# Patient Record
Sex: Female | Born: 1945 | Race: Black or African American | Hispanic: No | Smoking: Former smoker
Health system: Southern US, Community
[De-identification: ages and names within clinical notes are randomized; demographics above are authoritative.]

## PROBLEM LIST (undated history)

## (undated) DIAGNOSIS — E039 Hypothyroidism, unspecified: Secondary | ICD-10-CM

## (undated) DIAGNOSIS — J40 Bronchitis, not specified as acute or chronic: Secondary | ICD-10-CM

## (undated) DIAGNOSIS — F419 Anxiety disorder, unspecified: Secondary | ICD-10-CM

## (undated) DIAGNOSIS — K759 Inflammatory liver disease, unspecified: Secondary | ICD-10-CM

## (undated) DIAGNOSIS — M1990 Unspecified osteoarthritis, unspecified site: Secondary | ICD-10-CM

## (undated) DIAGNOSIS — IMO0001 Reserved for inherently not codable concepts without codable children: Secondary | ICD-10-CM

## (undated) DIAGNOSIS — Z8719 Personal history of other diseases of the digestive system: Secondary | ICD-10-CM

## (undated) DIAGNOSIS — R011 Cardiac murmur, unspecified: Secondary | ICD-10-CM

## (undated) DIAGNOSIS — J189 Pneumonia, unspecified organism: Secondary | ICD-10-CM

## (undated) DIAGNOSIS — R519 Headache, unspecified: Secondary | ICD-10-CM

## (undated) DIAGNOSIS — D649 Anemia, unspecified: Secondary | ICD-10-CM

## (undated) DIAGNOSIS — K219 Gastro-esophageal reflux disease without esophagitis: Secondary | ICD-10-CM

## (undated) DIAGNOSIS — I10 Essential (primary) hypertension: Secondary | ICD-10-CM

## (undated) DIAGNOSIS — Z5189 Encounter for other specified aftercare: Secondary | ICD-10-CM

## (undated) DIAGNOSIS — N189 Chronic kidney disease, unspecified: Secondary | ICD-10-CM

## (undated) HISTORY — PX: HERNIA REPAIR: SHX51

## (undated) HISTORY — PX: TONSILLECTOMY: SUR1361

## (undated) HISTORY — PX: BREAST SURGERY: SHX581

## (undated) HISTORY — PX: FRACTURE SURGERY: SHX138

## (undated) HISTORY — PX: BUNIONECTOMY: SHX129

## (undated) HISTORY — PX: OTHER SURGICAL HISTORY: SHX169

## (undated) HISTORY — PX: DILATION AND CURETTAGE OF UTERUS: SHX78

## (undated) HISTORY — PX: NASAL SEPTUM SURGERY: SHX37

## (undated) HISTORY — PX: APPENDECTOMY: SHX54

## (undated) HISTORY — PX: BREAST CYST EXCISION: SHX579

## (undated) HISTORY — PX: CHOLECYSTECTOMY: SHX55

## (undated) HISTORY — PX: BREAST CYST INCISION AND DRAINAGE: SHX14

## (undated) HISTORY — PX: BACK SURGERY: SHX140

## (undated) HISTORY — PX: OVARIAN CYST SURGERY: SHX726

## (undated) HISTORY — PX: ABDOMINAL HYSTERECTOMY: SHX81

---

## 1998-01-21 ENCOUNTER — Encounter
Admission: RE | Admit: 1998-01-21 | Discharge: 1998-04-21 | Payer: Self-pay | Admitting: Physical Medicine and Rehabilitation

## 1998-02-18 ENCOUNTER — Ambulatory Visit (HOSPITAL_COMMUNITY)
Admission: RE | Admit: 1998-02-18 | Discharge: 1998-02-18 | Payer: Self-pay | Admitting: Physical Medicine and Rehabilitation

## 1998-02-20 ENCOUNTER — Ambulatory Visit (HOSPITAL_COMMUNITY)
Admission: RE | Admit: 1998-02-20 | Discharge: 1998-02-20 | Payer: Self-pay | Admitting: Physical Medicine and Rehabilitation

## 1998-02-25 ENCOUNTER — Ambulatory Visit (HOSPITAL_COMMUNITY)
Admission: RE | Admit: 1998-02-25 | Discharge: 1998-02-25 | Payer: Self-pay | Admitting: Physical Medicine and Rehabilitation

## 1998-02-27 ENCOUNTER — Ambulatory Visit (HOSPITAL_BASED_OUTPATIENT_CLINIC_OR_DEPARTMENT_OTHER): Admission: RE | Admit: 1998-02-27 | Discharge: 1998-02-27 | Payer: Self-pay | Admitting: General Surgery

## 1998-05-28 ENCOUNTER — Ambulatory Visit (HOSPITAL_COMMUNITY)
Admission: RE | Admit: 1998-05-28 | Discharge: 1998-05-28 | Payer: Self-pay | Admitting: Physical Medicine and Rehabilitation

## 1998-11-01 ENCOUNTER — Encounter: Payer: Self-pay | Admitting: Physical Medicine and Rehabilitation

## 1998-11-01 ENCOUNTER — Ambulatory Visit (HOSPITAL_COMMUNITY)
Admission: RE | Admit: 1998-11-01 | Discharge: 1998-11-01 | Payer: Self-pay | Admitting: Physical Medicine and Rehabilitation

## 1998-11-29 ENCOUNTER — Inpatient Hospital Stay (HOSPITAL_COMMUNITY): Admission: RE | Admit: 1998-11-29 | Discharge: 1998-12-01 | Payer: Self-pay | Admitting: Obstetrics and Gynecology

## 1999-05-22 ENCOUNTER — Ambulatory Visit (HOSPITAL_COMMUNITY)
Admission: RE | Admit: 1999-05-22 | Discharge: 1999-05-22 | Payer: Self-pay | Admitting: Physical Medicine and Rehabilitation

## 1999-05-22 ENCOUNTER — Encounter: Payer: Self-pay | Admitting: Physical Medicine and Rehabilitation

## 1999-11-23 ENCOUNTER — Emergency Department (HOSPITAL_COMMUNITY): Admission: EM | Admit: 1999-11-23 | Discharge: 1999-11-23 | Payer: Self-pay | Admitting: Emergency Medicine

## 1999-11-23 ENCOUNTER — Encounter: Payer: Self-pay | Admitting: Emergency Medicine

## 2002-12-27 ENCOUNTER — Other Ambulatory Visit: Admission: RE | Admit: 2002-12-27 | Discharge: 2002-12-27 | Payer: Self-pay | Admitting: Obstetrics and Gynecology

## 2003-07-30 ENCOUNTER — Encounter: Payer: Self-pay | Admitting: Emergency Medicine

## 2003-07-30 ENCOUNTER — Observation Stay (HOSPITAL_COMMUNITY): Admission: EM | Admit: 2003-07-30 | Discharge: 2003-08-01 | Payer: Self-pay

## 2003-07-31 ENCOUNTER — Encounter: Payer: Self-pay | Admitting: Cardiology

## 2004-11-19 ENCOUNTER — Other Ambulatory Visit: Admission: RE | Admit: 2004-11-19 | Discharge: 2004-11-19 | Payer: Self-pay | Admitting: Obstetrics and Gynecology

## 2005-01-20 ENCOUNTER — Emergency Department (HOSPITAL_COMMUNITY): Admission: EM | Admit: 2005-01-20 | Discharge: 2005-01-20 | Payer: Self-pay | Admitting: Emergency Medicine

## 2005-01-22 ENCOUNTER — Ambulatory Visit (HOSPITAL_COMMUNITY): Admission: RE | Admit: 2005-01-22 | Discharge: 2005-01-22 | Payer: Self-pay | Admitting: Gastroenterology

## 2005-01-22 ENCOUNTER — Encounter (INDEPENDENT_AMBULATORY_CARE_PROVIDER_SITE_OTHER): Payer: Self-pay | Admitting: Specialist

## 2005-02-10 ENCOUNTER — Ambulatory Visit (HOSPITAL_COMMUNITY): Admission: RE | Admit: 2005-02-10 | Discharge: 2005-02-10 | Payer: Self-pay | Admitting: Gastroenterology

## 2005-02-24 ENCOUNTER — Encounter: Admission: RE | Admit: 2005-02-24 | Discharge: 2005-02-24 | Payer: Self-pay | Admitting: Nephrology

## 2007-09-16 ENCOUNTER — Encounter: Admission: RE | Admit: 2007-09-16 | Discharge: 2007-09-16 | Payer: Self-pay | Admitting: Obstetrics and Gynecology

## 2007-09-29 ENCOUNTER — Encounter: Admission: RE | Admit: 2007-09-29 | Discharge: 2007-09-29 | Payer: Self-pay | Admitting: Obstetrics and Gynecology

## 2007-12-18 ENCOUNTER — Ambulatory Visit (HOSPITAL_COMMUNITY): Admission: EM | Admit: 2007-12-18 | Discharge: 2007-12-18 | Payer: Self-pay | Admitting: Emergency Medicine

## 2007-12-31 ENCOUNTER — Emergency Department (HOSPITAL_COMMUNITY): Admission: EM | Admit: 2007-12-31 | Discharge: 2007-12-31 | Payer: Self-pay | Admitting: Emergency Medicine

## 2008-02-29 ENCOUNTER — Ambulatory Visit (HOSPITAL_COMMUNITY): Admission: RE | Admit: 2008-02-29 | Discharge: 2008-02-29 | Payer: Self-pay | Admitting: Nephrology

## 2008-03-09 ENCOUNTER — Encounter (HOSPITAL_COMMUNITY): Admission: RE | Admit: 2008-03-09 | Discharge: 2008-05-21 | Payer: Self-pay | Admitting: Nephrology

## 2008-04-27 ENCOUNTER — Encounter: Admission: RE | Admit: 2008-04-27 | Discharge: 2008-04-27 | Payer: Self-pay | Admitting: General Surgery

## 2008-12-13 ENCOUNTER — Inpatient Hospital Stay (HOSPITAL_COMMUNITY): Admission: AD | Admit: 2008-12-13 | Discharge: 2008-12-30 | Payer: Self-pay | Admitting: Surgery

## 2008-12-13 ENCOUNTER — Encounter: Admission: RE | Admit: 2008-12-13 | Discharge: 2008-12-13 | Payer: Self-pay | Admitting: Internal Medicine

## 2008-12-13 ENCOUNTER — Encounter (INDEPENDENT_AMBULATORY_CARE_PROVIDER_SITE_OTHER): Payer: Self-pay | Admitting: Surgery

## 2008-12-31 ENCOUNTER — Ambulatory Visit: Payer: Self-pay | Admitting: Vascular Surgery

## 2009-01-12 ENCOUNTER — Inpatient Hospital Stay (HOSPITAL_COMMUNITY): Admission: EM | Admit: 2009-01-12 | Discharge: 2009-01-20 | Payer: Self-pay | Admitting: Emergency Medicine

## 2009-03-21 ENCOUNTER — Encounter: Admission: RE | Admit: 2009-03-21 | Discharge: 2009-03-21 | Payer: Self-pay | Admitting: Orthopaedic Surgery

## 2009-06-10 ENCOUNTER — Encounter (HOSPITAL_COMMUNITY): Admission: RE | Admit: 2009-06-10 | Discharge: 2009-07-09 | Payer: Self-pay | Admitting: Nephrology

## 2009-09-23 ENCOUNTER — Ambulatory Visit: Admission: AD | Admit: 2009-09-23 | Discharge: 2009-09-23 | Payer: Self-pay | Admitting: Internal Medicine

## 2010-08-22 ENCOUNTER — Encounter: Admission: RE | Admit: 2010-08-22 | Discharge: 2010-08-22 | Payer: Self-pay | Admitting: Specialist

## 2010-08-25 ENCOUNTER — Inpatient Hospital Stay (HOSPITAL_COMMUNITY): Admission: RE | Admit: 2010-08-25 | Discharge: 2010-08-30 | Payer: Self-pay | Admitting: Specialist

## 2010-09-09 ENCOUNTER — Inpatient Hospital Stay (HOSPITAL_COMMUNITY)
Admission: EM | Admit: 2010-09-09 | Discharge: 2010-09-16 | Payer: Self-pay | Source: Home / Self Care | Admitting: Emergency Medicine

## 2010-09-16 ENCOUNTER — Encounter (INDEPENDENT_AMBULATORY_CARE_PROVIDER_SITE_OTHER): Payer: Self-pay | Admitting: Specialist

## 2010-10-07 ENCOUNTER — Ambulatory Visit: Admit: 2010-10-07 | Payer: Self-pay | Admitting: Vascular Surgery

## 2010-10-07 ENCOUNTER — Ambulatory Visit (HOSPITAL_COMMUNITY)
Admission: RE | Admit: 2010-10-07 | Discharge: 2010-10-07 | Payer: Self-pay | Source: Home / Self Care | Attending: Internal Medicine | Admitting: Internal Medicine

## 2010-10-07 ENCOUNTER — Encounter (INDEPENDENT_AMBULATORY_CARE_PROVIDER_SITE_OTHER): Payer: Self-pay | Admitting: Internal Medicine

## 2010-11-05 ENCOUNTER — Emergency Department (HOSPITAL_COMMUNITY)
Admission: EM | Admit: 2010-11-05 | Discharge: 2010-11-06 | Payer: Self-pay | Source: Home / Self Care | Admitting: Emergency Medicine

## 2010-11-06 LAB — DIFFERENTIAL
Basophils Absolute: 0 10*3/uL (ref 0.0–0.1)
Basophils Relative: 0 % (ref 0–1)
Eosinophils Absolute: 0.1 10*3/uL (ref 0.0–0.7)
Eosinophils Relative: 1 % (ref 0–5)
Lymphocytes Relative: 20 % (ref 12–46)
Lymphs Abs: 2.4 10*3/uL (ref 0.7–4.0)
Monocytes Absolute: 0.8 10*3/uL (ref 0.1–1.0)
Monocytes Relative: 7 % (ref 3–12)
Neutro Abs: 8.6 10*3/uL — ABNORMAL HIGH (ref 1.7–7.7)
Neutrophils Relative %: 72 % (ref 43–77)

## 2010-11-06 LAB — CBC
HCT: 34.2 % — ABNORMAL LOW (ref 36.0–46.0)
Hemoglobin: 11.5 g/dL — ABNORMAL LOW (ref 12.0–15.0)
MCH: 28.4 pg (ref 26.0–34.0)
MCHC: 33.6 g/dL (ref 30.0–36.0)
MCV: 84.4 fL (ref 78.0–100.0)
Platelets: 323 10*3/uL (ref 150–400)
RBC: 4.05 MIL/uL (ref 3.87–5.11)
RDW: 14.3 % (ref 11.5–15.5)
WBC: 11.9 10*3/uL — ABNORMAL HIGH (ref 4.0–10.5)

## 2010-11-06 LAB — COMPREHENSIVE METABOLIC PANEL
ALT: 13 U/L (ref 0–35)
AST: 29 U/L (ref 0–37)
Albumin: 3.3 g/dL — ABNORMAL LOW (ref 3.5–5.2)
Alkaline Phosphatase: 87 U/L (ref 39–117)
BUN: 10 mg/dL (ref 6–23)
CO2: 27 mEq/L (ref 19–32)
Calcium: 8.7 mg/dL (ref 8.4–10.5)
Chloride: 99 mEq/L (ref 96–112)
Creatinine, Ser: 1.12 mg/dL (ref 0.4–1.2)
GFR calc Af Amer: 60 mL/min — ABNORMAL LOW (ref >60)
GFR calc non Af Amer: 49 mL/min — ABNORMAL LOW (ref >60)
Glucose, Bld: 123 mg/dL — ABNORMAL HIGH (ref 70–99)
Potassium: 3.2 mEq/L — ABNORMAL LOW (ref 3.5–5.1)
Sodium: 137 mEq/L (ref 135–145)
Total Bilirubin: 0.9 mg/dL (ref 0.3–1.2)
Total Protein: 7 g/dL (ref 6.0–8.3)

## 2010-11-06 LAB — URINALYSIS, ROUTINE W REFLEX MICROSCOPIC
Bilirubin Urine: NEGATIVE
Hgb urine dipstick: NEGATIVE
Ketones, ur: NEGATIVE mg/dL
Nitrite: NEGATIVE
Protein, ur: NEGATIVE mg/dL
Specific Gravity, Urine: 1.011 (ref 1.005–1.030)
Urine Glucose, Fasting: NEGATIVE mg/dL
Urobilinogen, UA: 0.2 mg/dL (ref 0.0–1.0)
pH: 8 (ref 5.0–8.0)

## 2010-11-06 LAB — URINE MICROSCOPIC-ADD ON

## 2010-11-06 LAB — LIPASE, BLOOD: Lipase: 68 U/L — ABNORMAL HIGH (ref 11–59)

## 2010-12-23 LAB — GLUCOSE, CAPILLARY
Glucose-Capillary: 101 mg/dL — ABNORMAL HIGH (ref 70–99)
Glucose-Capillary: 101 mg/dL — ABNORMAL HIGH (ref 70–99)
Glucose-Capillary: 102 mg/dL — ABNORMAL HIGH (ref 70–99)
Glucose-Capillary: 102 mg/dL — ABNORMAL HIGH (ref 70–99)
Glucose-Capillary: 105 mg/dL — ABNORMAL HIGH (ref 70–99)
Glucose-Capillary: 110 mg/dL — ABNORMAL HIGH (ref 70–99)
Glucose-Capillary: 113 mg/dL — ABNORMAL HIGH (ref 70–99)
Glucose-Capillary: 114 mg/dL — ABNORMAL HIGH (ref 70–99)
Glucose-Capillary: 115 mg/dL — ABNORMAL HIGH (ref 70–99)
Glucose-Capillary: 116 mg/dL — ABNORMAL HIGH (ref 70–99)
Glucose-Capillary: 116 mg/dL — ABNORMAL HIGH (ref 70–99)
Glucose-Capillary: 120 mg/dL — ABNORMAL HIGH (ref 70–99)
Glucose-Capillary: 121 mg/dL — ABNORMAL HIGH (ref 70–99)
Glucose-Capillary: 125 mg/dL — ABNORMAL HIGH (ref 70–99)
Glucose-Capillary: 128 mg/dL — ABNORMAL HIGH (ref 70–99)
Glucose-Capillary: 132 mg/dL — ABNORMAL HIGH (ref 70–99)
Glucose-Capillary: 133 mg/dL — ABNORMAL HIGH (ref 70–99)
Glucose-Capillary: 141 mg/dL — ABNORMAL HIGH (ref 70–99)
Glucose-Capillary: 147 mg/dL — ABNORMAL HIGH (ref 70–99)
Glucose-Capillary: 181 mg/dL — ABNORMAL HIGH (ref 70–99)
Glucose-Capillary: 99 mg/dL (ref 70–99)

## 2010-12-23 LAB — BASIC METABOLIC PANEL
BUN: 5 mg/dL — ABNORMAL LOW (ref 6–23)
BUN: 5 mg/dL — ABNORMAL LOW (ref 6–23)
CO2: 22 mEq/L (ref 19–32)
CO2: 24 mEq/L (ref 19–32)
Calcium: 8.4 mg/dL (ref 8.4–10.5)
Calcium: 9.2 mg/dL (ref 8.4–10.5)
Chloride: 102 mEq/L (ref 96–112)
Chloride: 109 mEq/L (ref 96–112)
Creatinine, Ser: 1.27 mg/dL — ABNORMAL HIGH (ref 0.4–1.2)
Creatinine, Ser: 1.31 mg/dL — ABNORMAL HIGH (ref 0.4–1.2)
GFR calc Af Amer: 50 mL/min — ABNORMAL LOW (ref >60)
GFR calc Af Amer: 52 mL/min — ABNORMAL LOW (ref >60)
GFR calc non Af Amer: 41 mL/min — ABNORMAL LOW (ref >60)
GFR calc non Af Amer: 43 mL/min — ABNORMAL LOW (ref >60)
Glucose, Bld: 104 mg/dL — ABNORMAL HIGH (ref 70–99)
Glucose, Bld: 131 mg/dL — ABNORMAL HIGH (ref 70–99)
Potassium: 4 mEq/L (ref 3.5–5.1)
Potassium: 4.2 mEq/L (ref 3.5–5.1)
Sodium: 135 mEq/L (ref 135–145)
Sodium: 135 mEq/L (ref 135–145)

## 2010-12-23 LAB — URINE MICROSCOPIC-ADD ON

## 2010-12-23 LAB — URINALYSIS, ROUTINE W REFLEX MICROSCOPIC
Bilirubin Urine: NEGATIVE
Glucose, UA: NEGATIVE mg/dL
Ketones, ur: NEGATIVE mg/dL
Nitrite: NEGATIVE
Protein, ur: NEGATIVE mg/dL
Specific Gravity, Urine: 1.006 (ref 1.005–1.030)
Urobilinogen, UA: 0.2 mg/dL (ref 0.0–1.0)
pH: 7.5 (ref 5.0–8.0)

## 2010-12-23 LAB — ANAEROBIC CULTURE

## 2010-12-23 LAB — WOUND CULTURE: Culture: NO GROWTH

## 2010-12-23 LAB — CBC
HCT: 30.3 % — ABNORMAL LOW (ref 36.0–46.0)
HCT: 34 % — ABNORMAL LOW (ref 36.0–46.0)
Hemoglobin: 10.9 g/dL — ABNORMAL LOW (ref 12.0–15.0)
Hemoglobin: 9.7 g/dL — ABNORMAL LOW (ref 12.0–15.0)
MCH: 28.8 pg (ref 26.0–34.0)
MCH: 29.3 pg (ref 26.0–34.0)
MCHC: 32 g/dL (ref 30.0–36.0)
MCHC: 32.1 g/dL (ref 30.0–36.0)
MCV: 89.7 fL (ref 78.0–100.0)
MCV: 91.5 fL (ref 78.0–100.0)
Platelets: 354 10*3/uL (ref 150–400)
Platelets: 390 10*3/uL (ref 150–400)
RBC: 3.31 MIL/uL — ABNORMAL LOW (ref 3.87–5.11)
RBC: 3.79 MIL/uL — ABNORMAL LOW (ref 3.87–5.11)
RDW: 14 % (ref 11.5–15.5)
RDW: 14.6 % (ref 11.5–15.5)
WBC: 10.6 10*3/uL — ABNORMAL HIGH (ref 4.0–10.5)
WBC: 9.8 10*3/uL (ref 4.0–10.5)

## 2010-12-24 LAB — URINE CULTURE
Colony Count: >=100000
Culture  Setup Time: 201111181817

## 2010-12-24 LAB — GLUCOSE, CAPILLARY
Glucose-Capillary: 101 mg/dL — ABNORMAL HIGH (ref 70–99)
Glucose-Capillary: 106 mg/dL — ABNORMAL HIGH (ref 70–99)
Glucose-Capillary: 106 mg/dL — ABNORMAL HIGH (ref 70–99)
Glucose-Capillary: 111 mg/dL — ABNORMAL HIGH (ref 70–99)
Glucose-Capillary: 112 mg/dL — ABNORMAL HIGH (ref 70–99)
Glucose-Capillary: 114 mg/dL — ABNORMAL HIGH (ref 70–99)
Glucose-Capillary: 114 mg/dL — ABNORMAL HIGH (ref 70–99)
Glucose-Capillary: 114 mg/dL — ABNORMAL HIGH (ref 70–99)
Glucose-Capillary: 123 mg/dL — ABNORMAL HIGH (ref 70–99)
Glucose-Capillary: 127 mg/dL — ABNORMAL HIGH (ref 70–99)
Glucose-Capillary: 127 mg/dL — ABNORMAL HIGH (ref 70–99)
Glucose-Capillary: 128 mg/dL — ABNORMAL HIGH (ref 70–99)
Glucose-Capillary: 133 mg/dL — ABNORMAL HIGH (ref 70–99)
Glucose-Capillary: 137 mg/dL — ABNORMAL HIGH (ref 70–99)
Glucose-Capillary: 139 mg/dL — ABNORMAL HIGH (ref 70–99)
Glucose-Capillary: 139 mg/dL — ABNORMAL HIGH (ref 70–99)
Glucose-Capillary: 139 mg/dL — ABNORMAL HIGH (ref 70–99)
Glucose-Capillary: 143 mg/dL — ABNORMAL HIGH (ref 70–99)
Glucose-Capillary: 147 mg/dL — ABNORMAL HIGH (ref 70–99)
Glucose-Capillary: 153 mg/dL — ABNORMAL HIGH (ref 70–99)
Glucose-Capillary: 154 mg/dL — ABNORMAL HIGH (ref 70–99)
Glucose-Capillary: 155 mg/dL — ABNORMAL HIGH (ref 70–99)
Glucose-Capillary: 156 mg/dL — ABNORMAL HIGH (ref 70–99)
Glucose-Capillary: 157 mg/dL — ABNORMAL HIGH (ref 70–99)
Glucose-Capillary: 93 mg/dL (ref 70–99)
Glucose-Capillary: 99 mg/dL (ref 70–99)

## 2010-12-24 LAB — CBC
HCT: 26.7 % — ABNORMAL LOW (ref 36.0–46.0)
HCT: 27.1 % — ABNORMAL LOW (ref 36.0–46.0)
HCT: 28 % — ABNORMAL LOW (ref 36.0–46.0)
HCT: 30.8 % — ABNORMAL LOW (ref 36.0–46.0)
HCT: 36.9 % (ref 36.0–46.0)
HCT: 38.8 % (ref 36.0–46.0)
Hemoglobin: 12 g/dL (ref 12.0–15.0)
Hemoglobin: 12.8 g/dL (ref 12.0–15.0)
Hemoglobin: 8.5 g/dL — ABNORMAL LOW (ref 12.0–15.0)
Hemoglobin: 8.7 g/dL — ABNORMAL LOW (ref 12.0–15.0)
Hemoglobin: 9.1 g/dL — ABNORMAL LOW (ref 12.0–15.0)
Hemoglobin: 9.9 g/dL — ABNORMAL LOW (ref 12.0–15.0)
MCH: 29 pg (ref 26.0–34.0)
MCH: 29 pg (ref 26.0–34.0)
MCH: 29.1 pg (ref 26.0–34.0)
MCH: 29.3 pg (ref 26.0–34.0)
MCH: 29.6 pg (ref 26.0–34.0)
MCH: 29.9 pg (ref 26.0–34.0)
MCHC: 31.8 g/dL (ref 30.0–36.0)
MCHC: 32.1 g/dL (ref 30.0–36.0)
MCHC: 32.1 g/dL (ref 30.0–36.0)
MCHC: 32.5 g/dL (ref 30.0–36.0)
MCHC: 32.5 g/dL (ref 30.0–36.0)
MCHC: 33 g/dL (ref 30.0–36.0)
MCV: 89.6 fL (ref 78.0–100.0)
MCV: 90.2 fL (ref 78.0–100.0)
MCV: 90.3 fL (ref 78.0–100.0)
MCV: 90.6 fL (ref 78.0–100.0)
MCV: 91.1 fL (ref 78.0–100.0)
MCV: 92.1 fL (ref 78.0–100.0)
Platelets: 170 10*3/uL (ref 150–400)
Platelets: 218 10*3/uL (ref 150–400)
Platelets: 223 10*3/uL (ref 150–400)
Platelets: 299 10*3/uL (ref 150–400)
Platelets: 308 10*3/uL (ref 150–400)
Platelets: 417 10*3/uL — ABNORMAL HIGH (ref 150–400)
RBC: 2.93 MIL/uL — ABNORMAL LOW (ref 3.87–5.11)
RBC: 3 MIL/uL — ABNORMAL LOW (ref 3.87–5.11)
RBC: 3.04 MIL/uL — ABNORMAL LOW (ref 3.87–5.11)
RBC: 3.4 MIL/uL — ABNORMAL LOW (ref 3.87–5.11)
RBC: 4.09 MIL/uL (ref 3.87–5.11)
RBC: 4.33 MIL/uL (ref 3.87–5.11)
RDW: 14.3 % (ref 11.5–15.5)
RDW: 14.5 % (ref 11.5–15.5)
RDW: 14.7 % (ref 11.5–15.5)
RDW: 14.7 % (ref 11.5–15.5)
RDW: 14.9 % (ref 11.5–15.5)
RDW: 15 % (ref 11.5–15.5)
WBC: 12.6 10*3/uL — ABNORMAL HIGH (ref 4.0–10.5)
WBC: 13.5 10*3/uL — ABNORMAL HIGH (ref 4.0–10.5)
WBC: 13.9 10*3/uL — ABNORMAL HIGH (ref 4.0–10.5)
WBC: 14.6 10*3/uL — ABNORMAL HIGH (ref 4.0–10.5)
WBC: 8.9 10*3/uL (ref 4.0–10.5)
WBC: 9.3 10*3/uL (ref 4.0–10.5)

## 2010-12-24 LAB — HEMOGLOBIN A1C
Hgb A1c MFr Bld: 6.8 % — ABNORMAL HIGH (ref <5.7)
Hgb A1c MFr Bld: 6.8 % — ABNORMAL HIGH (ref <5.7)
Mean Plasma Glucose: 148 mg/dL — ABNORMAL HIGH (ref <117)
Mean Plasma Glucose: 148 mg/dL — ABNORMAL HIGH (ref <117)

## 2010-12-24 LAB — DIFFERENTIAL
Basophils Absolute: 0 10*3/uL (ref 0.0–0.1)
Basophils Absolute: 0 10*3/uL (ref 0.0–0.1)
Basophils Relative: 0 % (ref 0–1)
Basophils Relative: 0 % (ref 0–1)
Eosinophils Absolute: 0.3 10*3/uL (ref 0.0–0.7)
Eosinophils Absolute: 1.6 10*3/uL — ABNORMAL HIGH (ref 0.0–0.7)
Eosinophils Relative: 12 % — ABNORMAL HIGH (ref 0–5)
Eosinophils Relative: 2 % (ref 0–5)
Lymphocytes Relative: 16 % (ref 12–46)
Lymphocytes Relative: 26 % (ref 12–46)
Lymphs Abs: 2.4 10*3/uL (ref 0.7–4.0)
Lymphs Abs: 3.6 10*3/uL (ref 0.7–4.0)
Monocytes Absolute: 1.2 10*3/uL — ABNORMAL HIGH (ref 0.1–1.0)
Monocytes Absolute: 1.2 10*3/uL — ABNORMAL HIGH (ref 0.1–1.0)
Monocytes Relative: 8 % (ref 3–12)
Monocytes Relative: 9 % (ref 3–12)
Neutro Abs: 10.8 10*3/uL — ABNORMAL HIGH (ref 1.7–7.7)
Neutro Abs: 7.1 10*3/uL (ref 1.7–7.7)
Neutrophils Relative %: 53 % (ref 43–77)
Neutrophils Relative %: 74 % (ref 43–77)

## 2010-12-24 LAB — BASIC METABOLIC PANEL
BUN: 15 mg/dL (ref 6–23)
BUN: 20 mg/dL (ref 6–23)
BUN: 22 mg/dL (ref 6–23)
BUN: 25 mg/dL — ABNORMAL HIGH (ref 6–23)
CO2: 23 mEq/L (ref 19–32)
CO2: 24 mEq/L (ref 19–32)
CO2: 27 mEq/L (ref 19–32)
CO2: 28 mEq/L (ref 19–32)
Calcium: 7.8 mg/dL — ABNORMAL LOW (ref 8.4–10.5)
Calcium: 7.8 mg/dL — ABNORMAL LOW (ref 8.4–10.5)
Calcium: 8 mg/dL — ABNORMAL LOW (ref 8.4–10.5)
Calcium: 8.1 mg/dL — ABNORMAL LOW (ref 8.4–10.5)
Chloride: 104 mEq/L (ref 96–112)
Chloride: 105 mEq/L (ref 96–112)
Chloride: 105 mEq/L (ref 96–112)
Chloride: 106 mEq/L (ref 96–112)
Creatinine, Ser: 1.07 mg/dL (ref 0.4–1.2)
Creatinine, Ser: 1.11 mg/dL (ref 0.4–1.2)
Creatinine, Ser: 1.3 mg/dL — ABNORMAL HIGH (ref 0.4–1.2)
Creatinine, Ser: 1.31 mg/dL — ABNORMAL HIGH (ref 0.4–1.2)
GFR calc Af Amer: 50 mL/min — ABNORMAL LOW (ref >60)
GFR calc Af Amer: 50 mL/min — ABNORMAL LOW (ref >60)
GFR calc Af Amer: >60 mL/min (ref >60)
GFR calc Af Amer: >60 mL/min (ref >60)
GFR calc non Af Amer: 41 mL/min — ABNORMAL LOW (ref >60)
GFR calc non Af Amer: 42 mL/min — ABNORMAL LOW (ref >60)
GFR calc non Af Amer: 50 mL/min — ABNORMAL LOW (ref >60)
GFR calc non Af Amer: 52 mL/min — ABNORMAL LOW (ref >60)
Glucose, Bld: 141 mg/dL — ABNORMAL HIGH (ref 70–99)
Glucose, Bld: 142 mg/dL — ABNORMAL HIGH (ref 70–99)
Glucose, Bld: 147 mg/dL — ABNORMAL HIGH (ref 70–99)
Glucose, Bld: 158 mg/dL — ABNORMAL HIGH (ref 70–99)
Potassium: 4 mEq/L (ref 3.5–5.1)
Potassium: 4 mEq/L (ref 3.5–5.1)
Potassium: 4.3 mEq/L (ref 3.5–5.1)
Potassium: 4.8 mEq/L (ref 3.5–5.1)
Sodium: 133 mEq/L — ABNORMAL LOW (ref 135–145)
Sodium: 135 mEq/L (ref 135–145)
Sodium: 135 mEq/L (ref 135–145)
Sodium: 136 mEq/L (ref 135–145)

## 2010-12-24 LAB — TYPE AND SCREEN
ABO/RH(D): B POS
Antibody Screen: NEGATIVE

## 2010-12-24 LAB — URINALYSIS, MICROSCOPIC ONLY
Bilirubin Urine: NEGATIVE
Glucose, UA: NEGATIVE mg/dL
Ketones, ur: NEGATIVE mg/dL
Nitrite: POSITIVE — AB
Protein, ur: NEGATIVE mg/dL
Specific Gravity, Urine: 1.011 (ref 1.005–1.030)
Urobilinogen, UA: 1 mg/dL (ref 0.0–1.0)
pH: 6.5 (ref 5.0–8.0)

## 2010-12-24 LAB — URINALYSIS, ROUTINE W REFLEX MICROSCOPIC
Bilirubin Urine: NEGATIVE
Bilirubin Urine: NEGATIVE
Glucose, UA: NEGATIVE mg/dL
Glucose, UA: NEGATIVE mg/dL
Hgb urine dipstick: NEGATIVE
Hgb urine dipstick: NEGATIVE
Ketones, ur: NEGATIVE mg/dL
Ketones, ur: NEGATIVE mg/dL
Nitrite: NEGATIVE
Nitrite: NEGATIVE
Protein, ur: NEGATIVE mg/dL
Protein, ur: NEGATIVE mg/dL
Specific Gravity, Urine: 1.009 (ref 1.005–1.030)
Specific Gravity, Urine: 1.017 (ref 1.005–1.030)
Urobilinogen, UA: 0.2 mg/dL (ref 0.0–1.0)
Urobilinogen, UA: 0.2 mg/dL (ref 0.0–1.0)
pH: 6.5 (ref 5.0–8.0)
pH: 6.5 (ref 5.0–8.0)

## 2010-12-24 LAB — COMPREHENSIVE METABOLIC PANEL
ALT: 23 U/L (ref 0–35)
ALT: 47 U/L — ABNORMAL HIGH (ref 0–35)
AST: 26 U/L (ref 0–37)
AST: 38 U/L — ABNORMAL HIGH (ref 0–37)
Albumin: 2.5 g/dL — ABNORMAL LOW (ref 3.5–5.2)
Albumin: 2.8 g/dL — ABNORMAL LOW (ref 3.5–5.2)
Alkaline Phosphatase: 108 U/L (ref 39–117)
Alkaline Phosphatase: 80 U/L (ref 39–117)
BUN: 32 mg/dL — ABNORMAL HIGH (ref 6–23)
BUN: 9 mg/dL (ref 6–23)
CO2: 22 mEq/L (ref 19–32)
CO2: 31 mEq/L (ref 19–32)
Calcium: 8.4 mg/dL (ref 8.4–10.5)
Calcium: 9.1 mg/dL (ref 8.4–10.5)
Chloride: 103 mEq/L (ref 96–112)
Chloride: 99 mEq/L (ref 96–112)
Creatinine, Ser: 1.45 mg/dL — ABNORMAL HIGH (ref 0.4–1.2)
Creatinine, Ser: 1.78 mg/dL — ABNORMAL HIGH (ref 0.4–1.2)
GFR calc Af Amer: 35 mL/min — ABNORMAL LOW (ref >60)
GFR calc Af Amer: 44 mL/min — ABNORMAL LOW (ref >60)
GFR calc non Af Amer: 29 mL/min — ABNORMAL LOW (ref >60)
GFR calc non Af Amer: 37 mL/min — ABNORMAL LOW (ref >60)
Glucose, Bld: 122 mg/dL — ABNORMAL HIGH (ref 70–99)
Glucose, Bld: 123 mg/dL — ABNORMAL HIGH (ref 70–99)
Potassium: 3.3 mEq/L — ABNORMAL LOW (ref 3.5–5.1)
Potassium: 3.7 mEq/L (ref 3.5–5.1)
Sodium: 134 mEq/L — ABNORMAL LOW (ref 135–145)
Sodium: 136 mEq/L (ref 135–145)
Total Bilirubin: 0.5 mg/dL (ref 0.3–1.2)
Total Bilirubin: 0.5 mg/dL (ref 0.3–1.2)
Total Protein: 5.7 g/dL — ABNORMAL LOW (ref 6.0–8.3)
Total Protein: 6.6 g/dL (ref 6.0–8.3)

## 2010-12-24 LAB — PROTIME-INR
INR: 0.94 (ref 0.00–1.49)
INR: 1.06 (ref 0.00–1.49)
Prothrombin Time: 12.8 seconds (ref 11.6–15.2)
Prothrombin Time: 14 seconds (ref 11.6–15.2)

## 2010-12-24 LAB — POCT I-STAT 4, (NA,K, GLUC, HGB,HCT)
Glucose, Bld: 105 mg/dL — ABNORMAL HIGH (ref 70–99)
HCT: 28 % — ABNORMAL LOW (ref 36.0–46.0)
Hemoglobin: 9.5 g/dL — ABNORMAL LOW (ref 12.0–15.0)
Potassium: 3.8 mEq/L (ref 3.5–5.1)
Sodium: 137 mEq/L (ref 135–145)

## 2010-12-24 LAB — APTT
aPTT: 25 seconds (ref 24–37)
aPTT: 28 seconds (ref 24–37)

## 2010-12-24 LAB — C-REACTIVE PROTEIN: CRP: 0.9 mg/dL — ABNORMAL HIGH (ref <0.6)

## 2010-12-24 LAB — HEMOGLOBIN AND HEMATOCRIT, BLOOD
HCT: 29 % — ABNORMAL LOW (ref 36.0–46.0)
Hemoglobin: 9.2 g/dL — ABNORMAL LOW (ref 12.0–15.0)

## 2010-12-24 LAB — SEDIMENTATION RATE: Sed Rate: 56 mm/hr — ABNORMAL HIGH (ref 0–22)

## 2010-12-24 LAB — ABO/RH: ABO/RH(D): B POS

## 2011-01-13 LAB — BASIC METABOLIC PANEL
BUN: 12 mg/dL (ref 6–23)
CO2: 26 mEq/L (ref 19–32)
Calcium: 9.2 mg/dL (ref 8.4–10.5)
Chloride: 102 mEq/L (ref 96–112)
Creatinine, Ser: 1.16 mg/dL (ref 0.4–1.2)
GFR calc Af Amer: 57 mL/min — ABNORMAL LOW (ref >60)
GFR calc non Af Amer: 47 mL/min — ABNORMAL LOW (ref >60)
Glucose, Bld: 95 mg/dL (ref 70–99)
Potassium: 4.1 mEq/L (ref 3.5–5.1)
Sodium: 137 mEq/L (ref 135–145)

## 2011-01-21 LAB — BASIC METABOLIC PANEL
BUN: 5 mg/dL — ABNORMAL LOW (ref 6–23)
BUN: 7 mg/dL (ref 6–23)
CO2: 23 mEq/L (ref 19–32)
CO2: 26 mEq/L (ref 19–32)
Calcium: 9.2 mg/dL (ref 8.4–10.5)
Calcium: 9.7 mg/dL (ref 8.4–10.5)
Chloride: 105 mEq/L (ref 96–112)
Chloride: 108 mEq/L (ref 96–112)
Creatinine, Ser: 0.97 mg/dL (ref 0.4–1.2)
Creatinine, Ser: 0.99 mg/dL (ref 0.4–1.2)
GFR calc Af Amer: >60 mL/min (ref >60)
GFR calc Af Amer: >60 mL/min (ref >60)
GFR calc non Af Amer: 57 mL/min — ABNORMAL LOW (ref >60)
GFR calc non Af Amer: 59 mL/min — ABNORMAL LOW (ref >60)
Glucose, Bld: 122 mg/dL — ABNORMAL HIGH (ref 70–99)
Glucose, Bld: 142 mg/dL — ABNORMAL HIGH (ref 70–99)
Potassium: 3.9 mEq/L (ref 3.5–5.1)
Potassium: 4.3 mEq/L (ref 3.5–5.1)
Sodium: 138 mEq/L (ref 135–145)
Sodium: 138 mEq/L (ref 135–145)

## 2011-01-21 LAB — URINALYSIS, ROUTINE W REFLEX MICROSCOPIC
Bilirubin Urine: NEGATIVE
Bilirubin Urine: NEGATIVE
Glucose, UA: NEGATIVE mg/dL
Glucose, UA: NEGATIVE mg/dL
Hgb urine dipstick: NEGATIVE
Hgb urine dipstick: NEGATIVE
Ketones, ur: NEGATIVE mg/dL
Ketones, ur: NEGATIVE mg/dL
Leukocytes, UA: NEGATIVE
Leukocytes, UA: NEGATIVE
Nitrite: NEGATIVE
Nitrite: NEGATIVE
Protein, ur: 30 mg/dL — AB
Protein, ur: 30 mg/dL — AB
Specific Gravity, Urine: 1.006 (ref 1.005–1.030)
Specific Gravity, Urine: 1.009 (ref 1.005–1.030)
Urobilinogen, UA: 0.2 mg/dL (ref 0.0–1.0)
Urobilinogen, UA: 1 mg/dL (ref 0.0–1.0)
pH: 7.5 (ref 5.0–8.0)
pH: 7.5 (ref 5.0–8.0)

## 2011-01-21 LAB — COMPREHENSIVE METABOLIC PANEL
ALT: 27 U/L (ref 0–35)
ALT: 29 U/L (ref 0–35)
AST: 37 U/L (ref 0–37)
AST: 38 U/L — ABNORMAL HIGH (ref 0–37)
Albumin: 3.3 g/dL — ABNORMAL LOW (ref 3.5–5.2)
Albumin: 3.5 g/dL (ref 3.5–5.2)
Alkaline Phosphatase: 120 U/L — ABNORMAL HIGH (ref 39–117)
Alkaline Phosphatase: 124 U/L — ABNORMAL HIGH (ref 39–117)
BUN: 12 mg/dL (ref 6–23)
BUN: 6 mg/dL (ref 6–23)
CO2: 19 mEq/L (ref 19–32)
CO2: 26 mEq/L (ref 19–32)
Calcium: 9.4 mg/dL (ref 8.4–10.5)
Calcium: 9.6 mg/dL (ref 8.4–10.5)
Chloride: 102 mEq/L (ref 96–112)
Chloride: 97 mEq/L (ref 96–112)
Creatinine, Ser: 1.2 mg/dL (ref 0.4–1.2)
Creatinine, Ser: 1.23 mg/dL — ABNORMAL HIGH (ref 0.4–1.2)
GFR calc Af Amer: 54 mL/min — ABNORMAL LOW (ref >60)
GFR calc Af Amer: 55 mL/min — ABNORMAL LOW (ref >60)
GFR calc non Af Amer: 45 mL/min — ABNORMAL LOW (ref >60)
GFR calc non Af Amer: 46 mL/min — ABNORMAL LOW (ref >60)
Glucose, Bld: 119 mg/dL — ABNORMAL HIGH (ref 70–99)
Glucose, Bld: 134 mg/dL — ABNORMAL HIGH (ref 70–99)
Potassium: 3.3 mEq/L — ABNORMAL LOW (ref 3.5–5.1)
Potassium: 3.5 mEq/L (ref 3.5–5.1)
Sodium: 131 mEq/L — ABNORMAL LOW (ref 135–145)
Sodium: 132 mEq/L — ABNORMAL LOW (ref 135–145)
Total Bilirubin: 0.8 mg/dL (ref 0.3–1.2)
Total Bilirubin: 0.9 mg/dL (ref 0.3–1.2)
Total Protein: 7.6 g/dL (ref 6.0–8.3)
Total Protein: 7.8 g/dL (ref 6.0–8.3)

## 2011-01-21 LAB — CBC
HCT: 33.3 % — ABNORMAL LOW (ref 36.0–46.0)
HCT: 36.9 % (ref 36.0–46.0)
HCT: 38.9 % (ref 36.0–46.0)
HCT: 43 % (ref 36.0–46.0)
Hemoglobin: 11.3 g/dL — ABNORMAL LOW (ref 12.0–15.0)
Hemoglobin: 12.2 g/dL (ref 12.0–15.0)
Hemoglobin: 13.2 g/dL (ref 12.0–15.0)
Hemoglobin: 14.5 g/dL (ref 12.0–15.0)
MCHC: 33.1 g/dL (ref 30.0–36.0)
MCHC: 33.7 g/dL (ref 30.0–36.0)
MCHC: 33.8 g/dL (ref 30.0–36.0)
MCHC: 34 g/dL (ref 30.0–36.0)
MCV: 87.2 fL (ref 78.0–100.0)
MCV: 87.3 fL (ref 78.0–100.0)
MCV: 87.4 fL (ref 78.0–100.0)
MCV: 88.1 fL (ref 78.0–100.0)
Platelets: 339 10*3/uL (ref 150–400)
Platelets: 375 10*3/uL (ref 150–400)
Platelets: 395 10*3/uL (ref 150–400)
Platelets: 424 10*3/uL — ABNORMAL HIGH (ref 150–400)
RBC: 3.81 MIL/uL — ABNORMAL LOW (ref 3.87–5.11)
RBC: 4.19 MIL/uL (ref 3.87–5.11)
RBC: 4.46 MIL/uL (ref 3.87–5.11)
RBC: 4.93 MIL/uL (ref 3.87–5.11)
RDW: 14.5 % (ref 11.5–15.5)
RDW: 14.7 % (ref 11.5–15.5)
RDW: 14.7 % (ref 11.5–15.5)
RDW: 14.8 % (ref 11.5–15.5)
WBC: 12.5 10*3/uL — ABNORMAL HIGH (ref 4.0–10.5)
WBC: 13.5 10*3/uL — ABNORMAL HIGH (ref 4.0–10.5)
WBC: 7.5 10*3/uL (ref 4.0–10.5)
WBC: 8.4 10*3/uL (ref 4.0–10.5)

## 2011-01-21 LAB — DIFFERENTIAL
Basophils Absolute: 0 10*3/uL (ref 0.0–0.1)
Basophils Relative: 0 % (ref 0–1)
Eosinophils Absolute: 0.1 10*3/uL (ref 0.0–0.7)
Eosinophils Relative: 1 % (ref 0–5)
Lymphocytes Relative: 13 % (ref 12–46)
Lymphs Abs: 1.1 10*3/uL (ref 0.7–4.0)
Monocytes Absolute: 0.5 10*3/uL (ref 0.1–1.0)
Monocytes Relative: 6 % (ref 3–12)
Neutro Abs: 6.7 10*3/uL (ref 1.7–7.7)
Neutrophils Relative %: 79 % — ABNORMAL HIGH (ref 43–77)

## 2011-01-21 LAB — URINE MICROSCOPIC-ADD ON

## 2011-01-21 LAB — GLUCOSE, CAPILLARY
Glucose-Capillary: 111 mg/dL — ABNORMAL HIGH (ref 70–99)
Glucose-Capillary: 113 mg/dL — ABNORMAL HIGH (ref 70–99)
Glucose-Capillary: 116 mg/dL — ABNORMAL HIGH (ref 70–99)
Glucose-Capillary: 124 mg/dL — ABNORMAL HIGH (ref 70–99)
Glucose-Capillary: 125 mg/dL — ABNORMAL HIGH (ref 70–99)
Glucose-Capillary: 125 mg/dL — ABNORMAL HIGH (ref 70–99)
Glucose-Capillary: 141 mg/dL — ABNORMAL HIGH (ref 70–99)
Glucose-Capillary: 148 mg/dL — ABNORMAL HIGH (ref 70–99)

## 2011-01-21 LAB — LIPASE, BLOOD
Lipase: 116 U/L — ABNORMAL HIGH (ref 11–59)
Lipase: 172 U/L — ABNORMAL HIGH (ref 11–59)
Lipase: 79 U/L — ABNORMAL HIGH (ref 11–59)
Lipase: 93 U/L — ABNORMAL HIGH (ref 11–59)

## 2011-01-21 LAB — HEPATIC FUNCTION PANEL
ALT: 23 U/L (ref 0–35)
AST: 36 U/L (ref 0–37)
Albumin: 3.4 g/dL — ABNORMAL LOW (ref 3.5–5.2)
Alkaline Phosphatase: 102 U/L (ref 39–117)
Bilirubin, Direct: 0.3 mg/dL (ref 0.0–0.3)
Indirect Bilirubin: 0.5 mg/dL (ref 0.3–0.9)
Total Bilirubin: 0.8 mg/dL (ref 0.3–1.2)
Total Protein: 7.1 g/dL (ref 6.0–8.3)

## 2011-01-21 LAB — HEMOGLOBIN A1C
Hgb A1c MFr Bld: 6.2 % — ABNORMAL HIGH (ref 4.6–6.1)
Mean Plasma Glucose: 131 mg/dL

## 2011-01-21 LAB — AMYLASE: Amylase: 121 U/L (ref 27–131)

## 2011-01-22 LAB — BASIC METABOLIC PANEL
BUN: 8 mg/dL (ref 6–23)
BUN: 16 mg/dL (ref 6–23)
BUN: 17 mg/dL (ref 6–23)
BUN: 18 mg/dL (ref 6–23)
CO2: 29 mEq/L (ref 19–32)
CO2: 27 mEq/L (ref 19–32)
CO2: 27 mEq/L (ref 19–32)
CO2: 29 mEq/L (ref 19–32)
Calcium: 8.9 mg/dL (ref 8.4–10.5)
Calcium: 8.9 mg/dL (ref 8.4–10.5)
Calcium: 9 mg/dL (ref 8.4–10.5)
Calcium: 9.2 mg/dL (ref 8.4–10.5)
Chloride: 104 mEq/L (ref 96–112)
Chloride: 91 mEq/L — ABNORMAL LOW (ref 96–112)
Chloride: 91 mEq/L — ABNORMAL LOW (ref 96–112)
Chloride: 94 mEq/L — ABNORMAL LOW (ref 96–112)
Creatinine, Ser: 1.23 mg/dL — ABNORMAL HIGH (ref 0.4–1.2)
Creatinine, Ser: 1.4 mg/dL — ABNORMAL HIGH (ref 0.4–1.2)
Creatinine, Ser: 1.47 mg/dL — ABNORMAL HIGH (ref 0.4–1.2)
Creatinine, Ser: 1.47 mg/dL — ABNORMAL HIGH (ref 0.4–1.2)
GFR calc Af Amer: 54 mL/min — ABNORMAL LOW (ref >60)
GFR calc Af Amer: 44 mL/min — ABNORMAL LOW (ref >60)
GFR calc Af Amer: 44 mL/min — ABNORMAL LOW (ref >60)
GFR calc Af Amer: 46 mL/min — ABNORMAL LOW (ref >60)
GFR calc non Af Amer: 45 mL/min — ABNORMAL LOW (ref >60)
GFR calc non Af Amer: 36 mL/min — ABNORMAL LOW (ref >60)
GFR calc non Af Amer: 36 mL/min — ABNORMAL LOW (ref >60)
GFR calc non Af Amer: 38 mL/min — ABNORMAL LOW (ref >60)
Glucose, Bld: 130 mg/dL — ABNORMAL HIGH (ref 70–99)
Glucose, Bld: 134 mg/dL — ABNORMAL HIGH (ref 70–99)
Glucose, Bld: 153 mg/dL — ABNORMAL HIGH (ref 70–99)
Glucose, Bld: 176 mg/dL — ABNORMAL HIGH (ref 70–99)
Potassium: 3.8 mEq/L (ref 3.5–5.1)
Potassium: 3.6 mEq/L (ref 3.5–5.1)
Potassium: 3.7 mEq/L (ref 3.5–5.1)
Potassium: 3.8 mEq/L (ref 3.5–5.1)
Sodium: 139 mEq/L (ref 135–145)
Sodium: 128 mEq/L — ABNORMAL LOW (ref 135–145)
Sodium: 130 mEq/L — ABNORMAL LOW (ref 135–145)
Sodium: 130 mEq/L — ABNORMAL LOW (ref 135–145)

## 2011-01-22 LAB — CBC
HCT: 31.7 % — ABNORMAL LOW (ref 36.0–46.0)
HCT: 31.7 % — ABNORMAL LOW (ref 36.0–46.0)
HCT: 33.7 % — ABNORMAL LOW (ref 36.0–46.0)
HCT: 33.7 % — ABNORMAL LOW (ref 36.0–46.0)
HCT: 33.8 % — ABNORMAL LOW (ref 36.0–46.0)
HCT: 32.3 % — ABNORMAL LOW (ref 36.0–46.0)
HCT: 32.3 % — ABNORMAL LOW (ref 36.0–46.0)
HCT: 33.7 % — ABNORMAL LOW (ref 36.0–46.0)
HCT: 33.7 % — ABNORMAL LOW (ref 36.0–46.0)
HCT: 33.7 % — ABNORMAL LOW (ref 36.0–46.0)
HCT: 34.9 % — ABNORMAL LOW (ref 36.0–46.0)
HCT: 36.5 % (ref 36.0–46.0)
HCT: 36.5 % (ref 36.0–46.0)
Hemoglobin: 10.5 g/dL — ABNORMAL LOW (ref 12.0–15.0)
Hemoglobin: 10.6 g/dL — ABNORMAL LOW (ref 12.0–15.0)
Hemoglobin: 11.2 g/dL — ABNORMAL LOW (ref 12.0–15.0)
Hemoglobin: 11.2 g/dL — ABNORMAL LOW (ref 12.0–15.0)
Hemoglobin: 11.3 g/dL — ABNORMAL LOW (ref 12.0–15.0)
Hemoglobin: 10.7 g/dL — ABNORMAL LOW (ref 12.0–15.0)
Hemoglobin: 11.1 g/dL — ABNORMAL LOW (ref 12.0–15.0)
Hemoglobin: 11.2 g/dL — ABNORMAL LOW (ref 12.0–15.0)
Hemoglobin: 11.2 g/dL — ABNORMAL LOW (ref 12.0–15.0)
Hemoglobin: 11.3 g/dL — ABNORMAL LOW (ref 12.0–15.0)
Hemoglobin: 11.6 g/dL — ABNORMAL LOW (ref 12.0–15.0)
Hemoglobin: 12.2 g/dL (ref 12.0–15.0)
Hemoglobin: 12.3 g/dL (ref 12.0–15.0)
MCHC: 33.2 g/dL (ref 30.0–36.0)
MCHC: 33.2 g/dL (ref 30.0–36.0)
MCHC: 33.3 g/dL (ref 30.0–36.0)
MCHC: 33.4 g/dL (ref 30.0–36.0)
MCHC: 33.4 g/dL (ref 30.0–36.0)
MCHC: 33.1 g/dL (ref 30.0–36.0)
MCHC: 33.2 g/dL (ref 30.0–36.0)
MCHC: 33.3 g/dL (ref 30.0–36.0)
MCHC: 33.3 g/dL (ref 30.0–36.0)
MCHC: 33.4 g/dL (ref 30.0–36.0)
MCHC: 33.4 g/dL (ref 30.0–36.0)
MCHC: 33.6 g/dL (ref 30.0–36.0)
MCHC: 34.2 g/dL (ref 30.0–36.0)
MCV: 87.4 fL (ref 78.0–100.0)
MCV: 88.2 fL (ref 78.0–100.0)
MCV: 88.4 fL (ref 78.0–100.0)
MCV: 88.5 fL (ref 78.0–100.0)
MCV: 88.6 fL (ref 78.0–100.0)
MCV: 87.9 fL (ref 78.0–100.0)
MCV: 88.2 fL (ref 78.0–100.0)
MCV: 88.5 fL (ref 78.0–100.0)
MCV: 88.7 fL (ref 78.0–100.0)
MCV: 88.8 fL (ref 78.0–100.0)
MCV: 89.5 fL (ref 78.0–100.0)
MCV: 89.5 fL (ref 78.0–100.0)
MCV: 90.2 fL (ref 78.0–100.0)
Platelets: 525 10*3/uL — ABNORMAL HIGH (ref 150–400)
Platelets: 528 10*3/uL — ABNORMAL HIGH (ref 150–400)
Platelets: 595 10*3/uL — ABNORMAL HIGH (ref 150–400)
Platelets: 599 10*3/uL — ABNORMAL HIGH (ref 150–400)
Platelets: 678 10*3/uL — ABNORMAL HIGH (ref 150–400)
Platelets: 341 10*3/uL (ref 150–400)
Platelets: 555 10*3/uL — ABNORMAL HIGH (ref 150–400)
Platelets: 563 10*3/uL — ABNORMAL HIGH (ref 150–400)
Platelets: 584 10*3/uL — ABNORMAL HIGH (ref 150–400)
Platelets: 593 10*3/uL — ABNORMAL HIGH (ref 150–400)
Platelets: 606 10*3/uL — ABNORMAL HIGH (ref 150–400)
Platelets: 608 10*3/uL — ABNORMAL HIGH (ref 150–400)
Platelets: 628 10*3/uL — ABNORMAL HIGH (ref 150–400)
RBC: 3.59 MIL/uL — ABNORMAL LOW (ref 3.87–5.11)
RBC: 3.63 MIL/uL — ABNORMAL LOW (ref 3.87–5.11)
RBC: 3.8 MIL/uL — ABNORMAL LOW (ref 3.87–5.11)
RBC: 3.81 MIL/uL — ABNORMAL LOW (ref 3.87–5.11)
RBC: 3.83 MIL/uL — ABNORMAL LOW (ref 3.87–5.11)
RBC: 3.65 MIL/uL — ABNORMAL LOW (ref 3.87–5.11)
RBC: 3.68 MIL/uL — ABNORMAL LOW (ref 3.87–5.11)
RBC: 3.73 MIL/uL — ABNORMAL LOW (ref 3.87–5.11)
RBC: 3.76 MIL/uL — ABNORMAL LOW (ref 3.87–5.11)
RBC: 3.76 MIL/uL — ABNORMAL LOW (ref 3.87–5.11)
RBC: 3.93 MIL/uL (ref 3.87–5.11)
RBC: 4.11 MIL/uL (ref 3.87–5.11)
RBC: 4.13 MIL/uL (ref 3.87–5.11)
RDW: 14.1 % (ref 11.5–15.5)
RDW: 14.2 % (ref 11.5–15.5)
RDW: 14.5 % (ref 11.5–15.5)
RDW: 14.5 % (ref 11.5–15.5)
RDW: 14.6 % (ref 11.5–15.5)
RDW: 13.5 % (ref 11.5–15.5)
RDW: 13.5 % (ref 11.5–15.5)
RDW: 13.7 % (ref 11.5–15.5)
RDW: 13.8 % (ref 11.5–15.5)
RDW: 13.9 % (ref 11.5–15.5)
RDW: 13.9 % (ref 11.5–15.5)
RDW: 14.2 % (ref 11.5–15.5)
RDW: 14.4 % (ref 11.5–15.5)
WBC: 11.1 10*3/uL — ABNORMAL HIGH (ref 4.0–10.5)
WBC: 13.2 10*3/uL — ABNORMAL HIGH (ref 4.0–10.5)
WBC: 13.6 10*3/uL — ABNORMAL HIGH (ref 4.0–10.5)
WBC: 15.8 10*3/uL — ABNORMAL HIGH (ref 4.0–10.5)
WBC: 16.4 10*3/uL — ABNORMAL HIGH (ref 4.0–10.5)
WBC: 13.3 10*3/uL — ABNORMAL HIGH (ref 4.0–10.5)
WBC: 14.7 10*3/uL — ABNORMAL HIGH (ref 4.0–10.5)
WBC: 14.9 10*3/uL — ABNORMAL HIGH (ref 4.0–10.5)
WBC: 16 10*3/uL — ABNORMAL HIGH (ref 4.0–10.5)
WBC: 16.7 10*3/uL — ABNORMAL HIGH (ref 4.0–10.5)
WBC: 17.3 10*3/uL — ABNORMAL HIGH (ref 4.0–10.5)
WBC: 20.1 10*3/uL — ABNORMAL HIGH (ref 4.0–10.5)
WBC: 22.6 10*3/uL — ABNORMAL HIGH (ref 4.0–10.5)

## 2011-01-22 LAB — SODIUM: Sodium: 125 mEq/L — ABNORMAL LOW (ref 135–145)

## 2011-01-22 LAB — HEPATIC FUNCTION PANEL
ALT: 36 U/L — ABNORMAL HIGH (ref 0–35)
ALT: 47 U/L — ABNORMAL HIGH (ref 0–35)
AST: 101 U/L — ABNORMAL HIGH (ref 0–37)
AST: 49 U/L — ABNORMAL HIGH (ref 0–37)
Albumin: 2.7 g/dL — ABNORMAL LOW (ref 3.5–5.2)
Albumin: 2.8 g/dL — ABNORMAL LOW (ref 3.5–5.2)
Alkaline Phosphatase: 128 U/L — ABNORMAL HIGH (ref 39–117)
Alkaline Phosphatase: 91 U/L (ref 39–117)
Bilirubin, Direct: 0.5 mg/dL — ABNORMAL HIGH (ref 0.0–0.3)
Bilirubin, Direct: 0.6 mg/dL — ABNORMAL HIGH (ref 0.0–0.3)
Indirect Bilirubin: 0.7 mg/dL (ref 0.3–0.9)
Indirect Bilirubin: 0.8 mg/dL (ref 0.3–0.9)
Total Bilirubin: 1.2 mg/dL (ref 0.3–1.2)
Total Bilirubin: 1.4 mg/dL — ABNORMAL HIGH (ref 0.3–1.2)
Total Protein: 6.4 g/dL (ref 6.0–8.3)
Total Protein: 6.8 g/dL (ref 6.0–8.3)

## 2011-01-22 LAB — BILIRUBIN, TOTAL
Total Bilirubin: 0.9 mg/dL (ref 0.3–1.2)
Total Bilirubin: 1.1 mg/dL (ref 0.3–1.2)

## 2011-01-22 LAB — CREATININE, SERUM
Creatinine, Ser: 1.16 mg/dL (ref 0.4–1.2)
Creatinine, Ser: 1.29 mg/dL — ABNORMAL HIGH (ref 0.4–1.2)
Creatinine, Ser: 1.31 mg/dL — ABNORMAL HIGH (ref 0.4–1.2)
Creatinine, Ser: 1.38 mg/dL — ABNORMAL HIGH (ref 0.4–1.2)
Creatinine, Ser: 1.48 mg/dL — ABNORMAL HIGH (ref 0.4–1.2)
Creatinine, Ser: 1.57 mg/dL — ABNORMAL HIGH (ref 0.4–1.2)
Creatinine, Ser: 1.59 mg/dL — ABNORMAL HIGH (ref 0.4–1.2)
Creatinine, Ser: 1.82 mg/dL — ABNORMAL HIGH (ref 0.4–1.2)
Creatinine, Ser: 1.97 mg/dL — ABNORMAL HIGH (ref 0.4–1.2)
GFR calc Af Amer: 47 mL/min — ABNORMAL LOW (ref >60)
GFR calc Af Amer: 50 mL/min — ABNORMAL LOW (ref >60)
GFR calc Af Amer: 51 mL/min — ABNORMAL LOW (ref >60)
GFR calc Af Amer: 58 mL/min — ABNORMAL LOW (ref >60)
GFR calc Af Amer: 31 mL/min — ABNORMAL LOW (ref >60)
GFR calc Af Amer: 34 mL/min — ABNORMAL LOW (ref >60)
GFR calc Af Amer: 40 mL/min — ABNORMAL LOW (ref >60)
GFR calc Af Amer: 41 mL/min — ABNORMAL LOW (ref >60)
GFR calc Af Amer: 44 mL/min — ABNORMAL LOW (ref >60)
GFR calc non Af Amer: 39 mL/min — ABNORMAL LOW (ref >60)
GFR calc non Af Amer: 41 mL/min — ABNORMAL LOW (ref >60)
GFR calc non Af Amer: 42 mL/min — ABNORMAL LOW (ref >60)
GFR calc non Af Amer: 48 mL/min — ABNORMAL LOW (ref >60)
GFR calc non Af Amer: 26 mL/min — ABNORMAL LOW (ref >60)
GFR calc non Af Amer: 28 mL/min — ABNORMAL LOW (ref >60)
GFR calc non Af Amer: 33 mL/min — ABNORMAL LOW (ref >60)
GFR calc non Af Amer: 34 mL/min — ABNORMAL LOW (ref >60)
GFR calc non Af Amer: 36 mL/min — ABNORMAL LOW (ref >60)

## 2011-01-22 LAB — COMPREHENSIVE METABOLIC PANEL
ALT: 17 U/L (ref 0–35)
AST: 30 U/L (ref 0–37)
Albumin: 2.2 g/dL — ABNORMAL LOW (ref 3.5–5.2)
Alkaline Phosphatase: 103 U/L (ref 39–117)
BUN: 12 mg/dL (ref 6–23)
CO2: 23 mEq/L (ref 19–32)
Calcium: 8.6 mg/dL (ref 8.4–10.5)
Chloride: 104 mEq/L (ref 96–112)
Creatinine, Ser: 1.47 mg/dL — ABNORMAL HIGH (ref 0.4–1.2)
GFR calc Af Amer: 44 mL/min — ABNORMAL LOW (ref >60)
GFR calc non Af Amer: 36 mL/min — ABNORMAL LOW (ref >60)
Glucose, Bld: 111 mg/dL — ABNORMAL HIGH (ref 70–99)
Potassium: 3.4 mEq/L — ABNORMAL LOW (ref 3.5–5.1)
Sodium: 133 mEq/L — ABNORMAL LOW (ref 135–145)
Total Bilirubin: 0.7 mg/dL (ref 0.3–1.2)
Total Protein: 5.5 g/dL — ABNORMAL LOW (ref 6.0–8.3)

## 2011-01-22 LAB — URINALYSIS, ROUTINE W REFLEX MICROSCOPIC
Bilirubin Urine: NEGATIVE
Bilirubin Urine: NEGATIVE
Glucose, UA: NEGATIVE mg/dL
Glucose, UA: NEGATIVE mg/dL
Hgb urine dipstick: NEGATIVE
Hgb urine dipstick: NEGATIVE
Ketones, ur: NEGATIVE mg/dL
Ketones, ur: NEGATIVE mg/dL
Nitrite: NEGATIVE
Nitrite: POSITIVE — AB
Protein, ur: NEGATIVE mg/dL
Protein, ur: NEGATIVE mg/dL
Specific Gravity, Urine: 1.006 (ref 1.005–1.030)
Specific Gravity, Urine: 1.017 (ref 1.005–1.030)
Urobilinogen, UA: 1 mg/dL (ref 0.0–1.0)
Urobilinogen, UA: 1 mg/dL (ref 0.0–1.0)
pH: 6 (ref 5.0–8.0)
pH: 6.5 (ref 5.0–8.0)

## 2011-01-22 LAB — URINE CULTURE
Colony Count: NO GROWTH
Colony Count: NO GROWTH
Culture: NO GROWTH
Culture: NO GROWTH
Special Requests: NEGATIVE
Special Requests: NEGATIVE

## 2011-01-22 LAB — POTASSIUM
Potassium: 3.4 mEq/L — ABNORMAL LOW (ref 3.5–5.1)
Potassium: 3.5 mEq/L (ref 3.5–5.1)
Potassium: 3.5 mEq/L (ref 3.5–5.1)
Potassium: 3.7 mEq/L (ref 3.5–5.1)
Potassium: 3.3 mEq/L — ABNORMAL LOW (ref 3.5–5.1)
Potassium: 3.4 mEq/L — ABNORMAL LOW (ref 3.5–5.1)
Potassium: 3.7 mEq/L (ref 3.5–5.1)
Potassium: 3.7 mEq/L (ref 3.5–5.1)

## 2011-01-22 LAB — CLOSTRIDIUM DIFFICILE EIA
C difficile Toxins A+B, EIA: NEGATIVE
C difficile Toxins A+B, EIA: NEGATIVE

## 2011-01-22 LAB — CREATININE, URINE, RANDOM: Creatinine, Urine: 104.8 mg/dL

## 2011-01-22 LAB — PREALBUMIN: Prealbumin: 11.1 mg/dL — ABNORMAL LOW (ref 18.0–45.0)

## 2011-01-22 LAB — IRON AND TIBC
Iron: 26 ug/dL — ABNORMAL LOW (ref 42–135)
Saturation Ratios: 11 % — ABNORMAL LOW (ref 20–55)
TIBC: 238 ug/dL — ABNORMAL LOW (ref 250–470)
UIBC: 212 ug/dL

## 2011-01-22 LAB — LIPASE, BLOOD: Lipase: 71 U/L — ABNORMAL HIGH (ref 11–59)

## 2011-01-22 LAB — URINE MICROSCOPIC-ADD ON

## 2011-01-22 LAB — GLUCOSE, CAPILLARY: Glucose-Capillary: 115 mg/dL — ABNORMAL HIGH (ref 70–99)

## 2011-01-22 LAB — SODIUM, URINE, RANDOM: Sodium, Ur: 10 mEq/L

## 2011-02-24 NOTE — Discharge Summary (Signed)
Becky Hobbs, Becky Hobbs NO.:  [id number]   MEDICAL RECORD NO.:  [id number]          PATIENT TYPE:  INP   LOCATION:  1523                         FACILITY:  [facility]   PHYSICIAN:  Adin Hector, MD     DATE OF BIRTH:  1946-04-29   DATE OF ADMISSION:  01/12/2009  DATE OF DISCHARGE:  01/20/2009                               DISCHARGE SUMMARY   ADDENDUM:   HOSPITAL COURSE:  Please note that the patient had complaints of a  headache and difficulty breathing.  She was given saline drops and put  on standing Claritin.  It did not improve.  She was placed on Flonase  nasal drops.  It somewhat improved.  She was started on a Z-Pak by Dr.  Brigitte Pulse and she had a three-day course of that.  It seemed to improve so  that by the time of discharge she was feeling much better.  We  transitioned her down to just Flonase and Claritin for that at this  time.      Adin Hector, MD  Electronically Signed     SCG/MEDQ  D:  01/20/2009  T:  01/20/2009  Job:  [id number]

## 2011-02-24 NOTE — H&P (Signed)
Becky Hobbs, HOXSIE NO.:  [id number]   MEDICAL RECORD NO.:  [id number]          PATIENT TYPE:  AMB   LOCATION:  DAY                          FACILITY:  [facility]   PHYSICIAN:  Adin Hector, MD     DATE OF BIRTH:  04/11/1946   DATE OF ADMISSION:  12/13/2008  DATE OF DISCHARGE:                              HISTORY & PHYSICAL   PRIMARY CARE PHYSICIAN:  Dr. Dione Housekeeper.   SURGEON:  Michael Boston, MD.   REASON FOR CONSULT AND ADMISSION:  Nausea, vomiting, abdominal pain,  probable acute cholecystitis.   HISTORY OF PRESENT ILLNESS:  Becky Hobbs is a 65 year old obese female who  has had a history of intermittent right upper quadrant abdominal pain  and nausea for several years.  It has usually been mild and self-  resolving.  She has a history of gastroesophageal reflux disease status  post an open Nissen fundoplication 30 years ago.  She occasionally has  some mild dysphagia with an EGD done in 2006 that ruled out a stricture  and showed some mild gastritis.  She takes some intermittent Tums.   She noted, however, 4 days ago she had severe pain after having some  greasy fries with bacon on it.  Pain never really resolved.  She had  worsening nausea and has been throwing up the past few days.  She last  ate last night just some saltine crackers.  Pain worsened to the point  that she went and saw her primary care physician.  Workup was done with  a negative KUB but an elevated white count of 17 and an ultrasound  concerning for cholecystitis and therefore he called me requesting  admission and evaluation.   Patient denies any sick contacts or travel history.  She has a bowel  movement about every day without any severe constipation or diarrhea.  She has some melanosis coli but otherwise negative colonoscopy back in  2005.  She has pretty good exercise tolerance and exercises on an  elliptical machine up to 30 minutes at a time and no recent history of  significant  cardiopulmonary or other disease.   PAST MEDICAL HISTORY:  1. Gastroesophageal reflux disease status post open Nissen      fundoplication 30 years ago on intermittent antacids.  2. Hypertension.  3. Chronic gastritis.  4. Negative colonoscopy in 2005 except for melanosis coli.  5. 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  6. Gout.  7. She also has hypothyroidism.   PAST SURGICAL HISTORY:  1. She had an open Nissen fundoplication 30 years ago when she lived      up in [place].  2. She has had a C-section in the past.  3. She has had an ovarian cystectomy in the past.  4. She has had an abdominal hysterectomy in 1999.  5. She had a bladder tuck in 2000.  6. She has had foot surgery in the past.  7. She had an open right breast biopsy in  the past.   ALLERGIES:  1. PENICILLIN GIVES HER HIVES.  2. ERYTHROMYCIN GIVES HER HIVES.  3. VICODIN GIVES HER ITCHING.  4. SHE HAD ITCHING TO DARVOCET AS WELL.  5. INTOLERENCE TO CELEBREX in the past   MEDICATIONS:  Include:  1. Ambien 12.5 mg q.h.s.  2. Triple antibiotic ointment.  3. Synthroid 75 mcg daily.  4. Premarin 0.3 mg daily.  5. Toprol XL 50 mg daily.  6. Eyedrops 0.1% p.r.n.  7. Zyrtec 10 mg p.o. daily.  8. EpiPen p.r.n.  9. Allopurinol 100 mg p.o. daily.  10.Potassium 20 mg daily p.r.n.  11.Iron daily.  12.Glucosamine daily.   SOCIAL HISTORY:  She denies any tobacco, alcohol, or drug use.  She is  originally from the [place] area but she has been here for several  years.   FAMILY HISTORY:  Noncontributory for any major GI disorder such as  inflammatory bowel disease, irritable bowel syndrome, or hepatobiliary  or pancreatic disorders.   REVIEW OF SYSTEMS:  As noted per HPI.  GENERAL:  No fevers, chills, or  sweats.  No change in her weight.  EYES:  Otherwise negative.  ENT:  Otherwise negative.  CARDIAC:  Otherwise negative.  She has  excellent  physical tolerance.  PULMONARY:  Otherwise negative.  GU:  She does have  difficulty with urination and has to stand up to urinate often but is  not interested in getting another bladder tuck even though she thinks  she is overdue.  She had some orange tinge to her urine earlier today  and decreased urine output but no burning, irritation, frequency, or  urgency.  No recent UTIs.  GYN:  No vaginal bleeding or discharge.  MUSCULOSKELETAL:  Negative.  NEUROLOGICAL:  Negative.  HEME:  Negative.  LYMPH:  Negative.  ALLERGIC:  Seasonal allergies stable.  DERMATOLOGIC:  Negative at this time.  BREASTS:  Negative at this time.  Others are  negative at this time.   VITAL SIGNS:  Temperature is 97.1.  Pulse 102.  Respirations 18.  Blood  pressure 146/82.  She is 100% saturations on room air.  She is 5 feet 5,  around 203 pounds giving her a BMI of I believe around 30.  GENERAL EXAMINATION:  She is a well-developed, well-nourished, talkative  female in no acute distress.  PSYCH:  She is pleasant and interactive with at least average  intelligence, quite joking.  No evidence of any dementia, delirium,  psychosis, or paranoia.  EYES:  Pupils equal, round, and reactive to light.  Extraocular  movements are intact.  Her sclerae are not icteric or injected.  NEUROLOGICAL:  Cranial nerves II-XII are intact.  Hand grip is 5/5,  equal and symmetrical.  No resting or intention tremors.  HEENT:  She is normocephalic.  Mucous membranes are dry.  The  nasopharynx and oropharynx are clear.  Her dentition is excellent.  NECK:  Supple without any masses.  Trachea is midline.  LYMPH:  No head, neck, axillary, or groin lymphadenopathy.  HEART:  Regular rate and rhythm.  No murmurs, clicks, or rubs.  CHEST:  No pain to rib or sternal compression.  Clear to auscultation  bilaterally.  No wheezes, rales, or rhonchi.  ABDOMEN:  Obese but soft.  She has a well-healed upper midline incision  with no  hernia.  She does have tenderness in her right subcostal ridge  with a classic Murphy sign.  The rest of her abdomen is obese but soft.  GENITAL:  Normal external female genitalia.  No vaginal bleeding or  discharge.  RECTAL:  Deferred by patient request.  EXTREMITIES:  No clubbing or cyanosis.  No edema.  MUSCULOSKELETAL:  Full range of motion at her shoulders, elbows, wrists,  as well as hips, knees, and ankles.  BACK:  No pain on cervical, thoracolumbar, or sacral spine although she  has tenderness in her right costophrenic angle and not so much in her  left.  SKIN:  No petechiae.  No purpura.  No obvious sores or lesions.   STUDIES:  She has a white count of 17 which is elevated, hemoglobin is  normal.  Liver function test shows a slightly elevated AST at 53.  She  has a KUB that shows no obvious bowel obstruction or free air but obese.  She has an ultrasound which I do not have the official report on but on  reviewing she appears to have gallbladder wall thickening with obvious  stones, it is hard to tell if they are wedged but they are down near the  neck.  Her gallbladder wall is 4.2 mm which is thickened.  Her common  bile duct is 5 mm which does not seem to be enlarged.  Her liver and  kidneys seem to be okay.  Again, this is my read and not an official  report; it was not available with the patient.  She has an old EKG which  is normal.   ASSESSMENT AND PLAN:  Obese female with classic story of biliary colic  now constant who has had Murphy sign, ultrasound, and white count  concerning for cholecystitis.  1. Admit.  2. Intravenous clindamycin and ciprofloxacin given her allergies to      other medications.  3. Intravenous fluids.  Note, her creatinine is 1.5, mild renal      failure.  Give her intravenous fluid hydration and monitor.  Check      urinalysis for signs of urinary tract infection or other sorts of      stress or urinary strain.  4. Laparoscopic  cholecystectomy with intraoperative cholangiogram.   The anatomy and physiology of hepatobiliary and pancreatic function was  discussed.  Pathophysiology of cholecystolithiasis and cholecystitis was  discussed.  Technique of a laparoscopic cholecystectomy with possible  conversion to open and cholangiogram was discussed.  Risks, benefits,  and alternatives were discussed.  Questions answered.  Patient agrees to  proceed.   OTHER POINTS:  1. DVT prophylaxis.  2. Maintain blood pressure control.  3. Gout prophylaxis.  4. Analgesia.  We will try to find appropriate regimen that will be      tolerant to her since she has history of poor tolerance to      narcotics.      Adin Hector, MD  Electronically Signed     SCG/MEDQ  D:  12/13/2008  T:  12/13/2008  Job:  [id number]   cc:   Janalyn Rouse, M.D.  Fax: [place] Amedeo Plenty, M.D.  Fax: [phone]

## 2011-02-24 NOTE — Discharge Summary (Signed)
[name], Hobbs NO.:  [id number]   MEDICAL RECORD NO.:  [id number]          PATIENT TYPE:  INP   LOCATION:  [id number]                         FACILITY:  [facility]   PHYSICIAN:  Adin Hector, MD     DATE OF BIRTH:  03/31/1946   DATE OF ADMISSION:  12/13/2008  DATE OF DISCHARGE:                               DISCHARGE SUMMARY   PRIMARY CARE PHYSICIAN:  Marton Redwood, MD.   SURGEON:  Adin Hector, MD.   DATE OF DISCHARGE:  December 30, 2008.   PRINCIPAL DIAGNOSIS:  Gangrenous cholecystitis with empyema of the  gallbladder and ligation of right hepatic duct in the posterior biliary  branch.   OTHER FINAL DISCHARGE DIAGNOSES:  1. Acute renal failure on chronic renal insufficiency with renal      failure resolved.  2. Severe colonic ileus, culture negative.  3. Urinary retention.  4. Gastroesophageal reflux disease.  5. Status post open Nissen fundoplication 30 years ago.  6. Hypertension.  7. Chronic gastritis.  8. 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  9. 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 Hoxworth and Tsuei.  10.Foot surgery in the past.  11.Bladder tack in the past.  12.Abdominal hysterectomy in 1999.  13.Ovarian cystectomy in the past.  14.C-section in the distant past.  15.Gout.  16.Hypothyroidism.   PRINCIPAL PROCEDURE:  Laparoscopic lysis of adhesions, cholecystectomy  with intraoperative cholangiogram and ligation of biliary radical of  right hepatic duct on December 13, 2008.   OTHER STUDIES:  1. Show a CT scan of the abdomen and pelvis on postoperative day 4      which shows severe colonic ileus but no evidence of an intra-      abdominal complication, abscess or free air.  2. A chest x-ray which showed some atelectasis __________on December 23, 2008.   SUMMARY OF HOSPITAL COURSE:  Becky Hobbs is a 65 year old morbidly obese  female with numerous health issues as noted above.  She had  intermittent  abdominal pain for several years.  However, four days prior to admission  she had severe pain that did not improve.  She saw her primary care  physician and after a discussion with me we admitted the patient and I  did an urgent cholecystectomy.  She was placed on IV antibiotics.  She  developed a severe colonic ileus.  She had CAT scans and urine cultures  and other workup that proved no other etiology of infection.  She did  have an abdominal drain for the first 10 days but it did not become  bilious and there was no evidence of any other abnormality so that was  ultimately removed.   She did have persistent leukocytosis.  She has allergies to numerous  medication and this was switched around.  She was initially on  clindamycin and Flagyl, was weaned off and then she felt a little worse  with ileus so I switched her over to aztreonam and Flagyl for a 5-day  course.  She  had no evidence of infection elsewhere and with a more  recent negative CAT scan went ahead and stopped it.  Her leukocytosis  gradually resolved and came near normal by the time of discharge.  She  remained afebrile.   Her colonic ileus gradually resolved and she started having a lot of  loose stools and diarrhea in her last week of admission.  She started  advancing her diet.  She did have some early satiety but this seemed to  markedly improve.  She had a lot of flatus and her abdominal distention  markedly improved as well.  We did try a rectal __________two times and  that helped evacuate a lot of flatus and then she would have a lot of  bowel movements.   She also had issues with urinary retention and had to have the Foley  catheter replaced.  She did have some renal failure with decreased urine  output.  I initially tried to diurese her but her creatinine snuck up so  I held off and later in her hospital stay I retried, when her creatinine  started spontaneously coming down, and I was able to  diurese several  liters off.  She had significant leg swelling which had improved by the  time of discharge.  She did have some hypertension but we put her back  on metoprolol and it did improve.   Based on improvements, with her being able to walk in the hallways with  a walker and physical therapy assistance, tolerating solid foods, having  loose bowel movements but not debilitating diarrhea by the time of  discharge (with negative C.diff and X-rays improved), a creatinine near  her baseline of 1.2-1.5 (1.3), and a normal white count off antibiotics  for several days with no deterioration, I thought it would be reasonable  to discharge her home with the following instructions:   1. She is to return to clinic to see me in about 7-10 days for close      follow-up, given her complicated hospital course.  2. She should call if she has any fevers, chills, sweats, nausea,      vomiting, worsening pain, controlled diarrhea or other issues.  3. She should take MiraLax as needed for constipation but take some      Citrucel and drink plenty of fluids and walk regularly.  4. She should resume her home medications, which include:      a.     Ambien CR 12.5 mg nightly.      b.     Synthroid 75 mcg daily.      c.     Premarin 0.3 mg daily.      d.     Toprol XL 50 mg daily.      e.     Hydrocortisone eardrops p.r.n. pain.      f.     Patanol eye drops 0.1% as needed.      g.     Zyrtec 10 mg daily.      h.     EpiPen p.r.n., given her numerous other allergies.      i.     Allopurinol 100 mg daily.      j.     Potassium 20 mg p.r.n. hypokalemia.      k.     Iron daily.      l.     Glucosamine daily.      m.     She should also for pain  control use an ice pack or heating       pad every 2 hours p.r.n. pain.      n.     Tylenol 325-650 mg q.6 h p.r.n. pain.      o.     Oxycodone 5-10 mg p.r.n. pain.      p.     Phenergan 25 mg PR or p.o. q.6 h p.r.n. nausea.   Instruction sheets were given to  her.  Physical therapy saw her and  thought she would benefit from physical therapy, will set that up as  well.      Adin Hector, MD  Electronically Signed     SCG/MEDQ  D:  12/28/2008  T:  12/28/2008  Job:  [id number]   cc:   Janalyn Rouse, M.D.  Fax: [phone]

## 2011-02-24 NOTE — Consult Note (Signed)
Becky Hobbs, Becky Hobbs NO.:  [id number]   MEDICAL RECORD NO.:  [id number]          PATIENT TYPE:  INP   LOCATION:  1523                         FACILITY:  [facility]   PHYSICIAN:  Janalyn Rouse, M.D.  DATE OF BIRTH:  09-06-1946   DATE OF CONSULTATION:  DATE OF DISCHARGE:                                 CONSULTATION   REASON FOR CONSULTATION:  Nausea, vomiting, and diarrhea.   HISTORY OF PRESENT ILLNESS:  Ms. Bowley is a 65 year old African American  female with a history of type 2 diabetes, diet-controlled, hypertension,  and recent acute gangrenous cholecystitis status post laparoscopic  cholecystectomy (December 13, 2008), who presented to the emergency  department on January 12, 2009, with protracted nausea, vomiting, and  diarrhea for less than 24 hours.  The patient had a recent complicated  course following subacute presentation of right upper quadrant pain.  She underwent laparoscopic cholecystectomy on December 13, 2008, with  postoperative complications including ileus, acute on chronic renal  insufficiency, and peripheral edema.  Her hospitalization was prolonged,  but she was discharged on December 28, 2008, in improved condition.  She  did continue to have some edema and weakness following her  hospitalization and was evaluated in our office on December 31, 2008, at  which time she was started on torsemide with improvement in her edema.  She called back on January 03, 2009, complaining of cough and sinus  congestion.  She was prescribed a Z-Pak, Mucinex, and Singulair.  She  reports that she did develop some abdominal pain which she related to  the cough at that time, but was overall improved.  On January 12, 2009, she  developed acute onset nausea and vomiting, followed by multiple episodes  of diarrhea.  She states that she was unable to keep any liquids or  pills down.  She did try taking Phenergan, but was unable to keep this  down.  She presented to the emergency  department, where a CT was  negative.  Lipase was mildly elevated.  She was admitted by General  Surgery for possible pancreatitis.  She states that she does feel much  better now.  She is only eating ice chips, but has not had any nausea,  vomiting, or diarrhea for the past 24 hours.   PAST MEDICAL HISTORY:  1. Gangrenous cholecystitis, status post laparoscopic cholecystectomy      (December 13, 2008, as above).  2. Type 2 diabetes, diet-controlled (A1c 6.7 in October 2009).  3. Hypertension.  4. Asthma.  5. Gout.  6. 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  7. Hypothyroidism.  8. Right breast cyst, status post excision (March 123XX123), complicated      by hematoma.  9. Gastroesophageal reflux disease, status post Nissen fundoplication      (30 years ago).  10.Status post appendectomy.  11.Status post total abdominal hysterectomy/bilateral salpingo-      oophorectomy.  12.Status post bladder tack.  13.Left ankle fracture, status post surgical repair.   MEDICATIONS AT HOME:  1. Synthroid 75 mcg daily.  2. Premarin 0.3 mg daily.  3. Toprol XL  50 mg daily.  4. Allopurinol 100 mg daily.  5. Ambien CR 12.5 mg daily.  6. Torsemide 20 mg daily.  7. Asmanex 220 mcg daily as needed.  8. Patanol eye drops.  9. Oxycodone 5 mg daily p.r.n.--using sparingly.  10.Z-Pak, Mucinex DM, and Singulair all started on January 04, 2009.   ALLERGIES:  1. PENICILLIN.  2. ERYTHROMYCIN.  3. CIPRO.  4. BEES.  5. NSAIDS, resulting in acute renal failure.   FAMILY HISTORY:  Father had alcoholism and stroke.  Mother had diabetes  and hypertension.   SOCIAL HISTORY:  She is a widow.  She has 2 sons in [place]. 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  She has 1 sister who lives in  [place].  She has a minimal smoking history for 20 years, but quit  1994.  Rare alcohol use.   REVIEW OF SYSTEMS:  All systems reviewed with the patient are negative  except in the HPI.   PHYSICAL  EXAMINATION:  VITAL SIGNS:  Temperature 99.6, blood pressure  134 to 162/64 to 85, pulse 86 to 96, respirations 16 to 20, oxygen  saturation 99% on room air.  Ins and outs 3174 mL in and 1450 mL urine  output.  No bowel movements recorded.  GENERAL:  Pleasant African American female in no acute distress.  HEENT:  Oropharynx is moist.  NECK:  Supple without lymphadenopathy, JVD, or carotid bruits.  HEART:  Regular rate and rhythm without murmurs, rubs, or gallops.  LUNGS:  Clear to auscultation bilaterally.  ABDOMEN:  Soft, nondistended with normoactive bowel sounds.  She has  minimal epigastric tenderness, but no rebound or guarding.   LABORATORY DATA:  CBC shows a white count of 7.5, hemoglobin 11.3,  platelets 339.  BMET significant for sodium 138, potassium 4.3, chloride  108, bicarb 23, BUN 5, creatinine 1.0, glucose 122.  Urinalysis is  negative.  Lipase 93, which is improved from 172 on admission, amylase  was 121 on January 13, 2009.  Liver function tests on January 12, 2009, were  normal with an albumin of 3.4.   STUDIES:  1. CT of the abdomen and pelvis with contrast (January 12, 2009):  Small      amount of fluid in the gallbladder fossa with adjacent hepatic      flexure slightly thickened wall with slight stranding, cannot      exclude a postoperative infection, but likely postoperative      changes.  Otherwise normal.  2. HIDA (January 14, 2009):  Normal biliary patency without findings to      suggest a bile leak.   ASSESSMENT/PLAN:  1. Nausea, vomiting, and diarrhea--this has improved since admission.      Given the negative CT findings and HIDA scan findings and lack of      fever and leukocytosis, I suspect that this is either medication      related (Z-Pak, torsemide, and Singulair have all done recently      added) or acute viral gastroenteritis with the outbreak of      Norovirus in the community.  This is less likely a complication of      her surgery, given the normal  HIDA and CT scan and her lipase is      only mildly increased with no findings of acute pancreatitis on the      CT.  This is improving with supportive therapy per General Surgery.      I do doubt  that she has gastroparesis, as this has been acute in      presentation and she has had well-controlled diet-controlled      diabetes in the past, making diabetic gastroparesis less likely.      Will defer additional workup, diet, and discharge plans to General      Surgery, but at this time I would recommend continued supportive      therapy.  2. Edema--this has resolved.  Will hold torsemide for now and use as      needed in the future.  3. Type 2 diabetes--her A1c is pending.  Continue low-carbohydrate      diet.  4. Hypertension--blood pressure has increased over the past 24 hours.      Will monitor on her home dose of Toprol XL 50 mg daily and escalate      as needed if her blood pressure remains elevated.   I appreciate the opportunity to participate in the care of this patient  and greatly appreciate General Surgery management of her recent  cholecystitis and postoperative issues.  I will follow her closely with  you and help to transition to the outpatient setting.      Janalyn Rouse, M.D.  Electronically Signed     WS/MEDQ  D:  01/15/2009  T:  01/15/2009  Job:  [id number]   cc:   Adin Hector, MD  [place] [id number]

## 2011-02-24 NOTE — Procedures (Signed)
DUPLEX DEEP VENOUS EXAM - LOWER EXTREMITY   INDICATION:  Bilateral lower extremity edema and pain.   HISTORY:  Edema:  Severe bilateral lower extremity edema.  Trauma/Surgery:  Gallbladder surgery on 12/13/08.  Patient left the  hospital on 12/30/08.  Pain:  Severe bilateral lower extremity pain.  PE:  No.  Previous DVT:  No.  Anticoagulants:  The patient was on Lovenox prophylactically during her  hospital stay.  Other:   DUPLEX EXAM:                CFV   SFV   PopV  PTV    GSV                R  L  R  L  R  L  R   L  R  L  Thrombosis    o  o  o  o  o  o  o   o  o  o  Spontaneous   +  +  +  +  +  +  +   +  +  +  Phasic        +  +  +  +  +  +  +   +  +  +  Augmentation  +  +  +  +  +  +  +   +  +  +  Compressible  +  +  +  +  +  +  +   +  +  +  Competent     +  P  +  +  +  +  +   +  +  P   Legend:  + - yes  o - no  p - partial  D - decreased   IMPRESSION:  1. No evidence of deep or superficial vein thrombosis.  2. No evidence of baker's cyst bilaterally.  3. Mild reflux is seen in the left common femoral vein and      saphenofemoral junction.    _____________________________  Jessy Oto Fields, MD   MC/MEDQ  D:  12/31/2008  T:  12/31/2008  Job:  [id number]

## 2011-02-24 NOTE — Op Note (Signed)
[name], Becky NO.:  [id number]   MEDICAL RECORD NO.:  [id number]          PATIENT TYPE:  INP   LOCATION:  2550                         FACILITY:  [facility]   PHYSICIAN:  Imogene Burn. Georgette Dover, M.D. DATE OF BIRTH:  1946/07/10   DATE OF PROCEDURE:  12/18/2007  DATE OF DISCHARGE:                               OPERATIVE REPORT   PREOPERATIVE DIAGNOSIS:  Right breast hematoma.   POSTOPERATIVE DIAGNOSIS:  Right breast hematoma.   PROCEDURE PERFORMED:  Evacuation of right breast hematoma.   SURGEON:  Imogene Burn. Tsuei, M.D.   ANESTHESIA:  General via LMA.   INDICATIONS:  The patient is a 65 year old female who presents with a  large hematoma after having a right breast lumpectomy on December 16, 2007  by Dr. Excell Seltzer.  The wound has dehisced and she started having bloody  drainage from the wound.  She presents to the emergency apartment and I  examined her and determined that she should go to the operating room for  evacuation of this area.   DESCRIPTION OF PROCEDURE:  The patient was brought to the operating  room, placed in the supine position on the operating table.  After  adequate level of general anesthesia was obtained, the patient's right  breast was prepped with Betadine and draped in sterile fashion.  A time-  out was taken assure proper patient, proper procedure.  She had been  given preoperative antibiotics.  Her incision had already opened.  There  was a large amount of clot which was expressed.  We filled a kidney  basin full of blood clot.  The wound was then thoroughly irrigated and  washed  out any adherent clot.  We debrided as much as possible.  We  then spent about an hour carefully inspecting the entire lumpectomy  wound for hemostasis.  We cauterized thoroughly.  It seemed that every  time we thought that all oozing was stopped, waiting and careful  inspection showed a new area of oozing.  We persisted in trying to dry  up the entire wound.  When  I was finally satisfied with the hemostasis,  we placed a piece of Surgicel in the base of the wound and held pressure  with a laparotomy sponge for five minutes.  The sponge was then removed  and the Surgicel was dry.  I left the Surgicel in place.  The wound was  closed with a deep layer of 3-0 Vicryl and a subcuticular layer 4-0  Monocryl.  Steri-Strips and clean dressings were applied.  As the  patient was then extubated and brought to recovery in stable condition.  All sponge, instrument, and needle counts correct.      Imogene Burn. Tsuei, M.D.  Electronically Signed     MKT/MEDQ  D:  12/18/2007  T:  12/19/2007  Job:  [id number]

## 2011-02-24 NOTE — H&P (Signed)
[name], Hobbs NO.:  [id number]   MEDICAL RECORD NO.:  [id number]          PATIENT TYPE:  EMS   LOCATION:  ED                           FACILITY:  [facility]   PHYSICIAN:  Isabel Caprice. Hassell Done, MD  DATE OF BIRTH:  1946-01-22   DATE OF ADMISSION:  01/12/2009  DATE OF DISCHARGE:                              HISTORY & PHYSICAL   CHIEF COMPLAINT:  Right upper quadrant abdominal pain with nausea,  vomiting and loose stools today.   HISTORY:  Becky Hobbs is a 65 year old African American lady who  underwent a laparoscopic cholecystectomy for what was felt to be thick-  walled gallbladder with cholecystitis back on December 13, 2008.  Gallbladder was removed and there was some evidence that there was a  right hepatic branch that may have been an accessory branch, but  required ligation in the gallbladder bed during the course of this  really inflamed cholecystectomy.  The patient has been managed at home  for the last few weeks but has had some problems with fluid retention  and edema requiring Lasix therapy.  She had a DVT scan  for that and no  evidence of DVT was noted.  She presented to the emergency room and was  evaluated by the ED doctors for this right upper quadrant pain and I was  asked to see her.  Her CT scan is pending, but I reviewed a preliminary  review and did not see any evidence of subhepatic fluid.  The lipase was  elevated, indicating a probable mild pancreatitis.   The patient is admitted for IV hydration and observation.   PAST MEDICAL HISTORY:  Relates a longstanding history of  gastroesophageal reflux, status post open Nissen fundoplication 30 years  ago.  She takes  antacids periodically.  She has underlying  hypertension.  She has had some right breast biopsies by [facility]  ensuing at [facility].  She has a history of gout.  She  has a history of hypothyroidism for which she takes Synthroid.  In  addition to her Nissen, she has  had C-sections, ovarian cystectomy,  abdominal hysterectomy, bladder tacking, in addition to the right breast  biopsy.   ALLERGIES:  PENICILLIN, ERYTHROMYCIN, VICODIN, DARVOCET, AND CELEBREX.   CURRENT MEDICATIONS:  Include:  1. Ambien.  2. Synthroid 75 mcg daily.  3. Premarin 0.3 mg daily.  4. Toprol XL 50 mg daily.  5. Eye drops.  6. Zyrtec.  7. Allopurinol 100 mg p.o. daily for gout.  8. She has been on some potassium supplement.   SOCIAL HISTORY:  The patient has lived here and most of her family are  up in [place].  She is here with a friend.   FAMILY HISTORY:  Noncontributory.   REVIEW OF SYSTEMS:  GENERAL:  She has had some weight loss.  She has  been nauseated ever since she was discharged from hospital.  EYES:  Negative.  ENT: Negative.  CARDIAC:  Evidence of fluid retention,  orthopnea.  ABDOMEN:  Some tenderness and right upper quadrant pain.  NEUROLOGIC:  Alert and oriented x3.  Motor and sensory function are  grossly intact.   PHYSICAL EXAMINATION:  VITAL SIGN:  Afebrile.  Pulse 98.  Respirations  18.  Blood pressure 197/80.  HEENT:  Head normocephalic.  Eyes, sclerae are nonicteric.  Pupils are  equal, round, reactive to light.  Nose and throat exam unremarkable.  NECK:  Supple, without adenopathy.  CHEST:  Clear.  HEART:  Sinus rhythm in sinus tachycardia.  ABDOMEN:  More tender in the right upper quadrant.  She has well-healed  laparoscopic incisions and an old midline incision.  No rebound or  guarding is noted.  PELVIC:  Exam was not performed.  EXTREMITIES:  She has mild swelling, but full range of motion.  NEUROLOGIC:  Alert and oriented x3.  Motor and sensory function grossly  intact.  PSYCHIATRIC:  She has a very appropriate affect.   IMPRESSION:  Mild pancreatitis, after difficult biliary surgery less  than 1 month ago.   PLAN:  Admit, hydrate.  Recheck lipase and observe.      Isabel Caprice Hassell Done, MD  Electronically Signed     MBM/MEDQ   D:  01/12/2009  T:  01/12/2009  Job:  [id number]   cc:   Janalyn Rouse, M.D.  Fax: [phone]

## 2011-02-24 NOTE — Discharge Summary (Signed)
NAMEDRAVEN, MERANTE NO.:  [id number]   MEDICAL RECORD NO.:  [id number]          PATIENT TYPE:  INP   LOCATION:  [id number]                         FACILITY:  [facility]   PHYSICIAN:  Adin Hector, MD     DATE OF BIRTH:  [birth year]   DATE OF ADMISSION:  01/12/2009  DATE OF DISCHARGE:  01/20/2009                               DISCHARGE SUMMARY   PRIMARY CARE PHYSICIAN:  Janalyn Rouse, M.D.   GASTROENTEROLOGIST:  Elyse Jarvis. Amedeo Plenty, M.D.   SURGEON:  Adin Hector, M.D.   PRINCIPAL AND FINAL DISCHARGE DIAGNOSES:  1. 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  2. 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  3. Sinusitis, improved.  4. Hypertension.  5. Diabetes borderline diet controlled.  6. Mild hyperkalemia correcting.  7. History of renal insufficiency/renal failure resolving.  8. Hypothyroidism.  9. Gout.  10.Asthma.  11.Right breast cyst status post excision complicated by postop      hematoma.  12.Gastroesophageal reflux disease status post Nissen fundoplication.  13.Gastritis on endoscopy by [facility] back in 2006.  14.Status post appendectomy.  15.Status post hysterectomy with bilateral salpingo-oophorectomy.  16.Status post bladder tacking with some persistent difficulty with      urinary retention and leg swelling.  17.Left ankle fracture status post surgical repair.   DISCHARGE MEDICATIONS:  1. Norvasc 10 mg p.o. daily.  2. Metoprolol up to 100 mg daily.  3. Premarin 0.2 mg daily.  4. Synthroid 75 mcg daily.  5. Protonix 40 mg p.o. b.i.d. for at least 2 weeks of not longer and      probably daily thereafter with history of gastritis.  6. Tylenol p.r.n. headaches.  7. Multivitamin daily.  8. Potassium 20 mEq daily for a week.  9. Claritin over-the-counter 10 mg daily for sinus symptoms.  10.Reglan 10 mg p.o. q.a.c. and q.h.s. for at least x2 weeks.  11.Allopurinol 100 mg daily.  12.Ambien CR  12.5 mg q.h.s.  13.Furosemide 20 mg p.r.n. leg swelling.  14.Nasonex 2 inhalations p.r.n. asthma.  15.Flonase 2 sprays q.h.s.  16.MiraLax 8.5 g (half dose) q.Tuesday, Thursday, Saturday, adjust as      needed for bowel movement every day.   HOSPITAL COURSE:  Ms. Stepien is a 65 year old obese female with numerous  health issues who has been struggling with bowel function since an  emergent gangrenous cholecystectomy.  She had a few days of severe  nausea and vomiting and came to the emergency room dehydrated.  Surgery  was convinced to admit her.  She was hydrated.  Her nausea was more  aggressively controlled.  She claims she had diarrhea before she came in  but she had no bowel movement for the first week.  We started her on  MiraLax on a daily basis and that started to open up.  We have lowered  it to every other day and to half dose every other day.   She had a CT scan which showed no evidence of any abscess or leak.  She  had a HIDA scan which showed  no evidence of any leak or obstruction.  She had an MRCP which showed no evidence of any pancreatitis despite  some mildly elevated lipase.  There was no evidence of any biliary mass  or obstruction or leak of concern.   She had a gastric emptying study which showed delayed gastric emptying.  She was started on Reglan and this seemed to help markedly in  controlling her nausea and vomiting.  By the time of discharge she was  walking in the hallways, her abdominal pain had resolved, she was no  longer having nausea and vomiting and she was starting to tolerate food  relatively well.   Her diarrhea seemed to have calmed down and then it totally resolved.  Her heartburn reflux is better controlled on the oral Protonix.  The  patient has improved and so it is reasonable to discharge home with the  following instructions:   DISCHARGE INSTRUCTIONS:  1. She is to take the medications as prescribed above.  She should      call if she has  worsening fevers, chills, sweats, nausea, vomiting,      diarrhea or abdominal pain.  2. She should follow up with Dr. Brigitte Pulse in the next month or so for the      recent adjustments to her hypertension and make sure she is not      having significant diabetes that warrants medication.  3. She should follow up with Dr. Amedeo Plenty at [facility] (or      Dr. Juanita Craver since Dr. Collene Mares is a friend of hers) for followup on      her issues of chronic ileus/dysmotility with intermittent      constipation and diarrhea as well as delayed gastric emptying      presumably secondary to a gastroparesis from diabetes improved on      Reglan.  Dr. Wilford Corner did not want the patient on Reglan      more than 2 weeks.  I will defer to GI whether they wish to resume      or switch to something else should she have persistent symptoms.      She may require an esophagogastroduodenoscopy to see if she has      evidence of gastritis.  Until then she should be on Protonix for at      least the next couple of weeks and until she can see      gastroenterology and they can help determine whether she should      continue that or not.   At this point I think she is far enough out that the risk of bile leak  or abscess or other issues secondary to her cholecystectomy are gone.  There is some question of mild pancreatitis by elevated lipase but there  is no evidence of  it by symptomatology (her pain was in the right upper quadrant along the  chest wall not really in the back) and normal CT and normal MRCP as  well.  She might have had a reaction to the Lasix and had a transient  pancreatitis secondary to this but that is a weak diagnosis at this time  until something else can come up.      Adin Hector, MD  Electronically Signed     SCG/MEDQ  D:  01/20/2009  T:  01/20/2009  Job:  [id number]   cc:   Nelwyn Salisbury, M.D.  Fax: [phone]   Elyse Jarvis. Amedeo Plenty, M.D.  Fax:  [phone]   Janalyn Rouse,  M.D.  Fax: [phone]

## 2011-02-24 NOTE — Discharge Summary (Signed)
[name], Becky Hobbs NO.:  [id number]   MEDICAL RECORD NO.:  [id number]          PATIENT TYPE:  INP   LOCATION:  37                         FACILITY:  [facility]   PHYSICIAN:  Adin Hector, MD     DATE OF BIRTH:  1946-07-28   DATE OF ADMISSION:  12/13/2008  DATE OF DISCHARGE:                               DISCHARGE SUMMARY   PRIMARY CARE PHYSICIAN:  Janalyn Rouse, MD.   SURGEON:  Adin Hector, MD.   MAIN DIAGNOSIS:  Gangrenous cholecystitis with impairment of the  gallbladder.   OTHER DIAGNOSES:  1. Postoperative colonic ileus secondary to above.  2. Acute renal failure on chronic renal insufficiency, resolved.  3. Gastroesophageal reflux disease, status post open Nissen      fundoplication 30 years ago.  4. Hypertension.  5. Chronic gastritis.  6. 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  7. Right breast papilloma, status post breast biopsy and hematoma,      followed by Dr. Excell Seltzer and Dr. Georgette Dover, my group.  8. Gout.  9. Hypothyroidism.  10.Status post cesarean section.  11.Status post ovarian cystectomy.  12.Status post abdominal hysterectomy.  13.Status post bladder tack 2000.  14.Status post foot surgery.  15.Status post open right breast biopsy.   DICTATION ENDS HERE      Adin Hector, MD     SCG/MEDQ  D:  12/28/2008  T:  12/28/2008  Job:  [id number]

## 2011-02-24 NOTE — Consult Note (Signed)
Becky Hobbs, BISON NO.:  [id number]   MEDICAL RECORD NO.:  [id number]          PATIENT TYPE:  INP   LOCATION:  1523                         FACILITY:  [facility]   PHYSICIAN:  James L. Oletta Lamas, M.D. DATE OF BIRTH:  [birth year]   DATE OF CONSULTATION:  01/16/2009  DATE OF DISCHARGE:                                 CONSULTATION   We were asked to see Becky Hobbs today in consultation for nausea,  vomiting, and abdominal pain by Dr. Adin Hector of the [facility].  Today's date is January 16, 2009.   HISTORY OF PRESENT ILLNESS:  Becky Hobbs is a very pleasant 65 year old  female who is status post complex laparoscopic cholecystectomy March 4.  She had a difficult postop course including acute renal failure and  colonic ileus.  She was discharged from [facility] system on  March 19.  She tells me that she went home and felt better.  About a  week later she developed a sinus headache, and was seen on March 22 and  treated with Z-Pak, Singulair, and Mucinex.  She then started vomiting  unexpectedly on Saturday, April 3, and came to the ED.  She also had  diarrhea that day, but has had no diarrhea since.  She is vomiting p.o.  She tells me that she keeps food down for a little while, and then will  vomit it back up.  She is also very nauseated.  She denies hematemesis,  melena, hematochezia.  She does report abdominal pain that she thinks is  from vomiting.  She says the pain is generally located in the right  upper quadrant rather than the epigastrium.  The patient was found to  have elevated lipase of 172 on admission April 3 which is now down into  the 90s.  She also had some colonic wall thickening and stranding  adjacent to the gallbladder fossa on CT scan.  She denies any fever,  palpitations or shortness of breath.  She lives alone.  Has had no sick  contacts.   Her last colonoscopy was in May 2005 by Dr. Teena Irani.  It was within  normal limits.  Her next colonoscopy was recommended in 10 years.  Her  last upper endoscopy was in April 2006.  She had moderately severe  antral gastritis and mild duodenitis; otherwise, within normal limits.   PAST MEDICAL HISTORY:  Significant for:   1. Gangrenous cholecystitis status post laparoscopic cholecystectomy      December 13, 2008.  2. Type 2 diabetes diet-controlled.  3. Hypertension.  4. Asthma.  5. Gout.  6. 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  7. Hypothyroidism.   She has had multiple surgeries including:   1. A Nissen fundoplication.  2. Total abdominal hysterectomy, bilateral salpingo-oophorectomy.  3. Appendectomy.  4. Bladder tack.  5. Surgical repair of left ankle fracture.  6. Right breast cyst excision.   MEDICATIONS AT THE TIME OF ADMISSION:  Z-Pak, Singulair, Mucinex.  These  were started March 26.  Also Synthroid, Premarin, Toprol, allopurinol,  Ambien, furosemide, Asmanex, Patanol  eye drops, and oxycodone.   She has allergies to:   1. PENICILLIN.  2. E-MYCIN.  3. CIPRO.  4. NSAIDS.  5. BEE STINGS.   REVIEW OF SYSTEMS:  As per HPI.   SOCIAL HISTORY:  She is an ex-smoker, rarely drinks alcohol, and is  retired from [employer].   FAMILY HISTORY:  Significant for ulcers in her mother.  There is no  colon cancer in the family.   PHYSICAL EXAMINATION:  She is alert, oriented in no apparent distress.  Temperature is 98.2, pulse 87, respirations 20, blood pressure is  174/83.  HEART:  Has regular rate and rhythm.  LUNGS:  Clear.  ABDOMEN:  Soft, nontender, nondistended with good bowel sounds.  She  does have multiple well-healed scars.   LABORATORIES:  Show a hemoglobin of 11.3, hematocrit 33.3, white count  7.5, platelets 339,000.  BUN 5, creatinine 0.99.  LFTs checked April 3  were completely within normal limits.  Her lipase is 93.  She was  checked for C. difficile on March 19 and 20.  This was negative.  Radiological exams over the  last month have included a negative  intraoperative cholangiogram on March 4.  On March 9 she had a CT  abdomen that showed severe colonic ileus.  On March 20 she had a  radiological exam that showed small and large bowel dilation consistent  with residual ileus.  On April 3 she had a CT abdomen and pelvis that  showed:   1. Small amount of fluid in the gallbladder fossa.  2. Colonic hepatic flexure thickening adjacent to the gallbladder      fossa, also slight stranding in the area.   On April 5 she had a HIDA scan that was negative for bile leak.  On  April 7 she had a gastric emptying scan done.  Results are pending.   ASSESSMENT:  Dr. Laurence Spates has seen and examined the patient,  collected a history, and reviewed her chart.  His impression is that  this is a 64 year old female with nausea, vomiting, abdominal pain one  month post complicated cholecystectomy.  She has mild pancreatitis at  this time.  Could be complicated with viral gastroenteritis.  Could also  have some upper tract etiology including gastritis or an ulcer.  Doubt  common bile duct obstruction given her negative intraoperative  cholangiogram and normal labs.  Will trial proton pump inhibitor  therapy.  Trial low residue, nonfat solids.  If no improvement over the  next day or so, would recommend upper endoscopy to evaluate her upper  tract.   Thanks very much for this consultation.      Melton Alar, PA    ______________________________  Joyice Faster Oletta Lamas, M.D.    MLY/MEDQ  D:  01/16/2009  T:  01/16/2009  Job:  [id number]   cc:   Adin Hector, MD  [place] [phone]   John C. Amedeo Plenty, M.D.  Fax: [phone]

## 2011-02-24 NOTE — Op Note (Signed)
Hobbs, Becky NO.:  [id number]   MEDICAL RECORD NO.:  [id number]          PATIENT TYPE:  INP   LOCATION:  [id number]                         FACILITY:  [facility]   PHYSICIAN:  Adin Hector, MD     DATE OF BIRTH:  1946/03/07   DATE OF PROCEDURE:  12/13/2008  DATE OF DISCHARGE:                               OPERATIVE REPORT   PRIMARY CARE PHYSICIAN:  Dr. Dione Housekeeper.   GASTROENTEROLOGIST:  Dr. Teena Irani.   SURGEON:  Dr. Michael Boston.   ASSISTANT:  None.   PREOPERATIVE DIAGNOSIS:  Acute cholecystitis.   POSTOPERATIVE DIAGNOSIS:  Acute cholecystitis with gangrene and empyema  of the gallbladder.   PROCEDURE PERFORMED:  1. Laparoscopic lysis of adhesions x45 minutes which is 1/3 of the      case.  2. Laparoscopic cholecystectomy.  3. Intraoperative cholangiogram.  4. Ligation of proximal inferolateral right extrahepatic bile duct      branch (going to segment 5/6).   ANESTHESIA:  1. General anesthesia.  2. Local anesthetic field block around all port sites.   ESTIMATED BLOOD LOSS:  750 mL.   DRAINS:  19-French drain.  Tips rests in the retrohepatic fossa and then  runs over the porta hepatis in the gallbladder fossa and comes out the  right upper quadrant.   COMPLICATIONS:  Tear on the branch on the right extrahepatic bile duct,  controlled with ligation on liver and right hepatic bile duct side.  See  details below.   INDICATIONS:  Becky Hobbs is a 65 year old obese female with intermittent  history of biliary colic who has had a 4-day history of worsening  abdominal pain, nausea, vomiting and a Murphy's sign.  She was evaluated  by her primary care physician and had an elevated white count.  Ultrasound showed gallbladder wall thickening concerning for acute  cholecystitis with sonographic Murphy's sign.  He called me.   The patient was sent to [facility], and evaluation was made.  She had good cardiopulmonary clearance.  Recommendation  was made for  surgery.  The anatomy and physiology of hepatobiliary and pancreatic  function was discussed.  Pathophysiology of cholecystolithiasis with the  risk of cholecystitis and other risks were discussed in detail.  Options  were discussed and recommendations made for laparoscopic, possible open  cholecystectomy with intraoperative cholangiogram.  Risks, benefits, and  alternatives discussed.  Questions answered.  She and her friend agreed  to proceed.   FINDINGS:  She had evidence of gallbladder wall necrosis and thickening.  She had empyema of the gallbladder.  Her gallbladder was very  intrahepatic.  Her transverse colon and transverse mesocolon were  densely adherent to the gallbladder.   There was evidence for tear on a branch of the right extrahepatic bile  duct.  This was ligated laparoscopically.  Final cholangiogram showed  main right hepatic duct intact and appropriate ligation of leaking area  and no other abnormalities.   DESCRIPTION OF PROCEDURE:  Informed consent was confirmed.  Given her  allergy to the other antibiotics, she was placed on IV Cipro and  clindamycin.  She had sequential compression devices active during the  entire case.  She underwent general anesthesia without any difficulty.  She was positioned supine with arms tucked.  A Foley catheter was  sterilely placed.  Her abdomen was clipped, prepped and draped in  sterile fashion.  Surgical time-out confirmed our plan.   A #5-mm port was placed in the right upper quadrant using optical entry  technique with the patient in steep reversed Trendelenburg and right-  side up given her history of numerous abdominal surgeries and midline  incisions.  Camera inspection revealed no intra-abdominal injury.  Under  direct visualization, a 5-mm port was placed through the prior  infraumbilical curvilinear incision, directed to the right of midline,  away from the midline adhesions.  Another 5-mm port was placed  in the  right flank.  A 10-mm port was placed and tunneled through the falciform  ligament in an oblique fashion.   Camera inspection noted transverse colon was densely adherent to the  liver edge on the right and medial left lobes.  Careful dissection using  primarily blunt and hydrodissection using an irrigator/sucker helped  free the transverse colon gently off to the dome of the gallbladder.  I  aspirated frank pus out of the gallbladder dome with a laparoscopic  needle aspirator to decompress the gallbladder.  Gallbladder was grasped  and elevated more cephalad.  Carefully over time, I was able to free off  the transverse colon, mesocolon and other structures off the gallbladder  until I got down to the infundibulum.  Hook cautery was used to free off  the gallbladder from its attachment onto the liver bed starting at the  gallbladder halfway up and coming down on the anteromedial wall and the  posterior lateral wall.  Careful dissection was done using  hydrodissection and blunt dissection as well as some focused dissection  to help get good mobilization of the proximal half of the gallbladder  off the liver bed.  I ended up a little more cephalad of the gallbladder  sides and getting about proximal 2/3 of the gallbladder off the liver  bed just to get better mobilization.  It was a very intrahepatic  gallbladder, so dissection off the liver was performed until I could  better mobilize it.   In dissecting, I could come down to the infundibulum and see the off  take of the infundibulum to the cystic duct.  Careful dissection was  done as to isolate the cystic duct and free it down towards the porta  hepatis.  Further dissection was done to try and localize the cystic  arterial branches.  There was a lot of inflammmation & thickened  tissues,  but with careful skeletonization, I was able to find the  anterior branch of the cystic artery.  It was coming right into the  gallbladder,  and 1 clip on the gallbladder side and 2 clips on the  proximal side were placed.  Another candidate was placed on the  posterolateral wall for the posterior cystic artery branch, and this was  isolated and ligated in a similar fashion.   Further meticulous dissection was done to get a good view and make sure  that I had a good critical view of the cystic duct.  This was  skeletonized and isolated.  Two clips were placed in the infundibulum,  and partial cystic ductotomy was performed at the infundibulum.  However, I had opened right into a valve, so therefore I went  about 4 mm  more proximally on the cystic duct and got a better cystic ductotomy.  I  went back more proximally on the cystic duct and milked back and got  little bit of biliary sand but no major stones.  The cystic duct was  very narrowed.  A 5-French cholangiocatheter was placed through a  subcostal stab incision and placed into the cystic duct.  In trying to  place it, the cystic duct completely tore.  Eventually, I was able to  get the catheter into the cystic duct stump with a clip.   In flushing the cholangiogram, I sensed that there was a leak somewhere.  However, it was not coming from the cystic duct stump.  A careful  inspection revealed a little patch of bile coming from a branch coming  out of the liver that had completely torn.  It was about to 2 mm in  size.  I could find a more proximal end on the bile duct side, and after  careful dissection, I could see that it was a branch coming off the  right extrahepatic bile duct..   A cholangiogram was run using dilute radiopaque contrast and continuous  fluoroscopy.  I had definitely cannulated a corkscrew helical side  branch of the cystic duct going into the main CBD.  Contrast flowed well  down the common bile duct across a normal ampulla into duodenum  consistent with a normal distal cholangiogram.  Refluxing of contrast  more proximally, I could see the right  and left intrahepatic chains, but  there seemed to be a little wisp of contrast coming off the proximal  right hepatic duct off a branch consistent with identified bile duct  injury to a branch off the right hepatic duct.   Because it was rather inflamed with some oozing on the liver bed, I went  ahead and freed the gallbladder off its attachments on the liver bed and  placed an EndoCatch bag and removed out the subxiphoid port was some  gentle dilation.  The fascial defect was reapproximated using 0 Vicryl  stitch using a laparoscopic suture passer in a figure-of-eight fashion  to good result.  There had been spillage of stones since the necrotic  wall fell apart.  The spilled stones were carefully isolated and removed  using a large laparoscopic stone grasper.  Copious irrigation was done,  over 4 liters, and hemostasis was assured on the liver bed using  cautery.  Inspection was made on the transverse mesocolon as well as the  other organs, and there was no evidence of any injury or other  abnormality, and hemostasis was good.   With hemostasis ensured and the gallbladder out, I focused on the bile  duct injury.   The liver was elevated anteriorly, and I could see the base of the porta  hepatis.  Tracing more proximally, I could see the torn 31mm right  hepatic branch coming out of the liver bed, and I was able to isolate  that and come around that about 6 mm.  I was able to ligate using a 0  PDS Endoloop and a clip to good result and good approximation.  On the  right hepatic bile duct side, I went ahead and ligated the branch coming  off the right hepatic duct using a 4-0 PDS on a small taper needle using  laparoscopic intracorporeal suturing to good result.  I did place 3  clips on the tail just to help mark the  area.  Copious irrigation was  done.  I had kept the cholangiogram catheter in the cystic bile duct  stump; so, I flushed it and no longer noticed any leak coming out of  the  area.  Copious irrigation was done, and meticulous reinspection was  made.  There was no evidence of leak of bile any more.  A cholangiogram  was rerun using again dilute radiopaque contrast continuous fluoroscopy.  Contrast flowed well in the right and left intrahepatic chains.  The  more proximal right hepatic side branch came off at a stop.  Inspection  revealed no definite evidence of a leak.  Reinspection of the  gallbladder fossa & portis hepatis area showed no evidence of any leak  of contrast nor bile   Cholangiocatheter was removed out of the cystic duct stump, and cystic  duct stump was ligated using a 0 PDS Endoloop and 3 clips on it with  relatively good results since I was able to get a little more length on  it.  Another 3 liters of irrigation was done with clear return and  hemostasis again ensured on the liver bed.  I placed a 19-French Blake  drain with the tip in the retrohepatic space and then coming up over the  gallbladder fossa and coming out of the right upper quadrant port site.  Capnoperitoneum was evacuated, and ports were removed.  The subxiphoid  fascial stitch was tied down.  Skin was closed with 4-0 Monocryl stitch  at the port sites, and drain was secured using a nylon stitch.  Sterile  dressing was applied.   The patient was extubated and sent to recovery room in stable condition.  I explained operative findings with the patient's family, especially her  sister.  Questions were answered & she expressed understanding &  appreciation.      Adin Hector, MD  Electronically Signed     SCG/MEDQ  D:  12/13/2008  T:  12/13/2008  Job:  [id number]

## 2011-02-27 NOTE — Discharge Summary (Signed)
NAME:  Becky Hobbs, Becky Hobbs                         ACCOUNT NO.:  [id number]   MEDICAL RECORD NO.:  [id number]                   PATIENT TYPE:  INP   LOCATION:  3736                                 FACILITY:  [facility]   PHYSICIAN:  Eustace Quail, M.D.                  DATE OF BIRTH:  08/04/1946   DATE OF ADMISSION:  07/30/2003  DATE OF DISCHARGE:  08/01/2003                           DISCHARGE SUMMARY - REFERRING   DISCHARGE DIAGNOSES:  1. Palpitations, resolved.  2. Shortness of breath, resolved.  3. Hypothyroidism, however, hyperthyroidism by labs, corrected.  4. Normocytic anemia.  5. Elevated blood pressure.   HOSPITAL COURSE:  Becky Hobbs is a 65 year old female who was admitted to  [facility] on July 30, 2003 with an onset of numbness in her  arms and legs, accompanied by shortness of breath, chest heaviness and  lightheadedness.  In the emergency room, EKG changes revealed diffuse ST-T  wave changes with a tachycardia of 117.  She was treated with IV beta  blockers to bring her rate down below 100 beats/min.  Apparently, she has  had a history of a heart murmur and this has been evaluated with an  echocardiogram, and has been taking SBE prophylaxis for a number of years.  She was admitted to [facility] and during this time, she underwent  a rest stress Cardiolite that was negative for ischemia and an EF of 69%.   During her admission, she was noted to have a potassium of 2.3 and this was  corrected to 3.6.  She was also known to have a hemoglobin of 12.9,  hematocrit of 39.4, platelets 357, white count 8.0.  TSH 0.020.  Cardiac  markers were negative.  Total cholesterol was 137, triglycerides 133, HDL  63, LDL 47.  Chest x-ray was normal chest.   Because she has known history of hypothyroidism, she is on Synthroid 175 mcg  a day, which has been decreased because her TSH was extremely low.  We  placed her on 75 mcg one a day.  She will need to see Dr. Alanson Aly in  [place] for recheck within four to six weeks.  She is also to resume a  beta blocker.  We have placed her on Toprol XL 50 mg a day.  She may resume  her Premarin, the only mentioned medication she needs to take at this time.  She needs to restrict from no caffeine or decongestants to avoid increased  heart rate.  She is to call for any questions.       Joesphine Bare, P.A. [facility]                      Eustace Quail, M.D.    LB/MEDQ  D:  08/01/2003  T:  08/01/2003  Job:  [id number]   cc:   Dr. Ophelia Charter, M.D.

## 2011-02-27 NOTE — Consult Note (Signed)
NAMEHEATER, OSUCH NO.:  [id number]   MEDICAL RECORD NO.:  [id number]          PATIENT TYPE:  EMS   LOCATION:  MAJO                         FACILITY:  [facility]   PHYSICIAN:  Mare Loan, MD      DATE OF BIRTH:  1946/06/17   DATE OF CONSULTATION:  DATE OF DISCHARGE:  12/31/2007                                 CONSULTATION   REQUESTING PHYSICIAN:  Dr. Winfred Leeds   [name] is a 65 year old female who apparently had a lumpectomy on  December 16, 2007.  Apparently she was then taken to the operating room by  Dr. Verita Lamb on March 8 for evacuation of a hematoma.  She states she  had been doing fine until 2 days ago she began to have hardness and  swelling in the breast.  She was seen in the office.  However, soon  after seen in the office the next day she began having pain in her right  breast described as sharp and cramping.  The breast also became hard to  the touch and this morning began to have fluid expressed from the  breast.  This was dark in color.  Due to the amount of swelling and  discharge from her breast she came to the emergency department for  evaluation.   PAST MEDICAL HISTORY:  Significant for:  1. Hypothyroidism.  2. Excision of a papilloma.  3. History of arrhythmia.  4. Gout.  5. Hypertension.   SURGICAL HISTORY:  Excision of papilloma.   MEDICATIONS:  Include Premarin, Toprol-XL, Synthroid, allopurinol,  Zyrtec, torsemide and Ambien.  She has been taking Tylox for pain.  She  does have an allergy to VICODIN and PENICILLIN.   SOCIAL HISTORY:  No tobacco, occasional alcohol use.   REVIEW OF SYSTEMS:  As per the HPI.   PHYSICAL EXAMINATION:  Blood pressure 130/70, temperature 97.2, heart  rate 80.   On focused examination of the breast, Steri-Strips are in place.  There  is some dark fluid, rather thick, expressed from the right breast.  I  removed the Steri-Strips, gently probed the area with a cotton-tip  applicator and got out  copious amounts of this what seemed like old  blood.  The breast was not erythematous and edema has calor.  The  liquefied blood continued to be expressed from the breast.  I irrigated  the breast with saline, packed it with iodoform  gauze, instructed the patient to continue packing this daily.  Instructed her to call the office on Monday for a followup and a wound  check.  I did instruct her that it would continue to drain from the  breast.  However, this would in time heal.  I did not think she needed  antibiotics tonight, and she will follow up with Dr. Lear Ng office.      Mare Loan, MD  Electronically Signed     ALA/MEDQ  D:  01/01/2008  T:  01/01/2008  Job:  [id number]

## 2011-02-27 NOTE — Op Note (Signed)
NAMEJASMINEROSE, Becky Hobbs               ACCOUNT NO.:  [id number]   MEDICAL RECORD NO.:  [id number]          PATIENT TYPE:  AMB   LOCATION:  ENDO                         FACILITY:  [facility]   PHYSICIAN:  John C. Amedeo Plenty, M.D.    DATE OF BIRTH:  Sep 02, 1946   DATE OF PROCEDURE:  02/11/2004  DATE OF DISCHARGE:                                 OPERATIVE REPORT   PROCEDURE:  Colonoscopy.   ENDOSCOPIST:  Elyse Jarvis. Amedeo Plenty, M.D.   INDICATIONS FOR PROCEDURE:  Average-risk colon cancer screening.   PROCEDURE:  The patient was placed in the left lateral decubitus position  and placed on the pulse monitor with continuous low-flow oxygen delivered by  nasal cannula.  She was sedated with 100 mcg IV fentanyl and 10 mg IV  Versed.  The Olympus video colonoscope was inserted into the rectum and  advanced to cecum, confirmed by transillumination of McBurney's point  visualization of ileocecal valve and appendiceal orifice.  The prep was  excellent.  The colon was fairly long and redundant, but the scope was  advanced with moderate ease.  There was a mild appearance of melanosis coli  primarily the cecum and rectum; otherwise the cecum, ascending, transverse,  descending, sigmoid and rectum appeared normal with no masses, polyps,  diverticula or other mucosal abnormalities.  The scope was then withdrawn  and the patient returned to the recovery room in stable condition.  She  tolerated the procedure well and there were no immediate complications.   IMPRESSION:  Mild melanosis coli, otherwise normal colonoscopy.   PLAN:  Next colonoscopy in 10 years and consider sigmoidoscopy in 5 years.      JCH/MEDQ  D:  02/10/2005  T:  02/11/2005  Job:  [id number]   cc:   Ralene Bathe. Matthew Saras, M.D.  [address] [place]  [place] [address]  Fax: [phone]

## 2011-02-27 NOTE — Op Note (Signed)
[name], Becky Hobbs               ACCOUNT NO.:  [id number]   MEDICAL RECORD NO.:  [id number]          PATIENT TYPE:  AMB   LOCATION:  ENDO                         FACILITY:  [facility]   PHYSICIAN:  John C. Amedeo Plenty, M.D.    DATE OF BIRTH:  May 07, 1946   DATE OF PROCEDURE:  01/22/2005  DATE OF DISCHARGE:                                 OPERATIVE REPORT   PROCEDURE:  Esophagogastroduodenoscopy.   INDICATIONS FOR PROCEDURE:  Rather vague description of dysphagia and  odynophagia over the last 2 to 3 days, suspected pill or candidal  esophagitis.   PROCEDURE:  The patient was placed in the left lateral decubitus position  and placed on the pulse monitor with continuous low-flow oxygen delivered by  nasal cannula. She was sedated with 62 1/2 mcg IV fentanyl and 6 mg IV  Versed. The Olympus video endoscope was advanced under direct vision into  the oropharynx and esophagus. The esophagus was straight and of normal  caliber with the squamocolumnar line at 38 cm, there is no visible hiatal  hernia, ring, stricture or other abnormality of the GE junction. The stomach  was entered and a small amount of liquid secretions were suctioned from the  fundus. Retroflexed view of the cardia was unremarkable. The fundus and body  appeared normal. Within the antrum, there were some linear erosions with  exudate parallel radiating out for the pylorus consistent with a moderate  gastritis. Biopsies and a CLO-test were obtained. The duodenum was entered  and both the bulb and the second portion showed patchy granularity and  erythema with no discrete erosions consistent with mild duodenitis. The  scope was then withdrawn and the patient returned to the recovery room in  stable condition. She tolerated the procedure well and there were no  immediate complications.   IMPRESSION:  1.  Normal esophagus with no __________ to correlate with her presenting      symptoms.  2.  Moderately severe antral gastritis and  mild duodenitis.   PLAN:  Will treat with proton pump inhibitor and await biopsy results and  CLO-test.      JCH/MEDQ  D:  01/22/2005  T:  01/22/2005  Job:  [id number]

## 2011-07-06 LAB — BASIC METABOLIC PANEL
BUN: 33 — ABNORMAL HIGH
CO2: 27
Calcium: 8.5
Chloride: 101
Creatinine, Ser: 1.83 — ABNORMAL HIGH
GFR calc Af Amer: 34 — ABNORMAL LOW
GFR calc non Af Amer: 28 — ABNORMAL LOW
Glucose, Bld: 92
Potassium: 3.9
Sodium: 135

## 2011-07-06 LAB — CBC
HCT: 31.2 — ABNORMAL LOW
Hemoglobin: 10.5 — ABNORMAL LOW
MCHC: 33.6
MCV: 87.1
Platelets: 313
RBC: 3.59 — ABNORMAL LOW
RDW: 14.8
WBC: 12.2 — ABNORMAL HIGH

## 2011-07-06 LAB — WOUND CULTURE

## 2011-08-31 ENCOUNTER — Other Ambulatory Visit: Payer: Self-pay | Admitting: Specialist

## 2011-08-31 DIAGNOSIS — M549 Dorsalgia, unspecified: Secondary | ICD-10-CM

## 2011-09-07 ENCOUNTER — Ambulatory Visit
Admission: RE | Admit: 2011-09-07 | Discharge: 2011-09-07 | Disposition: A | Discharge status: Home / Self Care | Payer: BC Managed Care – PPO | Source: Ambulatory Visit | Attending: Specialist | Admitting: Specialist

## 2011-09-07 DIAGNOSIS — M549 Dorsalgia, unspecified: Secondary | ICD-10-CM

## 2012-03-01 ENCOUNTER — Encounter (HOSPITAL_COMMUNITY): Payer: Self-pay | Admitting: Pharmacy Technician

## 2012-03-02 ENCOUNTER — Encounter (HOSPITAL_COMMUNITY): Payer: Self-pay

## 2012-03-02 ENCOUNTER — Encounter (HOSPITAL_COMMUNITY)
Admission: RE | Admit: 2012-03-02 | Discharge: 2012-03-02 | Disposition: A | Discharge status: Home / Self Care | Payer: BC Managed Care – PPO | Source: Ambulatory Visit | Attending: Specialist | Admitting: Specialist

## 2012-03-02 ENCOUNTER — Other Ambulatory Visit (HOSPITAL_COMMUNITY): Payer: BC Managed Care – PPO

## 2012-03-02 ENCOUNTER — Inpatient Hospital Stay (HOSPITAL_COMMUNITY): Admission: RE | Admit: 2012-03-02 | Discharge: 2012-03-02 | Payer: BC Managed Care – PPO | Source: Ambulatory Visit

## 2012-03-02 HISTORY — DX: Cardiac murmur, unspecified: R01.1

## 2012-03-02 HISTORY — DX: Encounter for other specified aftercare: Z51.89

## 2012-03-02 HISTORY — DX: Essential (primary) hypertension: I10

## 2012-03-02 HISTORY — DX: Anxiety disorder, unspecified: F41.9

## 2012-03-02 HISTORY — DX: Hypothyroidism, unspecified: E03.9

## 2012-03-02 HISTORY — DX: Personal history of other diseases of the digestive system: Z87.19

## 2012-03-02 HISTORY — DX: Unspecified osteoarthritis, unspecified site: M19.90

## 2012-03-02 HISTORY — DX: Reserved for inherently not codable concepts without codable children: IMO0001

## 2012-03-02 LAB — URINALYSIS, ROUTINE W REFLEX MICROSCOPIC
Glucose, UA: NEGATIVE mg/dL
Hgb urine dipstick: NEGATIVE
Ketones, ur: NEGATIVE mg/dL
Nitrite: NEGATIVE
Protein, ur: NEGATIVE mg/dL
Specific Gravity, Urine: 1.019 (ref 1.005–1.030)
Urobilinogen, UA: 1 mg/dL (ref 0.0–1.0)
pH: 6 (ref 5.0–8.0)

## 2012-03-02 LAB — CBC
HCT: 36 % (ref 36.0–46.0)
Hemoglobin: 11.6 g/dL — ABNORMAL LOW (ref 12.0–15.0)
MCH: 27.8 pg (ref 26.0–34.0)
MCHC: 32.2 g/dL (ref 30.0–36.0)
MCV: 86.1 fL (ref 78.0–100.0)
Platelets: 244 10*3/uL (ref 150–400)
RBC: 4.18 MIL/uL (ref 3.87–5.11)
RDW: 14.5 % (ref 11.5–15.5)
WBC: 8.5 10*3/uL (ref 4.0–10.5)

## 2012-03-02 LAB — BASIC METABOLIC PANEL
BUN: 21 mg/dL (ref 6–23)
CO2: 27 mEq/L (ref 19–32)
Calcium: 9.5 mg/dL (ref 8.4–10.5)
Chloride: 100 mEq/L (ref 96–112)
Creatinine, Ser: 1.46 mg/dL — ABNORMAL HIGH (ref 0.50–1.10)
GFR calc Af Amer: 43 mL/min — ABNORMAL LOW (ref >90)
GFR calc non Af Amer: 37 mL/min — ABNORMAL LOW (ref >90)
Glucose, Bld: 103 mg/dL — ABNORMAL HIGH (ref 70–99)
Potassium: 4.3 mEq/L (ref 3.5–5.1)
Sodium: 138 mEq/L (ref 135–145)

## 2012-03-02 LAB — DIFFERENTIAL
Basophils Absolute: 0 10*3/uL (ref 0.0–0.1)
Basophils Relative: 0 % (ref 0–1)
Eosinophils Absolute: 0.7 10*3/uL (ref 0.0–0.7)
Eosinophils Relative: 8 % — ABNORMAL HIGH (ref 0–5)
Lymphocytes Relative: 43 % (ref 12–46)
Lymphs Abs: 3.6 10*3/uL (ref 0.7–4.0)
Monocytes Absolute: 0.9 10*3/uL (ref 0.1–1.0)
Monocytes Relative: 10 % (ref 3–12)
Neutro Abs: 3.3 10*3/uL (ref 1.7–7.7)
Neutrophils Relative %: 39 % — ABNORMAL LOW (ref 43–77)

## 2012-03-02 LAB — URINE MICROSCOPIC-ADD ON

## 2012-03-02 LAB — SURGICAL PCR SCREEN
MRSA, PCR: NEGATIVE
Staphylococcus aureus: NEGATIVE

## 2012-03-02 NOTE — Pre-Procedure Instructions (Addendum)
[place]   03/02/2012   Your procedure is scheduled on:  May 24th, Friday   Report to [facility] at  10:30 AM.  Call this number if you have problems the morning of surgery: [phone]   Remember:   Do not eat food:After Midnight Thursday.   May have clear liquids: up to 4 Hours before arrival time  -- 6:30AM.  Clear liquids include soda, tea, black coffee, apple or grape juice, broth.   Take these medicines the morning of surgery with A SIP OF WATER: Valium, Synthroid,              Metoprolol, Protonix, Neurontin   Do not wear jewelry, make-up or nail polish.  Do not wear lotions, powders, or perfumes. You may wear deodorant.  Do not shave 48 hours prior to surgery. Men may shave face and neck.   Do not bring valuables to the hospital.   Contacts, dentures or bridgework may not be worn into surgery.  Leave suitcase in the car. After surgery it may be brought to your room.  For patients admitted to the hospital, checkout time is 11:00 AM the day of discharge.   Patients discharged the day of surgery will not be allowed to drive home.  Name and phone number of your driver: NA  Special Instructions: Incentive Spirometry - Practice and bring it with you on the day of surgery. and CHG Shower Use Special Wash: 1/2 bottle night before surgery and 1/2 bottle morning of surgery.   Please read over the following fact sheets that you were given: Pain Booklet, MRSA Information and Surgical Site Infection Prevention

## 2012-03-03 MED ORDER — CHLORHEXIDINE GLUCONATE 4 % EX LIQD: 60.0000 mL | Freq: Once | CUTANEOUS | Status: DC

## 2012-03-03 MED ORDER — VANCOMYCIN HCL IN DEXTROSE 1-5 GM/200ML-% IV SOLN
1000.0000 mg | INTRAVENOUS | Status: AC
  Administered 2012-03-04: 1000 mg via INTRAVENOUS
  Filled 2012-03-03: qty 200

## 2012-03-03 NOTE — H&P (Signed)
H and P reviewed, jen

## 2012-03-03 NOTE — H&P (Signed)
Becky Hobbs is an 66 y.o. female.   Chief Complaint: low back pain and bilateral leg pain HPI: This patient is seen today a followup of her back.  She has had previous L4 to sacrum fusion for disk herniation and spondylolisthesis L4-5 and degenerative disk disease with disk herniation L5-S1.  Surgery complicated with hematoma discomfort, difficulties with urinary retention.  She resolves these issues, now she reports she is having persistent severe back pain going to leg pain.  It is worse when she stands, tries to ambulate.  Underwent ESI and this was done earlier this year in January.  She says the pain relief was only for about 3 or 4 weeks and then her pain recurred.  Discomfort into her buttock, into her thighs both sides.  In reviewing the patient's MRI scan, she has apparent severe spinal stenosis at L3-4, adjacent segment to her L4 to sacrum fusion.  The study itself shows synovial cyst progressing over the right side at L3-4 and then there is also ligament hypertrophy changes occurring bilaterally with disk bulge versus protrusion causing generalized significant stenosis here.  Lumbar spinal stenosis, synovial cyst both contributing to adjacent level stenosis.  The patient is symptomatic, this is limiting her capacity to stand and ambulate.  Her exam is focally nonfocal, but generally as is the case with significant stenosis clinical exam may not show pain and significant weakness.  It is primarily a problem of standing and walking, neurogenic claudication.  Recommended that she have a decompressive laminectomy at L3-4 with bilateral lateral recess decompression for stenosis.        Past Medical History  Diagnosis Date  . Hypertension   . H/O hiatal hernia     surgery fixed  . Diabetes mellitus     borderline  . Arthritis   . Anxiety     panic attacks  . Asthma   . Hypothyroidism   . Heart murmur     MVP  . Blood transfusion     1973--with twins birth    Past Surgical History    Procedure Date  . Cholecystectomy   . Back surgery   . Tonsillectomy   . Appendectomy   . Abdominal hysterectomy   . Bladder tack   . Breast cyst incision and drainage   . Breast surgery   . Ovarian cyst surgery   . Nasal septum surgery   . Fracture surgery     left wrist  . Nissan fundoplication   . Bunionectomy   Lumbar fusion L4-S1 instrumented  No family history on file. Social History:  does not have a smoking history on file. She does not have any smokeless tobacco history on file. She reports that she drinks alcohol. She reports that she does not use illicit drugs.  Allergies:  Allergies  Allergen Reactions  . Hydrocodone Itching  . Penicillins Hives    No prescriptions prior to admission    Results for orders placed during the hospital encounter of 03/02/12 (from the past 48 hour(s))  URINALYSIS, ROUTINE W REFLEX MICROSCOPIC     Status: Abnormal   Collection Time   03/02/12  1:32 PM      Component Value Range Comment   Color, Urine YELLOW  YELLOW     APPearance CLOUDY (*) CLEAR     Specific Gravity, Urine 1.019  1.005 - 1.030     pH 6.0  5.0 - 8.0     Glucose, UA NEGATIVE  NEGATIVE (mg/dL)    Hgb urine  dipstick NEGATIVE  NEGATIVE     Bilirubin Urine SMALL (*) NEGATIVE     Ketones, ur NEGATIVE  NEGATIVE (mg/dL)    Protein, ur NEGATIVE  NEGATIVE (mg/dL)    Urobilinogen, UA 1.0  0.0 - 1.0 (mg/dL)    Nitrite NEGATIVE  NEGATIVE     Leukocytes, UA SMALL (*) NEGATIVE    SURGICAL PCR SCREEN     Status: Normal   Collection Time   03/02/12  1:32 PM      Component Value Range Comment   MRSA, PCR NEGATIVE  NEGATIVE     Staphylococcus aureus NEGATIVE  NEGATIVE    URINE MICROSCOPIC-ADD ON     Status: Normal   Collection Time   03/02/12  1:32 PM      Component Value Range Comment   Squamous Epithelial / LPF RARE  RARE     WBC, UA 3-6  <3 (WBC/hpf)    RBC / HPF 0-2  <3 (RBC/hpf)    Bacteria, UA RARE  RARE    BASIC METABOLIC PANEL     Status: Abnormal   Collection  Time   03/02/12  1:40 PM      Component Value Range Comment   Sodium 138  135 - 145 (mEq/L)    Potassium 4.3  3.5 - 5.1 (mEq/L)    Chloride 100  96 - 112 (mEq/L)    CO2 27  19 - 32 (mEq/L)    Glucose, Bld 103 (*) 70 - 99 (mg/dL)    BUN 21  6 - 23 (mg/dL)    Creatinine, Ser 1.46 (*) 0.50 - 1.10 (mg/dL)    Calcium 9.5  8.4 - 10.5 (mg/dL)    GFR calc non Af Amer 37 (*) >90 (mL/min)    GFR calc Af Amer 43 (*) >90 (mL/min)   CBC     Status: Abnormal   Collection Time   03/02/12  1:40 PM      Component Value Range Comment   WBC 8.5  4.0 - 10.5 (K/uL)    RBC 4.18  3.87 - 5.11 (MIL/uL)    Hemoglobin 11.6 (*) 12.0 - 15.0 (g/dL)    HCT 36.0  36.0 - 46.0 (%)    MCV 86.1  78.0 - 100.0 (fL)    MCH 27.8  26.0 - 34.0 (pg)    MCHC 32.2  30.0 - 36.0 (g/dL)    RDW 14.5  11.5 - 15.5 (%)    Platelets 244  150 - 400 (K/uL)   DIFFERENTIAL     Status: Abnormal   Collection Time   03/02/12  1:40 PM      Component Value Range Comment   Neutrophils Relative 39 (*) 43 - 77 (%)    Neutro Abs 3.3  1.7 - 7.7 (K/uL)    Lymphocytes Relative 43  12 - 46 (%)    Lymphs Abs 3.6  0.7 - 4.0 (K/uL)    Monocytes Relative 10  3 - 12 (%)    Monocytes Absolute 0.9  0.1 - 1.0 (K/uL)    Eosinophils Relative 8 (*) 0 - 5 (%)    Eosinophils Absolute 0.7  0.0 - 0.7 (K/uL)    Basophils Relative 0  0 - 1 (%)    Basophils Absolute 0.0  0.0 - 0.1 (K/uL)    No results found.  Review of Systems  Constitutional: Negative.   HENT: Negative.   Eyes: Negative.   Respiratory: Negative.   Cardiovascular: Negative.   Gastrointestinal: Negative.   Genitourinary:  Mild stress incontinence of urine.  History of urinary retention post op   Musculoskeletal: Positive for back pain.  Skin: Negative.   Neurological: Negative.   Endo/Heme/Allergies: Negative.   Psychiatric/Behavioral: Negative.     There were no vitals taken for this visit. Physical Exam  Constitutional: She is oriented to person, place, and time. She  appears well-developed and well-nourished.  HENT:  Head: Normocephalic and atraumatic.  Eyes: EOM are normal. Pupils are equal, round, and reactive to light.  Neck: Normal range of motion. Neck supple.  Cardiovascular: Normal rate, regular rhythm and normal heart sounds.   Respiratory: Effort normal and breath sounds normal.  GI: Soft. Bowel sounds are normal.  Musculoskeletal:        sciatic tension tests are negative.  Strength in knee extension, flexion, normal.  Foot dorsiflexion, plantar flexion strength, normal  Neurological: She is alert and oriented to person, place, and time.  Skin: Skin is warm and dry.  Psychiatric: She has a normal mood and affect.     Assessment/Plan Adjacent level spinal stenosis L3-4 above fusion L4-S1  PLAN: L3-4 central decompression  Tiny Chaudhary M 03/03/2012, 10:46 AM

## 2012-03-04 ENCOUNTER — Encounter (HOSPITAL_COMMUNITY): Payer: Self-pay | Admitting: Specialist

## 2012-03-04 ENCOUNTER — Encounter (HOSPITAL_COMMUNITY)
Admission: RE | Disposition: A | Discharge status: Home / Self Care | Payer: Self-pay | Source: Ambulatory Visit | Attending: Specialist

## 2012-03-04 ENCOUNTER — Ambulatory Visit (HOSPITAL_COMMUNITY): Payer: BC Managed Care – PPO

## 2012-03-04 ENCOUNTER — Inpatient Hospital Stay (HOSPITAL_COMMUNITY)
Admission: RE | Admit: 2012-03-04 | Discharge: 2012-03-07 | DRG: 758 | Disposition: A | Discharge status: Home / Self Care | Payer: BC Managed Care – PPO | Source: Ambulatory Visit | Attending: Specialist | Admitting: Specialist

## 2012-03-04 ENCOUNTER — Ambulatory Visit (HOSPITAL_COMMUNITY): Payer: BC Managed Care – PPO | Admitting: Anesthesiology

## 2012-03-04 ENCOUNTER — Encounter (HOSPITAL_COMMUNITY): Payer: Self-pay | Admitting: Anesthesiology

## 2012-03-04 ENCOUNTER — Encounter (HOSPITAL_COMMUNITY): Payer: Self-pay | Admitting: *Deleted

## 2012-03-04 DIAGNOSIS — E119 Type 2 diabetes mellitus without complications: Secondary | ICD-10-CM | POA: Diagnosis present

## 2012-03-04 DIAGNOSIS — M48061 Spinal stenosis, lumbar region without neurogenic claudication: Secondary | ICD-10-CM | POA: Diagnosis present

## 2012-03-04 DIAGNOSIS — Z981 Arthrodesis status: Secondary | ICD-10-CM

## 2012-03-04 DIAGNOSIS — M7130 Other bursal cyst, unspecified site: Principal | ICD-10-CM | POA: Diagnosis present

## 2012-03-04 DIAGNOSIS — I10 Essential (primary) hypertension: Secondary | ICD-10-CM | POA: Diagnosis present

## 2012-03-04 DIAGNOSIS — I059 Rheumatic mitral valve disease, unspecified: Secondary | ICD-10-CM | POA: Diagnosis present

## 2012-03-04 DIAGNOSIS — J45909 Unspecified asthma, uncomplicated: Secondary | ICD-10-CM | POA: Diagnosis present

## 2012-03-04 DIAGNOSIS — Z01812 Encounter for preprocedural laboratory examination: Secondary | ICD-10-CM

## 2012-03-04 DIAGNOSIS — E039 Hypothyroidism, unspecified: Secondary | ICD-10-CM | POA: Diagnosis present

## 2012-03-04 HISTORY — PX: LUMBAR LAMINECTOMY: SHX95

## 2012-03-04 LAB — GLUCOSE, CAPILLARY
Glucose-Capillary: 90 mg/dL (ref 70–99)
Glucose-Capillary: 92 mg/dL (ref 70–99)
Glucose-Capillary: 86 mg/dL (ref 70–99)

## 2012-03-04 SURGERY — MICRODISCECTOMY LUMBAR LAMINECTOMY
Anesthesia: General | Site: Spine Lumbar | Wound class: Clean

## 2012-03-04 MED ORDER — GABAPENTIN 300 MG PO CAPS
300.0000 mg | ORAL_CAPSULE | Freq: Three times a day (TID) | ORAL | Status: DC
  Filled 2012-03-04 (×2): qty 2

## 2012-03-04 MED ORDER — HYDROMORPHONE HCL PF 1 MG/ML IJ SOLN
0.2500 mg | INTRAMUSCULAR | Status: DC | PRN
  Administered 2012-03-04: 0.5 mg via INTRAVENOUS
  Administered 2012-03-04 (×2): 0.25 mg via INTRAVENOUS

## 2012-03-04 MED ORDER — NEOSTIGMINE METHYLSULFATE 1 MG/ML IJ SOLN
INTRAMUSCULAR | Status: DC | PRN
  Administered 2012-03-04: 3 mg via INTRAVENOUS

## 2012-03-04 MED ORDER — DOCUSATE SODIUM 100 MG PO CAPS
100.0000 mg | ORAL_CAPSULE | Freq: Two times a day (BID) | ORAL | Status: DC
  Administered 2012-03-04 – 2012-03-06 (×5): 100 mg via ORAL
  Filled 2012-03-04 (×6): qty 1

## 2012-03-04 MED ORDER — METHOCARBAMOL 500 MG PO TABS
500.0000 mg | ORAL_TABLET | Freq: Four times a day (QID) | ORAL | Status: DC | PRN
  Administered 2012-03-04 – 2012-03-06 (×5): 500 mg via ORAL
  Filled 2012-03-04 (×6): qty 1

## 2012-03-04 MED ORDER — METHOCARBAMOL 100 MG/ML IJ SOLN
500.0000 mg | Freq: Four times a day (QID) | INTRAVENOUS | Status: DC | PRN
  Administered 2012-03-04: 500 mg via INTRAVENOUS
  Filled 2012-03-04: qty 5

## 2012-03-04 MED ORDER — GLYCOPYRROLATE 0.2 MG/ML IJ SOLN
INTRAMUSCULAR | Status: DC | PRN
  Administered 2012-03-04: .6 mg via INTRAVENOUS

## 2012-03-04 MED ORDER — HYDROMORPHONE HCL PF 1 MG/ML IJ SOLN
INTRAMUSCULAR | Status: AC
  Filled 2012-03-04: qty 1

## 2012-03-04 MED ORDER — FENTANYL CITRATE 0.05 MG/ML IJ SOLN
INTRAMUSCULAR | Status: DC | PRN
  Administered 2012-03-04 (×3): 50 ug via INTRAVENOUS
  Administered 2012-03-04: 100 ug via INTRAVENOUS

## 2012-03-04 MED ORDER — ZOLPIDEM TARTRATE 10 MG PO TABS: 10.0000 mg | ORAL_TABLET | Freq: Every evening | ORAL | Status: DC | PRN

## 2012-03-04 MED ORDER — EPHEDRINE SULFATE 50 MG/ML IJ SOLN
INTRAMUSCULAR | Status: DC | PRN
  Administered 2012-03-04: 7.5 mg via INTRAVENOUS

## 2012-03-04 MED ORDER — ACETAMINOPHEN 650 MG RE SUPP: 650.0000 mg | RECTAL | Status: DC | PRN

## 2012-03-04 MED ORDER — ROCURONIUM BROMIDE 100 MG/10ML IV SOLN
INTRAVENOUS | Status: DC | PRN
  Administered 2012-03-04: 50 mg via INTRAVENOUS

## 2012-03-04 MED ORDER — SODIUM CHLORIDE 0.9 % IJ SOLN: 3.0000 mL | INTRAMUSCULAR | Status: DC | PRN

## 2012-03-04 MED ORDER — ONDANSETRON HCL 4 MG/2ML IJ SOLN: 4.0000 mg | Freq: Once | INTRAMUSCULAR | Status: DC | PRN

## 2012-03-04 MED ORDER — LIDOCAINE HCL (CARDIAC) 20 MG/ML IV SOLN
INTRAVENOUS | Status: DC | PRN
  Administered 2012-03-04: 100 mg via INTRAVENOUS

## 2012-03-04 MED ORDER — INSULIN ASPART 100 UNIT/ML ~~LOC~~ SOLN: 0.0000 [IU] | Freq: Three times a day (TID) | SUBCUTANEOUS | Status: DC

## 2012-03-04 MED ORDER — KETOROLAC TROMETHAMINE 30 MG/ML IJ SOLN
30.0000 mg | Freq: Once | INTRAMUSCULAR | Status: AC
  Administered 2012-03-04: 30 mg via INTRAVENOUS

## 2012-03-04 MED ORDER — BISACODYL 10 MG RE SUPP: 10.0000 mg | Freq: Every day | RECTAL | Status: DC | PRN

## 2012-03-04 MED ORDER — THROMBIN 5000 UNITS EX SOLR
OROMUCOSAL | Status: DC | PRN
  Administered 2012-03-04: 14:00:00 via TOPICAL

## 2012-03-04 MED ORDER — ONDANSETRON HCL 4 MG/2ML IJ SOLN: 4.0000 mg | INTRAMUSCULAR | Status: DC | PRN

## 2012-03-04 MED ORDER — METOPROLOL SUCCINATE ER 100 MG PO TB24
100.0000 mg | ORAL_TABLET | Freq: Every day | ORAL | Status: DC
  Administered 2012-03-05 – 2012-03-07 (×3): 100 mg via ORAL
  Filled 2012-03-04 (×4): qty 1

## 2012-03-04 MED ORDER — 0.9 % SODIUM CHLORIDE (POUR BTL) OPTIME
TOPICAL | Status: DC | PRN
  Administered 2012-03-04: 1000 mL

## 2012-03-04 MED ORDER — TRAMADOL HCL 50 MG PO TABS
100.0000 mg | ORAL_TABLET | Freq: Four times a day (QID) | ORAL | Status: DC
  Administered 2012-03-04 – 2012-03-07 (×11): 100 mg via ORAL
  Filled 2012-03-04 (×11): qty 2

## 2012-03-04 MED ORDER — DIAZEPAM 5 MG PO TABS
5.0000 mg | ORAL_TABLET | Freq: Four times a day (QID) | ORAL | Status: DC | PRN
  Administered 2012-03-05: 5 mg via ORAL
  Filled 2012-03-04: qty 1

## 2012-03-04 MED ORDER — BUPIVACAINE-EPINEPHRINE PF 0.5-1:200000 % IJ SOLN
INTRAMUSCULAR | Status: DC | PRN
  Administered 2012-03-04: 10 mL

## 2012-03-04 MED ORDER — LACTATED RINGERS IV SOLN
INTRAVENOUS | Status: DC | PRN
  Administered 2012-03-04 (×2): via INTRAVENOUS

## 2012-03-04 MED ORDER — ALLOPURINOL 100 MG PO TABS
100.0000 mg | ORAL_TABLET | Freq: Every day | ORAL | Status: DC
  Administered 2012-03-05 – 2012-03-07 (×3): 100 mg via ORAL
  Filled 2012-03-04 (×4): qty 1

## 2012-03-04 MED ORDER — SODIUM CHLORIDE 0.9 % IJ SOLN
3.0000 mL | Freq: Two times a day (BID) | INTRAMUSCULAR | Status: DC
  Administered 2012-03-05 – 2012-03-06 (×3): 3 mL via INTRAVENOUS

## 2012-03-04 MED ORDER — METFORMIN HCL 500 MG PO TABS
500.0000 mg | ORAL_TABLET | Freq: Two times a day (BID) | ORAL | Status: DC
  Administered 2012-03-05 – 2012-03-06 (×3): 500 mg via ORAL
  Filled 2012-03-04 (×7): qty 1

## 2012-03-04 MED ORDER — OXYCODONE-ACETAMINOPHEN 5-325 MG PO TABS
1.0000 | ORAL_TABLET | ORAL | Status: DC | PRN
  Administered 2012-03-05 – 2012-03-07 (×8): 2 via ORAL
  Filled 2012-03-04 (×9): qty 2

## 2012-03-04 MED ORDER — GABAPENTIN 300 MG PO CAPS
300.0000 mg | ORAL_CAPSULE | Freq: Two times a day (BID) | ORAL | Status: DC
  Administered 2012-03-05 – 2012-03-07 (×6): 300 mg via ORAL
  Filled 2012-03-04 (×8): qty 1

## 2012-03-04 MED ORDER — ACETAMINOPHEN 325 MG PO TABS: 650.0000 mg | ORAL_TABLET | ORAL | Status: DC | PRN

## 2012-03-04 MED ORDER — CEFAZOLIN SODIUM 1-5 GM-% IV SOLN
1.0000 g | Freq: Three times a day (TID) | INTRAVENOUS | Status: AC
  Administered 2012-03-04 – 2012-03-05 (×2): 1 g via INTRAVENOUS
  Filled 2012-03-04 (×4): qty 50

## 2012-03-04 MED ORDER — SODIUM CHLORIDE 0.9 % IV SOLN: 250.0000 mL | INTRAVENOUS | Status: DC

## 2012-03-04 MED ORDER — LEVOTHYROXINE SODIUM 75 MCG PO TABS
75.0000 ug | ORAL_TABLET | Freq: Every day | ORAL | Status: DC
  Administered 2012-03-05 – 2012-03-07 (×3): 75 ug via ORAL
  Filled 2012-03-04 (×5): qty 1

## 2012-03-04 MED ORDER — LACTATED RINGERS IV SOLN
INTRAVENOUS | Status: DC
  Administered 2012-03-04: 12:00:00 via INTRAVENOUS

## 2012-03-04 MED ORDER — VITAMIN D 1000 UNITS PO TABS
4000.0000 [IU] | ORAL_TABLET | Freq: Every day | ORAL | Status: DC
  Administered 2012-03-05 – 2012-03-06 (×2): 4000 [IU] via ORAL
  Filled 2012-03-04 (×4): qty 4

## 2012-03-04 MED ORDER — SENNOSIDES-DOCUSATE SODIUM 8.6-50 MG PO TABS
1.0000 | ORAL_TABLET | Freq: Every evening | ORAL | Status: DC | PRN
  Administered 2012-03-04: 1 via ORAL
  Filled 2012-03-04: qty 1

## 2012-03-04 MED ORDER — PANTOPRAZOLE SODIUM 40 MG IV SOLR
40.0000 mg | Freq: Every day | INTRAVENOUS | Status: DC
  Filled 2012-03-04 (×2): qty 40

## 2012-03-04 MED ORDER — PANTOPRAZOLE SODIUM 40 MG PO TBEC
40.0000 mg | DELAYED_RELEASE_TABLET | Freq: Every day | ORAL | Status: DC
  Administered 2012-03-04 – 2012-03-06 (×3): 40 mg via ORAL
  Filled 2012-03-04 (×4): qty 1

## 2012-03-04 MED ORDER — LORATADINE 10 MG PO TABS
10.0000 mg | ORAL_TABLET | Freq: Every day | ORAL | Status: DC
  Administered 2012-03-05 – 2012-03-07 (×3): 10 mg via ORAL
  Filled 2012-03-04 (×4): qty 1

## 2012-03-04 MED ORDER — PROPOFOL 10 MG/ML IV EMUL
INTRAVENOUS | Status: DC | PRN
  Administered 2012-03-04: 200 mg via INTRAVENOUS

## 2012-03-04 MED ORDER — OLOPATADINE HCL 0.1 % OP SOLN
1.0000 [drp] | Freq: Two times a day (BID) | OPHTHALMIC | Status: DC | PRN
  Administered 2012-03-05: 1 [drp] via OPHTHALMIC
  Filled 2012-03-04: qty 5

## 2012-03-04 MED ORDER — PHENOL 1.4 % MT LIQD: 1.0000 | OROMUCOSAL | Status: DC | PRN

## 2012-03-04 MED ORDER — TORSEMIDE 20 MG PO TABS
20.0000 mg | ORAL_TABLET | Freq: Every day | ORAL | Status: DC | PRN
  Filled 2012-03-04: qty 1

## 2012-03-04 MED ORDER — ESTROGENS CONJ SYNTHETIC A 0.3 MG PO TABS
0.3000 mg | ORAL_TABLET | Freq: Every day | ORAL | Status: DC
  Administered 2012-03-05 – 2012-03-06 (×2): 0.3 mg via ORAL
  Filled 2012-03-04 (×4): qty 1

## 2012-03-04 MED ORDER — MENTHOL 3 MG MT LOZG
1.0000 | LOZENGE | OROMUCOSAL | Status: DC | PRN
  Administered 2012-03-06: 3 mg via ORAL
  Filled 2012-03-04 (×3): qty 9

## 2012-03-04 MED ORDER — POTASSIUM CHLORIDE IN NACL 20-0.9 MEQ/L-% IV SOLN
INTRAVENOUS | Status: DC
  Administered 2012-03-04: 18:00:00 via INTRAVENOUS
  Filled 2012-03-04 (×7): qty 1000

## 2012-03-04 MED ORDER — MIDAZOLAM HCL 5 MG/5ML IJ SOLN
INTRAMUSCULAR | Status: DC | PRN
  Administered 2012-03-04: 2 mg via INTRAVENOUS

## 2012-03-04 MED ORDER — ONDANSETRON HCL 4 MG/2ML IJ SOLN
INTRAMUSCULAR | Status: DC | PRN
  Administered 2012-03-04: 4 mg via INTRAVENOUS

## 2012-03-04 MED ORDER — POTASSIUM CHLORIDE CRYS ER 20 MEQ PO TBCR
20.0000 meq | EXTENDED_RELEASE_TABLET | Freq: Every day | ORAL | Status: DC
  Administered 2012-03-04 – 2012-03-07 (×4): 20 meq via ORAL
  Filled 2012-03-04 (×5): qty 1

## 2012-03-04 MED ORDER — HYDROMORPHONE HCL PF 1 MG/ML IJ SOLN
0.5000 mg | INTRAMUSCULAR | Status: DC | PRN
  Administered 2012-03-05 (×2): 1 mg via INTRAVENOUS
  Filled 2012-03-04 (×3): qty 1

## 2012-03-04 MED ORDER — GABAPENTIN 300 MG PO CAPS
600.0000 mg | ORAL_CAPSULE | Freq: Every day | ORAL | Status: DC
  Administered 2012-03-04 – 2012-03-06 (×3): 600 mg via ORAL
  Filled 2012-03-04 (×4): qty 2

## 2012-03-04 SURGICAL SUPPLY — 56 items
ADH SKN CLS APL DERMABOND .7 (GAUZE/BANDAGES/DRESSINGS) ×1
BUR RND FLUTED 2.5 (BURR) ×1 IMPLANT
BUR ROUND FLUTED 4 SOFT TCH (BURR) IMPLANT
BUR SABER RD CUTTING 3.0 (BURR) ×2 IMPLANT
CANISTER SUCTION 2500CC (MISCELLANEOUS) ×2 IMPLANT
CLOTH BEACON ORANGE TIMEOUT ST (SAFETY) ×2 IMPLANT
CORDS BIPOLAR (ELECTRODE) ×2 IMPLANT
COVER SURGICAL LIGHT HANDLE (MISCELLANEOUS) ×2 IMPLANT
DERMABOND ADVANCED (GAUZE/BANDAGES/DRESSINGS) ×1
DERMABOND ADVANCED .7 DNX12 (GAUZE/BANDAGES/DRESSINGS) ×1 IMPLANT
DRAPE C-ARM 42X72 X-RAY (DRAPES) ×2 IMPLANT
DRAPE MICROSCOPE LEICA (MISCELLANEOUS) ×2 IMPLANT
DRAPE POUCH INSTRU U-SHP 10X18 (DRAPES) ×2 IMPLANT
DRAPE PROXIMA HALF (DRAPES) ×1 IMPLANT
DRAPE SURG 17X23 STRL (DRAPES) ×8 IMPLANT
DRSG MEPILEX BORDER 4X4 (GAUZE/BANDAGES/DRESSINGS) ×1 IMPLANT
DRSG MEPILEX BORDER 4X8 (GAUZE/BANDAGES/DRESSINGS) IMPLANT
DURAPREP 26ML APPLICATOR (WOUND CARE) ×2 IMPLANT
ELECT BLADE 4.0 EZ CLEAN MEGAD (MISCELLANEOUS) ×2
ELECT CAUTERY BLADE 6.4 (BLADE) ×2 IMPLANT
ELECT REM PT RETURN 9FT ADLT (ELECTROSURGICAL) ×2
ELECTRODE BLDE 4.0 EZ CLN MEGD (MISCELLANEOUS) ×1 IMPLANT
ELECTRODE REM PT RTRN 9FT ADLT (ELECTROSURGICAL) ×1 IMPLANT
GLOVE BIOGEL PI IND STRL 7.5 (GLOVE) ×1 IMPLANT
GLOVE BIOGEL PI INDICATOR 7.5 (GLOVE) ×1
GLOVE ECLIPSE 7.0 STRL STRAW (GLOVE) ×2 IMPLANT
GLOVE ECLIPSE 8.5 STRL (GLOVE) ×2 IMPLANT
GLOVE SURG 8.5 LATEX PF (GLOVE) ×2 IMPLANT
GOWN PREVENTION PLUS LG XLONG (DISPOSABLE) IMPLANT
GOWN PREVENTION PLUS XXLARGE (GOWN DISPOSABLE) ×1 IMPLANT
GOWN STRL NON-REIN LRG LVL3 (GOWN DISPOSABLE) ×4 IMPLANT
KIT BASIN OR (CUSTOM PROCEDURE TRAY) ×2 IMPLANT
KIT ROOM TURNOVER OR (KITS) ×2 IMPLANT
NDL SPNL 18GX3.5 QUINCKE PK (NEEDLE) ×2 IMPLANT
NEEDLE 22X1 1/2 (OR ONLY) (NEEDLE) ×2 IMPLANT
NEEDLE SPNL 18GX3.5 QUINCKE PK (NEEDLE) ×4 IMPLANT
NS IRRIG 1000ML POUR BTL (IV SOLUTION) ×2 IMPLANT
PACK LAMINECTOMY ORTHO (CUSTOM PROCEDURE TRAY) ×2 IMPLANT
PAD ARMBOARD 7.5X6 YLW CONV (MISCELLANEOUS) ×4 IMPLANT
PATTIES SURGICAL .5 X.5 (GAUZE/BANDAGES/DRESSINGS) IMPLANT
PATTIES SURGICAL .75X.75 (GAUZE/BANDAGES/DRESSINGS) IMPLANT
SPONGE LAP 4X18 X RAY DECT (DISPOSABLE) ×2 IMPLANT
SPONGE SURGIFOAM ABS GEL 100 (HEMOSTASIS) IMPLANT
SUT VIC AB 0 CT1 27 (SUTURE) ×2
SUT VIC AB 0 CT1 27XBRD ANBCTR (SUTURE) IMPLANT
SUT VIC AB 1 CT1 27 (SUTURE) ×2
SUT VIC AB 1 CT1 27XBRD ANBCTR (SUTURE) IMPLANT
SUT VIC AB 2-0 CT1 27 (SUTURE) ×2
SUT VIC AB 2-0 CT1 TAPERPNT 27 (SUTURE) IMPLANT
SUT VICRYL 0 UR6 27IN ABS (SUTURE) IMPLANT
SUT VICRYL 4-0 PS2 18IN ABS (SUTURE) ×1 IMPLANT
SYR CONTROL 10ML LL (SYRINGE) ×2 IMPLANT
TOWEL OR 17X24 6PK STRL BLUE (TOWEL DISPOSABLE) ×1 IMPLANT
TOWEL OR 17X26 10 PK STRL BLUE (TOWEL DISPOSABLE) ×2 IMPLANT
TRAY FOLEY CATH 14FR (SET/KITS/TRAYS/PACK) IMPLANT
WATER STERILE IRR 1000ML POUR (IV SOLUTION) ×1 IMPLANT

## 2012-03-04 NOTE — Preoperative (Signed)
Beta Blockers   Reason not to administer Beta Blockers:Not Applicable 

## 2012-03-04 NOTE — Brief Op Note (Signed)
03/04/2012  4:03 PM  PATIENT:  Becky Hobbs  66 y.o. 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  PROCEDURE:  Procedure(s) (LRB):CENTRAL LUMBAR LAMINECTOMY L3-4, excison of synovial cyst right L3-4.  SURGEON:  Surgeon(s) and Role:    * Jessy Oto, MD - Primary  PHYSICIAN ASSISTANT: Phillips Hay PA-C  ANESTHESIA:   general, Dr. Al Corpus.  EBL:  Total I/O In: 1500 [I.V.:1500] Out: 50 [Blood:50]  BLOOD ADMINISTERED:none  DRAINS: none   LOCAL MEDICATIONS USED:  MARCAINE    and Amount: 10 ml  SPECIMEN:  No Specimen  DISPOSITION OF SPECIMEN:  N/A  COUNTS:  YES  TOURNIQUET:  * No tourniquets in log *  DICTATION: .Dragon Dictation  PLAN OF CARE: Admit to inpatient   PATIENT DISPOSITION:  PACU - hemodynamically stable.   Delay start of Pharmacological VTE agent (>24hrs) due to surgical blood loss or risk of bleeding: yes

## 2012-03-04 NOTE — Anesthesia Preprocedure Evaluation (Addendum)
Anesthesia Evaluation  Patient identified by MRN, date of birth, ID band Patient awake    Reviewed: Allergy & Precautions, H&P , NPO status , Patient's Chart, lab work & pertinent test results, reviewed documented beta blocker date and time   Airway Mallampati: I TM Distance: >3 FB Neck ROM: Full    Dental  (+) Teeth Intact and Dental Advisory Given   Pulmonary asthma ,  breath sounds clear to auscultation        Cardiovascular hypertension, Pt. on home beta blockers and Pt. on medications + Valvular Problems/Murmurs MVP Rhythm:Regular Rate:Normal     Neuro/Psych    GI/Hepatic hiatal hernia,   Endo/Other  Diabetes mellitus-, Well Controlled, Type 2Hypothyroidism   Renal/GU      Musculoskeletal   Abdominal   Peds  Hematology   Anesthesia Other Findings   Reproductive/Obstetrics                          Anesthesia Physical Anesthesia Plan  ASA: III  Anesthesia Plan: General   Post-op Pain Management:    Induction: Intravenous  Airway Management Planned: Oral ETT  Additional Equipment:   Intra-op Plan:   Post-operative Plan: Extubation in OR  Informed Consent: I have reviewed the patients History and Physical, chart, labs and discussed the procedure including the risks, benefits and alternatives for the proposed anesthesia with the patient or authorized representative who has indicated his/her understanding and acceptance.   Dental advisory given  Plan Discussed with: CRNA, Anesthesiologist and Surgeon  Anesthesia Plan Comments:         Anesthesia Quick Evaluation

## 2012-03-04 NOTE — H&P (Signed)
Patient was seen and examined in the preop holding area. There has been no interval  Change in this patient's exam preop  history and physical exam  Lab tests and images have been examined and reviewed.  The Risks benefits and alternative treatments have been discussed  extensively,questions answered.  The patient has elected to undergo the discussed surgical treatment. 

## 2012-03-04 NOTE — Progress Notes (Signed)
Pt admitted to room 5023 a/o , VSS neuro intact

## 2012-03-04 NOTE — Progress Notes (Signed)
Report to [employer] as caregiver

## 2012-03-04 NOTE — Op Note (Signed)
03/04/2012  4:13 PM  PATIENT:  Becky Hobbs  66 y.o. 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 Lorrene Reid.    COMPLICATIONS:  None.     PROCEDURE: The patient was met in the holding area, and the appropriate right at L3-4 identified and marked with an "X" and my initials. The patient was then transported to OR and was placed under general anesthetic and intubated. The patient was then placed under  general anesthesia without difficulty. Patient was then transferred to the operating room table Wilson frame sliding OR table. The patient received appropriate preoperative antibiotic prophylaxis vancomycin for a penicillin allergy. No Foley catheter.   Time-out procedure was called and correct  . The L3 spinous process was easily palpable in the midline. Standard prep was carried out using DuraPrep solution from the lower dorsal spine to the upper sacral segment. Draped in the usual manner iodine Vi-Drape was used. The old incision scar was ellipsed at the L3 spinous process as well as palpable spinous and incision made approximately 2-1/2 inches in length and this was carried through the skin and subcutaneous layers down to the spinous process of residual L3. Incision was then made on either side of his lower spinous process and carried superiorly to the spinous process of L2. Clamp was placed on the spinous process of L3 at its upper level. Intraoperative lateral radiograph demonstrated the clamp on the upper aspect  of the spinous process of L3 and this was marked using a surgical marking pencil. The posterior elements were then carefully exposed using Cobb elevator and electrocautery over the posterior aspect of the canal 3 and L2 lamina and Cobb then used to remove attachment paralumbar muscles here. Boss McCollough retractor was then inserted and providing exposure at the level of the L3 spinous process and the lower half of L2. Loupe magnification headlamp were used and a carefully then the spinous process of L3 was excised as well as the inferior 30% of the spinous process of L2 this was carried down to the base of the spinous process of L3 on the lamina centrally thinned using a Leksell rongeur. High-speed bur was then used to carefully thin the central portions of the lamina of L3 from its most inferior extent to the interlaminar space at the L2-3 level. 3 mm Kerrison then used to resect the thinned area of central lamina. Operating room microscope was sterilely draped brought into the field and the operating room microscope the very superior aspect of the lamina of L3 was able to be resected first freeing the the thecal sac and off of the ventral aspect of the superior neural arch using a Penfield 4 as well as a R235 hockey-stick nerve probe. 3 mm Kerrison was then used to resect the superior neural arch to the L2-3 interlaminar area and cleared for a Flavum was resected over both lateral recesses. Osteotomes were then used to resect bone along the lateral aspects of the central laminectomy at L3 and then 3 mm Kerrison was  used to further undercut the lamina and the medial aspect of the facets the L2-3 level primarily over the left side first removing reflected portion ligamentum flavum from superior to inferior while carefully protecting the thecal sac and the L4 nerve root and L3 nerve roots. An abundant amount of ligamentum flavum found to be present pressing on the L4 nerve roots after entry point to the  neuroforamen for the forward and along the lateral recess here. Decompression was carried out laterally until the 4 root was noted to be exiting without further nerve compression secondary to the ligamentum flavum and lateral recess narrowing. Additionally the L3 nerve root was decompressed in its foramen on the left side. Bone wax was applied to bleeding cancellus bone surfaces and carefully I nerve probe was it'll be passed out the L2 neuroforamen above. Changing sides the OR to bone then attention was turned to the right L2-3 level where synovial cyst was found to be present in addition to hypertrophic ligamentum flavum changes of the osteotome was used to resect bone along the medial aspect of the L3-4 facet approximately 10-15% 3 mm Kerrison used to resect the and detached the portion ligamentum flavum on the aspect of the right L3-4 facet this was then carried to the L4 neuroforamen were some residual superior articular process and its turned into the superior neural arch of L 4 was was present and appeared to be causing compression of the L4 nerve root at its entry point into the L4 neuroforamen this was resected using 3 mm Kerrison ligamentum flavum was then carefully grasped using a micro-pituitary and placed under tension while a Penfield 4 is used to carefully free up ligamentum flavum and synovial cyst off of the right side of the thecal side and a significant impression on this side was present secondary to synovial cyst then the cyst was entered and had removal of granular fronds of tissue within cyst that represent a synovial tissue. Additionally the cyst was then carefully excised off of the dura along the right side and over the central aspect of the thecal sac at the L3-4 level. This was resected to the area of scar tissue at upper end of the patient's previous L4 fusion site. Reflected from was resected and the neuroforamen for the 4 root on the right side decompressed well as was the neuroforamen  for L3 some amount of scar tissue remained along the superior aspect of the laminotomy area this was left intact. The nerve probe could be passed out the L2 neuroforamen on the right side as well. Completed the central laminectomy and bone wax was applied to bleeding cancellus bone surfaces above size excess bone was removed irrigation was carried out using copious amounts of irrigant solution. Small amount of thrombin-soaked Gelfoam Gelfoam was placed centrally over the posterior aspect of the thecal sac and the or lumbar muscles were then reapproximated in the midline with interrupted #1 Vicryl suture. Lumbodorsal fascia approximated the midline with interrupted #1 Vicryl suture subcutaneous layers approximated deep with interrupted 0 Vicryl sutures more superficial layers with interrupted 2-0 Vicryl sutures and the skin closed with a running subcutaneous stitch of 4-0 Vicryl. Dermabond was then applied to the incision site and then MedPlex bandage. Patient was then able to be returned to a supine position on her stretcher then reactivated extubated and returned to recovery room in satisfactory condition all instrument and sponge counts were correct.       Haileigh Pitz E  03/04/2012, 4:13 PM

## 2012-03-04 NOTE — Transfer of Care (Signed)
Immediate Anesthesia Transfer of Care Note  Patient: Becky Hobbs  Procedure(s) Performed: Procedure(s) (LRB): MICRODISCECTOMY LUMBAR LAMINECTOMY (N/A)  Patient Location: PACU  Anesthesia Type: General  Level of Consciousness: awake, oriented and sedated  Airway & Oxygen Therapy: Patient Spontanous Breathing and Patient connected to nasal cannula oxygen  Post-op Assessment: Report given to PACU RN, Post -op Vital signs reviewed and stable and Patient moving all extremities  Post vital signs: Reviewed  Complications: No apparent anesthesia complications

## 2012-03-04 NOTE — Anesthesia Postprocedure Evaluation (Signed)
  Anesthesia Post-op Note  Patient: Becky Hobbs  Procedure(s) Performed: Procedure(s) (LRB): MICRODISCECTOMY LUMBAR LAMINECTOMY (N/A)  Patient Location: PACU  Anesthesia Type: General  Level of Consciousness: awake, alert  and oriented  Airway and Oxygen Therapy: Patient Spontanous Breathing and Patient connected to nasal cannula oxygen  Post-op Pain: mild  Post-op Assessment: Post-op Vital signs reviewed  Post-op Vital Signs: Reviewed  Complications: No apparent anesthesia complications

## 2012-03-05 LAB — GLUCOSE, CAPILLARY
Glucose-Capillary: 78 mg/dL (ref 70–99)
Glucose-Capillary: 92 mg/dL (ref 70–99)
Glucose-Capillary: 121 mg/dL — ABNORMAL HIGH (ref 70–99)

## 2012-03-05 MED ORDER — PANTOPRAZOLE SODIUM 40 MG PO TBEC
40.0000 mg | DELAYED_RELEASE_TABLET | Freq: Once | ORAL | Status: AC
  Administered 2012-03-06: 40 mg via ORAL

## 2012-03-05 NOTE — Evaluation (Signed)
Occupational Therapy Evaluation Patient Details Name: Becky Hobbs MRN: [id number] DOB: 12/06/1945 Today's Date: 03/05/2012 Time: [id number] OT Time Calculation (min): 24 min  OT Assessment / Plan / Recommendation Clinical Impression  This 66 year old female was admitted for lumbar microdiskectomy.  She has had previous back surgeries and has AE/DME.  Reviewed back precautions with ADLs during this evaluation, and pt verbalizes understanding.  No further OT needs are identified.    OT Assessment       Follow Up Recommendations  No OT follow up    Barriers to Discharge      Equipment Recommendations  None recommended by OT;None recommended by PT    Recommendations for Other Services    Frequency       Precautions / Restrictions Precautions Precautions: Back Restrictions Weight Bearing Restrictions: No       ADL   Grooming: Performed;Teeth care;Supervision/safety Where Assessed - Grooming: Supported standing Toilet Transfer: Performed;Min Conservator, museum/gallery: Grab bars;Raised toilet seat with arms (or 3-in-1 over toilet) Toileting - Clothing Manipulation and Hygiene: Performed;Min guard Where Assessed - Toileting Clothing Manipulation and Hygiene: Standing ADL Comments: reviewed education re:  adls and back precautions.  Pt unable to cross legs at this time due to pain.  She has AE kit, 3:1, and shower chair.  She will have help at home    OT Diagnosis:    OT Problem List:   OT Treatment Interventions:     OT Goals    Visit Information  Last OT Received On: 03/05/12 Assistance Needed: +1    Subjective Data  Subjective: I've been through this before but you can go over it with me again Patient Stated Goal: Be independent   Prior Functioning  Home Living Bathroom Shower/Tub: Walk-in shower;Tub/shower unit Bathroom Toilet: Standard Home Adaptive Equipment: Walker - rolling;Wheelchair - manual;Bedside commode/3-in-1 Communication Communication: No  difficulties Dominant Hand: Right    Cognition  Overall Cognitive Status: Appears within functional limits for tasks assessed/performed Behavior During Session: [facility] for tasks performed    Extremity/Trunk Assessment Right Upper Extremity Assessment RUE ROM/Strength/Tone: [facility] for tasks assessed (tested to 90 for back precautions) Left Upper Extremity Assessment LUE ROM/Strength/Tone: WFL for tasks assessed   Mobility Transfers Sit to Stand: 4: Min guard   Exercise    Balance    End of Session OT - End of Session Activity Tolerance: Patient tolerated treatment well Patient left: in chair;with call bell/phone within reach   [facility] 03/05/2012, 2:32 PM Lesle Chris, OTR/L [phone] 03/05/2012

## 2012-03-05 NOTE — Progress Notes (Signed)
Patient ID: Becky Hobbs, female   DOB: Feb 05, 1946, 66 y.o.   MRN: [id number] Patient is postoperative day 1 for lumbar spine surgery. Patient's both lower extremities are neurovascularly intact. She is comfortable. Plan for progressive ambulation with therapy today and discharged to home once stable with therapy.

## 2012-03-05 NOTE — Progress Notes (Signed)
Physical Therapy Evaluation Patient Details Name: Becky Hobbs MRN: [id number] DOB: Nov 28, 1945 Today's Date: 03/05/2012 Time: [id number] PT Time Calculation (min): 29 min  PT Assessment / Plan / Recommendation Clinical Impression  Pt is a 66 y/o female admitted s/p lumbar microdiscetomy along with the below PT problem list.  Pt would benefit from acute PT to maximize independence and facilitate d/c home without any follow-up PT needs at d/c.    PT Assessment  Patient needs continued PT services    Follow Up Recommendations  No PT follow up    Barriers to Discharge None      lEquipment Recommendations  None recommended by PT    Recommendations for Other Services     Frequency Min 5X/week    Precautions / Restrictions Precautions Precautions: Back Precaution Booklet Issued: Yes (comment) Precaution Comments: Educated on 3/3 back precautions.  Restrictions Weight Bearing Restrictions: No   Pertinent Vitals/Pain 8/10 in back.  Pt repositioned and RN made aware.      Mobility  Bed Mobility Bed Mobility: Rolling Left;Left Sidelying to Sit Rolling Left: 4: Min assist Left Sidelying to Sit: 4: Min assist Details for Bed Mobility Assistance: Assist for balance and due to pain with cues for safest sequence following log rolling. Transfers Transfers: Sit to Stand;Stand to Sit Sit to Stand: 4: Min assist;With upper extremity assist;From bed Stand to Sit: 4: Min assist;With upper extremity assist Details for Transfer Assistance: Assist to translate trunk anterior and slow descent to surface with cues for safest hand placement. Ambulation/Gait Ambulation/Gait Assistance: 4: Min assist Ambulation Distance (Feet): 50 Feet Assistive device: Rolling walker Ambulation/Gait Assistance Details: Assist for balance with cues for tall posture and safe sequence inside RW. Gait Pattern: Decreased stride length;Narrow base of support;Trunk flexed Stairs: No Wheelchair Mobility Wheelchair  Mobility: No    Exercises     PT Diagnosis: Difficulty walking;Acute pain  PT Problem List: Decreased activity tolerance;Decreased balance;Decreased mobility;Decreased knowledge of use of DME;Decreased knowledge of precautions;Pain PT Treatment Interventions: DME instruction;Gait training;Stair training;Functional mobility training;Therapeutic activities;Balance training;Patient/family education   PT Goals Acute Rehab PT Goals PT Goal Formulation: With patient Time For Goal Achievement: 03/12/12 Potential to Achieve Goals: Good Pt will Roll Supine to Right Side: with modified independence PT Goal: Rolling Supine to Right Side - Progress: Goal set today Pt will Roll Supine to Left Side: with modified independence PT Goal: Rolling Supine to Left Side - Progress: Goal set today Pt will go Supine/Side to Sit: with modified independence PT Goal: Supine/Side to Sit - Progress: Goal set today Pt will go Sit to Supine/Side: with modified independence PT Goal: Sit to Supine/Side - Progress: Goal set today Pt will go Sit to Stand: with modified independence PT Goal: Sit to Stand - Progress: Goal set today Pt will go Stand to Sit: with modified independence PT Goal: Stand to Sit - Progress: Goal set today Pt will Ambulate: >150 feet;with modified independence;with least restrictive assistive device PT Goal: Ambulate - Progress: Goal set today Pt will Go Up / Down Stairs: 3-5 stairs;with modified independence;with least restrictive assistive device PT Goal: Up/Down Stairs - Progress: Goal set today  Visit Information  Last PT Received On: 03/05/12 Assistance Needed: +1    Subjective Data  Subjective: "I can't pee." Patient Stated Goal: "Go home."   Prior Functioning  Home Living Lives With: Alone Available Help at Discharge: Family;Friend(s) Type of Home: House Home Access: Stairs to enter [employer] of Steps: 3 Entrance Stairs-Rails: Right Home Layout: Two  level Alternate Level Stairs-Number of Steps: 12 Alternate Level Stairs-Rails: Left Bathroom Shower/Tub: Tub/shower unit;Curtain Bathroom Toilet: Standard Bathroom Accessibility: Yes Home Adaptive Equipment: Walker - rolling;Wheelchair - manual;Bedside commode/3-in-1 Prior Function Level of Independence: Independent Able to Take Stairs?: Yes Driving: Yes Vocation: Retired Corporate investment banker: No difficulties Dominant Hand: Right    Cognition  Overall Cognitive Status: Appears within functional limits for tasks assessed/performed Arousal/Alertness: Awake/alert Orientation Level: Appears intact for tasks assessed Behavior During Session: [facility] for tasks performed    Extremity/Trunk Assessment Right Upper Extremity Assessment RUE ROM/Strength/Tone: Within functional levels RUE Sensation: WFL - Light Touch RUE Coordination: WFL - gross/fine motor Left Upper Extremity Assessment LUE ROM/Strength/Tone: Within functional levels LUE Sensation: WFL - Light Touch LUE Coordination: WFL - gross/fine motor Right Lower Extremity Assessment RLE ROM/Strength/Tone: Within functional levels RLE Sensation: WFL - Light Touch RLE Coordination: WFL - gross/fine motor Left Lower Extremity Assessment LLE ROM/Strength/Tone: Within functional levels LLE Sensation: WFL - Light Touch LLE Coordination: WFL - gross/fine motor Trunk Assessment Trunk Assessment: Normal   Balance Balance Balance Assessed: No  End of Session PT - End of Session Equipment Utilized During Treatment: Gait belt Activity Tolerance: Patient tolerated treatment well;Patient limited by pain Patient left: in chair;with call bell/phone within reach Nurse Communication: Mobility status;Patient requests pain meds   Cyndia Bent 03/05/2012, 11:33 AM  03/05/2012 Cyndia Bent, PT, DPT [phone]

## 2012-03-05 NOTE — Progress Notes (Signed)
Patient continues to have urinary retention.  She was able to void 150cc in 12 hrs.  Bladder scan was performed patient had 551mL.  Patient was straight cathed for 500 mL.  Patient was instructed if unable to void by 2100 she would be bladder scanned again and potentially straight cathed.  Becky Hobbs N

## 2012-03-06 ENCOUNTER — Encounter (HOSPITAL_COMMUNITY): Payer: Self-pay | Admitting: *Deleted

## 2012-03-06 LAB — GLUCOSE, CAPILLARY
Glucose-Capillary: 112 mg/dL — ABNORMAL HIGH (ref 70–99)
Glucose-Capillary: 103 mg/dL — ABNORMAL HIGH (ref 70–99)
Glucose-Capillary: 116 mg/dL — ABNORMAL HIGH (ref 70–99)
Glucose-Capillary: 107 mg/dL — ABNORMAL HIGH (ref 70–99)

## 2012-03-06 NOTE — Progress Notes (Signed)
Patient ID: Becky Hobbs, female   DOB: 10-01-1946, 66 y.o.   MRN: [id number] Patient is status post lumbar spine surgery. She is showing good progress she has been out of bed. Anticipate discharge to home on Monday.

## 2012-03-06 NOTE — Progress Notes (Signed)
Physical Therapy Treatment Patient Details Name: Becky Hobbs MRN: [id number] DOB: 04-24-1946 Today's Date: 03/06/2012 Time: [id number] PT Time Calculation (min): 24 min  PT Assessment / Plan / Recommendation Comments on Treatment Session  Pt admitted s/p microdiscetomy and continues to progress.  Pt able to tolerate increased ambulation distance with increased independence today as well as stair negotiation.  Pt ready for safe d/c home once medically cleared by MD.    Follow Up Recommendations  No PT follow up    Barriers to Discharge        Equipment Recommendations  None recommended by OT;None recommended by PT    Recommendations for Other Services    Frequency Min 5X/week   Plan Discharge plan remains appropriate;Frequency remains appropriate    Precautions / Restrictions Precautions Precautions: Back Precaution Booklet Issued: No Precaution Comments: Pt able to recall 2/3 back precautions.  Cues to reeducate pt on 3/3 back precautions. Restrictions Weight Bearing Restrictions: No   Pertinent Vitals/Pain 5/10 in back.  Pt repositioned.    Mobility  Bed Mobility Bed Mobility: Rolling Left;Left Sidelying to Sit Rolling Left: 6: Modified independent (Device/Increase time) Left Sidelying to Sit: 6: Modified independent (Device/Increase time) Transfers Transfers: Sit to Stand;Stand to Sit (2 trials.) Sit to Stand: 5: Supervision;With upper extremity assist;From bed;From chair/3-in-1 Stand to Sit: 5: Supervision;With upper extremity assist;To chair/3-in-1 Details for Transfer Assistance: Verbal cues for safest hand placement. Ambulation/Gait Ambulation/Gait Assistance: 5: Supervision Ambulation Distance (Feet): 200 Feet Assistive device: Rolling walker Ambulation/Gait Assistance Details: Verbal cues for tall posture and safety inside RW. Gait Pattern: Decreased stride length;Narrow base of support Stairs: Yes Stairs Assistance: 4: Min assist Stairs Assistance Details  (indicate cue type and reason): Assist for balance with cues for sequence using one rail. Stair Management Technique: One rail Left;Forwards;Alternating pattern Number of Stairs: 2  Wheelchair Mobility Wheelchair Mobility: No    Exercises     PT Diagnosis:    PT Problem List:   PT Treatment Interventions:     PT Goals Acute Rehab PT Goals PT Goal Formulation: With patient Time For Goal Achievement: 03/12/12 Potential to Achieve Goals: Good PT Goal: Rolling Supine to Left Side - Progress: Met PT Goal: Supine/Side to Sit - Progress: Met PT Goal: Sit to Stand - Progress: Progressing toward goal PT Goal: Stand to Sit - Progress: Progressing toward goal PT Goal: Ambulate - Progress: Progressing toward goal PT Goal: Up/Down Stairs - Progress: Progressing toward goal  Visit Information  Last PT Received On: 03/06/12 Assistance Needed: +1    Subjective Data  Subjective: "I've walked alot." Patient Stated Goal: "Go home."   Cognition  Overall Cognitive Status: Appears within functional limits for tasks assessed/performed Arousal/Alertness: Awake/alert Orientation Level: Appears intact for tasks assessed Behavior During Session: [facility] for tasks performed    Balance  Balance Balance Assessed: No  End of Session PT - End of Session Equipment Utilized During Treatment: Gait belt Activity Tolerance: Patient tolerated treatment well Patient left: in chair;with call bell/phone within reach Nurse Communication: Mobility status    Cyndia Bent 03/06/2012, 9:23 AM  03/06/2012 Cyndia Bent, PT, DPT [phone]

## 2012-03-07 LAB — GLUCOSE, CAPILLARY
Glucose-Capillary: 90 mg/dL (ref 70–99)
Glucose-Capillary: 100 mg/dL — ABNORMAL HIGH (ref 70–99)
Glucose-Capillary: 110 mg/dL — ABNORMAL HIGH (ref 70–99)

## 2012-03-07 MED ORDER — OXYCODONE-ACETAMINOPHEN 5-325 MG PO TABS: 2.0000 | ORAL_TABLET | Freq: Four times a day (QID) | ORAL | Status: AC | PRN

## 2012-03-07 MED ORDER — ESTROGENS CONJUGATED 0.3 MG PO TABS
0.3000 mg | ORAL_TABLET | Freq: Every day | ORAL | Status: DC
  Administered 2012-03-07: 0.3 mg via ORAL
  Filled 2012-03-07: qty 1

## 2012-03-07 MED FILL — Estrogens, Conjugated Tab 0.3 MG: ORAL | Qty: 1 | Status: CN

## 2012-03-07 MED FILL — Estrogens, Conjugated Tab 0.3 MG: ORAL | Qty: 1 | Status: AC

## 2012-03-07 NOTE — Progress Notes (Signed)
Physical Therapy Treatment Patient Details Name: Becky Hobbs MRN: [id number] DOB: August 23, 1946 Today's Date: 03/07/2012 Time: 1040-1107 PT Time Calculation (min): 27 min  PT Assessment / Plan / Recommendation Comments on Treatment Session  Pt eager to d/c home.  Moving well.      Follow Up Recommendations  No PT follow up    Barriers to Discharge        Equipment Recommendations  None recommended by OT;None recommended by PT    Recommendations for Other Services    Frequency Min 5X/week   Plan Discharge plan remains appropriate;Frequency remains appropriate    Precautions / Restrictions Precautions Precautions: Back Precaution Comments: Reviewed 3/3 back precautions & handout.   Restrictions Weight Bearing Restrictions: No       Mobility  Bed Mobility Bed Mobility: Rolling Right;Right Sidelying to Sit;Sit to Sidelying Right Rolling Right: 6: Modified independent (Device/Increase time) Right Sidelying to Sit: 6: Modified independent (Device/Increase time) Sit to Sidelying Right: 6: Modified independent (Device/Increase time) Transfers Transfers: Sit to Stand;Stand to Sit Sit to Stand: 5: Supervision;With upper extremity assist;From bed;With armrests;From chair/3-in-1 Stand to Sit: 5: Supervision;With upper extremity assist;With armrests;To chair/3-in-1;To bed Details for Transfer Assistance: Cues for hand placement.   Ambulation/Gait Ambulation/Gait Assistance: 5: Supervision Ambulation Distance (Feet): 175 Feet Assistive device: Rolling walker Ambulation/Gait Assistance Details: Encouragement to increase gait speed & increase step/stride length, & to relax UE's.  Gait Pattern: Step-through pattern Stairs: Yes Stairs Assistance: 4: Min guard Stairs Assistance Details (indicate cue type and reason): Guarding for safety.  Provided HHA on Rt side but pt did not wt bear through UE.   Stair Management Technique: One rail Left;Forwards;Alternating pattern Number of  Stairs: 2  Wheelchair Mobility Wheelchair Mobility: No    Exercises     PT Diagnosis:    PT Problem List:   PT Treatment Interventions:     PT Goals Acute Rehab PT Goals Time For Goal Achievement: 03/12/12 Potential to Achieve Goals: Good PT Goal: Rolling Supine to Right Side - Progress: Met PT Goal: Supine/Side to Sit - Progress: Met PT Goal: Sit to Supine/Side - Progress: Met PT Goal: Sit to Stand - Progress: Progressing toward goal PT Goal: Stand to Sit - Progress: Progressing toward goal PT Goal: Ambulate - Progress: Progressing toward goal PT Goal: Up/Down Stairs - Progress: Progressing toward goal  Visit Information  Last PT Received On: 03/07/12 Assistance Needed: +1    Subjective Data  Subjective: "Im going home"   Cognition  Overall Cognitive Status: Appears within functional limits for tasks assessed/performed Arousal/Alertness: Awake/alert Orientation Level: Appears intact for tasks assessed Behavior During Session: [facility] for tasks performed    Balance  Balance Balance Assessed: No  End of Session PT - End of Session Equipment Utilized During Treatment: Gait belt Activity Tolerance: Patient tolerated treatment well Patient left: in chair;with call bell/phone within reach    Sena Hitch 03/07/2012, 1:43 PM   Sarajane Marek, PTA [phone] 03/07/2012

## 2012-03-08 ENCOUNTER — Encounter (HOSPITAL_COMMUNITY): Payer: Self-pay | Admitting: Specialist

## 2012-03-15 NOTE — Discharge Summary (Signed)
Physician Discharge Summary  Patient ID: Becky Hobbs MRN: [id number] DOB/AGE: [birth year] 66 y.o.  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 Hypertension   . H/O hiatal hernia     surgery fixed  . Diabetes mellitus     borderline  . Arthritis   . Anxiety     panic attacks  . Asthma   . Hypothyroidism   . Heart murmur     MVP  . Blood transfusion     1973--with twins birth    Surgeries: Procedure(s): Central decompression L3-4, excision of synovial cyst right L3-4   Consultants (if any):   none  Discharged Condition: Improved  Hospital Course: Becky Hobbs is an 66 y.o. female who was admitted 03/04/2012 with a diagnosis of Lumbar spinal stenosis and went to the operating room on 03/04/2012 and underwent the above named procedures.    She was given perioperative antibiotics:  Anti-infectives     Start     Dose/Rate Route Frequency Ordered Stop   03/04/12 1745   ceFAZolin (ANCEF) IVPB 1 g/50 mL premix        1 g 100 mL/hr over 30 Minutes Intravenous Every 8 hours 03/04/12 1730 03/05/12 0430   03/03/12 1122   vancomycin (VANCOCIN) IVPB 1000 mg/200 mL premix        1,000 mg 200 mL/hr over 60 Minutes Intravenous 60 min pre-op 03/03/12 1122 03/04/12 1338        .  She was given sequential compression devices, early ambulation. Pt was ambulating independently prior to  discharge after working with physical therapy.  Pt was eating well and having bowel movement and voiding well.  She benefited maximally from the hospital stay and there were no complications.    Recent vital signs:  Filed Vitals:   03/07/12 0614  BP: 112/71  Pulse: 73  Temp: 98 F (36.7 C)  Resp: 18    Recent laboratory  studies:  Lab Results  Component Value Date   HGB 11.6* 03/02/2012   HGB 11.5* 11/06/2010   HGB 10.9* 09/15/2010   Lab Results  Component Value Date   WBC 8.5 03/02/2012   PLT 244 03/02/2012   Lab Results  Component Value Date   INR 1.06 09/09/2010   Lab Results  Component Value Date   NA 138 03/02/2012   K 4.3 03/02/2012   CL 100 03/02/2012   CO2 27 03/02/2012   BUN 21 03/02/2012   CREATININE 1.46* 03/02/2012   GLUCOSE 103* 03/02/2012    Discharge Medications:   Medication List  As of 03/15/2012 10:22 AM   STOP taking these medications         zolpidem 10 MG tablet         TAKE these medications         allopurinol 100 MG tablet   Commonly known as: ZYLOPRIM   Take 100 mg by mouth daily.      cetirizine 10 MG tablet   Commonly known as: ZYRTEC   Take 10 mg by mouth daily.      Cholecalciferol 2000 UNITS Caps   Take 4,000 Units by mouth daily.      diazepam 5 MG tablet   Commonly known as: VALIUM   Take 5 mg by  mouth every 6 (six) hours as needed. Panic attacks.      estrogens conjugated (synthetic A) 0.3 MG tablet   Commonly known as: CENESTIN   Take 0.3 mg by mouth daily.      gabapentin 300 MG capsule   Commonly known as: NEURONTIN   Take 300-600 mg by mouth 3 (three) times daily. Takes 1 in am and noon, and 2 in pm.      JOINT HEALTH PO   Take 1 tablet by mouth daily.      Krill Oil 500 MG Caps   Take 500 mg by mouth daily.      levothyroxine 75 MCG tablet   Commonly known as: SYNTHROID, LEVOTHROID   Take 75 mcg by mouth daily.      Maxaron Forte Caps   Take 1 capsule by mouth every other day.      metFORMIN 500 MG tablet   Commonly known as: GLUCOPHAGE   Take 500 mg by mouth 2 (two) times daily with a meal.      metoprolol succinate 50 MG 24 hr tablet   Commonly known as: TOPROL-XL   Take 100 mg by mouth daily. Take with or immediately following a meal.      mulitivitamin with minerals Tabs   Take 1 tablet by mouth daily.       oxyCODONE-acetaminophen 5-325 MG per tablet   Commonly known as: PERCOCET   Take 2 tablets by mouth every 6 (six) hours as needed for pain.      pantoprazole 40 MG tablet   Commonly known as: PROTONIX   Take 40 mg by mouth daily.      PATANOL 0.1 % ophthalmic solution   Generic drug: olopatadine   1 drop 2 (two) times daily as needed.      potassium chloride SA 20 MEQ tablet   Commonly known as: K-DUR,KLOR-CON   Take 20 mEq by mouth daily as needed. Takes with torsemide.      torsemide 20 MG tablet   Commonly known as: DEMADEX   Take 20 mg by mouth daily as needed. For edema.            Diagnostic Studies: Dg Lumbar Spine 1 View  2012/03/13  *RADIOLOGY REPORT*  Clinical Data: Lumbar laminectomy  LUMBAR SPINE - 1 VIEW  Comparison: MRI 09/07/2011  Findings: Single film shows a probe between the spinous processes of L2 and L3.  There is been previous fusion from L4 to the sacrum.  IMPRESSION: Probe between the spinous processes of L2 and L3.  Original Report Authenticated By: Jules Schick, M.D.    Disposition: 01-Home or Self Care Pt instructed to change dressing daily or as needed.  May shower at home.  Keep wound covered.  Ambulation as tolerated.  No bending, lifting or twisting.   Follow-up Information    Follow up with NITKA,JAMES E, MD. Schedule an appointment as soon as possible for a visit in 2 weeks.   Contact information:   [facility] [address] [phone]           Signed: Epimenio Foot 03/15/2012, 10:22 AM

## 2012-03-15 NOTE — Discharge Instructions (Signed)
Walk as tolerated.  No bending lifting or twisting.  Change dressing daily or as needed.  May shower

## 2012-04-21 ENCOUNTER — Ambulatory Visit: Payer: BC Managed Care – PPO | Admitting: Physical Therapy

## 2012-04-26 ENCOUNTER — Ambulatory Visit: Payer: BC Managed Care – PPO | Attending: Specialist | Admitting: Physical Therapy

## 2012-04-26 DIAGNOSIS — M545 Low back pain, unspecified: Secondary | ICD-10-CM | POA: Insufficient documentation

## 2012-04-26 DIAGNOSIS — M2569 Stiffness of other specified joint, not elsewhere classified: Secondary | ICD-10-CM | POA: Insufficient documentation

## 2012-04-26 DIAGNOSIS — M25559 Pain in unspecified hip: Secondary | ICD-10-CM | POA: Insufficient documentation

## 2012-04-26 DIAGNOSIS — IMO0001 Reserved for inherently not codable concepts without codable children: Secondary | ICD-10-CM | POA: Insufficient documentation

## 2012-04-28 ENCOUNTER — Ambulatory Visit: Payer: BC Managed Care – PPO | Admitting: Physical Therapy

## 2012-05-03 ENCOUNTER — Ambulatory Visit: Payer: BC Managed Care – PPO | Admitting: Physical Therapy

## 2012-05-05 ENCOUNTER — Ambulatory Visit: Payer: BC Managed Care – PPO | Admitting: Physical Therapy

## 2012-05-10 ENCOUNTER — Other Ambulatory Visit: Payer: Self-pay | Admitting: Specialist

## 2012-05-10 DIAGNOSIS — M4310 Spondylolisthesis, site unspecified: Secondary | ICD-10-CM

## 2012-05-12 ENCOUNTER — Ambulatory Visit
Admission: RE | Admit: 2012-05-12 | Discharge: 2012-05-12 | Disposition: A | Discharge status: Home / Self Care | Payer: BC Managed Care – PPO | Source: Ambulatory Visit | Attending: Specialist | Admitting: Specialist

## 2012-05-12 VITALS — BP 115/51 | HR 71

## 2012-05-12 DIAGNOSIS — M48061 Spinal stenosis, lumbar region without neurogenic claudication: Secondary | ICD-10-CM

## 2012-05-12 DIAGNOSIS — M4310 Spondylolisthesis, site unspecified: Secondary | ICD-10-CM

## 2012-05-12 MED ORDER — IOHEXOL 180 MG/ML  SOLN
17.0000 mL | Freq: Once | INTRAMUSCULAR | Status: AC | PRN
  Administered 2012-05-12: 17 mL via INTRATHECAL

## 2012-05-12 MED ORDER — DIAZEPAM 5 MG PO TABS
10.0000 mg | ORAL_TABLET | Freq: Once | ORAL | Status: AC
  Administered 2012-05-12: 10 mg via ORAL

## 2012-06-27 ENCOUNTER — Other Ambulatory Visit: Payer: Self-pay | Admitting: Specialist

## 2012-06-27 DIAGNOSIS — R2689 Other abnormalities of gait and mobility: Secondary | ICD-10-CM

## 2012-06-27 DIAGNOSIS — R278 Other lack of coordination: Secondary | ICD-10-CM

## 2012-07-01 ENCOUNTER — Ambulatory Visit
Admission: RE | Admit: 2012-07-01 | Discharge: 2012-07-01 | Disposition: A | Discharge status: Home / Self Care | Payer: Self-pay | Source: Ambulatory Visit | Attending: Specialist | Admitting: Specialist

## 2012-07-01 DIAGNOSIS — R278 Other lack of coordination: Secondary | ICD-10-CM

## 2012-07-01 DIAGNOSIS — R2689 Other abnormalities of gait and mobility: Secondary | ICD-10-CM

## 2013-03-15 ENCOUNTER — Emergency Department (HOSPITAL_COMMUNITY)
Admission: EM | Admit: 2013-03-15 | Discharge: 2013-03-15 | Disposition: A | Discharge status: Home / Self Care | Payer: BC Managed Care – PPO | Attending: Emergency Medicine | Admitting: Emergency Medicine

## 2013-03-15 ENCOUNTER — Emergency Department (HOSPITAL_COMMUNITY): Payer: BC Managed Care – PPO

## 2013-03-15 ENCOUNTER — Encounter (HOSPITAL_COMMUNITY): Payer: Self-pay | Admitting: *Deleted

## 2013-03-15 DIAGNOSIS — M129 Arthropathy, unspecified: Secondary | ICD-10-CM | POA: Insufficient documentation

## 2013-03-15 DIAGNOSIS — J45909 Unspecified asthma, uncomplicated: Secondary | ICD-10-CM | POA: Insufficient documentation

## 2013-03-15 DIAGNOSIS — I10 Essential (primary) hypertension: Secondary | ICD-10-CM | POA: Insufficient documentation

## 2013-03-15 DIAGNOSIS — R42 Dizziness and giddiness: Secondary | ICD-10-CM | POA: Insufficient documentation

## 2013-03-15 DIAGNOSIS — R011 Cardiac murmur, unspecified: Secondary | ICD-10-CM | POA: Insufficient documentation

## 2013-03-15 DIAGNOSIS — Z880 Allergy status to penicillin: Secondary | ICD-10-CM | POA: Insufficient documentation

## 2013-03-15 DIAGNOSIS — Z8639 Personal history of other endocrine, nutritional and metabolic disease: Secondary | ICD-10-CM | POA: Insufficient documentation

## 2013-03-15 DIAGNOSIS — F29 Unspecified psychosis not due to a substance or known physiological condition: Secondary | ICD-10-CM | POA: Insufficient documentation

## 2013-03-15 DIAGNOSIS — Z79899 Other long term (current) drug therapy: Secondary | ICD-10-CM | POA: Insufficient documentation

## 2013-03-15 DIAGNOSIS — R279 Unspecified lack of coordination: Secondary | ICD-10-CM | POA: Insufficient documentation

## 2013-03-15 DIAGNOSIS — E039 Hypothyroidism, unspecified: Secondary | ICD-10-CM | POA: Insufficient documentation

## 2013-03-15 DIAGNOSIS — Z862 Personal history of diseases of the blood and blood-forming organs and certain disorders involving the immune mechanism: Secondary | ICD-10-CM | POA: Insufficient documentation

## 2013-03-15 DIAGNOSIS — F410 Panic disorder [episodic paroxysmal anxiety] without agoraphobia: Secondary | ICD-10-CM | POA: Insufficient documentation

## 2013-03-15 DIAGNOSIS — Z8719 Personal history of other diseases of the digestive system: Secondary | ICD-10-CM | POA: Insufficient documentation

## 2013-03-15 LAB — CBC WITH DIFFERENTIAL/PLATELET
Basophils Absolute: 0 10*3/uL (ref 0.0–0.1)
Basophils Relative: 0 % (ref 0–1)
Eosinophils Absolute: 0.3 10*3/uL (ref 0.0–0.7)
Eosinophils Relative: 3 % (ref 0–5)
HCT: 41.1 % (ref 36.0–46.0)
Hemoglobin: 13.6 g/dL (ref 12.0–15.0)
Lymphocytes Relative: 27 % (ref 12–46)
Lymphs Abs: 2.2 10*3/uL (ref 0.7–4.0)
MCH: 29.4 pg (ref 26.0–34.0)
MCHC: 33.1 g/dL (ref 30.0–36.0)
MCV: 88.8 fL (ref 78.0–100.0)
Monocytes Absolute: 0.8 10*3/uL (ref 0.1–1.0)
Monocytes Relative: 9 % (ref 3–12)
Neutro Abs: 5 10*3/uL (ref 1.7–7.7)
Neutrophils Relative %: 60 % (ref 43–77)
Platelets: 263 10*3/uL (ref 150–400)
RBC: 4.63 MIL/uL (ref 3.87–5.11)
RDW: 14.5 % (ref 11.5–15.5)
WBC: 8.2 10*3/uL (ref 4.0–10.5)

## 2013-03-15 LAB — COMPREHENSIVE METABOLIC PANEL
ALT: 72 U/L — ABNORMAL HIGH (ref 0–35)
AST: 113 U/L — ABNORMAL HIGH (ref 0–37)
Albumin: 3 g/dL — ABNORMAL LOW (ref 3.5–5.2)
Alkaline Phosphatase: 155 U/L — ABNORMAL HIGH (ref 39–117)
BUN: 11 mg/dL (ref 6–23)
CO2: 24 mEq/L (ref 19–32)
Calcium: 9.2 mg/dL (ref 8.4–10.5)
Chloride: 102 mEq/L (ref 96–112)
Creatinine, Ser: 1.31 mg/dL — ABNORMAL HIGH (ref 0.50–1.10)
GFR calc Af Amer: 49 mL/min — ABNORMAL LOW (ref >90)
GFR calc non Af Amer: 42 mL/min — ABNORMAL LOW (ref >90)
Glucose, Bld: 119 mg/dL — ABNORMAL HIGH (ref 70–99)
Potassium: 4.1 mEq/L (ref 3.5–5.1)
Sodium: 136 mEq/L (ref 135–145)
Total Bilirubin: 0.5 mg/dL (ref 0.3–1.2)
Total Protein: 6.9 g/dL (ref 6.0–8.3)

## 2013-03-15 LAB — TROPONIN I: Troponin I: <0.3 ng/mL (ref <0.30)

## 2013-03-15 NOTE — ED Provider Notes (Signed)
History     CSN: [id number]  Arrival date & time 03/15/13  1958   First MD Initiated Contact with Patient 03/15/13 2001      Chief Complaint  Patient presents with  . Altered Mental Status    (Consider location/radiation/quality/duration/timing/severity/associated sxs/prior treatment) HPI Comments: Patient presents with complaints of episodic dizziness, unsteadiness on her feet for the past two weeks causing her to fall.  She was seen several weeks ago after a slip and fall at family's home and she struck her head.  A head ct at [facility] at that time was negative.  She tells me that when she attempts to ambulate, she falls to the side.  Family has noticed she seems confused at times and is somewhat slow to respond.  Patient is a 67 y.o. female presenting with altered mental status. The history is provided by the patient.  Altered Mental Status Presenting symptoms: confusion and disorientation   Presenting symptoms comment:  Unsteadiness Severity:  Moderate Timing:  Intermittent Progression:  Worsening Associated symptoms: no fever     Past Medical History  Diagnosis Date  . Hypertension   . H/O hiatal hernia     surgery fixed  . Diabetes mellitus     borderline  . Arthritis   . Anxiety     panic attacks  . Asthma   . Hypothyroidism   . Heart murmur     MVP  . Blood transfusion     1973--with twins birth    Past Surgical History  Procedure Laterality Date  . Cholecystectomy    . Back surgery    . Tonsillectomy    . Appendectomy    . Abdominal hysterectomy    . Bladder tack    . Breast cyst incision and drainage    . Breast surgery    . Ovarian cyst surgery    . Nasal septum surgery    . Fracture surgery      left wrist  . Nissan fundoplication    . Bunionectomy    . Lumbar laminectomy  03/04/2012    Procedure: MICRODISCECTOMY LUMBAR LAMINECTOMY;  Surgeon: Jessy Oto, MD;  Location: [place];  Service: Orthopedics;  Laterality: N/A;  L3-4 central laminectomy     No family history on file.  History  Substance Use Topics  . Smoking status: Never Smoker   . Smokeless tobacco: Never Used  . Alcohol Use: Yes    OB History   Grav Para Term Preterm Abortions TAB SAB Ect Mult Living                  Review of Systems  Constitutional: Negative for fever and chills.  Psychiatric/Behavioral: Positive for confusion and altered mental status.  All other systems reviewed and are negative.    Allergies  Morphine and related; Erythromycin; Hydrocodone; and Penicillins  Home Medications   Current Outpatient Rx  Name  Route  Sig  Dispense  Refill  . allopurinol (ZYLOPRIM) 100 MG tablet   Oral   Take 100 mg by mouth daily.         . cetirizine (ZYRTEC) 10 MG tablet   Oral   Take 10 mg by mouth daily.         . Cholecalciferol 2000 UNITS CAPS   Oral   Take 4,000 Units by mouth daily.         . diazepam (VALIUM) 5 MG tablet   Oral   Take 5 mg by mouth every 6 (six) hours  as needed. Panic attacks.         . estrogens conjugated, synthetic A, (CENESTIN) 0.3 MG tablet   Oral   Take 0.3 mg by mouth daily.         Marland Kitchen Fe Bisgly-Fe Polysac-C-B12-FA (MAXARON FORTE) CAPS   Oral   Take 1 capsule by mouth every other day.         . gabapentin (NEURONTIN) 300 MG capsule   Oral   Take 300-600 mg by mouth 3 (three) times daily. Takes 1 in am and noon, and 2 in pm.         . Glucosamine-MSM-Hyaluronic Acd (JOINT HEALTH PO)   Oral   Take 1 tablet by mouth daily.         Javier Docker Oil 500 MG CAPS   Oral   Take 500 mg by mouth daily.         Marland Kitchen levothyroxine (SYNTHROID, LEVOTHROID) 75 MCG tablet   Oral   Take 75 mcg by mouth daily.         . metFORMIN (GLUCOPHAGE) 500 MG tablet   Oral   Take 500 mg by mouth 2 (two) times daily with a meal.         . metoprolol succinate (TOPROL-XL) 50 MG 24 hr tablet   Oral   Take 100 mg by mouth daily. Take with or immediately following a meal.         . Multiple Vitamin  (MULITIVITAMIN WITH MINERALS) TABS   Oral   Take 1 tablet by mouth daily.         Marland Kitchen olopatadine (PATANOL) 0.1 % ophthalmic solution      1 drop 2 (two) times daily as needed.         . pantoprazole (PROTONIX) 40 MG tablet   Oral   Take 40 mg by mouth daily.         . potassium chloride SA (K-DUR,KLOR-CON) 20 MEQ tablet   Oral   Take 20 mEq by mouth daily as needed. Takes with torsemide.         . torsemide (DEMADEX) 20 MG tablet   Oral   Take 20 mg by mouth daily as needed. For edema.           BP 125/56  Pulse 83  Temp(Src) 98.2 F (36.8 C) (Oral)  Resp 20  Wt 200 lb (90.719 kg)  BMI 34.31 kg/m2  SpO2 100%  Physical Exam  Nursing note and vitals reviewed. Constitutional: She is oriented to person, place, and time. She appears well-developed and well-nourished. No distress.  HENT:  Head: Normocephalic and atraumatic.  Neck: Normal range of motion. Neck supple.  Cardiovascular: Normal rate and regular rhythm.  Exam reveals no gallop and no friction rub.   No murmur heard. Pulmonary/Chest: Effort normal and breath sounds normal. No respiratory distress. She has no wheezes.  Abdominal: Soft. Bowel sounds are normal. She exhibits no distension. There is no tenderness.  Musculoskeletal: Normal range of motion.  Neurological: She is alert and oriented to person, place, and time. No cranial nerve deficit. She exhibits normal muscle tone. Coordination normal.  She is somewhat slow to respond to commands and questions but is appropriate in her answers and actions.    Skin: Skin is warm and dry. She is not diaphoretic.    ED Course  Procedures (including critical care time)  Labs Reviewed  CBC WITH DIFFERENTIAL  COMPREHENSIVE METABOLIC PANEL  TROPONIN I   No results found.  No diagnosis found.   Date: 03/16/2013  Rate: 82  Rhythm: normal sinus rhythm  QRS Axis: normal  Intervals: normal  ST/T Wave abnormalities: normal  Conduction Disutrbances:none   Narrative Interpretation:   Old EKG Reviewed: none available    MDM  The patient presents with complaints of ataxia for the past two weeks.  The workup reveals a normal head ct, labs that are essentially unremarkable, normal ekg, and neuro exam that is basically non-focal.  I am unsure as to what the exact etiology of her symptoms are, however nothing appears emergent.  I have spoken with Dr. Doy Mince from neurology who does not feel as though admission is necessary given the length of her symptoms.  She will be started on aspirin 81 mg daily and an outpatient mri will be ordered.  To return prn.      Veryl Speak, MD 03/16/13 [phone]

## 2013-03-15 NOTE — ED Notes (Signed)
Pt and family states understanding of discharge instructions
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
## 2013-03-15 NOTE — ED Notes (Signed)
Delo MD at bedside.
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
## 2013-04-11 ENCOUNTER — Encounter: Payer: Self-pay | Admitting: Neurology

## 2013-04-12 ENCOUNTER — Encounter: Payer: Self-pay | Admitting: Neurology

## 2013-04-12 ENCOUNTER — Ambulatory Visit (INDEPENDENT_AMBULATORY_CARE_PROVIDER_SITE_OTHER): Payer: BC Managed Care – PPO | Admitting: Neurology

## 2013-04-12 VITALS — BP 135/75 | HR 64 | Wt 209.0 lb

## 2013-04-12 DIAGNOSIS — S060XAA Concussion with loss of consciousness status unknown, initial encounter: Secondary | ICD-10-CM | POA: Insufficient documentation

## 2013-04-12 DIAGNOSIS — S069X9S Unspecified intracranial injury with loss of consciousness of unspecified duration, sequela: Secondary | ICD-10-CM

## 2013-04-12 DIAGNOSIS — R51 Headache: Secondary | ICD-10-CM

## 2013-04-12 DIAGNOSIS — S069XAS Unspecified intracranial injury with loss of consciousness status unknown, sequela: Secondary | ICD-10-CM

## 2013-04-12 DIAGNOSIS — R519 Headache, unspecified: Secondary | ICD-10-CM | POA: Insufficient documentation

## 2013-04-12 DIAGNOSIS — S060X0S Concussion without loss of consciousness, sequela: Secondary | ICD-10-CM

## 2013-04-12 DIAGNOSIS — S060X9A Concussion with loss of consciousness of unspecified duration, initial encounter: Secondary | ICD-10-CM | POA: Insufficient documentation

## 2013-04-12 MED ORDER — SUMATRIPTAN SUCCINATE 25 MG PO TABS: 25.0000 mg | ORAL_TABLET | ORAL | Status: DC | PRN

## 2013-04-12 MED ORDER — ONDANSETRON HCL 4 MG PO TABS: 4.0000 mg | ORAL_TABLET | Freq: Three times a day (TID) | ORAL | Status: DC | PRN

## 2013-04-12 MED ORDER — NORTRIPTYLINE HCL 10 MG PO CAPS: 20.0000 mg | ORAL_CAPSULE | Freq: Every day | ORAL | Status: DC
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
## 2013-04-21 DIAGNOSIS — R269 Unspecified abnormalities of gait and mobility: Secondary | ICD-10-CM

## 2013-04-24 ENCOUNTER — Other Ambulatory Visit: Payer: Self-pay | Admitting: Neurology

## 2013-04-24 DIAGNOSIS — R519 Headache, unspecified: Secondary | ICD-10-CM

## 2013-04-24 DIAGNOSIS — S060X0S Concussion without loss of consciousness, sequela: Secondary | ICD-10-CM

## 2013-04-25 ENCOUNTER — Ambulatory Visit: Payer: BC Managed Care – PPO | Attending: Neurology | Admitting: Physical Therapy

## 2013-04-25 DIAGNOSIS — IMO0001 Reserved for inherently not codable concepts without codable children: Secondary | ICD-10-CM | POA: Insufficient documentation

## 2013-04-25 DIAGNOSIS — M545 Low back pain, unspecified: Secondary | ICD-10-CM | POA: Insufficient documentation

## 2013-04-25 DIAGNOSIS — M542 Cervicalgia: Secondary | ICD-10-CM | POA: Insufficient documentation

## 2013-04-25 DIAGNOSIS — R262 Difficulty in walking, not elsewhere classified: Secondary | ICD-10-CM | POA: Insufficient documentation

## 2013-04-25 DIAGNOSIS — R51 Headache: Secondary | ICD-10-CM | POA: Insufficient documentation

## 2013-04-25 NOTE — Progress Notes (Signed)
Quick Note:  Please call patient, MRI cervical showed degenerative changes, mild canal and moderate left greater than right foraminal narrowing. She should continue follow up appointment  ______

## 2013-04-26 ENCOUNTER — Ambulatory Visit: Payer: BC Managed Care – PPO | Admitting: Neurology

## 2013-04-28 ENCOUNTER — Ambulatory Visit: Payer: BC Managed Care – PPO | Admitting: Physical Therapy

## 2013-05-01 ENCOUNTER — Ambulatory Visit: Payer: BC Managed Care – PPO | Admitting: Physical Therapy

## 2013-05-04 ENCOUNTER — Ambulatory Visit: Payer: BC Managed Care – PPO | Admitting: Physical Therapy

## 2013-05-08 ENCOUNTER — Ambulatory Visit: Payer: BC Managed Care – PPO | Admitting: Physical Therapy

## 2013-05-09 ENCOUNTER — Telehealth: Payer: Self-pay | Admitting: Neurology

## 2013-05-09 NOTE — Telephone Encounter (Signed)
Spoke with pt and relayed the results of her MRI.  Pt understood and had no questions.  She was also reminded of all future appointments.

## 2013-05-11 ENCOUNTER — Ambulatory Visit: Payer: BC Managed Care – PPO | Admitting: Physical Therapy

## 2013-05-15 ENCOUNTER — Ambulatory Visit: Payer: Medicare Other | Attending: Neurology | Admitting: Physical Therapy

## 2013-05-15 DIAGNOSIS — M545 Low back pain, unspecified: Secondary | ICD-10-CM | POA: Diagnosis not present

## 2013-05-15 DIAGNOSIS — R51 Headache: Secondary | ICD-10-CM | POA: Diagnosis not present

## 2013-05-15 DIAGNOSIS — IMO0001 Reserved for inherently not codable concepts without codable children: Secondary | ICD-10-CM | POA: Insufficient documentation

## 2013-05-15 DIAGNOSIS — R262 Difficulty in walking, not elsewhere classified: Secondary | ICD-10-CM | POA: Insufficient documentation

## 2013-05-15 DIAGNOSIS — M542 Cervicalgia: Secondary | ICD-10-CM | POA: Diagnosis not present

## 2013-05-18 ENCOUNTER — Ambulatory Visit: Payer: Medicare Other | Admitting: Physical Therapy

## 2013-05-18 DIAGNOSIS — M545 Low back pain, unspecified: Secondary | ICD-10-CM | POA: Diagnosis not present

## 2013-05-18 DIAGNOSIS — M48061 Spinal stenosis, lumbar region without neurogenic claudication: Secondary | ICD-10-CM | POA: Diagnosis not present

## 2013-05-18 DIAGNOSIS — M4310 Spondylolisthesis, site unspecified: Secondary | ICD-10-CM | POA: Diagnosis not present

## 2013-05-22 ENCOUNTER — Ambulatory Visit: Payer: Medicare Other | Admitting: Physical Therapy

## 2013-05-25 ENCOUNTER — Ambulatory Visit: Payer: Medicare Other | Admitting: Physical Therapy

## 2013-05-30 ENCOUNTER — Telehealth: Payer: Self-pay | Admitting: Neurology

## 2013-05-30 ENCOUNTER — Ambulatory Visit: Payer: Medicare Other | Admitting: Physical Therapy

## 2013-05-30 NOTE — Telephone Encounter (Signed)
Called in Rx namenda xr 14mg  bid

## 2013-05-31 DIAGNOSIS — H1130 Conjunctival hemorrhage, unspecified eye: Secondary | ICD-10-CM | POA: Diagnosis not present

## 2013-06-01 ENCOUNTER — Ambulatory Visit: Payer: Medicare Other | Admitting: Physical Therapy

## 2013-06-02 ENCOUNTER — Telehealth: Payer: Self-pay | Admitting: Neurology

## 2013-06-02 NOTE — Telephone Encounter (Signed)
I have talked with her, she complains of dizziness with Nortriptyline 20mg  qhs, imitrex prn, which has been helpful. I adviser her to cut back nortriptyline 10mg  qhs.

## 2013-06-03 ENCOUNTER — Other Ambulatory Visit: Payer: Self-pay

## 2013-06-03 MED ORDER — NORTRIPTYLINE HCL 10 MG PO CAPS: 20.0000 mg | ORAL_CAPSULE | Freq: Every day | ORAL | Status: DC

## 2013-06-03 MED ORDER — SUMATRIPTAN SUCCINATE 25 MG PO TABS: 25.0000 mg | ORAL_TABLET | ORAL | Status: DC | PRN

## 2013-06-06 ENCOUNTER — Ambulatory Visit: Payer: Medicare Other | Admitting: Physical Therapy

## 2013-06-08 ENCOUNTER — Ambulatory Visit: Payer: Medicare Other | Admitting: Physical Therapy

## 2013-06-13 ENCOUNTER — Ambulatory Visit: Payer: Medicare Other | Attending: Neurology | Admitting: Physical Therapy

## 2013-06-13 DIAGNOSIS — M545 Low back pain, unspecified: Secondary | ICD-10-CM | POA: Insufficient documentation

## 2013-06-13 DIAGNOSIS — R51 Headache: Secondary | ICD-10-CM | POA: Diagnosis not present

## 2013-06-13 DIAGNOSIS — R262 Difficulty in walking, not elsewhere classified: Secondary | ICD-10-CM | POA: Diagnosis not present

## 2013-06-13 DIAGNOSIS — M542 Cervicalgia: Secondary | ICD-10-CM | POA: Insufficient documentation

## 2013-06-13 DIAGNOSIS — IMO0001 Reserved for inherently not codable concepts without codable children: Secondary | ICD-10-CM | POA: Insufficient documentation

## 2013-06-15 ENCOUNTER — Ambulatory Visit: Payer: Medicare Other | Admitting: Physical Therapy

## 2013-06-15 DIAGNOSIS — M542 Cervicalgia: Secondary | ICD-10-CM | POA: Diagnosis not present

## 2013-06-15 DIAGNOSIS — M545 Low back pain, unspecified: Secondary | ICD-10-CM | POA: Diagnosis not present

## 2013-06-15 DIAGNOSIS — IMO0001 Reserved for inherently not codable concepts without codable children: Secondary | ICD-10-CM | POA: Diagnosis not present

## 2013-06-15 DIAGNOSIS — R262 Difficulty in walking, not elsewhere classified: Secondary | ICD-10-CM | POA: Diagnosis not present

## 2013-06-15 DIAGNOSIS — R51 Headache: Secondary | ICD-10-CM | POA: Diagnosis not present

## 2013-06-19 ENCOUNTER — Ambulatory Visit: Payer: Medicare Other | Admitting: Physical Therapy

## 2013-06-19 DIAGNOSIS — R51 Headache: Secondary | ICD-10-CM | POA: Diagnosis not present

## 2013-06-19 DIAGNOSIS — M542 Cervicalgia: Secondary | ICD-10-CM | POA: Diagnosis not present

## 2013-06-19 DIAGNOSIS — IMO0001 Reserved for inherently not codable concepts without codable children: Secondary | ICD-10-CM | POA: Diagnosis not present

## 2013-06-19 DIAGNOSIS — M545 Low back pain, unspecified: Secondary | ICD-10-CM | POA: Diagnosis not present

## 2013-06-19 DIAGNOSIS — R262 Difficulty in walking, not elsewhere classified: Secondary | ICD-10-CM | POA: Diagnosis not present

## 2013-06-20 ENCOUNTER — Ambulatory Visit: Payer: Medicare Other | Admitting: Physical Therapy

## 2013-06-22 ENCOUNTER — Ambulatory Visit: Payer: Medicare Other | Admitting: Physical Therapy

## 2013-06-23 ENCOUNTER — Ambulatory Visit: Payer: Medicare Other | Admitting: Physical Therapy

## 2013-06-27 ENCOUNTER — Ambulatory Visit: Payer: Medicare Other | Admitting: Physical Therapy

## 2013-06-27 DIAGNOSIS — R51 Headache: Secondary | ICD-10-CM | POA: Diagnosis not present

## 2013-06-27 DIAGNOSIS — M545 Low back pain, unspecified: Secondary | ICD-10-CM | POA: Diagnosis not present

## 2013-06-27 DIAGNOSIS — IMO0001 Reserved for inherently not codable concepts without codable children: Secondary | ICD-10-CM | POA: Diagnosis not present

## 2013-06-27 DIAGNOSIS — M542 Cervicalgia: Secondary | ICD-10-CM | POA: Diagnosis not present

## 2013-06-27 DIAGNOSIS — R262 Difficulty in walking, not elsewhere classified: Secondary | ICD-10-CM | POA: Diagnosis not present

## 2013-06-29 ENCOUNTER — Ambulatory Visit: Payer: Medicare Other | Admitting: Physical Therapy

## 2013-06-29 DIAGNOSIS — M542 Cervicalgia: Secondary | ICD-10-CM | POA: Diagnosis not present

## 2013-06-29 DIAGNOSIS — M545 Low back pain, unspecified: Secondary | ICD-10-CM | POA: Diagnosis not present

## 2013-06-29 DIAGNOSIS — M47812 Spondylosis without myelopathy or radiculopathy, cervical region: Secondary | ICD-10-CM | POA: Diagnosis not present

## 2013-06-29 DIAGNOSIS — M5030 Other cervical disc degeneration, unspecified cervical region: Secondary | ICD-10-CM | POA: Diagnosis not present

## 2013-06-29 DIAGNOSIS — R51 Headache: Secondary | ICD-10-CM | POA: Diagnosis not present

## 2013-06-29 DIAGNOSIS — IMO0001 Reserved for inherently not codable concepts without codable children: Secondary | ICD-10-CM | POA: Diagnosis not present

## 2013-06-29 DIAGNOSIS — R262 Difficulty in walking, not elsewhere classified: Secondary | ICD-10-CM | POA: Diagnosis not present

## 2013-07-03 ENCOUNTER — Ambulatory Visit: Payer: Medicare Other | Admitting: Physical Therapy

## 2013-07-03 DIAGNOSIS — R51 Headache: Secondary | ICD-10-CM | POA: Diagnosis not present

## 2013-07-03 DIAGNOSIS — M545 Low back pain, unspecified: Secondary | ICD-10-CM | POA: Diagnosis not present

## 2013-07-03 DIAGNOSIS — M542 Cervicalgia: Secondary | ICD-10-CM | POA: Diagnosis not present

## 2013-07-03 DIAGNOSIS — IMO0001 Reserved for inherently not codable concepts without codable children: Secondary | ICD-10-CM | POA: Diagnosis not present

## 2013-07-03 DIAGNOSIS — R262 Difficulty in walking, not elsewhere classified: Secondary | ICD-10-CM | POA: Diagnosis not present

## 2013-07-04 DIAGNOSIS — I10 Essential (primary) hypertension: Secondary | ICD-10-CM | POA: Diagnosis not present

## 2013-07-04 DIAGNOSIS — R7402 Elevation of levels of lactic acid dehydrogenase (LDH): Secondary | ICD-10-CM | POA: Diagnosis not present

## 2013-07-04 DIAGNOSIS — R51 Headache: Secondary | ICD-10-CM | POA: Diagnosis not present

## 2013-07-04 DIAGNOSIS — R82998 Other abnormal findings in urine: Secondary | ICD-10-CM | POA: Diagnosis not present

## 2013-07-04 DIAGNOSIS — Z23 Encounter for immunization: Secondary | ICD-10-CM | POA: Diagnosis not present

## 2013-07-04 DIAGNOSIS — N3000 Acute cystitis without hematuria: Secondary | ICD-10-CM | POA: Diagnosis not present

## 2013-07-04 DIAGNOSIS — R7401 Elevation of levels of liver transaminase levels: Secondary | ICD-10-CM | POA: Diagnosis not present

## 2013-07-04 DIAGNOSIS — Z6838 Body mass index (BMI) 38.0-38.9, adult: Secondary | ICD-10-CM | POA: Diagnosis not present

## 2013-07-04 DIAGNOSIS — R3915 Urgency of urination: Secondary | ICD-10-CM | POA: Diagnosis not present

## 2013-07-06 ENCOUNTER — Ambulatory Visit (INDEPENDENT_AMBULATORY_CARE_PROVIDER_SITE_OTHER): Payer: Medicare Other | Admitting: Nurse Practitioner

## 2013-07-06 ENCOUNTER — Ambulatory Visit: Payer: Medicare Other | Admitting: Physical Therapy

## 2013-07-06 ENCOUNTER — Other Ambulatory Visit: Payer: Self-pay | Admitting: Nurse Practitioner

## 2013-07-06 ENCOUNTER — Encounter: Payer: Self-pay | Admitting: Nurse Practitioner

## 2013-07-06 VITALS — BP 138/76 | HR 84 | Temp 97.9°F | Ht 65.0 in | Wt 220.0 lb

## 2013-07-06 DIAGNOSIS — M545 Low back pain, unspecified: Secondary | ICD-10-CM | POA: Diagnosis not present

## 2013-07-06 DIAGNOSIS — R519 Headache, unspecified: Secondary | ICD-10-CM

## 2013-07-06 DIAGNOSIS — M542 Cervicalgia: Secondary | ICD-10-CM | POA: Diagnosis not present

## 2013-07-06 DIAGNOSIS — R51 Headache: Secondary | ICD-10-CM

## 2013-07-06 DIAGNOSIS — S069X9S Unspecified intracranial injury with loss of consciousness of unspecified duration, sequela: Secondary | ICD-10-CM | POA: Diagnosis not present

## 2013-07-06 DIAGNOSIS — R262 Difficulty in walking, not elsewhere classified: Secondary | ICD-10-CM | POA: Diagnosis not present

## 2013-07-06 DIAGNOSIS — S069XAS Unspecified intracranial injury with loss of consciousness status unknown, sequela: Secondary | ICD-10-CM | POA: Diagnosis not present

## 2013-07-06 DIAGNOSIS — F0781 Postconcussional syndrome: Secondary | ICD-10-CM

## 2013-07-06 DIAGNOSIS — IMO0001 Reserved for inherently not codable concepts without codable children: Secondary | ICD-10-CM | POA: Diagnosis not present

## 2013-07-06 DIAGNOSIS — S060X0S Concussion without loss of consciousness, sequela: Secondary | ICD-10-CM

## 2013-07-06 DIAGNOSIS — E669 Obesity, unspecified: Secondary | ICD-10-CM | POA: Insufficient documentation

## 2013-07-06 NOTE — Progress Notes (Signed)
[facility]  PATIENT: Becky Hobbs DOB: 12-11-1945   REASON FOR VISIT: follow up HISTORY FROM: patient  HISTORY OF PRESENT ILLNESS: 04/12/13 (YY): Becky Hobbs is a 67 years old right-handed African American female, referred by her primary care physician Dr. Marton Redwood for evaluation of headaches, concussion, and gait difficulty  She had past medical history of hypertension, diabetes, hypothyroidism, in May 12th 2014, she came downstairs, stepped into a puddle of water, fell backwards, landed on her occipital area, there was no loss of consciousness, she could go on cleaning the floor, it was a leakage from 3 gallon water container, she has no difficulty completing the task, afterwards, she drove to her nephew's house, by then she was noticed to stumbled over, couple days later, she began to have frequent headaches, vertex bilateral retro-orbital area severe pounding headaches with associated light noise sensitivity, she also complains of constant gait difficulty, bilateral lower extremity tremorish sensation when bearing weight,  She also has difficulty sleeping, her headache are pouding, movements make it worse, with associated light noise sensitivity, she denies visual loss, she complains of neck pain, radiating pain to her bilateral shoulder. She denies bowel and bladder incontinence  CT head without contrast in June 2014 showed no acute lesions,  MRI of cervical spine in September 2013 evaluating for gait difficulty, bilateral upper extremity paresthesia showed multilevel degenerative disc disease, most severe C5-6, C6 and 7, but no significant canal stenosis.   UPDATE 07/06/13 (LL):  Becky Hobbs returns for follow up, 2 months since last visit.  She states that headaches have not improved on Nortriptyline, and she thinks she has more memory problems and blurred vision since using it.   On 04/21/13 scan of cervical spine showing prominent spondylitic changes most prominent at  C5-6 and C6-7 where there is a broad-based disc osteophyte complexes resulting in mild canal and moderate left greater than right foraminal narrowing. Overall no significant changes compared with previous MRI scan dated 07/01/2012.  She recently started Tramadol XR for back pain.  She is still attending twice weekly PT for gait and balance training with some improvement.  She cannot take NSAIDS due to chronic renal insufficiency and she has reduced Tylenol due to recent elevation in liver enzymes; Dr. Brigitte Pulse is following.  Sumatriptan helps with severe headache, but she has frequent right temporal pain that is throbbing at times.  She reports throbbing quality at times and pain with chewing.  REVIEW OF SYSTEMS: Full 14 system review of systems performed and notable only for confusion, headache, memory loss, insomnia, sleepiness  ALLERGIES: Allergies  Allergen Reactions  . Morphine And Related Itching and Other (See Comments)    Hives (moderate severity)  . Erythromycin Hives and Itching  . Hydrocodone Itching  . Penicillins Hives    HOME MEDICATIONS: Outpatient Prescriptions Prior to Visit  Medication Sig Dispense Refill  . allopurinol (ZYLOPRIM) 100 MG tablet Take 100 mg by mouth daily.      Marland Kitchen ALPRAZolam (XANAX) 0.5 MG tablet Take 0.5 mg by mouth daily.      Marland Kitchen amLODipine (NORVASC) 5 MG tablet Take 5 mg by mouth daily.      . cetirizine (ZYRTEC) 10 MG tablet Take 10 mg by mouth daily.      . Cholecalciferol 2000 UNITS CAPS Take 4,000 Units by mouth daily.      Marland Kitchen gabapentin (NEURONTIN) 300 MG capsule Take 300-600 mg by mouth 3 (three) times daily. Takes 1 in am and noon, and 2  in pm.      . Krill Oil 500 MG CAPS Take 500 mg by mouth daily.      Marland Kitchen levothyroxine (SYNTHROID, LEVOTHROID) 75 MCG tablet Take 75 mcg by mouth daily.      . metoprolol succinate (TOPROL-XL) 50 MG 24 hr tablet Take 100 mg by mouth daily. Take with or immediately following a meal.      . Multiple Vitamin (MULITIVITAMIN  WITH MINERALS) TABS Take 1 tablet by mouth daily.      . nortriptyline (PAMELOR) 10 MG capsule Take 2 capsules (20 mg total) by mouth at bedtime. One po qhs xone week  180 capsule  1  . olopatadine (PATANOL) 0.1 % ophthalmic solution Place 1 drop into both eyes 2 (two) times daily as needed.       . ondansetron (ZOFRAN) 4 MG tablet Take 1 tablet (4 mg total) by mouth every 8 (eight) hours as needed for nausea.  30 tablet  6  . potassium chloride SA (K-DUR,KLOR-CON) 20 MEQ tablet Take 20 mEq by mouth daily as needed. Takes with torsemide.      Marland Kitchen PREMARIN 0.3 MG tablet Take 0.3 mg by mouth daily.      . SUMAtriptan (IMITREX) 25 MG tablet Take 1 tablet (25 mg total) by mouth every 2 (two) hours as needed for migraine.  27 tablet  1  . torsemide (DEMADEX) 20 MG tablet Take 20 mg by mouth daily as needed. For edema.      Marland Kitchen zolpidem (AMBIEN) 10 MG tablet Take 10 mg by mouth daily.       . ciprofloxacin (CIPRO) 500 MG tablet    Sig: Take 500 mg by mouth 2 (two) times daily.  Marland Kitchen EPIPEN 2-PAK 0.3 MG/0.3ML SOAJ injection    Sig: 0.3 mg.  . traMADol (ULTRAM-ER) 100 MG 24 hr tablet    Sig: Take 100 mg by mouth daily.    PAST MEDICAL HISTORY: Past Medical History  Diagnosis Date  . Hypertension   . H/O hiatal hernia     surgery fixed  . Diabetes mellitus     borderline  . Arthritis   . Anxiety     panic attacks  . Asthma   . Hypothyroidism   . Heart murmur     MVP  . Blood transfusion     1973--with twins birth    PAST SURGICAL HISTORY: Past Surgical History  Procedure Laterality Date  . Cholecystectomy    . Back surgery    . Tonsillectomy    . Appendectomy    . Abdominal hysterectomy    . Bladder tack    . Breast cyst incision and drainage    . Breast surgery    . Ovarian cyst surgery    . Nasal septum surgery    . Fracture surgery      left wrist  . Nissan fundoplication    . Bunionectomy    . Lumbar laminectomy  03/04/2012    Procedure: MICRODISCECTOMY LUMBAR LAMINECTOMY;   Surgeon: Jessy Oto, MD;  Location: [facility];  Service: Orthopedics;  Laterality: N/A;  L3-4 central laminectomy    FAMILY HISTORY: History reviewed. No pertinent family history.  SOCIAL HISTORY: History   Social History  . Marital Status: Widowed    Spouse Name: N/A    Number of Children: 3  . Years of Education: college   Occupational History  . retired     Retired   Social History Main Topics  . Smoking status:  Never Smoker   . Smokeless tobacco: Never Used  . Alcohol Use: No  . Drug Use: No  . Sexual Activity: No   Other Topics Concern  . Not on file   Social History Narrative   Patient has a high school education.    Patient is widow.   Patient is retired.     PHYSICAL EXAM  Filed Vitals:   07/06/13 0939  BP: 138/76  Pulse: 84  Temp: 97.9 F (36.6 C)  TempSrc: Oral  Height: 5\' 5"  (1.651 m)  Weight: 220 lb (99.791 kg)   Body mass index is 36.61 kg/(m^2).  Generalized: In no acute distress, pleasant AA female, obese Neck: Supple, no carotid bruits  Cardiac: Regular rate rhythm  Pulmonary: Clear to auscultation bilaterally  Musculoskeletal: No deformity Head:  temporal arteries flat and non-pulsatile bilaterally   Neurological examination  Mentation: Alert oriented to time, place, history taking, and causual conversation, depressed looking middle-aged female,  Cranial nerve II-XII: Pupils were equal round reactive to light extraocular movements were full, visual field were full on confrontational test. facial sensation and strength were normal. hearing was intact to finger rubbing bilaterally. Uvula tongue midline. head turning and shoulder shrug and were normal and symmetric.Tongue protrusion into cheek strength was normal.  Motor: normal tone, bulk and strength.  Sensory: Intact to fine touch, pinprick, preserved vibratory sensation, and proprioception at toes.  Coordination: Normal finger to nose, heel-to-shin bilaterally there was no truncal ataxia    Gait: Rising up from seated position pushing on chair arm, bilateral lower extremity tremor, cautious gait.  Romberg signs: Negative  Deep tendon reflexes: Brachioradialis 3/3, biceps 3/3, triceps 3/3, patellar 3/3, Achilles 2/2, plantar responses were flexor bilaterally.   DIAGNOSTIC DATA (LABS, IMAGING, TESTING) - I reviewed patient records, labs, notes, testing and imaging myself where available.  Lab Results  Component Value Date   WBC 8.2 03/15/2013   HGB 13.6 03/15/2013   HCT 41.1 03/15/2013   MCV 88.8 03/15/2013   PLT 263 03/15/2013      Component Value Date/Time   NA 136 03/15/2013 2029   K 4.1 03/15/2013 2029   CL 102 03/15/2013 2029   CO2 24 03/15/2013 2029   GLUCOSE 119* 03/15/2013 2029   BUN 11 03/15/2013 2029   CREATININE 1.31* 03/15/2013 2029   CALCIUM 9.2 03/15/2013 2029   PROT 6.9 03/15/2013 2029   ALBUMIN 3.0* 03/15/2013 2029   AST 113* 03/15/2013 2029   ALT 72* 03/15/2013 2029   ALKPHOS 155* 03/15/2013 2029   BILITOT 0.5 03/15/2013 2029   GFRNONAA 42* 03/15/2013 2029   GFRAA 49* 03/15/2013 2029    04/21/2013  MRI CERVICAL SPINE Abnormal MRI scan of cervical spine showing prominent spondylitic changes most prominent at C5-6 and C6-7 where there is a broad-based disc osteophyte complexes resulting in mild canal and moderate left greater than right foraminal narrowing. Overall no significant changes compared with previous MRI scan dated 07/01/2012.  ASSESSMENT AND PLAN 67 years old right-handed African American female with concussion June 2014, now complains of constant headaches, gait difficulty, she has hyperreflexia on examination.  1. Check Sed rate, and CRP today to evaluate for temporal arteritis (right) 2. Continue physical therapy for balance and gait 3. Discontinue Nortiptyline due to memory loss, blurred vision, not helping headaches. 4. Use Tramadol you have for acute headache, Continue Imitrex for abortive treatment 5. Follow up in 6 months, sooner as needed.  Philmore Pali, MSN, NP-C  07/06/2013, 9:56 AM [facility]  [address], [place] [place], [place] [address] [phone]

## 2013-07-06 NOTE — Patient Instructions (Addendum)
PLAN:  1. Check labs---Sed rate, and CRP today.  This is to check for Temporal Arteritis.  We will call you with results. 2. Continue physical therapy for balance and gait 3. Discontinue Nortiptyline due to memory loss, blurred vision, not helping headaches. 4. Use Tramadol you have for acute headache, Continue Imitrex for abortive treatment 5. Follow up in 6 months, sooner as needed.

## 2013-07-11 ENCOUNTER — Ambulatory Visit: Payer: Medicare Other | Admitting: Physical Therapy

## 2013-07-11 DIAGNOSIS — N281 Cyst of kidney, acquired: Secondary | ICD-10-CM | POA: Diagnosis not present

## 2013-07-11 DIAGNOSIS — R748 Abnormal levels of other serum enzymes: Secondary | ICD-10-CM | POA: Diagnosis not present

## 2013-07-11 DIAGNOSIS — R7401 Elevation of levels of liver transaminase levels: Secondary | ICD-10-CM | POA: Diagnosis not present

## 2013-07-11 DIAGNOSIS — M7020 Olecranon bursitis, unspecified elbow: Secondary | ICD-10-CM | POA: Diagnosis not present

## 2013-07-11 DIAGNOSIS — R7402 Elevation of levels of lactic acid dehydrogenase (LDH): Secondary | ICD-10-CM | POA: Diagnosis not present

## 2013-07-11 DIAGNOSIS — Z6838 Body mass index (BMI) 38.0-38.9, adult: Secondary | ICD-10-CM | POA: Diagnosis not present

## 2013-07-13 ENCOUNTER — Ambulatory Visit: Payer: Medicare Other | Attending: Neurology | Admitting: Physical Therapy

## 2013-07-13 DIAGNOSIS — IMO0002 Reserved for concepts with insufficient information to code with codable children: Secondary | ICD-10-CM | POA: Diagnosis not present

## 2013-07-13 DIAGNOSIS — IMO0001 Reserved for inherently not codable concepts without codable children: Secondary | ICD-10-CM | POA: Insufficient documentation

## 2013-07-13 DIAGNOSIS — M545 Low back pain, unspecified: Secondary | ICD-10-CM | POA: Diagnosis not present

## 2013-07-13 DIAGNOSIS — M542 Cervicalgia: Secondary | ICD-10-CM | POA: Insufficient documentation

## 2013-07-13 DIAGNOSIS — R262 Difficulty in walking, not elsewhere classified: Secondary | ICD-10-CM | POA: Diagnosis not present

## 2013-07-13 DIAGNOSIS — M48061 Spinal stenosis, lumbar region without neurogenic claudication: Secondary | ICD-10-CM | POA: Diagnosis not present

## 2013-07-13 DIAGNOSIS — R51 Headache: Secondary | ICD-10-CM | POA: Diagnosis not present

## 2013-07-13 LAB — SEDIMENTATION RATE: Sed Rate: 12 mm/hr (ref 0–40)

## 2013-07-13 LAB — C-REACTIVE PROTEIN: CRP: 4.1 mg/L (ref 0.0–4.9)

## 2013-07-17 NOTE — Progress Notes (Signed)
Quick Note:  Spoke with patient and relayed results of blood work. The patient was also reminded of any future appointments. Patient understood and had no questions.  ______

## 2013-07-18 ENCOUNTER — Ambulatory Visit: Payer: Medicare Other | Admitting: Physical Therapy

## 2013-07-20 ENCOUNTER — Ambulatory Visit: Payer: Medicare Other | Admitting: Physical Therapy

## 2013-07-25 ENCOUNTER — Ambulatory Visit: Payer: Medicare Other | Admitting: Physical Therapy

## 2013-07-27 ENCOUNTER — Ambulatory Visit: Payer: Medicare Other | Admitting: Physical Therapy

## 2013-08-01 ENCOUNTER — Ambulatory Visit: Payer: Medicare Other | Admitting: Physical Therapy

## 2013-08-03 ENCOUNTER — Ambulatory Visit: Payer: Medicare Other | Admitting: Physical Therapy

## 2013-08-03 DIAGNOSIS — M47812 Spondylosis without myelopathy or radiculopathy, cervical region: Secondary | ICD-10-CM | POA: Diagnosis not present

## 2013-08-03 DIAGNOSIS — D649 Anemia, unspecified: Secondary | ICD-10-CM | POA: Diagnosis not present

## 2013-08-03 DIAGNOSIS — Z23 Encounter for immunization: Secondary | ICD-10-CM | POA: Diagnosis not present

## 2013-08-03 DIAGNOSIS — M48061 Spinal stenosis, lumbar region without neurogenic claudication: Secondary | ICD-10-CM | POA: Diagnosis not present

## 2013-08-03 DIAGNOSIS — R945 Abnormal results of liver function studies: Secondary | ICD-10-CM | POA: Diagnosis not present

## 2013-08-11 DIAGNOSIS — R945 Abnormal results of liver function studies: Secondary | ICD-10-CM | POA: Diagnosis not present

## 2013-08-14 DIAGNOSIS — R945 Abnormal results of liver function studies: Secondary | ICD-10-CM | POA: Diagnosis not present

## 2013-08-14 DIAGNOSIS — Z6837 Body mass index (BMI) 37.0-37.9, adult: Secondary | ICD-10-CM | POA: Diagnosis not present

## 2013-08-17 ENCOUNTER — Other Ambulatory Visit: Payer: Self-pay

## 2013-08-28 ENCOUNTER — Telehealth: Payer: Self-pay | Admitting: Neurology

## 2013-08-29 NOTE — Telephone Encounter (Signed)
Please advise 

## 2013-08-29 NOTE — Telephone Encounter (Signed)
The phone number entered to call is invalid.  It is for a sweepstakes.  I show we sent the Rx for #27, which is typically a 90 day Rx.  Ins will not pay for #27 per month, as that is above the FDA recommended dose, #15 is likely the max quantity they will pay for, which could be why they are trying to dispense #15.  I called the patient back to clarify her question and see if she has a different number we need to call to verify the Rx.  Got no answer, no VM.  The phone rang over 20 times.  I called the cell number, got no answer.  Left message.

## 2013-09-28 DIAGNOSIS — M48061 Spinal stenosis, lumbar region without neurogenic claudication: Secondary | ICD-10-CM | POA: Diagnosis not present

## 2013-09-28 DIAGNOSIS — Q762 Congenital spondylolisthesis: Secondary | ICD-10-CM | POA: Diagnosis not present

## 2013-09-28 DIAGNOSIS — M545 Low back pain, unspecified: Secondary | ICD-10-CM | POA: Diagnosis not present

## 2013-09-28 DIAGNOSIS — M47817 Spondylosis without myelopathy or radiculopathy, lumbosacral region: Secondary | ICD-10-CM | POA: Diagnosis not present

## 2013-10-13 DIAGNOSIS — R51 Headache: Secondary | ICD-10-CM | POA: Diagnosis not present

## 2013-10-13 DIAGNOSIS — B1710 Acute hepatitis C without hepatic coma: Secondary | ICD-10-CM | POA: Diagnosis not present

## 2013-10-13 DIAGNOSIS — E669 Obesity, unspecified: Secondary | ICD-10-CM | POA: Diagnosis not present

## 2013-10-13 DIAGNOSIS — Z1331 Encounter for screening for depression: Secondary | ICD-10-CM | POA: Diagnosis not present

## 2013-10-13 DIAGNOSIS — I10 Essential (primary) hypertension: Secondary | ICD-10-CM | POA: Diagnosis not present

## 2013-10-13 DIAGNOSIS — E119 Type 2 diabetes mellitus without complications: Secondary | ICD-10-CM | POA: Diagnosis not present

## 2013-10-13 DIAGNOSIS — E039 Hypothyroidism, unspecified: Secondary | ICD-10-CM | POA: Diagnosis not present

## 2013-10-13 DIAGNOSIS — G609 Hereditary and idiopathic neuropathy, unspecified: Secondary | ICD-10-CM | POA: Diagnosis not present

## 2013-10-13 DIAGNOSIS — N183 Chronic kidney disease, stage 3 unspecified: Secondary | ICD-10-CM | POA: Diagnosis not present

## 2013-10-17 DIAGNOSIS — M545 Low back pain, unspecified: Secondary | ICD-10-CM | POA: Diagnosis not present

## 2013-10-17 DIAGNOSIS — Q762 Congenital spondylolisthesis: Secondary | ICD-10-CM | POA: Diagnosis not present

## 2013-10-17 DIAGNOSIS — M47817 Spondylosis without myelopathy or radiculopathy, lumbosacral region: Secondary | ICD-10-CM | POA: Diagnosis not present

## 2013-11-06 DIAGNOSIS — M961 Postlaminectomy syndrome, not elsewhere classified: Secondary | ICD-10-CM | POA: Diagnosis not present

## 2013-11-06 DIAGNOSIS — Q762 Congenital spondylolisthesis: Secondary | ICD-10-CM | POA: Diagnosis not present

## 2013-11-06 DIAGNOSIS — M545 Low back pain, unspecified: Secondary | ICD-10-CM | POA: Diagnosis not present

## 2013-11-20 DIAGNOSIS — M545 Low back pain, unspecified: Secondary | ICD-10-CM | POA: Diagnosis not present

## 2013-11-20 DIAGNOSIS — M47817 Spondylosis without myelopathy or radiculopathy, lumbosacral region: Secondary | ICD-10-CM | POA: Diagnosis not present

## 2013-11-20 DIAGNOSIS — M961 Postlaminectomy syndrome, not elsewhere classified: Secondary | ICD-10-CM | POA: Diagnosis not present

## 2013-11-20 DIAGNOSIS — Q762 Congenital spondylolisthesis: Secondary | ICD-10-CM | POA: Diagnosis not present

## 2013-11-21 DIAGNOSIS — H5200 Hypermetropia, unspecified eye: Secondary | ICD-10-CM | POA: Diagnosis not present

## 2013-11-21 DIAGNOSIS — H52229 Regular astigmatism, unspecified eye: Secondary | ICD-10-CM | POA: Diagnosis not present

## 2013-11-21 DIAGNOSIS — H25039 Anterior subcapsular polar age-related cataract, unspecified eye: Secondary | ICD-10-CM | POA: Diagnosis not present

## 2013-11-21 DIAGNOSIS — H524 Presbyopia: Secondary | ICD-10-CM | POA: Diagnosis not present

## 2013-12-12 DIAGNOSIS — B182 Chronic viral hepatitis C: Secondary | ICD-10-CM | POA: Diagnosis not present

## 2013-12-13 DIAGNOSIS — I708 Atherosclerosis of other arteries: Secondary | ICD-10-CM | POA: Diagnosis not present

## 2013-12-13 DIAGNOSIS — H524 Presbyopia: Secondary | ICD-10-CM | POA: Diagnosis not present

## 2013-12-13 DIAGNOSIS — H2510 Age-related nuclear cataract, unspecified eye: Secondary | ICD-10-CM | POA: Diagnosis not present

## 2013-12-13 DIAGNOSIS — H25019 Cortical age-related cataract, unspecified eye: Secondary | ICD-10-CM | POA: Diagnosis not present

## 2013-12-13 DIAGNOSIS — H35039 Hypertensive retinopathy, unspecified eye: Secondary | ICD-10-CM | POA: Diagnosis not present

## 2013-12-13 DIAGNOSIS — E1039 Type 1 diabetes mellitus with other diabetic ophthalmic complication: Secondary | ICD-10-CM | POA: Diagnosis not present

## 2014-01-02 DIAGNOSIS — H269 Unspecified cataract: Secondary | ICD-10-CM | POA: Diagnosis not present

## 2014-01-02 DIAGNOSIS — H2510 Age-related nuclear cataract, unspecified eye: Secondary | ICD-10-CM | POA: Diagnosis not present

## 2014-01-04 ENCOUNTER — Encounter: Payer: Self-pay | Admitting: Nurse Practitioner

## 2014-01-04 ENCOUNTER — Ambulatory Visit (INDEPENDENT_AMBULATORY_CARE_PROVIDER_SITE_OTHER): Payer: Medicare Other | Admitting: Nurse Practitioner

## 2014-01-04 VITALS — BP 132/74 | HR 82 | Ht 65.0 in | Wt 220.0 lb

## 2014-01-04 DIAGNOSIS — F0781 Postconcussional syndrome: Secondary | ICD-10-CM

## 2014-01-04 DIAGNOSIS — R519 Headache, unspecified: Secondary | ICD-10-CM

## 2014-01-04 DIAGNOSIS — R51 Headache: Secondary | ICD-10-CM

## 2014-01-04 MED ORDER — SUMATRIPTAN SUCCINATE 50 MG PO TABS: 50.0000 mg | ORAL_TABLET | ORAL | Status: DC | PRN

## 2014-01-04 NOTE — Progress Notes (Signed)
PATIENT: Becky Hobbs DOB: [birth year]  REASON FOR VISIT: follow up for headches HISTORY FROM: patient  HISTORY OF PRESENT ILLNESS: 04/12/13 (YY): Man is a 68 years old right-handed African American female, referred by her primary care physician Dr. Marton Redwood for evaluation of headaches, concussion, and gait difficulty.  She had past medical history of hypertension, diabetes, hypothyroidism, in May 12th 2014, she came downstairs, stepped into a puddle of water, fell backwards, landed on her occipital area, there was no loss of consciousness, she could go on cleaning the floor, it was a leakage from 3 gallon water container, she has no difficulty completing the task, afterwards, she drove to her nephew's house, by then she was noticed to stumbled over, couple days later, she began to have frequent headaches, vertex bilateral retro-orbital area severe pounding headaches with associated light noise sensitivity, she also complains of constant gait difficulty, bilateral lower extremity tremorish sensation when bearing weight,  She also has difficulty sleeping, her headache are pouding, movements make it worse, with associated light noise sensitivity, she denies visual loss, she complains of neck pain, radiating pain to her bilateral shoulder. She denies bowel and bladder incontinence  CT head without contrast in June 2014 showed no acute lesions,  MRI of cervical spine in September 2013 evaluating for gait difficulty, bilateral upper extremity paresthesia showed multilevel degenerative disc disease, most severe C5-6, C6 and 7, but no significant canal stenosis.   UPDATE 07/06/13 (LL): Becky Hobbs returns for follow up, 2 months since last visit. She states that headaches have not improved on Nortriptyline, and she thinks she has more memory problems and blurred vision since using it. On 04/21/13 scan of cervical spine showing prominent spondylitic changes most prominent at C5-6 and C6-7 where  there is a broad-based disc osteophyte complexes resulting in mild canal and moderate left greater than right foraminal narrowing. Overall no significant changes compared with previous MRI scan dated 07/01/2012. She recently started Tramadol XR for back pain. She is still attending twice weekly PT for gait and balance training with some improvement. She cannot take NSAIDS due to chronic renal insufficiency and she has reduced Tylenol due to recent elevation in liver enzymes; Dr. Brigitte Pulse is following. Sumatriptan helps with severe headache, but she has frequent right temporal pain that is throbbing at times. She reports throbbing quality at times and pain with chewing.   UPDATE 01/04/14 (LL):  Becky Hobbs returns for 6 month follow up for post-concussion syndrome.  She reports near daily headaches still.  She has been seeing Dr. Maia Petties at [facility] for her back pain.  She still has significant balance problems and gait difficulty.   REVIEW OF SYSTEMS: Full 14 system review of systems performed and notable only for: eye discharge, eye itching, light sensitivity, double vision, excessive thirst, Insomnia, daytime sleepiness, env allergies, joint pain, joint swelling, back pain, aching muscles, muscle cramps, walking difficulty, coordination problems, memory loss, headache   ALLERGIES: Allergies  Allergen Reactions  . Morphine And Related Itching and Other (See Comments)    Hives (moderate severity)  . Erythromycin Hives and Itching  . Hydrocodone Itching  . Penicillins Hives    HOME MEDICATIONS: Outpatient Prescriptions Prior to Visit  Medication Sig Dispense Refill  . allopurinol (ZYLOPRIM) 100 MG tablet Take 100 mg by mouth daily.      Marland Kitchen ALPRAZolam (XANAX) 0.5 MG tablet Take 0.5 mg by mouth daily.      . cetirizine (ZYRTEC) 10 MG  tablet Take 10 mg by mouth daily.      . Cholecalciferol 2000 UNITS CAPS Take 4,000 Units by mouth daily.      Marland Kitchen EPIPEN 2-PAK 0.3 MG/0.3ML SOAJ  injection 0.3 mg.      . gabapentin (NEURONTIN) 300 MG capsule Take 300-600 mg by mouth 3 (three) times daily. Takes 1 in am and noon, and 2 in pm.      . levothyroxine (SYNTHROID, LEVOTHROID) 75 MCG tablet Take 75 mcg by mouth daily.      . metoprolol succinate (TOPROL-XL) 50 MG 24 hr tablet Take 100 mg by mouth daily. Take with or immediately following a meal.      . Multiple Vitamin (MULITIVITAMIN WITH MINERALS) TABS Take 1 tablet by mouth daily.      Marland Kitchen olopatadine (PATANOL) 0.1 % ophthalmic solution Place 1 drop into both eyes 2 (two) times daily as needed.       . ondansetron (ZOFRAN) 4 MG tablet Take 1 tablet (4 mg total) by mouth every 8 (eight) hours as needed for nausea.  30 tablet  6  . potassium chloride SA (K-DUR,KLOR-CON) 20 MEQ tablet Take 20 mEq by mouth daily as needed. Takes with torsemide.      Marland Kitchen PREMARIN 0.3 MG tablet Take 0.3 mg by mouth daily.      Marland Kitchen QSYMIA 7.5-46 MG CP24 7.5 mg daily.      Marland Kitchen torsemide (DEMADEX) 20 MG tablet Take 20 mg by mouth daily as needed. For edema.      Marland Kitchen zolpidem (AMBIEN) 10 MG tablet Take 10 mg by mouth daily.      Marland Kitchen amLODipine (NORVASC) 5 MG tablet Take 5 mg by mouth daily.      . ciprofloxacin (CIPRO) 500 MG tablet Take 500 mg by mouth 2 (two) times daily.      Javier Docker Oil 500 MG CAPS Take 500 mg by mouth daily.      . SUMAtriptan (IMITREX) 25 MG tablet Take 1 tablet (25 mg total) by mouth every 2 (two) hours as needed for migraine.  27 tablet  1  . traMADol (ULTRAM-ER) 100 MG 24 hr tablet Take 100 mg by mouth daily.       No facility-administered medications prior to visit.     PHYSICAL EXAM  Filed Vitals:   01/04/14 1405  BP: 132/74  Pulse: 82  Height: 5\' 5"  (1.651 m)  Weight: 220 lb (99.791 kg)   Body mass index is 36.61 kg/(m^2).  Generalized: In no acute distress, pleasant AA female, obese  Neck: Supple, no carotid bruits  Cardiac: Regular rate rhythm  Pulmonary: Clear to auscultation bilaterally  Musculoskeletal: No deformity   Head: temporal arteries flat and non-pulsatile bilaterally   Neurological examination  Mentation: Alert oriented to time, place, history taking, and casual conversation, depressed looking middle-aged female Cranial nerve II-XII: Pupils were equal round reactive to light extraocular movements were full, visual field were full on confrontational test. facial sensation and strength were normal. hearing was intact to finger rubbing bilaterally. Uvula tongue midline. head turning and shoulder shrug and were normal and symmetric.Tongue protrusion into cheek strength was normal.  Motor: normal tone, bulk and strength.  Sensory: Intact to fine touch, pinprick,  Coordination: Normal finger to nose, heel-to-shin bilaterally there was no truncal ataxia  Gait: Rising up from seated position pushing on chair arm, bilateral lower extremity tremor, cautious gait.  Romberg signs: Negative  Deep tendon reflexes: Brachioradialis 3/3, biceps 3/3, triceps 3/3, patellar 3/3,  Achilles 2/2 DIAGNOSTIC DATA (LABS, IMAGING, TESTING) - I reviewed patient records, labs, notes, testing and imaging myself where available.  Lab Results  Component Value Date   ESRSEDRATE 12 07/06/2013        CRP  4.1      07/06/2013  ASSESSMENT AND PLAN 68 years old right-handed African American female with history of back pain, lumbar spinal stenosis, and concussion June 2014, now complains of constant headaches, gait difficulty; she has hyperreflexia on examination.   PLAN: Continue Tramadol if you have for acute headache, Continue Imitrex for abortive treatment  Follow up in 6 months, with Dr. Krista Blue, sooner as needed.   No orders of the defined types were placed in this encounter.    Meds ordered this encounter  Medications  . SUMAtriptan (IMITREX) 50 MG tablet    Sig: Take 1 tablet (50 mg total) by mouth every 2 (two) hours as needed for migraine.    Dispense:  15 tablet    Refill:  5    Order Specific Question:  Supervising  Provider    Answer:  Penni Bombard [[id number]]   Return in about 6 months (around 07/07/2014).  Philmore Pali, MSN, NP-C 01/04/2014, 7:50 PM [facility] [address], [place], [place] [address] [phone]  Note: This document was prepared with digital dictation and possible smart phrase technology. Any transcriptional errors that result from this process are unintentional.

## 2014-01-04 NOTE — Patient Instructions (Addendum)
PLAN: Continue Imitrex for abortive treatment  I have sent a new prescription to the pharmacy for 15 tablets. Try to get some regular light exercise, water exercises would help your back and balance. Follow up in 6 months, with Dr. Krista Blue, sooner as needed.

## 2014-01-10 DIAGNOSIS — D509 Iron deficiency anemia, unspecified: Secondary | ICD-10-CM | POA: Diagnosis not present

## 2014-01-10 DIAGNOSIS — N183 Chronic kidney disease, stage 3 unspecified: Secondary | ICD-10-CM | POA: Diagnosis not present

## 2014-01-10 DIAGNOSIS — E669 Obesity, unspecified: Secondary | ICD-10-CM | POA: Diagnosis not present

## 2014-01-10 DIAGNOSIS — I10 Essential (primary) hypertension: Secondary | ICD-10-CM | POA: Diagnosis not present

## 2014-01-11 DIAGNOSIS — H5200 Hypermetropia, unspecified eye: Secondary | ICD-10-CM | POA: Diagnosis not present

## 2014-01-11 DIAGNOSIS — H25039 Anterior subcapsular polar age-related cataract, unspecified eye: Secondary | ICD-10-CM | POA: Diagnosis not present

## 2014-01-11 DIAGNOSIS — H524 Presbyopia: Secondary | ICD-10-CM | POA: Diagnosis not present

## 2014-01-11 DIAGNOSIS — H52229 Regular astigmatism, unspecified eye: Secondary | ICD-10-CM | POA: Diagnosis not present

## 2014-01-23 DIAGNOSIS — R7989 Other specified abnormal findings of blood chemistry: Secondary | ICD-10-CM | POA: Diagnosis not present

## 2014-01-25 DIAGNOSIS — B182 Chronic viral hepatitis C: Secondary | ICD-10-CM | POA: Diagnosis not present

## 2014-01-26 DIAGNOSIS — M545 Low back pain, unspecified: Secondary | ICD-10-CM | POA: Diagnosis not present

## 2014-01-26 DIAGNOSIS — Q762 Congenital spondylolisthesis: Secondary | ICD-10-CM | POA: Diagnosis not present

## 2014-01-26 DIAGNOSIS — M47817 Spondylosis without myelopathy or radiculopathy, lumbosacral region: Secondary | ICD-10-CM | POA: Diagnosis not present

## 2014-01-26 DIAGNOSIS — M961 Postlaminectomy syndrome, not elsewhere classified: Secondary | ICD-10-CM | POA: Diagnosis not present

## 2014-01-30 NOTE — Progress Notes (Signed)
I reviewed note and agree with plan.   Penni Bombard, MD [id number], [id number] PM Certified in Neurology, Neurophysiology and Neuroimaging  [facility] [address] [place], [place] [address] [phone]

## 2014-01-31 DIAGNOSIS — H25019 Cortical age-related cataract, unspecified eye: Secondary | ICD-10-CM | POA: Diagnosis not present

## 2014-01-31 DIAGNOSIS — H2510 Age-related nuclear cataract, unspecified eye: Secondary | ICD-10-CM | POA: Diagnosis not present

## 2014-02-13 DIAGNOSIS — E119 Type 2 diabetes mellitus without complications: Secondary | ICD-10-CM | POA: Diagnosis not present

## 2014-02-13 DIAGNOSIS — B1710 Acute hepatitis C without hepatic coma: Secondary | ICD-10-CM | POA: Diagnosis not present

## 2014-02-13 DIAGNOSIS — R609 Edema, unspecified: Secondary | ICD-10-CM | POA: Diagnosis not present

## 2014-02-13 DIAGNOSIS — M79609 Pain in unspecified limb: Secondary | ICD-10-CM | POA: Diagnosis not present

## 2014-02-13 DIAGNOSIS — Z6838 Body mass index (BMI) 38.0-38.9, adult: Secondary | ICD-10-CM | POA: Diagnosis not present

## 2014-02-13 DIAGNOSIS — B182 Chronic viral hepatitis C: Secondary | ICD-10-CM | POA: Diagnosis not present

## 2014-02-13 DIAGNOSIS — I10 Essential (primary) hypertension: Secondary | ICD-10-CM | POA: Diagnosis not present

## 2014-02-14 ENCOUNTER — Ambulatory Visit (HOSPITAL_COMMUNITY)
Admission: RE | Admit: 2014-02-14 | Discharge: 2014-02-14 | Disposition: A | Discharge status: Home / Self Care | Payer: Medicare Other | Source: Ambulatory Visit | Attending: Vascular Surgery | Admitting: Vascular Surgery

## 2014-02-14 ENCOUNTER — Other Ambulatory Visit (HOSPITAL_COMMUNITY): Payer: Self-pay | Admitting: Endocrinology

## 2014-02-14 DIAGNOSIS — M7989 Other specified soft tissue disorders: Secondary | ICD-10-CM | POA: Diagnosis not present

## 2014-02-14 DIAGNOSIS — M79609 Pain in unspecified limb: Secondary | ICD-10-CM | POA: Insufficient documentation

## 2014-02-14 DIAGNOSIS — Q762 Congenital spondylolisthesis: Secondary | ICD-10-CM | POA: Diagnosis not present

## 2014-02-14 DIAGNOSIS — M545 Low back pain, unspecified: Secondary | ICD-10-CM | POA: Diagnosis not present

## 2014-02-14 DIAGNOSIS — M961 Postlaminectomy syndrome, not elsewhere classified: Secondary | ICD-10-CM | POA: Diagnosis not present

## 2014-02-20 DIAGNOSIS — H269 Unspecified cataract: Secondary | ICD-10-CM | POA: Diagnosis not present

## 2014-02-20 DIAGNOSIS — H2510 Age-related nuclear cataract, unspecified eye: Secondary | ICD-10-CM | POA: Diagnosis not present

## 2014-03-16 DIAGNOSIS — M47817 Spondylosis without myelopathy or radiculopathy, lumbosacral region: Secondary | ICD-10-CM | POA: Diagnosis not present

## 2014-03-28 DIAGNOSIS — B182 Chronic viral hepatitis C: Secondary | ICD-10-CM | POA: Diagnosis not present

## 2014-04-02 DIAGNOSIS — B182 Chronic viral hepatitis C: Secondary | ICD-10-CM | POA: Diagnosis not present

## 2014-04-04 DIAGNOSIS — M25569 Pain in unspecified knee: Secondary | ICD-10-CM | POA: Diagnosis not present

## 2014-04-18 DIAGNOSIS — I10 Essential (primary) hypertension: Secondary | ICD-10-CM | POA: Diagnosis not present

## 2014-04-18 DIAGNOSIS — R82998 Other abnormal findings in urine: Secondary | ICD-10-CM | POA: Diagnosis not present

## 2014-04-18 DIAGNOSIS — E039 Hypothyroidism, unspecified: Secondary | ICD-10-CM | POA: Diagnosis not present

## 2014-04-18 DIAGNOSIS — E119 Type 2 diabetes mellitus without complications: Secondary | ICD-10-CM | POA: Diagnosis not present

## 2014-04-18 DIAGNOSIS — N390 Urinary tract infection, site not specified: Secondary | ICD-10-CM | POA: Diagnosis not present

## 2014-04-25 DIAGNOSIS — Z0000 Encounter for general adult medical examination without abnormal findings: Secondary | ICD-10-CM | POA: Diagnosis not present

## 2014-04-25 DIAGNOSIS — N183 Chronic kidney disease, stage 3 unspecified: Secondary | ICD-10-CM | POA: Diagnosis not present

## 2014-04-25 DIAGNOSIS — R609 Edema, unspecified: Secondary | ICD-10-CM | POA: Diagnosis not present

## 2014-04-25 DIAGNOSIS — R82998 Other abnormal findings in urine: Secondary | ICD-10-CM | POA: Diagnosis not present

## 2014-04-25 DIAGNOSIS — B1710 Acute hepatitis C without hepatic coma: Secondary | ICD-10-CM | POA: Diagnosis not present

## 2014-04-25 DIAGNOSIS — I10 Essential (primary) hypertension: Secondary | ICD-10-CM | POA: Diagnosis not present

## 2014-04-25 DIAGNOSIS — E039 Hypothyroidism, unspecified: Secondary | ICD-10-CM | POA: Diagnosis not present

## 2014-04-25 DIAGNOSIS — G609 Hereditary and idiopathic neuropathy, unspecified: Secondary | ICD-10-CM | POA: Diagnosis not present

## 2014-04-25 DIAGNOSIS — E119 Type 2 diabetes mellitus without complications: Secondary | ICD-10-CM | POA: Diagnosis not present

## 2014-05-09 DIAGNOSIS — IMO0002 Reserved for concepts with insufficient information to code with codable children: Secondary | ICD-10-CM | POA: Diagnosis not present

## 2014-05-09 DIAGNOSIS — M545 Low back pain, unspecified: Secondary | ICD-10-CM | POA: Diagnosis not present

## 2014-05-09 DIAGNOSIS — M961 Postlaminectomy syndrome, not elsewhere classified: Secondary | ICD-10-CM | POA: Diagnosis not present

## 2014-05-24 DIAGNOSIS — B182 Chronic viral hepatitis C: Secondary | ICD-10-CM | POA: Diagnosis not present

## 2014-06-11 DIAGNOSIS — K7460 Unspecified cirrhosis of liver: Secondary | ICD-10-CM | POA: Diagnosis not present

## 2014-06-11 DIAGNOSIS — B182 Chronic viral hepatitis C: Secondary | ICD-10-CM | POA: Diagnosis not present

## 2014-06-19 DIAGNOSIS — M545 Low back pain, unspecified: Secondary | ICD-10-CM | POA: Diagnosis not present

## 2014-07-10 ENCOUNTER — Ambulatory Visit (INDEPENDENT_AMBULATORY_CARE_PROVIDER_SITE_OTHER): Payer: Medicare Other | Admitting: Neurology

## 2014-07-10 VITALS — BP 120/80 | HR 78 | Ht 65.0 in | Wt 200.0 lb

## 2014-07-10 DIAGNOSIS — G43009 Migraine without aura, not intractable, without status migrainosus: Secondary | ICD-10-CM

## 2014-07-10 DIAGNOSIS — F0781 Postconcussional syndrome: Secondary | ICD-10-CM | POA: Diagnosis not present

## 2014-07-10 MED ORDER — SUMATRIPTAN SUCCINATE 50 MG PO TABS: 50.0000 mg | ORAL_TABLET | ORAL | Status: DC | PRN

## 2014-07-10 MED ORDER — TOPIRAMATE 100 MG PO TABS: ORAL_TABLET | ORAL | Status: DC

## 2014-07-10 NOTE — Progress Notes (Signed)
PATIENT: Becky Hobbs DOB: 1946/04/01  HISTORY OF PRESENT ILLNESS:  Becky Hobbs is a 68 years old right-handed African American female, referred by her primary care physician Dr. Marton Redwood for evaluation of headaches, concussion, and gait difficulty, initial visit was in July 2014.   She had past medical history of hypertension, diabetes, hypothyroidism, in May 12th 2014, she came downstairs, stepped into a puddle of water, fell backwards, landed on her occipital area, there was no loss of consciousness, she could go on cleaning the floor, it was a leakage from 3 gallon water container, she has no difficulty completing the task, afterwards, she drove to her nephew's house, by then she was noticed to stumbled over, couple days later, she began to have frequent headaches, vertex bilateral retro-orbital area severe pounding headaches with associated light noise sensitivity, she also complains of constant gait difficulty, bilateral lower extremity tremorish sensation when bearing weight,   She also has difficulty sleeping, her headache are pouding, movements make it worse, with associated light noise sensitivity, she denies visual loss, she complains of neck pain, radiating pain to her bilateral shoulder. She denies bowel and bladder incontinence   CT head without contrast in June 2014 showed no acute lesions,  MRI of cervical spine in September 2013 evaluating for gait difficulty, bilateral upper extremity paresthesia showed multilevel degenerative disc disease, most severe C5-6, C6 and 7 with mild canal stenosis, mild to moderate left more than right foraminal stenosis.   04/21/13 scan of cervical spine showing prominent spondylitic changes most prominent at C5-6 and C6-7 where there is a broad-based disc osteophyte complexes resulting in mild canal and moderate left greater than right foraminal narrowing. Overall no significant changes compared with previous MRI scan dated 07/01/2012.     UPDATE  Sept 29th 2015:  Her headache overall has much improved, instead of daily headaches, she is having headaches 2-3 times each week, starting at the right parietal area, pounding, with associated light noise sensitivity, nauseous, movements make it worse, she preferred to lie down in dark quiet room, lasting half days,  She did have a history of migraine headaches in the past  Trigger for her headaches are stress, sleep deprivation, insomnia, she continued having low back pain, has been receving epidural injections by Dr. Maia Petties  REVIEW OF SYSTEMS: Full 14 system review of systems performed and notable only for: Fatigue, diarrhea, blurry vision, back pain, achy muscles, walking difficulty, neck stiffness, headaches,   ALLERGIES: Allergies  Allergen Reactions  . Morphine And Related Itching and Other (See Comments)    Hives (moderate severity)  . Erythromycin Hives and Itching  . Hydrocodone Itching  . Penicillins Hives  . Tramadol Hcl Itching    HOME MEDICATIONS: Outpatient Prescriptions Prior to Visit  Medication Sig Dispense Refill  . allopurinol (ZYLOPRIM) 100 MG tablet Take 100 mg by mouth daily.      Marland Kitchen ALPRAZolam (XANAX) 0.5 MG tablet Take 0.5 mg by mouth daily.      . cetirizine (ZYRTEC) 10 MG tablet Take 10 mg by mouth daily.      . Cholecalciferol 2000 UNITS CAPS Take 4,000 Units by mouth daily.      Marland Kitchen EPIPEN 2-PAK 0.3 MG/0.3ML SOAJ injection 0.3 mg.      . gabapentin (NEURONTIN) 300 MG capsule Take 300-600 mg by mouth 3 (three) times daily. Takes 1 in am and noon, and 2 in pm.      . levothyroxine (SYNTHROID, LEVOTHROID) 75 MCG tablet Take 75 mcg  by mouth daily.      . metoprolol succinate (TOPROL-XL) 50 MG 24 hr tablet Take 100 mg by mouth daily. Take with or immediately following a meal.      . Multiple Vitamin (MULITIVITAMIN WITH MINERALS) TABS Take 1 tablet by mouth daily.      Marland Kitchen olopatadine (PATANOL) 0.1 % ophthalmic solution Place 1 drop into both eyes 2 (two) times  daily as needed.       . ondansetron (ZOFRAN) 4 MG tablet Take 1 tablet (4 mg total) by mouth every 8 (eight) hours as needed for nausea.  30 tablet  6  . potassium chloride SA (K-DUR,KLOR-CON) 20 MEQ tablet Take 20 mEq by mouth daily as needed. Takes with torsemide.      Marland Kitchen PREMARIN 0.3 MG tablet Take 0.3 mg by mouth daily.      Marland Kitchen QSYMIA 7.5-46 MG CP24 7.5 mg daily.      . SUMAtriptan (IMITREX) 50 MG tablet Take 1 tablet (50 mg total) by mouth every 2 (two) hours as needed for migraine.  15 tablet  5  . torsemide (DEMADEX) 20 MG tablet Take 20 mg by mouth daily as needed. For edema.      Marland Kitchen zolpidem (AMBIEN) 10 MG tablet Take 10 mg by mouth daily.       No facility-administered medications prior to visit.     PHYSICAL EXAM  There were no vitals filed for this visit. There is no weight on file to calculate BMI.  Generalized: In no acute distress, pleasant AA female, obese  Neck: Supple, no carotid bruits  Cardiac: Regular rate rhythm  Pulmonary: Clear to auscultation bilaterally  Musculoskeletal: No deformity  Head: temporal arteries flat and non-pulsatile bilaterally   Neurological examination  Mentation: Alert oriented to time, place, history taking, and casual conversation, depressed looking middle-aged female Cranial nerve II-XII: Pupils were equal round reactive to light extraocular movements were full, visual field were full on confrontational test. facial sensation and strength were normal. hearing was intact to finger rubbing bilaterally. Uvula tongue midline. head turning and shoulder shrug and were normal and symmetric.Tongue protrusion into cheek strength was normal.  Motor: normal tone, bulk and strength.  Sensory: Intact to fine touch, pinprick,  Coordination: Normal finger to nose, heel-to-shin bilaterally there was no truncal ataxia  Gait: Rising up from seated position pushing on chair arm, bilateral lower extremity tremor, cautious gait.  Romberg signs: Negative  Deep  tendon reflexes: Brachioradialis 3/3, biceps 3/3, triceps 3/3, patellar 3/3, Achilles 2/2, plantar responses were flexor  DIAGNOSTIC DATA (LABS, IMAGING, TESTING) - I reviewed patient records, labs, notes, testing and imaging myself where available.  Lab Results  Component Value Date   ESRSEDRATE 12 07/06/2013        CRP  4.1      07/06/2013  ASSESSMENT AND PLAN 68 years old right-handed African American female with history of back pain, lumbar spinal stenosis, and concussion June 2014, now complains of constant headaches, gait difficulty; she has hyperreflexia on examination.   1, her headache has migraine features, overall has improved, 2. I will start her on preventive medications Topamax 100 mg twice a day, Imitrex 50 mg as needed 3. Return to clinic in 3 months   [facility] [address], [place], [place] [address] [phone]

## 2014-07-13 ENCOUNTER — Encounter: Payer: Self-pay | Admitting: Neurology

## 2014-07-13 DIAGNOSIS — Z6835 Body mass index (BMI) 35.0-35.9, adult: Secondary | ICD-10-CM | POA: Diagnosis not present

## 2014-07-13 DIAGNOSIS — N390 Urinary tract infection, site not specified: Secondary | ICD-10-CM | POA: Diagnosis not present

## 2014-07-13 DIAGNOSIS — B372 Candidiasis of skin and nail: Secondary | ICD-10-CM | POA: Diagnosis not present

## 2014-08-02 DIAGNOSIS — M47817 Spondylosis without myelopathy or radiculopathy, lumbosacral region: Secondary | ICD-10-CM | POA: Diagnosis not present

## 2014-08-02 DIAGNOSIS — M961 Postlaminectomy syndrome, not elsewhere classified: Secondary | ICD-10-CM | POA: Diagnosis not present

## 2014-08-02 DIAGNOSIS — M545 Low back pain: Secondary | ICD-10-CM | POA: Diagnosis not present

## 2014-08-15 DIAGNOSIS — B182 Chronic viral hepatitis C: Secondary | ICD-10-CM | POA: Diagnosis not present

## 2014-08-16 ENCOUNTER — Encounter: Payer: Self-pay | Admitting: *Deleted

## 2014-08-23 DIAGNOSIS — M5137 Other intervertebral disc degeneration, lumbosacral region: Secondary | ICD-10-CM | POA: Diagnosis not present

## 2014-08-23 DIAGNOSIS — M545 Low back pain: Secondary | ICD-10-CM | POA: Diagnosis not present

## 2014-08-30 DIAGNOSIS — M961 Postlaminectomy syndrome, not elsewhere classified: Secondary | ICD-10-CM | POA: Diagnosis not present

## 2014-08-30 DIAGNOSIS — M4316 Spondylolisthesis, lumbar region: Secondary | ICD-10-CM | POA: Diagnosis not present

## 2014-08-30 DIAGNOSIS — M545 Low back pain: Secondary | ICD-10-CM | POA: Diagnosis not present

## 2014-09-04 DIAGNOSIS — M4806 Spinal stenosis, lumbar region: Secondary | ICD-10-CM | POA: Diagnosis not present

## 2014-09-20 DIAGNOSIS — M545 Low back pain: Secondary | ICD-10-CM | POA: Diagnosis not present

## 2014-09-20 DIAGNOSIS — M47817 Spondylosis without myelopathy or radiculopathy, lumbosacral region: Secondary | ICD-10-CM | POA: Diagnosis not present

## 2014-09-20 DIAGNOSIS — M961 Postlaminectomy syndrome, not elsewhere classified: Secondary | ICD-10-CM | POA: Diagnosis not present

## 2014-09-20 DIAGNOSIS — M4316 Spondylolisthesis, lumbar region: Secondary | ICD-10-CM | POA: Diagnosis not present

## 2014-10-01 DIAGNOSIS — M5416 Radiculopathy, lumbar region: Secondary | ICD-10-CM | POA: Diagnosis not present

## 2014-10-09 ENCOUNTER — Encounter: Payer: Self-pay | Admitting: Neurology

## 2014-10-09 ENCOUNTER — Ambulatory Visit (INDEPENDENT_AMBULATORY_CARE_PROVIDER_SITE_OTHER): Payer: Medicare Other | Admitting: Neurology

## 2014-10-09 VITALS — BP 110/65 | HR 75 | Temp 97.9°F | Ht 65.0 in | Wt 201.0 lb

## 2014-10-09 DIAGNOSIS — G43909 Migraine, unspecified, not intractable, without status migrainosus: Secondary | ICD-10-CM

## 2014-10-09 MED ORDER — SUMATRIPTAN 20 MG/ACT NA SOLN: 20.0000 mg | NASAL | Status: DC | PRN

## 2014-10-09 MED ORDER — TOPIRAMATE 100 MG PO TABS: ORAL_TABLET | ORAL | Status: DC

## 2014-10-09 NOTE — Progress Notes (Signed)
[id number]    Provider:  Dr Becky Hobbs Referring Provider: Marton Redwood, MD Primary Care Physician:  Becky Redwood, MD  CC:  Migraines  Interval History 10/09/2014:  Becky Hobbs is a 68 y.o. female here as a referral from Dr. Brigitte Hobbs for Migraines  Migraines are better. She is having them once a week. She takes the triptan then they go away. She is having some blurry vision. Especially when she is straining her eyes on the computer. If she takes a break and rests it gets better. Doing well on the topamax 50mg  bid and up to 600mg  3x a day of neurontin for the back pain but she is going to have an operation. For acute management, she takes just one triptan. Then she sleeps and it goes away. Becky Hobbs for insomnia. Not having any problems.   Previous History Dr. Krista Hobbs:  Becky Hobbs is a 68 years old right-handed African American female, referred by her primary care physician Dr. Marton Hobbs for evaluation of headaches, concussion, and gait difficulty, initial visit was in July 2014.   She had past medical history of hypertension, diabetes, hypothyroidism, in May 12th 2014, she came downstairs, stepped into a puddle of water, fell backwards, landed on her occipital area, there was no loss of consciousness, she could go on cleaning the floor, it was a leakage from 3 gallon water container, she has no difficulty completing the task, afterwards, she drove to her nephew's house, by then she was noticed to stumbled over, couple days later, she began to have frequent headaches, vertex bilateral retro-orbital area severe pounding headaches with associated light noise sensitivity, she also complains of constant gait difficulty, bilateral lower extremity tremorish sensation when bearing weight,   She also has difficulty sleeping, her headache are pouding, movements make it worse, with associated light noise sensitivity, she denies visual loss, she complains of neck pain, radiating pain to  her bilateral shoulder. She denies bowel and bladder incontinence   CT head without contrast in June 2014 showed no acute lesions,  MRI of cervical spine in September 2013 evaluating for gait difficulty, bilateral upper extremity paresthesia showed multilevel degenerative disc disease, most severe C5-6, C6 and 7 with mild canal stenosis, mild to moderate left more than right foraminal stenosis.   04/21/13 scan of cervical spine showing prominent spondylitic changes most prominent at C5-6 and C6-7 where there is a broad-based disc osteophyte complexes resulting in mild canal and moderate left greater than right foraminal narrowing. Overall no significant changes compared with previous MRI scan dated 07/01/2012.    UPDATE Sept 29th 2015:  Her headache overall has much improved, instead of daily headaches, she is having headaches 2-3 times each week, starting at the right parietal area, pounding, with associated light noise sensitivity, nauseous, movements make it worse, she preferred to lie down in dark quiet room, lasting half days,  She did have a history of migraine headaches in the past  Trigger for her headaches are stress, sleep deprivation, insomnia, she continued having low back pain, has been receving epidural injections by Dr. Maia Hobbs  Review of Systems: Patient complains of symptoms per HPI as well as the following symptoms: runny nose, blurred vision, env allergies, insomnia, decr concentration. Pertinent negatives per HPI. All others negative.   History   Social History  . Marital Status: Widowed    Spouse Name: N/A    Number of Children: 3  . Years of Education: college   Occupational History  .  retired     Retired   Social History Main Topics  . Smoking status: Never Smoker   . Smokeless tobacco: Never Used  . Alcohol Use: No  . Drug Use: No  . Sexual Activity: No   Other Topics Concern  . Not on file   Social History Narrative   Patient has a high school  education.    Patient is widow.   Patient is retired.    History reviewed. No pertinent family history.  Past Medical History  Diagnosis Date  . Hypertension   . H/O hiatal hernia     surgery fixed  . Diabetes mellitus     borderline  . Arthritis   . Anxiety     panic attacks  . Asthma   . Hypothyroidism   . Heart murmur     MVP  . Blood transfusion     1973--with twins birth    Past Surgical History  Procedure Laterality Date  . Cholecystectomy    . Back surgery    . Tonsillectomy    . Appendectomy    . Abdominal hysterectomy    . Bladder tack    . Breast cyst incision and drainage    . Breast surgery    . Ovarian cyst surgery    . Nasal septum surgery    . Fracture surgery      left wrist  . Nissan fundoplication    . Bunionectomy    . Lumbar laminectomy  03/04/2012    Procedure: MICRODISCECTOMY LUMBAR LAMINECTOMY;  Surgeon: Becky Oto, MD;  Location: [facility];  Service: Orthopedics;  Laterality: N/A;  L3-4 central laminectomy    Current Outpatient Prescriptions  Medication Sig Dispense Refill  . allopurinol (ZYLOPRIM) 100 MG tablet Take 100 mg by mouth daily.    Marland Kitchen ALPRAZolam (XANAX) 0.5 MG tablet Take 0.5 mg by mouth daily.    . cetirizine (ZYRTEC) 10 MG tablet Take 10 mg by mouth daily.    . Cholecalciferol 2000 UNITS CAPS Take 4,000 Units by mouth daily.    Marland Kitchen EPIPEN 2-PAK 0.3 MG/0.3ML SOAJ injection 0.3 mg.    . gabapentin (NEURONTIN) 300 MG capsule Take 300-600 mg by mouth 3 (three) times daily. Takes 1 in am and noon, and 2 in pm.    . levothyroxine (SYNTHROID, LEVOTHROID) 75 MCG tablet Take 75 mcg by mouth daily.    Marland Kitchen losartan (COZAAR) 50 MG tablet Take 50 mg by mouth daily.    . metoprolol succinate (TOPROL-XL) 50 MG 24 hr tablet Take 100 mg by mouth daily. Take with or immediately following a meal.    . Multiple Vitamin (MULITIVITAMIN WITH MINERALS) TABS Take 1 tablet by mouth daily.    Marland Kitchen olopatadine (PATANOL) 0.1 % ophthalmic solution Place 1 drop  into both eyes 2 (two) times daily as needed.     . ondansetron (ZOFRAN) 4 MG tablet Take 1 tablet (4 mg total) by mouth every 8 (eight) hours as needed for nausea. 30 tablet 6  . potassium chloride SA (K-DUR,KLOR-CON) 20 MEQ tablet Take 20 mEq by mouth daily as needed. Takes with torsemide.    Marland Kitchen PREMARIN 0.3 MG tablet Take 0.3 mg by mouth daily.    Marland Kitchen QSYMIA 7.5-46 MG CP24 7.5 mg daily.    . SUMAtriptan (IMITREX) 50 MG tablet Take 1 tablet (50 mg total) by mouth every 2 (two) hours as needed for migraine. 15 tablet 11  . topiramate (TOPAMAX) 100 MG tablet 1/2 po bid xone week,  then one tab po bid 60 tablet 11  . torsemide (DEMADEX) 20 MG tablet Take 20 mg by mouth daily as needed. For edema.    Marland Kitchen zolpidem (AMBIEN) 10 MG tablet Take 10 mg by mouth daily.    . SUMAtriptan (IMITREX) 20 MG/ACT nasal spray Place 1 spray (20 mg total) into the nose every 2 (two) hours as needed for migraine or headache. May repeat in 2 hours if headache persists or recurs. 1 Inhaler 6   No current facility-administered medications for this visit.    Allergies as of 10/09/2014 - Review Complete 10/09/2014  Allergen Reaction Noted  . Morphine and related Itching and Other (See Comments) 03/04/2012  . Other Anaphylaxis 07/10/2014  . Erythromycin Hives and Itching 05/12/2012  . Hydrocodone Itching 03/01/2012  . Penicillins Hives 03/01/2012  . Tramadol hcl Itching 01/04/2014  . Hydrocodone-acetaminophen Itching 07/10/2014    Vitals: BP 110/65 mmHg  Hobbs 75  Temp(Src) 97.9 F (36.6 C) (Oral)  Ht 5\' 5"  (1.651 m)  Wt 201 lb (91.173 kg)  BMI 33.45 kg/m2 Last Weight:  Wt Readings from Last 1 Encounters:  10/09/14 201 lb (91.173 kg)   Last Height:   Ht Readings from Last 1 Encounters:  10/09/14 5\' 5"  (1.651 m)   Physical exam: Exam: Gen: NAD, conversant, well nourised, obese, well groomed                     CV: RRR, no MRG. No Carotid Bruits. No peripheral edema, warm, nontender Eyes: Conjunctivae clear  without exudates or hemorrhage  Neuro: Detailed Neurologic Exam  Speech:    Speech is normal; fluent and spontaneous with normal comprehension.  Cognition:    The patient is oriented to person, place, and time;     recent and remote memory intact;     language fluent;     normal attention, concentration,     fund of knowledge Cranial Nerves:    The pupils are equal, round, and reactive to light. The fundi are normal and spontaneous venous pulsations are present. Visual fields are full to finger confrontation. Extraocular movements are intact. Trigeminal sensation is intact and the muscles of mastication are normal. The face is symmetric. The palate elevates in the midline. Hearing intact. Voice is normal. Shoulder shrug is normal. The tongue has normal motion without fasciculations.   Coordination:    Normal finger to nose and heel to shin. Normal rapid alternating movements.   Gait:    Heel-toe and tandem gait are normal.   Motor Observation:    No asymmetry, no atrophy, and no involuntary movements noted. Tone:    Normal muscle tone.    Posture:    Posture is normal. normal erect    Strength:    Strength is V/V in the upper and lower limbs.      Sensation: intact     Reflex Exam:  DTR's:    Deep tendon reflexes in the upper and lower extremities are brisk but symmetric bilaterally.   Toes:    The toes are downgoing bilaterally.   Clonus:    Clonus is absent.       Assessment/Plan:  68 year old female here for follow up of migraines. She is doing well on topamax and triptan, one headache a week. Neuro exam unremarkable. Will continue topamax. She would like to try imitrex nasal spray. If she likes it, she will use it instead of PO imitrex. Advised to chose only one method, do not  use both. Use spray at onset, may repeat in 2 hours. Do not use more than 2x a day or 2 days a week.  Sarina Ill, MD  [facility] [address] [place], [place] [address]  Phone [phone] Fax [phone]  A total of 30 minutes was spent in with this patient. Over half this time was spent on counseling patient on the diagnosis and different therapeutic options available.

## 2014-10-10 DIAGNOSIS — I10 Essential (primary) hypertension: Secondary | ICD-10-CM | POA: Diagnosis not present

## 2014-10-10 DIAGNOSIS — M47817 Spondylosis without myelopathy or radiculopathy, lumbosacral region: Secondary | ICD-10-CM | POA: Diagnosis not present

## 2014-10-10 DIAGNOSIS — F419 Anxiety disorder, unspecified: Secondary | ICD-10-CM | POA: Diagnosis not present

## 2014-10-10 DIAGNOSIS — E039 Hypothyroidism, unspecified: Secondary | ICD-10-CM | POA: Diagnosis not present

## 2014-10-10 DIAGNOSIS — M47896 Other spondylosis, lumbar region: Secondary | ICD-10-CM | POA: Diagnosis not present

## 2014-10-10 DIAGNOSIS — M4316 Spondylolisthesis, lumbar region: Secondary | ICD-10-CM | POA: Diagnosis not present

## 2014-10-10 DIAGNOSIS — M961 Postlaminectomy syndrome, not elsewhere classified: Secondary | ICD-10-CM | POA: Diagnosis not present

## 2014-10-15 DIAGNOSIS — M47896 Other spondylosis, lumbar region: Secondary | ICD-10-CM | POA: Diagnosis present

## 2014-10-15 DIAGNOSIS — M4806 Spinal stenosis, lumbar region: Secondary | ICD-10-CM | POA: Diagnosis not present

## 2014-10-15 DIAGNOSIS — Z23 Encounter for immunization: Secondary | ICD-10-CM | POA: Diagnosis not present

## 2014-10-15 DIAGNOSIS — R54 Age-related physical debility: Secondary | ICD-10-CM | POA: Diagnosis not present

## 2014-10-15 DIAGNOSIS — M961 Postlaminectomy syndrome, not elsewhere classified: Secondary | ICD-10-CM | POA: Diagnosis not present

## 2014-10-15 DIAGNOSIS — M4316 Spondylolisthesis, lumbar region: Secondary | ICD-10-CM | POA: Diagnosis not present

## 2014-10-15 DIAGNOSIS — Z981 Arthrodesis status: Secondary | ICD-10-CM | POA: Diagnosis not present

## 2014-10-15 DIAGNOSIS — E039 Hypothyroidism, unspecified: Secondary | ICD-10-CM | POA: Diagnosis present

## 2014-10-15 DIAGNOSIS — F419 Anxiety disorder, unspecified: Secondary | ICD-10-CM | POA: Diagnosis present

## 2014-10-15 DIAGNOSIS — T84296A Other mechanical complication of internal fixation device of vertebrae, initial encounter: Secondary | ICD-10-CM | POA: Diagnosis not present

## 2014-10-15 DIAGNOSIS — I10 Essential (primary) hypertension: Secondary | ICD-10-CM | POA: Diagnosis present

## 2014-10-17 DIAGNOSIS — T402X5A Adverse effect of other opioids, initial encounter: Secondary | ICD-10-CM | POA: Diagnosis not present

## 2014-10-17 DIAGNOSIS — R54 Age-related physical debility: Secondary | ICD-10-CM | POA: Diagnosis not present

## 2014-10-17 DIAGNOSIS — F419 Anxiety disorder, unspecified: Secondary | ICD-10-CM | POA: Diagnosis not present

## 2014-10-17 DIAGNOSIS — E669 Obesity, unspecified: Secondary | ICD-10-CM | POA: Diagnosis not present

## 2014-10-17 DIAGNOSIS — D649 Anemia, unspecified: Secondary | ICD-10-CM | POA: Diagnosis not present

## 2014-10-17 DIAGNOSIS — M4316 Spondylolisthesis, lumbar region: Secondary | ICD-10-CM | POA: Diagnosis not present

## 2014-10-17 DIAGNOSIS — Z7409 Other reduced mobility: Secondary | ICD-10-CM | POA: Diagnosis not present

## 2014-10-17 DIAGNOSIS — E039 Hypothyroidism, unspecified: Secondary | ICD-10-CM | POA: Diagnosis not present

## 2014-10-17 DIAGNOSIS — E871 Hypo-osmolality and hyponatremia: Secondary | ICD-10-CM | POA: Diagnosis not present

## 2014-10-17 DIAGNOSIS — N390 Urinary tract infection, site not specified: Secondary | ICD-10-CM | POA: Diagnosis not present

## 2014-10-17 DIAGNOSIS — R339 Retention of urine, unspecified: Secondary | ICD-10-CM | POA: Diagnosis not present

## 2014-10-17 DIAGNOSIS — G43909 Migraine, unspecified, not intractable, without status migrainosus: Secondary | ICD-10-CM | POA: Diagnosis not present

## 2014-10-17 DIAGNOSIS — K5909 Other constipation: Secondary | ICD-10-CM | POA: Diagnosis not present

## 2014-10-19 ENCOUNTER — Other Ambulatory Visit: Payer: Self-pay | Admitting: Nurse Practitioner

## 2014-11-09 DIAGNOSIS — F419 Anxiety disorder, unspecified: Secondary | ICD-10-CM | POA: Diagnosis not present

## 2014-11-09 DIAGNOSIS — T402X5D Adverse effect of other opioids, subsequent encounter: Secondary | ICD-10-CM | POA: Diagnosis not present

## 2014-11-09 DIAGNOSIS — Z4789 Encounter for other orthopedic aftercare: Secondary | ICD-10-CM | POA: Diagnosis not present

## 2014-11-12 DIAGNOSIS — M545 Low back pain: Secondary | ICD-10-CM | POA: Diagnosis not present

## 2014-11-13 DIAGNOSIS — F419 Anxiety disorder, unspecified: Secondary | ICD-10-CM | POA: Diagnosis not present

## 2014-11-13 DIAGNOSIS — T402X5D Adverse effect of other opioids, subsequent encounter: Secondary | ICD-10-CM | POA: Diagnosis not present

## 2014-11-13 DIAGNOSIS — Z4789 Encounter for other orthopedic aftercare: Secondary | ICD-10-CM | POA: Diagnosis not present

## 2014-11-15 DIAGNOSIS — N390 Urinary tract infection, site not specified: Secondary | ICD-10-CM | POA: Diagnosis not present

## 2014-11-15 DIAGNOSIS — N183 Chronic kidney disease, stage 3 (moderate): Secondary | ICD-10-CM | POA: Diagnosis not present

## 2014-11-15 DIAGNOSIS — Z6835 Body mass index (BMI) 35.0-35.9, adult: Secondary | ICD-10-CM | POA: Diagnosis not present

## 2014-11-15 DIAGNOSIS — I10 Essential (primary) hypertension: Secondary | ICD-10-CM | POA: Diagnosis not present

## 2014-11-15 DIAGNOSIS — E119 Type 2 diabetes mellitus without complications: Secondary | ICD-10-CM | POA: Diagnosis not present

## 2014-11-15 DIAGNOSIS — B1920 Unspecified viral hepatitis C without hepatic coma: Secondary | ICD-10-CM | POA: Diagnosis not present

## 2014-11-15 DIAGNOSIS — B182 Chronic viral hepatitis C: Secondary | ICD-10-CM | POA: Diagnosis not present

## 2014-11-15 DIAGNOSIS — E039 Hypothyroidism, unspecified: Secondary | ICD-10-CM | POA: Diagnosis not present

## 2014-11-16 DIAGNOSIS — Z4789 Encounter for other orthopedic aftercare: Secondary | ICD-10-CM | POA: Diagnosis not present

## 2014-11-16 DIAGNOSIS — T402X5D Adverse effect of other opioids, subsequent encounter: Secondary | ICD-10-CM | POA: Diagnosis not present

## 2014-11-16 DIAGNOSIS — F419 Anxiety disorder, unspecified: Secondary | ICD-10-CM | POA: Diagnosis not present

## 2014-11-21 DIAGNOSIS — Z4789 Encounter for other orthopedic aftercare: Secondary | ICD-10-CM | POA: Diagnosis not present

## 2014-11-21 DIAGNOSIS — F419 Anxiety disorder, unspecified: Secondary | ICD-10-CM | POA: Diagnosis not present

## 2014-11-21 DIAGNOSIS — T402X5D Adverse effect of other opioids, subsequent encounter: Secondary | ICD-10-CM | POA: Diagnosis not present

## 2014-11-23 DIAGNOSIS — F419 Anxiety disorder, unspecified: Secondary | ICD-10-CM | POA: Diagnosis not present

## 2014-11-23 DIAGNOSIS — T402X5D Adverse effect of other opioids, subsequent encounter: Secondary | ICD-10-CM | POA: Diagnosis not present

## 2014-11-23 DIAGNOSIS — Z4789 Encounter for other orthopedic aftercare: Secondary | ICD-10-CM | POA: Diagnosis not present

## 2014-11-26 DIAGNOSIS — T402X5D Adverse effect of other opioids, subsequent encounter: Secondary | ICD-10-CM | POA: Diagnosis not present

## 2014-11-26 DIAGNOSIS — Z4789 Encounter for other orthopedic aftercare: Secondary | ICD-10-CM | POA: Diagnosis not present

## 2014-11-26 DIAGNOSIS — F419 Anxiety disorder, unspecified: Secondary | ICD-10-CM | POA: Diagnosis not present

## 2014-11-28 DIAGNOSIS — Z4789 Encounter for other orthopedic aftercare: Secondary | ICD-10-CM | POA: Diagnosis not present

## 2014-11-28 DIAGNOSIS — T402X5D Adverse effect of other opioids, subsequent encounter: Secondary | ICD-10-CM | POA: Diagnosis not present

## 2014-11-28 DIAGNOSIS — F419 Anxiety disorder, unspecified: Secondary | ICD-10-CM | POA: Diagnosis not present

## 2014-12-04 DIAGNOSIS — Z23 Encounter for immunization: Secondary | ICD-10-CM | POA: Diagnosis not present

## 2014-12-14 DIAGNOSIS — N183 Chronic kidney disease, stage 3 (moderate): Secondary | ICD-10-CM | POA: Diagnosis not present

## 2014-12-14 DIAGNOSIS — D649 Anemia, unspecified: Secondary | ICD-10-CM | POA: Diagnosis not present

## 2014-12-17 DIAGNOSIS — N183 Chronic kidney disease, stage 3 (moderate): Secondary | ICD-10-CM | POA: Diagnosis not present

## 2014-12-17 DIAGNOSIS — I10 Essential (primary) hypertension: Secondary | ICD-10-CM | POA: Diagnosis not present

## 2014-12-17 DIAGNOSIS — E669 Obesity, unspecified: Secondary | ICD-10-CM | POA: Diagnosis not present

## 2014-12-17 DIAGNOSIS — E611 Iron deficiency: Secondary | ICD-10-CM | POA: Diagnosis not present

## 2014-12-21 ENCOUNTER — Other Ambulatory Visit (HOSPITAL_COMMUNITY): Payer: Self-pay | Admitting: *Deleted

## 2014-12-24 ENCOUNTER — Encounter (HOSPITAL_COMMUNITY): Payer: Self-pay

## 2014-12-24 ENCOUNTER — Encounter (HOSPITAL_COMMUNITY)
Admission: RE | Admit: 2014-12-24 | Discharge: 2014-12-24 | Disposition: A | Discharge status: Home / Self Care | Payer: Medicare Other | Source: Ambulatory Visit | Attending: Nephrology | Admitting: Nephrology

## 2014-12-24 DIAGNOSIS — D509 Iron deficiency anemia, unspecified: Secondary | ICD-10-CM | POA: Insufficient documentation

## 2014-12-24 DIAGNOSIS — Z5181 Encounter for therapeutic drug level monitoring: Secondary | ICD-10-CM | POA: Insufficient documentation

## 2014-12-24 MED ORDER — SODIUM CHLORIDE 0.9 % IV SOLN
510.0000 mg | INTRAVENOUS | Status: DC
  Administered 2014-12-24: 510 mg via INTRAVENOUS
  Filled 2014-12-24: qty 17

## 2014-12-28 ENCOUNTER — Other Ambulatory Visit (HOSPITAL_COMMUNITY): Payer: Self-pay | Admitting: *Deleted

## 2014-12-31 ENCOUNTER — Encounter (HOSPITAL_COMMUNITY)
Admission: RE | Admit: 2014-12-31 | Discharge: 2014-12-31 | Disposition: A | Discharge status: Home / Self Care | Payer: Medicare Other | Source: Ambulatory Visit | Attending: Nephrology | Admitting: Nephrology

## 2014-12-31 DIAGNOSIS — Z5181 Encounter for therapeutic drug level monitoring: Secondary | ICD-10-CM | POA: Diagnosis not present

## 2014-12-31 DIAGNOSIS — D509 Iron deficiency anemia, unspecified: Secondary | ICD-10-CM | POA: Diagnosis not present

## 2014-12-31 MED ORDER — SODIUM CHLORIDE 0.9 % IV SOLN
510.0000 mg | INTRAVENOUS | Status: AC
  Administered 2014-12-31: 510 mg via INTRAVENOUS
  Filled 2014-12-31: qty 17

## 2015-01-01 DIAGNOSIS — B182 Chronic viral hepatitis C: Secondary | ICD-10-CM | POA: Diagnosis not present

## 2015-01-01 DIAGNOSIS — I10 Essential (primary) hypertension: Secondary | ICD-10-CM | POA: Diagnosis not present

## 2015-01-01 DIAGNOSIS — K7469 Other cirrhosis of liver: Secondary | ICD-10-CM | POA: Diagnosis not present

## 2015-01-03 ENCOUNTER — Other Ambulatory Visit (HOSPITAL_COMMUNITY): Payer: Self-pay | Admitting: Nurse Practitioner

## 2015-01-03 DIAGNOSIS — B182 Chronic viral hepatitis C: Secondary | ICD-10-CM

## 2015-01-09 ENCOUNTER — Other Ambulatory Visit (HOSPITAL_COMMUNITY): Payer: Self-pay | Admitting: Nurse Practitioner

## 2015-01-09 DIAGNOSIS — B182 Chronic viral hepatitis C: Secondary | ICD-10-CM

## 2015-01-22 DIAGNOSIS — R600 Localized edema: Secondary | ICD-10-CM | POA: Diagnosis not present

## 2015-01-22 DIAGNOSIS — I10 Essential (primary) hypertension: Secondary | ICD-10-CM | POA: Diagnosis not present

## 2015-01-22 DIAGNOSIS — M79609 Pain in unspecified limb: Secondary | ICD-10-CM | POA: Diagnosis not present

## 2015-01-22 DIAGNOSIS — E119 Type 2 diabetes mellitus without complications: Secondary | ICD-10-CM | POA: Diagnosis not present

## 2015-01-22 DIAGNOSIS — N183 Chronic kidney disease, stage 3 (moderate): Secondary | ICD-10-CM | POA: Diagnosis not present

## 2015-01-22 DIAGNOSIS — M109 Gout, unspecified: Secondary | ICD-10-CM | POA: Diagnosis not present

## 2015-01-22 DIAGNOSIS — B1920 Unspecified viral hepatitis C without hepatic coma: Secondary | ICD-10-CM | POA: Diagnosis not present

## 2015-01-23 ENCOUNTER — Ambulatory Visit (INDEPENDENT_AMBULATORY_CARE_PROVIDER_SITE_OTHER): Payer: Medicare Other

## 2015-01-23 ENCOUNTER — Encounter: Payer: Self-pay | Admitting: Podiatry

## 2015-01-23 ENCOUNTER — Ambulatory Visit (HOSPITAL_COMMUNITY)
Admission: RE | Admit: 2015-01-23 | Discharge: 2015-01-23 | Disposition: A | Discharge status: Home / Self Care | Payer: Medicare Other | Source: Ambulatory Visit | Attending: Nurse Practitioner | Admitting: Nurse Practitioner

## 2015-01-23 ENCOUNTER — Ambulatory Visit (INDEPENDENT_AMBULATORY_CARE_PROVIDER_SITE_OTHER): Payer: Medicare Other | Admitting: Podiatry

## 2015-01-23 VITALS — BP 167/78 | HR 68 | Resp 12

## 2015-01-23 DIAGNOSIS — Z9049 Acquired absence of other specified parts of digestive tract: Secondary | ICD-10-CM | POA: Insufficient documentation

## 2015-01-23 DIAGNOSIS — B181 Chronic viral hepatitis B without delta-agent: Secondary | ICD-10-CM | POA: Diagnosis not present

## 2015-01-23 DIAGNOSIS — R52 Pain, unspecified: Secondary | ICD-10-CM

## 2015-01-23 DIAGNOSIS — M79609 Pain in unspecified limb: Secondary | ICD-10-CM | POA: Diagnosis not present

## 2015-01-23 DIAGNOSIS — I10 Essential (primary) hypertension: Secondary | ICD-10-CM | POA: Insufficient documentation

## 2015-01-23 DIAGNOSIS — M79662 Pain in left lower leg: Secondary | ICD-10-CM | POA: Diagnosis not present

## 2015-01-23 DIAGNOSIS — B182 Chronic viral hepatitis C: Secondary | ICD-10-CM | POA: Insufficient documentation

## 2015-01-23 NOTE — Progress Notes (Signed)
   Subjective:    Patient ID: Becky Hobbs, female    DOB: 29-Dec-1945, 69 y.o.   MRN: [id number]  HPI  N-SORE, SWOLLEN L-LT FOOT ANKLE AND BOTTOM OF THE FOOT D-5 DAYS O-SLOWLY C-WORSE A-PRESSURE T-NONE  This patient describes a sudden swelling in her left lower leg and foot in the past 5 days. She states that she's had a venous Doppler today at [facility] and was told that she does not have a clot. She also presents with lab work, however, no report of the venous  Doppler  Review of Systems  All other systems reviewed and are negative.      Objective:   Physical Exam  Orientated 3 patient presents with daughter  Vascular: DP and PT pulses 2/4 bilaterally Left lower leg/foot has nonpitting edema with palpable tenderness in the left calf without any palpable lesions. There is no warmth or any skin lesions noted.  Neurological: Ankle and knee  reflexes reactive bilaterally  Dermatological: Well-healed surgical incision dorsal left first MPJ  Musculoskeletal: Painful gait favoring left No deformities noted  X-ray examination left foot weightbearing  Intact bony structure without fracture and/or dislocation Retained internal K wire fixation x 2 first metatarsal No emphysema noted Bone density appears to be adequate  Radiographic impression:    X-ray examination left ankle  Intact bony structure without fracture or dislocation Ankle mortise is intact without deformity Increased soft tissue density noted about ankle without emphysema Retained internal fixation noted in left first metatarsal  Radiographic impression: No acute bony abnormality noted in left ankle      No acute bony abnormality noted in the left foot    Copy of lab from [facility] dated 01/22/2015 CBC, uric acid, sedimentation rate  CBC all within normal limits except low RBCs at 4.1  Uric acid 6.7 within normal limits Sedimentation rate 49 slightly elevated  range 0-20       Assessment & Plan:   Assessment: Repeat lower extremity left venous Doppler to rule out DVT  Plan: I discussed with patient and her daughter that because of pain and calf tenderness I wanted to repeat the venous Doppler. I'm referring patient for a repeat lower extremity venous Doppler for the indication of pain and swelling left lower extremity. Patient is advised that if she develops any sudden significant increase in pain, swelling prior to venous Doppler present to the ER  Reappoint upon results of repeat venous Doppler

## 2015-01-23 NOTE — Patient Instructions (Signed)
The vascular lab we'll contact you to schedule a lower extremity left venous Doppler to evaluate your left calf pain If you have any sudden increase in pain, swelling prior to the lab contact you present to ER If the venous Doppler is positive for a clot you will need immediate treatment, present to ER

## 2015-01-24 DIAGNOSIS — M47817 Spondylosis without myelopathy or radiculopathy, lumbosacral region: Secondary | ICD-10-CM | POA: Diagnosis not present

## 2015-01-24 DIAGNOSIS — M4316 Spondylolisthesis, lumbar region: Secondary | ICD-10-CM | POA: Diagnosis not present

## 2015-01-24 DIAGNOSIS — M545 Low back pain: Secondary | ICD-10-CM | POA: Diagnosis not present

## 2015-01-24 DIAGNOSIS — M961 Postlaminectomy syndrome, not elsewhere classified: Secondary | ICD-10-CM | POA: Diagnosis not present

## 2015-01-28 ENCOUNTER — Telehealth: Payer: Self-pay | Admitting: Podiatry

## 2015-01-28 NOTE — Telephone Encounter (Signed)
Patient called stating she was seen by Dr. Amalia Hailey last Wednesday 01/23/2015 and he was sending [facility] a [facility]/test and she has not heard anything from anyone. She stated he said he wanted her to be seen quickly. Please call patient in regards to the status of her appointment. Thank you.

## 2015-01-29 NOTE — Telephone Encounter (Signed)
I'm returning your call from yesterday.  "[facility] called me yesterday.  I have an appointment scheduled for 01/30/2015 at 2:00pm."

## 2015-01-30 ENCOUNTER — Ambulatory Visit (HOSPITAL_COMMUNITY)
Admission: RE | Admit: 2015-01-30 | Discharge: 2015-01-30 | Disposition: A | Discharge status: Home / Self Care | Payer: Medicare Other | Source: Ambulatory Visit | Attending: Cardiovascular Disease | Admitting: Cardiovascular Disease

## 2015-01-30 ENCOUNTER — Other Ambulatory Visit (HOSPITAL_COMMUNITY): Payer: Self-pay | Admitting: *Deleted

## 2015-01-30 DIAGNOSIS — M79662 Pain in left lower leg: Secondary | ICD-10-CM

## 2015-01-30 DIAGNOSIS — R52 Pain, unspecified: Secondary | ICD-10-CM

## 2015-01-30 NOTE — Progress Notes (Signed)
Left lower extremity venous duplex completed. No evidence for DVT, SVT, or Baker's cyst. Hulan Fray, RVT

## 2015-02-13 ENCOUNTER — Telehealth: Payer: Self-pay | Admitting: *Deleted

## 2015-02-13 NOTE — Telephone Encounter (Signed)
I called and informed patient that Dr. Amalia Hailey said doppler study came by normal, no signs of blood clots.  No follow-up is needed for this examination.  "Okay thank you."

## 2015-02-13 NOTE — Telephone Encounter (Signed)
-----   Message from Gean Birchwood, DPM sent at 02/04/2015  7:55 AM EDT ----- Lower extremity venous Doppler left dated 01/31/2015 No evidence of thrombosis or thrombophlebitis No follow-up needed for this examination Notify patient of results

## 2015-02-19 DIAGNOSIS — E611 Iron deficiency: Secondary | ICD-10-CM | POA: Diagnosis not present

## 2015-02-19 DIAGNOSIS — N183 Chronic kidney disease, stage 3 (moderate): Secondary | ICD-10-CM | POA: Diagnosis not present

## 2015-04-08 ENCOUNTER — Other Ambulatory Visit: Payer: Self-pay

## 2015-04-10 ENCOUNTER — Ambulatory Visit: Payer: Medicare Other | Admitting: Neurology

## 2015-04-11 ENCOUNTER — Ambulatory Visit (INDEPENDENT_AMBULATORY_CARE_PROVIDER_SITE_OTHER): Payer: Medicare Other | Admitting: Neurology

## 2015-04-11 ENCOUNTER — Encounter: Payer: Self-pay | Admitting: Neurology

## 2015-04-11 VITALS — BP 137/72 | HR 66 | Temp 97.5°F | Ht 65.0 in | Wt 207.6 lb

## 2015-04-11 DIAGNOSIS — G43009 Migraine without aura, not intractable, without status migrainosus: Secondary | ICD-10-CM | POA: Diagnosis not present

## 2015-04-11 MED ORDER — ALBUTEROL SULFATE HFA 108 (90 BASE) MCG/ACT IN AERS: 2.0000 | INHALATION_SPRAY | Freq: Four times a day (QID) | RESPIRATORY_TRACT | Status: DC | PRN

## 2015-04-11 MED ORDER — SUMATRIPTAN SUCCINATE 50 MG PO TABS: 50.0000 mg | ORAL_TABLET | Freq: Once | ORAL | Status: DC

## 2015-04-11 MED ORDER — TOPIRAMATE 100 MG PO TABS: 50.0000 mg | ORAL_TABLET | Freq: Two times a day (BID) | ORAL | Status: DC

## 2015-04-11 MED ORDER — EPINEPHRINE 0.3 MG/0.3ML IJ SOAJ: 0.3000 mg | Freq: Once | INTRAMUSCULAR | Status: DC

## 2015-04-11 NOTE — Patient Instructions (Signed)
Overall you are doing well but I do want to suggest a few things today:   Remember to drink plenty of fluid, eat healthy meals and do not skip any meals. Try to eat protein with a every meal and eat a healthy snack such as fruit or nuts in between meals. Try to keep a regular sleep-wake schedule and try to exercise daily, particularly in the form of walking, 20-30 minutes a day, if you can.   As far as your medications are concerned, I would like to suggest: Can try Cambia or Relpax or Treximet at onset of headache to compare efficacy  I would like to see you back in one year sooner if we need to. Please call us with any interim questions, concerns, problems, updates or refill requests.   Please also call us for any test results so we can go over those with you on the phone.  My clinical assistant and will answer any of your questions and relay your messages to me and also relay most of my messages to you.   Our phone number is [phone]. We also have an after hours call service for urgent matters and there is a physician on-call for urgent questions. For any emergencies you know to call 911 or go to the nearest emergency room

## 2015-04-11 NOTE — Progress Notes (Signed)
[id number]    Provider:  Dr Jaynee Eagles Referring Provider: Marton Redwood, MD Primary Care Physician:  Marton Redwood, MD  CC:  migraine  Interval history: Migraines are stable. She is still having them once a week. She takes the triptan then they go away.  For acute management, she takes just one triptan. Then she sleeps and it goes away. She hasn't really been taking the Topamax, she said it made her feel like she had cognitive problems. Discussed that Topamax can do and that we could probably decrease the dose. Imitrex helps but it does not make the headache go away, she still has to sleep for a while. Discussed trying some samples of Treximet or Relpax which might work better but do not use them together. Use them both just like the Imitrex, at onset of headache may repeat in 2 hours do not use more than 2 times in 24 hours or 2 days a week. She also has Qysmia on her med list for weight loss, I did advise her that this drug does have Topamax and is well so to discuss that with prescribing physician so as not to unknowingly increased dosage of one medication.  Previous History Dr. Krista Blue:  Becky Hobbs is a 69 years old right-handed African American female, referred by her primary care physician Dr. Marton Redwood for evaluation of headaches, concussion, and gait difficulty, initial visit was in July 2014.   She had past medical history of hypertension, diabetes, hypothyroidism, in May 12th 2014, she came downstairs, stepped into a puddle of water, fell backwards, landed on her occipital area, there was no loss of consciousness, she could go on cleaning the floor, it was a leakage from 3 gallon water container, she has no difficulty completing the task, afterwards, she drove to her nephew's house, by then she was noticed to stumbled over, couple days later, she began to have frequent headaches, vertex bilateral retro-orbital area severe pounding headaches with associated light noise  sensitivity, she also complains of constant gait difficulty, bilateral lower extremity tremorish sensation when bearing weight,   She also has difficulty sleeping, her headache are pouding, movements make it worse, with associated light noise sensitivity, she denies visual loss, she complains of neck pain, radiating pain to her bilateral shoulder. She denies bowel and bladder incontinence   CT head without contrast in June 2014 showed no acute lesions,  MRI of cervical spine in September 2013 evaluating for gait difficulty, bilateral upper extremity paresthesia showed multilevel degenerative disc disease, most severe C5-6, C6 and 7 with mild canal stenosis, mild to moderate left more than right foraminal stenosis.   04/21/13 scan of cervical spine showing prominent spondylitic changes most prominent at C5-6 and C6-7 where there is a broad-based disc osteophyte complexes resulting in mild canal and moderate left greater than right foraminal narrowing. Overall no significant changes compared with previous MRI scan dated 07/01/2012.    UPDATE Sept 29th 2015:  Her headache overall has much improved, instead of daily headaches, she is having headaches 2-3 times each week, starting at the right parietal area, pounding, with associated light noise sensitivity, nauseous, movements make it worse, she preferred to lie down in dark quiet room, lasting half days,  She did have a history of migraine headaches in the past  Trigger for her headaches are stress, sleep deprivation, insomnia, she continued having low back pain, has been receving epidural injections by Dr. Maia Petties  ROS: Patient complains of the following review of systems, endorses blurry  vision, runny nose, insomnia, incontinence of bladder, back pain, headache, decreased concentration. All others negative.    History   Social History  . Marital Status: Widowed    Spouse Name: N/A  . Number of Children: 3  . Years of Education: college    Occupational History  . retired     Retired   Social History Main Topics  . Smoking status: Never Smoker   . Smokeless tobacco: Never Used  . Alcohol Use: No  . Drug Use: No  . Sexual Activity: No   Other Topics Concern  . Not on file   Social History Narrative   Patient has a high school education.    Patient is widow.   Patient is retired.    History reviewed. No pertinent family history.  Past Medical History  Diagnosis Date  . Hypertension   . H/O hiatal hernia     surgery fixed  . Diabetes mellitus     borderline  . Arthritis   . Anxiety     panic attacks  . Asthma   . Hypothyroidism   . Heart murmur     MVP  . Blood transfusion     1973--with twins birth    Past Surgical History  Procedure Laterality Date  . Cholecystectomy    . Back surgery    . Tonsillectomy    . Appendectomy    . Abdominal hysterectomy    . Bladder tack    . Breast cyst incision and drainage    . Breast surgery    . Ovarian cyst surgery    . Nasal septum surgery    . Fracture surgery      left wrist  . Nissan fundoplication    . Bunionectomy    . Lumbar laminectomy  03/04/2012    Procedure: MICRODISCECTOMY LUMBAR LAMINECTOMY;  Surgeon: Jessy Oto, MD;  Location: [facility];  Service: Orthopedics;  Laterality: N/A;  L3-4 central laminectomy    Current Outpatient Prescriptions  Medication Sig Dispense Refill  . allopurinol (ZYLOPRIM) 100 MG tablet Take 100 mg by mouth daily.    Marland Kitchen ALPRAZolam (XANAX) 0.5 MG tablet Take 0.5 mg by mouth daily.    . baclofen (LIORESAL) 10 MG tablet Take 10 mg by mouth 3 (three) times daily. Pt takes as needed because "it makes me crazy"  0  . cetirizine (ZYRTEC) 10 MG tablet Take 10 mg by mouth daily.    . Cholecalciferol 2000 UNITS CAPS Take 4,000 Units by mouth daily.    Marland Kitchen EPIPEN 2-PAK 0.3 MG/0.3ML SOAJ injection 0.3 mg.    . gabapentin (NEURONTIN) 300 MG capsule Take 600 mg by mouth 3 (three) times daily. Takes 1 in am and noon, and 2 in pm.     . levothyroxine (SYNTHROID, LEVOTHROID) 75 MCG tablet Take 75 mcg by mouth daily.    Marland Kitchen losartan (COZAAR) 50 MG tablet Take 100 mg by mouth daily.     . metoprolol succinate (TOPROL-XL) 50 MG 24 hr tablet Take 100 mg by mouth daily. Take with or immediately following a meal.    . Multiple Vitamin (MULITIVITAMIN WITH MINERALS) TABS Take 1 tablet by mouth daily.    Marland Kitchen olopatadine (PATANOL) 0.1 % ophthalmic solution Place 1 drop into both eyes 2 (two) times daily as needed.     . ondansetron (ZOFRAN) 4 MG tablet Take 1 tablet (4 mg total) by mouth every 8 (eight) hours as needed for nausea. 30 tablet 6  . oxyCODONE-acetaminophen (PERCOCET/ROXICET) 5-325  MG per tablet Take 1 tablet by mouth as needed.  0  . potassium chloride SA (K-DUR,KLOR-CON) 20 MEQ tablet Take 20 mEq by mouth daily as needed. Takes with torsemide.    Marland Kitchen PREMARIN 0.3 MG tablet Take 0.3 mg by mouth daily. Pt states she take "1 every 3 days to wean off"    . Probiotic Product (PROBIOTIC PO) Take 1 capsule by mouth daily.    Marland Kitchen QSYMIA 7.5-46 MG CP24 7.5 mg daily.    . SUMAtriptan (IMITREX) 20 MG/ACT nasal spray Place 1 spray (20 mg total) into the nose every 2 (two) hours as needed for migraine or headache. May repeat in 2 hours if headache persists or recurs. (Patient not taking: Reported on 04/11/2015) 1 Inhaler 6  . SUMAtriptan (IMITREX) 50 MG tablet TAKE 1 TABLET BY MOUTH EVERY 2 HOURS AS NEEDED FOR MIGRAINE AS DIRECTED 15 tablet 6  . topiramate (TOPAMAX) 100 MG tablet 1/2 po bid xone week, then one tab po bid (Patient not taking: Reported on 04/11/2015) 60 tablet 11  . torsemide (DEMADEX) 20 MG tablet Take 20 mg by mouth daily as needed. For edema.    Marland Kitchen zolpidem (AMBIEN) 10 MG tablet Take 10 mg by mouth daily.     No current facility-administered medications for this visit.    Allergies as of 04/11/2015 - Review Complete 04/11/2015  Allergen Reaction Noted  . Morphine and related Itching and Other (See Comments) 03/04/2012  .  Other Anaphylaxis 07/10/2014  . Erythromycin Hives and Itching 05/12/2012  . Hydrocodone Itching 03/01/2012  . Penicillins Hives 03/01/2012  . Tramadol hcl Itching 01/04/2014  . Hydrocodone-acetaminophen Itching 07/10/2014    Vitals: BP 137/72 mmHg  Pulse 66  Temp(Src) 97.5 F (36.4 C) (Oral)  Ht 5\' 5"  (1.651 m)  Wt 207 lb 9.6 oz (94.167 kg)  BMI 34.55 kg/m2 Last Weight:  Wt Readings from Last 1 Encounters:  04/11/15 207 lb 9.6 oz (94.167 kg)   Last Height:   Ht Readings from Last 1 Encounters:  04/11/15 5\' 5"  (1.651 m)     Exam: Gen: NAD, conversant, well nourised, obese, well groomed  CV: RRR, no MRG. No Carotid Bruits. No peripheral edema, warm, nontender Eyes: Conjunctivae clear without exudates or hemorrhage  Neuro: Detailed Neurologic Exam  Speech:  Speech is normal; fluent and spontaneous with normal comprehension.  Cognition:  The patient is oriented to person, place, and time;   recent and remote memory intact;   language fluent;   normal attention, concentration,   fund of knowledge Cranial Nerves:  The pupils are equal, round, and reactive to light. The fundi are normal and spontaneous venous pulsations are present. Visual fields are full to finger confrontation. Extraocular movements are intact. Trigeminal sensation is intact and the muscles of mastication are normal. The face is symmetric. The palate elevates in the midline. Hearing intact. Voice is normal. Shoulder shrug is normal. The tongue has normal motion without fasciculations.   Coordination:  Normal finger to nose and heel to shin. Normal rapid alternating movements.   Gait:  Heel-toe and tandem gait are normal.   Motor Observation:  No asymmetry, no atrophy, and no involuntary movements noted. Tone:  Normal muscle tone.   Posture:  Posture is normal. normal erect   Strength:  Strength is V/V in the upper and lower limbs.     Sensation: intact   Reflex Exam:  DTR's:  Deep tendon reflexes in the upper and lower extremities are brisk but symmetric bilaterally.  Toes:  The toes are downgoing bilaterally.  Clonus:  Clonus is absent.   Assessment/Plan: 69 year old female here for follow up of migraines. She is having side effects with topamax, still having one headache a week with some relief from Imitrex. Neuro exam unremarkable. Will continue topamax at a lower dose.   Gave her some samples of Treximet and Relpax to see if that works better. Sarina Ill, MD  [facility] [address] [place], [place] [address]  Phone [phone] Fax [phone]  A total of 15 minutes was spent face-to-face with this patient. Over half this time was spent on counseling patient on the migraine diagnosis and different diagnostic and therapeutic options available.

## 2015-04-17 ENCOUNTER — Encounter: Payer: Self-pay | Admitting: Podiatry

## 2015-04-17 ENCOUNTER — Ambulatory Visit (INDEPENDENT_AMBULATORY_CARE_PROVIDER_SITE_OTHER): Payer: Medicare Other | Admitting: Podiatry

## 2015-04-17 ENCOUNTER — Ambulatory Visit (INDEPENDENT_AMBULATORY_CARE_PROVIDER_SITE_OTHER): Payer: Medicare Other

## 2015-04-17 VITALS — BP 137/66 | HR 79 | Resp 12

## 2015-04-17 DIAGNOSIS — N183 Chronic kidney disease, stage 3 (moderate): Secondary | ICD-10-CM | POA: Diagnosis not present

## 2015-04-17 DIAGNOSIS — R52 Pain, unspecified: Secondary | ICD-10-CM | POA: Diagnosis not present

## 2015-04-17 DIAGNOSIS — M545 Low back pain: Secondary | ICD-10-CM | POA: Diagnosis not present

## 2015-04-17 DIAGNOSIS — M47817 Spondylosis without myelopathy or radiculopathy, lumbosacral region: Secondary | ICD-10-CM | POA: Diagnosis not present

## 2015-04-17 DIAGNOSIS — M109 Gout, unspecified: Secondary | ICD-10-CM

## 2015-04-17 DIAGNOSIS — M4316 Spondylolisthesis, lumbar region: Secondary | ICD-10-CM | POA: Diagnosis not present

## 2015-04-17 DIAGNOSIS — E611 Iron deficiency: Secondary | ICD-10-CM | POA: Diagnosis not present

## 2015-04-17 DIAGNOSIS — M961 Postlaminectomy syndrome, not elsewhere classified: Secondary | ICD-10-CM | POA: Diagnosis not present

## 2015-04-17 MED ORDER — DEXAMETHASONE SODIUM PHOSPHATE 120 MG/30ML IJ SOLN
4.0000 mg | Freq: Once | INTRAMUSCULAR | Status: AC
  Administered 2015-04-17: 4 mg via INTRA_ARTICULAR

## 2015-04-17 MED ORDER — PREDNISONE 10 MG (21) PO TBPK: 10.0000 mg | ORAL_TABLET | Freq: Every day | ORAL | Status: DC

## 2015-04-17 NOTE — Progress Notes (Signed)
   Subjective:    Patient ID: Becky Hobbs, female    DOB: 1946-06-07, 69 y.o.   MRN: [id number]  HPI N-SORE, BURNING SENSATION, TENDER TO TOUCH L-RT FOOT BUNION D-1 WEEK O-SUDDENLY C-WORSE A-PRESSURE T-NONE  Patient has sudden swelling and redness localized to the right great toe joint without any history of direct injury. She has a history of gout and currently taking 100 mg of allopurinol daily  Review of Systems  Cardiovascular: Positive for leg swelling.  Musculoskeletal: Positive for back pain and gait problem.       Objective:   Physical Exam  Orientated 3  Vascular: Pitting edema ankles bilaterally DP and PT pulses 2/4 bilaterally Capillary reflex immediate bilaterally  Neurological: Sensation to 10 g monofilament wire intact 5/5 bilaterally Reflexes reactive bilaterally  Dermatological: Right first MPJ is erythematous, edematous, warm without any open lesions Well-healed surgical scar left first MPJ  Musculoskeletal: HAV deformity right Pain on palpation medial first MPJ and guarding on range of motion right first MPJ  X-ray examination weightbearing right foot  Intact bony structure without fracture and/or dislocation Mild bunion deformity No emphysema noted Inferior calcaneal spur No increased soft tissue density noted  Radiographic impression: No acute bony abnormality noted in the right foot  Lab dated 04/17/2015 Sedimentation rate elevated at 51 Uric acid 6.7  normal range 2.4-7 CBC Decrease RBCs 3.82 Decrease hemoglobin 11.0 Decreased hematocrit 34.4 Increased RDW 15.9 Increased eosinophils 6      Assessment & Plan:   Assessment: 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 I offered patient dexamethasone injection  she verbally consented.  The skin was prepped with alcohol and Betadine. A periarticular bursal injection was given about the medial first MPJ utilizing 4 mg of dexamethasone phosphate and 10 mg of plain Marcaine. There was no fluid aspirated. The patient tolerated procedure without any difficulty.  Patient was referred to lab for CBC, sedimentation rate, serum uric acid  Rx 6 today 10 mg prednisone tapering Dosepak for total of 21 tablets  Reappoint 7 days    Skin was prepped with alcohol and Betadine

## 2015-04-17 NOTE — Patient Instructions (Signed)
Sent to lab for lab test Begin oral 10 mg prednisone as instructed for a total of 21 tablets over 6 days

## 2015-04-18 DIAGNOSIS — M109 Gout, unspecified: Secondary | ICD-10-CM | POA: Diagnosis not present

## 2015-04-18 DIAGNOSIS — R52 Pain, unspecified: Secondary | ICD-10-CM | POA: Diagnosis not present

## 2015-04-18 LAB — CBC WITH DIFFERENTIAL/PLATELET
Basophils Absolute: 0 10*3/uL (ref 0.0–0.1)
Basophils Relative: 0 % (ref 0–1)
Eosinophils Absolute: 0.4 10*3/uL (ref 0.0–0.7)
Eosinophils Relative: 6 % — ABNORMAL HIGH (ref 0–5)
HCT: 34.4 % — ABNORMAL LOW (ref 36.0–46.0)
Hemoglobin: 11 g/dL — ABNORMAL LOW (ref 12.0–15.0)
Lymphocytes Relative: 22 % (ref 12–46)
Lymphs Abs: 1.3 10*3/uL (ref 0.7–4.0)
MCH: 28.8 pg (ref 26.0–34.0)
MCHC: 32 g/dL (ref 30.0–36.0)
MCV: 90.1 fL (ref 78.0–100.0)
MPV: 10.2 fL (ref 8.6–12.4)
Monocytes Absolute: 0.5 10*3/uL (ref 0.1–1.0)
Monocytes Relative: 8 % (ref 3–12)
Neutro Abs: 3.8 10*3/uL (ref 1.7–7.7)
Neutrophils Relative %: 64 % (ref 43–77)
Platelets: 193 10*3/uL (ref 150–400)
RBC: 3.82 MIL/uL — ABNORMAL LOW (ref 3.87–5.11)
RDW: 15.9 % — ABNORMAL HIGH (ref 11.5–15.5)
WBC: 6 10*3/uL (ref 4.0–10.5)

## 2015-04-18 LAB — SEDIMENTATION RATE: Sed Rate: 51 mm/hr — ABNORMAL HIGH (ref 0–30)

## 2015-04-18 LAB — URIC ACID: Uric Acid, Serum: 6.7 mg/dL (ref 2.4–7.0)

## 2015-04-18 MED ORDER — DEXAMETHASONE SODIUM PHOSPHATE 120 MG/30ML IJ SOLN
4.0000 mg | Freq: Once | INTRAMUSCULAR | Status: AC
  Administered 2015-04-18: 4 mg via INTRA_ARTICULAR

## 2015-04-24 DIAGNOSIS — M10371 Gout due to renal impairment, right ankle and foot: Secondary | ICD-10-CM | POA: Diagnosis not present

## 2015-04-24 DIAGNOSIS — L739 Follicular disorder, unspecified: Secondary | ICD-10-CM | POA: Diagnosis not present

## 2015-04-24 DIAGNOSIS — Z981 Arthrodesis status: Secondary | ICD-10-CM | POA: Diagnosis not present

## 2015-04-25 ENCOUNTER — Other Ambulatory Visit: Payer: Self-pay | Admitting: Specialist

## 2015-04-25 DIAGNOSIS — I739 Peripheral vascular disease, unspecified: Secondary | ICD-10-CM

## 2015-04-29 ENCOUNTER — Ambulatory Visit
Admission: RE | Admit: 2015-04-29 | Discharge: 2015-04-29 | Disposition: A | Discharge status: Home / Self Care | Payer: Medicare Other | Source: Ambulatory Visit | Attending: Specialist | Admitting: Specialist

## 2015-04-29 DIAGNOSIS — I739 Peripheral vascular disease, unspecified: Secondary | ICD-10-CM

## 2015-05-01 ENCOUNTER — Encounter: Payer: Self-pay | Admitting: Podiatry

## 2015-05-01 ENCOUNTER — Ambulatory Visit (INDEPENDENT_AMBULATORY_CARE_PROVIDER_SITE_OTHER): Payer: Medicare Other | Admitting: Podiatry

## 2015-05-01 VITALS — BP 160/77 | HR 60 | Resp 12

## 2015-05-01 DIAGNOSIS — M109 Gout, unspecified: Secondary | ICD-10-CM

## 2015-05-01 NOTE — Progress Notes (Signed)
   Subjective:    Patient ID: Becky Hobbs, female    DOB: 1946-03-31, 69 y.o.   MRN: [id number]  HPI  This patient presents for follow-up visit from 04/18/2015. At that time a provisional diagnosis of gouty arthritis was made and a periarticular dexamethasone phosphate injection was given into the right first MPJ area. A 6 day 10 mg tapering Dosepak was prescribed. Patient has completed all medications and states that her right foot is pain free at this time. Since that visit she seeing her orthopedic doctor who reviewed are results and because patient was complaining some upper extremity cramping he ordered a Doppler examination.. She is not aware of the results of the Doppler at this time. Also, patient has a pending appointment with internist Dr. Brigitte Pulse on August 31 for follow-up evaluation of the probable gout episode she had in her right first MPJ. Patient is currently taking 100 mg of allopurinol daily   Review of Systems  All other systems reviewed and are negative.      Objective:   Physical Exam  Patient appears orientated 3  Vascular: No calf pain or calf edema bilaterally No peripheral edema noted bilaterally DP and PT pulses 2/4 bilaterally Capillary reflex immediate bilaterally  Neurological: Sensation to 10 g monofilament wire intact 5/5 bilaterally Ankle reflex equal and reactive bilaterally Vibratory sensation intact bilaterally  Dermatological: There is no erythema, edema, warmth in the right first MPJ area  Musculoskeletal: HAV deformity right No tenderness on range of motion or palpation right first MPJ     Assessment & Plan:   Assessment: Resolve gouty episode right first MPJ  Plan: At this time I reviewed the results of the examination today. Clinically patient is average sitting no symptoms of gouty arthritis at this time. I encouraged her to maintain her allopurinol as prescribed and contact her treating doctor for gout to ask if they want to see  her sooner than her scheduled visit of August 31.  Reappoint at patient's request

## 2015-05-01 NOTE — Patient Instructions (Addendum)
Contact your internist and advise him that most likely you had a gout attack in your right great toe joint which resolved with oral 10mg  prednisone six-day Dosepak and then injection of 4 mg of dexamethasone phosphate. Ask him if you need a modification of your allopurinol which is currently 100 mg daily

## 2015-05-29 ENCOUNTER — Other Ambulatory Visit: Payer: Self-pay | Admitting: Obstetrics and Gynecology

## 2015-05-29 DIAGNOSIS — Z124 Encounter for screening for malignant neoplasm of cervix: Secondary | ICD-10-CM | POA: Diagnosis not present

## 2015-05-29 DIAGNOSIS — I10 Essential (primary) hypertension: Secondary | ICD-10-CM | POA: Diagnosis not present

## 2015-05-29 DIAGNOSIS — Z1231 Encounter for screening mammogram for malignant neoplasm of breast: Secondary | ICD-10-CM | POA: Diagnosis not present

## 2015-05-29 DIAGNOSIS — Z6836 Body mass index (BMI) 36.0-36.9, adult: Secondary | ICD-10-CM | POA: Diagnosis not present

## 2015-05-29 DIAGNOSIS — E039 Hypothyroidism, unspecified: Secondary | ICD-10-CM | POA: Diagnosis not present

## 2015-05-29 DIAGNOSIS — Z01419 Encounter for gynecological examination (general) (routine) without abnormal findings: Secondary | ICD-10-CM | POA: Diagnosis not present

## 2015-05-29 DIAGNOSIS — M791 Myalgia: Secondary | ICD-10-CM | POA: Diagnosis not present

## 2015-05-30 LAB — CYTOLOGY - PAP

## 2015-06-05 DIAGNOSIS — E119 Type 2 diabetes mellitus without complications: Secondary | ICD-10-CM | POA: Diagnosis not present

## 2015-06-05 DIAGNOSIS — I10 Essential (primary) hypertension: Secondary | ICD-10-CM | POA: Diagnosis not present

## 2015-06-12 DIAGNOSIS — B1920 Unspecified viral hepatitis C without hepatic coma: Secondary | ICD-10-CM | POA: Diagnosis not present

## 2015-06-12 DIAGNOSIS — Z6837 Body mass index (BMI) 37.0-37.9, adult: Secondary | ICD-10-CM | POA: Diagnosis not present

## 2015-06-12 DIAGNOSIS — M791 Myalgia: Secondary | ICD-10-CM | POA: Diagnosis not present

## 2015-06-12 DIAGNOSIS — Z0000 Encounter for general adult medical examination without abnormal findings: Secondary | ICD-10-CM | POA: Diagnosis not present

## 2015-06-12 DIAGNOSIS — M109 Gout, unspecified: Secondary | ICD-10-CM | POA: Diagnosis not present

## 2015-06-12 DIAGNOSIS — E119 Type 2 diabetes mellitus without complications: Secondary | ICD-10-CM | POA: Diagnosis not present

## 2015-06-12 DIAGNOSIS — N183 Chronic kidney disease, stage 3 (moderate): Secondary | ICD-10-CM | POA: Diagnosis not present

## 2015-06-12 DIAGNOSIS — E039 Hypothyroidism, unspecified: Secondary | ICD-10-CM | POA: Diagnosis not present

## 2015-06-12 DIAGNOSIS — Z1389 Encounter for screening for other disorder: Secondary | ICD-10-CM | POA: Diagnosis not present

## 2015-06-12 DIAGNOSIS — E669 Obesity, unspecified: Secondary | ICD-10-CM | POA: Diagnosis not present

## 2015-06-12 DIAGNOSIS — G629 Polyneuropathy, unspecified: Secondary | ICD-10-CM | POA: Diagnosis not present

## 2015-06-12 DIAGNOSIS — I10 Essential (primary) hypertension: Secondary | ICD-10-CM | POA: Diagnosis not present

## 2015-06-14 DIAGNOSIS — G894 Chronic pain syndrome: Secondary | ICD-10-CM | POA: Diagnosis not present

## 2015-06-14 DIAGNOSIS — M47817 Spondylosis without myelopathy or radiculopathy, lumbosacral region: Secondary | ICD-10-CM | POA: Diagnosis not present

## 2015-06-14 DIAGNOSIS — M791 Myalgia: Secondary | ICD-10-CM | POA: Diagnosis not present

## 2015-06-14 DIAGNOSIS — Z79891 Long term (current) use of opiate analgesic: Secondary | ICD-10-CM | POA: Diagnosis not present

## 2015-06-14 DIAGNOSIS — M4316 Spondylolisthesis, lumbar region: Secondary | ICD-10-CM | POA: Diagnosis not present

## 2015-06-14 DIAGNOSIS — M545 Low back pain: Secondary | ICD-10-CM | POA: Diagnosis not present

## 2015-06-28 DIAGNOSIS — N183 Chronic kidney disease, stage 3 (moderate): Secondary | ICD-10-CM | POA: Diagnosis not present

## 2015-07-03 DIAGNOSIS — B182 Chronic viral hepatitis C: Secondary | ICD-10-CM | POA: Diagnosis not present

## 2015-07-03 DIAGNOSIS — K7469 Other cirrhosis of liver: Secondary | ICD-10-CM | POA: Diagnosis not present

## 2015-07-04 ENCOUNTER — Other Ambulatory Visit: Payer: Self-pay | Admitting: Nurse Practitioner

## 2015-07-04 DIAGNOSIS — I10 Essential (primary) hypertension: Secondary | ICD-10-CM | POA: Diagnosis not present

## 2015-07-04 DIAGNOSIS — B182 Chronic viral hepatitis C: Secondary | ICD-10-CM

## 2015-07-04 DIAGNOSIS — M961 Postlaminectomy syndrome, not elsewhere classified: Secondary | ICD-10-CM | POA: Diagnosis not present

## 2015-07-04 DIAGNOSIS — M47817 Spondylosis without myelopathy or radiculopathy, lumbosacral region: Secondary | ICD-10-CM | POA: Diagnosis not present

## 2015-07-04 DIAGNOSIS — M545 Low back pain: Secondary | ICD-10-CM | POA: Diagnosis not present

## 2015-07-04 DIAGNOSIS — E611 Iron deficiency: Secondary | ICD-10-CM | POA: Diagnosis not present

## 2015-07-04 DIAGNOSIS — N183 Chronic kidney disease, stage 3 (moderate): Secondary | ICD-10-CM | POA: Diagnosis not present

## 2015-07-04 DIAGNOSIS — E1129 Type 2 diabetes mellitus with other diabetic kidney complication: Secondary | ICD-10-CM | POA: Diagnosis not present

## 2015-07-04 DIAGNOSIS — K7469 Other cirrhosis of liver: Secondary | ICD-10-CM

## 2015-07-05 ENCOUNTER — Ambulatory Visit
Admission: RE | Admit: 2015-07-05 | Discharge: 2015-07-05 | Disposition: A | Discharge status: Home / Self Care | Payer: Medicare Other | Source: Ambulatory Visit | Attending: Nurse Practitioner | Admitting: Nurse Practitioner

## 2015-07-05 DIAGNOSIS — B182 Chronic viral hepatitis C: Secondary | ICD-10-CM | POA: Diagnosis not present

## 2015-07-05 DIAGNOSIS — K7469 Other cirrhosis of liver: Secondary | ICD-10-CM

## 2015-07-22 DIAGNOSIS — K7469 Other cirrhosis of liver: Secondary | ICD-10-CM | POA: Diagnosis not present

## 2015-07-23 DIAGNOSIS — Z1212 Encounter for screening for malignant neoplasm of rectum: Secondary | ICD-10-CM | POA: Diagnosis not present

## 2015-08-07 DIAGNOSIS — L538 Other specified erythematous conditions: Secondary | ICD-10-CM | POA: Diagnosis not present

## 2015-08-07 DIAGNOSIS — I10 Essential (primary) hypertension: Secondary | ICD-10-CM | POA: Diagnosis not present

## 2015-08-07 DIAGNOSIS — T2103XA Burn of unspecified degree of upper back, initial encounter: Secondary | ICD-10-CM | POA: Diagnosis not present

## 2015-08-21 DIAGNOSIS — M961 Postlaminectomy syndrome, not elsewhere classified: Secondary | ICD-10-CM | POA: Diagnosis not present

## 2015-08-21 DIAGNOSIS — M47817 Spondylosis without myelopathy or radiculopathy, lumbosacral region: Secondary | ICD-10-CM | POA: Diagnosis not present

## 2015-08-21 DIAGNOSIS — M545 Low back pain: Secondary | ICD-10-CM | POA: Diagnosis not present

## 2015-09-02 DIAGNOSIS — M461 Sacroiliitis, not elsewhere classified: Secondary | ICD-10-CM | POA: Diagnosis not present

## 2015-09-06 ENCOUNTER — Encounter (HOSPITAL_COMMUNITY): Payer: Self-pay | Admitting: Emergency Medicine

## 2015-09-06 ENCOUNTER — Inpatient Hospital Stay (HOSPITAL_COMMUNITY)
Admission: EM | Admit: 2015-09-06 | Discharge: 2015-09-10 | DRG: 683 | Disposition: A | Discharge status: Home / Self Care | Payer: Medicare Other | Attending: Internal Medicine | Admitting: Internal Medicine

## 2015-09-06 DIAGNOSIS — E861 Hypovolemia: Secondary | ICD-10-CM | POA: Diagnosis present

## 2015-09-06 DIAGNOSIS — D631 Anemia in chronic kidney disease: Secondary | ICD-10-CM | POA: Diagnosis not present

## 2015-09-06 DIAGNOSIS — Z7982 Long term (current) use of aspirin: Secondary | ICD-10-CM

## 2015-09-06 DIAGNOSIS — S199XXA Unspecified injury of neck, initial encounter: Secondary | ICD-10-CM | POA: Diagnosis not present

## 2015-09-06 DIAGNOSIS — Z841 Family history of disorders of kidney and ureter: Secondary | ICD-10-CM

## 2015-09-06 DIAGNOSIS — Z794 Long term (current) use of insulin: Secondary | ICD-10-CM

## 2015-09-06 DIAGNOSIS — E039 Hypothyroidism, unspecified: Secondary | ICD-10-CM

## 2015-09-06 DIAGNOSIS — N189 Chronic kidney disease, unspecified: Secondary | ICD-10-CM | POA: Diagnosis present

## 2015-09-06 DIAGNOSIS — R531 Weakness: Secondary | ICD-10-CM | POA: Diagnosis not present

## 2015-09-06 DIAGNOSIS — N390 Urinary tract infection, site not specified: Secondary | ICD-10-CM | POA: Diagnosis present

## 2015-09-06 DIAGNOSIS — R52 Pain, unspecified: Secondary | ICD-10-CM

## 2015-09-06 DIAGNOSIS — E871 Hypo-osmolality and hyponatremia: Secondary | ICD-10-CM | POA: Diagnosis not present

## 2015-09-06 DIAGNOSIS — I129 Hypertensive chronic kidney disease with stage 1 through stage 4 chronic kidney disease, or unspecified chronic kidney disease: Secondary | ICD-10-CM | POA: Diagnosis present

## 2015-09-06 DIAGNOSIS — Z886 Allergy status to analgesic agent status: Secondary | ICD-10-CM

## 2015-09-06 DIAGNOSIS — N179 Acute kidney failure, unspecified: Secondary | ICD-10-CM | POA: Diagnosis not present

## 2015-09-06 DIAGNOSIS — R7303 Prediabetes: Secondary | ICD-10-CM | POA: Diagnosis present

## 2015-09-06 DIAGNOSIS — M25561 Pain in right knee: Secondary | ICD-10-CM

## 2015-09-06 DIAGNOSIS — R079 Chest pain, unspecified: Secondary | ICD-10-CM | POA: Diagnosis not present

## 2015-09-06 DIAGNOSIS — R51 Headache: Secondary | ICD-10-CM | POA: Diagnosis not present

## 2015-09-06 DIAGNOSIS — I10 Essential (primary) hypertension: Secondary | ICD-10-CM

## 2015-09-06 DIAGNOSIS — M545 Low back pain, unspecified: Secondary | ICD-10-CM | POA: Diagnosis present

## 2015-09-06 DIAGNOSIS — E860 Dehydration: Secondary | ICD-10-CM | POA: Diagnosis not present

## 2015-09-06 DIAGNOSIS — Z91030 Bee allergy status: Secondary | ICD-10-CM

## 2015-09-06 DIAGNOSIS — M109 Gout, unspecified: Secondary | ICD-10-CM | POA: Diagnosis present

## 2015-09-06 DIAGNOSIS — M1711 Unilateral primary osteoarthritis, right knee: Secondary | ICD-10-CM | POA: Diagnosis present

## 2015-09-06 DIAGNOSIS — R42 Dizziness and giddiness: Secondary | ICD-10-CM | POA: Diagnosis not present

## 2015-09-06 DIAGNOSIS — Z880 Allergy status to penicillin: Secondary | ICD-10-CM

## 2015-09-06 DIAGNOSIS — R404 Transient alteration of awareness: Secondary | ICD-10-CM | POA: Diagnosis not present

## 2015-09-06 DIAGNOSIS — K5900 Constipation, unspecified: Secondary | ICD-10-CM | POA: Diagnosis present

## 2015-09-06 DIAGNOSIS — M542 Cervicalgia: Secondary | ICD-10-CM | POA: Diagnosis not present

## 2015-09-06 DIAGNOSIS — S299XXA Unspecified injury of thorax, initial encounter: Secondary | ICD-10-CM | POA: Diagnosis not present

## 2015-09-06 DIAGNOSIS — Z885 Allergy status to narcotic agent status: Secondary | ICD-10-CM

## 2015-09-06 DIAGNOSIS — Z881 Allergy status to other antibiotic agents status: Secondary | ICD-10-CM

## 2015-09-06 DIAGNOSIS — W19XXXA Unspecified fall, initial encounter: Secondary | ICD-10-CM

## 2015-09-06 DIAGNOSIS — S0990XA Unspecified injury of head, initial encounter: Secondary | ICD-10-CM | POA: Diagnosis not present

## 2015-09-06 NOTE — ED Notes (Signed)
Per EMS:  Pt from home.  Pt had a fall 2 days ago and another today.  Pt now c/o pain to back of head.  Pt c/o weakness, lethargy, malaise, flu like symptoms x 5 days.  Pt sts she feels "cool".

## 2015-09-06 NOTE — ED Provider Notes (Addendum)
CSN: [id number]     Arrival date & time 09/06/15  2306 History  By signing my name below, I, Randa Evens, attest that this documentation has been prepared under the direction and in the presence of Jola Schmidt, MD. Electronically Signed: Randa Evens, ED Scribe. 09/07/2015. 12:09 AM.     Chief Complaint  Patient presents with  . Hypotension  . Fall    The history is provided by the patient. No language interpreter was used.   HPI Comments: Becky Hobbs is a 69 y.o. female brought in by ambulance, who presents to the Emergency Department complaining of fall x2 onset 1 day prior. Pt reports cold symptoms 1 week prior she states that her symptoms included greenish yellow sputum, nausea, vomiting and congestion. Pt states that she has tried OTC cold medicine with no relief of her symptoms. Pt states that the first time she fell she started feeling dizzy prior to falling. She states that when she fell she fell back hitting the back of her head. She reports some soreness on the posterior aspect of her head, neck and upper back. She states that she was able to ambulate with a cane yesterday. Pt state that she fell again this evening causing to hit the back of her head again. Pt states that today she had been in the bed all day due to not feeling well, she states that as soon as she tried to stand up from her bed she felt dizzy, weak and light headed and fell again. Pt states that she was not able to ambulate today without holding on to the wall. Pt reports also having HA as well. Pt reports HX of several back surgeries. Pt does report going to see her back dr earlier this week to receive an injection for the pain.  Hx of anemia    Past Medical History  Diagnosis Date  . Hypertension   . H/O hiatal hernia     surgery fixed  . Diabetes mellitus     borderline  . Arthritis   . Anxiety     panic attacks  . Asthma   . Hypothyroidism   . Heart murmur     MVP  . Blood transfusion      1973--with twins birth   Past Surgical History  Procedure Laterality Date  . Cholecystectomy    . Back surgery    . Tonsillectomy    . Appendectomy    . Abdominal hysterectomy    . Bladder tack    . Breast cyst incision and drainage    . Breast surgery    . Ovarian cyst surgery    . Nasal septum surgery    . Fracture surgery      left wrist  . Nissan fundoplication    . Bunionectomy    . Lumbar laminectomy  03/04/2012    Procedure: MICRODISCECTOMY LUMBAR LAMINECTOMY;  Surgeon: Jessy Oto, MD;  Location: [facility];  Service: Orthopedics;  Laterality: N/A;  L3-4 central laminectomy   History reviewed. No pertinent family history. Social History  Substance Use Topics  . Smoking status: Never Smoker   . Smokeless tobacco: Never Used  . Alcohol Use: No   OB History    No data available     Review of Systems  HENT: Positive for congestion.   Gastrointestinal: Positive for nausea and vomiting.  Musculoskeletal: Positive for myalgias, back pain and neck pain.  Neurological: Positive for dizziness, weakness and headaches.  All other systems reviewed  and are negative.     Allergies  Morphine and related; Other; Erythromycin; Hydrocodone; Penicillins; Tramadol hcl; and Hydrocodone-acetaminophen  Home Medications   Prior to Admission medications   Medication Sig Start Date End Date Taking? Authorizing Provider  albuterol (PROVENTIL HFA;VENTOLIN HFA) 108 (90 BASE) MCG/ACT inhaler Inhale 2 puffs into the lungs every 6 (six) hours as needed for wheezing or shortness of breath. 04/11/15  Yes Melvenia Beam, MD  allopurinol (ZYLOPRIM) 100 MG tablet Take 100 mg by mouth daily.   Yes Historical Provider, MD  ALPRAZolam Duanne Moron) 0.5 MG tablet Take 0.5 mg by mouth daily as needed for anxiety.  02/13/13  Yes Historical Provider, MD  aspirin EC 81 MG tablet Take 81 mg by mouth daily.   Yes Historical Provider, MD  bismuth subsalicylate (PEPTO BISMOL) 262 MG/15ML suspension Take 30 mLs by  mouth every 6 (six) hours as needed for indigestion or diarrhea or loose stools.   Yes Historical Provider, MD  cetirizine (ZYRTEC) 10 MG tablet Take 10 mg by mouth daily.   Yes Historical Provider, MD  Cholecalciferol 2000 UNITS CAPS Take 4,000 Units by mouth daily.   Yes Historical Provider, MD  EPINEPHrine (EPIPEN 2-PAK) 0.3 mg/0.3 mL IJ SOAJ injection Inject 0.3 mLs (0.3 mg total) into the skin once. Patient taking differently: Inject 0.3 mg into the skin daily as needed (allergic reaction).  04/11/15  Yes Melvenia Beam, MD  gabapentin (NEURONTIN) 300 MG capsule Take 600 mg by mouth 3 (three) times daily.    Yes Historical Provider, MD  levothyroxine (SYNTHROID, LEVOTHROID) 75 MCG tablet Take 75 mcg by mouth daily.   Yes Historical Provider, MD  losartan (COZAAR) 50 MG tablet Take 100 mg by mouth daily.    Yes Historical Provider, MD  metoprolol succinate (TOPROL-XL) 50 MG 24 hr tablet Take 100 mg by mouth daily. Take with or immediately following a meal.   Yes Historical Provider, MD  Multiple Vitamin (MULITIVITAMIN WITH MINERALS) TABS Take 1 tablet by mouth daily.   Yes Historical Provider, MD  olopatadine (PATANOL) 0.1 % ophthalmic solution Place 1 drop into both eyes 2 (two) times daily as needed for allergies.    Yes Historical Provider, MD  ondansetron (ZOFRAN) 4 MG tablet Take 1 tablet (4 mg total) by mouth every 8 (eight) hours as needed for nausea. 04/12/13  Yes Marcial Pacas, MD  oxyCODONE-acetaminophen (PERCOCET/ROXICET) 5-325 MG per tablet Take 1 tablet by mouth every 6 (six) hours as needed for moderate pain.  01/25/15  Yes Historical Provider, MD  potassium chloride SA (K-DUR,KLOR-CON) 20 MEQ tablet Take 20 mEq by mouth daily as needed (fluid). Takes with torsemide.   Yes Historical Provider, MD  Probiotic Product (PROBIOTIC PO) Take 1 capsule by mouth daily.   Yes Historical Provider, MD  SUMAtriptan (IMITREX) 50 MG tablet TK 1 T PO QD PRF MIGRAINES 04/02/15  Yes Historical Provider, MD   tiZANidine (ZANAFLEX) 2 MG tablet TK 1 TO 2 T PO Q 6 H PRF SPASM 04/24/15  Yes Historical Provider, MD  torsemide (DEMADEX) 20 MG tablet Take 20 mg by mouth daily as needed (fluid). For edema.   Yes Historical Provider, MD  zolpidem (AMBIEN) 10 MG tablet Take 10 mg by mouth at bedtime as needed for sleep.  04/08/13  Yes Historical Provider, MD   BP 100/37 mmHg  Pulse 60  Temp(Src) 96.2 F (35.7 C) (Oral)  Resp 12  SpO2 99%   Physical Exam  Constitutional: She is oriented to person,  place, and time. She appears well-developed and well-nourished. No distress.  HENT:  Head: Normocephalic and atraumatic.  Eyes: EOM are normal.  Neck: Normal range of motion.  Right paracervical tenderness. No cervical tenderness.   Cardiovascular: Normal rate, regular rhythm and normal heart sounds.   Pulmonary/Chest: Effort normal and breath sounds normal.  Abdominal: Soft. She exhibits no distension. There is no tenderness.  Musculoskeletal: Normal range of motion.  Neurological: She is alert and oriented to person, place, and time.  Skin: Skin is warm and dry.  Psychiatric: She has a normal mood and affect. Judgment normal.  Nursing note and vitals reviewed.   ED Course  Procedures (including critical care time) DIAGNOSTIC STUDIES: Oxygen Saturation is 99% on RA, normal by my interpretation.    COORDINATION OF CARE: 1:21 AM-Discussed treatment plan with pt at bedside and pt agreed to plan.     Labs Review Labs Reviewed  CBC WITH DIFFERENTIAL/PLATELET - Abnormal; Notable for the following:    WBC 10.6 (*)    Hemoglobin 11.7 (*)    All other components within normal limits  COMPREHENSIVE METABOLIC PANEL - Abnormal; Notable for the following:    Sodium 133 (*)    Chloride 100 (*)    Glucose, Bld 120 (*)    BUN 41 (*)    Creatinine, Ser 2.70 (*)    GFR calc non Af Amer 17 (*)    GFR calc Af Amer 20 (*)    All other components within normal limits  CULTURE, BLOOD (ROUTINE X 2)  CULTURE,  BLOOD (ROUTINE X 2)  TROPONIN I  URINALYSIS, ROUTINE W REFLEX MICROSCOPIC (NOT AT [facility])  I-STAT CG4 LACTIC ACID, ED  I-STAT CG4 LACTIC ACID, ED   BUN  Date Value Ref Range Status  09/07/2015 41* 6 - 20 mg/dL Final  03/15/2013 11 6 - 23 mg/dL Final  03/02/2012 21 6 - 23 mg/dL Final  11/06/2010 10 6 - 23 mg/dL Final   CREATININE, SER  Date Value Ref Range Status  09/07/2015 2.70* 0.44 - 1.00 mg/dL Final  03/15/2013 1.31* 0.50 - 1.10 mg/dL Final  03/02/2012 1.46* 0.50 - 1.10 mg/dL Final  11/06/2010 1.12 0.4 - 1.2 mg/dL Final       Imaging Review Dg Chest 2 View  09/07/2015  CLINICAL DATA:  Per EMS: Pt from home. Pt had a fall 2 days ago and another today. Pt now c/o pain to back of head. Pt c/o weakness, lethargy, malaise, flu like symptoms x 5 days. Pt also states that she has been "passing out" for the last few days. EXAM: CHEST  2 VIEW COMPARISON:  09/09/2010 FINDINGS: Shallow inspiration. Cardiac enlargement. No pulmonary vascular congestion. Linear atelectasis in the lung bases. No focal airspace disease or consolidation in the lungs. No blunting of costophrenic angles. No pneumothorax. Tortuous aorta. Surgical clips in the right upper quadrant. IMPRESSION: Shallow inspiration. Cardiac enlargement. No evidence of active pulmonary disease. Electronically Signed   By: Lucienne Capers M.D.   On: 09/07/2015 01:18   Ct Head Wo Contrast  09/07/2015  CLINICAL DATA:  Status post fall, with pain at the back of the head. Weakness, lethargy abnormalities. Initial encounter. EXAM: CT HEAD WITHOUT CONTRAST CT CERVICAL SPINE WITHOUT CONTRAST TECHNIQUE: Multidetector CT imaging of the head and cervical spine was performed following the standard protocol without intravenous contrast. Multiplanar CT image reconstructions of the cervical spine were also generated. COMPARISON:  CT of the head performed 03/15/2013, and MRI of the cervical spine performed  07/01/2012 FINDINGS: CT HEAD FINDINGS There is  no evidence of acute infarction, mass lesion, or intra- or extra-axial hemorrhage on CT. Prominence of the sulci suggests mild cortical volume loss. The brainstem and fourth ventricle are within normal limits. The basal ganglia are unremarkable in appearance. The cerebral hemispheres demonstrate grossly normal gray-white differentiation. No mass effect or midline shift is seen. There is no evidence of fracture; visualized osseous structures are unremarkable in appearance. The orbits are within normal limits. The paranasal sinuses and mastoid air cells are well-aerated. No significant soft tissue abnormalities are seen. CT CERVICAL SPINE FINDINGS There is no evidence of acute fracture or subluxation. There is grade 1 anterolisthesis of C3 on C4, and of C4 on C5. 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 Vertebral bodies demonstrate normal height. Prevertebral soft tissues are within normal limits. The thyroid gland is unremarkable in appearance. The visualized lung apices are clear. No significant soft tissue abnormalities are seen. IMPRESSION: 1. No evidence of traumatic intracranial injury or fracture. 2. No evidence of acute fracture or subluxation along the cervical spine. 3. Mild degenerative change along the cervical spine. 4. Mild cortical volume loss noted. Electronically Signed   By: Garald Balding M.D.   On: 09/07/2015 00:51   Ct Cervical Spine Wo Contrast  09/07/2015  CLINICAL DATA:  Status post fall, with pain at the back of the head. Weakness, lethargy abnormalities. Initial encounter. EXAM: CT HEAD WITHOUT CONTRAST CT CERVICAL SPINE WITHOUT CONTRAST TECHNIQUE: Multidetector CT imaging of the head and cervical spine was performed following the standard protocol without intravenous contrast. Multiplanar CT image reconstructions of the  cervical spine were also generated. COMPARISON:  CT of the head performed 03/15/2013, and MRI of the cervical spine performed 07/01/2012 FINDINGS: CT HEAD FINDINGS There is no evidence of acute infarction, mass lesion, or intra- or extra-axial hemorrhage on CT. Prominence of the sulci suggests mild cortical volume loss. The brainstem and fourth ventricle are within normal limits. The basal ganglia are unremarkable in appearance. The cerebral hemispheres demonstrate grossly normal gray-white differentiation. No mass effect or midline shift is seen. There is no evidence of fracture; visualized osseous structures are unremarkable in appearance. The orbits are within normal limits. The paranasal sinuses and mastoid air cells are well-aerated. No significant soft tissue abnormalities are seen. CT CERVICAL SPINE FINDINGS There is no evidence of acute fracture or subluxation. There is grade 1 anterolisthesis of C3 on C4, and of C4 on C5. 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 Vertebral bodies demonstrate normal height. Prevertebral soft tissues are within normal limits. The thyroid gland is unremarkable in appearance. The visualized lung apices are clear. No significant soft tissue abnormalities are seen. IMPRESSION: 1. No evidence of traumatic intracranial injury or fracture. 2. No evidence of acute fracture or subluxation along the cervical spine. 3. Mild degenerative change along the cervical spine. 4. Mild cortical volume loss noted. Electronically Signed   By: Garald Balding M.D.   On: 09/07/2015 00:51   I personally reviewed the imaging tests through PACS system I reviewed available ER/hospitalization records through the EMR    EKG Interpretation   Date/Time:  Saturday September 07 2015 03:17:24 EST Ventricular Rate:  62 PR Interval:  152 QRS  Duration: 83 QT Interval:  441 QTC Calculation: 448 R Axis:   22 Text Interpretation:  Sinus rhythm Probable left atrial enlargement No  significant change was found Confirmed by Catherin Doorn  MD, Remberto Lienhard ([id number]) on  09/07/2015 3:31:55 AM      MDM   Final diagnoses:  AKI (acute kidney injury) ([place])  Dehydration  Fall, initial encounter       Patient with dehydration and acute kidney injury.  Creatinine today is 2.7.  This is up from her baseline at around 1.3-1.4.  Patient be admitted the hospital for ongoing IV fluids.  Orthostasis is what likely attributed to her falls today.  CT imaging negative for acute traumatic injury.  Admit to hospital.  IV fluids now.  Family updated.    I personally performed the services described in this documentation, which was scribed in my presence. The recorded information has been reviewed and is accurate.        Jola Schmidt, MD 09/07/15 [place], MD 09/07/15 [phone]

## 2015-09-07 ENCOUNTER — Inpatient Hospital Stay (HOSPITAL_COMMUNITY): Payer: Medicare Other

## 2015-09-07 ENCOUNTER — Encounter (HOSPITAL_COMMUNITY): Payer: Self-pay | Admitting: Family Medicine

## 2015-09-07 ENCOUNTER — Emergency Department (HOSPITAL_COMMUNITY): Payer: Medicare Other

## 2015-09-07 DIAGNOSIS — I10 Essential (primary) hypertension: Secondary | ICD-10-CM | POA: Diagnosis not present

## 2015-09-07 DIAGNOSIS — N189 Chronic kidney disease, unspecified: Secondary | ICD-10-CM | POA: Diagnosis not present

## 2015-09-07 DIAGNOSIS — E039 Hypothyroidism, unspecified: Secondary | ICD-10-CM

## 2015-09-07 DIAGNOSIS — S199XXA Unspecified injury of neck, initial encounter: Secondary | ICD-10-CM | POA: Diagnosis not present

## 2015-09-07 DIAGNOSIS — M109 Gout, unspecified: Secondary | ICD-10-CM

## 2015-09-07 DIAGNOSIS — M1711 Unilateral primary osteoarthritis, right knee: Secondary | ICD-10-CM | POA: Diagnosis present

## 2015-09-07 DIAGNOSIS — M25561 Pain in right knee: Secondary | ICD-10-CM | POA: Diagnosis not present

## 2015-09-07 DIAGNOSIS — M542 Cervicalgia: Secondary | ICD-10-CM | POA: Diagnosis not present

## 2015-09-07 DIAGNOSIS — E860 Dehydration: Secondary | ICD-10-CM | POA: Diagnosis present

## 2015-09-07 DIAGNOSIS — Z880 Allergy status to penicillin: Secondary | ICD-10-CM | POA: Diagnosis not present

## 2015-09-07 DIAGNOSIS — Z881 Allergy status to other antibiotic agents status: Secondary | ICD-10-CM | POA: Diagnosis not present

## 2015-09-07 DIAGNOSIS — Z794 Long term (current) use of insulin: Secondary | ICD-10-CM | POA: Diagnosis not present

## 2015-09-07 DIAGNOSIS — Z841 Family history of disorders of kidney and ureter: Secondary | ICD-10-CM | POA: Diagnosis not present

## 2015-09-07 DIAGNOSIS — Z91030 Bee allergy status: Secondary | ICD-10-CM | POA: Diagnosis not present

## 2015-09-07 DIAGNOSIS — K5900 Constipation, unspecified: Secondary | ICD-10-CM | POA: Diagnosis present

## 2015-09-07 DIAGNOSIS — M545 Low back pain, unspecified: Secondary | ICD-10-CM | POA: Diagnosis present

## 2015-09-07 DIAGNOSIS — E861 Hypovolemia: Secondary | ICD-10-CM | POA: Diagnosis present

## 2015-09-07 DIAGNOSIS — N179 Acute kidney failure, unspecified: Secondary | ICD-10-CM | POA: Diagnosis not present

## 2015-09-07 DIAGNOSIS — Z7982 Long term (current) use of aspirin: Secondary | ICD-10-CM | POA: Diagnosis not present

## 2015-09-07 DIAGNOSIS — E871 Hypo-osmolality and hyponatremia: Secondary | ICD-10-CM | POA: Diagnosis present

## 2015-09-07 DIAGNOSIS — R51 Headache: Secondary | ICD-10-CM | POA: Diagnosis not present

## 2015-09-07 DIAGNOSIS — S299XXA Unspecified injury of thorax, initial encounter: Secondary | ICD-10-CM | POA: Diagnosis not present

## 2015-09-07 DIAGNOSIS — D631 Anemia in chronic kidney disease: Secondary | ICD-10-CM | POA: Diagnosis present

## 2015-09-07 DIAGNOSIS — N390 Urinary tract infection, site not specified: Secondary | ICD-10-CM | POA: Diagnosis present

## 2015-09-07 DIAGNOSIS — I129 Hypertensive chronic kidney disease with stage 1 through stage 4 chronic kidney disease, or unspecified chronic kidney disease: Secondary | ICD-10-CM | POA: Diagnosis present

## 2015-09-07 DIAGNOSIS — R079 Chest pain, unspecified: Secondary | ICD-10-CM | POA: Diagnosis not present

## 2015-09-07 DIAGNOSIS — Z886 Allergy status to analgesic agent status: Secondary | ICD-10-CM | POA: Diagnosis not present

## 2015-09-07 DIAGNOSIS — R7303 Prediabetes: Secondary | ICD-10-CM | POA: Diagnosis present

## 2015-09-07 DIAGNOSIS — R531 Weakness: Secondary | ICD-10-CM | POA: Diagnosis not present

## 2015-09-07 DIAGNOSIS — S8991XA Unspecified injury of right lower leg, initial encounter: Secondary | ICD-10-CM | POA: Diagnosis not present

## 2015-09-07 DIAGNOSIS — S0990XA Unspecified injury of head, initial encounter: Secondary | ICD-10-CM | POA: Diagnosis not present

## 2015-09-07 DIAGNOSIS — Z885 Allergy status to narcotic agent status: Secondary | ICD-10-CM | POA: Diagnosis not present

## 2015-09-07 LAB — GLUCOSE, CAPILLARY
Glucose-Capillary: 113 mg/dL — ABNORMAL HIGH (ref 65–99)
Glucose-Capillary: 104 mg/dL — ABNORMAL HIGH (ref 65–99)
Glucose-Capillary: 126 mg/dL — ABNORMAL HIGH (ref 65–99)
Glucose-Capillary: 104 mg/dL — ABNORMAL HIGH (ref 65–99)

## 2015-09-07 LAB — CBC WITH DIFFERENTIAL/PLATELET
Basophils Absolute: 0 10*3/uL (ref 0.0–0.1)
Basophils Relative: 0 %
Eosinophils Absolute: 0.5 10*3/uL (ref 0.0–0.7)
Eosinophils Relative: 4 %
HCT: 36.7 % (ref 36.0–46.0)
Hemoglobin: 11.7 g/dL — ABNORMAL LOW (ref 12.0–15.0)
Lymphocytes Relative: 20 %
Lymphs Abs: 2.1 10*3/uL (ref 0.7–4.0)
MCH: 29.8 pg (ref 26.0–34.0)
MCHC: 31.9 g/dL (ref 30.0–36.0)
MCV: 93.4 fL (ref 78.0–100.0)
Monocytes Absolute: 0.7 10*3/uL (ref 0.1–1.0)
Monocytes Relative: 7 %
Neutro Abs: 7.4 10*3/uL (ref 1.7–7.7)
Neutrophils Relative %: 69 %
Platelets: 208 10*3/uL (ref 150–400)
RBC: 3.93 MIL/uL (ref 3.87–5.11)
RDW: 13.7 % (ref 11.5–15.5)
WBC: 10.6 10*3/uL — ABNORMAL HIGH (ref 4.0–10.5)

## 2015-09-07 LAB — COMPREHENSIVE METABOLIC PANEL
ALT: 21 U/L (ref 14–54)
AST: 25 U/L (ref 15–41)
Albumin: 3.5 g/dL (ref 3.5–5.0)
Alkaline Phosphatase: 87 U/L (ref 38–126)
Anion gap: 10 (ref 5–15)
BUN: 41 mg/dL — ABNORMAL HIGH (ref 6–20)
CO2: 23 mmol/L (ref 22–32)
Calcium: 9.1 mg/dL (ref 8.9–10.3)
Chloride: 100 mmol/L — ABNORMAL LOW (ref 101–111)
Creatinine, Ser: 2.7 mg/dL — ABNORMAL HIGH (ref 0.44–1.00)
GFR calc Af Amer: 20 mL/min — ABNORMAL LOW (ref >60)
GFR calc non Af Amer: 17 mL/min — ABNORMAL LOW (ref >60)
Glucose, Bld: 120 mg/dL — ABNORMAL HIGH (ref 65–99)
Potassium: 3.9 mmol/L (ref 3.5–5.1)
Sodium: 133 mmol/L — ABNORMAL LOW (ref 135–145)
Total Bilirubin: 0.5 mg/dL (ref 0.3–1.2)
Total Protein: 6.8 g/dL (ref 6.5–8.1)

## 2015-09-07 LAB — URINALYSIS, ROUTINE W REFLEX MICROSCOPIC
Bilirubin Urine: NEGATIVE
Glucose, UA: NEGATIVE mg/dL
Ketones, ur: NEGATIVE mg/dL
Nitrite: POSITIVE — AB
Protein, ur: NEGATIVE mg/dL
Specific Gravity, Urine: 1.006 (ref 1.005–1.030)
pH: 6 (ref 5.0–8.0)

## 2015-09-07 LAB — CREATININE, URINE, RANDOM: Creatinine, Urine: 59.99 mg/dL

## 2015-09-07 LAB — I-STAT CG4 LACTIC ACID, ED
Lactic Acid, Venous: 0.97 mmol/L (ref 0.5–2.0)
Lactic Acid, Venous: 1.14 mmol/L (ref 0.5–2.0)

## 2015-09-07 LAB — URINE MICROSCOPIC-ADD ON

## 2015-09-07 LAB — TROPONIN I: Troponin I: <0.03 ng/mL (ref <0.031)

## 2015-09-07 LAB — SODIUM, URINE, RANDOM: Sodium, Ur: 36 mmol/L

## 2015-09-07 MED ORDER — ALLOPURINOL 100 MG PO TABS
100.0000 mg | ORAL_TABLET | Freq: Every day | ORAL | Status: DC
  Administered 2015-09-08 – 2015-09-10 (×3): 100 mg via ORAL
  Filled 2015-09-07 (×3): qty 1

## 2015-09-07 MED ORDER — SODIUM CHLORIDE 0.9 % IV SOLN
INTRAVENOUS | Status: AC
  Administered 2015-09-07: 05:00:00 via INTRAVENOUS

## 2015-09-07 MED ORDER — SODIUM CHLORIDE 0.9 % IV SOLN
1000.0000 mL | Freq: Once | INTRAVENOUS | Status: AC
  Administered 2015-09-07: 1000 mL via INTRAVENOUS

## 2015-09-07 MED ORDER — ACETAMINOPHEN 325 MG PO TABS
650.0000 mg | ORAL_TABLET | Freq: Once | ORAL | Status: AC
  Administered 2015-09-07: 650 mg via ORAL
  Filled 2015-09-07: qty 2

## 2015-09-07 MED ORDER — ZOLPIDEM TARTRATE 5 MG PO TABS
5.0000 mg | ORAL_TABLET | Freq: Every evening | ORAL | Status: DC | PRN
Start: 2015-09-07 — End: 2015-09-10

## 2015-09-07 MED ORDER — TIZANIDINE HCL 4 MG PO TABS: 2.0000 mg | ORAL_TABLET | Freq: Three times a day (TID) | ORAL | Status: DC | PRN

## 2015-09-07 MED ORDER — SODIUM CHLORIDE 0.9 % IV SOLN
1000.0000 mL | INTRAVENOUS | Status: DC
  Administered 2015-09-07 – 2015-09-08 (×3): 1000 mL via INTRAVENOUS

## 2015-09-07 MED ORDER — SODIUM CHLORIDE 0.9 % IV BOLUS (SEPSIS)
1500.0000 mL | Freq: Once | INTRAVENOUS | Status: AC
  Administered 2015-09-07: 1500 mL via INTRAVENOUS

## 2015-09-07 MED ORDER — SODIUM CHLORIDE 0.9 % IJ SOLN
3.0000 mL | Freq: Two times a day (BID) | INTRAMUSCULAR | Status: DC
  Administered 2015-09-08 – 2015-09-09 (×3): 3 mL via INTRAVENOUS

## 2015-09-07 MED ORDER — OXYCODONE-ACETAMINOPHEN 5-325 MG PO TABS
1.0000 | ORAL_TABLET | Freq: Four times a day (QID) | ORAL | Status: DC | PRN
  Administered 2015-09-07 – 2015-09-10 (×8): 1 via ORAL
  Filled 2015-09-07 (×8): qty 1

## 2015-09-07 MED ORDER — ONDANSETRON HCL 4 MG/2ML IJ SOLN: 4.0000 mg | Freq: Three times a day (TID) | INTRAMUSCULAR | Status: AC | PRN

## 2015-09-07 MED ORDER — METOPROLOL SUCCINATE ER 100 MG PO TB24
100.0000 mg | ORAL_TABLET | Freq: Every day | ORAL | Status: DC
  Administered 2015-09-07 – 2015-09-10 (×4): 100 mg via ORAL
  Filled 2015-09-07 (×4): qty 1

## 2015-09-07 MED ORDER — CIPROFLOXACIN IN D5W 200 MG/100ML IV SOLN
200.0000 mg | INTRAVENOUS | Status: DC
  Administered 2015-09-07: 200 mg via INTRAVENOUS
  Filled 2015-09-07: qty 100

## 2015-09-07 MED ORDER — LEVOTHYROXINE SODIUM 75 MCG PO TABS
75.0000 ug | ORAL_TABLET | Freq: Every day | ORAL | Status: DC
  Administered 2015-09-07 – 2015-09-10 (×4): 75 ug via ORAL
  Filled 2015-09-07 (×4): qty 1

## 2015-09-07 MED ORDER — ALPRAZOLAM 0.5 MG PO TABS
0.5000 mg | ORAL_TABLET | Freq: Every day | ORAL | Status: DC | PRN
  Administered 2015-09-07: 0.5 mg via ORAL
  Filled 2015-09-07: qty 1

## 2015-09-07 MED ORDER — CIPROFLOXACIN HCL 500 MG PO TABS
250.0000 mg | ORAL_TABLET | ORAL | Status: DC
  Administered 2015-09-08 – 2015-09-10 (×3): 250 mg via ORAL
  Filled 2015-09-07 (×3): qty 1

## 2015-09-07 MED ORDER — INSULIN ASPART 100 UNIT/ML ~~LOC~~ SOLN: 0.0000 [IU] | Freq: Three times a day (TID) | SUBCUTANEOUS | Status: DC

## 2015-09-07 MED ORDER — ASPIRIN EC 81 MG PO TBEC
81.0000 mg | DELAYED_RELEASE_TABLET | Freq: Every day | ORAL | Status: DC
  Administered 2015-09-07 – 2015-09-10 (×4): 81 mg via ORAL
  Filled 2015-09-07 (×4): qty 1

## 2015-09-07 MED ORDER — ONDANSETRON HCL 4 MG/2ML IJ SOLN
4.0000 mg | Freq: Once | INTRAMUSCULAR | Status: AC
Start: 2015-09-07 — End: 2015-09-07
  Administered 2015-09-07: 4 mg via INTRAVENOUS
  Filled 2015-09-07: qty 2

## 2015-09-07 MED ORDER — ENOXAPARIN SODIUM 30 MG/0.3ML ~~LOC~~ SOLN
30.0000 mg | SUBCUTANEOUS | Status: DC
  Administered 2015-09-07 – 2015-09-10 (×4): 30 mg via SUBCUTANEOUS
  Filled 2015-09-07 (×4): qty 0.3
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
## 2015-09-07 NOTE — H&P (Addendum)
History and Physical  Patient Name: Becky Hobbs     [id number]    DOB: March 14, 1946    DOA: 09/06/2015 Referring physician: Jola Schmidt, MD PCP: Marton Redwood, MD      Chief Complaint: Fall  HPI: Becky Hobbs is a 69 y.o. female with a past medical history significant for HTN, CKD, and hypothyroidism who presents with fall and acute on chronic kidney injury.  The patient was in her usual state of health until a few days ago when she started "coming down with something". Her sister and her niece have both been sick with URIs and the patient was feeling malaise, URI symptoms, and fatigue for a few days. Today she went to bed around 4 AM and slept all day. In the afternoon she got up around 6:30 PM and when she stood up to go to the bathroom she felt dizzy, blacked out and hit her head.  In the ED, the patient was hemodynamically stable and saturating well on ambient air. She had no leukocytosis and her lactic acid level was normal. CT of the head was normal. A chest x-ray was clear. Her serum creatinine was 2.7 mg/dL with a last known previous of 1.5 mg/dL, and somewhat elevated BUN to creatinine ratio, so [facility] were asked to admit for acute on chronic kidney injury.     Review of Systems:  Pt complains of malaise, fatigue, presyncope. Pt denies any fever, chills, sweats, cough, sputum, dysuria, hematuria, urinary urgency.  All other systems negative except as just noted or noted in the history of present illness.   Allergies:  Allergies  Allergen Reactions  . Morphine And Related Itching and Other (See Comments)    Hives (moderate severity)  . Other Anaphylaxis    bees  . Erythromycin Hives and Itching  . Hydrocodone Itching  . Penicillins Hives  . Tramadol Hcl Itching  . Hydrocodone-Acetaminophen Itching    Pt takes medication    Home medications: 1. Allopurinol 100 mg daily 2. Aspirin 81 mg daily 3. Alprazolam 0.5 mg as needed 4. Gabapentin 600 mg 3 times  daily 5. Levothyroxine 75 g daily 6. Losartan 100 mg daily 7. Metoprolol succinate 100 mg daily 8. Percocet once or twice per day as needed 9. Tizanidine when necessary 10. Zolpidem 10 mg as needed  Past medical history: 1. HTN 2. CKD, baseline creatinine is not clear 3. Gout 4. Hypothyroidism  Family history:  Mother, kidney disease, gangrene.   Social History:  Patient lives by herself. She is a never smoker. She does not walk with a cane, but has a walker that she uses when she is unsteady like this week.        Physical Exam: BP 122/41 mmHg  Pulse 62  Temp(Src) 97.5 F (36.4 C) (Oral)  Resp 18  SpO2 100% General appearance: Well-developed, adult female, tired appearing but in no acute distress.   Eyes: Anicteric, conjunctiva pink, lids and lashes normal.     ENT: No nasal discharge.  OP moist without lesions.   Skin: Warm and dry.   Cardiac: RRR, nl S1-S2, no murmurs appreciated.   Respiratory: Normal respiratory effort.  CTAB without rales or wheezes. Abdomen: Abdomen soft without rigidity.  No TTP. No ascites, distension.   MSK: No deformities or effusions. Neuro: Sensorium intact and responding to questions, attention normal.  Speech is fluent.  Moves all extremities equally and with normal coordination.    Psych: Behavior appropriate.  Affect normal.  No evidence of  aural or visual hallucinations or delusions.       Labs on Admission:  The metabolic panel shows hyponatremia, elevated creatinine, elevated BUN. The transaminases and bilirubin are normal. The troponin is negative. The lactic acid level is normal. The complete blood count shows no leukocytosis. Mild anemia stable.   Radiological Exams on Admission: Personally reviewed: Dg Chest 2 View  09/07/2015  CLINICAL DATA:  Per EMS: Pt from home. Pt had a fall 2 days ago and another today. Pt now c/o pain to back of head. Pt c/o weakness, lethargy, malaise, flu like symptoms x 5 days. Pt also  states that she has been "passing out" for the last few days. EXAM: CHEST  2 VIEW COMPARISON:  09/09/2010 FINDINGS: Shallow inspiration. Cardiac enlargement. No pulmonary vascular congestion. Linear atelectasis in the lung bases. No focal airspace disease or consolidation in the lungs. No blunting of costophrenic angles. No pneumothorax. Tortuous aorta. Surgical clips in the right upper quadrant. IMPRESSION: Shallow inspiration. Cardiac enlargement. No evidence of active pulmonary disease. Electronically Signed   By: Lucienne Capers M.D.   On: 09/07/2015 01:18   Ct Head Wo Contrast  09/07/2015  CLINICAL DATA:  Status post fall, with pain at the back of the head. Weakness, lethargy abnormalities. Initial encounter. EXAM: CT HEAD WITHOUT CONTRAST CT CERVICAL SPINE WITHOUT CONTRAST TECHNIQUE: Multidetector CT imaging of the head and cervical spine was performed following the standard protocol without intravenous contrast. Multiplanar CT image reconstructions of the cervical spine were also generated. COMPARISON:  CT of the head performed 03/15/2013, and MRI of the cervical spine performed 07/01/2012 FINDINGS: CT HEAD FINDINGS There is no evidence of acute infarction, mass lesion, or intra- or extra-axial hemorrhage on CT. Prominence of the sulci suggests mild cortical volume loss. The brainstem and fourth ventricle are within normal limits. The basal ganglia are unremarkable in appearance. The cerebral hemispheres demonstrate grossly normal gray-white differentiation. No mass effect or midline shift is seen. There is no evidence of fracture; visualized osseous structures are unremarkable in appearance. The orbits are within normal limits. The paranasal sinuses and mastoid air cells are well-aerated. No significant soft tissue abnormalities are seen. CT CERVICAL SPINE FINDINGS There is no evidence of acute fracture or subluxation. There is grade 1 anterolisthesis of C3 on C4, and of C4 on C5. There is chronic  osseous fusion at C5-C6, and multilevel disc space narrowing along the lower cervical spine, with scattered anterior and posterior disc osteophyte complexes, and underlying facet disease. Vertebral bodies demonstrate normal height. Prevertebral soft tissues are within normal limits. The thyroid gland is unremarkable in appearance. The visualized lung apices are clear. No significant soft tissue abnormalities are seen. IMPRESSION: 1. No evidence of traumatic intracranial injury or fracture. 2. No evidence of acute fracture or subluxation along the cervical spine. 3. Mild degenerative change along the cervical spine. 4. Mild cortical volume loss noted. Electronically Signed   By: Garald Balding M.D.   On: 09/07/2015 00:51   Ct Cervical Spine Wo Contrast  09/07/2015  CLINICAL DATA:  Status post fall, with pain at the back of the head. Weakness, lethargy abnormalities. Initial encounter. EXAM: CT HEAD WITHOUT CONTRAST CT CERVICAL SPINE WITHOUT CONTRAST TECHNIQUE: Multidetector CT imaging of the head and cervical spine was performed following the standard protocol without intravenous contrast. Multiplanar CT image reconstructions of the cervical spine were also generated. COMPARISON:  CT of the head performed 03/15/2013, and MRI of the cervical spine performed 07/01/2012 FINDINGS: CT HEAD FINDINGS There  is no evidence of acute infarction, mass lesion, or intra- or extra-axial hemorrhage on CT. Prominence of the sulci suggests mild cortical volume loss. The brainstem and fourth ventricle are within normal limits. The basal ganglia are unremarkable in appearance. The cerebral hemispheres demonstrate grossly normal gray-white differentiation. No mass effect or midline shift is seen. There is no evidence of fracture; visualized osseous structures are unremarkable in appearance. The orbits are within normal limits. The paranasal sinuses and mastoid air cells are well-aerated. No significant soft tissue abnormalities are  seen. CT CERVICAL SPINE FINDINGS There is no evidence of acute fracture or subluxation. There is grade 1 anterolisthesis of C3 on C4, and of C4 on C5. 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 Vertebral bodies demonstrate normal height. Prevertebral soft tissues are within normal limits. The thyroid gland is unremarkable in appearance. The visualized lung apices are clear. No significant soft tissue abnormalities are seen. IMPRESSION: 1. No evidence of traumatic intracranial injury or fracture. 2. No evidence of acute fracture or subluxation along the cervical spine. 3. Mild degenerative change along the cervical spine. 4. Mild cortical volume loss noted. Electronically Signed   By: Garald Balding M.D.   On: 09/07/2015 00:51        Assessment/Plan 1. Acute on chronic kidney injury:  This is new.  The patient's baseline creatinine is not known, but her last available measurement in [facility] from 2015 was 1.5 mg/dL.    -Fluid resuscitation -Trend BMP -Urine electrolytes     2. UTI:  This is new.   -Ciprofloxacin IV renally dosed -Follow urine culture  3. Hyponatremia:  Likely from hypovolemia.  -Trend BMP  4. Anemia of renal disease:  Chronic, stable.      DVT PPx: Lovenox Diet: Regular Consultants: None Code Status: Full Family Communication: Sister and nieces present at the bedside  Medical decision making: What exists of the patient's previous chart was reviewed in depth and the case was discussed with Dr. Venora Maples. Patient seen 3:50 AM on 09/07/2015.  Disposition Plan:  Admit for fluid resuscitation and trend BMP.  Treat UTI.  Anticipate 2-3 days hospitalization.      Edwin Dada [facility] Pager [phone]

## 2015-09-07 NOTE — Progress Notes (Signed)
Pt admitted to room 512 alert and oriented x 4 c/o pain to head, and right knee. Vss. Pt instructed to call for assostance. Pt walked with walker with sba to b/r.A wound, pt states was from a heating pad to upper back with silvadene cream and dry dressing noted. See wound assessment. Family at bedside.

## 2015-09-07 NOTE — Evaluation (Signed)
Physical Therapy Evaluation Patient Details Name: Becky Hobbs MRN: [id number] DOB: Jul 19, 1946 Today's Date: 09/07/2015   History of Present Illness  Pt is a 69 y./o female with HTN admitted after fall due to syncope and chronic kidney injury.  Pt sustained multiple bumps and bruises, including R knee which was neg for fx on x ray.  Work up continues.  Clinical Impression  Pt admitted with/for fall and multiple injuries.  Pt currently limited functionally due to the problems listed below.  (see problems list.)  Pt will benefit from PT to maximize function and safety to be able to get home safely with available assist of family.     Follow Up Recommendations No PT follow up;Supervision - Intermittent    Equipment Recommendations   (TBA)    Recommendations for Other Services       Precautions / Restrictions Precautions Precautions: Fall      Mobility  Bed Mobility Overal bed mobility: Needs Assistance Bed Mobility: Supine to Sit     Supine to sit: HOB elevated;Min guard (rail)        Transfers   Equipment used: Rolling walker (2 wheeled)             General transfer comment: cues for hand placement and "leg out" before sitting  Ambulation/Gait Ambulation/Gait assistance: Min guard Ambulation Distance (Feet): 50 Feet Assistive device: Rolling walker (2 wheeled) Gait Pattern/deviations: Step-through pattern;Antalgic;Step-to pattern Gait velocity: slow Gait velocity interpretation: Below normal speed for age/gender General Gait Details: safe, but guarded and antalgic  Stairs            Wheelchair Mobility    Modified Rankin (Stroke Patients Only)       Balance Overall balance assessment: No apparent balance deficits (not formally assessed)                                           Pertinent Vitals/Pain Pain Assessment: Faces Faces Pain Scale: Hurts even more Pain Location: R knee, left buttock and back of head Pain  Descriptors / Indicators: Aching;Grimacing;Sore Pain Intervention(s): Monitored during session;Limited activity within patient's tolerance    Home Living Family/patient expects to be discharged to:: Private residence Living Arrangements: Alone Available Help at Discharge: Family;Available PRN/intermittently Type of Home: House Home Access: Stairs to enter Entrance Stairs-Rails: Psychiatric nurse of Steps: several Home Layout: Two level        Prior Function Level of Independence: Independent               Hand Dominance        Extremity/Trunk Assessment   Upper Extremity Assessment: Overall WFL for tasks assessed           Lower Extremity Assessment: RLE deficits/detail;LLE deficits/detail RLE Deficits / Details: functional, but not formally test due to knee inury LLE Deficits / Details: WFL     Communication   Communication: No difficulties  Cognition Arousal/Alertness: Awake/alert Behavior During Therapy: WFL for tasks assessed/performed Overall Cognitive Status: Within Functional Limits for tasks assessed                      General Comments      Exercises        Assessment/Plan    PT Assessment Patient needs continued PT services  PT Diagnosis Difficulty walking;Acute pain   PT Problem List Decreased activity tolerance;Decreased mobility;Decreased strength;Pain;Decreased knowledge  of use of DME  PT Treatment Interventions Functional mobility training;Therapeutic activities;Therapeutic exercise;Patient/family education;Gait training;DME instruction   PT Goals (Current goals can be found in the Care Plan section) Acute Rehab PT Goals Patient Stated Goal: home independent PT Goal Formulation: With patient Time For Goal Achievement: 09/14/15 Potential to Achieve Goals: Good    Frequency Min 3X/week   Barriers to discharge Decreased caregiver support (sister family PRN)      Co-evaluation               End of  Session   Activity Tolerance: Patient limited by pain;Patient tolerated treatment well Patient left: in bed;with call bell/phone within reach;Other (comment);with family/visitor present (sitting EOB) Nurse Communication: Mobility status         Time: [id number] PT Time Calculation (min) (ACUTE ONLY): 39 min   Charges:   PT Evaluation $Initial PT Evaluation Tier I: 1 Procedure PT Treatments $Gait Training: 8-22 mins $Therapeutic Activity: 8-22 mins   PT G Codes:        Kroy Sprung, Tessie Fass 09/07/2015, 5:43 PM 09/07/2015  Donnella Sham, [place] [phone]  (pager)

## 2015-09-08 LAB — GLUCOSE, CAPILLARY
Glucose-Capillary: 103 mg/dL — ABNORMAL HIGH (ref 65–99)
Glucose-Capillary: 94 mg/dL (ref 65–99)

## 2015-09-08 LAB — COMPREHENSIVE METABOLIC PANEL
ALT: 21 U/L (ref 14–54)
AST: 26 U/L (ref 15–41)
Albumin: 2.8 g/dL — ABNORMAL LOW (ref 3.5–5.0)
Alkaline Phosphatase: 74 U/L (ref 38–126)
Anion gap: 8 (ref 5–15)
BUN: 24 mg/dL — ABNORMAL HIGH (ref 6–20)
CO2: 22 mmol/L (ref 22–32)
Calcium: 8.8 mg/dL — ABNORMAL LOW (ref 8.9–10.3)
Chloride: 110 mmol/L (ref 101–111)
Creatinine, Ser: 1.7 mg/dL — ABNORMAL HIGH (ref 0.44–1.00)
GFR calc Af Amer: 35 mL/min — ABNORMAL LOW (ref >60)
GFR calc non Af Amer: 30 mL/min — ABNORMAL LOW (ref >60)
Glucose, Bld: 117 mg/dL — ABNORMAL HIGH (ref 65–99)
Potassium: 4.2 mmol/L (ref 3.5–5.1)
Sodium: 140 mmol/L (ref 135–145)
Total Bilirubin: 0.1 mg/dL — ABNORMAL LOW (ref 0.3–1.2)
Total Protein: 6.3 g/dL — ABNORMAL LOW (ref 6.5–8.1)

## 2015-09-08 LAB — CBC
HCT: 33.2 % — ABNORMAL LOW (ref 36.0–46.0)
Hemoglobin: 10.6 g/dL — ABNORMAL LOW (ref 12.0–15.0)
MCH: 30.1 pg (ref 26.0–34.0)
MCHC: 31.9 g/dL (ref 30.0–36.0)
MCV: 94.3 fL (ref 78.0–100.0)
Platelets: 156 10*3/uL (ref 150–400)
RBC: 3.52 MIL/uL — ABNORMAL LOW (ref 3.87–5.11)
RDW: 14.1 % (ref 11.5–15.5)
WBC: 6.9 10*3/uL (ref 4.0–10.5)

## 2015-09-08 MED ORDER — HYDRALAZINE HCL 20 MG/ML IJ SOLN
5.0000 mg | Freq: Once | INTRAMUSCULAR | Status: AC
  Administered 2015-09-08: 5 mg via INTRAVENOUS
  Filled 2015-09-08: qty 1

## 2015-09-08 MED ORDER — ONDANSETRON HCL 4 MG/2ML IJ SOLN
4.0000 mg | Freq: Four times a day (QID) | INTRAMUSCULAR | Status: DC | PRN
  Administered 2015-09-08: 4 mg via INTRAVENOUS
  Filled 2015-09-08: qty 2

## 2015-09-08 MED ORDER — SILVER SULFADIAZINE 1 % EX CREA
TOPICAL_CREAM | Freq: Two times a day (BID) | CUTANEOUS | Status: DC
Start: 2015-09-08 — End: 2015-09-10
  Administered 2015-09-08 – 2015-09-10 (×3): via TOPICAL
  Filled 2015-09-08: qty 85

## 2015-09-08 NOTE — Progress Notes (Signed)
Orthopedic Tech Progress Note Patient Details:  Becky Hobbs 10-12-1946 [id number]  Ortho Devices Type of Ortho Device: Knee Immobilizer Ortho Device/Splint Interventions: Application   [name] 09/08/2015, 2:21 PM
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
## 2015-09-08 NOTE — Evaluation (Signed)
Occupational Therapy Evaluation Patient Details Name: Becky Hobbs MRN: [id number] DOB: 09-30-1946 Today's Date: 09/08/2015    History of Present Illness Pt is a 69 y./o female with a past medical history significant for HTN, CKD, and hypothyroidism admitted after fall due to syncope and chronic kidney injury.  Pt sustained multiple bumps and bruises, including R knee which was neg for fx on x ray, but MRI revealed Grade 1/2 MCL sprain without tear; pt with KI now. Patient found to have a urinary tract infection with positive urine of cultures, chest x-ray negative, patient clinically dehydrated .      Clinical Impression   Patient presenting with decreased ADL and functional mobility independence. Patient independent PTA. Patient currently functioning at an overall min to mod assist level. Patient will benefit from acute OT to increase overall independence in the areas of ADLs, functional mobility, and overall safety in order to safely discharge home with assistance from family post acute d/c.Marland Kitchen     Follow Up Recommendations  No OT follow up;Supervision - Intermittent    Equipment Recommendations  None recommended by OT    Recommendations for Other Services  None at this time    Precautions / Restrictions Precautions Precautions: Fall Required Braces or Orthoses: Knee Immobilizer - Right Knee Immobilizer - Right: On at all times Restrictions Weight Bearing Restrictions: No    Mobility Bed Mobility Overal bed mobility: Needs Assistance Bed Mobility: Supine to Sit;Sit to Supine     Supine to sit: HOB elevated;Min guard Sit to supine: Mod assist (to assist with BLEs)   General bed mobility comments: mod use of rails  Transfers Overall transfer level: Needs assistance Equipment used: Rolling walker (2 wheeled) Transfers: Sit to/from Stand Sit to Stand: Min guard;Min assist General transfer comment: cues for hand placement and "leg out" before sitting    Balance Overall  balance assessment: No apparent balance deficits (not formally assessed)     ADL Overall ADL's : Needs assistance/impaired Eating/Feeding: Set up;Sitting   Grooming: Set up;Sitting   Upper Body Bathing: Minimal assitance;Sitting   Lower Body Bathing: Moderate assistance;Sit to/from stand   Upper Body Dressing : Minimal assistance;Sitting   Lower Body Dressing: Moderate assistance;Sit to/from stand   Toilet Transfer: Min guard;RW;BSC;Grab bars   Toileting- Water quality scientist and Hygiene: Min guard;Sit to/from stand       Functional mobility during ADLs: Min guard;Minimal assistance;Cueing for safety;Rolling walker      Vision Vision Assessment?: No apparent visual deficits          Pertinent Vitals/Pain Pain Assessment: 0-10 Pain Score: 5  Pain Location: right knee and buttock Pain Descriptors / Indicators: Aching;Grimacing;Discomfort;Sore Pain Intervention(s): Limited activity within patient's tolerance;Monitored during session     Hand Dominance Right   Extremity/Trunk Assessment Upper Extremity Assessment Upper Extremity Assessment: Overall WFL for tasks assessed   Lower Extremity Assessment Lower Extremity Assessment: Defer to PT evaluation   Cervical / Trunk Assessment Cervical / Trunk Assessment: Normal   Communication Communication Communication: No difficulties   Cognition Arousal/Alertness: Awake/alert Behavior During Therapy: WFL for tasks assessed/performed Overall Cognitive Status: Within Functional Limits for tasks assessed              Home Living Family/patient expects to be discharged to:: Private residence Living Arrangements: Alone Available Help at Discharge: Family;Available PRN/intermittently;Neighbor (sister lives in [place], but plans to stay with her for a couple of weeks) Type of Home: House Home Access: Stairs to enter [employer] of Steps: 3 Entrance Stairs-Rails:  Right;Left Home Layout: Two level;1/2  bath on main level Alternate Level Stairs-Number of Steps: flight Alternate Level Stairs-Rails: Left;Right Bathroom Shower/Tub: Walk-in shower;Door   [employer] Toilet: Standard     Home Equipment: Clinical cytogeneticist - 2 wheels;Bedside commode   Prior Functioning/Environment Level of Independence: Independent     OT Diagnosis: Generalized weakness;Acute pain   OT Problem List: Decreased strength;Decreased range of motion;Decreased activity tolerance;Decreased knowledge of use of DME or AE;Decreased knowledge of precautions;Pain   OT Treatment/Interventions: Self-care/ADL training;Therapeutic exercise;Energy conservation;DME and/or AE instruction;Therapeutic activities;Patient/family education;Balance training    OT Goals(Current goals can be found in the care plan section) Acute Rehab OT Goals Patient Stated Goal: go home soon OT Goal Formulation: With patient Time For Goal Achievement: 09/22/15 Potential to Achieve Goals: Good ADL Goals Pt Will Perform Grooming: with modified independence;standing Pt Will Perform Lower Body Bathing: with modified independence;sit to/from stand Pt Will Perform Lower Body Dressing: with modified independence;sit to/from stand Pt Will Transfer to Toilet: with modified independence;ambulating;bedside commode Pt Will Perform Tub/Shower Transfer: Shower transfer;shower seat;rolling walker Additional ADL Goal #1: Pt will be mod I with RW for functional mobility  OT Frequency: Min 2X/week   Barriers to D/C: None known at this time   End of Session Equipment Utilized During Treatment: Right knee immobilizer  Activity Tolerance: Patient tolerated treatment well Patient left: in bed;with call bell/phone within reach;with bed alarm set   Time: 1451-1519 OT Time Calculation (min): 28 min Charges:  OT General Charges $OT Visit: 1 Procedure OT Evaluation $Initial OT Evaluation Tier I: 1 Procedure OT Treatments $Self Care/Home Management : 8-22  mins  Jamond Neels , MS, OTR/L, CLT Pager: [phone]  09/08/2015, 3:40 PM
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
## 2015-09-08 NOTE — Progress Notes (Signed)
Utilization review completed.  

## 2015-09-09 LAB — CBC
HCT: 35.6 % — ABNORMAL LOW (ref 36.0–46.0)
Hemoglobin: 11.7 g/dL — ABNORMAL LOW (ref 12.0–15.0)
MCH: 30.5 pg (ref 26.0–34.0)
MCHC: 32.9 g/dL (ref 30.0–36.0)
MCV: 93 fL (ref 78.0–100.0)
Platelets: 201 K/uL (ref 150–400)
RBC: 3.83 MIL/uL — ABNORMAL LOW (ref 3.87–5.11)
RDW: 13.8 % (ref 11.5–15.5)
WBC: 9.8 K/uL (ref 4.0–10.5)

## 2015-09-09 LAB — GLUCOSE, CAPILLARY
Glucose-Capillary: 106 mg/dL — ABNORMAL HIGH (ref 65–99)
Glucose-Capillary: 100 mg/dL — ABNORMAL HIGH (ref 65–99)
Glucose-Capillary: 87 mg/dL (ref 65–99)
Glucose-Capillary: 112 mg/dL — ABNORMAL HIGH (ref 65–99)

## 2015-09-09 LAB — COMPREHENSIVE METABOLIC PANEL
ALT: 21 U/L (ref 14–54)
AST: 27 U/L (ref 15–41)
Albumin: 3.1 g/dL — ABNORMAL LOW (ref 3.5–5.0)
Alkaline Phosphatase: 79 U/L (ref 38–126)
Anion gap: 9 (ref 5–15)
BUN: 16 mg/dL (ref 6–20)
CO2: 26 mmol/L (ref 22–32)
Calcium: 9.6 mg/dL (ref 8.9–10.3)
Chloride: 104 mmol/L (ref 101–111)
Creatinine, Ser: 1.52 mg/dL — ABNORMAL HIGH (ref 0.44–1.00)
GFR calc Af Amer: 40 mL/min — ABNORMAL LOW (ref >60)
GFR calc non Af Amer: 34 mL/min — ABNORMAL LOW (ref >60)
Glucose, Bld: 94 mg/dL (ref 65–99)
Potassium: 4.3 mmol/L (ref 3.5–5.1)
Sodium: 139 mmol/L (ref 135–145)
Total Bilirubin: 0.5 mg/dL (ref 0.3–1.2)
Total Protein: 6.3 g/dL — ABNORMAL LOW (ref 6.5–8.1)

## 2015-09-09 LAB — URINE CULTURE: Culture: >=100000

## 2015-09-09 LAB — HEMOGLOBIN A1C
Hgb A1c MFr Bld: 6.2 % — ABNORMAL HIGH (ref 4.8–5.6)
Mean Plasma Glucose: 131 mg/dL

## 2015-09-09 MED ORDER — LOSARTAN POTASSIUM 50 MG PO TABS
50.0000 mg | ORAL_TABLET | Freq: Every day | ORAL | Status: DC
  Administered 2015-09-09 – 2015-09-10 (×2): 50 mg via ORAL
  Filled 2015-09-09 (×2): qty 1

## 2015-09-09 MED ORDER — SUMATRIPTAN 20 MG/ACT NA SOLN
20.0000 mg | NASAL | Status: DC | PRN
  Filled 2015-09-09: qty 1

## 2015-09-09 MED ORDER — SENNOSIDES-DOCUSATE SODIUM 8.6-50 MG PO TABS
1.0000 | ORAL_TABLET | Freq: Two times a day (BID) | ORAL | Status: DC
  Administered 2015-09-09 – 2015-09-10 (×3): 1 via ORAL
  Filled 2015-09-09 (×3): qty 1

## 2015-09-09 NOTE — Progress Notes (Signed)
Pharmacist Provided - Patient Medication Education    Becky Hobbs is an 69 y.o. female who presented to [facility] on 09/06/2015 with a chief complaint of  Chief Complaint  Patient presents with  . Hypotension  . Fall      Allergy Assessment Completed and Updated: [x]  Yes    []  No Identified Patient Allergies:  Allergies  Allergen Reactions  . Morphine And Related Itching and Other (See Comments)    Hives (moderate severity)  . Other Anaphylaxis    bees  . Erythromycin Hives and Itching  . Hydrocodone Itching  . Penicillins Hives  . Tramadol Hcl Itching  . Hydrocodone-Acetaminophen Itching    Pt takes medication     Medication Adherence Assessment: []  Excellent (no doses missed/week)      [x]  Good (1 dose missed/week)      []  Partial (2-3 doses missed/week)      []  Poor (>3 doses missed/week)  Barriers to Obtaining Medications: []  Yes [x]  No   Becky Hobbs has good understanding of her medications. She notes recent dizziness which caused her to black out and brought her to the hospital. Prior to last week she does not report dizziness. She reports that her losartan was just added last Monday prior to the dizziness.   She measures her BP at home and states it is usually around 140/90 prior to last week.   Time spent preparing for counseling: 10 minutes Time spent counseling patient: 15 minutes  Joya San, PharmD Clinical Pharmacy Resident Pager # [phone] 09/09/2015 1:44 PM

## 2015-09-09 NOTE — Progress Notes (Signed)
OT Cancellation Note  Patient Details Name: Becky Hobbs MRN: [id number] DOB: [birth year]   Cancelled Treatment:    Reason Eval/Treat Not Completed: Pain limiting ability to participate;Other (comment).  Pt. Declines attempts at skilled OT stating HA, nausea, and not wanting to "move" before hydro apt. That she is anticipating soon.  Reviewed with pt. We will check back as able.  Pt. Verbalized understanding and had no further needs.   Janice Coffin, COTA/L 09/09/2015, 10:53 AM
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
## 2015-09-10 LAB — BASIC METABOLIC PANEL
Anion gap: 9 (ref 5–15)
BUN: 12 mg/dL (ref 6–20)
CO2: 27 mmol/L (ref 22–32)
Calcium: 9.8 mg/dL (ref 8.9–10.3)
Chloride: 103 mmol/L (ref 101–111)
Creatinine, Ser: 1.54 mg/dL — ABNORMAL HIGH (ref 0.44–1.00)
GFR calc Af Amer: 39 mL/min — ABNORMAL LOW (ref >60)
GFR calc non Af Amer: 34 mL/min — ABNORMAL LOW (ref >60)
Glucose, Bld: 100 mg/dL — ABNORMAL HIGH (ref 65–99)
Potassium: 4.3 mmol/L (ref 3.5–5.1)
Sodium: 139 mmol/L (ref 135–145)

## 2015-09-10 LAB — GLUCOSE, CAPILLARY
Glucose-Capillary: 91 mg/dL (ref 65–99)
Glucose-Capillary: 113 mg/dL — ABNORMAL HIGH (ref 65–99)

## 2015-09-10 MED ORDER — CIPROFLOXACIN HCL 250 MG PO TABS: 250.0000 mg | ORAL_TABLET | Freq: Every day | ORAL | Status: DC

## 2015-09-10 MED ORDER — TORSEMIDE 20 MG PO TABS: 20.0000 mg | ORAL_TABLET | Freq: Every day | ORAL | Status: DC | PRN

## 2015-09-10 MED ORDER — GABAPENTIN 300 MG PO CAPS: 600.0000 mg | ORAL_CAPSULE | Freq: Every day | ORAL | Status: DC

## 2015-09-10 MED ORDER — SILVER SULFADIAZINE 1 % EX CREA: TOPICAL_CREAM | Freq: Two times a day (BID) | CUTANEOUS | Status: DC

## 2015-09-10 MED ORDER — SENNOSIDES-DOCUSATE SODIUM 8.6-50 MG PO TABS: 1.0000 | ORAL_TABLET | Freq: Two times a day (BID) | ORAL | Status: DC

## 2015-09-10 NOTE — Care Management Note (Signed)
Case Management Note  Patient Details  Name: Becky Hobbs MRN: [id number] Date of Birth: 08-Feb-1946  Subjective/Objective:         Right knee osteoarthritis, acute-on-chronic kidney injury           Action/Plan: NCM spoke to pt and offered choice for [facility]. Pt agreeable to [facility] for [facility]. Contacted [facility] rep and they can accept referral. Pt states she has RW, Rollator and bedside commode at home. Granddtr and sister are at home to assist with her care as needed.    Expected Discharge Date:  09/10/2015               Expected Discharge Plan:  [place]  In-House Referral:     Discharge planning Services  CM Consult  Post Acute Care Choice:  Home Health Choice offered to:  Patient   HH Arranged:  PT, RN, Nurse's Aide [place] Agency:  [facility]  Status of Service:  Completed, signed off  Medicare Important Message Given:  Yes Date Medicare IM Given:    Medicare IM give by:    Date Additional Medicare IM Given:    Additional Medicare Important Message give by:     If discussed at [place] of Stay Meetings, dates discussed:    Additional Comments:  Erenest Rasher, RN 09/10/2015, 2:55 PM
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
## 2015-09-10 NOTE — Progress Notes (Signed)
Occupational Therapy Treatment Patient Details Name: Becky Hobbs MRN: [id number] DOB: 1945-11-02 Today's Date: 09/10/2015    History of present illness Pt is a 69 y./o female with a past medical history significant for HTN, CKD, and hypothyroidism admitted after fall due to syncope and chronic kidney injury.  Pt sustained multiple bumps and bruises, including R knee which was neg for fx on x ray, but MRI revealed Grade 1/2 MCL sprain without tear; pt with KI now. Patient found to have a urinary tract infection with positive urine of cultures, chest x-ray negative, patient clinically dehydrated .      OT comments  Pt. Making gains with skilled OT.  Able to complete toileting tasks at S level.  Report she will have 24/7 help at home from multiple family members and next door neighbor.  Follow Up Recommendations  No OT follow up;Supervision - Intermittent    Equipment Recommendations  None recommended by OT    Recommendations for Other Services      Precautions / Restrictions Precautions Precautions: Fall Required Braces or Orthoses: Knee Immobilizer - Right Knee Immobilizer - Right: On at all times       Mobility Bed Mobility Overal bed mobility: Modified Independent Bed Mobility: Rolling;Sidelying to Sit Rolling: Modified independent (Device/Increase time) Sidelying to sit: Modified independent (Device/Increase time)       General bed mobility comments: hob flat, no rails, exited on L side, reports she will be sleeping in a full size bed at home, not hers because it is too high  Transfers Overall transfer level: Needs assistance Equipment used: Rolling walker (2 wheeled) Transfers: Sit to/from [facility] Sit to Stand: Supervision Stand pivot transfers: Supervision       General transfer comment: cues for hand placement and "leg out" before sitting    Balance                                   ADL Overall ADL's : Needs  assistance/impaired                     Lower Body Dressing: Moderate assistance;Sit to/from stand Lower Body Dressing Details (indicate cue type and reason): reviewed plaement and proper donning of KI, pt. cont. to state that her g. dtr. will be going that for her Toilet Transfer: Supervision/safety;Ambulation;Regular Toilet;RW   Toileting- Clothing Manipulation and Hygiene: Supervision/safety;Sit to/from stand       Functional mobility during ADLs: Supervision/safety;Min guard;Cueing for safety;Rolling walker General ADL Comments: pt. making gains, has hx. of multiple sx. so she is aware of transfer techniques and use of DME but still requires cues for safety.  declines inst. of KI stating her g.dtr. will be helping her, also declined ub/lb dressing even with her clothes laid out stating "please...my g.dtr will help me with that too".  encouraged her to attempt the tasks she could complete but she declined and listed multiple family members and friends that "always" help her.      Vision                     Perception     Praxis      Cognition   Behavior During Therapy: [facility] for tasks assessed/performed Overall Cognitive Status: Within Functional Limits for tasks assessed  Extremity/Trunk Assessment               Exercises     Shoulder Instructions       General Comments      Pertinent Vitals/ Pain       Pain Assessment: No/denies pain  Home Living                                          Prior Functioning/Environment              Frequency Min 2X/week     Progress Toward Goals  OT Goals(current goals can now be found in the care plan section)  Progress towards OT goals: Progressing toward goals     Plan Discharge plan remains appropriate    Co-evaluation                 End of Session Equipment Utilized During Treatment: Gait belt;Right knee immobilizer;Rolling walker    Activity Tolerance Patient tolerated treatment well   Patient Left in chair;with call bell/phone within reach   Nurse Communication          Time: [id number] OT Time Calculation (min): 25 min  Charges: OT General Charges $OT Visit: 1 Procedure OT Treatments $Self Care/Home Management : 23-37 mins   Janice Coffin, COTA/L 09/10/2015, 9:39 AM

## 2015-09-10 NOTE — Care Management Important Message (Signed)
Important Message  Patient Details  Name: Becky Hobbs MRN: [id number] Date of Birth: October 01, 1946   Medicare Important Message Given:  Yes    Nathen May 09/10/2015, 12:11 PM

## 2015-09-10 NOTE — Progress Notes (Signed)
Patient stated that she did not want to wait to follow up in the hospital with Dr. Lorin Mercy. Patient stated "my granddaughter is coming to pick me up and I have to leave so she can go back to school." Dr. Allyson Sabal notified. Patient will follow up with Dr. Lorin Mercy as outpatient.

## 2015-09-10 NOTE — Progress Notes (Signed)
Physical Therapy Treatment Patient Details Name: VARA OSTERKAMP MRN: [id number] DOB: 07-12-1946 Today's Date: 09/10/2015    History of Present Illness Pt is a 69 y./o female with HTN admitted after fall due to syncope and chronic kidney injury.  Pt sustained multiple bumps and bruises, including R knee which was neg for fx on x ray.  Pt found to have MCL grade 1/2 sprain on rt knee and was placed in knee immobilizer.    PT Comments    Pt mobilizing adequately to return home. Pt to follow up with MD concerning her rt knee injury.  Follow Up Recommendations  No PT follow up;Supervision - Intermittent     Equipment Recommendations  None recommended by PT    Recommendations for Other Services       Precautions / Restrictions Precautions Precautions: Fall Required Braces or Orthoses: Knee Immobilizer - Right Knee Immobilizer - Right: On at all times Restrictions Weight Bearing Restrictions: No    Mobility  Bed Mobility Overal bed mobility: Modified Independent       Supine to sit:  (rail)        Transfers Overall transfer level: Modified independent Equipment used: Rolling walker (2 wheeled) Transfers: Sit to/from Stand Sit to Stand: Modified independent (Device/Increase time)         General transfer comment: No cues needed.  Ambulation/Gait Ambulation/Gait assistance: Modified independent (Device/Increase time) Ambulation Distance (Feet): 125 Feet Assistive device: Rolling walker (2 wheeled) Gait Pattern/deviations: Step-through pattern;Decreased step length - right;Decreased step length - left Gait velocity: slow Gait velocity interpretation: Below normal speed for age/gender General Gait Details: steady gait with walker   Stairs Stairs: Yes Stairs assistance: Supervision Stair Management: Two rails;Step to pattern;Forwards Number of Stairs: 1 General stair comments: Initial instruction for sequence.  Wheelchair Mobility    Modified Rankin  (Stroke Patients Only)       Balance Overall balance assessment: No apparent balance deficits (not formally assessed)                                  Cognition Arousal/Alertness: Awake/alert Behavior During Therapy: WFL for tasks assessed/performed Overall Cognitive Status: Within Functional Limits for tasks assessed                      Exercises      General Comments        Pertinent Vitals/Pain Pain Assessment: No/denies pain    Home Living                      Prior Function            PT Goals (current goals can now be found in the care plan section) Acute Rehab PT Goals Patient Stated Goal: home independent PT Goal Formulation: With patient Time For Goal Achievement: 09/14/15 Potential to Achieve Goals: Good Progress towards PT goals: Progressing toward goals    Frequency  Min 3X/week    PT Plan Current plan remains appropriate    Co-evaluation             End of Session Equipment Utilized During Treatment: Right knee immobilizer Activity Tolerance: Patient tolerated treatment well Patient left: in bed;with call bell/phone within reach (sitting EOB)     Time: [id number] PT Time Calculation (min) (ACUTE ONLY): 15 min  Charges:  $Gait Training: 8-22 mins  G Codes:      Tamilyn Lupien 09/10/2015, 1:52 PM [place]

## 2015-09-12 DIAGNOSIS — T2123XD Burn of second degree of upper back, subsequent encounter: Secondary | ICD-10-CM | POA: Diagnosis not present

## 2015-09-12 DIAGNOSIS — Z9181 History of falling: Secondary | ICD-10-CM | POA: Diagnosis not present

## 2015-09-12 DIAGNOSIS — D631 Anemia in chronic kidney disease: Secondary | ICD-10-CM | POA: Diagnosis not present

## 2015-09-12 DIAGNOSIS — N189 Chronic kidney disease, unspecified: Secondary | ICD-10-CM | POA: Diagnosis not present

## 2015-09-12 DIAGNOSIS — I129 Hypertensive chronic kidney disease with stage 1 through stage 4 chronic kidney disease, or unspecified chronic kidney disease: Secondary | ICD-10-CM | POA: Diagnosis not present

## 2015-09-12 DIAGNOSIS — N390 Urinary tract infection, site not specified: Secondary | ICD-10-CM | POA: Diagnosis not present

## 2015-09-12 DIAGNOSIS — Z7982 Long term (current) use of aspirin: Secondary | ICD-10-CM | POA: Diagnosis not present

## 2015-09-12 DIAGNOSIS — M1711 Unilateral primary osteoarthritis, right knee: Secondary | ICD-10-CM | POA: Diagnosis not present

## 2015-09-12 DIAGNOSIS — M6281 Muscle weakness (generalized): Secondary | ICD-10-CM | POA: Diagnosis not present

## 2015-09-12 DIAGNOSIS — M109 Gout, unspecified: Secondary | ICD-10-CM | POA: Diagnosis not present

## 2015-09-12 DIAGNOSIS — E039 Hypothyroidism, unspecified: Secondary | ICD-10-CM | POA: Diagnosis not present

## 2015-09-12 DIAGNOSIS — Z792 Long term (current) use of antibiotics: Secondary | ICD-10-CM | POA: Diagnosis not present

## 2015-09-12 LAB — CULTURE, BLOOD (ROUTINE X 2)
Culture: NO GROWTH
Culture: NO GROWTH

## 2015-09-13 DIAGNOSIS — M1711 Unilateral primary osteoarthritis, right knee: Secondary | ICD-10-CM | POA: Diagnosis not present

## 2015-09-13 DIAGNOSIS — T2123XD Burn of second degree of upper back, subsequent encounter: Secondary | ICD-10-CM | POA: Diagnosis not present

## 2015-09-13 DIAGNOSIS — D631 Anemia in chronic kidney disease: Secondary | ICD-10-CM | POA: Diagnosis not present

## 2015-09-13 DIAGNOSIS — N189 Chronic kidney disease, unspecified: Secondary | ICD-10-CM | POA: Diagnosis not present

## 2015-09-13 DIAGNOSIS — N390 Urinary tract infection, site not specified: Secondary | ICD-10-CM | POA: Diagnosis not present

## 2015-09-13 DIAGNOSIS — I129 Hypertensive chronic kidney disease with stage 1 through stage 4 chronic kidney disease, or unspecified chronic kidney disease: Secondary | ICD-10-CM | POA: Diagnosis not present

## 2015-09-16 DIAGNOSIS — I129 Hypertensive chronic kidney disease with stage 1 through stage 4 chronic kidney disease, or unspecified chronic kidney disease: Secondary | ICD-10-CM | POA: Diagnosis not present

## 2015-09-16 DIAGNOSIS — N189 Chronic kidney disease, unspecified: Secondary | ICD-10-CM | POA: Diagnosis not present

## 2015-09-16 DIAGNOSIS — T2123XD Burn of second degree of upper back, subsequent encounter: Secondary | ICD-10-CM | POA: Diagnosis not present

## 2015-09-16 DIAGNOSIS — D631 Anemia in chronic kidney disease: Secondary | ICD-10-CM | POA: Diagnosis not present

## 2015-09-16 DIAGNOSIS — N390 Urinary tract infection, site not specified: Secondary | ICD-10-CM | POA: Diagnosis not present

## 2015-09-16 DIAGNOSIS — M1711 Unilateral primary osteoarthritis, right knee: Secondary | ICD-10-CM | POA: Diagnosis not present

## 2015-09-17 DIAGNOSIS — D6489 Other specified anemias: Secondary | ICD-10-CM | POA: Diagnosis not present

## 2015-09-17 DIAGNOSIS — N183 Chronic kidney disease, stage 3 (moderate): Secondary | ICD-10-CM | POA: Diagnosis not present

## 2015-09-17 DIAGNOSIS — N179 Acute kidney failure, unspecified: Secondary | ICD-10-CM | POA: Diagnosis not present

## 2015-09-18 DIAGNOSIS — T2123XD Burn of second degree of upper back, subsequent encounter: Secondary | ICD-10-CM | POA: Diagnosis not present

## 2015-09-18 DIAGNOSIS — I129 Hypertensive chronic kidney disease with stage 1 through stage 4 chronic kidney disease, or unspecified chronic kidney disease: Secondary | ICD-10-CM | POA: Diagnosis not present

## 2015-09-18 DIAGNOSIS — N189 Chronic kidney disease, unspecified: Secondary | ICD-10-CM | POA: Diagnosis not present

## 2015-09-18 DIAGNOSIS — M1711 Unilateral primary osteoarthritis, right knee: Secondary | ICD-10-CM | POA: Diagnosis not present

## 2015-09-18 DIAGNOSIS — N390 Urinary tract infection, site not specified: Secondary | ICD-10-CM | POA: Diagnosis not present

## 2015-09-18 DIAGNOSIS — D631 Anemia in chronic kidney disease: Secondary | ICD-10-CM | POA: Diagnosis not present

## 2015-09-20 DIAGNOSIS — M25561 Pain in right knee: Secondary | ICD-10-CM | POA: Diagnosis not present

## 2015-09-25 DIAGNOSIS — N189 Chronic kidney disease, unspecified: Secondary | ICD-10-CM | POA: Diagnosis not present

## 2015-09-25 DIAGNOSIS — D631 Anemia in chronic kidney disease: Secondary | ICD-10-CM | POA: Diagnosis not present

## 2015-09-25 DIAGNOSIS — I129 Hypertensive chronic kidney disease with stage 1 through stage 4 chronic kidney disease, or unspecified chronic kidney disease: Secondary | ICD-10-CM | POA: Diagnosis not present

## 2015-09-25 DIAGNOSIS — N390 Urinary tract infection, site not specified: Secondary | ICD-10-CM | POA: Diagnosis not present

## 2015-09-25 DIAGNOSIS — M1711 Unilateral primary osteoarthritis, right knee: Secondary | ICD-10-CM | POA: Diagnosis not present

## 2015-09-25 DIAGNOSIS — T2123XD Burn of second degree of upper back, subsequent encounter: Secondary | ICD-10-CM | POA: Diagnosis not present

## 2015-09-27 DIAGNOSIS — T2123XD Burn of second degree of upper back, subsequent encounter: Secondary | ICD-10-CM | POA: Diagnosis not present

## 2015-09-27 DIAGNOSIS — N189 Chronic kidney disease, unspecified: Secondary | ICD-10-CM | POA: Diagnosis not present

## 2015-09-27 DIAGNOSIS — N390 Urinary tract infection, site not specified: Secondary | ICD-10-CM | POA: Diagnosis not present

## 2015-09-27 DIAGNOSIS — M1711 Unilateral primary osteoarthritis, right knee: Secondary | ICD-10-CM | POA: Diagnosis not present

## 2015-09-27 DIAGNOSIS — I129 Hypertensive chronic kidney disease with stage 1 through stage 4 chronic kidney disease, or unspecified chronic kidney disease: Secondary | ICD-10-CM | POA: Diagnosis not present

## 2015-09-27 DIAGNOSIS — D631 Anemia in chronic kidney disease: Secondary | ICD-10-CM | POA: Diagnosis not present

## 2015-10-02 DIAGNOSIS — M25561 Pain in right knee: Secondary | ICD-10-CM | POA: Diagnosis not present

## 2015-10-05 DIAGNOSIS — D631 Anemia in chronic kidney disease: Secondary | ICD-10-CM | POA: Diagnosis not present

## 2015-10-05 DIAGNOSIS — N390 Urinary tract infection, site not specified: Secondary | ICD-10-CM | POA: Diagnosis not present

## 2015-10-05 DIAGNOSIS — T2123XD Burn of second degree of upper back, subsequent encounter: Secondary | ICD-10-CM | POA: Diagnosis not present

## 2015-10-05 DIAGNOSIS — I129 Hypertensive chronic kidney disease with stage 1 through stage 4 chronic kidney disease, or unspecified chronic kidney disease: Secondary | ICD-10-CM | POA: Diagnosis not present

## 2015-10-05 DIAGNOSIS — N189 Chronic kidney disease, unspecified: Secondary | ICD-10-CM | POA: Diagnosis not present

## 2015-10-05 DIAGNOSIS — M1711 Unilateral primary osteoarthritis, right knee: Secondary | ICD-10-CM | POA: Diagnosis not present

## 2015-10-09 DIAGNOSIS — D6489 Other specified anemias: Secondary | ICD-10-CM | POA: Diagnosis not present

## 2015-10-09 DIAGNOSIS — E871 Hypo-osmolality and hyponatremia: Secondary | ICD-10-CM | POA: Diagnosis not present

## 2015-10-09 DIAGNOSIS — Z6837 Body mass index (BMI) 37.0-37.9, adult: Secondary | ICD-10-CM | POA: Diagnosis not present

## 2015-10-09 DIAGNOSIS — M461 Sacroiliitis, not elsewhere classified: Secondary | ICD-10-CM | POA: Diagnosis not present

## 2015-10-09 DIAGNOSIS — T300 Burn of unspecified body region, unspecified degree: Secondary | ICD-10-CM | POA: Diagnosis not present

## 2015-10-09 DIAGNOSIS — N179 Acute kidney failure, unspecified: Secondary | ICD-10-CM | POA: Diagnosis not present

## 2015-10-09 DIAGNOSIS — I10 Essential (primary) hypertension: Secondary | ICD-10-CM | POA: Diagnosis not present

## 2015-10-09 DIAGNOSIS — R55 Syncope and collapse: Secondary | ICD-10-CM | POA: Diagnosis not present

## 2015-10-09 DIAGNOSIS — M545 Low back pain: Secondary | ICD-10-CM | POA: Diagnosis not present

## 2015-10-10 DIAGNOSIS — I129 Hypertensive chronic kidney disease with stage 1 through stage 4 chronic kidney disease, or unspecified chronic kidney disease: Secondary | ICD-10-CM | POA: Diagnosis not present

## 2015-10-10 DIAGNOSIS — T2123XD Burn of second degree of upper back, subsequent encounter: Secondary | ICD-10-CM | POA: Diagnosis not present

## 2015-10-10 DIAGNOSIS — M1711 Unilateral primary osteoarthritis, right knee: Secondary | ICD-10-CM | POA: Diagnosis not present

## 2015-10-10 DIAGNOSIS — N189 Chronic kidney disease, unspecified: Secondary | ICD-10-CM | POA: Diagnosis not present

## 2015-10-10 DIAGNOSIS — N390 Urinary tract infection, site not specified: Secondary | ICD-10-CM | POA: Diagnosis not present

## 2015-10-10 DIAGNOSIS — D631 Anemia in chronic kidney disease: Secondary | ICD-10-CM | POA: Diagnosis not present

## 2015-10-13 DIAGNOSIS — M1711 Unilateral primary osteoarthritis, right knee: Secondary | ICD-10-CM | POA: Diagnosis not present

## 2015-10-13 DIAGNOSIS — N189 Chronic kidney disease, unspecified: Secondary | ICD-10-CM | POA: Diagnosis not present

## 2015-10-13 DIAGNOSIS — I129 Hypertensive chronic kidney disease with stage 1 through stage 4 chronic kidney disease, or unspecified chronic kidney disease: Secondary | ICD-10-CM | POA: Diagnosis not present

## 2015-10-13 DIAGNOSIS — N390 Urinary tract infection, site not specified: Secondary | ICD-10-CM | POA: Diagnosis not present

## 2015-10-13 DIAGNOSIS — T2123XD Burn of second degree of upper back, subsequent encounter: Secondary | ICD-10-CM | POA: Diagnosis not present

## 2015-10-13 DIAGNOSIS — D631 Anemia in chronic kidney disease: Secondary | ICD-10-CM | POA: Diagnosis not present

## 2015-10-15 DIAGNOSIS — M25561 Pain in right knee: Secondary | ICD-10-CM | POA: Diagnosis not present

## 2015-10-16 ENCOUNTER — Other Ambulatory Visit: Payer: Self-pay | Admitting: Nurse Practitioner

## 2015-10-16 DIAGNOSIS — K7460 Unspecified cirrhosis of liver: Secondary | ICD-10-CM | POA: Diagnosis not present

## 2015-10-16 DIAGNOSIS — K7469 Other cirrhosis of liver: Secondary | ICD-10-CM

## 2015-10-16 DIAGNOSIS — B182 Chronic viral hepatitis C: Secondary | ICD-10-CM | POA: Diagnosis not present

## 2015-10-17 DIAGNOSIS — N189 Chronic kidney disease, unspecified: Secondary | ICD-10-CM | POA: Diagnosis not present

## 2015-10-17 DIAGNOSIS — N390 Urinary tract infection, site not specified: Secondary | ICD-10-CM | POA: Diagnosis not present

## 2015-10-17 DIAGNOSIS — T2123XD Burn of second degree of upper back, subsequent encounter: Secondary | ICD-10-CM | POA: Diagnosis not present

## 2015-10-17 DIAGNOSIS — D631 Anemia in chronic kidney disease: Secondary | ICD-10-CM | POA: Diagnosis not present

## 2015-10-17 DIAGNOSIS — M1711 Unilateral primary osteoarthritis, right knee: Secondary | ICD-10-CM | POA: Diagnosis not present

## 2015-10-17 DIAGNOSIS — I129 Hypertensive chronic kidney disease with stage 1 through stage 4 chronic kidney disease, or unspecified chronic kidney disease: Secondary | ICD-10-CM | POA: Diagnosis not present

## 2015-10-21 DIAGNOSIS — M461 Sacroiliitis, not elsewhere classified: Secondary | ICD-10-CM | POA: Diagnosis not present

## 2015-10-25 DIAGNOSIS — D631 Anemia in chronic kidney disease: Secondary | ICD-10-CM | POA: Diagnosis not present

## 2015-10-25 DIAGNOSIS — N390 Urinary tract infection, site not specified: Secondary | ICD-10-CM | POA: Diagnosis not present

## 2015-10-25 DIAGNOSIS — M1711 Unilateral primary osteoarthritis, right knee: Secondary | ICD-10-CM | POA: Diagnosis not present

## 2015-10-25 DIAGNOSIS — N189 Chronic kidney disease, unspecified: Secondary | ICD-10-CM | POA: Diagnosis not present

## 2015-10-25 DIAGNOSIS — I129 Hypertensive chronic kidney disease with stage 1 through stage 4 chronic kidney disease, or unspecified chronic kidney disease: Secondary | ICD-10-CM | POA: Diagnosis not present

## 2015-10-25 DIAGNOSIS — T2123XD Burn of second degree of upper back, subsequent encounter: Secondary | ICD-10-CM | POA: Diagnosis not present

## 2015-10-29 DIAGNOSIS — D631 Anemia in chronic kidney disease: Secondary | ICD-10-CM | POA: Diagnosis not present

## 2015-10-29 DIAGNOSIS — M1711 Unilateral primary osteoarthritis, right knee: Secondary | ICD-10-CM | POA: Diagnosis not present

## 2015-10-29 DIAGNOSIS — I129 Hypertensive chronic kidney disease with stage 1 through stage 4 chronic kidney disease, or unspecified chronic kidney disease: Secondary | ICD-10-CM | POA: Diagnosis not present

## 2015-10-29 DIAGNOSIS — N189 Chronic kidney disease, unspecified: Secondary | ICD-10-CM | POA: Diagnosis not present

## 2015-10-29 DIAGNOSIS — N390 Urinary tract infection, site not specified: Secondary | ICD-10-CM | POA: Diagnosis not present

## 2015-10-29 DIAGNOSIS — T2123XD Burn of second degree of upper back, subsequent encounter: Secondary | ICD-10-CM | POA: Diagnosis not present

## 2015-10-31 DIAGNOSIS — T2123XD Burn of second degree of upper back, subsequent encounter: Secondary | ICD-10-CM | POA: Diagnosis not present

## 2015-10-31 DIAGNOSIS — N390 Urinary tract infection, site not specified: Secondary | ICD-10-CM | POA: Diagnosis not present

## 2015-10-31 DIAGNOSIS — N189 Chronic kidney disease, unspecified: Secondary | ICD-10-CM | POA: Diagnosis not present

## 2015-10-31 DIAGNOSIS — M1711 Unilateral primary osteoarthritis, right knee: Secondary | ICD-10-CM | POA: Diagnosis not present

## 2015-10-31 DIAGNOSIS — D631 Anemia in chronic kidney disease: Secondary | ICD-10-CM | POA: Diagnosis not present

## 2015-10-31 DIAGNOSIS — I129 Hypertensive chronic kidney disease with stage 1 through stage 4 chronic kidney disease, or unspecified chronic kidney disease: Secondary | ICD-10-CM | POA: Diagnosis not present

## 2015-11-06 DIAGNOSIS — T2123XD Burn of second degree of upper back, subsequent encounter: Secondary | ICD-10-CM | POA: Diagnosis not present

## 2015-11-06 DIAGNOSIS — M1711 Unilateral primary osteoarthritis, right knee: Secondary | ICD-10-CM | POA: Diagnosis not present

## 2015-11-06 DIAGNOSIS — I129 Hypertensive chronic kidney disease with stage 1 through stage 4 chronic kidney disease, or unspecified chronic kidney disease: Secondary | ICD-10-CM | POA: Diagnosis not present

## 2015-11-06 DIAGNOSIS — N189 Chronic kidney disease, unspecified: Secondary | ICD-10-CM | POA: Diagnosis not present

## 2015-11-06 DIAGNOSIS — D631 Anemia in chronic kidney disease: Secondary | ICD-10-CM | POA: Diagnosis not present

## 2015-11-06 DIAGNOSIS — N390 Urinary tract infection, site not specified: Secondary | ICD-10-CM | POA: Diagnosis not present

## 2015-11-11 DIAGNOSIS — T2123XD Burn of second degree of upper back, subsequent encounter: Secondary | ICD-10-CM | POA: Diagnosis not present

## 2015-11-11 DIAGNOSIS — N390 Urinary tract infection, site not specified: Secondary | ICD-10-CM | POA: Diagnosis not present

## 2015-11-11 DIAGNOSIS — Z9181 History of falling: Secondary | ICD-10-CM | POA: Diagnosis not present

## 2015-11-11 DIAGNOSIS — E039 Hypothyroidism, unspecified: Secondary | ICD-10-CM | POA: Diagnosis not present

## 2015-11-11 DIAGNOSIS — Z792 Long term (current) use of antibiotics: Secondary | ICD-10-CM | POA: Diagnosis not present

## 2015-11-11 DIAGNOSIS — M109 Gout, unspecified: Secondary | ICD-10-CM | POA: Diagnosis not present

## 2015-11-11 DIAGNOSIS — N189 Chronic kidney disease, unspecified: Secondary | ICD-10-CM | POA: Diagnosis not present

## 2015-11-11 DIAGNOSIS — Z7982 Long term (current) use of aspirin: Secondary | ICD-10-CM | POA: Diagnosis not present

## 2015-11-11 DIAGNOSIS — I129 Hypertensive chronic kidney disease with stage 1 through stage 4 chronic kidney disease, or unspecified chronic kidney disease: Secondary | ICD-10-CM | POA: Diagnosis not present

## 2015-11-11 DIAGNOSIS — M1711 Unilateral primary osteoarthritis, right knee: Secondary | ICD-10-CM | POA: Diagnosis not present

## 2015-11-11 DIAGNOSIS — D631 Anemia in chronic kidney disease: Secondary | ICD-10-CM | POA: Diagnosis not present

## 2015-11-12 DIAGNOSIS — I129 Hypertensive chronic kidney disease with stage 1 through stage 4 chronic kidney disease, or unspecified chronic kidney disease: Secondary | ICD-10-CM | POA: Diagnosis not present

## 2015-11-12 DIAGNOSIS — M1711 Unilateral primary osteoarthritis, right knee: Secondary | ICD-10-CM | POA: Diagnosis not present

## 2015-11-12 DIAGNOSIS — N390 Urinary tract infection, site not specified: Secondary | ICD-10-CM | POA: Diagnosis not present

## 2015-11-12 DIAGNOSIS — T2123XD Burn of second degree of upper back, subsequent encounter: Secondary | ICD-10-CM | POA: Diagnosis not present

## 2015-11-12 DIAGNOSIS — N189 Chronic kidney disease, unspecified: Secondary | ICD-10-CM | POA: Diagnosis not present

## 2015-11-12 DIAGNOSIS — D631 Anemia in chronic kidney disease: Secondary | ICD-10-CM | POA: Diagnosis not present

## 2015-11-14 DIAGNOSIS — N189 Chronic kidney disease, unspecified: Secondary | ICD-10-CM | POA: Diagnosis not present

## 2015-11-14 DIAGNOSIS — T2123XD Burn of second degree of upper back, subsequent encounter: Secondary | ICD-10-CM | POA: Diagnosis not present

## 2015-11-14 DIAGNOSIS — N390 Urinary tract infection, site not specified: Secondary | ICD-10-CM | POA: Diagnosis not present

## 2015-11-14 DIAGNOSIS — D631 Anemia in chronic kidney disease: Secondary | ICD-10-CM | POA: Diagnosis not present

## 2015-11-14 DIAGNOSIS — I129 Hypertensive chronic kidney disease with stage 1 through stage 4 chronic kidney disease, or unspecified chronic kidney disease: Secondary | ICD-10-CM | POA: Diagnosis not present

## 2015-11-14 DIAGNOSIS — M1711 Unilateral primary osteoarthritis, right knee: Secondary | ICD-10-CM | POA: Diagnosis not present

## 2015-11-18 DIAGNOSIS — N390 Urinary tract infection, site not specified: Secondary | ICD-10-CM | POA: Diagnosis not present

## 2015-11-18 DIAGNOSIS — N189 Chronic kidney disease, unspecified: Secondary | ICD-10-CM | POA: Diagnosis not present

## 2015-11-18 DIAGNOSIS — T2123XD Burn of second degree of upper back, subsequent encounter: Secondary | ICD-10-CM | POA: Diagnosis not present

## 2015-11-18 DIAGNOSIS — I129 Hypertensive chronic kidney disease with stage 1 through stage 4 chronic kidney disease, or unspecified chronic kidney disease: Secondary | ICD-10-CM | POA: Diagnosis not present

## 2015-11-18 DIAGNOSIS — D631 Anemia in chronic kidney disease: Secondary | ICD-10-CM | POA: Diagnosis not present

## 2015-11-18 DIAGNOSIS — M1711 Unilateral primary osteoarthritis, right knee: Secondary | ICD-10-CM | POA: Diagnosis not present

## 2015-11-20 DIAGNOSIS — M47817 Spondylosis without myelopathy or radiculopathy, lumbosacral region: Secondary | ICD-10-CM | POA: Diagnosis not present

## 2015-11-20 DIAGNOSIS — M47896 Other spondylosis, lumbar region: Secondary | ICD-10-CM | POA: Diagnosis not present

## 2015-11-20 DIAGNOSIS — M791 Myalgia: Secondary | ICD-10-CM | POA: Diagnosis not present

## 2015-11-21 DIAGNOSIS — I129 Hypertensive chronic kidney disease with stage 1 through stage 4 chronic kidney disease, or unspecified chronic kidney disease: Secondary | ICD-10-CM | POA: Diagnosis not present

## 2015-11-21 DIAGNOSIS — N189 Chronic kidney disease, unspecified: Secondary | ICD-10-CM | POA: Diagnosis not present

## 2015-11-21 DIAGNOSIS — D631 Anemia in chronic kidney disease: Secondary | ICD-10-CM | POA: Diagnosis not present

## 2015-11-21 DIAGNOSIS — T2123XD Burn of second degree of upper back, subsequent encounter: Secondary | ICD-10-CM | POA: Diagnosis not present

## 2015-11-21 DIAGNOSIS — M1711 Unilateral primary osteoarthritis, right knee: Secondary | ICD-10-CM | POA: Diagnosis not present

## 2015-11-21 DIAGNOSIS — N390 Urinary tract infection, site not specified: Secondary | ICD-10-CM | POA: Diagnosis not present

## 2015-11-23 DIAGNOSIS — N189 Chronic kidney disease, unspecified: Secondary | ICD-10-CM | POA: Diagnosis not present

## 2015-11-23 DIAGNOSIS — D631 Anemia in chronic kidney disease: Secondary | ICD-10-CM | POA: Diagnosis not present

## 2015-11-23 DIAGNOSIS — I129 Hypertensive chronic kidney disease with stage 1 through stage 4 chronic kidney disease, or unspecified chronic kidney disease: Secondary | ICD-10-CM | POA: Diagnosis not present

## 2015-11-23 DIAGNOSIS — T2123XD Burn of second degree of upper back, subsequent encounter: Secondary | ICD-10-CM | POA: Diagnosis not present

## 2015-11-23 DIAGNOSIS — N390 Urinary tract infection, site not specified: Secondary | ICD-10-CM | POA: Diagnosis not present

## 2015-11-23 DIAGNOSIS — M1711 Unilateral primary osteoarthritis, right knee: Secondary | ICD-10-CM | POA: Diagnosis not present

## 2015-11-26 DIAGNOSIS — T2123XD Burn of second degree of upper back, subsequent encounter: Secondary | ICD-10-CM | POA: Diagnosis not present

## 2015-11-26 DIAGNOSIS — M1711 Unilateral primary osteoarthritis, right knee: Secondary | ICD-10-CM | POA: Diagnosis not present

## 2015-11-26 DIAGNOSIS — I129 Hypertensive chronic kidney disease with stage 1 through stage 4 chronic kidney disease, or unspecified chronic kidney disease: Secondary | ICD-10-CM | POA: Diagnosis not present

## 2015-11-26 DIAGNOSIS — N189 Chronic kidney disease, unspecified: Secondary | ICD-10-CM | POA: Diagnosis not present

## 2015-11-26 DIAGNOSIS — D631 Anemia in chronic kidney disease: Secondary | ICD-10-CM | POA: Diagnosis not present

## 2015-11-26 DIAGNOSIS — N390 Urinary tract infection, site not specified: Secondary | ICD-10-CM | POA: Diagnosis not present

## 2015-11-28 DIAGNOSIS — N189 Chronic kidney disease, unspecified: Secondary | ICD-10-CM | POA: Diagnosis not present

## 2015-11-28 DIAGNOSIS — M1711 Unilateral primary osteoarthritis, right knee: Secondary | ICD-10-CM | POA: Diagnosis not present

## 2015-11-28 DIAGNOSIS — N390 Urinary tract infection, site not specified: Secondary | ICD-10-CM | POA: Diagnosis not present

## 2015-11-28 DIAGNOSIS — I129 Hypertensive chronic kidney disease with stage 1 through stage 4 chronic kidney disease, or unspecified chronic kidney disease: Secondary | ICD-10-CM | POA: Diagnosis not present

## 2015-11-28 DIAGNOSIS — D631 Anemia in chronic kidney disease: Secondary | ICD-10-CM | POA: Diagnosis not present

## 2015-11-28 DIAGNOSIS — T2123XD Burn of second degree of upper back, subsequent encounter: Secondary | ICD-10-CM | POA: Diagnosis not present

## 2015-12-05 DIAGNOSIS — M1711 Unilateral primary osteoarthritis, right knee: Secondary | ICD-10-CM | POA: Diagnosis not present

## 2015-12-05 DIAGNOSIS — I129 Hypertensive chronic kidney disease with stage 1 through stage 4 chronic kidney disease, or unspecified chronic kidney disease: Secondary | ICD-10-CM | POA: Diagnosis not present

## 2015-12-05 DIAGNOSIS — N390 Urinary tract infection, site not specified: Secondary | ICD-10-CM | POA: Diagnosis not present

## 2015-12-05 DIAGNOSIS — T2123XD Burn of second degree of upper back, subsequent encounter: Secondary | ICD-10-CM | POA: Diagnosis not present

## 2015-12-05 DIAGNOSIS — N189 Chronic kidney disease, unspecified: Secondary | ICD-10-CM | POA: Diagnosis not present

## 2015-12-05 DIAGNOSIS — D631 Anemia in chronic kidney disease: Secondary | ICD-10-CM | POA: Diagnosis not present

## 2015-12-06 DIAGNOSIS — T2123XD Burn of second degree of upper back, subsequent encounter: Secondary | ICD-10-CM | POA: Diagnosis not present

## 2015-12-06 DIAGNOSIS — D631 Anemia in chronic kidney disease: Secondary | ICD-10-CM | POA: Diagnosis not present

## 2015-12-06 DIAGNOSIS — N390 Urinary tract infection, site not specified: Secondary | ICD-10-CM | POA: Diagnosis not present

## 2015-12-06 DIAGNOSIS — N189 Chronic kidney disease, unspecified: Secondary | ICD-10-CM | POA: Diagnosis not present

## 2015-12-06 DIAGNOSIS — M1711 Unilateral primary osteoarthritis, right knee: Secondary | ICD-10-CM | POA: Diagnosis not present

## 2015-12-06 DIAGNOSIS — I129 Hypertensive chronic kidney disease with stage 1 through stage 4 chronic kidney disease, or unspecified chronic kidney disease: Secondary | ICD-10-CM | POA: Diagnosis not present

## 2015-12-10 DIAGNOSIS — I129 Hypertensive chronic kidney disease with stage 1 through stage 4 chronic kidney disease, or unspecified chronic kidney disease: Secondary | ICD-10-CM | POA: Diagnosis not present

## 2015-12-10 DIAGNOSIS — D631 Anemia in chronic kidney disease: Secondary | ICD-10-CM | POA: Diagnosis not present

## 2015-12-10 DIAGNOSIS — N390 Urinary tract infection, site not specified: Secondary | ICD-10-CM | POA: Diagnosis not present

## 2015-12-10 DIAGNOSIS — T2123XD Burn of second degree of upper back, subsequent encounter: Secondary | ICD-10-CM | POA: Diagnosis not present

## 2015-12-10 DIAGNOSIS — M1711 Unilateral primary osteoarthritis, right knee: Secondary | ICD-10-CM | POA: Diagnosis not present

## 2015-12-10 DIAGNOSIS — N189 Chronic kidney disease, unspecified: Secondary | ICD-10-CM | POA: Diagnosis not present

## 2015-12-11 DIAGNOSIS — N179 Acute kidney failure, unspecified: Secondary | ICD-10-CM | POA: Diagnosis not present

## 2015-12-11 DIAGNOSIS — I129 Hypertensive chronic kidney disease with stage 1 through stage 4 chronic kidney disease, or unspecified chronic kidney disease: Secondary | ICD-10-CM | POA: Diagnosis not present

## 2015-12-11 DIAGNOSIS — N390 Urinary tract infection, site not specified: Secondary | ICD-10-CM | POA: Diagnosis not present

## 2015-12-11 DIAGNOSIS — R413 Other amnesia: Secondary | ICD-10-CM | POA: Diagnosis not present

## 2015-12-11 DIAGNOSIS — N189 Chronic kidney disease, unspecified: Secondary | ICD-10-CM | POA: Diagnosis not present

## 2015-12-11 DIAGNOSIS — T2123XD Burn of second degree of upper back, subsequent encounter: Secondary | ICD-10-CM | POA: Diagnosis not present

## 2015-12-11 DIAGNOSIS — R410 Disorientation, unspecified: Secondary | ICD-10-CM | POA: Diagnosis not present

## 2015-12-11 DIAGNOSIS — M1711 Unilateral primary osteoarthritis, right knee: Secondary | ICD-10-CM | POA: Diagnosis not present

## 2015-12-11 DIAGNOSIS — I10 Essential (primary) hypertension: Secondary | ICD-10-CM | POA: Diagnosis not present

## 2015-12-11 DIAGNOSIS — Z6835 Body mass index (BMI) 35.0-35.9, adult: Secondary | ICD-10-CM | POA: Diagnosis not present

## 2015-12-11 DIAGNOSIS — D631 Anemia in chronic kidney disease: Secondary | ICD-10-CM | POA: Diagnosis not present

## 2015-12-13 ENCOUNTER — Other Ambulatory Visit: Payer: Medicare Other

## 2015-12-13 DIAGNOSIS — D631 Anemia in chronic kidney disease: Secondary | ICD-10-CM | POA: Diagnosis not present

## 2015-12-13 DIAGNOSIS — T2123XD Burn of second degree of upper back, subsequent encounter: Secondary | ICD-10-CM | POA: Diagnosis not present

## 2015-12-13 DIAGNOSIS — N189 Chronic kidney disease, unspecified: Secondary | ICD-10-CM | POA: Diagnosis not present

## 2015-12-13 DIAGNOSIS — I129 Hypertensive chronic kidney disease with stage 1 through stage 4 chronic kidney disease, or unspecified chronic kidney disease: Secondary | ICD-10-CM | POA: Diagnosis not present

## 2015-12-13 DIAGNOSIS — N390 Urinary tract infection, site not specified: Secondary | ICD-10-CM | POA: Diagnosis not present

## 2015-12-13 DIAGNOSIS — M1711 Unilateral primary osteoarthritis, right knee: Secondary | ICD-10-CM | POA: Diagnosis not present

## 2015-12-16 DIAGNOSIS — N390 Urinary tract infection, site not specified: Secondary | ICD-10-CM | POA: Diagnosis not present

## 2015-12-16 DIAGNOSIS — Z6836 Body mass index (BMI) 36.0-36.9, adult: Secondary | ICD-10-CM | POA: Diagnosis not present

## 2015-12-16 DIAGNOSIS — N183 Chronic kidney disease, stage 3 (moderate): Secondary | ICD-10-CM | POA: Diagnosis not present

## 2015-12-16 DIAGNOSIS — E119 Type 2 diabetes mellitus without complications: Secondary | ICD-10-CM | POA: Diagnosis not present

## 2015-12-17 DIAGNOSIS — T2123XD Burn of second degree of upper back, subsequent encounter: Secondary | ICD-10-CM | POA: Diagnosis not present

## 2015-12-17 DIAGNOSIS — D631 Anemia in chronic kidney disease: Secondary | ICD-10-CM | POA: Diagnosis not present

## 2015-12-17 DIAGNOSIS — I129 Hypertensive chronic kidney disease with stage 1 through stage 4 chronic kidney disease, or unspecified chronic kidney disease: Secondary | ICD-10-CM | POA: Diagnosis not present

## 2015-12-17 DIAGNOSIS — N390 Urinary tract infection, site not specified: Secondary | ICD-10-CM | POA: Diagnosis not present

## 2015-12-17 DIAGNOSIS — M1711 Unilateral primary osteoarthritis, right knee: Secondary | ICD-10-CM | POA: Diagnosis not present

## 2015-12-17 DIAGNOSIS — N189 Chronic kidney disease, unspecified: Secondary | ICD-10-CM | POA: Diagnosis not present

## 2015-12-19 DIAGNOSIS — N189 Chronic kidney disease, unspecified: Secondary | ICD-10-CM | POA: Diagnosis not present

## 2015-12-19 DIAGNOSIS — I129 Hypertensive chronic kidney disease with stage 1 through stage 4 chronic kidney disease, or unspecified chronic kidney disease: Secondary | ICD-10-CM | POA: Diagnosis not present

## 2015-12-19 DIAGNOSIS — T2123XD Burn of second degree of upper back, subsequent encounter: Secondary | ICD-10-CM | POA: Diagnosis not present

## 2015-12-19 DIAGNOSIS — N390 Urinary tract infection, site not specified: Secondary | ICD-10-CM | POA: Diagnosis not present

## 2015-12-19 DIAGNOSIS — M1711 Unilateral primary osteoarthritis, right knee: Secondary | ICD-10-CM | POA: Diagnosis not present

## 2015-12-19 DIAGNOSIS — D631 Anemia in chronic kidney disease: Secondary | ICD-10-CM | POA: Diagnosis not present

## 2015-12-23 DIAGNOSIS — G894 Chronic pain syndrome: Secondary | ICD-10-CM | POA: Diagnosis not present

## 2015-12-23 DIAGNOSIS — Z79891 Long term (current) use of opiate analgesic: Secondary | ICD-10-CM | POA: Diagnosis not present

## 2015-12-23 DIAGNOSIS — Z79899 Other long term (current) drug therapy: Secondary | ICD-10-CM | POA: Diagnosis not present

## 2015-12-23 DIAGNOSIS — M4326 Fusion of spine, lumbar region: Secondary | ICD-10-CM | POA: Diagnosis not present

## 2015-12-24 ENCOUNTER — Ambulatory Visit
Admission: RE | Admit: 2015-12-24 | Discharge: 2015-12-24 | Disposition: A | Discharge status: Home / Self Care | Payer: Medicare Other | Source: Ambulatory Visit | Attending: Nurse Practitioner | Admitting: Nurse Practitioner

## 2015-12-24 DIAGNOSIS — N390 Urinary tract infection, site not specified: Secondary | ICD-10-CM | POA: Diagnosis not present

## 2015-12-24 DIAGNOSIS — T2123XD Burn of second degree of upper back, subsequent encounter: Secondary | ICD-10-CM | POA: Diagnosis not present

## 2015-12-24 DIAGNOSIS — D631 Anemia in chronic kidney disease: Secondary | ICD-10-CM | POA: Diagnosis not present

## 2015-12-24 DIAGNOSIS — M1711 Unilateral primary osteoarthritis, right knee: Secondary | ICD-10-CM | POA: Diagnosis not present

## 2015-12-24 DIAGNOSIS — K7469 Other cirrhosis of liver: Secondary | ICD-10-CM

## 2015-12-24 DIAGNOSIS — K7460 Unspecified cirrhosis of liver: Secondary | ICD-10-CM | POA: Diagnosis not present

## 2015-12-24 DIAGNOSIS — I129 Hypertensive chronic kidney disease with stage 1 through stage 4 chronic kidney disease, or unspecified chronic kidney disease: Secondary | ICD-10-CM | POA: Diagnosis not present

## 2015-12-24 DIAGNOSIS — N189 Chronic kidney disease, unspecified: Secondary | ICD-10-CM | POA: Diagnosis not present

## 2015-12-25 DIAGNOSIS — N390 Urinary tract infection, site not specified: Secondary | ICD-10-CM | POA: Diagnosis not present

## 2015-12-25 DIAGNOSIS — D631 Anemia in chronic kidney disease: Secondary | ICD-10-CM | POA: Diagnosis not present

## 2015-12-25 DIAGNOSIS — I129 Hypertensive chronic kidney disease with stage 1 through stage 4 chronic kidney disease, or unspecified chronic kidney disease: Secondary | ICD-10-CM | POA: Diagnosis not present

## 2015-12-25 DIAGNOSIS — N189 Chronic kidney disease, unspecified: Secondary | ICD-10-CM | POA: Diagnosis not present

## 2015-12-25 DIAGNOSIS — T2123XD Burn of second degree of upper back, subsequent encounter: Secondary | ICD-10-CM | POA: Diagnosis not present

## 2015-12-25 DIAGNOSIS — M1711 Unilateral primary osteoarthritis, right knee: Secondary | ICD-10-CM | POA: Diagnosis not present

## 2015-12-26 DIAGNOSIS — T2123XD Burn of second degree of upper back, subsequent encounter: Secondary | ICD-10-CM | POA: Diagnosis not present

## 2015-12-26 DIAGNOSIS — N390 Urinary tract infection, site not specified: Secondary | ICD-10-CM | POA: Diagnosis not present

## 2015-12-26 DIAGNOSIS — D631 Anemia in chronic kidney disease: Secondary | ICD-10-CM | POA: Diagnosis not present

## 2015-12-26 DIAGNOSIS — I129 Hypertensive chronic kidney disease with stage 1 through stage 4 chronic kidney disease, or unspecified chronic kidney disease: Secondary | ICD-10-CM | POA: Diagnosis not present

## 2015-12-26 DIAGNOSIS — M1711 Unilateral primary osteoarthritis, right knee: Secondary | ICD-10-CM | POA: Diagnosis not present

## 2015-12-26 DIAGNOSIS — N189 Chronic kidney disease, unspecified: Secondary | ICD-10-CM | POA: Diagnosis not present

## 2015-12-31 DIAGNOSIS — M1711 Unilateral primary osteoarthritis, right knee: Secondary | ICD-10-CM | POA: Diagnosis not present

## 2015-12-31 DIAGNOSIS — N189 Chronic kidney disease, unspecified: Secondary | ICD-10-CM | POA: Diagnosis not present

## 2015-12-31 DIAGNOSIS — I129 Hypertensive chronic kidney disease with stage 1 through stage 4 chronic kidney disease, or unspecified chronic kidney disease: Secondary | ICD-10-CM | POA: Diagnosis not present

## 2015-12-31 DIAGNOSIS — N390 Urinary tract infection, site not specified: Secondary | ICD-10-CM | POA: Diagnosis not present

## 2015-12-31 DIAGNOSIS — T2123XD Burn of second degree of upper back, subsequent encounter: Secondary | ICD-10-CM | POA: Diagnosis not present

## 2015-12-31 DIAGNOSIS — D631 Anemia in chronic kidney disease: Secondary | ICD-10-CM | POA: Diagnosis not present

## 2016-01-02 DIAGNOSIS — T2123XD Burn of second degree of upper back, subsequent encounter: Secondary | ICD-10-CM | POA: Diagnosis not present

## 2016-01-02 DIAGNOSIS — N189 Chronic kidney disease, unspecified: Secondary | ICD-10-CM | POA: Diagnosis not present

## 2016-01-02 DIAGNOSIS — N390 Urinary tract infection, site not specified: Secondary | ICD-10-CM | POA: Diagnosis not present

## 2016-01-02 DIAGNOSIS — M1711 Unilateral primary osteoarthritis, right knee: Secondary | ICD-10-CM | POA: Diagnosis not present

## 2016-01-02 DIAGNOSIS — D631 Anemia in chronic kidney disease: Secondary | ICD-10-CM | POA: Diagnosis not present

## 2016-01-02 DIAGNOSIS — I129 Hypertensive chronic kidney disease with stage 1 through stage 4 chronic kidney disease, or unspecified chronic kidney disease: Secondary | ICD-10-CM | POA: Diagnosis not present

## 2016-01-03 DIAGNOSIS — D631 Anemia in chronic kidney disease: Secondary | ICD-10-CM | POA: Diagnosis not present

## 2016-01-03 DIAGNOSIS — I129 Hypertensive chronic kidney disease with stage 1 through stage 4 chronic kidney disease, or unspecified chronic kidney disease: Secondary | ICD-10-CM | POA: Diagnosis not present

## 2016-01-03 DIAGNOSIS — N390 Urinary tract infection, site not specified: Secondary | ICD-10-CM | POA: Diagnosis not present

## 2016-01-03 DIAGNOSIS — T2123XD Burn of second degree of upper back, subsequent encounter: Secondary | ICD-10-CM | POA: Diagnosis not present

## 2016-01-03 DIAGNOSIS — N189 Chronic kidney disease, unspecified: Secondary | ICD-10-CM | POA: Diagnosis not present

## 2016-01-03 DIAGNOSIS — M1711 Unilateral primary osteoarthritis, right knee: Secondary | ICD-10-CM | POA: Diagnosis not present

## 2016-01-06 DIAGNOSIS — T2123XD Burn of second degree of upper back, subsequent encounter: Secondary | ICD-10-CM | POA: Diagnosis not present

## 2016-01-06 DIAGNOSIS — N189 Chronic kidney disease, unspecified: Secondary | ICD-10-CM | POA: Diagnosis not present

## 2016-01-06 DIAGNOSIS — D631 Anemia in chronic kidney disease: Secondary | ICD-10-CM | POA: Diagnosis not present

## 2016-01-06 DIAGNOSIS — M1711 Unilateral primary osteoarthritis, right knee: Secondary | ICD-10-CM | POA: Diagnosis not present

## 2016-01-06 DIAGNOSIS — I129 Hypertensive chronic kidney disease with stage 1 through stage 4 chronic kidney disease, or unspecified chronic kidney disease: Secondary | ICD-10-CM | POA: Diagnosis not present

## 2016-01-06 DIAGNOSIS — N390 Urinary tract infection, site not specified: Secondary | ICD-10-CM | POA: Diagnosis not present

## 2016-01-09 DIAGNOSIS — N390 Urinary tract infection, site not specified: Secondary | ICD-10-CM | POA: Diagnosis not present

## 2016-01-09 DIAGNOSIS — I129 Hypertensive chronic kidney disease with stage 1 through stage 4 chronic kidney disease, or unspecified chronic kidney disease: Secondary | ICD-10-CM | POA: Diagnosis not present

## 2016-01-09 DIAGNOSIS — M1711 Unilateral primary osteoarthritis, right knee: Secondary | ICD-10-CM | POA: Diagnosis not present

## 2016-01-09 DIAGNOSIS — D631 Anemia in chronic kidney disease: Secondary | ICD-10-CM | POA: Diagnosis not present

## 2016-01-09 DIAGNOSIS — N189 Chronic kidney disease, unspecified: Secondary | ICD-10-CM | POA: Diagnosis not present

## 2016-01-09 DIAGNOSIS — T2123XD Burn of second degree of upper back, subsequent encounter: Secondary | ICD-10-CM | POA: Diagnosis not present

## 2016-01-10 DIAGNOSIS — I129 Hypertensive chronic kidney disease with stage 1 through stage 4 chronic kidney disease, or unspecified chronic kidney disease: Secondary | ICD-10-CM | POA: Diagnosis not present

## 2016-01-10 DIAGNOSIS — T2123XA Burn of second degree of upper back, initial encounter: Secondary | ICD-10-CM | POA: Diagnosis not present

## 2016-01-10 DIAGNOSIS — M109 Gout, unspecified: Secondary | ICD-10-CM | POA: Diagnosis not present

## 2016-01-10 DIAGNOSIS — X16XXXD Contact with hot heating appliances, radiators and pipes, subsequent encounter: Secondary | ICD-10-CM | POA: Diagnosis not present

## 2016-01-10 DIAGNOSIS — E039 Hypothyroidism, unspecified: Secondary | ICD-10-CM | POA: Diagnosis not present

## 2016-01-10 DIAGNOSIS — Z87440 Personal history of urinary (tract) infections: Secondary | ICD-10-CM | POA: Diagnosis not present

## 2016-01-10 DIAGNOSIS — M1711 Unilateral primary osteoarthritis, right knee: Secondary | ICD-10-CM | POA: Diagnosis not present

## 2016-01-10 DIAGNOSIS — Z7982 Long term (current) use of aspirin: Secondary | ICD-10-CM | POA: Diagnosis not present

## 2016-01-10 DIAGNOSIS — Z9181 History of falling: Secondary | ICD-10-CM | POA: Diagnosis not present

## 2016-01-10 DIAGNOSIS — Z4800 Encounter for change or removal of nonsurgical wound dressing: Secondary | ICD-10-CM | POA: Diagnosis not present

## 2016-01-10 DIAGNOSIS — N189 Chronic kidney disease, unspecified: Secondary | ICD-10-CM | POA: Diagnosis not present

## 2016-01-10 DIAGNOSIS — D631 Anemia in chronic kidney disease: Secondary | ICD-10-CM | POA: Diagnosis not present

## 2016-01-22 DIAGNOSIS — J301 Allergic rhinitis due to pollen: Secondary | ICD-10-CM | POA: Diagnosis not present

## 2016-01-22 DIAGNOSIS — Z6835 Body mass index (BMI) 35.0-35.9, adult: Secondary | ICD-10-CM | POA: Diagnosis not present

## 2016-01-22 DIAGNOSIS — T2103XS Burn of unspecified degree of upper back, sequela: Secondary | ICD-10-CM | POA: Diagnosis not present

## 2016-01-22 DIAGNOSIS — J029 Acute pharyngitis, unspecified: Secondary | ICD-10-CM | POA: Diagnosis not present

## 2016-02-03 DIAGNOSIS — I129 Hypertensive chronic kidney disease with stage 1 through stage 4 chronic kidney disease, or unspecified chronic kidney disease: Secondary | ICD-10-CM | POA: Diagnosis not present

## 2016-02-03 DIAGNOSIS — T2123XA Burn of second degree of upper back, initial encounter: Secondary | ICD-10-CM | POA: Diagnosis not present

## 2016-02-03 DIAGNOSIS — M1711 Unilateral primary osteoarthritis, right knee: Secondary | ICD-10-CM | POA: Diagnosis not present

## 2016-02-03 DIAGNOSIS — M109 Gout, unspecified: Secondary | ICD-10-CM | POA: Diagnosis not present

## 2016-02-03 DIAGNOSIS — D631 Anemia in chronic kidney disease: Secondary | ICD-10-CM | POA: Diagnosis not present

## 2016-02-03 DIAGNOSIS — N189 Chronic kidney disease, unspecified: Secondary | ICD-10-CM | POA: Diagnosis not present

## 2016-02-09 DIAGNOSIS — M109 Gout, unspecified: Secondary | ICD-10-CM | POA: Diagnosis not present

## 2016-02-09 DIAGNOSIS — M1711 Unilateral primary osteoarthritis, right knee: Secondary | ICD-10-CM | POA: Diagnosis not present

## 2016-02-09 DIAGNOSIS — I129 Hypertensive chronic kidney disease with stage 1 through stage 4 chronic kidney disease, or unspecified chronic kidney disease: Secondary | ICD-10-CM | POA: Diagnosis not present

## 2016-02-09 DIAGNOSIS — N189 Chronic kidney disease, unspecified: Secondary | ICD-10-CM | POA: Diagnosis not present

## 2016-02-09 DIAGNOSIS — D631 Anemia in chronic kidney disease: Secondary | ICD-10-CM | POA: Diagnosis not present

## 2016-02-09 DIAGNOSIS — T2123XA Burn of second degree of upper back, initial encounter: Secondary | ICD-10-CM | POA: Diagnosis not present

## 2016-02-14 DIAGNOSIS — M7061 Trochanteric bursitis, right hip: Secondary | ICD-10-CM | POA: Diagnosis not present

## 2016-02-14 DIAGNOSIS — G894 Chronic pain syndrome: Secondary | ICD-10-CM | POA: Diagnosis not present

## 2016-02-14 DIAGNOSIS — M4326 Fusion of spine, lumbar region: Secondary | ICD-10-CM | POA: Diagnosis not present

## 2016-02-20 DIAGNOSIS — D631 Anemia in chronic kidney disease: Secondary | ICD-10-CM | POA: Diagnosis not present

## 2016-02-20 DIAGNOSIS — M1711 Unilateral primary osteoarthritis, right knee: Secondary | ICD-10-CM | POA: Diagnosis not present

## 2016-02-20 DIAGNOSIS — T2123XA Burn of second degree of upper back, initial encounter: Secondary | ICD-10-CM | POA: Diagnosis not present

## 2016-02-20 DIAGNOSIS — I129 Hypertensive chronic kidney disease with stage 1 through stage 4 chronic kidney disease, or unspecified chronic kidney disease: Secondary | ICD-10-CM | POA: Diagnosis not present

## 2016-02-20 DIAGNOSIS — N189 Chronic kidney disease, unspecified: Secondary | ICD-10-CM | POA: Diagnosis not present

## 2016-02-20 DIAGNOSIS — M109 Gout, unspecified: Secondary | ICD-10-CM | POA: Diagnosis not present

## 2016-02-21 DIAGNOSIS — N183 Chronic kidney disease, stage 3 (moderate): Secondary | ICD-10-CM | POA: Diagnosis not present

## 2016-02-21 DIAGNOSIS — D649 Anemia, unspecified: Secondary | ICD-10-CM | POA: Diagnosis not present

## 2016-02-26 DIAGNOSIS — M109 Gout, unspecified: Secondary | ICD-10-CM | POA: Diagnosis not present

## 2016-02-26 DIAGNOSIS — D631 Anemia in chronic kidney disease: Secondary | ICD-10-CM | POA: Diagnosis not present

## 2016-02-26 DIAGNOSIS — I129 Hypertensive chronic kidney disease with stage 1 through stage 4 chronic kidney disease, or unspecified chronic kidney disease: Secondary | ICD-10-CM | POA: Diagnosis not present

## 2016-02-26 DIAGNOSIS — M1711 Unilateral primary osteoarthritis, right knee: Secondary | ICD-10-CM | POA: Diagnosis not present

## 2016-02-26 DIAGNOSIS — N189 Chronic kidney disease, unspecified: Secondary | ICD-10-CM | POA: Diagnosis not present

## 2016-02-26 DIAGNOSIS — T2123XA Burn of second degree of upper back, initial encounter: Secondary | ICD-10-CM | POA: Diagnosis not present

## 2016-02-27 DIAGNOSIS — E611 Iron deficiency: Secondary | ICD-10-CM | POA: Diagnosis not present

## 2016-02-27 DIAGNOSIS — I10 Essential (primary) hypertension: Secondary | ICD-10-CM | POA: Diagnosis not present

## 2016-02-27 DIAGNOSIS — F0781 Postconcussional syndrome: Secondary | ICD-10-CM | POA: Diagnosis not present

## 2016-02-27 DIAGNOSIS — N183 Chronic kidney disease, stage 3 (moderate): Secondary | ICD-10-CM | POA: Diagnosis not present

## 2016-02-27 DIAGNOSIS — E1129 Type 2 diabetes mellitus with other diabetic kidney complication: Secondary | ICD-10-CM | POA: Diagnosis not present

## 2016-02-27 DIAGNOSIS — E669 Obesity, unspecified: Secondary | ICD-10-CM | POA: Diagnosis not present

## 2016-02-27 DIAGNOSIS — R7989 Other specified abnormal findings of blood chemistry: Secondary | ICD-10-CM | POA: Diagnosis not present

## 2016-02-27 DIAGNOSIS — B1920 Unspecified viral hepatitis C without hepatic coma: Secondary | ICD-10-CM | POA: Diagnosis not present

## 2016-04-01 DIAGNOSIS — R197 Diarrhea, unspecified: Secondary | ICD-10-CM | POA: Diagnosis not present

## 2016-04-01 DIAGNOSIS — Z01818 Encounter for other preprocedural examination: Secondary | ICD-10-CM | POA: Diagnosis not present

## 2016-04-01 DIAGNOSIS — Z1211 Encounter for screening for malignant neoplasm of colon: Secondary | ICD-10-CM | POA: Diagnosis not present

## 2016-04-01 DIAGNOSIS — R32 Unspecified urinary incontinence: Secondary | ICD-10-CM | POA: Diagnosis not present

## 2016-04-09 ENCOUNTER — Encounter: Payer: Self-pay | Admitting: Neurology

## 2016-04-09 ENCOUNTER — Ambulatory Visit (INDEPENDENT_AMBULATORY_CARE_PROVIDER_SITE_OTHER): Payer: Medicare Other | Admitting: Neurology

## 2016-04-09 VITALS — BP 167/74 | HR 73 | Ht 64.0 in | Wt 204.6 lb

## 2016-04-09 DIAGNOSIS — R110 Nausea: Secondary | ICD-10-CM | POA: Diagnosis not present

## 2016-04-09 DIAGNOSIS — G43909 Migraine, unspecified, not intractable, without status migrainosus: Secondary | ICD-10-CM | POA: Insufficient documentation

## 2016-04-09 DIAGNOSIS — G43009 Migraine without aura, not intractable, without status migrainosus: Secondary | ICD-10-CM

## 2016-04-09 MED ORDER — SUMATRIPTAN SUCCINATE 100 MG PO TABS: 100.0000 mg | ORAL_TABLET | Freq: Once | ORAL | Status: DC | PRN

## 2016-04-09 MED ORDER — AMITRIPTYLINE HCL 25 MG PO TABS: 50.0000 mg | ORAL_TABLET | Freq: Every day | ORAL | Status: DC

## 2016-04-09 MED ORDER — ONDANSETRON 4 MG PO TBDP: 4.0000 mg | ORAL_TABLET | Freq: Three times a day (TID) | ORAL | Status: DC | PRN

## 2016-04-09 NOTE — Progress Notes (Signed)
[id number]    Provider:  Dr Jaynee Eagles Referring Provider: Marton Redwood, MD Primary Care Physician:  Marton Redwood, MD  CC: migraine  Interval history 04/09/2016: She still has migraines. She is having 2-3 migraines a week. They are on the right side, thumping, pounding. She takes an imitrex or an [place] and goes to bed. They are lasting hours. She is not taking the topiramate, she had kidney failure and stopped taking. Sh eis having a lot of insomnia. She takes gabapentin and she has to take [place]. She has a significant problem sleeping. Migraines with some nausea. She has significant insomnia, talked about this a little, good sleep habits. Discussed and insomnia counselor. We'll start amitriptyline at night for migraine prevention which can help with insomnia as well. Discussed side effects such as QT prolongation, somnolence, fatigue, risk of falls and others per patient instructions.  Interval history: Migraines are stable. She is still having them once a week. She takes the triptan then they go away. For acute management, she takes just one triptan. Then she sleeps and it goes away. She hasn't really been taking the Topamax, she said it made her feel like she had cognitive problems. Discussed that Topamax can do and that we could probably decrease the dose. Imitrex helps but it does not make the headache go away, she still has to sleep for a while. Discussed trying some samples of Treximet or Relpax which might work better but do not use them together. Use them both just like the Imitrex, at onset of headache may repeat in 2 hours do not use more than 2 times in 24 hours or 2 days a week. She also has Qysmia on her med list for weight loss, I did advise her that this drug does have Topamax and is well so to discuss that with prescribing physician so as not to unknowingly increased dosage of one medication.  Previous History Dr. Krista Blue:  Becky Hobbs is a 70 years old right-handed  African American female, referred by her primary care physician Dr. Marton Redwood for evaluation of headaches, concussion, and gait difficulty, initial visit was in July 2014.   She had past medical history of hypertension, diabetes, hypothyroidism, in May 12th 2014, she came downstairs, stepped into a puddle of water, fell backwards, landed on her occipital area, there was no loss of consciousness, she could go on cleaning the floor, it was a leakage from 3 gallon water container, she has no difficulty completing the task, afterwards, she drove to her nephew's house, by then she was noticed to stumbled over, couple days later, she began to have frequent headaches, vertex bilateral retro-orbital area severe pounding headaches with associated light noise sensitivity, she also complains of constant gait difficulty, bilateral lower extremity tremorish sensation when bearing weight,   She also has difficulty sleeping, her headache are pouding, movements make it worse, with associated light noise sensitivity, she denies visual loss, she complains of neck pain, radiating pain to her bilateral shoulder. She denies bowel and bladder incontinence   CT head without contrast in June 2014 showed no acute lesions,  MRI of cervical spine in September 2013 evaluating for gait difficulty, bilateral upper extremity paresthesia showed multilevel degenerative disc disease, most severe C5-6, C6 and 7 with mild canal stenosis, mild to moderate left more than right foraminal stenosis.   04/21/13 scan of cervical spine showing prominent spondylitic changes most prominent at C5-6 and C6-7 where there is a broad-based disc osteophyte complexes resulting in mild  canal and moderate left greater than right foraminal narrowing. Overall no significant changes compared with previous MRI scan dated 07/01/2012.    UPDATE Sept 29th 2015:  Her headache overall has much improved, instead of daily headaches, she is having headaches 2-3  times each week, starting at the right parietal area, pounding, with associated light noise sensitivity, nauseous, movements make it worse, she preferred to lie down in dark quiet room, lasting half days,  She did have a history of migraine headaches in the past  Trigger for her headaches are stress, sleep deprivation, insomnia, she continued having low back pain, has been receving epidural injections by Dr. Maia Petties  ROS: Patient complains of the following review of systems, endorses blurry vision, runny nose, insomnia, incontinence of bladder, back pain, headache, decreased concentration. All others negative.  Social History   Social History  . Marital Status: Widowed    Spouse Name: N/A  . Number of Children: 3  . Years of Education: college   Occupational History  . retired     Retired   Social History Main Topics  . Smoking status: Never Smoker   . Smokeless tobacco: Never Used  . Alcohol Use: No  . Drug Use: No  . Sexual Activity: No   Other Topics Concern  . Not on file   Social History Narrative   Patient has a high school education.    Patient is widow.   Patient is retired.    Family History  Problem Relation Age of Onset  . Kidney disease Mother   . Vascular Disease Mother     Past Medical History  Diagnosis Date  . Hypertension   . H/O hiatal hernia     surgery fixed  . Diabetes mellitus     borderline  . Arthritis   . Anxiety     panic attacks  . Asthma   . Hypothyroidism   . Heart murmur     MVP  . Blood transfusion     1973--with twins birth    Past Surgical History  Procedure Laterality Date  . Cholecystectomy    . Back surgery    . Tonsillectomy    . Appendectomy    . Abdominal hysterectomy    . Bladder tack    . Breast cyst incision and drainage    . Breast surgery    . Ovarian cyst surgery    . Nasal septum surgery    . Fracture surgery      left wrist  . [place] fundoplication    . Bunionectomy    . Lumbar laminectomy   03/04/2012    Procedure: MICRODISCECTOMY LUMBAR LAMINECTOMY;  Surgeon: Jessy Oto, MD;  Location: [facility];  Service: Orthopedics;  Laterality: N/A;  L3-4 central laminectomy    Current Outpatient Prescriptions  Medication Sig Dispense Refill  . albuterol (PROVENTIL HFA;VENTOLIN HFA) 108 (90 BASE) MCG/ACT inhaler Inhale 2 puffs into the lungs every 6 (six) hours as needed for wheezing or shortness of breath. 1 Inhaler 6  . allopurinol (ZYLOPRIM) 100 MG tablet Take 100 mg by mouth daily.    Marland Kitchen ALPRAZolam (XANAX) 0.5 MG tablet Take 0.5 mg by mouth daily as needed for anxiety.     Marland Kitchen aspirin EC 81 MG tablet Take 81 mg by mouth daily.    Marland Kitchen bismuth subsalicylate (PEPTO BISMOL) 262 MG/15ML suspension Take 30 mLs by mouth every 6 (six) hours as needed for indigestion or diarrhea or loose stools.    . cetirizine (ZYRTEC)  10 MG tablet Take 10 mg by mouth daily.    . Cholecalciferol 2000 UNITS CAPS Take 4,000 Units by mouth daily.    Marland Kitchen EPINEPHrine (EPIPEN 2-PAK) 0.3 mg/0.3 mL IJ SOAJ injection Inject 0.3 mLs (0.3 mg total) into the skin once. (Patient taking differently: Inject 0.3 mg into the skin daily as needed (allergic reaction). ) 2 Device 4  . gabapentin (NEURONTIN) 300 MG capsule Take 2 capsules (600 mg total) by mouth at bedtime. 30 capsule 0  . levothyroxine (SYNTHROID, LEVOTHROID) 75 MCG tablet Take 75 mcg by mouth daily.    Marland Kitchen losartan (COZAAR) 100 MG tablet Take 100 mg by mouth daily.    . metoprolol succinate (TOPROL-XL) 100 MG 24 hr tablet Take 100 mg by mouth daily. Take with or immediately following a meal.    . Multiple Vitamin (MULITIVITAMIN WITH MINERALS) TABS Take 1 tablet by mouth daily.    Marland Kitchen olopatadine (PATANOL) 0.1 % ophthalmic solution Place 1 drop into both eyes 2 (two) times daily as needed for allergies.     Marland Kitchen ondansetron (ZOFRAN) 4 MG tablet Take 1 tablet (4 mg total) by mouth every 8 (eight) hours as needed for nausea. 30 tablet 6  . oxyCODONE-acetaminophen (PERCOCET/ROXICET)  5-325 MG per tablet Take 1 tablet by mouth every 6 (six) hours as needed for moderate pain.   0  . potassium chloride SA (K-DUR,KLOR-CON) 20 MEQ tablet Take 20 mEq by mouth daily as needed (fluid). Takes with torsemide.    . Probiotic Product (PROBIOTIC PO) Take 1 capsule by mouth daily.    . silver sulfADIAZINE (SILVADENE) 1 % cream Apply topically 2 (two) times daily. 50 g 0  . SUMAtriptan (IMITREX) 50 MG tablet TK 1 T PO QD PRF MIGRAINES  3  . tiZANidine (ZANAFLEX) 2 MG tablet TK 1 TO 2 T PO Q 6 H PRF SPASM  0  . torsemide (DEMADEX) 20 MG tablet Take 1 tablet (20 mg total) by mouth daily as needed (fluid). For edema. 30 tablet 0  . zolpidem (AMBIEN) 10 MG tablet Take 10 mg by mouth at bedtime as needed for sleep.      No current facility-administered medications for this visit.    Allergies as of 04/09/2016 - Review Complete 04/09/2016  Allergen Reaction Noted  . Morphine and related Itching and Other (See Comments) 03/04/2012  . Other Anaphylaxis 07/10/2014  . Erythromycin Hives and Itching 05/12/2012  . Hydrocodone Itching 03/01/2012  . Penicillins Hives 03/01/2012  . Tramadol hcl Itching 01/04/2014  . Hydrocodone-acetaminophen Itching 07/10/2014    Vitals: BP 167/74 mmHg  Pulse 73  Ht 5\' 4"  (1.626 m)  Wt 204 lb 9.6 oz (92.806 kg)  BMI 35.10 kg/m2 Last Weight:  Wt Readings from Last 1 Encounters:  04/09/16 204 lb 9.6 oz (92.806 kg)   Last Height:   Ht Readings from Last 1 Encounters:  04/09/16 5\' 4"  (1.626 m)     Exam: Gen: NAD, conversant, well nourised, obese, well groomed  CV: RRR, no MRG. No Carotid Bruits. No peripheral edema, warm, nontender Eyes: Conjunctivae clear without exudates or hemorrhage  Neuro: Detailed Neurologic Exam  Speech:  Speech is normal; fluent and spontaneous with normal comprehension.  Cognition:  The patient is oriented to person, place, and time;   recent and remote memory intact;   language fluent;    normal attention, concentration,   fund of knowledge Cranial Nerves:  The pupils are equal, round, and reactive to light. The fundi are normal and  spontaneous venous pulsations are present. Visual fields are full to finger confrontation. Extraocular movements are intact. Trigeminal sensation is intact and the muscles of mastication are normal. The face is symmetric. The palate elevates in the midline. Hearing intact. Voice is normal. Shoulder shrug is normal. The tongue has normal motion without fasciculations.   Coordination:  Normal finger to nose and heel to shin. Normal rapid alternating movements.   Gait:  Heel-toe and tandem gait are normal.   Motor Observation:  No asymmetry, no atrophy, and no involuntary movements noted. Tone:  Normal muscle tone.   Posture:  Posture is normal. normal erect   Strength:  Strength is V/V in the upper and lower limbs.    Sensation: intact   Reflex Exam:  DTR's:  Deep tendon reflexes in the upper and lower extremities are brisk but symmetric bilaterally.  Toes:  The toes are downgoing bilaterally.  Clonus:  Clonus is absent.   Assessment/Plan: 70 year old female here for follow up of migraines.    As far as your medications are concerned, I would like to suggest: Amitriptyline every night before bed. Start with one pill at bedtime and in 2 weeks can increase to 2 pills at night. May help with insomnia as well as migraine. Imitrex at the onset of migraine. Can repeat in 2 hours, no more than 2x in a day Zofran for nausea Discussed side effects as documented in the patient instruction formed  Follow-up 6 months   Sarina Ill, MD  [facility] [address] [place], [place] [address]  Phone [phone] Fax [phone]  A total of 30  minutes was spent face-to-face with this patient. Over half this time was spent on counseling patient on the  migraine diagnosis and different diagnostic and therapeutic options available.

## 2016-04-09 NOTE — Patient Instructions (Addendum)
Remember to drink plenty of fluid, eat healthy meals and do not skip any meals. Try to eat protein with a every meal and eat a healthy snack such as fruit or nuts in between meals. Try to keep a regular sleep-wake schedule and try to exercise daily, particularly in the form of walking, 20-30 minutes a day, if you can.   As far as your medications are concerned, I would like to suggest: Amitriptyline every night before bed. Start with one pill at bedtime and in 2 weeks can increase to 2 pills at night.  Imitrex at the onset of migraine. Can repeat in 2 hours, no more than 2x in a day Zofran for nausea  I would like to see you back in 6 months, sooner if we need to. Please call us with any interim questions, concerns, problems, updates or refill requests.   Our phone number is [phone]. We also have an after hours call service for urgent matters and there is a physician on-call for urgent questions. For any emergencies you know to call 911 or go to the nearest emergency room  Amitriptyline tablets What is this medicine? AMITRIPTYLINE (a mee TRIP ti leen) is used to treat depression. This medicine may be used for other purposes; ask your health care provider or pharmacist if you have questions. What should I tell my health care provider before I take this medicine? They need to know if you have any of these conditions: -an alcohol problem -asthma, difficulty breathing -bipolar disorder or schizophrenia -difficulty passing urine, prostate trouble -glaucoma -heart disease or previous heart attack -liver disease -over active thyroid -seizures -thoughts or plans of suicide, a previous suicide attempt, or family history of suicide attempt -an unusual or allergic reaction to amitriptyline, other medicines, foods, dyes, or preservatives -pregnant or trying to get pregnant -breast-feeding How should I use this medicine? Take this medicine by mouth with a drink of water. Follow the directions  on the prescription label. You can take the tablets with or without food. Take your medicine at regular intervals. Do not take it more often than directed. Do not stop taking this medicine suddenly except upon the advice of your doctor. Stopping this medicine too quickly may cause serious side effects or your condition may worsen. A special MedGuide will be given to you by the pharmacist with each prescription and refill. Be sure to read this information carefully each time. Talk to your pediatrician regarding the use of this medicine in children. Special care may be needed. Overdosage: If you think you have taken too much of this medicine contact a poison control center or emergency room at once. NOTE: This medicine is only for you. Do not share this medicine with others. What if I miss a dose? If you miss a dose, take it as soon as you can. If it is almost time for your next dose, take only that dose. Do not take double or extra doses. What may interact with this medicine? Do not take this medicine with any of the following medications: -arsenic trioxide -certain medicines used to regulate abnormal heartbeat or to treat other heart conditions -cisapride -droperidol -halofantrine -linezolid -MAOIs like Carbex, Eldepryl, Marplan, Nardil, and Parnate -methylene blue -other medicines for mental depression -phenothiazines like perphenazine, thioridazine and chlorpromazine -pimozide -probucol -procarbazine -sparfloxacin -St. John's Wort -ziprasidone This medicine may also interact with the following medications: -atropine and related drugs like hyoscyamine, scopolamine, tolterodine and others -barbiturate medicines for inducing sleep or treating seizures, like phenobarbital -  cimetidine -disulfiram -ethchlorvynol -thyroid hormones such as levothyroxine This list may not describe all possible interactions. Give your health care provider a list of all the medicines, herbs, non-prescription  drugs, or dietary supplements you use. Also tell them if you smoke, drink alcohol, or use illegal drugs. Some items may interact with your medicine. What should I watch for while using this medicine? Tell your doctor if your symptoms do not get better or if they get worse. Visit your doctor or health care professional for regular checks on your progress. Because it may take several weeks to see the full effects of this medicine, it is important to continue your treatment as prescribed by your doctor. Patients and their families should watch out for new or worsening thoughts of suicide or depression. Also watch out for sudden changes in feelings such as feeling anxious, agitated, panicky, irritable, hostile, aggressive, impulsive, severely restless, overly excited and hyperactive, or not being able to sleep. If this happens, especially at the beginning of treatment or after a change in dose, call your health care professional. Dennis Bast may get drowsy or dizzy. Do not drive, use machinery, or do anything that needs mental alertness until you know how this medicine affects you. Do not stand or sit up quickly, especially if you are an older patient. This reduces the risk of dizzy or fainting spells. Alcohol may interfere with the effect of this medicine. Avoid alcoholic drinks. Do not treat yourself for coughs, colds, or allergies without asking your doctor or health care professional for advice. Some ingredients can increase possible side effects. Your mouth may get dry. Chewing sugarless gum or sucking hard candy, and drinking plenty of water will help. Contact your doctor if the problem does not go away or is severe. This medicine may cause dry eyes and blurred vision. If you wear contact lenses you may feel some discomfort. Lubricating drops may help. See your eye doctor if the problem does not go away or is severe. This medicine can cause constipation. Try to have a bowel movement at least every 2 to 3 days. If  you do not have a bowel movement for 3 days, call your doctor or health care professional. This medicine can make you more sensitive to the sun. Keep out of the sun. If you cannot avoid being in the sun, wear protective clothing and use sunscreen. Do not use sun lamps or tanning beds/booths. What side effects may I notice from receiving this medicine? Side effects that you should report to your doctor or health care professional as soon as possible: -allergic reactions like skin rash, itching or hives, swelling of the face, lips, or tongue -abnormal production of milk in females -breast enlargement in both males and females -breathing problems -confusion, hallucinations -fast, irregular heartbeat -fever with increased sweating -muscle stiffness, or spasms -pain or difficulty passing urine, loss of bladder control -seizures -suicidal thoughts or other mood changes -swelling of the testicles -tingling, pain, or numbness in the feet or hands -yellowing of the eyes or skin Side effects that usually do not require medical attention (report to your doctor or health care professional if they continue or are bothersome): -change in sex drive or performance -constipation or diarrhea -nausea, vomiting -weight gain or loss This list may not describe all possible side effects. Call your doctor for medical advice about side effects. You may report side effects to FDA at 1-800-FDA-1088. Where should I keep my medicine? Keep out of the reach of children. Store at room  temperature between 20 and 25 degrees C (68 and 77 degrees F). Throw away any unused medicine after the expiration date. NOTE: This sheet is a summary. It may not cover all possible information. If you have questions about this medicine, talk to your doctor, pharmacist, or health care provider.    2016, Elsevier/[employer] Standard. (2012-02-15 13:50:32)

## 2016-04-13 DIAGNOSIS — M545 Low back pain: Secondary | ICD-10-CM | POA: Diagnosis not present

## 2016-04-22 ENCOUNTER — Telehealth: Payer: Self-pay | Admitting: *Deleted

## 2016-04-22 NOTE — Telephone Encounter (Signed)
Received PA request via fax from [place]. I called and spoke to [place] via pt insurance. I called [phone]. She stated there are no other formulary alternatives for amitriptyline. That question does not apply on the high risk medication coverage determination form that has to be filled out. Med needs PA d/t being high risk for pt age. She stated Dr Jaynee Eagles needs to answer each question and then it needs to be faxed back.

## 2016-04-23 NOTE — Telephone Encounter (Signed)
Faxed compelted PA form by D rAhern back to [employer]. Fax: [phone]. Received confirmation. Awaiting response.

## 2016-04-28 NOTE — Addendum Note (Signed)
Addended by: Marval Regal on: 04/28/2016 08:53 AM   Modules accepted: Medications

## 2016-04-28 NOTE — Telephone Encounter (Signed)
PA APPROVE FROM 04/23/2016 TO 10/11/2016 FROM [employer] FOR QUANTITY  OF 60  PER 30 DAYS.

## 2016-05-05 DIAGNOSIS — Z1211 Encounter for screening for malignant neoplasm of colon: Secondary | ICD-10-CM | POA: Diagnosis not present

## 2016-05-05 DIAGNOSIS — K5730 Diverticulosis of large intestine without perforation or abscess without bleeding: Secondary | ICD-10-CM | POA: Diagnosis not present

## 2016-05-08 DIAGNOSIS — R159 Full incontinence of feces: Secondary | ICD-10-CM | POA: Diagnosis not present

## 2016-05-08 DIAGNOSIS — K6289 Other specified diseases of anus and rectum: Secondary | ICD-10-CM | POA: Diagnosis not present

## 2016-06-22 DIAGNOSIS — G894 Chronic pain syndrome: Secondary | ICD-10-CM | POA: Diagnosis not present

## 2016-06-22 DIAGNOSIS — M545 Low back pain: Secondary | ICD-10-CM | POA: Diagnosis not present

## 2016-06-29 DIAGNOSIS — M25561 Pain in right knee: Secondary | ICD-10-CM | POA: Diagnosis not present

## 2016-06-29 DIAGNOSIS — L739 Follicular disorder, unspecified: Secondary | ICD-10-CM | POA: Diagnosis not present

## 2016-07-10 DIAGNOSIS — N390 Urinary tract infection, site not specified: Secondary | ICD-10-CM | POA: Diagnosis not present

## 2016-07-10 DIAGNOSIS — R8299 Other abnormal findings in urine: Secondary | ICD-10-CM | POA: Diagnosis not present

## 2016-07-10 DIAGNOSIS — M109 Gout, unspecified: Secondary | ICD-10-CM | POA: Diagnosis not present

## 2016-07-10 DIAGNOSIS — E784 Other hyperlipidemia: Secondary | ICD-10-CM | POA: Diagnosis not present

## 2016-07-10 DIAGNOSIS — I10 Essential (primary) hypertension: Secondary | ICD-10-CM | POA: Diagnosis not present

## 2016-07-10 DIAGNOSIS — E038 Other specified hypothyroidism: Secondary | ICD-10-CM | POA: Diagnosis not present

## 2016-07-10 DIAGNOSIS — E119 Type 2 diabetes mellitus without complications: Secondary | ICD-10-CM | POA: Diagnosis not present

## 2016-07-15 DIAGNOSIS — Z6834 Body mass index (BMI) 34.0-34.9, adult: Secondary | ICD-10-CM | POA: Diagnosis not present

## 2016-07-15 DIAGNOSIS — R197 Diarrhea, unspecified: Secondary | ICD-10-CM | POA: Diagnosis not present

## 2016-07-15 DIAGNOSIS — R159 Full incontinence of feces: Secondary | ICD-10-CM | POA: Diagnosis not present

## 2016-07-16 ENCOUNTER — Other Ambulatory Visit: Payer: Self-pay | Admitting: Nurse Practitioner

## 2016-07-16 DIAGNOSIS — K7469 Other cirrhosis of liver: Secondary | ICD-10-CM | POA: Diagnosis not present

## 2016-07-16 DIAGNOSIS — K7460 Unspecified cirrhosis of liver: Secondary | ICD-10-CM | POA: Diagnosis not present

## 2016-07-16 DIAGNOSIS — B182 Chronic viral hepatitis C: Secondary | ICD-10-CM | POA: Diagnosis not present

## 2016-07-17 DIAGNOSIS — N390 Urinary tract infection, site not specified: Secondary | ICD-10-CM | POA: Diagnosis not present

## 2016-07-17 DIAGNOSIS — N184 Chronic kidney disease, stage 4 (severe): Secondary | ICD-10-CM | POA: Diagnosis not present

## 2016-07-17 DIAGNOSIS — Z0000 Encounter for general adult medical examination without abnormal findings: Secondary | ICD-10-CM | POA: Diagnosis not present

## 2016-07-17 DIAGNOSIS — E038 Other specified hypothyroidism: Secondary | ICD-10-CM | POA: Diagnosis not present

## 2016-07-17 DIAGNOSIS — Z6835 Body mass index (BMI) 35.0-35.9, adult: Secondary | ICD-10-CM | POA: Diagnosis not present

## 2016-07-17 DIAGNOSIS — Z1389 Encounter for screening for other disorder: Secondary | ICD-10-CM | POA: Diagnosis not present

## 2016-07-17 DIAGNOSIS — K7469 Other cirrhosis of liver: Secondary | ICD-10-CM | POA: Diagnosis not present

## 2016-07-17 DIAGNOSIS — I10 Essential (primary) hypertension: Secondary | ICD-10-CM | POA: Diagnosis not present

## 2016-07-17 DIAGNOSIS — E119 Type 2 diabetes mellitus without complications: Secondary | ICD-10-CM | POA: Diagnosis not present

## 2016-07-17 DIAGNOSIS — Z23 Encounter for immunization: Secondary | ICD-10-CM | POA: Diagnosis not present

## 2016-07-17 DIAGNOSIS — E784 Other hyperlipidemia: Secondary | ICD-10-CM | POA: Diagnosis not present

## 2016-07-23 DIAGNOSIS — Z1212 Encounter for screening for malignant neoplasm of rectum: Secondary | ICD-10-CM | POA: Diagnosis not present

## 2016-07-30 ENCOUNTER — Ambulatory Visit
Admission: RE | Admit: 2016-07-30 | Discharge: 2016-07-30 | Disposition: A | Discharge status: Home / Self Care | Payer: Medicare Other | Source: Ambulatory Visit | Attending: Nurse Practitioner | Admitting: Nurse Practitioner

## 2016-07-30 DIAGNOSIS — Z6841 Body Mass Index (BMI) 40.0 and over, adult: Secondary | ICD-10-CM | POA: Diagnosis not present

## 2016-07-30 DIAGNOSIS — K7469 Other cirrhosis of liver: Secondary | ICD-10-CM

## 2016-07-30 DIAGNOSIS — K7460 Unspecified cirrhosis of liver: Secondary | ICD-10-CM | POA: Diagnosis not present

## 2016-07-30 DIAGNOSIS — M4326 Fusion of spine, lumbar region: Secondary | ICD-10-CM | POA: Diagnosis not present

## 2016-07-30 DIAGNOSIS — M7062 Trochanteric bursitis, left hip: Secondary | ICD-10-CM | POA: Diagnosis not present

## 2016-07-30 DIAGNOSIS — I10 Essential (primary) hypertension: Secondary | ICD-10-CM | POA: Diagnosis not present

## 2016-08-03 ENCOUNTER — Encounter (INDEPENDENT_AMBULATORY_CARE_PROVIDER_SITE_OTHER): Payer: Self-pay | Admitting: Specialist

## 2016-08-03 ENCOUNTER — Ambulatory Visit (INDEPENDENT_AMBULATORY_CARE_PROVIDER_SITE_OTHER): Payer: Medicare Other | Admitting: Specialist

## 2016-08-03 DIAGNOSIS — Z981 Arthrodesis status: Secondary | ICD-10-CM | POA: Diagnosis not present

## 2016-08-03 NOTE — Patient Instructions (Addendum)
Avoid bending and stooping . Avoid lifting weight greater than 10-15 lbs Walking for exercises and pool exercises. Core abdomenal and extension exercises for the lumbar spine No more falling, do the coordination and balance taught in therapy. Avoid taking narcotics and muscle relaxers. Call if there is a worsening of your condition.

## 2016-08-03 NOTE — Progress Notes (Addendum)
Office Visit Note   Patient: Becky Hobbs           Date of Birth: October 19, 1945           MRN: [id number] Visit Date: 08/03/2016              Requested by: Marton Redwood, MD [address] [place], [place] [address] PCP: Marton Redwood, MD   Assessment & Plan: Visit Diagnoses:  1. History of lumbar fusion     Plan: Avoid bending and stooping . Avoid lifting weight greater than 10-15 lbs Walking for exercises and pool exercises. Core abdomenal and extension exercises for the lumbar spine No more falling, do the coordination and balance taught in therapy. Avoid taking narcotics and muscle relaxers. Call if there is a worsening of your condition.  Follow-Up Instructions: Return if symptoms worsen or fail to improve.   Orders:  No orders of the defined types were placed in this encounter.  Meds ordered this encounter  Medications  . DISCONTD: SYNTHROID 88 MCG tablet    Sig: Take 88 mcg by mouth.  . FOLIC [id number] PO    Sig: Take by mouth daily.      Procedures: No notes on file   Clinical Data: No additional findings.   Subjective: Chief Complaint  Patient presents with  . Lower Back - Follow-up    Low back is doing some better. " A little achy because the weather"  States still getting some cramps off/on in both legs. Pain rad at times into left thigh/groin area. Denies any weakness in legs. Some numbness in left leg at times but not constant.    HPI  70 year old female seen for complaints of lumbar pain and problems with headaches and balance difficulty after a fall that occurred in 02/2013. She is involved in litigation and has a mediation meeting scheduled at her attorney's office, Mr. Manual Meier. 09/16/2016. I reviewed the notes surrounding the time of her fall and visits with Phillips Hay PA-C and with me  In June 2013.    Review of Systems  Eyes: Negative.   Respiratory: Negative.   Cardiovascular: Negative.   Gastrointestinal: Negative.     Endocrine: Negative.   Genitourinary: Negative.   Musculoskeletal: Positive for gait problem. Negative for arthralgias, back pain, joint swelling, myalgias, neck pain and neck stiffness.  Skin: Negative.   Allergic/Immunologic: Negative for environmental allergies, food allergies and immunocompromised state.  Neurological: Positive for syncope, weakness, numbness and headaches. Negative for dizziness, tremors, seizures, facial asymmetry, speech difficulty and light-headedness.  Hematological: Negative for adenopathy. Bruises/bleeds easily.  Psychiatric/Behavioral: Positive for agitation, confusion, decreased concentration and sleep disturbance. Negative for behavioral problems, dysphoric mood, hallucinations, self-injury and suicidal ideas. The patient is nervous/anxious. The patient is not hyperactive.      Objective: Vital Signs: BP (!) 153/78   Pulse 77   Physical Exam  Constitutional: She appears well-developed and well-nourished. No distress.  HENT:  Head: Normocephalic.  Nose: Nose normal.  Eyes: EOM are normal.  Neck: Normal range of motion. No JVD present. No tracheal deviation present. No thyromegaly present.  Pulmonary/Chest: Effort normal and breath sounds normal. No stridor. No respiratory distress. She has no wheezes. She has no rales.  Abdominal: She exhibits no distension. There is no tenderness.  Lymphadenopathy:    She has no cervical adenopathy.  Neurological: She displays normal reflexes. No cranial nerve deficit. She exhibits normal muscle tone. Coordination normal.  Skin: Skin is warm. She is not  diaphoretic.  Psychiatric: She has a normal mood and affect. Her behavior is normal. Judgment and thought content normal.    Back Exam   Tenderness  The patient is experiencing no tenderness.   Range of Motion  Extension:  10 abnormal  Flexion:  60 abnormal  Lateral Bend Right:  20 abnormal  Lateral Bend Left: 20   Muscle Strength  The patient has normal back  strength.  Tests  Straight leg raise right: negative Straight leg raise left: negative  Reflexes  Patellar: 0/4 Achilles: 0/4 Babinski's sign: normal   Other  Toe Walk: abnormal Heel Walk: abnormal Sensation: normal Gait: abnormal   Comments:  Off balance with heel and toe walking.  Gait is wide based.       Specialty Comments:  No specialty comments available.  Imaging: No results found.   PMFS History:  Patient Active Problem List   Diagnosis Date Noted  . History of lumbar fusion 08/03/2016    Priority: High    Class: Chronic  . Migraine 04/09/2016  . Acute-on-chronic kidney injury ([place]) 09/07/2015  . Hypertension 09/07/2015  . Hypothyroidism 09/07/2015  . Gout 09/07/2015  . Low back pain 09/07/2015  . Obesity (BMI 30-39.9) 07/06/2013  . Headache 04/12/2013  . Concussion 04/12/2013  . Lumbar spinal stenosis 03/04/2012    Class: Diagnosis of   Past Medical History:  Diagnosis Date  . Anxiety    panic attacks  . Arthritis   . Asthma   . Blood transfusion    1973--with twins birth  . Diabetes mellitus    borderline  . H/O hiatal hernia    surgery fixed  . Heart murmur    MVP  . Hypertension   . Hypothyroidism     Family History  Problem Relation Age of Onset  . Kidney disease Mother   . Vascular Disease Mother     Past Surgical History:  Procedure Laterality Date  . ABDOMINAL HYSTERECTOMY    . APPENDECTOMY    . BACK SURGERY    . bladder tack    . BREAST CYST INCISION AND DRAINAGE    . BREAST SURGERY    . BUNIONECTOMY    . CHOLECYSTECTOMY    . FRACTURE SURGERY     left wrist  . LUMBAR LAMINECTOMY  03/04/2012   Procedure: MICRODISCECTOMY LUMBAR LAMINECTOMY;  Surgeon: Jessy Oto, MD;  Location: [facility];  Service: Orthopedics;  Laterality: N/A;  L3-4 central laminectomy  . NASAL SEPTUM SURGERY    . nissan fundoplication    . OVARIAN CYST SURGERY    . TONSILLECTOMY     Social History   Occupational History  . retired     Retired     Social History Main Topics  . Smoking status: Never Smoker  . Smokeless tobacco: Never Used  . Alcohol use No  . Drug use: No  . Sexual activity: No

## 2016-08-05 DIAGNOSIS — N183 Chronic kidney disease, stage 3 (moderate): Secondary | ICD-10-CM | POA: Diagnosis not present

## 2016-08-05 DIAGNOSIS — B1920 Unspecified viral hepatitis C without hepatic coma: Secondary | ICD-10-CM | POA: Diagnosis not present

## 2016-08-05 DIAGNOSIS — I10 Essential (primary) hypertension: Secondary | ICD-10-CM | POA: Diagnosis not present

## 2016-08-05 DIAGNOSIS — R7989 Other specified abnormal findings of blood chemistry: Secondary | ICD-10-CM | POA: Diagnosis not present

## 2016-08-05 DIAGNOSIS — F0781 Postconcussional syndrome: Secondary | ICD-10-CM | POA: Diagnosis not present

## 2016-08-05 DIAGNOSIS — E669 Obesity, unspecified: Secondary | ICD-10-CM | POA: Diagnosis not present

## 2016-08-05 DIAGNOSIS — E1129 Type 2 diabetes mellitus with other diabetic kidney complication: Secondary | ICD-10-CM | POA: Diagnosis not present

## 2016-08-05 DIAGNOSIS — N179 Acute kidney failure, unspecified: Secondary | ICD-10-CM | POA: Diagnosis not present

## 2016-08-05 DIAGNOSIS — E611 Iron deficiency: Secondary | ICD-10-CM | POA: Diagnosis not present

## 2016-08-10 ENCOUNTER — Telehealth: Payer: Self-pay | Admitting: *Deleted

## 2016-08-10 NOTE — Telephone Encounter (Signed)
Labs received from [place] (collected on 08/05/16):  RENAL FUNCTION PANEL: Glucose - 111 BUN - 33 Creatinine - 1.66 eGFR, If NonAfricn Am - 31 eFGFr, If Africn AM - 36 Calcium - 10.6 (All other values wnl)

## 2016-08-17 ENCOUNTER — Encounter (INDEPENDENT_AMBULATORY_CARE_PROVIDER_SITE_OTHER): Payer: Self-pay | Admitting: Specialist

## 2016-08-17 ENCOUNTER — Ambulatory Visit (INDEPENDENT_AMBULATORY_CARE_PROVIDER_SITE_OTHER): Payer: Medicare Other | Admitting: Specialist

## 2016-08-17 VITALS — BP 157/74 | HR 81 | Ht 64.0 in | Wt 200.0 lb

## 2016-08-17 DIAGNOSIS — G44329 Chronic post-traumatic headache, not intractable: Secondary | ICD-10-CM

## 2016-08-17 DIAGNOSIS — G8929 Other chronic pain: Secondary | ICD-10-CM | POA: Diagnosis not present

## 2016-08-17 DIAGNOSIS — M5442 Lumbago with sciatica, left side: Secondary | ICD-10-CM

## 2016-08-17 DIAGNOSIS — M4326 Fusion of spine, lumbar region: Secondary | ICD-10-CM | POA: Diagnosis not present

## 2016-08-17 DIAGNOSIS — G894 Chronic pain syndrome: Secondary | ICD-10-CM | POA: Diagnosis not present

## 2016-08-17 DIAGNOSIS — M7062 Trochanteric bursitis, left hip: Secondary | ICD-10-CM | POA: Diagnosis not present

## 2016-08-17 MED ORDER — GABAPENTIN 300 MG PO CAPS: ORAL_CAPSULE | ORAL | 6 refills | Status: DC

## 2016-08-17 NOTE — Patient Instructions (Signed)
Avoid frequent bending and stooping  No lifting greater than 10 lbs. May use ice or moist heat for pain. Weight loss is of benefit. Handicap license is approved.   

## 2016-08-17 NOTE — Progress Notes (Addendum)
Office Visit Note   Patient: Becky Hobbs           Date of Birth: August 05, 1946           MRN: [id number] Visit Date: 08/17/2016              Requested by: Marton Redwood, MD [address] [place], [place] [address] PCP: Marton Redwood, MD   Assessment & Plan: Visit Diagnoses:  1. Chronic right-sided low back pain with left-sided sciatica   2. Chronic post-traumatic headache, not intractable   This patient has chronic pain associated with her lumbar spine now has a 4 level lumbar fusion in which amounts to a greater than 50% impairment of her spinal general. She reports that she is been retired for some time. If she were not retired she would be told that she is incapable of performing full-time gainful employment. She is taking medicines a gabapentin touch and help decrease discomfort associated with headaches back and left leg pain. See no clinical signs of change at this point and believe that she is quite stable. We'll see her back in follow-up in 3 months certainly sooner if needed.  Plan:Avoid frequent bending and stooping  No lifting greater than 10 lbs. May use ice or moist heat for pain. Weight loss is of benefit. Handicap license is approved.    Follow-Up Instructions: Return in about 3 months (around 11/17/2016) for Follow up of chronic back pain..   Orders:  No orders of the defined types were placed in this encounter.  No orders of the defined types were placed in this encounter.     Procedures: No procedures performed   Clinical Data: No additional findings.   Subjective: Chief Complaint  Patient presents with  . Lower Back - Follow-up, Pain  . Right Leg - Follow-up, Pain  . Left Leg - Follow-up, Pain    Patient returns today as 6 week follow up for her low back and bilateral legs. Still complaining with leg cramps. Taking tizanidine which helps with the leg cramps. Low back has been bothering her more but feels that the weather has some effect on that.  Also states a little off balance, so ambulating with cane. Taking gabapentin, wants to know if she should elevate doses. Sees Dr. Lavell Anchors, neurology for follow up of her headaches and post fall, post concussion symptoms. Had bout of renal failure last year sees Dr. Caleb Popp for renal eval and treatment. Sees Dr. Patrice Paradise once in a while for ongoing left hip pain and bursitis with recent injection reportedly by Jeneen Rinks, Dr. Towanda Malkin PA. Dr. Basil Dess performed ESIs in the past prior to surgery.  Has arbitration scheduled for December 2017. She reports that her sister and her remember prior to her fall related to a wet floor while a water service was servicing a water dispenser at her home that she had been allowed to go to begin exercises she had not done previous to that. It is apparent that she was seen about 6 months prior to her fall and evaluated. She had undergone in the interval 8 months prior to her fall CT myelogram August 2013 which showed changes of both the L2- 3 and L3-4 levels. Both these segments showed encroachment on neural structures. Neither case she continues with problems with postconcussion symptoms headaches complaints of numbness in her legs and discomfort which does not appear to be changing. He has undergone recent evaluation of GI tract to assess for condition of incontinence and was found  to have weakness of her pelvic floor muscles. She reports having been started on an exercise program and progress field physical therapy.    Review of Systems  HENT: Negative.   Eyes: Negative.   Respiratory: Negative.   Cardiovascular: Negative.   Gastrointestinal: Negative.   Genitourinary: Positive for difficulty urinating. Negative for dyspareunia, dysuria, enuresis, flank pain and frequency.  Musculoskeletal: Positive for back pain.  Skin: Negative.   Neurological: Positive for weakness, numbness and headaches. Negative for dizziness, tremors, seizures, syncope, facial asymmetry, speech  difficulty and light-headedness.  Hematological: Negative.   Psychiatric/Behavioral: Negative.      Objective: Vital Signs: BP (!) 157/74   Pulse 81   Ht 5\' 4"  (1.626 m)   Wt 200 lb (90.7 kg)   BMI 34.33 kg/m   Physical Exam  Constitutional: She is oriented to person, place, and time. She appears well-developed and well-nourished.  Eyes: EOM are normal. Pupils are equal, round, and reactive to light.  Neck: Normal range of motion. Neck supple.  Pulmonary/Chest: Effort normal and breath sounds normal.  Abdominal: Soft. Bowel sounds are normal.  Musculoskeletal: She exhibits no edema, tenderness or deformity.  Neurological: She is alert and oriented to person, place, and time. She displays normal reflexes. No sensory deficit. She exhibits normal muscle tone. Coordination normal.  Skin: Skin is warm and dry.  Psychiatric: She has a normal mood and affect. Her behavior is normal. Judgment and thought content normal.    Back Exam   Tenderness  The patient is experiencing tenderness in the lumbar.  Range of Motion  Extension: normal  Flexion: abnormal  Lateral Bend Right: abnormal  Lateral Bend Left: abnormal  Rotation Right: abnormal  Rotation Left: abnormal   Muscle Strength  Right Quadriceps:  5/5  Left Quadriceps:  5/5  Right Hamstrings:  5/5  Left Hamstrings:  5/5   Tests  Straight leg raise right: negative Straight leg raise left: negative  Reflexes  Patellar: 1/4 Achilles: 1/4 Babinski's sign: normal   Other  Toe Walk: abnormal Heel Walk: abnormal Sensation: normal Gait: antalgic       Specialty Comments:  No specialty comments available.  Imaging: No results found.   PMFS History: Patient Active Problem List   Diagnosis Date Noted  . History of lumbar fusion 08/03/2016    Priority: High    Class: Chronic  . Migraine 04/09/2016  . Acute-on-chronic kidney injury ([place]) 09/07/2015  . Hypertension 09/07/2015  . Hypothyroidism 09/07/2015  .  Gout 09/07/2015  . Low back pain 09/07/2015  . Obesity (BMI 30-39.9) 07/06/2013  . Headache 04/12/2013  . Concussion 04/12/2013  . Lumbar spinal stenosis 03/04/2012    Class: Diagnosis of   Past Medical History:  Diagnosis Date  . Anxiety    panic attacks  . Arthritis   . Asthma   . Blood transfusion    1973--with twins birth  . Diabetes mellitus    borderline  . H/O hiatal hernia    surgery fixed  . Heart murmur    MVP  . Hypertension   . Hypothyroidism     Family History  Problem Relation Age of Onset  . Kidney disease Mother   . Vascular Disease Mother     Past Surgical History:  Procedure Laterality Date  . ABDOMINAL HYSTERECTOMY    . APPENDECTOMY    . BACK SURGERY    . bladder tack    . BREAST CYST INCISION AND DRAINAGE    . BREAST SURGERY    .  BUNIONECTOMY    . CHOLECYSTECTOMY    . FRACTURE SURGERY     left wrist  . LUMBAR LAMINECTOMY  03/04/2012   Procedure: MICRODISCECTOMY LUMBAR LAMINECTOMY;  Surgeon: Jessy Oto, MD;  Location: [facility];  Service: Orthopedics;  Laterality: N/A;  L3-4 central laminectomy  . NASAL SEPTUM SURGERY    . nissan fundoplication    . OVARIAN CYST SURGERY    . TONSILLECTOMY     Social History   Occupational History  . retired     Retired   Social History Main Topics  . Smoking status: Never Smoker  . Smokeless tobacco: Never Used  . Alcohol use No  . Drug use: No  . Sexual activity: No

## 2016-08-19 ENCOUNTER — Ambulatory Visit: Payer: Medicare Other | Attending: Gastroenterology | Admitting: Physical Therapy

## 2016-08-19 ENCOUNTER — Encounter: Payer: Self-pay | Admitting: Physical Therapy

## 2016-08-19 DIAGNOSIS — R279 Unspecified lack of coordination: Secondary | ICD-10-CM | POA: Insufficient documentation

## 2016-08-19 DIAGNOSIS — M6281 Muscle weakness (generalized): Secondary | ICD-10-CM | POA: Insufficient documentation

## 2016-08-19 NOTE — Therapy (Signed)
[facility] [address], [place], [place], [address] Phone: [phone]   Fax:  [phone]  Physical Therapy Evaluation  Patient Details  Name: Becky Hobbs MRN: [id number] Date of Birth: 03/16/1946 Referring Provider: Dr. Marcelyn Ditty  Encounter Date: 08/19/2016      PT End of Session - 08/19/16 1134    Visit Number 1   Number of Visits 10   Date for PT Re-Evaluation 10/14/16   Authorization Type medicare g-code 10th visit   PT Start Time 1120   PT Stop Time 1207   PT Time Calculation (min) 47 min   Activity Tolerance Treatment limited secondary to medical complications (Comment)   Behavior During Therapy [facility] for tasks assessed/performed      Past Medical History:  Diagnosis Date  . Anxiety    panic attacks  . Arthritis   . Asthma   . Blood transfusion    1973--with twins birth  . Diabetes mellitus    borderline  . H/O hiatal hernia    surgery fixed  . Heart murmur    MVP  . Hypertension   . Hypothyroidism     Past Surgical History:  Procedure Laterality Date  . ABDOMINAL HYSTERECTOMY    . APPENDECTOMY    . BACK SURGERY    . bladder tack    . BREAST CYST INCISION AND DRAINAGE    . BREAST SURGERY    . BUNIONECTOMY    . CHOLECYSTECTOMY    . FRACTURE SURGERY     left wrist  . LUMBAR LAMINECTOMY  03/04/2012   Procedure: MICRODISCECTOMY LUMBAR LAMINECTOMY;  Surgeon: Jessy Oto, MD;  Location: [facility];  Service: Orthopedics;  Laterality: N/A;  L3-4 central laminectomy  . NASAL SEPTUM SURGERY    . nissan fundoplication    . OVARIAN CYST SURGERY    . TONSILLECTOMY      There were no vitals filed for this visit.       Subjective Assessment - 08/19/16 1126    Subjective Patient reports everytime she has surgery especially for the back would have to stand up to urinate.  Patient reports she has to wear Pose pads due to not having control of bowels. Patient does not have the feel ing  going to the bathroom. Patient  feels she may need another bladder tack surgery.  Test reports rectal muscles were weak internally and externally. Patient had her Gallbladder removed 6 years ago and that is when she had trouble with her bowels.     Patient Stated Goals control muscles in rectum to reduce fecal incontinence   Currently in Pain? No/denies   Multiple Pain Sites No            [facility] PT Assessment - 08/19/16 0001      Assessment   Medical Diagnosis R15.9 Full incontinence of feces, M62.89 Ptjer specified disorders of muscle   Referring Provider Dr. Marcelyn Ditty   Onset Date/Surgical Date 02/10/16   Prior Therapy none     Precautions   Precautions None     Restrictions   Weight Bearing Restrictions No     Balance Screen   Has the patient fallen in the past 6 months No   Has the patient had a decrease in activity level because of a fear of falling?  No   Is the patient reluctant to leave their home because of a fear of falling?  No     Home Environment   Living  Environment Private residence     Prior Function   Level of Independence Independent   Vocation Retired     Associate Professor   Overall Cognitive Status Within Functional Limits for tasks assessed     Observation/Other Assessments   Focus on Therapeutic Outcomes (FOTO)  FOTO score is 50% limitation for bowel leakage  goal is 34% limitation for bowel leakage     Posture/Postural Control   Posture/Postural Control No significant limitations     ROM / Strength   AROM / PROM / Strength AROM;Strength     Strength   Overall Strength Comments contract abdomen lower but bulge out upper   Right Hip Flexion 4/5   Left Hip Flexion 4/5   Left Hip ABduction 4/5     Palpation   Palpation comment decreased mobility of abdominal scar; decreased mobility of abdominal tissue;      Standardized Balance Assessment   Five times sit to stand comments  19 sec                 Pelvic Floor Special Questions  - 08/19/16 0001    Prior Pregnancies Yes   Number of Pregnancies 1  twins   Number of Vaginal Deliveries 1   Currently Sexually Active No   Urinary Leakage Yes   Pad use 5-6 poise pads per day  at night use #6 pad with chuck for security   Activities that cause leaking Coughing;With strong urge;Sneezing;Laughing;Lifting;Bending;Walking  stool-bending over, picking up items, up/down stairs   Urinary urgency Yes   Urinary frequency 6  3-4 times has bowel movement   Fecal incontinence Yes   Fluid intake 4 water bottles   Caffeine beverages drinks decaff   Exam Type Deferred                  PT Education - 08/19/16 1206    Education provided Yes   Education Details filling out bowel and bladder diary; bowel health; bladder irritants; how to put fiber into her diet   Person(s) Educated Patient   Methods Explanation;Demonstration;Handout   Comprehension Returned demonstration;Verbalized understanding          PT Short Term Goals - 08/19/16 1217      PT SHORT TERM GOAL #1   Title understand what bladder irritants are and how to incorportate fiber into her diet   Time 4   Period Weeks     PT SHORT TERM GOAL #2   Title ability to contract the pelvic floor with lower abdominal contraction to reduce urinary and fecal leakage   Time 4   Period Weeks   Status New     PT SHORT TERM GOAL #3   Title able to go to the bathroom every 2 hours to do timed voiding to reduce leakage   Time 4   Period Weeks   Status New     PT SHORT TERM GOAL #4   Title review bowel and bladder diary to understand what foods and activities increase leakage   Time 4   Period Weeks   Status New           PT Long Term Goals - 08/19/16 1219      PT LONG TERM GOAL #1   Title independent with HEP and how to progress herself   Time 8   Period Weeks   Status New     PT LONG TERM GOAL #2   Title fecal leakage decreased >/= 50% due to increased pelvic floor  strength and reduction of  Poise pads to </= 2 per day   Time 8   Period Weeks   Status New     PT LONG TERM GOAL #3   Title urinary leakage decreased >/= 50% due to ability to contract the pelvic floor and not bear down   Time 8   Period Weeks   Status New     PT LONG TERM GOAL #4   Title FOTO score of bowel leakage </= 34%    Time 8   Period Weeks   Status New               Plan - 08/19/16 1208    Clinical Impression Statement Patient is a 70 year old female with diagnosis of fecal incontinence.  Patient reports urinary incontinence. Patient reports no pain.  Patient deferred assessment of pelvic floor strength manually and with pelvic EMG for the evaluation. Patient has abdominal and hip weakness.  Patient has decreased mobility of abdominal scar and tightness in the abdominal tissue. Patient wears 5 Poise pads during the day and 1 during the night.  Patient does not feel if she is having fecal or urinary incontinence. Patient will leak urine and fecal with the urge, bending over, lifting, coughing, laughing, and activity.  Patient is incorporating fiber into her diet.  Patient reports she will have more fecal leakage with eating fruits and vegetables.  Patient is moderately complex evaluation due to an evolving condition with increased leakage and comorbidities such as abdominal  hysterectomy, diabetes, hypothyroidism, s/p bladder tack, and 5 lumbar surgeries.  Patient will benefit from skilled therapy to improve pelvic floor strength to reduce urinary and fecal leakage.    Rehab Potential Good   Clinical Impairments Affecting Rehab Potential none   PT Frequency 1x / week   PT Duration 8 weeks   PT Treatment/Interventions Biofeedback;Neuromuscular re-education;Therapeutic exercise;Therapeutic activities;Manual techniques;Scar mobilization   PT Next Visit Plan pelvic floor EMG; abdominal bracing; review bowel and bladder diary   PT Home Exercise Plan pelvic floor exercise   Recommended Other Services None    Consulted and Agree with Plan of Care Patient      Patient will benefit from skilled therapeutic intervention in order to improve the following deficits and impairments:  Pain, Decreased strength, Decreased endurance  Visit Diagnosis: Muscle weakness (generalized) - Plan: PT plan of care cert/re-cert  Unspecified lack of coordination - Plan: PT plan of care cert/re-cert      G-Codes - 16/07/37 1118    Functional Assessment Tool Used FOTO score 50% limitation CK for bowel leakage  goal is 34% limitation CJ   Functional Limitation Other PT primary   Other PT Primary Current Status (T0626) At least 40 percent but less than 60 percent impaired, limited or restricted   Other PT Primary Goal Status (R4854) At least 20 percent but less than 40 percent impaired, limited or restricted       Problem List Patient Active Problem List   Diagnosis Date Noted  . History of lumbar fusion 08/03/2016    Class: Chronic  . Migraine 04/09/2016  . Acute-on-chronic kidney injury ([place]) 09/07/2015  . Hypertension 09/07/2015  . Hypothyroidism 09/07/2015  . Gout 09/07/2015  . Low back pain 09/07/2015  . Obesity (BMI 30-39.9) 07/06/2013  . Headache 04/12/2013  . Concussion 04/12/2013  . Lumbar spinal stenosis 03/04/2012    Class: Diagnosis of    Earlie Counts, PT 08/19/16 12:26 PM   [facility]  [place] [address] [place], [place], [address] Phone: [phone]   Fax:  [phone]  Name: Becky Hobbs MRN: [id number] Date of Birth: Jun 30, 1946

## 2016-08-19 NOTE — Patient Instructions (Signed)
Certain foods and liquids will decrease the pH making the urine more acidic.  Urinary urgency increases when the urine has a low pH.  Most common irritants: alcohol, carbonated beverages and caffinated beverages.  Foods to avoid: apple juice, apples, ascorbic acid, canteloupes, chili, citrus fruits, coffee, cranberries, grapes, guava, peaches, pepper, pineapple, plums, strawberries, tea, tomatoes, and vinegar.  Drinking plenty of water may help to increase the pH and dilute out any of the effects of specific irritants.  Foods that are NOT irritating to the bladder include: Pears, papayas, sun-brewed teas, watermelons, non-citrus herbal teas, apricots, kava and low-acid instant drinks (Postum)  Introduction to [place] and daily habits can help you predict when your bowels will move on a regular basis.  The consistency and quantity of the stool is usually more important than the frequency.  The goal is to have a regular bowel movement that is soft but formed.   Tips on Emptying Regularly . Eat breakfast.  Usually the best time of day for a bowel movement will be a half hour to an hour after eating.  These times are best because the body uses the gastrocolic reflex, a stimulation of bowel motion that occurs with eating, to help produce a bowel movement.  For some people even a simple hot drink in the morning can help the reflex action begin. . Eat all your meals at a predictable time each day.  The bowel functions best when food is introduced at the same regular intervals. . The amount of food eaten at a given time of day should be about the same size from day to day.  The bowel functions best when food is introduced in similar quantities from day to day. It is fine to have a small breakfast and a large lunch, or vice versa, just be consistent. . Eat two servings of fruit or vegetables and at least one serving of a complex carbohydrates (whole grains such as brown rice, bran, whole wheat  bread, or oatmeal) at each meal. . Drink plenty of water-ideally eight glasses a day.  Be sure to increase your water intake if you are increasing fiber into your diet.  Maintain Healthy Habits . Exercise daily.  You may exercise at any time of day, but you may find that bowel function is helped most if the exercise is at a consistent time each day. . Make sure that you are not rushed and have convenient access to a bathroom at your selected time to empty your bowels.      go to the bathroom every 2 hours for time voiding.   Types of Fiber  There are two main types of fiber:  insoluble and soluble.  Both of these types can prevent and relieve constipation and diarrhea, although some people find one or the other to be more easily digested.  This handout details information about both types of fiber.  Insoluble Fiber       Functions of Insoluble Fiber . moves bulk through the intestines  . controls and balances the pH (acidity) in the intestines       Benefits of Insoluble Fiber . promotes regular bowel movement and prevents constipation  . removes fecal waste through colon in less time  . keeps an optimal pH in intestines to prevent microbes from producing cancer substances, therefore preventing colon cancer        Food Sources of Insoluble Fiber . whole-wheat products  . wheat bran "miller's bran" . corn bran  .  flax seed or other seeds . vegetables such as green beans, broccoli, cauliflower and potato skins  . fruit skins and root vegetable skins  . popcorn . brown rice  About Fecal Incontinence Fecal incontinence is loss of control over bowel movements.  Diarrhea is short-term loss of bowel control and can happen to anyone as an isolated event. Some people experience constant loss of gas (flatus) without awareness, which is called anal (gas) incontinence. Loss of bowel control can also occur as a result of:  . childbirth or traumatic injury to the rectal area  . irritation  or infection of the rectum, anus, or the surrounding area  . spinal cord injury  . brain condition such as head injury, stroke, or coma  . chronic constipation (can cause the muscles of the rectum and intestines to stretch and weaken)  . Alzheimer's disease or other dementias Treatment Options depend on the cause Many people benefit from behavioral techniques and/or exercise. These include: . exercises for the sphincters and pelvic floor muscles . learning proper bowel health maintenance . proper toileting positioning . dietary factor management . bowel retraining / learning control techniques Surgery Surgery may be needed to repair the muscle at the opening of the rectum.  Another type of surgery is a colostomy. A colostomy attaches part of the colon to an opening in the wall of the abdomen. Bowel movements then pass through this opening instead of the rectum. They are collected in a bag outside the body.  Medication A person can usually control stool better when it is firm rather than loose or liquid. Sometimes taking medications to change the consistency of the stool can provide relief. Over-the-counter anti-diarrhea medications may include Imodium, and prescription medications may include Lomotil.  These medications should be discussed with your physician prior to use. Earlie Counts, PT  [facility] [address] [place], [place] [address] [phone]

## 2016-08-24 ENCOUNTER — Encounter: Payer: Medicare Other | Admitting: Physical Therapy

## 2016-08-26 DIAGNOSIS — D485 Neoplasm of uncertain behavior of skin: Secondary | ICD-10-CM | POA: Diagnosis not present

## 2016-09-09 ENCOUNTER — Encounter: Payer: Self-pay | Admitting: Neurology

## 2016-09-09 ENCOUNTER — Telehealth (INDEPENDENT_AMBULATORY_CARE_PROVIDER_SITE_OTHER): Payer: Self-pay | Admitting: Specialist

## 2016-09-09 ENCOUNTER — Ambulatory Visit (INDEPENDENT_AMBULATORY_CARE_PROVIDER_SITE_OTHER): Payer: Medicare Other | Admitting: Neurology

## 2016-09-09 VITALS — BP 160/72 | HR 80 | Ht 64.0 in | Wt 209.0 lb

## 2016-09-09 DIAGNOSIS — R51 Headache: Secondary | ICD-10-CM | POA: Diagnosis not present

## 2016-09-09 DIAGNOSIS — W19XXXA Unspecified fall, initial encounter: Secondary | ICD-10-CM

## 2016-09-09 DIAGNOSIS — G8929 Other chronic pain: Secondary | ICD-10-CM

## 2016-09-09 DIAGNOSIS — R2689 Other abnormalities of gait and mobility: Secondary | ICD-10-CM | POA: Diagnosis not present

## 2016-09-09 DIAGNOSIS — R519 Headache, unspecified: Secondary | ICD-10-CM

## 2016-09-09 MED ORDER — NORTRIPTYLINE HCL 10 MG PO CAPS: 10.0000 mg | ORAL_CAPSULE | Freq: Every day | ORAL | 11 refills | Status: DC

## 2016-09-09 NOTE — Patient Instructions (Signed)
Remember to drink plenty of fluid, eat healthy meals and do not skip any meals. Try to eat protein with a every meal and eat a healthy snack such as fruit or nuts in between meals. Try to keep a regular sleep-wake schedule and try to exercise daily, particularly in the form of walking, 20-30 minutes a day, if you can.   As far as your medications are concerned, I would like to suggest: Nortriptyline 10mg  at bedtime  As far as diagnostic testing: MRI brain  I would like to see you back in 6 months or if headaches are not improved, sooner if we need to. Please call us with any interim questions, concerns, problems, updates or refill requests.   Our phone number is [phone]. We also have an after hours call service for urgent matters and there is a physician on-call for urgent questions. For any emergencies you know to call 911 or go to the nearest emergency room  Nortriptyline capsules What is this medicine? NORTRIPTYLINE (nor TRIP ti leen) is used to treat depression. This medicine may be used for other purposes; ask your health care provider or pharmacist if you have questions. COMMON BRAND NAME(S): Aventyl, Pamelor What should I tell my health care provider before I take this medicine? They need to know if you have any of these conditions: -an alcohol problem -bipolar disorder or schizophrenia -difficulty passing urine, prostate trouble -glaucoma -heart disease or recent heart attack -liver disease -over active thyroid -seizures -thoughts or plans of suicide or a previous suicide attempt or family history of suicide attempt -an unusual or allergic reaction to nortriptyline, other medicines, foods, dyes, or preservatives -pregnant or trying to get pregnant -breast-feeding How should I use this medicine? Take this medicine by mouth with a glass of water. Follow the directions on the prescription label. Take your doses at regular intervals. Do not take it more often than directed. Do  not stop taking this medicine suddenly except upon the advice of your doctor. Stopping this medicine too quickly may cause serious side effects or your condition may worsen. A special MedGuide will be given to you by the pharmacist with each prescription and refill. Be sure to read this information carefully each time. Talk to your pediatrician regarding the use of this medicine in children. Special care may be needed. Overdosage: If you think you have taken too much of this medicine contact a poison control center or emergency room at once. NOTE: This medicine is only for you. Do not share this medicine with others. What if I miss a dose? If you miss a dose, take it as soon as you can. If it is almost time for your next dose, take only that dose. Do not take double or extra doses. What may interact with this medicine? Do not take this medicine with any of the following medications: -arsenic trioxide -certain medicines medicines for irregular heart beat -cisapride -halofantrine -linezolid -MAOIs like Carbex, Eldepryl, Marplan, Nardil, and Parnate -methylene blue (injected into a vein) -other medicines for mental depression -phenothiazines like perphenazine, thioridazine and chlorpromazine -pimozide -probucol -procarbazine -sparfloxacin -St. John's Wort -ziprasidone This medicine may also interact with any of the following medications: -atropine and related drugs like hyoscyamine, scopolamine, tolterodine and others -barbiturate medicines for inducing sleep or treating seizures, such as phenobarbital -cimetidine -medicines for diabetes -medicines for seizures like carbamazepine or phenytoin -reserpine -thyroid medicine This list may not describe all possible interactions. Give your health care provider a list of all the medicines,  herbs, non-prescription drugs, or dietary supplements you use. Also tell them if you smoke, drink alcohol, or use illegal drugs. Some items may interact with  your medicine. What should I watch for while using this medicine? Tell your doctor if your symptoms do not get better or if they get worse. Visit your doctor or health care professional for regular checks on your progress. Because it may take several weeks to see the full effects of this medicine, it is important to continue your treatment as prescribed by your doctor. Patients and their families should watch out for new or worsening thoughts of suicide or depression. Also watch out for sudden changes in feelings such as feeling anxious, agitated, panicky, irritable, hostile, aggressive, impulsive, severely restless, overly excited and hyperactive, or not being able to sleep. If this happens, especially at the beginning of treatment or after a change in dose, call your health care professional. Dennis Bast may get drowsy or dizzy. Do not drive, use machinery, or do anything that needs mental alertness until you know how this medicine affects you. Do not stand or sit up quickly, especially if you are an older patient. This reduces the risk of dizzy or fainting spells. Alcohol may interfere with the effect of this medicine. Avoid alcoholic drinks. Do not treat yourself for coughs, colds, or allergies without asking your doctor or health care professional for advice. Some ingredients can increase possible side effects. Your mouth may get dry. Chewing sugarless gum or sucking hard candy, and drinking plenty of water may help. Contact your doctor if the problem does not go away or is severe. This medicine may cause dry eyes and blurred vision. If you wear contact lenses you may feel some discomfort. Lubricating drops may help. See your eye doctor if the problem does not go away or is severe. This medicine can cause constipation. Try to have a bowel movement at least every 2 to 3 days. If you do not have a bowel movement for 3 days, call your doctor or health care professional. This medicine can make you more sensitive  to the sun. Keep out of the sun. If you cannot avoid being in the sun, wear protective clothing and use sunscreen. Do not use sun lamps or tanning beds/booths. What side effects may I notice from receiving this medicine? Side effects that you should report to your doctor or health care professional as soon as possible: -allergic reactions like skin rash, itching or hives, swelling of the face, lips, or tongue -anxious -breathing problems -changes in vision -confusion -elevated mood, decreased need for sleep, racing thoughts, impulsive behavior -eye pain -fast, irregular heartbeat -feeling faint or lightheaded, falls -feeling agitated, angry, or irritable -fever with increased sweating -hallucination, loss of contact with reality -seizures -stiff muscles -suicidal thoughts or other mood changes -tingling, pain, or numbness in the feet or hands -trouble passing urine or change in the amount of urine -trouble sleeping -unusually weak or tired -vomiting -yellowing of the eyes or skin Side effects that usually do not require medical attention (report to your doctor or health care professional if they continue or are bothersome): -change in sex drive or performance -change in appetite or weight -constipation -dizziness -dry mouth -nausea -tired -tremors -upset stomach This list may not describe all possible side effects. Call your doctor for medical advice about side effects. You may report side effects to FDA at 1-800-FDA-1088. Where should I keep my medicine? Keep out of the reach of children. Store at room temperature between  15 and 30 degrees C (59 and 86 degrees F). Keep container tightly closed. Throw away any unused medicine after the expiration date. NOTE: This sheet is a summary. It may not cover all possible information. If you have questions about this medicine, talk to your doctor, pharmacist, or health care provider.  2017 Elsevier/Gold Standard (2016-02-28  12:53:08)

## 2016-09-09 NOTE — Telephone Encounter (Signed)
Patient states she was cleared back in 2014 by Shelia/Nitka to be abel to go to the gym and she needs a copy of the letter or a new letter written that states in 2014 she was able to attend a gym.

## 2016-09-09 NOTE — Telephone Encounter (Signed)
Please advise on message below. Did not see letter in old system. Ok to return to gym.

## 2016-09-09 NOTE — Progress Notes (Signed)
[facility]    Provider:  Dr Jaynee Eagles Referring Provider: Marton Redwood, MD Primary Care Physician:  Marton Redwood, MD  CC: migraine  Interval history 09/09/2016: Amitriptyline helped her migraines. But it made her mouth dry.  Headaches improved on alprazolam. Since she stopped the Amitriptyline her migraines/headaches are worse. She denies dizziness. No lightheadedness. She has had vertigo in the past. She has a dull headache today. No snoring at night. No often morning headaches. Discussed nortriptyline as this can have less side effects than amitriptyline as a preventative and can help with her insomnia. When she takes the imitrex it helps acutely. She reports Imbalance, falls. She reports worsening balance and falls, falling to the right, right sided weakness will check MRI of the brain to evaluate for strokes or other causes.   Tried and failed: Topamax, Amitriptyline.  Interval history 04/09/2016: She still has migraines. She is having 2-3 migraines a week. They are on the right side, thumping, pounding. She takes an imitrex or an [place] and goes to bed. They are lasting hours. She is not taking the topiramate, she had kidney failure and stopped taking. Sh eis having a lot of insomnia. She takes gabapentin and she has to take [place]. She has a significant problem sleeping. Migraines with some nausea. She has significant insomnia, talked about this a little, good sleep habits. Discussed and insomnia counselor. We'll start amitriptyline at night for migraine prevention which can help with insomnia as well. Discussed side effects such as QT prolongation, somnolence, fatigue, risk of falls and others per patient instructions.  Tried and failed: Topamax, Amitriptyline.  Interval history: Migraines are stable. She is still having them once a week. She takes the triptan then they go away. For acute management, she takes just one triptan. Then she sleeps and it goes away. She  hasn't really been taking the Topamax, she said it made her feel like she had cognitive problems. Discussed that Topamax can do and that we could probably decrease the dose. Imitrex helps but it does not make the headache go away, she still has to sleep for a while. Discussed trying some samples of Treximet or Relpax which might work better but do not use them together. Use them both just like the Imitrex, at onset of headache may repeat in 2 hours do not use more than 2 times in 24 hours or 2 days a week. She also has Qysmia on her med list for weight loss, I did advise her that this drug does have Topamax and is well so to discuss that with prescribing physician so as not to unknowingly increased dosage of one medication.  Previous History Dr. Krista Blue:  Becky Hobbs is a 70 years old right-handed African American female, referred by her primary care physician Dr. Marton Redwood for evaluation of headaches, concussion, and gait difficulty, initial visit was in July 2014.   She had past medical history of hypertension, diabetes, hypothyroidism, in May 12th 2014, she came downstairs, stepped into a puddle of water, fell backwards, landed on her occipital area, there was no loss of consciousness, she could go on cleaning the floor, it was a leakage from 3 gallon water container, she has no difficulty completing the task, afterwards, she drove to her nephew's house, by then she was noticed to stumbled over, couple days later, she began to have frequent headaches, vertex bilateral retro-orbital area severe pounding headaches with associated light noise sensitivity, she also complains of constant gait difficulty, bilateral lower extremity tremorish sensation  when bearing weight,   She also has difficulty sleeping, her headache are pouding, movements make it worse, with associated light noise sensitivity, she denies visual loss, she complains of neck pain, radiating pain to her bilateral shoulder. She denies bowel and  bladder incontinence   CT head without contrast in June 2014 showed no acute lesions,  MRI of cervical spine in September 2013 evaluating for gait difficulty, bilateral upper extremity paresthesia showed multilevel degenerative disc disease, most severe C5-6, C6 and 7 with mild canal stenosis, mild to moderate left more than right foraminal stenosis.   04/21/13 scan of cervical spine showing prominent spondylitic changes most prominent at C5-6 and C6-7 where there is a broad-based disc osteophyte complexes resulting in mild canal and moderate left greater than right foraminal narrowing. Overall no significant changes compared with previous MRI scan dated 07/01/2012.    UPDATE Sept 29th 2015:  Her headache overall has much improved, instead of daily headaches, she is having headaches 2-3 times each week, starting at the right parietal area, pounding, with associated light noise sensitivity, nauseous, movements make it worse, she preferred to lie down in dark quiet room, lasting half days,  She did have a history of migraine headaches in the past  Trigger for her headaches are stress, sleep deprivation, insomnia, she continued having low back pain, has been receving epidural injections by Dr. Maia Petties  ROS: Patient complains of the following review of systems, endorses blurry vision, runny nose, insomnia, incontinence of bladder, back pain, headache, decreased concentration. All others negative.  Social History   Social History  . Marital status: Widowed    Spouse name: N/A  . Number of children: 3  . Years of education: college   Occupational History  . retired     Retired   Social History Main Topics  . Smoking status: Never Smoker  . Smokeless tobacco: Never Used  . Alcohol use No  . Drug use: No  . Sexual activity: No   Other Topics Concern  . Not on file   Social History Narrative   Patient has a high school education.    Patient is widow.   Patient is retired.     Family History  Problem Relation Age of Onset  . Kidney disease Mother   . Vascular Disease Mother     Past Medical History:  Diagnosis Date  . Anxiety    panic attacks  . Arthritis   . Asthma   . Blood transfusion    1973--with twins birth  . Diabetes mellitus    borderline  . H/O hiatal hernia    surgery fixed  . Heart murmur    MVP  . Hypertension   . Hypothyroidism     Past Surgical History:  Procedure Laterality Date  . ABDOMINAL HYSTERECTOMY    . APPENDECTOMY    . BACK SURGERY    . bladder tack    . BREAST CYST INCISION AND DRAINAGE    . BREAST SURGERY    . BUNIONECTOMY    . CHOLECYSTECTOMY    . FRACTURE SURGERY     left wrist  . LUMBAR LAMINECTOMY  03/04/2012   Procedure: MICRODISCECTOMY LUMBAR LAMINECTOMY;  Surgeon: Jessy Oto, MD;  Location: [facility];  Service: Orthopedics;  Laterality: N/A;  L3-4 central laminectomy  . NASAL SEPTUM SURGERY    . nissan fundoplication    . OVARIAN CYST SURGERY    . TONSILLECTOMY      Current Outpatient Prescriptions  Medication Sig  Dispense Refill  . albuterol (PROVENTIL HFA;VENTOLIN HFA) 108 (90 BASE) MCG/ACT inhaler Inhale 2 puffs into the lungs every 6 (six) hours as needed for wheezing or shortness of breath. 1 Inhaler 6  . allopurinol (ZYLOPRIM) 100 MG tablet Take 100 mg by mouth daily.    Marland Kitchen ALPRAZolam (XANAX) 0.5 MG tablet Take 0.5 mg by mouth daily as needed for anxiety.     Marland Kitchen aspirin EC 81 MG tablet Take 81 mg by mouth daily.    Marland Kitchen bismuth subsalicylate (PEPTO BISMOL) 262 MG/15ML suspension Take 30 mLs by mouth every 6 (six) hours as needed for indigestion or diarrhea or loose stools.    . cetirizine (ZYRTEC) 10 MG tablet Take 10 mg by mouth daily.    . Cholecalciferol 2000 UNITS CAPS Take 4,000 Units by mouth daily.    Marland Kitchen EPINEPHrine (EPIPEN 2-PAK) 0.3 mg/0.3 mL IJ SOAJ injection Inject 0.3 mLs (0.3 mg total) into the skin once. (Patient taking differently: Inject 0.3 mg into the skin daily as needed (allergic  reaction). ) 2 Device 4  . fluticasone (FLONASE) 50 MCG/ACT nasal spray INT 2 SPRAYS INTO EACH NOSTRIL QHS  5  . FOLIC [id number]-[id number] PO Take by mouth daily.    Marland Kitchen gabapentin (NEURONTIN) 300 MG capsule Take 2 tablets (2-300 mg tablets) qhs and one 300mg  tablet every AM 90 capsule 6  . levothyroxine (SYNTHROID, LEVOTHROID) 88 MCG tablet Take 88 mcg by mouth daily before breakfast.     . losartan (COZAAR) 100 MG tablet Take 100 mg by mouth daily.    . metoprolol succinate (TOPROL-XL) 100 MG 24 hr tablet Take 100 mg by mouth daily. Take with or immediately following a meal.    . Multiple Vitamin (MULITIVITAMIN WITH MINERALS) TABS Take 1 tablet by mouth daily.    Marland Kitchen olopatadine (PATANOL) 0.1 % ophthalmic solution Place 1 drop into both eyes 2 (two) times daily as needed for allergies.     Marland Kitchen ondansetron (ZOFRAN) 4 MG tablet Take 1 tablet (4 mg total) by mouth every 8 (eight) hours as needed for nausea. 30 tablet 6  . ondansetron (ZOFRAN-ODT) 4 MG disintegrating tablet Take 1 tablet (4 mg total) by mouth every 8 (eight) hours as needed for nausea. 60 tablet 12  . oxyCODONE-acetaminophen (PERCOCET/ROXICET) 5-325 MG per tablet Take 1 tablet by mouth every 6 (six) hours as needed for moderate pain.   0  . potassium chloride SA (K-DUR,KLOR-CON) 20 MEQ tablet Take 20 mEq by mouth daily as needed (fluid). Takes with torsemide.    . Probiotic Product (PROBIOTIC PO) Take 1 capsule by mouth daily.    . silver sulfADIAZINE (SILVADENE) 1 % cream Apply topically 2 (two) times daily. 50 g 0  . SUMAtriptan (IMITREX) 100 MG tablet Take 1 tablet (100 mg total) by mouth once as needed for migraine. May repeat in 2 hours if headache persists or recurs. 12 tablet 12  . tiZANidine (ZANAFLEX) 2 MG tablet TK 1 TO 2 T PO Q 6 H PRF SPASM  0  . torsemide (DEMADEX) 20 MG tablet Take 1 tablet (20 mg total) by mouth daily as needed (fluid). For edema. 30 tablet 0  . zolpidem (AMBIEN) 10 MG tablet Take 10 mg by mouth at bedtime as  needed for sleep.     Marland Kitchen amitriptyline (ELAVIL) 25 MG tablet Take 2 tablets (50 mg total) by mouth at bedtime. Start with one pill at night. In 2 weeks if needed can increase to 2 pills at night. (Patient  not taking: Reported on 09/09/2016) 60 tablet 11   No current facility-administered medications for this visit.     Allergies as of 09/09/2016 - Review Complete 09/09/2016  Allergen Reaction Noted  . Morphine and related Itching and Other (See Comments) 03/04/2012  . Other Anaphylaxis 07/10/2014  . Erythromycin Hives and Itching 05/12/2012  . Hydrocodone Itching 03/01/2012  . Penicillins Hives 03/01/2012  . Tramadol hcl Itching 01/04/2014  . Hydrocodone-acetaminophen Itching 07/10/2014    Vitals: BP (!) 160/72 (BP Location: Left Arm, Patient Position: Sitting, Cuff Size: Normal)   Pulse 80   Ht 5\' 4"  (1.626 m)   Wt 209 lb (94.8 kg)   BMI 35.87 kg/m  Last Weight:  Wt Readings from Last 1 Encounters:  09/09/16 209 lb (94.8 kg)   Last Height:   Ht Readings from Last 1 Encounters:  09/09/16 5\' 4"  (1.626 m)     Exam: Gen: NAD, conversant, well nourised, obese, well groomed  CV: RRR, no MRG. No Carotid Bruits. No peripheral edema, warm, nontender Eyes: Conjunctivae clear without exudates or hemorrhage  Neuro: Detailed Neurologic Exam  Speech:  Speech is normal; fluent and spontaneous with normal comprehension.  Cognition:  The patient is oriented to person, place, and time;  Cranial Nerves:  The pupils are equal, round, and reactive to light. Visual fields are full to finger confrontation. Extraocular movements are intact. Trigeminal sensation is intact and the muscles of mastication are normal. The face is symmetric. The palate elevates in the midline. Hearing intact. Voice is normal. Shoulder shrug is normal. The tongue has normal motion without fasciculations.   Coordination:  Normal finger to nose and heel to shin.   Gait:   imbalance with heel to toe.   Motor Observation:  No asymmetry, no atrophy, and no involuntary movements noted. Tone:  Normal muscle tone.   Posture:  Posture is normal. normal erect   Strength:  Strength is V/V in the upper and lower limbs.    Sensation: intact , Romberg negative   Reflex Exam:  DTR's:  Deep tendon reflexes in the upper and lower extremities are brisk but symmetric bilaterally.  Toes:  The toes are downgoing bilaterally.  Clonus:  Clonus is absent.   Assessment/Plan: 70 year old female here for follow up of migraines. She has worsening balance ongoing for years but now worsening. Feels she has right-sided weakness, right side gives out.   As far as your medications are concerned, I would like to suggest: Migraines: Nortriptyline every night before bed. Amitriptyline helped but had too many side effects. May help with insomnia as well as migraine. Imitrex at the onset of migraine. Can repeat in 2 hours, no more than 2x in a day Zofran for nausea Discussed side effects as documented in the patient instruction formed For falls and imbalance and right-sided deficits: MRI of the brain to evaluate for strokes. Fall risk, continue PT (She has had it multiple times, advised using a walking aid). May want to examine polypharmacy with pcp Follow-up 6 months   Sarina Ill, MD  [facility] [address] [place], [place] [address]  Phone [phone] Fax [phone]  A total of 30  minutes was spent face-to-face with this patient. Over half this time was spent on counseling patient on the migraine and imbalance diagnosis and different diagnostic and therapeutic options available.

## 2016-09-10 ENCOUNTER — Encounter: Payer: Self-pay | Admitting: Physical Therapy

## 2016-09-10 ENCOUNTER — Other Ambulatory Visit: Payer: Self-pay | Admitting: *Deleted

## 2016-09-10 ENCOUNTER — Ambulatory Visit: Payer: Medicare Other | Admitting: Physical Therapy

## 2016-09-10 DIAGNOSIS — R279 Unspecified lack of coordination: Secondary | ICD-10-CM | POA: Diagnosis not present

## 2016-09-10 DIAGNOSIS — M6281 Muscle weakness (generalized): Secondary | ICD-10-CM | POA: Diagnosis not present

## 2016-09-10 MED ORDER — ALPRAZOLAM 0.5 MG PO TABS: ORAL_TABLET | ORAL | 0 refills | Status: DC

## 2016-09-10 NOTE — Progress Notes (Signed)
Faxed printed rx xanax for MRI to pt pharmacy. Qty 3 no refills. Fax: [phone]. Received confirmation.

## 2016-09-10 NOTE — Therapy (Addendum)
[facility] [address] [place], [place], [address] Phone: [phone]   Fax:  [phone]  Physical Therapy Treatment  Patient Details  Name: Becky Hobbs MRN: [id number] Date of Birth: [birth year] Referring Provider: Dr. Marcelyn Ditty  Encounter Date: 09/10/2016      PT End of Session - 09/10/16 1310    Visit Number 2   Number of Visits 10   Date for PT Re-Evaluation 10/14/16   Authorization Type medicare g-code 10th visit   PT Start Time 1232   PT Stop Time 1310   PT Time Calculation (min) 38 min   Activity Tolerance Patient tolerated treatment well   Behavior During Therapy [facility] for tasks assessed/performed      Past Medical History:  Diagnosis Date  . Anxiety    panic attacks  . Arthritis   . Asthma   . Blood transfusion    1973--with twins birth  . Diabetes mellitus    borderline  . H/O hiatal hernia    surgery fixed  . Heart murmur    MVP  . Hypertension   . Hypothyroidism     Past Surgical History:  Procedure Laterality Date  . ABDOMINAL HYSTERECTOMY    . APPENDECTOMY    . BACK SURGERY    . bladder tack    . BREAST CYST INCISION AND DRAINAGE    . BREAST SURGERY    . BUNIONECTOMY    . CHOLECYSTECTOMY    . FRACTURE SURGERY     left wrist  . LUMBAR LAMINECTOMY  03/04/2012   Procedure: MICRODISCECTOMY LUMBAR LAMINECTOMY;  Surgeon: Jessy Oto, MD;  Location: [place];  Service: Orthopedics;  Laterality: N/A;  L3-4 central laminectomy  . NASAL SEPTUM SURGERY    . nissan fundoplication    . OVARIAN CYST SURGERY    . TONSILLECTOMY      There were no vitals filed for this visit.      Subjective Assessment - 09/10/16 1237    Subjective I still wear a pad. I am doing better going to the bathroom. When I eat alot of vegetables and fruit I have to go more. My fecal incontinence has improved.    Patient Stated Goals control muscles in rectum to reduce fecal incontinence   Currently  in Pain? No/denies        g-code: Functional limitation tool used is FOTO score 50% limitaiton; functional limitation Other PT primary; Goal is CJ, Discharge status is CK.  Earlie Counts, PT 10/14/16 5:06 PM                   [facility] PT Treatment/Exercise - 09/10/16 0001      Self-Care   Self-Care Other Self-Care Comments   Other Self-Care Comments  discussed about diet and what is good to ear, what can cause urinary leakage                PT Education - 09/10/16 1309    Education provided Yes   Education Details pelvic floor exercises with leg movements and relax buttocks; bladder irritants   Methods Explanation;Demonstration;Verbal cues   Comprehension Returned demonstration;Verbalized understanding          PT Short Term Goals - 09/10/16 1239      PT SHORT TERM GOAL #1   Title understand what bladder irritants are and how to incorportate fiber into her diet   Period Weeks   Status On-going     PT SHORT TERM  GOAL #2   Title ability to contract the pelvic floor with lower abdominal contraction to reduce urinary and fecal leakage   Time 4   Period Weeks   Status On-going     PT SHORT TERM GOAL #3   Title able to go to the bathroom every 2 hours to do timed voiding to reduce leakage   Time 4   Period Weeks   Status On-going     PT SHORT TERM GOAL #4   Title review bowel and bladder diary to understand what foods and activities increase leakage   Time 4   Period Weeks   Status New           PT Long Term Goals - 08/19/16 1219      PT LONG TERM GOAL #1   Title independent with HEP and how to progress herself   Time 8   Period Weeks   Status New     PT LONG TERM GOAL #2   Title fecal leakage decreased >/= 50% due to increased pelvic floor strength and reduction of Poise pads to </= 2 per day   Time 8   Period Weeks   Status New     PT LONG TERM GOAL #3   Title urinary leakage decreased >/= 50% due to ability to contract the  pelvic floor and not bear down   Time 8   Period Weeks   Status New     PT LONG TERM GOAL #4   Title FOTO score of bowel leakage </= 34%    Time 8   Period Weeks   Status New               Plan - 09/10/16 1310    Clinical Impression Statement Patient is still wearing a Pose pad 1 time per day. Patient has less fecal incontinence due to eating more bulk. Patient is learning to contract pelvic floor better and uses a sheet to pull up on for tactle cues.  Patient did not fill out the bladder diary but it my be difficult for her.  Patient may benefit from a nutritionist to go over a diet with her due to her sensitivity to food. Patient will benefit form skilled therapy to improve pelvic floor strength to reduce urinary and fecal leakage.    Rehab Potential Good   Clinical Impairments Affecting Rehab Potential none   PT Frequency 1x / week   PT Duration 8 weeks   PT Treatment/Interventions Biofeedback;Neuromuscular re-education;Therapeutic exercise;Therapeutic activities;Manual techniques;Scar mobilization   PT Next Visit Plan pelvic floor EMG; abdominal bracing;    PT Home Exercise Plan pelvic floor exercise   Consulted and Agree with Plan of Care Patient      Patient will benefit from skilled therapeutic intervention in order to improve the following deficits and impairments:  Pain, Decreased strength, Decreased endurance  Visit Diagnosis: Muscle weakness (generalized)  Unspecified lack of coordination     Problem List Patient Active Problem List   Diagnosis Date Noted  . History of lumbar fusion 08/03/2016    Class: Chronic  . Migraine 04/09/2016  . Acute-on-chronic kidney injury ([place]) 09/07/2015  . Hypertension 09/07/2015  . Hypothyroidism 09/07/2015  . Gout 09/07/2015  . Low back pain 09/07/2015  . Obesity (BMI 30-39.9) 07/06/2013  . Headache 04/12/2013  . Concussion 04/12/2013  . Lumbar spinal stenosis 03/04/2012    Class: Diagnosis of    Earlie Counts,  PT 09/10/16 1:15 PM   [facility] [address] [place], [place], [address] Phone: [phone]   Fax:  [phone]  Name: Becky Hobbs MRN: [id number] Date of Birth: [birth year]  PHYSICAL THERAPY DISCHARGE SUMMARY  Visits from Start of Care: 3  Current functional level related to goals / functional outcomes: See above. Patient cancelled her last appoints due to oral surgery but has not scheduled any further.    Remaining deficits: See above.   Education / Equipment: HEP Plan:                                                    Patient goals were not met. Patient is being discharged due to not returning since the last visit.  Thank you for the referral. Earlie Counts, PT 10/14/16 5:09 PM  ?????

## 2016-09-10 NOTE — Patient Instructions (Addendum)
Certain foods and liquids will decrease the pH making the urine more acidic.  Urinary urgency increases when the urine has a low pH.  Most common irritants: alcohol, carbonated beverages and caffinated beverages.  Foods to avoid: apple juice, apples, ascorbic acid, canteloupes, chili, citrus fruits, coffee, cranberries, grapes, guava, peaches, pepper, pineapple, plums, strawberries, tea, tomatoes, and vinegar.  Drinking plenty of water may help to increase the pH and dilute out any of the effects of specific irritants.  Foods that are NOT irritating to the bladder include: Pears, papayas, sun-brewed teas, watermelons, non-citrus herbal teas, apricots, kava and low-acid instant drinks (Postum)  [facility] ? [address], [place], [place] [address] [phone]   Walk everyday for 30 minutes outside or inside    Adduction: Hip - Knees Together With Pelvic Floor (Hook-Lying)    Lie with hips and knees bent, towel roll between knees. Squeeze pelvic floor while pushing knees together. Hold for __5_ seconds. Rest for _5__ seconds. Repeat _10__ times. Do _4__ times a day. Place a sheet under your buttocks and pull up between your knees. Pull the end of the sheet toward your head as you contract the pelvic floor. Make sure you are breathing. Do not contract the buttocks.   Copyright  VHI. All rights reserved.    [facility] [address], [place], [place] [address] Phone # [phone] Fax [phone] Bracing With Bridging (Hook-Lying)    With neutral spine, tighten pelvic floor and abdominals and hold. Lift bottom. Repeat _10__ times. Do _1__ times a day. Squeeze a ball between your knees as you do this.   Copyright  VHI. All rights reserved.  Bracing With Knee Fallout (Hook-Lying)    With neutral spine, tighten pelvic floor and abdominals and hold. Alternating legs, drop knee out to side. Keep opposite  hip still. Repeat _10__ times. Do _1__ times a day.   Copyright  VHI. All rights reserved.  [facility] [address] [place], [place] [address] Phone # [phone] Fax [phone]

## 2016-09-14 NOTE — Telephone Encounter (Signed)
Pt called again about this letter. Becky Hobbs [phone] personal fax

## 2016-09-14 NOTE — Telephone Encounter (Signed)
Please advise on letter. Patient states she has meeting with layer and needs this letter stating she was cleared back in 2014 to go to gym. I don't see a letter in the old chart stating anything about gym. Please let me know and I can call patient.

## 2016-09-14 NOTE — Telephone Encounter (Signed)
Pt checking the status of the letter states she need the letter by Tuesday 09/15/16 afternoon. She an appointment on Wed 09/16/16 @ 10:00am with her lawyer.

## 2016-09-16 ENCOUNTER — Telehealth (INDEPENDENT_AMBULATORY_CARE_PROVIDER_SITE_OTHER): Payer: Self-pay | Admitting: Specialist

## 2016-09-16 DIAGNOSIS — Z6836 Body mass index (BMI) 36.0-36.9, adult: Secondary | ICD-10-CM | POA: Diagnosis not present

## 2016-09-16 DIAGNOSIS — Z01419 Encounter for gynecological examination (general) (routine) without abnormal findings: Secondary | ICD-10-CM | POA: Diagnosis not present

## 2016-09-17 ENCOUNTER — Ambulatory Visit: Payer: Medicare Other | Attending: Gastroenterology | Admitting: Physical Therapy

## 2016-09-18 DIAGNOSIS — Z1231 Encounter for screening mammogram for malignant neoplasm of breast: Secondary | ICD-10-CM | POA: Diagnosis not present

## 2016-09-24 ENCOUNTER — Encounter: Payer: Medicare Other | Admitting: Physical Therapy

## 2016-10-07 NOTE — Telephone Encounter (Signed)
ERROR

## 2016-10-14 DIAGNOSIS — M4326 Fusion of spine, lumbar region: Secondary | ICD-10-CM | POA: Diagnosis not present

## 2016-10-14 DIAGNOSIS — M7061 Trochanteric bursitis, right hip: Secondary | ICD-10-CM | POA: Diagnosis not present

## 2016-10-14 DIAGNOSIS — G894 Chronic pain syndrome: Secondary | ICD-10-CM | POA: Diagnosis not present

## 2016-11-09 DIAGNOSIS — K7460 Unspecified cirrhosis of liver: Secondary | ICD-10-CM | POA: Diagnosis not present

## 2016-11-09 DIAGNOSIS — I10 Essential (primary) hypertension: Secondary | ICD-10-CM | POA: Diagnosis not present

## 2016-11-09 DIAGNOSIS — D485 Neoplasm of uncertain behavior of skin: Secondary | ICD-10-CM | POA: Diagnosis not present

## 2016-11-09 DIAGNOSIS — E038 Other specified hypothyroidism: Secondary | ICD-10-CM | POA: Diagnosis not present

## 2016-11-09 DIAGNOSIS — E784 Other hyperlipidemia: Secondary | ICD-10-CM | POA: Diagnosis not present

## 2016-11-09 DIAGNOSIS — N184 Chronic kidney disease, stage 4 (severe): Secondary | ICD-10-CM | POA: Diagnosis not present

## 2016-11-09 DIAGNOSIS — E119 Type 2 diabetes mellitus without complications: Secondary | ICD-10-CM | POA: Diagnosis not present

## 2016-11-18 ENCOUNTER — Encounter (INDEPENDENT_AMBULATORY_CARE_PROVIDER_SITE_OTHER): Payer: Self-pay | Admitting: Specialist

## 2016-11-18 ENCOUNTER — Ambulatory Visit (INDEPENDENT_AMBULATORY_CARE_PROVIDER_SITE_OTHER): Payer: Medicare Other

## 2016-11-18 ENCOUNTER — Ambulatory Visit (INDEPENDENT_AMBULATORY_CARE_PROVIDER_SITE_OTHER): Payer: Medicare Other | Admitting: Specialist

## 2016-11-18 VITALS — BP 122/65 | HR 66 | Ht 64.0 in | Wt 200.0 lb

## 2016-11-18 DIAGNOSIS — M7061 Trochanteric bursitis, right hip: Secondary | ICD-10-CM

## 2016-11-18 DIAGNOSIS — M25551 Pain in right hip: Secondary | ICD-10-CM | POA: Diagnosis not present

## 2016-11-18 DIAGNOSIS — Z981 Arthrodesis status: Secondary | ICD-10-CM | POA: Diagnosis not present

## 2016-11-18 MED ORDER — TIZANIDINE HCL 2 MG PO TABS: ORAL_TABLET | ORAL | 3 refills | Status: DC

## 2016-11-18 MED ORDER — BUPIVACAINE HCL 0.25 % IJ SOLN
4.0000 mL | INTRAMUSCULAR | Status: AC | PRN
  Administered 2016-11-18: 4 mL via INTRA_ARTICULAR

## 2016-11-18 MED ORDER — METHYLPREDNISOLONE ACETATE 40 MG/ML IJ SUSP
40.0000 mg | INTRAMUSCULAR | Status: AC | PRN
  Administered 2016-11-18: 40 mg via INTRA_ARTICULAR

## 2016-11-18 NOTE — Progress Notes (Signed)
Office Visit Note   Patient: Becky Hobbs           Date of Birth: March 31, 1946           MRN: [id number] Visit Date: 11/18/2016              Requested by: Marton Redwood, MD [address] [place], [place] [address] PCP: Marton Redwood, MD   Assessment & Plan: Visit Diagnoses:  1. Pain in right hip   2. Trochanteric bursitis, right hip   3. History of lumbar fusion     Plan: Right is suffering from bursitis, only real proven treatments are Weight loss, NSIADs like tylenol and exercise. Well padded shoes help. Ice the lateral 2-3 times a day 15-20 mins at a time. Hot shower and stretching exercises.    Follow-Up Instructions: Return in about 3 months (around 02/15/2017).   Orders:  Orders Placed This Encounter  Procedures  . Large Joint Injection/Arthrocentesis  . XR HIP UNILAT W OR W/O PELVIS 1V RIGHT   Meds ordered this encounter  Medications  . tiZANidine (ZANAFLEX) 2 MG tablet    Sig: TK 1 TO 2 T PO Q 6 H PRF SPASM    Dispense:  60 tablet    Refill:  3      Procedures: Large Joint Inj Date/Time: 11/18/2016 11:43 AM Performed by: Jessy Oto Authorized by: Jessy Oto   Consent Given by:  Patient Site marked: the procedure site was marked   Timeout: prior to procedure the correct patient, procedure, and site was verified   Indications:  Pain Location:  Hip Site:  R greater trochanter Prep: patient was prepped and draped in usual sterile fashion   Needle Size:  22 G Needle Length:  3.5 inches Approach:  Superolateral Ultrasound Guidance: No   Fluoroscopic Guidance: No   Arthrogram: No   Medications:  40 mg methylPREDNISolone acetate 40 MG/ML; 4 mL bupivacaine 0.25 % Aspiration Attempted: No   Patient tolerance:  Patient tolerated the procedure well with no immediate complications  bandaid applied     Clinical Data: No additional findings.   Subjective: Chief Complaint  Patient presents with  . Lower Back - Follow-up, Pain    [name]  is here for her 3 month follow up on her back.  She states that her back is doing better, however she is having some cramps in both of her legs.  She is also complaining of bursitis in her right hip.    Review of Systems  Constitutional: Negative.   HENT: Negative.   Eyes: Negative.   Respiratory: Negative.   Cardiovascular: Negative.   Gastrointestinal: Negative.   Endocrine: Negative.   Genitourinary: Negative.   Musculoskeletal: Negative.   Skin: Negative.   Allergic/Immunologic: Negative.   Neurological: Negative.   Hematological: Negative.   Psychiatric/Behavioral: Negative.      Objective: Vital Signs: BP 122/65 (BP Location: Left Arm, Patient Position: Sitting)   Pulse 66   Ht 5\' 4"  (1.626 m)   Wt 200 lb (90.7 kg)   BMI 34.33 kg/m   Physical Exam  Constitutional: She is oriented to person, place, and time. She appears well-developed and well-nourished.  HENT:  Head: Normocephalic and atraumatic.  Eyes: EOM are normal. Pupils are equal, round, and reactive to light.  Neck: Normal range of motion. Neck supple.  Pulmonary/Chest: Effort normal and breath sounds normal.  Abdominal: Soft. Bowel sounds are normal.  Neurological: She is alert and oriented to person, place,  and time.  Skin: Skin is warm and dry.  Psychiatric: She has a normal mood and affect. Her behavior is normal. Judgment and thought content normal.    Right Hip Exam   Tenderness  The patient is experiencing tenderness in the greater trochanter.  Range of Motion  Extension: normal  Flexion: normal  Internal Rotation: normal  External Rotation: normal  Abduction: abnormal  Adduction: normal   Muscle Strength  Abduction: 5/5  Adduction: 5/5  Flexion: 5/5   Tests  FABER: negative Ober: positive  Other  Erythema: absent Scars: absent Sensation: normal Pulse: present   Left Hip Exam  Left hip exam is normal.   Back Exam   Tenderness  The patient is experiencing tenderness in  the lumbar and sacroiliac.  Range of Motion  Extension: normal  Flexion: abnormal  Lateral Bend Right: abnormal  Lateral Bend Left: abnormal  Rotation Right: abnormal  Rotation Left: abnormal   Muscle Strength  Right Quadriceps:  5/5  Left Quadriceps:  5/5  Right Hamstrings:  5/5  Left Hamstrings:  5/5   Tests  Straight leg raise right: negative Straight leg raise left: negative  Reflexes  Patellar: abnormal Achilles: abnormal Babinski's sign: normal   Other  Toe Walk: normal Heel Walk: normal Sensation: decreased Gait: normal  Erythema: no back redness Scars: present      Specialty Comments:  No specialty comments available.  Imaging: Xr Hip Unilat W Or W/o Pelvis 1v Right  Result Date: 11/18/2016 AP and lateral right hip no fracture dislocation or dislocation, fusion L5-S1, right greater trochanter with a minimal spur superolateral, no lucency. Leg length appears equal    PMFS History: Patient Active Problem List   Diagnosis Date Noted  . History of lumbar fusion 08/03/2016    Priority: High    Class: Chronic  . Migraine 04/09/2016  . Acute-on-chronic kidney injury ([place]) 09/07/2015  . Hypertension 09/07/2015  . Hypothyroidism 09/07/2015  . Gout 09/07/2015  . Low back pain 09/07/2015  . Obesity (BMI 30-39.9) 07/06/2013  . Headache 04/12/2013  . Concussion 04/12/2013  . Lumbar spinal stenosis 03/04/2012    Class: Diagnosis of   Past Medical History:  Diagnosis Date  . Anxiety    panic attacks  . Arthritis   . Asthma   . Blood transfusion    1973--with twins birth  . Diabetes mellitus    borderline  . H/O hiatal hernia    surgery fixed  . Heart murmur    MVP  . Hypertension   . Hypothyroidism     Family History  Problem Relation Age of Onset  . Kidney disease Mother   . Vascular Disease Mother     Past Surgical History:  Procedure Laterality Date  . ABDOMINAL HYSTERECTOMY    . APPENDECTOMY    . BACK SURGERY    . bladder tack      . BREAST CYST INCISION AND DRAINAGE    . BREAST SURGERY    . BUNIONECTOMY    . CHOLECYSTECTOMY    . FRACTURE SURGERY     left wrist  . LUMBAR LAMINECTOMY  03/04/2012   Procedure: MICRODISCECTOMY LUMBAR LAMINECTOMY;  Surgeon: Jessy Oto, MD;  Location: [facility];  Service: Orthopedics;  Laterality: N/A;  L3-4 central laminectomy  . NASAL SEPTUM SURGERY    . nissan fundoplication    . OVARIAN CYST SURGERY    . TONSILLECTOMY     Social History   Occupational History  . retired  Retired   Social History Main Topics  . Smoking status: Never Smoker  . Smokeless tobacco: Never Used  . Alcohol use No  . Drug use: No  . Sexual activity: No

## 2016-11-18 NOTE — Patient Instructions (Signed)
  Right is suffering from bursitis, only real proven treatments are Weight loss, NSIADs like tylenol and exercise. Well padded shoes help. Ice the lateral 2-3 times a day 15-20 mins at a time. Hot shower and stretching exercises.

## 2016-12-02 DIAGNOSIS — N183 Chronic kidney disease, stage 3 (moderate): Secondary | ICD-10-CM | POA: Diagnosis not present

## 2016-12-02 DIAGNOSIS — N2581 Secondary hyperparathyroidism of renal origin: Secondary | ICD-10-CM | POA: Diagnosis not present

## 2016-12-02 DIAGNOSIS — D649 Anemia, unspecified: Secondary | ICD-10-CM | POA: Diagnosis not present

## 2016-12-09 ENCOUNTER — Encounter: Payer: Self-pay | Admitting: Neurology

## 2016-12-09 ENCOUNTER — Ambulatory Visit (INDEPENDENT_AMBULATORY_CARE_PROVIDER_SITE_OTHER): Payer: Medicare Other | Admitting: Neurology

## 2016-12-09 VITALS — BP 138/75 | HR 69 | Ht 64.0 in | Wt 209.0 lb

## 2016-12-09 DIAGNOSIS — B1920 Unspecified viral hepatitis C without hepatic coma: Secondary | ICD-10-CM | POA: Diagnosis not present

## 2016-12-09 DIAGNOSIS — G4719 Other hypersomnia: Secondary | ICD-10-CM | POA: Diagnosis not present

## 2016-12-09 DIAGNOSIS — R51 Headache: Secondary | ICD-10-CM | POA: Diagnosis not present

## 2016-12-09 DIAGNOSIS — R945 Abnormal results of liver function studies: Secondary | ICD-10-CM | POA: Diagnosis not present

## 2016-12-09 DIAGNOSIS — E1129 Type 2 diabetes mellitus with other diabetic kidney complication: Secondary | ICD-10-CM | POA: Diagnosis not present

## 2016-12-09 DIAGNOSIS — R519 Headache, unspecified: Secondary | ICD-10-CM

## 2016-12-09 DIAGNOSIS — R682 Dry mouth, unspecified: Secondary | ICD-10-CM | POA: Diagnosis not present

## 2016-12-09 DIAGNOSIS — E611 Iron deficiency: Secondary | ICD-10-CM | POA: Diagnosis not present

## 2016-12-09 DIAGNOSIS — N183 Chronic kidney disease, stage 3 (moderate): Secondary | ICD-10-CM | POA: Diagnosis not present

## 2016-12-09 DIAGNOSIS — F0781 Postconcussional syndrome: Secondary | ICD-10-CM | POA: Diagnosis not present

## 2016-12-09 DIAGNOSIS — N179 Acute kidney failure, unspecified: Secondary | ICD-10-CM | POA: Diagnosis not present

## 2016-12-09 DIAGNOSIS — E669 Obesity, unspecified: Secondary | ICD-10-CM | POA: Diagnosis not present

## 2016-12-09 DIAGNOSIS — I10 Essential (primary) hypertension: Secondary | ICD-10-CM | POA: Diagnosis not present

## 2016-12-09 MED ORDER — NORTRIPTYLINE HCL 10 MG PO CAPS: 10.0000 mg | ORAL_CAPSULE | Freq: Every day | ORAL | 4 refills | Status: DC

## 2016-12-09 NOTE — Progress Notes (Addendum)
[facility]    Provider:  Dr Jaynee Eagles Referring Provider: Marton Redwood, MD Primary Care Physician:  Marton Redwood, MD  CC: migraine  Interval history 12/09/2016: Nortriptyline is working for migraines, helps her sleep and no significant side effects during the day. She is chronically fatigued. We ordered an MRI brain at last appointment, she did not go and we will reschedule. Just fatigued. She has morning headaches. Sister says she snores. She wakes up tired. She sleeps with water by her bed because her mouth gets so dry overnight. Epworth Sleepiness Scale 15, will refer to sleep eval  Interval history 09/09/2016: Amitriptyline helped her migraines. But it made her mouth dry.  Headaches improved on alprazolam. Since she stopped the Amitriptyline her migraines/headaches are worse. She denies dizziness. No lightheadedness. She has had vertigo in the past. She has a dull headache today. No snoring at night. No often morning headaches. Discussed nortriptyline as this can have less side effects than amitriptyline as a preventative and can help with her insomnia. When she takes the imitrex it helps acutely. She reports Imbalance, falls. She reports worsening balance and falls, falling to the right, right sided weakness will check MRI of the brain to evaluate for strokes or other causes.   Tried and failed: Topamax, Amitriptyline.  Interval history 04/09/2016: She still has migraines. She is having 2-3 migraines a week. They are on the right side, thumping, pounding. She takes an imitrex or an [place] and goes to bed. They are lasting hours. She is not taking the topiramate, she had kidney failure and stopped taking. Sh eis having a lot of insomnia. She takes gabapentin and she has to take [place]. She has a significant problem sleeping. Migraines with some nausea. She has significant insomnia, talked about this a little, good sleep habits. Discussed and insomnia counselor. We'll start  amitriptyline at night for migraine prevention which can help with insomnia as well. Discussed side effects such as QT prolongation, somnolence, fatigue, risk of falls and others per patient instructions.  Tried and failed: Topamax, Amitriptyline.  Interval history: Migraines are stable. She is still having them once a week. She takes the triptan then they go away. For acute management, she takes just one triptan. Then she sleeps and it goes away. She hasn't really been taking the Topamax, she said it made her feel like she had cognitive problems. Discussed that Topamax can do and that we could probably decrease the dose. Imitrex helps but it does not make the headache go away, she still has to sleep for a while. Discussed trying some samples of Treximet or Relpax which might work better but do not use them together. Use them both just like the Imitrex, at onset of headache may repeat in 2 hours do not use more than 2 times in 24 hours or 2 days a week. She also has Qysmia on her med list for weight loss, I did advise her that this drug does have Topamax and is well so to discuss that with prescribing physician so as not to unknowingly increased dosage of one medication.  Previous History Dr. Krista Blue:  Annesha is a 71 years old right-handed African American female, referred by her primary care physician Dr. Marton Redwood for evaluation of headaches, concussion, and gait difficulty, initial visit was in July 2014.   She had past medical history of hypertension, diabetes, hypothyroidism, in May 12th 2014, she came downstairs, stepped into a puddle of water, fell backwards, landed on her occipital area,  there was no loss of consciousness, she could go on cleaning the floor, it was a leakage from 3 gallon water container, she has no difficulty completing the task, afterwards, she drove to her nephew's house, by then she was noticed to stumbled over, couple days later, she began to have frequent headaches,  vertex bilateral retro-orbital area severe pounding headaches with associated light noise sensitivity, she also complains of constant gait difficulty, bilateral lower extremity tremorish sensation when bearing weight,   She also has difficulty sleeping, her headache are pouding, movements make it worse, with associated light noise sensitivity, she denies visual loss, she complains of neck pain, radiating pain to her bilateral shoulder. She denies bowel and bladder incontinence   CT head without contrast in June 2014 showed no acute lesions,  MRI of cervical spine in September 2013 evaluating for gait difficulty, bilateral upper extremity paresthesia showed multilevel degenerative disc disease, most severe C5-6, C6 and 7 with mild canal stenosis, mild to moderate left more than right foraminal stenosis.   04/21/13 scan of cervical spine showing prominent spondylitic changes most prominent at C5-6 and C6-7 where there is a broad-based disc osteophyte complexes resulting in mild canal and moderate left greater than right foraminal narrowing. Overall no significant changes compared with previous MRI scan dated 07/01/2012.    UPDATE Sept 29th 2015:  Her headache overall has much improved, instead of daily headaches, she is having headaches 2-3 times each week, starting at the right parietal area, pounding, with associated light noise sensitivity, nauseous, movements make it worse, she preferred to lie down in dark quiet room, lasting half days,  She did have a history of migraine headaches in the past  Trigger for her headaches are stress, sleep deprivation, insomnia, she continued having low back pain, has been receving epidural injections by Dr. Maia Petties  ROS: Patient complains of the following review of systems, endorses blurry vision, runny nose, insomnia, incontinence of bladder, back pain, headache, decreased concentration. All others negative.   Social History   Social History  .  Marital status: Widowed    Spouse name: N/A  . Number of children: 3  . Years of education: college   Occupational History  . retired     Retired   Social History Main Topics  . Smoking status: Never Smoker  . Smokeless tobacco: Never Used  . Alcohol use No  . Drug use: No  . Sexual activity: No   Other Topics Concern  . Not on file   Social History Narrative   Patient has a high school education.    Patient is widow.   Patient is retired.    Family History  Problem Relation Age of Onset  . Kidney disease Mother   . Vascular Disease Mother     Past Medical History:  Diagnosis Date  . Anxiety    panic attacks  . Arthritis   . Asthma   . Blood transfusion    1973--with twins birth  . Diabetes mellitus    borderline  . H/O hiatal hernia    surgery fixed  . Heart murmur    MVP  . Hypertension   . Hypothyroidism     Past Surgical History:  Procedure Laterality Date  . ABDOMINAL HYSTERECTOMY    . APPENDECTOMY    . BACK SURGERY    . bladder tack    . BREAST CYST INCISION AND DRAINAGE    . BREAST SURGERY    . BUNIONECTOMY    . CHOLECYSTECTOMY    .  FRACTURE SURGERY     left wrist  . LUMBAR LAMINECTOMY  03/04/2012   Procedure: MICRODISCECTOMY LUMBAR LAMINECTOMY;  Surgeon: Jessy Oto, MD;  Location: [facility];  Service: Orthopedics;  Laterality: N/A;  L3-4 central laminectomy  . NASAL SEPTUM SURGERY    . nissan fundoplication    . OVARIAN CYST SURGERY    . TONSILLECTOMY      Current Outpatient Prescriptions  Medication Sig Dispense Refill  . albuterol (PROVENTIL HFA;VENTOLIN HFA) 108 (90 BASE) MCG/ACT inhaler Inhale 2 puffs into the lungs every 6 (six) hours as needed for wheezing or shortness of breath. 1 Inhaler 6  . allopurinol (ZYLOPRIM) 100 MG tablet Take 100 mg by mouth daily.    Marland Kitchen ALPRAZolam (XANAX) 0.5 MG tablet Take 0.5 mg by mouth daily as needed for anxiety.     . ALPRAZolam (XANAX) 0.5 MG tablet Take 1-2 tablets 30 min prior to test. Can take  additional tablet at time of test if needed. Do not drive. Must have driver. Can cause drowsiness. 3 tablet 0  . aspirin EC 81 MG tablet Take 81 mg by mouth daily.    Marland Kitchen bismuth subsalicylate (PEPTO BISMOL) 262 MG/15ML suspension Take 30 mLs by mouth every 6 (six) hours as needed for indigestion or diarrhea or loose stools.    . cetirizine (ZYRTEC) 10 MG tablet Take 10 mg by mouth daily.    . Cholecalciferol 2000 UNITS CAPS Take 4,000 Units by mouth daily.    Marland Kitchen EPINEPHrine (EPIPEN 2-PAK) 0.3 mg/0.3 mL IJ SOAJ injection Inject 0.3 mLs (0.3 mg total) into the skin once. (Patient taking differently: Inject 0.3 mg into the skin daily as needed (allergic reaction). ) 2 Device 4  . fluticasone (FLONASE) 50 MCG/ACT nasal spray INT 2 SPRAYS INTO EACH NOSTRIL QHS  5  . FOLIC [id number] PO Take by mouth daily.    Marland Kitchen gabapentin (NEURONTIN) 300 MG capsule Take 2 tablets (2-300 mg tablets) qhs and one 300mg  tablet every AM 90 capsule 6  . levothyroxine (SYNTHROID, LEVOTHROID) 88 MCG tablet Take 88 mcg by mouth daily before breakfast.     . losartan (COZAAR) 100 MG tablet Take 100 mg by mouth daily.    . metoprolol succinate (TOPROL-XL) 100 MG 24 hr tablet Take 100 mg by mouth daily. Take with or immediately following a meal.    . Multiple Vitamin (MULITIVITAMIN WITH MINERALS) TABS Take 1 tablet by mouth daily.    . nortriptyline (PAMELOR) 10 MG capsule Take 1 capsule (10 mg total) by mouth at bedtime. 90 capsule 4  . ondansetron (ZOFRAN) 4 MG tablet Take 1 tablet (4 mg total) by mouth every 8 (eight) hours as needed for nausea. 30 tablet 6  . ondansetron (ZOFRAN-ODT) 4 MG disintegrating tablet Take 1 tablet (4 mg total) by mouth every 8 (eight) hours as needed for nausea. 60 tablet 12  . oxyCODONE-acetaminophen (PERCOCET/ROXICET) 5-325 MG per tablet Take 1 tablet by mouth every 6 (six) hours as needed for moderate pain.   0  . potassium chloride SA (K-DUR,KLOR-CON) 20 MEQ tablet Take 20 mEq by mouth daily as  needed (fluid). Takes with torsemide.    . Probiotic Product (PROBIOTIC PO) Take 1 capsule by mouth daily.    . SUMAtriptan (IMITREX) 100 MG tablet Take 1 tablet (100 mg total) by mouth once as needed for migraine. May repeat in 2 hours if headache persists or recurs. 12 tablet 12  . tiZANidine (ZANAFLEX) 2 MG tablet TK 1 TO 2 T  PO Q 6 H PRF SPASM 60 tablet 3  . torsemide (DEMADEX) 20 MG tablet Take 1 tablet (20 mg total) by mouth daily as needed (fluid). For edema. 30 tablet 0  . zolpidem (AMBIEN) 10 MG tablet Take 10 mg by mouth at bedtime as needed for sleep.     . rosuvastatin (CRESTOR) 10 MG tablet      No current facility-administered medications for this visit.     Allergies as of 12/09/2016 - Review Complete 12/09/2016  Allergen Reaction Noted  . Morphine and related Itching and Other (See Comments) 03/04/2012  . Other Anaphylaxis 07/10/2014  . Erythromycin Hives and Itching 05/12/2012  . Hydrocodone Itching 03/01/2012  . Penicillins Hives 03/01/2012  . Tramadol hcl Itching 01/04/2014  . Hydrocodone-acetaminophen Itching 07/10/2014    Vitals: BP 138/75   Pulse 69   Ht 5\' 4"  (1.626 m)   Wt 209 lb (94.8 kg)   BMI 35.87 kg/m  Last Weight:  Wt Readings from Last 1 Encounters:  12/09/16 209 lb (94.8 kg)   Last Height:   Ht Readings from Last 1 Encounters:  12/09/16 5\' 4"  (1.626 m)     Exam: Gen: NAD, conversant, well nourised, obese, well groomed  CV: RRR, no MRG. No Carotid Bruits. No peripheral edema, warm, nontender Eyes: Conjunctivae clear without exudates or hemorrhage  Neuro: Detailed Neurologic Exam  Speech:  Speech is normal; fluent and spontaneous with normal comprehension.  Cognition:  The patient is oriented to person, place, and time;  Cranial Nerves:  The pupils are equal, round, and reactive to light. Visual fields are full to finger confrontation. Extraocular movements are intact. Trigeminal sensation is intact and  the muscles of mastication are normal. The face is symmetric. The palate elevates in the midline. Hearing intact. Voice is normal. Shoulder shrug is normal. The tongue has normal motion without fasciculations.   Coordination:  Normal finger to nose and heel to shin.   Gait:  imbalance with heel to toe.   Motor Observation:  No asymmetry, no atrophy, and no involuntary movements noted. Tone:  Normal muscle tone.   Posture:  Posture is normal. normal erect   Strength:  Strength is V/V in the upper and lower limbs.    Sensation: intact , Romberg negative   Reflex Exam:  DTR's:  Deep tendon reflexes in the upper and lower extremities are brisk but symmetric bilaterally.  Toes:  The toes are downgoing bilaterally.  Clonus:  Clonus is absent.   Assessment/Plan: 71 year old female here for follow up of migraines. She has worsening balance ongoing for years but now worsening. Feels she has right-sided weakness, right side gives out.  Sleep evaluation: ESS 15, dry mouth in the morning, no restorative sleep, doesn't feel refreshed in the morning, unknown snoring, morning headaches, obesity  As far as your medications are concerned, I would like to suggest: Migraines: Nortriptyline every night before bed. Amitriptyline helped but had too many side effects. May help with insomnia as well as migraine. Imitrex at the onset of migraine. Can repeat in 2 hours, no more than 2x in a day Zofran for nausea Discussed side effects as documented in the patient instruction formed For falls and imbalance and right-sided deficits: MRI of the brain to evaluate for strokes. Fall risk, continue PT (She has had it multiple times, advised using a walking aid). May want to examine polypharmacy with pcp Will reorder MRI brain Follow-up 6 months  Orders Placed This Encounter  Procedures  .  Ambulatory referral to Sleep Studies    Referral Priority:   Routine     Referral Type:   Consultation    Referral Reason:   Specialty Services Required    Number of Visits Requested:   1    Sarina Ill, MD  [facility] [address] [place], [place] [address]  Phone [phone] Fax [phone]  A total of 30 minutes was spent face-to-face with this patient. Over half this time was spent on counseling patient on the migraine and imbalance diagnosis and different diagnostic and therapeutic options available.

## 2016-12-09 NOTE — Patient Instructions (Signed)
Remember to drink plenty of fluid, eat healthy meals and do not skip any meals. Try to eat protein with a every meal and eat a healthy snack such as fruit or nuts in between meals. Try to keep a regular sleep-wake schedule and try to exercise daily, particularly in the form of walking, 20-30 minutes a day, if you can.   As far as your medications are concerned, I would like to suggest: Continue Nortriptyline  As far as diagnostic testing: Sleep evaluation  Our phone number is [phone]. We also have an after hours call service for urgent matters and there is a physician on-call for urgent questions. For any emergencies you know to call 911 or go to the nearest emergency room

## 2016-12-14 DIAGNOSIS — M961 Postlaminectomy syndrome, not elsewhere classified: Secondary | ICD-10-CM | POA: Diagnosis not present

## 2016-12-14 DIAGNOSIS — Z6837 Body mass index (BMI) 37.0-37.9, adult: Secondary | ICD-10-CM | POA: Diagnosis not present

## 2016-12-14 DIAGNOSIS — M545 Low back pain: Secondary | ICD-10-CM | POA: Diagnosis not present

## 2016-12-14 DIAGNOSIS — Z79891 Long term (current) use of opiate analgesic: Secondary | ICD-10-CM | POA: Diagnosis not present

## 2016-12-14 DIAGNOSIS — G894 Chronic pain syndrome: Secondary | ICD-10-CM | POA: Diagnosis not present

## 2016-12-14 DIAGNOSIS — Z79899 Other long term (current) drug therapy: Secondary | ICD-10-CM | POA: Diagnosis not present

## 2016-12-15 ENCOUNTER — Telehealth: Payer: Self-pay | Admitting: Physical Therapy

## 2016-12-15 DIAGNOSIS — R42 Dizziness and giddiness: Secondary | ICD-10-CM | POA: Diagnosis not present

## 2016-12-15 DIAGNOSIS — R51 Headache: Secondary | ICD-10-CM | POA: Diagnosis not present

## 2016-12-15 NOTE — Telephone Encounter (Signed)
Called patient back and left a message. Earlie Counts, PT @3 /03/2017@ 2:32 PM

## 2016-12-15 NOTE — Telephone Encounter (Signed)
-----   Message from Carolynn Sayers sent at 12/15/2016  8:19 AM EST ----- Regarding: call patient Contact: [phone] Patient called and left a message for you to call her.

## 2016-12-16 ENCOUNTER — Ambulatory Visit (INDEPENDENT_AMBULATORY_CARE_PROVIDER_SITE_OTHER): Payer: Self-pay

## 2016-12-16 DIAGNOSIS — R2689 Other abnormalities of gait and mobility: Secondary | ICD-10-CM

## 2016-12-16 DIAGNOSIS — R519 Headache, unspecified: Secondary | ICD-10-CM

## 2016-12-16 DIAGNOSIS — Z0289 Encounter for other administrative examinations: Secondary | ICD-10-CM

## 2016-12-16 DIAGNOSIS — R51 Headache: Secondary | ICD-10-CM | POA: Diagnosis not present

## 2016-12-16 DIAGNOSIS — W19XXXA Unspecified fall, initial encounter: Secondary | ICD-10-CM

## 2016-12-16 DIAGNOSIS — G8929 Other chronic pain: Secondary | ICD-10-CM

## 2016-12-18 ENCOUNTER — Telehealth: Payer: Self-pay | Admitting: *Deleted

## 2016-12-18 NOTE — Telephone Encounter (Signed)
Per Dr Jaynee Eagles, spoke with patient and informed her that her MRI brain is normal for her age. She verbalized understanding, had no questions.

## 2016-12-22 ENCOUNTER — Ambulatory Visit (INDEPENDENT_AMBULATORY_CARE_PROVIDER_SITE_OTHER): Payer: Medicare Other | Admitting: Neurology

## 2016-12-22 ENCOUNTER — Encounter: Payer: Self-pay | Admitting: Neurology

## 2016-12-22 VITALS — BP 162/78 | HR 73 | Resp 20 | Ht 69.0 in | Wt 215.0 lb

## 2016-12-22 DIAGNOSIS — F32A Depression, unspecified: Secondary | ICD-10-CM

## 2016-12-22 DIAGNOSIS — G4724 Circadian rhythm sleep disorder, free running type: Secondary | ICD-10-CM | POA: Diagnosis not present

## 2016-12-22 DIAGNOSIS — F329 Major depressive disorder, single episode, unspecified: Secondary | ICD-10-CM

## 2016-12-22 DIAGNOSIS — R5383 Other fatigue: Secondary | ICD-10-CM

## 2016-12-22 DIAGNOSIS — G4710 Hypersomnia, unspecified: Secondary | ICD-10-CM

## 2016-12-22 NOTE — Patient Instructions (Signed)
Please remember to try to maintain good sleep hygiene, which means: Keep a regular sleep and wake schedule, try not to exercise or have a meal within 2 hours of your bedtime, try to keep your bedroom conducive for sleep, that is, cool and dark, without light distractors such as an illuminated alarm clock, and refrain from watching TV right before sleep or in the middle of the night and do not keep the TV or radio on during the night. Also, try not to use or play on electronic devices at bedtime, such as your cell phone, tablet PC or laptop. If you like to read at bedtime on an electronic device, try to dim the background light as much as possible. Do not eat in the middle of the night.
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
## 2017-01-06 DIAGNOSIS — Z6835 Body mass index (BMI) 35.0-35.9, adult: Secondary | ICD-10-CM | POA: Diagnosis not present

## 2017-01-06 DIAGNOSIS — M2669 Other specified disorders of temporomandibular joint: Secondary | ICD-10-CM | POA: Diagnosis not present

## 2017-01-19 ENCOUNTER — Telehealth (INDEPENDENT_AMBULATORY_CARE_PROVIDER_SITE_OTHER): Payer: Self-pay | Admitting: Specialist

## 2017-01-19 NOTE — Telephone Encounter (Signed)
I faxed medical records release form to pt as she called, left message on my vm requesting a release form be faxed to her at [phone].

## 2017-01-20 ENCOUNTER — Telehealth (INDEPENDENT_AMBULATORY_CARE_PROVIDER_SITE_OTHER): Payer: Self-pay | Admitting: Specialist

## 2017-01-20 NOTE — Telephone Encounter (Signed)
Patient called, left message on my vm.  I called her back and got her vm. I left msg advising that I faxed her a release form yesterday as she requested, to complete form and return to the office so that we can process it.

## 2017-01-21 ENCOUNTER — Ambulatory Visit (INDEPENDENT_AMBULATORY_CARE_PROVIDER_SITE_OTHER): Payer: Medicare Other | Admitting: Neurology

## 2017-01-21 DIAGNOSIS — G4710 Hypersomnia, unspecified: Secondary | ICD-10-CM | POA: Diagnosis not present

## 2017-01-21 DIAGNOSIS — F329 Major depressive disorder, single episode, unspecified: Secondary | ICD-10-CM

## 2017-01-21 DIAGNOSIS — R5383 Other fatigue: Secondary | ICD-10-CM

## 2017-01-21 DIAGNOSIS — G4724 Circadian rhythm sleep disorder, free running type: Secondary | ICD-10-CM

## 2017-01-21 DIAGNOSIS — F32A Depression, unspecified: Secondary | ICD-10-CM

## 2017-01-23 ENCOUNTER — Telehealth: Payer: Self-pay | Admitting: Neurology
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
## 2017-01-24 NOTE — Telephone Encounter (Signed)
I will discuss with Robin on Monday. CD

## 2017-01-26 ENCOUNTER — Ambulatory Visit: Payer: Medicare Other | Admitting: Neurology

## 2017-01-26 ENCOUNTER — Telehealth: Payer: Self-pay | Admitting: Neurology

## 2017-01-26 NOTE — Telephone Encounter (Signed)
Forwarded to Shirlean Mylar again and texted her to call patient.
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
## 2017-01-26 NOTE — Procedures (Signed)
PATIENT'S NAME:  Becky, Hobbs DOB:      1945-12-05      MR#:    [id number]     DATE OF RECORDING: 01/21/2017 REFERRING M.D.:  Marton Redwood, MD/ Sarina Ill, MD Study Performed:   Baseline Polysomnogram HISTORY:  Chronic headaches/ migraines treated  by  Dr. Jaynee Eagles. Further diagnosis of : Anxiety, Arthritis, Asthma, Diabetes, poorly controlled  Hypertension on 3 medications, Hypothyroidism, RLS and presenting with chronic fatigue and  excessive daytime sleepiness.   The patient endorsed the Epworth Sleepiness Scale at 15 / 24points.  FSS at 63 points. Depression score 7/ 15 points. Her  weight  is 216 pounds with a height of 69 (inches), resulting in a BMI of 32 kg/m2. The patient's neck circumference measured 14.5 inches.  CURRENT MEDICATIONS: Proventil, Allopurinol, Xanax, Aspirin, Zyrtec, Epipen, Flonase, Neurontin, Synthroid, Cozaar, Toprol, Pamelor, Zofran, Percocet, Klor-con, Requip, Crestor, Imitrex, Zanaflex, Demadex, Ambien   PROCEDURE:  This is a multichannel digital polysomnogram utilizing the Somnostar 11.2 system.  Electrodes and sensors were applied and monitored per AASM Specifications.   EEG, EOG, Chin and Limb EMG, were sampled at 200 Hz.  ECG, Snore and Nasal Pressure, Thermal Airflow, Respiratory Effort, CPAP Flow and Pressure, Oximetry was sampled at 50 Hz. Digital video and audio were recorded.      BASELINE STUDY Lights Out was at 22:54 and Lights On at 05:01.  Total recording time (TRT) was 367 minutes, with a total sleep time (TST) of  293 minutes.   The patient's sleep latency was 15 minutes.  REM latency was 246 minutes.  The sleep efficiency was 79.8 %.     SLEEP ARCHITECTURE: WASO (Wake after sleep onset) was 62 minutes.  There were 45.5 minutes in Stage N1, 218 minutes Stage N2, 15 minutes Stage N3 and 14.5 minutes in Stage REM.  The percentage of Stage N1 was 15.5%, Stage N2 was 74.4%, Stage N3 was 5.1% and Stage R (REM sleep) was 4.9%.   RESPIRATORY ANALYSIS:  There  were a total of 0 respiratory events:  0 obstructive apneas, 0 central apneas and 0 mixed apneas with a total of 0 apneas and an apnea index (AI) of 0 /hour. There were 0 hypopneas with a hypopnea index of 0 /hour. The patient also had 0 respiratory event related arousals (RERAs). The total APNEA/HYPOPNEA INDEX (AHI) was 0/hour and the total RESPIRATORY DISTURBANCE INDEX was 0 /hour.  The patient spent 95 minutes of total sleep time in the supine position and 198 minutes in non-supine.  OXYGEN SATURATION & C02:  The Wake baseline 02 saturation was 87%, with the lowest being 80%. Time spent below 89% saturation equaled 42 minutes. There was no hypercapnia.    PERIODIC LIMB MOVEMENTS:  The patient had a total of 0 Periodic Limb Movements.  The arousals were noted as: 49 were spontaneous, 0 were associated with PLMs, and 0 were associated with respiratory events.  Audio and video analysis did not show any abnormal or unusual movements, behaviors, phonations or vocalizations.  The patient took one  bathroom break. Snoring was noted.  EKG was in keeping with normal sinus rhythm (NSR).  Post-study, the patient indicated that sleep was the same as usual.    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 There is no hypoxemia, no evidence for an organic sleep disorder noted in this study.     RECOMMENDATIONS:  1. Avoid sedative-hypnotics which may worsen sleep apnea, and alcohol and tobacco (as applicable). 2. Advise  to lose weight, by diet and exercise if not contraindicated (BMI 32). 3. Advise patient to avoid driving or operating hazardous machinery when sleepy. 4. Reduction of medication that cause hypersomnia, discuss with the prescribing physicians. Reduce narcotic dose or discontinue narcotic if possible. 5. Avoid caffeine-containing beverages and chocolate. 6.  Improve sleep hygiene. Consider dedicated sleep psychology referral to address sleep hygiene. 7. HLA narcolepsy testing  recommended.  8. A follow up appointment will be scheduled in the Sleep Clinic at [facility]. The referring provider will be notified of the results.      I certify that I have reviewed the entire raw data recording prior to the issuance of this report in accordance with the Standards of Accreditation of the American Academy of Sleep Medicine (AASM)    Larey Seat, MD  01-26-2017 Diplomat, American Board of Psychiatry and Neurology  Diplomat, American Board of [place] Director, [facility] [employer]

## 2017-01-27 ENCOUNTER — Telehealth: Payer: Self-pay

## 2017-01-27 NOTE — Telephone Encounter (Signed)
I spoke to patient and was able to r/s to 5/15

## 2017-01-27 NOTE — Telephone Encounter (Signed)
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 There is no hypoxemia, no evidence for an organic sleep disorder  noted in this study.     RECOMMENDATIONS:  1. Avoid sedative-hypnotics 2. Advise to lose weight, by diet and exercise if not  contraindicated (BMI 32). 3. Advise patient to avoid driving or operating hazardous  machinery when sleepy. 4. Reduction of medication that cause hypersomnia, discuss with  the prescribing physicians. Reduce narcotic dose or discontinue  narcotic if possible. 5. Avoid caffeine-containing beverages and chocolate ( Anxiety provoking/ Insomnia) . 6. Improve sleep hygiene. Consider dedicated sleep psychology  referral to address sleep hygiene. 7. HLA narcolepsy testing recommended.  8. A follow up appointment will be scheduled in the Sleep Clinic  at [facility]. The referring provider will be  notified of the results.

## 2017-01-27 NOTE — Telephone Encounter (Signed)
I LM for patient with results below. I advised that Dr. Brett Fairy will discuss further at her up coming appt.

## 2017-01-28 ENCOUNTER — Telehealth: Payer: Self-pay

## 2017-01-28 NOTE — Telephone Encounter (Signed)
Left message for patient to call me back and discuss her rash from tape.

## 2017-02-04 ENCOUNTER — Other Ambulatory Visit (INDEPENDENT_AMBULATORY_CARE_PROVIDER_SITE_OTHER): Payer: Self-pay | Admitting: Specialist

## 2017-02-04 DIAGNOSIS — Z981 Arthrodesis status: Secondary | ICD-10-CM

## 2017-02-04 DIAGNOSIS — M7061 Trochanteric bursitis, right hip: Secondary | ICD-10-CM

## 2017-02-04 DIAGNOSIS — M25551 Pain in right hip: Secondary | ICD-10-CM

## 2017-02-04 NOTE — Telephone Encounter (Signed)
Please advise 

## 2017-02-08 ENCOUNTER — Other Ambulatory Visit: Payer: Self-pay | Admitting: Nurse Practitioner

## 2017-02-08 DIAGNOSIS — M4326 Fusion of spine, lumbar region: Secondary | ICD-10-CM | POA: Diagnosis not present

## 2017-02-08 DIAGNOSIS — M791 Myalgia: Secondary | ICD-10-CM | POA: Diagnosis not present

## 2017-02-08 DIAGNOSIS — K7469 Other cirrhosis of liver: Secondary | ICD-10-CM | POA: Diagnosis not present

## 2017-02-08 DIAGNOSIS — G894 Chronic pain syndrome: Secondary | ICD-10-CM | POA: Diagnosis not present

## 2017-02-08 DIAGNOSIS — Z79891 Long term (current) use of opiate analgesic: Secondary | ICD-10-CM | POA: Diagnosis not present

## 2017-02-08 DIAGNOSIS — M961 Postlaminectomy syndrome, not elsewhere classified: Secondary | ICD-10-CM | POA: Diagnosis not present

## 2017-02-18 ENCOUNTER — Encounter (INDEPENDENT_AMBULATORY_CARE_PROVIDER_SITE_OTHER): Payer: Self-pay | Admitting: Specialist

## 2017-02-18 ENCOUNTER — Ambulatory Visit (INDEPENDENT_AMBULATORY_CARE_PROVIDER_SITE_OTHER): Payer: Medicare Other | Admitting: Specialist

## 2017-02-18 VITALS — BP 135/66 | HR 71 | Ht 64.0 in | Wt 200.0 lb

## 2017-02-18 DIAGNOSIS — M25551 Pain in right hip: Secondary | ICD-10-CM | POA: Diagnosis not present

## 2017-02-18 NOTE — Progress Notes (Signed)
Office Visit Note   Patient: Becky Hobbs           Date of Birth: Nov 24, 1945           MRN: [id number] Visit Date: 02/18/2017              Requested by: Marton Redwood, MD [address] [place], [place] [address] PCP: Marton Redwood, MD   Assessment & Plan: Visit Diagnoses:  1. Pain in right hip     Plan: With ongoing chronic hip pain that has failed conservative treatment with multiple injections for bursitis and completing oral prednisone taper we will schedule right hip MRI to rule out bursitis versus tendinopathy. Follow-up after completion of her study to discuss results and further treatment options.  Follow-Up Instructions: Return in about 3 weeks (around 03/11/2017).   Orders:  Orders Placed This Encounter  Procedures  . MR Hip Right w/o contrast   No orders of the defined types were placed in this encounter.     Procedures: No procedures performed   Clinical Data: No additional findings.   Subjective: Chief Complaint  Patient presents with  . Right Hip - Follow-up    Had [place] injection here om 11/18/16 and @ [facility] on 02/08/17    [facility]   patient returns for follow-up for chronic right lateral hip pain. States that she had temporary improvement with injection that Dr. Abner Greenspan for bursitis. States that she had this injection repeated at Dr. Max:'s office by his physician assistant. She did not have any improvement with that one. They eventually prescribed an oral prednisone taper. Still no dramatic improvement. Pain was she is ambulating and lying on her right side. She also has soreness in her low back where she's had multilevel fusion.    Vital Signs: BP 135/66 (BP Location: Left Arm, Patient Position: Sitting)   Pulse 71   Ht 5\' 4"  (1.626 m)   Wt 200 lb (90.7 kg)   BMI 34.33 kg/m   Physical Exam  Constitutional: She is oriented to person, place, and time. She appears well-developed. No distress.  HENT:  Head:  Normocephalic and atraumatic.  Eyes: EOM are normal. Pupils are equal, round, and reactive to light.  Neck: Normal range of motion.  Pulmonary/Chest: No respiratory distress.  Musculoskeletal:  Patient has a Trendelenburg gait with a single-prong cane. Negative logroll bilateral hips. She is exquisitely tender over the right hip greater trochanter bursa and this also extends down the course of the IT band. Right greater left lumbar paraspinal tenderness. Negative straight leg raise. Bilateral calves are nontender  Neurological: She is alert and oriented to person, place, and time.  Skin: Skin is warm and dry.    Ortho Exam  Specialty Comments:  No specialty comments available.  Imaging: No results found.   PMFS History: Patient Active Problem List   Diagnosis Date Noted  . History of lumbar fusion 08/03/2016    Class: Chronic  . Migraine 04/09/2016  . Acute-on-chronic kidney injury ([place]) 09/07/2015  . Hypertension 09/07/2015  . Hypothyroidism 09/07/2015  . Gout 09/07/2015  . Low back pain 09/07/2015  . Obesity (BMI 30-39.9) 07/06/2013  . Headache 04/12/2013  . Concussion 04/12/2013  . Lumbar spinal stenosis 03/04/2012    Class: Diagnosis of   Past Medical History:  Diagnosis Date  . Anxiety    panic attacks  . Arthritis   . Asthma   . Blood transfusion    1973--with twins birth  .  Diabetes mellitus    borderline  . H/O hiatal hernia    surgery fixed  . Heart murmur    MVP  . Hypertension   . Hypothyroidism     Family History  Problem Relation Age of Onset  . Kidney disease Mother   . Vascular Disease Mother     Past Surgical History:  Procedure Laterality Date  . ABDOMINAL HYSTERECTOMY    . APPENDECTOMY    . BACK SURGERY    . bladder tack    . BREAST CYST INCISION AND DRAINAGE    . BREAST SURGERY    . BUNIONECTOMY    . CHOLECYSTECTOMY    . FRACTURE SURGERY     left wrist  . LUMBAR LAMINECTOMY  03/04/2012   Procedure: MICRODISCECTOMY LUMBAR  LAMINECTOMY;  Surgeon: Jessy Oto, MD;  Location: [facility];  Service: Orthopedics;  Laterality: N/A;  L3-4 central laminectomy  . NASAL SEPTUM SURGERY    . nissan fundoplication    . OVARIAN CYST SURGERY    . TONSILLECTOMY     Social History   Occupational History  . retired     Retired   Social History Main Topics  . Smoking status: Never Smoker  . Smokeless tobacco: Never Used  . Alcohol use No  . Drug use: No  . Sexual activity: No

## 2017-02-22 DIAGNOSIS — D485 Neoplasm of uncertain behavior of skin: Secondary | ICD-10-CM | POA: Diagnosis not present

## 2017-02-23 ENCOUNTER — Ambulatory Visit (INDEPENDENT_AMBULATORY_CARE_PROVIDER_SITE_OTHER): Payer: Medicare Other | Admitting: Neurology

## 2017-02-23 ENCOUNTER — Encounter: Payer: Self-pay | Admitting: Neurology

## 2017-02-23 VITALS — BP 149/66 | HR 75 | Ht 64.0 in | Wt 210.0 lb

## 2017-02-23 DIAGNOSIS — G4710 Hypersomnia, unspecified: Secondary | ICD-10-CM | POA: Diagnosis not present

## 2017-02-23 NOTE — Patient Instructions (Signed)
  Mrs. Becky Hobbs. [name],  This letter can be shared with your attorney.    1) uncontrolled hypertension because she forgets medication  2) severe fatigue , possible medication overhang.  3) sleep study negative for organic reasons of insomnia/ hypersomnia. She has not been known to snore or have apnea, not PLMs. .   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    Free running circadian rhythm- needs sleep hygiene. Needs to set a bed time and a rise time, take TV out of the bedroom.  Do restrict fluid intake after 8 PM, drink your daily fluid allotment before.  restrict caffeine,  Reduce  sugar.  I instructed her to set a bedtime before midnight - as well as a wakeup time after 8 hours and not to spend more than 8 hours in bed at night or in day. It would be beneficial if she would expose herself to natural daylight and establish a routine of a walk in the morning. Establish an after dinner walk.   Return for follow up with NP/ Dr. Krista Blue or DR. Jaynee Eagles.  I believe that the patient indeed suffered a concussion when she slipped on a wet floor in May 2014, and soon after had 2 blackouts spells. She has had headaches, leg pain insomnia and appears somewhat depressed ever since. I do not have any records about the patient being seen right after the fall. In general, a postconcussion syndrome includes memory deficits, cognitive problems problems with focusing, headaches insomnia and sometimes vision troubles such as blurring of vision. Usually it passes within 6 weeks to 6 months.    Asencion Partridge Tangala Wiegert MD  02/23/2017  CC: Dr. Jaynee Eagles  CC: Marton Redwood, [place], [place] [id number]
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
## 2017-03-01 ENCOUNTER — Ambulatory Visit
Admission: RE | Admit: 2017-03-01 | Discharge: 2017-03-01 | Disposition: A | Discharge status: Home / Self Care | Payer: Medicare Other | Source: Ambulatory Visit | Attending: Nurse Practitioner | Admitting: Nurse Practitioner

## 2017-03-01 DIAGNOSIS — M65851 Other synovitis and tenosynovitis, right thigh: Secondary | ICD-10-CM | POA: Diagnosis not present

## 2017-03-01 DIAGNOSIS — K7460 Unspecified cirrhosis of liver: Secondary | ICD-10-CM | POA: Diagnosis not present

## 2017-03-01 DIAGNOSIS — K7469 Other cirrhosis of liver: Secondary | ICD-10-CM

## 2017-03-03 ENCOUNTER — Other Ambulatory Visit (INDEPENDENT_AMBULATORY_CARE_PROVIDER_SITE_OTHER): Payer: Self-pay | Admitting: Specialist

## 2017-03-03 DIAGNOSIS — M7061 Trochanteric bursitis, right hip: Secondary | ICD-10-CM

## 2017-03-03 DIAGNOSIS — Z981 Arthrodesis status: Secondary | ICD-10-CM

## 2017-03-03 DIAGNOSIS — M25551 Pain in right hip: Secondary | ICD-10-CM

## 2017-03-03 NOTE — Telephone Encounter (Signed)
Tizanidine refill request 

## 2017-03-04 DIAGNOSIS — D649 Anemia, unspecified: Secondary | ICD-10-CM | POA: Diagnosis not present

## 2017-03-04 DIAGNOSIS — N183 Chronic kidney disease, stage 3 (moderate): Secondary | ICD-10-CM | POA: Diagnosis not present

## 2017-03-09 ENCOUNTER — Other Ambulatory Visit (INDEPENDENT_AMBULATORY_CARE_PROVIDER_SITE_OTHER): Payer: Self-pay | Admitting: Specialist

## 2017-03-09 DIAGNOSIS — Z981 Arthrodesis status: Secondary | ICD-10-CM

## 2017-03-09 DIAGNOSIS — M25551 Pain in right hip: Secondary | ICD-10-CM

## 2017-03-09 DIAGNOSIS — M7061 Trochanteric bursitis, right hip: Secondary | ICD-10-CM

## 2017-03-09 NOTE — Telephone Encounter (Signed)
Tizanidine refill request 

## 2017-03-10 ENCOUNTER — Telehealth (INDEPENDENT_AMBULATORY_CARE_PROVIDER_SITE_OTHER): Payer: Self-pay | Admitting: Specialist

## 2017-03-10 NOTE — Telephone Encounter (Signed)
COPY OF [facility] RECORDS 10/13/2011-10/11/2013 MAILED TO PATIENT

## 2017-03-15 ENCOUNTER — Telehealth (INDEPENDENT_AMBULATORY_CARE_PROVIDER_SITE_OTHER): Payer: Self-pay | Admitting: Specialist

## 2017-03-15 DIAGNOSIS — L570 Actinic keratosis: Secondary | ICD-10-CM | POA: Diagnosis not present

## 2017-03-15 NOTE — Telephone Encounter (Signed)
I RECEIVED VM FROM PATIENT CHECKING TO SEE IF RECORDS WERE FAXED TO ANOTHER OFFICE.  I CALLED HER BACK AND LEFT MSG ON HER VM ADVISED THAT COPY OF RECORDS MAILED TO HER AS SHE COMPLETED THE RELEASE FORM TO RELEASE THE RECORDS TO HERSELF

## 2017-04-08 ENCOUNTER — Ambulatory Visit (INDEPENDENT_AMBULATORY_CARE_PROVIDER_SITE_OTHER): Payer: Medicare Other | Admitting: Specialist

## 2017-04-08 ENCOUNTER — Encounter (INDEPENDENT_AMBULATORY_CARE_PROVIDER_SITE_OTHER): Payer: Self-pay | Admitting: Specialist

## 2017-04-08 ENCOUNTER — Ambulatory Visit (INDEPENDENT_AMBULATORY_CARE_PROVIDER_SITE_OTHER): Payer: Medicare Other

## 2017-04-08 VITALS — BP 140/68 | HR 75 | Ht 64.0 in | Wt 200.0 lb

## 2017-04-08 DIAGNOSIS — G8929 Other chronic pain: Secondary | ICD-10-CM

## 2017-04-08 DIAGNOSIS — M5441 Lumbago with sciatica, right side: Secondary | ICD-10-CM

## 2017-04-08 DIAGNOSIS — Z981 Arthrodesis status: Secondary | ICD-10-CM | POA: Diagnosis not present

## 2017-04-08 NOTE — Progress Notes (Signed)
Office Visit Note   Patient: Becky Hobbs           Date of Birth: June 20, 1946           MRN: [id number] Visit Date: 04/08/2017              Requested by: Marton Redwood, MD [address] [place], [place] [address] PCP: Marton Redwood, MD   Assessment & Plan: Visit Diagnoses:  1. Chronic bilateral low back pain with right-sided sciatica   2. Status post lumbar spinal fusion     Plan: With patient's ongoing right-sided low back pain and right hip pain we'll schedule lumbar spine MRI to rule out right L1-2 HNP.  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 Follow-up after completion to discuss results and further treatment options. Patient will bring in a right hip MRI disc frustrated review. Advised patient that we do have a copy of the report but we need the actual images. She voices understanding. All questions answered.  Follow-Up Instructions: Return in about 3 weeks (around 04/29/2017) for Review lumbar MRI.   Orders:  Orders Placed This Encounter  Procedures  . XR Lumbar Spine 2-3 Views  . MR LUMBAR SPINE W CONTRAST   No orders of the defined types were placed in this encounter.     Procedures: No procedures performed   Clinical Data: No additional findings.   Subjective: Chief Complaint  Patient presents with  . Right Hip - Follow-up    MRI Review    HPI Patient returns for recheck of right sided hip and back pain. Patient did have MRI right hip performed and report read that she has tendinosis of the hamstring tendon. She did not bring in the CD for [place] to actually review her images. Patient states that she is also been having back pain that radiates into the right buttock and right lateral hip. She's had fusion from L2-L5 by Dr. Louanne Skye and Dr Patrice Paradise.  Not complaining of any groin pain. Review of Systems No current cardiac pulmonary GI GU issues.  Objective: Vital Signs: BP 140/68 (BP Location: Left Arm, Patient Position:  Sitting)   Pulse 75   Ht 5\' 4"  (1.626 m)   Wt 200 lb (90.7 kg)   BMI 34.33 kg/m   Physical Exam  Constitutional: She is oriented to person, place, and time. No distress.  HENT:  Head: Normocephalic and atraumatic.  Eyes: EOM are normal.  Neck: Normal range of motion.  Pulmonary/Chest: No respiratory distress.  Musculoskeletal:  Gait is somewhat antalgic. Right-sided lumbar paraspinal tenderness. Negative logroll bilateral hips. Negative straight leg raise. Tender over the right hip greater trochanter. Bilateral calves and tender. Neurovascularly intact.  Neurological: She is alert and oriented to person, place, and time.  Skin: Skin is warm and dry.    Ortho Exam  Specialty Comments:  No specialty comments available.  Imaging: Xr Lumbar Spine 2-3 Views  Result Date: 04/08/2017 X-rays lumbar spine shows instrument fusion L2-L5. Hardware intact. 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 Flexion-extension views show that there or a few millimeters of L1 retrolisthesis on extension.    PMFS History: Patient Active Problem List   Diagnosis Date Noted  . History of lumbar fusion 08/03/2016    Class: Chronic  . Migraine 04/09/2016  . Acute-on-chronic kidney injury ([address]) 09/07/2015  . Hypertension 09/07/2015  . Hypothyroidism 09/07/2015  . Gout 09/07/2015  . Low back pain 09/07/2015  . Obesity (BMI 30-39.9) 07/06/2013  . Headache  04/12/2013  . Concussion 04/12/2013  . Lumbar spinal stenosis 03/04/2012    Class: Diagnosis of   Past Medical History:  Diagnosis Date  . Anxiety    panic attacks  . Arthritis   . Asthma   . Blood transfusion    1973--with twins birth  . Diabetes mellitus    borderline  . H/O hiatal hernia    surgery fixed  . Heart murmur    MVP  . Hypertension   . Hypothyroidism     Family History  Problem Relation Age of Onset  . Kidney disease Mother   . Vascular Disease Mother     Past Surgical History:  Procedure Laterality Date  . ABDOMINAL  HYSTERECTOMY    . APPENDECTOMY    . BACK SURGERY    . bladder tack    . BREAST CYST INCISION AND DRAINAGE    . BREAST SURGERY    . BUNIONECTOMY    . CHOLECYSTECTOMY    . FRACTURE SURGERY     left wrist  . LUMBAR LAMINECTOMY  03/04/2012   Procedure: MICRODISCECTOMY LUMBAR LAMINECTOMY;  Surgeon: Jessy Oto, MD;  Location: [facility];  Service: Orthopedics;  Laterality: N/A;  L3-4 central laminectomy  . NASAL SEPTUM SURGERY    . nissan fundoplication    . OVARIAN CYST SURGERY    . TONSILLECTOMY     Social History   Occupational History  . retired     Retired   Social History Main Topics  . Smoking status: Never Smoker  . Smokeless tobacco: Never Used  . Alcohol use No  . Drug use: No  . Sexual activity: No

## 2017-04-11 ENCOUNTER — Other Ambulatory Visit (INDEPENDENT_AMBULATORY_CARE_PROVIDER_SITE_OTHER): Payer: Self-pay | Admitting: Specialist

## 2017-04-11 DIAGNOSIS — G8929 Other chronic pain: Secondary | ICD-10-CM

## 2017-04-11 DIAGNOSIS — M5442 Lumbago with sciatica, left side: Principal | ICD-10-CM

## 2017-04-11 DIAGNOSIS — G44329 Chronic post-traumatic headache, not intractable: Secondary | ICD-10-CM

## 2017-04-12 NOTE — Telephone Encounter (Signed)
Gabapentin refill request 

## 2017-04-17 ENCOUNTER — Other Ambulatory Visit: Payer: Self-pay | Admitting: Neurology

## 2017-04-17 DIAGNOSIS — G43009 Migraine without aura, not intractable, without status migrainosus: Secondary | ICD-10-CM

## 2017-04-17 DIAGNOSIS — R110 Nausea: Secondary | ICD-10-CM

## 2017-04-28 DIAGNOSIS — M2548 Effusion, other site: Secondary | ICD-10-CM | POA: Diagnosis not present

## 2017-04-28 DIAGNOSIS — M47816 Spondylosis without myelopathy or radiculopathy, lumbar region: Secondary | ICD-10-CM | POA: Diagnosis not present

## 2017-04-28 DIAGNOSIS — Z981 Arthrodesis status: Secondary | ICD-10-CM | POA: Diagnosis not present

## 2017-04-28 DIAGNOSIS — M5126 Other intervertebral disc displacement, lumbar region: Secondary | ICD-10-CM | POA: Diagnosis not present

## 2017-04-30 ENCOUNTER — Ambulatory Visit (INDEPENDENT_AMBULATORY_CARE_PROVIDER_SITE_OTHER): Payer: Medicare Other | Admitting: Specialist

## 2017-04-30 ENCOUNTER — Encounter (INDEPENDENT_AMBULATORY_CARE_PROVIDER_SITE_OTHER): Payer: Self-pay | Admitting: Specialist

## 2017-04-30 VITALS — BP 102/55 | HR 64 | Ht 64.0 in | Wt 200.0 lb

## 2017-04-30 DIAGNOSIS — M4326 Fusion of spine, lumbar region: Secondary | ICD-10-CM

## 2017-04-30 DIAGNOSIS — M5136 Other intervertebral disc degeneration, lumbar region: Secondary | ICD-10-CM | POA: Diagnosis not present

## 2017-04-30 DIAGNOSIS — M5126 Other intervertebral disc displacement, lumbar region: Secondary | ICD-10-CM | POA: Diagnosis not present

## 2017-04-30 NOTE — Patient Instructions (Signed)
Plan: Avoid bending, stooping and avoid lifting weights greater than 10 lbs. Avoid prolong standing and walking. Walking a pool and using a stationary bicycle are good exercises.  Avoid frequent bending and stooping  No lifting greater than 10 lbs. May use ice or moist heat for pain. Weight loss is of benefit. Handicap license is approved.

## 2017-04-30 NOTE — Progress Notes (Signed)
Office Visit Note   Patient: Becky Hobbs           Date of Birth: 12-Jul-1946           MRN: [id number] Visit Date: 04/30/2017              Requested by: Marton Redwood, MD [address] [place], [place] [address] PCP: Marton Redwood, MD   Assessment & Plan: Visit Diagnoses:  1. Posterior herniation of lumbar disc   2. Degenerative disc disease, lumbar     Plan: Avoid bending, stooping and avoid lifting weights greater than 10 lbs. Avoid prolong standing and walking. Walking a pool and using a stationary bicycle are good exercises.  Avoid frequent bending and stooping  No lifting greater than 10 lbs. May use ice or moist heat for pain. Weight loss is of benefit. Handicap license is approved.   Follow-Up Instructions: No Follow-up on file.   Orders:  No orders of the defined types were placed in this encounter.  No orders of the defined types were placed in this encounter.     Procedures: No procedures performed   Clinical Data: No additional findings.   Subjective: Chief Complaint  Patient presents with  . Lower Back - Follow-up    LSpine-MRI Review    71 year old female with history of L2 to S1 fusion.  She is having increasing back pain and radiation into the thighs. Much of the pain is in the buttocks and lower back. There is also pain in the mid lower dorsal spine. No bowel or bladder difficulty. Creatinine is 1.9 and she was unable to have the contrast part of her recent MRI done 04/28/2017. Can't walk 100 yards. She uses cart at the grocery and leans and stoops to improve pain and allow for more standing tolerance.  Last surgery at [facility] 10/15/2014 was extension of her fusion from L4-S1 to L2-S1 with TLIFs at L2-3 and L3-4. Dr. Patrice Paradise performed the surgery.     Review of Systems  Constitutional: Positive for activity change and unexpected weight change.  HENT: Positive for rhinorrhea. Negative for ear discharge, ear pain, tinnitus, trouble swallowing  and voice change.   Eyes: Positive for discharge, redness and itching.  Respiratory: Negative.  Negative for cough, choking, shortness of breath, wheezing and stridor.   Cardiovascular: Positive for leg swelling. Negative for chest pain and palpitations.  Gastrointestinal: Positive for diarrhea. Negative for abdominal distention, abdominal pain, anal bleeding, blood in stool, nausea, rectal pain and vomiting.  Endocrine: Negative.  Negative for cold intolerance and heat intolerance.  Genitourinary: Negative.  Negative for difficulty urinating, dyspareunia, dysuria, enuresis and flank pain.  Musculoskeletal: Positive for back pain and gait problem. Negative for joint swelling, neck pain and neck stiffness.  Skin: Negative.  Negative for color change, pallor, rash and wound.  Allergic/Immunologic: Positive for environmental allergies.  Neurological: Positive for syncope, weakness, light-headedness and headaches. Negative for seizures and numbness.  Hematological: Negative.  Negative for adenopathy. Does not bruise/bleed easily.  Psychiatric/Behavioral: Negative.  Negative for agitation, behavioral problems, confusion, decreased concentration, dysphoric mood, hallucinations, self-injury, sleep disturbance and suicidal ideas. The patient is not nervous/anxious and is not hyperactive.      Objective: Vital Signs: BP (!) 102/55 (BP Location: Left Arm, Patient Position: Sitting)   Pulse 64   Ht 5\' 4"  (1.626 m)   Wt 200 lb (90.7 kg)   BMI 34.33 kg/m   Physical Exam  Constitutional: She is oriented to person,  place, and time. She appears well-developed and well-nourished. No distress.  HENT:  Head: Normocephalic and atraumatic.  Eyes: Pupils are equal, round, and reactive to light. EOM are normal. Right eye exhibits no discharge. Left eye exhibits no discharge.  Neck: Normal range of motion. Neck supple. No JVD present. No tracheal deviation present. No thyromegaly present.  Cardiovascular:  Intact distal pulses.   Pulmonary/Chest: Effort normal and breath sounds normal. She has no wheezes. She exhibits no tenderness.  Abdominal: Soft. Bowel sounds are normal. She exhibits no distension. There is no tenderness. There is no guarding.  Musculoskeletal: She exhibits tenderness. She exhibits no deformity.  Lymphadenopathy:    She has no cervical adenopathy.  Neurological: She is alert and oriented to person, place, and time. She displays normal reflexes. No cranial nerve deficit or sensory deficit. She exhibits normal muscle tone. Coordination normal.  Skin: Skin is warm and dry. No rash noted. She is not diaphoretic. No erythema. No pallor.  Psychiatric: She has a normal mood and affect. Her behavior is normal. Judgment and thought content normal.    Back Exam   Tenderness  The patient is experiencing tenderness in the lumbar.  Range of Motion  Extension:  30 abnormal  Flexion:  50 abnormal  Lateral Bend Right:  50 abnormal  Lateral Bend Left:  50 abnormal  Rotation Right: 40  Rotation Left: 40   Muscle Strength  Right Quadriceps:  5/5  Left Quadriceps:  5/5  Right Hamstrings:  5/5  Left Hamstrings:  5/5   Tests  Straight leg raise right: negative Straight leg raise left: negative  Reflexes  Patellar: Hyporeflexic Achilles: Hyporeflexic Babinski's sign: normal   Other  Toe Walk: normal Heel Walk: normal Gait: abnormal   Comments:  Weak in hip flexors 4/5 bilaterally. Adductors are mild weak 5-/5 bilaterally       Specialty Comments:  No specialty comments available.  Imaging: No results found.   PMFS History: Patient Active Problem List   Diagnosis Date Noted  . History of lumbar fusion 08/03/2016    Priority: High    Class: Chronic  . Migraine 04/09/2016  . Acute-on-chronic kidney injury ([place]) 09/07/2015  . Hypertension 09/07/2015  . Hypothyroidism 09/07/2015  . Gout 09/07/2015  . Low back pain 09/07/2015  . Obesity (BMI 30-39.9)  07/06/2013  . Headache 04/12/2013  . Concussion 04/12/2013  . Lumbar spinal stenosis 03/04/2012    Class: Diagnosis of   Past Medical History:  Diagnosis Date  . Anxiety    panic attacks  . Arthritis   . Asthma   . Blood transfusion    1973--with twins birth  . Diabetes mellitus    borderline  . H/O hiatal hernia    surgery fixed  . Heart murmur    MVP  . Hypertension   . Hypothyroidism     Family History  Problem Relation Age of Onset  . Kidney disease Mother   . Vascular Disease Mother     Past Surgical History:  Procedure Laterality Date  . ABDOMINAL HYSTERECTOMY    . APPENDECTOMY    . BACK SURGERY    . bladder tack    . BREAST CYST INCISION AND DRAINAGE    . BREAST SURGERY    . BUNIONECTOMY    . CHOLECYSTECTOMY    . FRACTURE SURGERY     left wrist  . LUMBAR LAMINECTOMY  03/04/2012   Procedure: MICRODISCECTOMY LUMBAR LAMINECTOMY;  Surgeon: Jessy Oto, MD;  Location: [facility];  Service: Orthopedics;  Laterality: N/A;  L3-4 central laminectomy  . NASAL SEPTUM SURGERY    . nissan fundoplication    . OVARIAN CYST SURGERY    . TONSILLECTOMY     Social History   Occupational History  . retired     Retired   Social History Main Topics  . Smoking status: Never Smoker  . Smokeless tobacco: Never Used  . Alcohol use No  . Drug use: No  . Sexual activity: No

## 2017-05-03 DIAGNOSIS — M545 Low back pain: Secondary | ICD-10-CM | POA: Diagnosis not present

## 2017-05-03 DIAGNOSIS — M48062 Spinal stenosis, lumbar region with neurogenic claudication: Secondary | ICD-10-CM | POA: Diagnosis not present

## 2017-05-03 DIAGNOSIS — M5106 Intervertebral disc disorders with myelopathy, lumbar region: Secondary | ICD-10-CM | POA: Diagnosis not present

## 2017-05-07 DIAGNOSIS — D649 Anemia, unspecified: Secondary | ICD-10-CM | POA: Diagnosis not present

## 2017-05-07 DIAGNOSIS — N183 Chronic kidney disease, stage 3 (moderate): Secondary | ICD-10-CM | POA: Diagnosis not present

## 2017-05-09 ENCOUNTER — Other Ambulatory Visit (INDEPENDENT_AMBULATORY_CARE_PROVIDER_SITE_OTHER): Payer: Self-pay | Admitting: Specialist

## 2017-05-09 DIAGNOSIS — M5442 Lumbago with sciatica, left side: Principal | ICD-10-CM

## 2017-05-09 DIAGNOSIS — G8929 Other chronic pain: Secondary | ICD-10-CM

## 2017-05-09 DIAGNOSIS — G44329 Chronic post-traumatic headache, not intractable: Secondary | ICD-10-CM

## 2017-05-10 NOTE — Telephone Encounter (Signed)
Gabapentin refill request 

## 2017-05-13 ENCOUNTER — Ambulatory Visit: Payer: Medicare Other | Admitting: Neurology

## 2017-06-02 DIAGNOSIS — Z6837 Body mass index (BMI) 37.0-37.9, adult: Secondary | ICD-10-CM | POA: Diagnosis not present

## 2017-06-02 DIAGNOSIS — M5106 Intervertebral disc disorders with myelopathy, lumbar region: Secondary | ICD-10-CM | POA: Diagnosis not present

## 2017-06-02 DIAGNOSIS — G894 Chronic pain syndrome: Secondary | ICD-10-CM | POA: Diagnosis not present

## 2017-06-02 DIAGNOSIS — M48062 Spinal stenosis, lumbar region with neurogenic claudication: Secondary | ICD-10-CM | POA: Diagnosis not present

## 2017-06-02 DIAGNOSIS — Z79899 Other long term (current) drug therapy: Secondary | ICD-10-CM | POA: Diagnosis not present

## 2017-06-02 DIAGNOSIS — Z79891 Long term (current) use of opiate analgesic: Secondary | ICD-10-CM | POA: Diagnosis not present

## 2017-06-07 ENCOUNTER — Other Ambulatory Visit: Payer: Self-pay | Admitting: Orthopaedic Surgery

## 2017-06-07 DIAGNOSIS — M48062 Spinal stenosis, lumbar region with neurogenic claudication: Secondary | ICD-10-CM

## 2017-06-07 DIAGNOSIS — N183 Chronic kidney disease, stage 3 (moderate): Secondary | ICD-10-CM | POA: Diagnosis not present

## 2017-06-07 DIAGNOSIS — Z6837 Body mass index (BMI) 37.0-37.9, adult: Secondary | ICD-10-CM | POA: Diagnosis not present

## 2017-06-07 DIAGNOSIS — D649 Anemia, unspecified: Secondary | ICD-10-CM | POA: Diagnosis not present

## 2017-06-07 DIAGNOSIS — M546 Pain in thoracic spine: Principal | ICD-10-CM

## 2017-06-07 DIAGNOSIS — N2581 Secondary hyperparathyroidism of renal origin: Secondary | ICD-10-CM | POA: Diagnosis not present

## 2017-06-07 DIAGNOSIS — M791 Myalgia: Secondary | ICD-10-CM | POA: Diagnosis not present

## 2017-06-07 DIAGNOSIS — M545 Low back pain: Secondary | ICD-10-CM | POA: Diagnosis not present

## 2017-06-07 DIAGNOSIS — G8929 Other chronic pain: Secondary | ICD-10-CM

## 2017-06-11 ENCOUNTER — Other Ambulatory Visit (INDEPENDENT_AMBULATORY_CARE_PROVIDER_SITE_OTHER): Payer: Self-pay | Admitting: Specialist

## 2017-06-11 DIAGNOSIS — Z01818 Encounter for other preprocedural examination: Secondary | ICD-10-CM | POA: Diagnosis not present

## 2017-06-11 DIAGNOSIS — M5489 Other dorsalgia: Secondary | ICD-10-CM | POA: Diagnosis not present

## 2017-06-11 DIAGNOSIS — M5442 Lumbago with sciatica, left side: Principal | ICD-10-CM

## 2017-06-11 DIAGNOSIS — E119 Type 2 diabetes mellitus without complications: Secondary | ICD-10-CM | POA: Diagnosis not present

## 2017-06-11 DIAGNOSIS — G44329 Chronic post-traumatic headache, not intractable: Secondary | ICD-10-CM

## 2017-06-11 DIAGNOSIS — G8929 Other chronic pain: Secondary | ICD-10-CM

## 2017-06-11 DIAGNOSIS — N184 Chronic kidney disease, stage 4 (severe): Secondary | ICD-10-CM | POA: Diagnosis not present

## 2017-06-11 DIAGNOSIS — Z6837 Body mass index (BMI) 37.0-37.9, adult: Secondary | ICD-10-CM | POA: Diagnosis not present

## 2017-06-11 NOTE — Telephone Encounter (Signed)
Gabapentin refill request 

## 2017-06-15 ENCOUNTER — Ambulatory Visit (INDEPENDENT_AMBULATORY_CARE_PROVIDER_SITE_OTHER): Payer: Medicare Other | Admitting: Neurology

## 2017-06-15 ENCOUNTER — Encounter: Payer: Self-pay | Admitting: Neurology

## 2017-06-15 DIAGNOSIS — G43709 Chronic migraine without aura, not intractable, without status migrainosus: Secondary | ICD-10-CM | POA: Diagnosis not present

## 2017-06-15 MED ORDER — NORTRIPTYLINE HCL 25 MG PO CAPS: 25.0000 mg | ORAL_CAPSULE | Freq: Every day | ORAL | 6 refills | Status: DC

## 2017-06-15 NOTE — Progress Notes (Signed)
[facility]    Provider:  Dr Jaynee Eagles Referring Provider: Marton Redwood, MD Primary Care Physician:  Marton Redwood, MD  CC: migraine  Interval history 06/15/2017: Becky Hobbs has 15 headache days a month and 8 are migrainous for over 6 months. Becky Hobbs has a lot of back pain and has surgery planned with Dr. Patrice Paradise. Becky Hobbs has tried multiple preventatives and continues to have migraines. No medication overuse. No aura. Sleep study was normal. Migraines are on the right side, thumping, pounding with light and sound sensitivity, some nausea no vomiting. Can last 8-12 hours. Can be severe. Discussed management of migraines, new class of medeication called Aimovig as well as Botox fo rmigraine, increasing Nortriptyline (cannot tolerate increase), or trying another medication such as candesartan.  Meds tried for migraines: Neurontin, Nortriptyline, Amitriptyline,  Topamax, losartan(blood pressure), zofran, imitrex, tizanidine,   Interval history 12/09/2016: Nortriptyline is working for migraines, helps her sleep and no significant side effects during the day. Becky Hobbs is chronically fatigued. We ordered an MRI brain at last appointment, Becky Hobbs did not go and we will reschedule. Just fatigued. Becky Hobbs has morning headaches. Sister says Becky Hobbs snores. Becky Hobbs wakes up tired. Becky Hobbs sleeps with water by her bed because her mouth gets so dry overnight. Epworth Sleepiness Scale 15, will refer to sleep eval  Interval history 09/09/2016: Amitriptyline helped her migraines. But it made her mouth dry. Headaches improved on alprazolam. Since Becky Hobbs stopped the Amitriptyline her migraines/headachesare worse. Becky Hobbs denies dizziness. No lightheadedness. Becky Hobbs has had vertigo in the past. Becky Hobbs has a dull headache today. No snoring at night. No often morning headaches. Discussed nortriptyline as this can have less side effects than amitriptyline as a preventative and can help with her insomnia. When Becky Hobbs takes the imitrex it helps acutely. Becky Hobbs  reports Imbalance, falls. Becky Hobbs reports worsening balance and falls, falling to the right, right sided weakness will check MRI of the brain to evaluate for strokes or other causes.   Tried and failed: Topamax, Amitriptyline.  Interval history 04/09/2016: Becky Hobbs still has migraines. Becky Hobbs is having 2-3 migraines a week. They are on the right side, thumping, pounding. Becky Hobbs takes an imitrex or an [place] and goes to bed. They are lasting hours. Becky Hobbs is not taking the topiramate, Becky Hobbs had kidney failure and stopped taking. Sh eis having a lot of insomnia. Becky Hobbs takes gabapentin and Becky Hobbs has to take [place]. Becky Hobbs has a significant problem sleeping. Migraines with some nausea. Becky Hobbs has significant insomnia, talked about this a little, good sleep habits. Discussed and insomnia counselor. We'll start amitriptyline at night for migraine prevention which can help with insomnia as well. Discussed side effects such as QT prolongation, somnolence, fatigue, risk of falls and others per patient instructions.  Tried and failed: Topamax, Amitriptyline.  Interval history: Migraines are stable. Becky Hobbs is still having them once a week. Becky Hobbs takes the triptan then they go away. For acute management, Becky Hobbs takes just one triptan. Then Becky Hobbs sleeps and it goes away. Becky Hobbs hasn't really been taking the Topamax, Becky Hobbs said it made her feel like Becky Hobbs had cognitive problems. Discussed that Topamax can do and that we could probably decrease the dose. Imitrex helps but it does not make the headache go away, Becky Hobbs still has to sleep for a while. Discussed trying some samples of Treximet or Relpax which might work better but do not use them together. Use them both just like the Imitrex, at onset of headache may repeat in 2 hours do not use more than 2 times in  24 hours or 2 days a week. Becky Hobbs also has Qysmia on her med list for weight loss, I did advise her that this drug does have Topamax and is well so to discuss that with prescribing physician so as not to  unknowingly increased dosage of one medication.  Previous History Dr. Krista Blue:  Becky Hobbs is a 71 years old right-handed African American female, referred by her primary care physician Dr. Marton Redwood for evaluation of headaches, concussion, and gait difficulty, initial visit was in July 2014.   Becky Hobbs had past medical history of hypertension, diabetes, hypothyroidism, in May 12th 2014, Becky Hobbs came downstairs, stepped into a puddle of water, fell backwards, landed on her occipital area, there was no loss of consciousness, Becky Hobbs could go on cleaning the floor, it was a leakage from 3 gallon water container, Becky Hobbs has no difficulty completing the task, afterwards, Becky Hobbs drove to her nephew's house, by then Becky Hobbs was noticed to stumbled over, couple days later, Becky Hobbs began to have frequent headaches, vertex bilateral retro-orbital area severe pounding headaches with associated light noise sensitivity, Becky Hobbs also complains of constant gait difficulty, bilateral lower extremity tremorish sensation when bearing weight,   Becky Hobbs also has difficulty sleeping, her headache are pouding, movements make it worse, with associated light noise sensitivity, Becky Hobbs denies visual loss, Becky Hobbs complains of neck pain, radiating pain to her bilateral shoulder. Becky Hobbs denies bowel and bladder incontinence   CT head without contrast in June 2014 showed no acute lesions,  MRI of cervical spine in September 2013 evaluating for gait difficulty, bilateral upper extremity paresthesia showed multilevel degenerative disc disease, most severe C5-6, C6 and 7 with mild canal stenosis, mild to moderate left more than right foraminal stenosis.   04/21/13 scan of cervical spine showing prominent spondylitic changes most prominent at C5-6 and C6-7 where there is a broad-based disc osteophyte complexes resulting in mild canal and moderate left greater than right foraminal narrowing. Overall no significant changes compared with previous MRI scan dated 07/01/2012.     UPDATE Sept 29th 2015:  Her headache overall has much improved, instead of daily headaches, Becky Hobbs is having headaches 2-3 times each week, starting at the right parietal area, pounding, with associated light noise sensitivity, nauseous, movements make it worse, Becky Hobbs preferred to lie down in dark quiet room, lasting half days,  Becky Hobbs did have a history of migraine headaches in the past  Trigger for her headaches are stress, sleep deprivation, insomnia, Becky Hobbs continued having low back pain, has been receving epidural injections by Dr. Maia Petties  Review of Systems: Patient complains of symptoms per HPI as well as the following symptoms: fatigue, hunger, weight gain. Pertinent negatives and positives per HPI. All others negative.   Social History   Social History  . Marital status: Widowed    Spouse name: N/A  . Number of children: 3  . Years of education: college   Occupational History  . retired     Retired   Social History Main Topics  . Smoking status: Never Smoker  . Smokeless tobacco: Never Used  . Alcohol use No  . Drug use: No  . Sexual activity: No   Other Topics Concern  . Not on file   Social History Narrative   Patient has a high school education.    Patient is widow.   Patient is retired.    Family History  Problem Relation Age of Onset  . Kidney disease Mother   . Vascular Disease Mother     Past Medical  History:  Diagnosis Date  . Anxiety    panic attacks  . Arthritis   . Asthma   . Blood transfusion    1973--with twins birth  . Diabetes mellitus    borderline  . H/O hiatal hernia    surgery fixed  . Heart murmur    MVP  . Hypertension   . Hypothyroidism     Past Surgical History:  Procedure Laterality Date  . ABDOMINAL HYSTERECTOMY    . APPENDECTOMY    . BACK SURGERY    . bladder tack    . BREAST CYST INCISION AND DRAINAGE    . BREAST SURGERY    . BUNIONECTOMY    . CHOLECYSTECTOMY    . FRACTURE SURGERY     left wrist  . LUMBAR  LAMINECTOMY  03/04/2012   Procedure: MICRODISCECTOMY LUMBAR LAMINECTOMY;  Surgeon: Jessy Oto, MD;  Location: [facility];  Service: Orthopedics;  Laterality: N/A;  L3-4 central laminectomy  . NASAL SEPTUM SURGERY    . nissan fundoplication    . OVARIAN CYST SURGERY    . TONSILLECTOMY      Current Outpatient Prescriptions  Medication Sig Dispense Refill  . albuterol (PROVENTIL HFA;VENTOLIN HFA) 108 (90 BASE) MCG/ACT inhaler Inhale 2 puffs into the lungs every 6 (six) hours as needed for wheezing or shortness of breath. 1 Inhaler 6  . allopurinol (ZYLOPRIM) 100 MG tablet Take 100 mg by mouth daily.    Marland Kitchen ALPRAZolam (XANAX) 0.5 MG tablet Take 0.5 mg by mouth daily as needed for anxiety.     Marland Kitchen aspirin EC 81 MG tablet Take 81 mg by mouth daily.    Marland Kitchen bismuth subsalicylate (PEPTO BISMOL) 262 MG/15ML suspension Take 30 mLs by mouth every 6 (six) hours as needed for indigestion or diarrhea or loose stools.    . cetirizine (ZYRTEC) 10 MG tablet Take 10 mg by mouth daily.    . Cholecalciferol 2000 UNITS CAPS Take 4,000 Units by mouth daily.    Marland Kitchen EPINEPHrine (EPIPEN 2-PAK) 0.3 mg/0.3 mL IJ SOAJ injection Inject 0.3 mLs (0.3 mg total) into the skin once. (Patient taking differently: Inject 0.3 mg into the skin daily as needed (allergic reaction). ) 2 Device 4  . fluticasone (FLONASE) 50 MCG/ACT nasal spray INT 2 SPRAYS INTO EACH NOSTRIL QHS  5  . FOLIC GLOV-F6-E33-I PO Take by mouth daily.    Marland Kitchen gabapentin (NEURONTIN) 300 MG capsule TAKE 1 CAPSULE BY MOUTH EVERY MORNING AND TAKE 2 CAPSULES EVERY NIGHT AT BEDTIME 90 capsule 0  . levothyroxine (SYNTHROID, LEVOTHROID) 88 MCG tablet Take 88 mcg by mouth daily before breakfast.     . losartan (COZAAR) 100 MG tablet Take 100 mg by mouth daily.    . metoprolol succinate (TOPROL-XL) 100 MG 24 hr tablet Take 100 mg by mouth daily. Take with or immediately following a meal.    . Multiple Vitamin (MULITIVITAMIN WITH MINERALS) TABS Take 1 tablet by mouth daily.    .  nortriptyline (PAMELOR) 10 MG capsule Take 1 capsule (10 mg total) by mouth at bedtime. 90 capsule 4  . ondansetron (ZOFRAN) 4 MG tablet Take 1 tablet (4 mg total) by mouth every 8 (eight) hours as needed for nausea. 30 tablet 6  . ondansetron (ZOFRAN-ODT) 4 MG disintegrating tablet Take 1 tablet (4 mg total) by mouth every 8 (eight) hours as needed for nausea. 60 tablet 12  . oxyCODONE-acetaminophen (PERCOCET/ROXICET) 5-325 MG per tablet Take 1 tablet by mouth every 6 (six) hours as  needed for moderate pain.   0  . potassium chloride SA (K-DUR,KLOR-CON) 20 MEQ tablet Take 20 mEq by mouth daily as needed (fluid). Takes with torsemide.    . Probiotic Product (PROBIOTIC PO) Take 1 capsule by mouth daily.    Marland Kitchen rOPINIRole (REQUIP) 0.5 MG tablet Take 0.5 mg by mouth at bedtime.    . rosuvastatin (CRESTOR) 10 MG tablet     . SUMAtriptan (IMITREX) 100 MG tablet TAKE 1 TABLET(100 MG) BY MOUTH 1 TIME AS NEEDED FOR MIGRAINE. MAY REPEAT IN 2 HOURS IF HEADACHE PERSISTS OR RECURS 12 tablet 11  . tiZANidine (ZANAFLEX) 2 MG tablet TAKE 1 TO 2 TABLETS BY MOUTH EVERY 6 HOURS AS NEEDED FOR SPASM 771 tablet 3  . tiZANidine (ZANAFLEX) 2 MG tablet TAKE 1 TO 2 TABLETS BY MOUTH EVERY 6 HOURS AS NEEDED FOR SPASM 60 tablet 0  . torsemide (DEMADEX) 20 MG tablet Take 1 tablet (20 mg total) by mouth daily as needed (fluid). For edema. 30 tablet 0  . zolpidem (AMBIEN) 10 MG tablet Take 10 mg by mouth at bedtime as needed for sleep.      No current facility-administered medications for this visit.     Allergies as of 06/15/2017 - Review Complete 06/15/2017  Allergen Reaction Noted  . Other Anaphylaxis 07/10/2014  . Erythromycin Hives and Itching 05/12/2012  . Hydrocodone Itching 03/01/2012  . Morphine and related Hives and Itching 03/04/2012  . Penicillins Hives 03/01/2012  . Tramadol hcl Itching 01/04/2014  . Hydrocodone-acetaminophen Itching 07/10/2014    Vitals: BP (!) 170/82   Pulse 68   Ht 5\' 4"  (1.626 m)    Wt 210 lb (95.3 kg)   BMI 36.05 kg/m  Last Weight:  Wt Readings from Last 1 Encounters:  06/15/17 210 lb (95.3 kg)   Last Height:   Ht Readings from Last 1 Encounters:  06/15/17 5\' 4"  (1.626 m)        Exam: Gen: NAD, conversant, well nourised, obese, well groomed  CV: RRR, no MRG. No Carotid Bruits. No peripheral edema, warm, nontender Eyes: Conjunctivae clear without exudates or hemorrhage  Neuro: Detailed Neurologic Exam  Speech:  Speech is normal; fluent and spontaneous with normal comprehension.  Cognition:  The patient is oriented to person, place, and time;  Cranial Nerves:  The pupils are equal, round, and reactive to light. Visual fields are full to finger confrontation. Extraocular movements are intact. Trigeminal sensation is intact and the muscles of mastication are normal. The face is symmetric. The palate elevates in the midline. Hearing intact. Voice is normal. Shoulder shrug is normal. The tongue has normal motion without fasciculations.   Coordination:  Normal finger to nose and heel to shin.   Gait:  imbalance with heel to toe.   Motor Observation:  No asymmetry, no atrophy, and no involuntary movements noted. Tone:  Normal muscle tone.   Posture:  Posture is normal. normal erect   Strength:  Strength is V/V in the upper and lower limbs.    Sensation:intact , Romberg negative   Reflex Exam:  DTR's:  Deep tendon reflexes in the upper and lower extremities are brisk but symmetric bilaterally.  Toes:  The toes are downgoing bilaterally.  Clonus:  Clonus is absent.   Assessment/Plan: 71 year old female here for follow up of chronic migraines without aura. Failed multiple preventative medications.  Sleep evaluation was negative for OSA/normal  As far as your medications are concerned, I would like to suggest: Migraines: Nortriptyline every  night before bed.  Amitriptyline helped but had too many side effects.  Imitrex at the onset of migraine. Can repeat in 2 hours, no more than 2x in a day or 9 in a month Zofran for nausea Recommend Botox for migraines or Aimovig Follow-up 6 months Obesity: [facility] referral  Orders Placed This Encounter  Procedures  . Ambulatory referral to [place], MD  [facility] [address] [place], [place] [address]  Phone [phone] Fax [phone]  A total of 30 minutes was spent face-to-face with this patient. Over half this time was spent on counseling patient on the chronic migraine diagnosis and different diagnostic and therapeutic options available.

## 2017-06-15 NOTE — Patient Instructions (Signed)
Consider Botox for migraines or Aimovig for headches

## 2017-06-16 DIAGNOSIS — G43709 Chronic migraine without aura, not intractable, without status migrainosus: Secondary | ICD-10-CM | POA: Insufficient documentation

## 2017-06-18 ENCOUNTER — Ambulatory Visit (INDEPENDENT_AMBULATORY_CARE_PROVIDER_SITE_OTHER): Payer: Medicare Other | Admitting: Specialist

## 2017-06-21 ENCOUNTER — Other Ambulatory Visit: Payer: Medicare Other

## 2017-06-22 ENCOUNTER — Ambulatory Visit
Admission: RE | Admit: 2017-06-22 | Discharge: 2017-06-22 | Disposition: A | Discharge status: Home / Self Care | Payer: Self-pay | Source: Ambulatory Visit | Attending: Orthopaedic Surgery | Admitting: Orthopaedic Surgery

## 2017-06-22 ENCOUNTER — Ambulatory Visit
Admission: RE | Admit: 2017-06-22 | Discharge: 2017-06-22 | Disposition: A | Discharge status: Home / Self Care | Payer: Medicare Other | Source: Ambulatory Visit | Attending: Orthopaedic Surgery | Admitting: Orthopaedic Surgery

## 2017-06-22 ENCOUNTER — Other Ambulatory Visit: Payer: Self-pay | Admitting: Orthopaedic Surgery

## 2017-06-22 VITALS — BP 176/75 | HR 84

## 2017-06-22 DIAGNOSIS — Z981 Arthrodesis status: Secondary | ICD-10-CM

## 2017-06-22 DIAGNOSIS — M5124 Other intervertebral disc displacement, thoracic region: Secondary | ICD-10-CM | POA: Diagnosis not present

## 2017-06-22 DIAGNOSIS — M546 Pain in thoracic spine: Secondary | ICD-10-CM

## 2017-06-22 DIAGNOSIS — G8929 Other chronic pain: Secondary | ICD-10-CM

## 2017-06-22 DIAGNOSIS — M48062 Spinal stenosis, lumbar region with neurogenic claudication: Secondary | ICD-10-CM

## 2017-06-22 DIAGNOSIS — M5442 Lumbago with sciatica, left side: Principal | ICD-10-CM

## 2017-06-22 MED ORDER — ONDANSETRON HCL 4 MG/2ML IJ SOLN
4.0000 mg | Freq: Once | INTRAMUSCULAR | Status: AC
  Administered 2017-06-22: 4 mg via INTRAMUSCULAR

## 2017-06-22 MED ORDER — DIAZEPAM 5 MG PO TABS
5.0000 mg | ORAL_TABLET | Freq: Once | ORAL | Status: AC
  Administered 2017-06-22: 5 mg via ORAL

## 2017-06-22 MED ORDER — IOPAMIDOL (ISOVUE-M 300) INJECTION 61%
10.0000 mL | Freq: Once | INTRAMUSCULAR | Status: AC | PRN
  Administered 2017-06-22: 10 mL via INTRATHECAL

## 2017-06-22 MED ORDER — MEPERIDINE HCL 100 MG/ML IJ SOLN
75.0000 mg | Freq: Once | INTRAMUSCULAR | Status: AC
  Administered 2017-06-22: 75 mg via INTRAMUSCULAR

## 2017-06-22 NOTE — Discharge Instructions (Signed)
Myelogram Discharge Instructions  1. Go home and rest quietly for the next 24 hours.  It is important to lie flat for the next 24 hours.  Get up only to go to the restroom.  You may lie in the bed or on a couch on your back, your stomach, your left side or your right side.  You may have one pillow under your head.  You may have pillows between your knees while you are on your side or under your knees while you are on your back.  2. DO NOT drive today.  Recline the seat as far back as it will go, while still wearing your seat belt, on the way home.  3. You may get up to go to the bathroom as needed.  You may sit up for 10 minutes to eat.  You may resume your normal diet and medications unless otherwise indicated.  Drink lots of extra fluids today and tomorrow.  4. The incidence of headache, nausea, or vomiting is about 5% (one in 20 patients).  If you develop a headache, lie flat and drink plenty of fluids until the headache goes away.  Caffeinated beverages may be helpful.  If you develop severe nausea and vomiting or a headache that does not go away with flat bed rest, call [phone].  5. You may resume normal activities after your 24 hours of bed rest is over; however, do not exert yourself strongly or do any heavy lifting tomorrow. If when you get up you have a headache when standing, go back to bed and force fluids for another 24 hours.  6. Call your physician for a follow-up appointment.  The results of your myelogram will be sent directly to your physician by the following day.  7. If you have any questions or if complications develop after you arrive home, please call [phone].  Discharge instructions have been explained to the patient.  The patient, or the person responsible for the patient, fully understands these instructions.       May resume Imitrex and Pamelor on Sept. 12, 2018, after 1:00 pm.

## 2017-06-22 NOTE — Progress Notes (Signed)
Pt states she has been off Imitrex and Pamelor since last Thursday.

## 2017-06-29 ENCOUNTER — Telehealth: Payer: Self-pay | Admitting: Neurology

## 2017-06-29 ENCOUNTER — Other Ambulatory Visit (INDEPENDENT_AMBULATORY_CARE_PROVIDER_SITE_OTHER): Payer: Self-pay | Admitting: Specialist

## 2017-06-29 DIAGNOSIS — Z981 Arthrodesis status: Secondary | ICD-10-CM

## 2017-06-29 DIAGNOSIS — M7061 Trochanteric bursitis, right hip: Secondary | ICD-10-CM

## 2017-06-29 DIAGNOSIS — M25551 Pain in right hip: Secondary | ICD-10-CM

## 2017-06-29 NOTE — Telephone Encounter (Signed)
Pt states that her surgeon(Dr [facility]) has called her to inform that they are needing clearance from Dr Jaynee Eagles before they can schedule pt's surgery.  Pt is asking that Dr Towanda Malkin office be contacted for clearance, the # for Dr Patrice Paradise is [phone]

## 2017-06-30 NOTE — Telephone Encounter (Signed)
LMVM for Dr. Towanda Malkin office to fax over clearance form to MR attention sandy RN.

## 2017-06-30 NOTE — Telephone Encounter (Signed)
Tizanidine Refill request

## 2017-07-01 ENCOUNTER — Ambulatory Visit (INDEPENDENT_AMBULATORY_CARE_PROVIDER_SITE_OTHER): Payer: Medicare Other | Admitting: Neurology

## 2017-07-01 ENCOUNTER — Telehealth: Payer: Self-pay | Admitting: Neurology

## 2017-07-01 ENCOUNTER — Encounter: Payer: Self-pay | Admitting: Neurology

## 2017-07-01 VITALS — BP 153/69 | HR 66 | Wt 210.0 lb

## 2017-07-01 DIAGNOSIS — G43709 Chronic migraine without aura, not intractable, without status migrainosus: Secondary | ICD-10-CM | POA: Diagnosis not present

## 2017-07-01 NOTE — Progress Notes (Signed)
Botox 100 units, 2 vials. Expires 01/2020 lot number is [id number]

## 2017-07-01 NOTE — Telephone Encounter (Signed)
I am fine clearing her without restrictions, can u fax it back please?

## 2017-07-01 NOTE — Telephone Encounter (Signed)
Received and placed in Dr. Ferdinand Lango (to sign box)

## 2017-07-01 NOTE — Telephone Encounter (Signed)
Per Dr. Jaynee Eagles, pt needs botox in 3 mos

## 2017-07-01 NOTE — Telephone Encounter (Signed)
Signed.  Fax confirmation received [facility].  [phone].

## 2017-07-04 NOTE — Progress Notes (Signed)

## 2017-07-05 NOTE — Telephone Encounter (Signed)
I called and scheduled the patient.  °

## 2017-07-06 ENCOUNTER — Other Ambulatory Visit (INDEPENDENT_AMBULATORY_CARE_PROVIDER_SITE_OTHER): Payer: Self-pay | Admitting: Specialist

## 2017-07-06 DIAGNOSIS — M7061 Trochanteric bursitis, right hip: Secondary | ICD-10-CM

## 2017-07-06 DIAGNOSIS — Z981 Arthrodesis status: Secondary | ICD-10-CM

## 2017-07-06 DIAGNOSIS — M25551 Pain in right hip: Secondary | ICD-10-CM

## 2017-07-06 NOTE — Telephone Encounter (Signed)
Tizanidine refill request 

## 2017-07-12 ENCOUNTER — Other Ambulatory Visit (INDEPENDENT_AMBULATORY_CARE_PROVIDER_SITE_OTHER): Payer: Self-pay | Admitting: Specialist

## 2017-07-12 DIAGNOSIS — M7061 Trochanteric bursitis, right hip: Secondary | ICD-10-CM

## 2017-07-12 DIAGNOSIS — Z981 Arthrodesis status: Secondary | ICD-10-CM

## 2017-07-12 DIAGNOSIS — G894 Chronic pain syndrome: Secondary | ICD-10-CM | POA: Diagnosis not present

## 2017-07-12 DIAGNOSIS — M25551 Pain in right hip: Secondary | ICD-10-CM

## 2017-07-12 DIAGNOSIS — M48062 Spinal stenosis, lumbar region with neurogenic claudication: Secondary | ICD-10-CM | POA: Diagnosis not present

## 2017-07-12 NOTE — Telephone Encounter (Signed)
Tizanidine Refill request

## 2017-07-16 ENCOUNTER — Other Ambulatory Visit (INDEPENDENT_AMBULATORY_CARE_PROVIDER_SITE_OTHER): Payer: Self-pay | Admitting: Specialist

## 2017-07-16 DIAGNOSIS — I10 Essential (primary) hypertension: Secondary | ICD-10-CM | POA: Diagnosis not present

## 2017-07-16 DIAGNOSIS — M25551 Pain in right hip: Secondary | ICD-10-CM

## 2017-07-16 DIAGNOSIS — M7061 Trochanteric bursitis, right hip: Secondary | ICD-10-CM

## 2017-07-16 DIAGNOSIS — M1000 Idiopathic gout, unspecified site: Secondary | ICD-10-CM | POA: Diagnosis not present

## 2017-07-16 DIAGNOSIS — E119 Type 2 diabetes mellitus without complications: Secondary | ICD-10-CM | POA: Diagnosis not present

## 2017-07-16 DIAGNOSIS — Z981 Arthrodesis status: Secondary | ICD-10-CM

## 2017-07-16 DIAGNOSIS — E038 Other specified hypothyroidism: Secondary | ICD-10-CM | POA: Diagnosis not present

## 2017-07-16 NOTE — Telephone Encounter (Signed)
Tizanidine refill request 

## 2017-07-21 ENCOUNTER — Other Ambulatory Visit (INDEPENDENT_AMBULATORY_CARE_PROVIDER_SITE_OTHER): Payer: Self-pay | Admitting: Specialist

## 2017-07-21 DIAGNOSIS — B1920 Unspecified viral hepatitis C without hepatic coma: Secondary | ICD-10-CM | POA: Diagnosis not present

## 2017-07-21 DIAGNOSIS — N183 Chronic kidney disease, stage 3 (moderate): Secondary | ICD-10-CM | POA: Diagnosis not present

## 2017-07-21 DIAGNOSIS — M5442 Lumbago with sciatica, left side: Principal | ICD-10-CM

## 2017-07-21 DIAGNOSIS — F0781 Postconcussional syndrome: Secondary | ICD-10-CM | POA: Diagnosis not present

## 2017-07-21 DIAGNOSIS — G44329 Chronic post-traumatic headache, not intractable: Secondary | ICD-10-CM

## 2017-07-21 DIAGNOSIS — N289 Disorder of kidney and ureter, unspecified: Secondary | ICD-10-CM | POA: Diagnosis not present

## 2017-07-21 DIAGNOSIS — E669 Obesity, unspecified: Secondary | ICD-10-CM | POA: Diagnosis not present

## 2017-07-21 DIAGNOSIS — E1129 Type 2 diabetes mellitus with other diabetic kidney complication: Secondary | ICD-10-CM | POA: Diagnosis not present

## 2017-07-21 DIAGNOSIS — M5441 Lumbago with sciatica, right side: Secondary | ICD-10-CM | POA: Diagnosis not present

## 2017-07-21 DIAGNOSIS — E611 Iron deficiency: Secondary | ICD-10-CM | POA: Diagnosis not present

## 2017-07-21 DIAGNOSIS — I10 Essential (primary) hypertension: Secondary | ICD-10-CM | POA: Diagnosis not present

## 2017-07-21 DIAGNOSIS — G8929 Other chronic pain: Secondary | ICD-10-CM

## 2017-07-21 DIAGNOSIS — N2581 Secondary hyperparathyroidism of renal origin: Secondary | ICD-10-CM | POA: Diagnosis not present

## 2017-07-21 NOTE — Telephone Encounter (Signed)
Gabapentin refill request 

## 2017-07-23 DIAGNOSIS — E038 Other specified hypothyroidism: Secondary | ICD-10-CM | POA: Diagnosis not present

## 2017-07-23 DIAGNOSIS — Z6837 Body mass index (BMI) 37.0-37.9, adult: Secondary | ICD-10-CM | POA: Diagnosis not present

## 2017-07-23 DIAGNOSIS — E119 Type 2 diabetes mellitus without complications: Secondary | ICD-10-CM | POA: Diagnosis not present

## 2017-07-23 DIAGNOSIS — N390 Urinary tract infection, site not specified: Secondary | ICD-10-CM | POA: Diagnosis not present

## 2017-07-23 DIAGNOSIS — Z0000 Encounter for general adult medical examination without abnormal findings: Secondary | ICD-10-CM | POA: Diagnosis not present

## 2017-07-23 DIAGNOSIS — N2889 Other specified disorders of kidney and ureter: Secondary | ICD-10-CM | POA: Diagnosis not present

## 2017-07-23 DIAGNOSIS — Z23 Encounter for immunization: Secondary | ICD-10-CM | POA: Diagnosis not present

## 2017-07-23 DIAGNOSIS — E1129 Type 2 diabetes mellitus with other diabetic kidney complication: Secondary | ICD-10-CM | POA: Diagnosis not present

## 2017-07-23 DIAGNOSIS — Z1389 Encounter for screening for other disorder: Secondary | ICD-10-CM | POA: Diagnosis not present

## 2017-07-23 DIAGNOSIS — N184 Chronic kidney disease, stage 4 (severe): Secondary | ICD-10-CM | POA: Diagnosis not present

## 2017-07-23 DIAGNOSIS — I10 Essential (primary) hypertension: Secondary | ICD-10-CM | POA: Diagnosis not present

## 2017-07-23 DIAGNOSIS — E7849 Other hyperlipidemia: Secondary | ICD-10-CM | POA: Diagnosis not present

## 2017-07-26 DIAGNOSIS — M4326 Fusion of spine, lumbar region: Secondary | ICD-10-CM | POA: Diagnosis not present

## 2017-07-26 DIAGNOSIS — I10 Essential (primary) hypertension: Secondary | ICD-10-CM | POA: Diagnosis present

## 2017-07-26 DIAGNOSIS — R338 Other retention of urine: Secondary | ICD-10-CM | POA: Diagnosis not present

## 2017-07-26 DIAGNOSIS — G959 Disease of spinal cord, unspecified: Secondary | ICD-10-CM | POA: Diagnosis not present

## 2017-07-26 DIAGNOSIS — M4802 Spinal stenosis, cervical region: Secondary | ICD-10-CM | POA: Diagnosis not present

## 2017-07-26 DIAGNOSIS — Z1212 Encounter for screening for malignant neoplasm of rectum: Secondary | ICD-10-CM | POA: Diagnosis not present

## 2017-07-26 DIAGNOSIS — M48062 Spinal stenosis, lumbar region with neurogenic claudication: Secondary | ICD-10-CM | POA: Diagnosis not present

## 2017-07-26 DIAGNOSIS — Z7982 Long term (current) use of aspirin: Secondary | ICD-10-CM | POA: Diagnosis not present

## 2017-07-26 DIAGNOSIS — T84296A Other mechanical complication of internal fixation device of vertebrae, initial encounter: Secondary | ICD-10-CM | POA: Diagnosis not present

## 2017-07-26 DIAGNOSIS — N390 Urinary tract infection, site not specified: Secondary | ICD-10-CM | POA: Diagnosis not present

## 2017-07-26 DIAGNOSIS — N189 Chronic kidney disease, unspecified: Secondary | ICD-10-CM | POA: Diagnosis not present

## 2017-07-26 DIAGNOSIS — Z79899 Other long term (current) drug therapy: Secondary | ICD-10-CM | POA: Diagnosis not present

## 2017-07-26 DIAGNOSIS — I1310 Hypertensive heart and chronic kidney disease without heart failure, with stage 1 through stage 4 chronic kidney disease, or unspecified chronic kidney disease: Secondary | ICD-10-CM | POA: Diagnosis not present

## 2017-07-26 DIAGNOSIS — Z01812 Encounter for preprocedural laboratory examination: Secondary | ICD-10-CM | POA: Diagnosis not present

## 2017-07-26 DIAGNOSIS — M961 Postlaminectomy syndrome, not elsewhere classified: Secondary | ICD-10-CM | POA: Diagnosis not present

## 2017-07-26 DIAGNOSIS — M960 Pseudarthrosis after fusion or arthrodesis: Secondary | ICD-10-CM | POA: Diagnosis present

## 2017-07-26 DIAGNOSIS — M109 Gout, unspecified: Secondary | ICD-10-CM | POA: Diagnosis present

## 2017-07-26 DIAGNOSIS — Z880 Allergy status to penicillin: Secondary | ICD-10-CM | POA: Diagnosis not present

## 2017-07-26 DIAGNOSIS — Z87891 Personal history of nicotine dependence: Secondary | ICD-10-CM | POA: Diagnosis not present

## 2017-07-26 DIAGNOSIS — Z7409 Other reduced mobility: Secondary | ICD-10-CM | POA: Diagnosis not present

## 2017-07-26 DIAGNOSIS — E785 Hyperlipidemia, unspecified: Secondary | ICD-10-CM | POA: Diagnosis not present

## 2017-07-26 DIAGNOSIS — M5106 Intervertebral disc disorders with myelopathy, lumbar region: Secondary | ICD-10-CM | POA: Diagnosis not present

## 2017-07-26 DIAGNOSIS — M4316 Spondylolisthesis, lumbar region: Secondary | ICD-10-CM | POA: Diagnosis not present

## 2017-07-26 DIAGNOSIS — M48061 Spinal stenosis, lumbar region without neurogenic claudication: Secondary | ICD-10-CM | POA: Diagnosis not present

## 2017-07-26 DIAGNOSIS — E039 Hypothyroidism, unspecified: Secondary | ICD-10-CM | POA: Diagnosis not present

## 2017-07-26 HISTORY — PX: THORACIC FUSION: SHX1062

## 2017-07-26 HISTORY — PX: LUMBAR FUSION: SHX111

## 2017-07-27 ENCOUNTER — Other Ambulatory Visit: Payer: Self-pay | Admitting: Internal Medicine

## 2017-07-27 DIAGNOSIS — N2889 Other specified disorders of kidney and ureter: Secondary | ICD-10-CM

## 2017-07-28 DIAGNOSIS — K5900 Constipation, unspecified: Secondary | ICD-10-CM | POA: Diagnosis not present

## 2017-07-28 DIAGNOSIS — B961 Klebsiella pneumoniae [K. pneumoniae] as the cause of diseases classified elsewhere: Secondary | ICD-10-CM | POA: Diagnosis present

## 2017-07-28 DIAGNOSIS — Z4789 Encounter for other orthopedic aftercare: Secondary | ICD-10-CM | POA: Diagnosis not present

## 2017-07-28 DIAGNOSIS — E039 Hypothyroidism, unspecified: Secondary | ICD-10-CM | POA: Diagnosis not present

## 2017-07-28 DIAGNOSIS — I1310 Hypertensive heart and chronic kidney disease without heart failure, with stage 1 through stage 4 chronic kidney disease, or unspecified chronic kidney disease: Secondary | ICD-10-CM | POA: Diagnosis not present

## 2017-07-28 DIAGNOSIS — Z7409 Other reduced mobility: Secondary | ICD-10-CM | POA: Diagnosis not present

## 2017-07-28 DIAGNOSIS — R5383 Other fatigue: Secondary | ICD-10-CM | POA: Diagnosis not present

## 2017-07-28 DIAGNOSIS — E785 Hyperlipidemia, unspecified: Secondary | ICD-10-CM | POA: Diagnosis not present

## 2017-07-28 DIAGNOSIS — K6389 Other specified diseases of intestine: Secondary | ICD-10-CM | POA: Diagnosis not present

## 2017-07-28 DIAGNOSIS — N189 Chronic kidney disease, unspecified: Secondary | ICD-10-CM | POA: Diagnosis not present

## 2017-07-28 DIAGNOSIS — M48061 Spinal stenosis, lumbar region without neurogenic claudication: Secondary | ICD-10-CM | POA: Diagnosis present

## 2017-07-28 DIAGNOSIS — D72829 Elevated white blood cell count, unspecified: Secondary | ICD-10-CM | POA: Diagnosis not present

## 2017-07-28 DIAGNOSIS — M961 Postlaminectomy syndrome, not elsewhere classified: Secondary | ICD-10-CM | POA: Diagnosis not present

## 2017-07-28 DIAGNOSIS — R338 Other retention of urine: Secondary | ICD-10-CM | POA: Diagnosis not present

## 2017-07-28 DIAGNOSIS — G959 Disease of spinal cord, unspecified: Secondary | ICD-10-CM | POA: Diagnosis not present

## 2017-07-28 DIAGNOSIS — I959 Hypotension, unspecified: Secondary | ICD-10-CM | POA: Diagnosis not present

## 2017-07-28 DIAGNOSIS — E871 Hypo-osmolality and hyponatremia: Secondary | ICD-10-CM | POA: Diagnosis not present

## 2017-07-28 DIAGNOSIS — K567 Ileus, unspecified: Secondary | ICD-10-CM | POA: Diagnosis not present

## 2017-07-28 DIAGNOSIS — M4316 Spondylolisthesis, lumbar region: Secondary | ICD-10-CM | POA: Diagnosis present

## 2017-07-28 DIAGNOSIS — N390 Urinary tract infection, site not specified: Secondary | ICD-10-CM | POA: Diagnosis not present

## 2017-07-28 DIAGNOSIS — R140 Abdominal distension (gaseous): Secondary | ICD-10-CM | POA: Diagnosis not present

## 2017-07-28 DIAGNOSIS — R339 Retention of urine, unspecified: Secondary | ICD-10-CM | POA: Diagnosis not present

## 2017-07-28 DIAGNOSIS — Z981 Arthrodesis status: Secondary | ICD-10-CM | POA: Diagnosis not present

## 2017-07-28 DIAGNOSIS — J9811 Atelectasis: Secondary | ICD-10-CM | POA: Diagnosis not present

## 2017-07-28 DIAGNOSIS — N179 Acute kidney failure, unspecified: Secondary | ICD-10-CM | POA: Diagnosis not present

## 2017-07-28 DIAGNOSIS — Z87891 Personal history of nicotine dependence: Secondary | ICD-10-CM | POA: Diagnosis not present

## 2017-07-28 DIAGNOSIS — J45909 Unspecified asthma, uncomplicated: Secondary | ICD-10-CM | POA: Diagnosis present

## 2017-07-28 DIAGNOSIS — I129 Hypertensive chronic kidney disease with stage 1 through stage 4 chronic kidney disease, or unspecified chronic kidney disease: Secondary | ICD-10-CM | POA: Diagnosis not present

## 2017-07-30 DIAGNOSIS — K6389 Other specified diseases of intestine: Secondary | ICD-10-CM | POA: Diagnosis not present

## 2017-07-30 DIAGNOSIS — R5383 Other fatigue: Secondary | ICD-10-CM | POA: Diagnosis not present

## 2017-08-01 DIAGNOSIS — R140 Abdominal distension (gaseous): Secondary | ICD-10-CM | POA: Diagnosis not present

## 2017-08-01 DIAGNOSIS — J9811 Atelectasis: Secondary | ICD-10-CM | POA: Diagnosis not present

## 2017-08-01 DIAGNOSIS — K6389 Other specified diseases of intestine: Secondary | ICD-10-CM | POA: Diagnosis not present

## 2017-08-02 DIAGNOSIS — Z87440 Personal history of urinary (tract) infections: Secondary | ICD-10-CM | POA: Diagnosis not present

## 2017-08-02 DIAGNOSIS — R339 Retention of urine, unspecified: Secondary | ICD-10-CM | POA: Diagnosis present

## 2017-08-02 DIAGNOSIS — E785 Hyperlipidemia, unspecified: Secondary | ICD-10-CM | POA: Diagnosis not present

## 2017-08-02 DIAGNOSIS — K567 Ileus, unspecified: Secondary | ICD-10-CM | POA: Diagnosis not present

## 2017-08-02 DIAGNOSIS — R0602 Shortness of breath: Secondary | ICD-10-CM | POA: Diagnosis not present

## 2017-08-02 DIAGNOSIS — G8918 Other acute postprocedural pain: Secondary | ICD-10-CM | POA: Diagnosis not present

## 2017-08-02 DIAGNOSIS — R0682 Tachypnea, not elsewhere classified: Secondary | ICD-10-CM | POA: Diagnosis not present

## 2017-08-02 DIAGNOSIS — R933 Abnormal findings on diagnostic imaging of other parts of digestive tract: Secondary | ICD-10-CM | POA: Diagnosis not present

## 2017-08-02 DIAGNOSIS — I129 Hypertensive chronic kidney disease with stage 1 through stage 4 chronic kidney disease, or unspecified chronic kidney disease: Secondary | ICD-10-CM | POA: Diagnosis not present

## 2017-08-02 DIAGNOSIS — K56609 Unspecified intestinal obstruction, unspecified as to partial versus complete obstruction: Secondary | ICD-10-CM | POA: Diagnosis not present

## 2017-08-02 DIAGNOSIS — R0600 Dyspnea, unspecified: Secondary | ICD-10-CM | POA: Diagnosis not present

## 2017-08-02 DIAGNOSIS — E039 Hypothyroidism, unspecified: Secondary | ICD-10-CM | POA: Diagnosis not present

## 2017-08-02 DIAGNOSIS — Z9889 Other specified postprocedural states: Secondary | ICD-10-CM | POA: Diagnosis not present

## 2017-08-02 DIAGNOSIS — Z4789 Encounter for other orthopedic aftercare: Secondary | ICD-10-CM | POA: Diagnosis not present

## 2017-08-02 DIAGNOSIS — D62 Acute posthemorrhagic anemia: Secondary | ICD-10-CM | POA: Diagnosis not present

## 2017-08-02 DIAGNOSIS — Z90710 Acquired absence of both cervix and uterus: Secondary | ICD-10-CM | POA: Diagnosis not present

## 2017-08-02 DIAGNOSIS — B961 Klebsiella pneumoniae [K. pneumoniae] as the cause of diseases classified elsewhere: Secondary | ICD-10-CM | POA: Diagnosis not present

## 2017-08-02 DIAGNOSIS — Z87891 Personal history of nicotine dependence: Secondary | ICD-10-CM | POA: Diagnosis not present

## 2017-08-02 DIAGNOSIS — Z981 Arthrodesis status: Secondary | ICD-10-CM | POA: Diagnosis not present

## 2017-08-02 DIAGNOSIS — Z79899 Other long term (current) drug therapy: Secondary | ICD-10-CM | POA: Diagnosis not present

## 2017-08-02 DIAGNOSIS — D649 Anemia, unspecified: Secondary | ICD-10-CM | POA: Diagnosis not present

## 2017-08-02 DIAGNOSIS — R600 Localized edema: Secondary | ICD-10-CM | POA: Diagnosis not present

## 2017-08-02 DIAGNOSIS — E669 Obesity, unspecified: Secondary | ICD-10-CM | POA: Diagnosis present

## 2017-08-02 DIAGNOSIS — D72829 Elevated white blood cell count, unspecified: Secondary | ICD-10-CM | POA: Diagnosis not present

## 2017-08-02 DIAGNOSIS — N183 Chronic kidney disease, stage 3 (moderate): Secondary | ICD-10-CM | POA: Diagnosis not present

## 2017-08-02 DIAGNOSIS — K625 Hemorrhage of anus and rectum: Secondary | ICD-10-CM | POA: Diagnosis not present

## 2017-08-02 DIAGNOSIS — Z931 Gastrostomy status: Secondary | ICD-10-CM | POA: Diagnosis not present

## 2017-08-02 DIAGNOSIS — R000 Tachycardia, unspecified: Secondary | ICD-10-CM | POA: Diagnosis not present

## 2017-08-02 DIAGNOSIS — Z7409 Other reduced mobility: Secondary | ICD-10-CM | POA: Diagnosis not present

## 2017-08-02 DIAGNOSIS — Z6836 Body mass index (BMI) 36.0-36.9, adult: Secondary | ICD-10-CM | POA: Diagnosis not present

## 2017-08-02 DIAGNOSIS — R140 Abdominal distension (gaseous): Secondary | ICD-10-CM | POA: Diagnosis not present

## 2017-08-02 DIAGNOSIS — D631 Anemia in chronic kidney disease: Secondary | ICD-10-CM | POA: Diagnosis not present

## 2017-08-02 DIAGNOSIS — Z978 Presence of other specified devices: Secondary | ICD-10-CM | POA: Diagnosis not present

## 2017-08-02 DIAGNOSIS — K56699 Other intestinal obstruction unspecified as to partial versus complete obstruction: Secondary | ICD-10-CM | POA: Diagnosis not present

## 2017-08-02 DIAGNOSIS — N390 Urinary tract infection, site not specified: Secondary | ICD-10-CM | POA: Diagnosis not present

## 2017-08-02 DIAGNOSIS — N179 Acute kidney failure, unspecified: Secondary | ICD-10-CM | POA: Diagnosis not present

## 2017-08-02 DIAGNOSIS — K598 Other specified functional intestinal disorders: Secondary | ICD-10-CM | POA: Diagnosis not present

## 2017-08-02 DIAGNOSIS — K9189 Other postprocedural complications and disorders of digestive system: Secondary | ICD-10-CM | POA: Diagnosis not present

## 2017-08-02 DIAGNOSIS — K913 Postprocedural intestinal obstruction, unspecified as to partial versus complete: Secondary | ICD-10-CM | POA: Diagnosis present

## 2017-08-02 DIAGNOSIS — M48061 Spinal stenosis, lumbar region without neurogenic claudication: Secondary | ICD-10-CM | POA: Diagnosis not present

## 2017-08-06 DIAGNOSIS — Z7409 Other reduced mobility: Secondary | ICD-10-CM | POA: Diagnosis not present

## 2017-08-06 DIAGNOSIS — E039 Hypothyroidism, unspecified: Secondary | ICD-10-CM | POA: Diagnosis present

## 2017-08-06 DIAGNOSIS — K567 Ileus, unspecified: Secondary | ICD-10-CM | POA: Diagnosis not present

## 2017-08-06 DIAGNOSIS — D62 Acute posthemorrhagic anemia: Secondary | ICD-10-CM | POA: Diagnosis not present

## 2017-08-06 DIAGNOSIS — E785 Hyperlipidemia, unspecified: Secondary | ICD-10-CM | POA: Diagnosis not present

## 2017-08-06 DIAGNOSIS — Z87440 Personal history of urinary (tract) infections: Secondary | ICD-10-CM | POA: Diagnosis not present

## 2017-08-06 DIAGNOSIS — M549 Dorsalgia, unspecified: Secondary | ICD-10-CM | POA: Diagnosis not present

## 2017-08-06 DIAGNOSIS — D638 Anemia in other chronic diseases classified elsewhere: Secondary | ICD-10-CM | POA: Diagnosis not present

## 2017-08-06 DIAGNOSIS — M48061 Spinal stenosis, lumbar region without neurogenic claudication: Secondary | ICD-10-CM | POA: Diagnosis present

## 2017-08-06 DIAGNOSIS — R600 Localized edema: Secondary | ICD-10-CM | POA: Diagnosis not present

## 2017-08-06 DIAGNOSIS — D631 Anemia in chronic kidney disease: Secondary | ICD-10-CM | POA: Diagnosis not present

## 2017-08-06 DIAGNOSIS — I129 Hypertensive chronic kidney disease with stage 1 through stage 4 chronic kidney disease, or unspecified chronic kidney disease: Secondary | ICD-10-CM | POA: Diagnosis not present

## 2017-08-06 DIAGNOSIS — Z48811 Encounter for surgical aftercare following surgery on the nervous system: Secondary | ICD-10-CM | POA: Diagnosis not present

## 2017-08-06 DIAGNOSIS — Z4789 Encounter for other orthopedic aftercare: Secondary | ICD-10-CM | POA: Diagnosis not present

## 2017-08-06 DIAGNOSIS — G8918 Other acute postprocedural pain: Secondary | ICD-10-CM | POA: Diagnosis not present

## 2017-08-06 DIAGNOSIS — G959 Disease of spinal cord, unspecified: Secondary | ICD-10-CM | POA: Diagnosis present

## 2017-08-06 DIAGNOSIS — N183 Chronic kidney disease, stage 3 (moderate): Secondary | ICD-10-CM | POA: Diagnosis not present

## 2017-08-06 DIAGNOSIS — Z981 Arthrodesis status: Secondary | ICD-10-CM | POA: Diagnosis not present

## 2017-08-20 DIAGNOSIS — E785 Hyperlipidemia, unspecified: Secondary | ICD-10-CM | POA: Diagnosis not present

## 2017-08-20 DIAGNOSIS — N189 Chronic kidney disease, unspecified: Secondary | ICD-10-CM | POA: Diagnosis not present

## 2017-08-20 DIAGNOSIS — D631 Anemia in chronic kidney disease: Secondary | ICD-10-CM | POA: Diagnosis not present

## 2017-08-20 DIAGNOSIS — M48061 Spinal stenosis, lumbar region without neurogenic claudication: Secondary | ICD-10-CM | POA: Diagnosis not present

## 2017-08-20 DIAGNOSIS — M1990 Unspecified osteoarthritis, unspecified site: Secondary | ICD-10-CM | POA: Diagnosis not present

## 2017-08-20 DIAGNOSIS — Z7982 Long term (current) use of aspirin: Secondary | ICD-10-CM | POA: Diagnosis not present

## 2017-08-20 DIAGNOSIS — J45909 Unspecified asthma, uncomplicated: Secondary | ICD-10-CM | POA: Diagnosis not present

## 2017-08-20 DIAGNOSIS — M4716 Other spondylosis with myelopathy, lumbar region: Secondary | ICD-10-CM | POA: Diagnosis not present

## 2017-08-20 DIAGNOSIS — Z87891 Personal history of nicotine dependence: Secondary | ICD-10-CM | POA: Diagnosis not present

## 2017-08-20 DIAGNOSIS — I129 Hypertensive chronic kidney disease with stage 1 through stage 4 chronic kidney disease, or unspecified chronic kidney disease: Secondary | ICD-10-CM | POA: Diagnosis not present

## 2017-08-20 DIAGNOSIS — Z4789 Encounter for other orthopedic aftercare: Secondary | ICD-10-CM | POA: Diagnosis not present

## 2017-08-25 DIAGNOSIS — N189 Chronic kidney disease, unspecified: Secondary | ICD-10-CM | POA: Diagnosis not present

## 2017-08-25 DIAGNOSIS — D631 Anemia in chronic kidney disease: Secondary | ICD-10-CM | POA: Diagnosis not present

## 2017-08-25 DIAGNOSIS — M4716 Other spondylosis with myelopathy, lumbar region: Secondary | ICD-10-CM | POA: Diagnosis not present

## 2017-08-25 DIAGNOSIS — M48061 Spinal stenosis, lumbar region without neurogenic claudication: Secondary | ICD-10-CM | POA: Diagnosis not present

## 2017-08-25 DIAGNOSIS — Z4789 Encounter for other orthopedic aftercare: Secondary | ICD-10-CM | POA: Diagnosis not present

## 2017-08-25 DIAGNOSIS — I129 Hypertensive chronic kidney disease with stage 1 through stage 4 chronic kidney disease, or unspecified chronic kidney disease: Secondary | ICD-10-CM | POA: Diagnosis not present

## 2017-08-27 DIAGNOSIS — D631 Anemia in chronic kidney disease: Secondary | ICD-10-CM | POA: Diagnosis not present

## 2017-08-27 DIAGNOSIS — M4716 Other spondylosis with myelopathy, lumbar region: Secondary | ICD-10-CM | POA: Diagnosis not present

## 2017-08-27 DIAGNOSIS — Z4789 Encounter for other orthopedic aftercare: Secondary | ICD-10-CM | POA: Diagnosis not present

## 2017-08-27 DIAGNOSIS — N189 Chronic kidney disease, unspecified: Secondary | ICD-10-CM | POA: Diagnosis not present

## 2017-08-27 DIAGNOSIS — M48061 Spinal stenosis, lumbar region without neurogenic claudication: Secondary | ICD-10-CM | POA: Diagnosis not present

## 2017-08-27 DIAGNOSIS — I129 Hypertensive chronic kidney disease with stage 1 through stage 4 chronic kidney disease, or unspecified chronic kidney disease: Secondary | ICD-10-CM | POA: Diagnosis not present

## 2017-08-30 DIAGNOSIS — M545 Low back pain: Secondary | ICD-10-CM | POA: Diagnosis not present

## 2017-08-31 DIAGNOSIS — D631 Anemia in chronic kidney disease: Secondary | ICD-10-CM | POA: Diagnosis not present

## 2017-08-31 DIAGNOSIS — Z4789 Encounter for other orthopedic aftercare: Secondary | ICD-10-CM | POA: Diagnosis not present

## 2017-08-31 DIAGNOSIS — M4716 Other spondylosis with myelopathy, lumbar region: Secondary | ICD-10-CM | POA: Diagnosis not present

## 2017-08-31 DIAGNOSIS — M48061 Spinal stenosis, lumbar region without neurogenic claudication: Secondary | ICD-10-CM | POA: Diagnosis not present

## 2017-08-31 DIAGNOSIS — N189 Chronic kidney disease, unspecified: Secondary | ICD-10-CM | POA: Diagnosis not present

## 2017-08-31 DIAGNOSIS — I129 Hypertensive chronic kidney disease with stage 1 through stage 4 chronic kidney disease, or unspecified chronic kidney disease: Secondary | ICD-10-CM | POA: Diagnosis not present

## 2017-09-01 DIAGNOSIS — Z981 Arthrodesis status: Secondary | ICD-10-CM | POA: Diagnosis not present

## 2017-09-01 DIAGNOSIS — D6489 Other specified anemias: Secondary | ICD-10-CM | POA: Diagnosis not present

## 2017-09-01 DIAGNOSIS — Z4789 Encounter for other orthopedic aftercare: Secondary | ICD-10-CM | POA: Diagnosis not present

## 2017-09-01 DIAGNOSIS — N184 Chronic kidney disease, stage 4 (severe): Secondary | ICD-10-CM | POA: Diagnosis not present

## 2017-09-01 DIAGNOSIS — R32 Unspecified urinary incontinence: Secondary | ICD-10-CM | POA: Diagnosis not present

## 2017-09-01 DIAGNOSIS — I10 Essential (primary) hypertension: Secondary | ICD-10-CM | POA: Diagnosis not present

## 2017-09-06 DIAGNOSIS — Z981 Arthrodesis status: Secondary | ICD-10-CM | POA: Diagnosis not present

## 2017-09-06 DIAGNOSIS — K7469 Other cirrhosis of liver: Secondary | ICD-10-CM | POA: Diagnosis not present

## 2017-09-08 ENCOUNTER — Other Ambulatory Visit: Payer: Self-pay | Admitting: Nurse Practitioner

## 2017-09-08 DIAGNOSIS — M4716 Other spondylosis with myelopathy, lumbar region: Secondary | ICD-10-CM | POA: Diagnosis not present

## 2017-09-08 DIAGNOSIS — D631 Anemia in chronic kidney disease: Secondary | ICD-10-CM | POA: Diagnosis not present

## 2017-09-08 DIAGNOSIS — M48061 Spinal stenosis, lumbar region without neurogenic claudication: Secondary | ICD-10-CM | POA: Diagnosis not present

## 2017-09-08 DIAGNOSIS — I129 Hypertensive chronic kidney disease with stage 1 through stage 4 chronic kidney disease, or unspecified chronic kidney disease: Secondary | ICD-10-CM | POA: Diagnosis not present

## 2017-09-08 DIAGNOSIS — Z4789 Encounter for other orthopedic aftercare: Secondary | ICD-10-CM | POA: Diagnosis not present

## 2017-09-08 DIAGNOSIS — N189 Chronic kidney disease, unspecified: Secondary | ICD-10-CM | POA: Diagnosis not present

## 2017-09-08 DIAGNOSIS — K7469 Other cirrhosis of liver: Secondary | ICD-10-CM

## 2017-09-10 DIAGNOSIS — I129 Hypertensive chronic kidney disease with stage 1 through stage 4 chronic kidney disease, or unspecified chronic kidney disease: Secondary | ICD-10-CM | POA: Diagnosis not present

## 2017-09-10 DIAGNOSIS — M48061 Spinal stenosis, lumbar region without neurogenic claudication: Secondary | ICD-10-CM | POA: Diagnosis not present

## 2017-09-10 DIAGNOSIS — N189 Chronic kidney disease, unspecified: Secondary | ICD-10-CM | POA: Diagnosis not present

## 2017-09-10 DIAGNOSIS — Z4789 Encounter for other orthopedic aftercare: Secondary | ICD-10-CM | POA: Diagnosis not present

## 2017-09-10 DIAGNOSIS — D631 Anemia in chronic kidney disease: Secondary | ICD-10-CM | POA: Diagnosis not present

## 2017-09-10 DIAGNOSIS — M4716 Other spondylosis with myelopathy, lumbar region: Secondary | ICD-10-CM | POA: Diagnosis not present

## 2017-09-16 DIAGNOSIS — E669 Obesity, unspecified: Secondary | ICD-10-CM | POA: Diagnosis not present

## 2017-09-16 DIAGNOSIS — I129 Hypertensive chronic kidney disease with stage 1 through stage 4 chronic kidney disease, or unspecified chronic kidney disease: Secondary | ICD-10-CM | POA: Diagnosis not present

## 2017-09-16 DIAGNOSIS — B1920 Unspecified viral hepatitis C without hepatic coma: Secondary | ICD-10-CM | POA: Diagnosis not present

## 2017-09-16 DIAGNOSIS — E1122 Type 2 diabetes mellitus with diabetic chronic kidney disease: Secondary | ICD-10-CM | POA: Diagnosis not present

## 2017-09-16 DIAGNOSIS — M4716 Other spondylosis with myelopathy, lumbar region: Secondary | ICD-10-CM | POA: Diagnosis not present

## 2017-09-16 DIAGNOSIS — R945 Abnormal results of liver function studies: Secondary | ICD-10-CM | POA: Diagnosis not present

## 2017-09-16 DIAGNOSIS — N189 Chronic kidney disease, unspecified: Secondary | ICD-10-CM | POA: Diagnosis not present

## 2017-09-16 DIAGNOSIS — F0781 Postconcussional syndrome: Secondary | ICD-10-CM | POA: Diagnosis not present

## 2017-09-16 DIAGNOSIS — Z4789 Encounter for other orthopedic aftercare: Secondary | ICD-10-CM | POA: Diagnosis not present

## 2017-09-16 DIAGNOSIS — N179 Acute kidney failure, unspecified: Secondary | ICD-10-CM | POA: Diagnosis not present

## 2017-09-16 DIAGNOSIS — D631 Anemia in chronic kidney disease: Secondary | ICD-10-CM | POA: Diagnosis not present

## 2017-09-16 DIAGNOSIS — E611 Iron deficiency: Secondary | ICD-10-CM | POA: Diagnosis not present

## 2017-09-16 DIAGNOSIS — N183 Chronic kidney disease, stage 3 (moderate): Secondary | ICD-10-CM | POA: Diagnosis not present

## 2017-09-16 DIAGNOSIS — M48061 Spinal stenosis, lumbar region without neurogenic claudication: Secondary | ICD-10-CM | POA: Diagnosis not present

## 2017-09-17 DIAGNOSIS — M48061 Spinal stenosis, lumbar region without neurogenic claudication: Secondary | ICD-10-CM | POA: Diagnosis not present

## 2017-09-17 DIAGNOSIS — N189 Chronic kidney disease, unspecified: Secondary | ICD-10-CM | POA: Diagnosis not present

## 2017-09-17 DIAGNOSIS — Z4789 Encounter for other orthopedic aftercare: Secondary | ICD-10-CM | POA: Diagnosis not present

## 2017-09-17 DIAGNOSIS — M4716 Other spondylosis with myelopathy, lumbar region: Secondary | ICD-10-CM | POA: Diagnosis not present

## 2017-09-17 DIAGNOSIS — D631 Anemia in chronic kidney disease: Secondary | ICD-10-CM | POA: Diagnosis not present

## 2017-09-17 DIAGNOSIS — I129 Hypertensive chronic kidney disease with stage 1 through stage 4 chronic kidney disease, or unspecified chronic kidney disease: Secondary | ICD-10-CM | POA: Diagnosis not present

## 2017-09-23 DIAGNOSIS — N189 Chronic kidney disease, unspecified: Secondary | ICD-10-CM | POA: Diagnosis not present

## 2017-09-23 DIAGNOSIS — M4716 Other spondylosis with myelopathy, lumbar region: Secondary | ICD-10-CM | POA: Diagnosis not present

## 2017-09-23 DIAGNOSIS — D631 Anemia in chronic kidney disease: Secondary | ICD-10-CM | POA: Diagnosis not present

## 2017-09-23 DIAGNOSIS — I129 Hypertensive chronic kidney disease with stage 1 through stage 4 chronic kidney disease, or unspecified chronic kidney disease: Secondary | ICD-10-CM | POA: Diagnosis not present

## 2017-09-23 DIAGNOSIS — Z4789 Encounter for other orthopedic aftercare: Secondary | ICD-10-CM | POA: Diagnosis not present

## 2017-09-23 DIAGNOSIS — M48061 Spinal stenosis, lumbar region without neurogenic claudication: Secondary | ICD-10-CM | POA: Diagnosis not present

## 2017-09-29 ENCOUNTER — Encounter: Payer: Self-pay | Admitting: Neurology

## 2017-09-29 ENCOUNTER — Telehealth: Payer: Self-pay | Admitting: Neurology

## 2017-09-29 ENCOUNTER — Ambulatory Visit (INDEPENDENT_AMBULATORY_CARE_PROVIDER_SITE_OTHER): Payer: Medicare Other | Admitting: Neurology

## 2017-09-29 VITALS — BP 109/58 | HR 60

## 2017-09-29 DIAGNOSIS — G43709 Chronic migraine without aura, not intractable, without status migrainosus: Secondary | ICD-10-CM | POA: Diagnosis not present

## 2017-09-29 NOTE — Progress Notes (Signed)

## 2017-09-29 NOTE — Telephone Encounter (Signed)
Pt. Needs botox in 12wks.

## 2017-09-29 NOTE — Progress Notes (Signed)
Botox-100 units x 2 vials Lot: [id number] Expiration: 03/2020 NDC: 4949-4473-95  Bacteriostatic 0.9% Sodium Chloride- 62mL total Lot: [id number] Expiration: 02/10/2019 NDC: 2787-1836-72 Dx: V50.016 B/B //BCrn

## 2017-09-30 DIAGNOSIS — M4716 Other spondylosis with myelopathy, lumbar region: Secondary | ICD-10-CM | POA: Diagnosis not present

## 2017-09-30 DIAGNOSIS — D631 Anemia in chronic kidney disease: Secondary | ICD-10-CM | POA: Diagnosis not present

## 2017-09-30 DIAGNOSIS — I129 Hypertensive chronic kidney disease with stage 1 through stage 4 chronic kidney disease, or unspecified chronic kidney disease: Secondary | ICD-10-CM | POA: Diagnosis not present

## 2017-09-30 DIAGNOSIS — M48061 Spinal stenosis, lumbar region without neurogenic claudication: Secondary | ICD-10-CM | POA: Diagnosis not present

## 2017-09-30 DIAGNOSIS — Z4789 Encounter for other orthopedic aftercare: Secondary | ICD-10-CM | POA: Diagnosis not present

## 2017-09-30 DIAGNOSIS — N189 Chronic kidney disease, unspecified: Secondary | ICD-10-CM | POA: Diagnosis not present

## 2017-10-01 NOTE — Telephone Encounter (Signed)
I called to scheduled the patient for her next injection, she did not answer so I left a VM asking her to call me back.

## 2017-10-29 DIAGNOSIS — N2581 Secondary hyperparathyroidism of renal origin: Secondary | ICD-10-CM | POA: Diagnosis not present

## 2017-10-29 DIAGNOSIS — D649 Anemia, unspecified: Secondary | ICD-10-CM | POA: Diagnosis not present

## 2017-10-29 DIAGNOSIS — N183 Chronic kidney disease, stage 3 (moderate): Secondary | ICD-10-CM | POA: Diagnosis not present

## 2017-11-10 DIAGNOSIS — M4326 Fusion of spine, lumbar region: Secondary | ICD-10-CM | POA: Diagnosis not present

## 2017-11-17 ENCOUNTER — Telehealth: Payer: Self-pay | Admitting: Neurology

## 2017-11-17 NOTE — Telephone Encounter (Signed)
Returned pt's call. She denied any issues, just stated she needed pre-approval for Botox. She stated she had already given staff here the number to call. She asked for a refill of Sumatriptan and said she had different insurance now [employer]). RN noted that patient has an active prescription from 04/2017 with 11 refills. Told patient RN would call pharmacy for further info and she verbalized appreciation.   Called pt's pharmacy. Sumatriptan is still covered, however price changed to $24 for 12 tablets instead of $10 for 12 tablets which pt was paying. Called patient and let her know. She stated she already knew the price and verbalized understanding that refills are active and no changes necessary at this time.

## 2017-11-17 NOTE — Telephone Encounter (Signed)
Patient would like to speak with a nurse regarding symptoms and headaches she is having and she would like a refill on sumatriptan. Please call and advise.

## 2017-12-23 ENCOUNTER — Telehealth: Payer: Self-pay | Admitting: Neurology

## 2017-12-23 NOTE — Telephone Encounter (Signed)
I got the authorization for the botox through [employer]. Auth: [id number] (exp. 11/23/17 to 12/23/18). I also gave a verbal RX to [place] the pharmacy at [facility] and they going to set up the process of getting the RX refilled.

## 2017-12-27 ENCOUNTER — Telehealth: Payer: Self-pay | Admitting: Neurology

## 2017-12-27 NOTE — Telephone Encounter (Signed)
I called the pharmacy and conferenced the patient in. I received a fax this morning but it was for a PA, I informed the patient we have already obtained the PA and it has been approved. The pharmacy told her to call copay assistance because she has a very high copay and does not want to pay that. The patient thought the copay was high because the PA had not been obtained, but that was not the case. The patient requested to reschedule until she could get some help with patient assistance.

## 2017-12-27 NOTE — Telephone Encounter (Signed)
Pt called wanting to know if Danielle rec'd the fax from [place] this morning. Please call to advise.

## 2017-12-29 ENCOUNTER — Ambulatory Visit: Payer: Medicare Other | Admitting: Neurology

## 2017-12-29 NOTE — Telephone Encounter (Signed)
Pt request Danielle to call. Pt said she has spoken with the insurance co

## 2017-12-29 NOTE — Telephone Encounter (Signed)
I called the patient back. She wants to cancel her apt for botox because she does not want to pay the copay. Her insurance plan has changed and she has a larger copay this year. We looked into patient assistance but she is not eligible because she has medicare.

## 2017-12-31 ENCOUNTER — Telehealth (INDEPENDENT_AMBULATORY_CARE_PROVIDER_SITE_OTHER): Payer: Self-pay | Admitting: Specialist

## 2017-12-31 DIAGNOSIS — N183 Chronic kidney disease, stage 3 (moderate): Secondary | ICD-10-CM | POA: Diagnosis not present

## 2017-12-31 DIAGNOSIS — D631 Anemia in chronic kidney disease: Secondary | ICD-10-CM | POA: Diagnosis not present

## 2017-12-31 NOTE — Telephone Encounter (Signed)
Patient requesting injection (did not specify what kind)  in her knee at her appt scheduled for 4/10.

## 2018-01-04 ENCOUNTER — Ambulatory Visit: Payer: Self-pay | Admitting: Neurology

## 2018-01-10 ENCOUNTER — Ambulatory Visit: Payer: Self-pay | Admitting: Neurology

## 2018-01-19 ENCOUNTER — Ambulatory Visit (INDEPENDENT_AMBULATORY_CARE_PROVIDER_SITE_OTHER): Payer: Medicare Other | Admitting: Specialist

## 2018-01-19 DIAGNOSIS — E7849 Other hyperlipidemia: Secondary | ICD-10-CM | POA: Diagnosis not present

## 2018-01-19 DIAGNOSIS — D6489 Other specified anemias: Secondary | ICD-10-CM | POA: Diagnosis not present

## 2018-01-19 DIAGNOSIS — K7460 Unspecified cirrhosis of liver: Secondary | ICD-10-CM | POA: Diagnosis not present

## 2018-01-19 DIAGNOSIS — E038 Other specified hypothyroidism: Secondary | ICD-10-CM | POA: Diagnosis not present

## 2018-01-19 DIAGNOSIS — I10 Essential (primary) hypertension: Secondary | ICD-10-CM | POA: Diagnosis not present

## 2018-01-19 DIAGNOSIS — Z6838 Body mass index (BMI) 38.0-38.9, adult: Secondary | ICD-10-CM | POA: Diagnosis not present

## 2018-01-19 DIAGNOSIS — N184 Chronic kidney disease, stage 4 (severe): Secondary | ICD-10-CM | POA: Diagnosis not present

## 2018-01-19 DIAGNOSIS — E1129 Type 2 diabetes mellitus with other diabetic kidney complication: Secondary | ICD-10-CM | POA: Diagnosis not present

## 2018-01-20 ENCOUNTER — Ambulatory Visit (INDEPENDENT_AMBULATORY_CARE_PROVIDER_SITE_OTHER): Payer: Medicare Other | Admitting: Surgery

## 2018-01-20 ENCOUNTER — Telehealth (INDEPENDENT_AMBULATORY_CARE_PROVIDER_SITE_OTHER): Payer: Self-pay | Admitting: Surgery

## 2018-01-20 VITALS — Ht 64.0 in | Wt 210.0 lb

## 2018-01-20 DIAGNOSIS — M79661 Pain in right lower leg: Secondary | ICD-10-CM | POA: Diagnosis not present

## 2018-01-20 DIAGNOSIS — M25561 Pain in right knee: Secondary | ICD-10-CM | POA: Diagnosis not present

## 2018-01-20 DIAGNOSIS — G8929 Other chronic pain: Secondary | ICD-10-CM | POA: Diagnosis not present

## 2018-01-20 NOTE — Telephone Encounter (Signed)
Laury Deep from [facility] left a vm to give stat result for this patient.  Please call and ask for Laury Deep in Ultrasound at [phone].  Thank you.

## 2018-01-20 NOTE — Progress Notes (Signed)
Office Visit Note   Patient: Becky Hobbs           Date of Birth: 09/28/1946           MRN: [id number] Visit Date: 01/20/2018              Requested by: Marton Redwood, MD [address] [place], [place] [address] PCP: Marton Redwood, MD   Assessment & Plan: Visit Diagnoses:  1. Pain of right calf   2. Chronic pain of right knee     Plan: With patient's increased posterior knee and calf pain with lower extremity swelling I will send her for stat venous Doppler to rule out DVT.  Will await callback report from the vascular lab.  Patient can follow-up with me in a couple weeks for recheck of her right knee and I will consider intra-articular Marcaine/Depo-Medrol injection at that time.  Follow-Up Instructions: No follow-ups on file.   Orders:  No orders of the defined types were placed in this encounter.  No orders of the defined types were placed in this encounter.     Procedures: No procedures performed   Clinical Data: No additional findings.   Subjective: Chief Complaint  Patient presents with  . Right Knee - Pain    HPI 72 year old female comes in with complaints of right knee pain and right calf pain and lower leg swelling.  Patient states that she has a history of right knee meniscus tear.  States that she never wanted surgery to help with that problem.    The last few weeks she has been complaining of increased right knee pain that extends into the right calf.  She has swelling bilateral lower extremities.  Does take Lasix.  No complaints of chest pain shortness of breath. Review of Systems No current cardiac pulmonary GI GU issues  Objective: Vital Signs: Ht 5\' 4"  (1.626 m)   Wt 210 lb (95.3 kg)   BMI 36.05 kg/m   Physical Exam  Constitutional: She is oriented to person, place, and time. No distress.  HENT:  Head: Normocephalic and atraumatic.  Eyes: Pupils are equal, round, and reactive to light.  Pulmonary/Chest: No respiratory distress.    Musculoskeletal:  Gait is antalgic with cane.  Knee range of motion about 0-120 degrees.  Does have some knee swelling.  Posterior knee tender with palpable tender large Baker's cyst.  Right calf is moderate to markedly tender.  Left calf nontender.  Bilateral lower extremity pitting edema.  Pain with McMurray's testing  Neurological: She is alert and oriented to person, place, and time.  Skin: Skin is warm and dry.    Ortho Exam  Specialty Comments:  No specialty comments available.  Imaging: No results found.   PMFS History: Patient Active Problem List   Diagnosis Date Noted  . Chronic migraine without aura without status migrainosus, not intractable 06/16/2017  . History of lumbar fusion 08/03/2016    Class: Chronic  . Migraine 04/09/2016  . Acute-on-chronic kidney injury ([place]) 09/07/2015  . Hypertension 09/07/2015  . Hypothyroidism 09/07/2015  . Gout 09/07/2015  . Low back pain 09/07/2015  . Obesity (BMI 30-39.9) 07/06/2013  . Headache 04/12/2013  . Concussion 04/12/2013  . Lumbar spinal stenosis 03/04/2012    Class: Diagnosis of   Past Medical History:  Diagnosis Date  . Anxiety    panic attacks  . Arthritis   . Asthma   . Blood transfusion    1973--with twins birth  . Diabetes mellitus  borderline  . H/O hiatal hernia    surgery fixed  . Heart murmur    MVP  . Hypertension   . Hypothyroidism     Family History  Problem Relation Age of Onset  . Kidney disease Mother   . Vascular Disease Mother     Past Surgical History:  Procedure Laterality Date  . ABDOMINAL HYSTERECTOMY    . APPENDECTOMY    . BACK SURGERY    . bladder tack    . BREAST CYST INCISION AND DRAINAGE    . BREAST SURGERY    . BUNIONECTOMY    . CHOLECYSTECTOMY    . FRACTURE SURGERY     left wrist  . LUMBAR FUSION  07/26/2017  . LUMBAR LAMINECTOMY  03/04/2012   Procedure: MICRODISCECTOMY LUMBAR LAMINECTOMY;  Surgeon: Jessy Oto, MD;  Location: [facility];  Service: Orthopedics;   Laterality: N/A;  L3-4 central laminectomy  . NASAL SEPTUM SURGERY    . nissan fundoplication    . OVARIAN CYST SURGERY    . THORACIC FUSION  07/26/2017  . TONSILLECTOMY     Social History   Occupational History  . Occupation: retired    Comment: Retired  Tobacco Use  . Smoking status: Never Smoker  . Smokeless tobacco: Never Used  Substance and Sexual Activity  . Alcohol use: No  . Drug use: No  . Sexual activity: Never

## 2018-01-21 NOTE — Telephone Encounter (Signed)
I called and left message with Larene Beach to have Zacarias Pontes call me back

## 2018-01-21 NOTE — Telephone Encounter (Signed)
We have the report back. Per Zacarias Pontes she was able to Speak with Jeneen Rinks yesterday

## 2018-02-02 ENCOUNTER — Other Ambulatory Visit (INDEPENDENT_AMBULATORY_CARE_PROVIDER_SITE_OTHER): Payer: Self-pay | Admitting: Specialist

## 2018-02-02 DIAGNOSIS — G8929 Other chronic pain: Secondary | ICD-10-CM

## 2018-02-02 DIAGNOSIS — G44329 Chronic post-traumatic headache, not intractable: Secondary | ICD-10-CM

## 2018-02-02 DIAGNOSIS — M5442 Lumbago with sciatica, left side: Principal | ICD-10-CM

## 2018-02-02 NOTE — Telephone Encounter (Signed)
Gabapentin refill request 

## 2018-02-09 ENCOUNTER — Emergency Department (HOSPITAL_COMMUNITY): Payer: Medicare Other

## 2018-02-09 ENCOUNTER — Other Ambulatory Visit: Payer: Self-pay

## 2018-02-09 ENCOUNTER — Encounter (HOSPITAL_COMMUNITY): Payer: Self-pay

## 2018-02-09 ENCOUNTER — Inpatient Hospital Stay (HOSPITAL_COMMUNITY)
Admission: EM | Admit: 2018-02-09 | Discharge: 2018-02-14 | DRG: 683 | Disposition: A | Discharge status: Home Health | Payer: Medicare Other | Attending: Internal Medicine | Admitting: Internal Medicine

## 2018-02-09 DIAGNOSIS — N179 Acute kidney failure, unspecified: Principal | ICD-10-CM | POA: Diagnosis present

## 2018-02-09 DIAGNOSIS — T501X5A Adverse effect of loop [high-ceiling] diuretics, initial encounter: Secondary | ICD-10-CM | POA: Diagnosis present

## 2018-02-09 DIAGNOSIS — B1920 Unspecified viral hepatitis C without hepatic coma: Secondary | ICD-10-CM | POA: Diagnosis not present

## 2018-02-09 DIAGNOSIS — N183 Chronic kidney disease, stage 3 (moderate): Secondary | ICD-10-CM | POA: Diagnosis present

## 2018-02-09 DIAGNOSIS — F410 Panic disorder [episodic paroxysmal anxiety] without agoraphobia: Secondary | ICD-10-CM | POA: Diagnosis present

## 2018-02-09 DIAGNOSIS — R0600 Dyspnea, unspecified: Secondary | ICD-10-CM | POA: Diagnosis not present

## 2018-02-09 DIAGNOSIS — R4781 Slurred speech: Secondary | ICD-10-CM | POA: Diagnosis not present

## 2018-02-09 DIAGNOSIS — N3946 Mixed incontinence: Secondary | ICD-10-CM | POA: Diagnosis not present

## 2018-02-09 DIAGNOSIS — Z90710 Acquired absence of both cervix and uterus: Secondary | ICD-10-CM

## 2018-02-09 DIAGNOSIS — R159 Full incontinence of feces: Secondary | ICD-10-CM | POA: Diagnosis not present

## 2018-02-09 DIAGNOSIS — R32 Unspecified urinary incontinence: Secondary | ICD-10-CM | POA: Diagnosis present

## 2018-02-09 DIAGNOSIS — Z7982 Long term (current) use of aspirin: Secondary | ICD-10-CM | POA: Diagnosis not present

## 2018-02-09 DIAGNOSIS — Z881 Allergy status to other antibiotic agents status: Secondary | ICD-10-CM

## 2018-02-09 DIAGNOSIS — Z880 Allergy status to penicillin: Secondary | ICD-10-CM

## 2018-02-09 DIAGNOSIS — N390 Urinary tract infection, site not specified: Secondary | ICD-10-CM | POA: Diagnosis present

## 2018-02-09 DIAGNOSIS — K529 Noninfective gastroenteritis and colitis, unspecified: Secondary | ICD-10-CM | POA: Diagnosis present

## 2018-02-09 DIAGNOSIS — Z79890 Hormone replacement therapy (postmenopausal): Secondary | ICD-10-CM

## 2018-02-09 DIAGNOSIS — Z885 Allergy status to narcotic agent status: Secondary | ICD-10-CM | POA: Diagnosis not present

## 2018-02-09 DIAGNOSIS — K7460 Unspecified cirrhosis of liver: Secondary | ICD-10-CM | POA: Diagnosis not present

## 2018-02-09 DIAGNOSIS — Z6836 Body mass index (BMI) 36.0-36.9, adult: Secondary | ICD-10-CM | POA: Diagnosis not present

## 2018-02-09 DIAGNOSIS — Z79899 Other long term (current) drug therapy: Secondary | ICD-10-CM

## 2018-02-09 DIAGNOSIS — E039 Hypothyroidism, unspecified: Secondary | ICD-10-CM | POA: Diagnosis present

## 2018-02-09 DIAGNOSIS — I959 Hypotension, unspecified: Secondary | ICD-10-CM | POA: Diagnosis present

## 2018-02-09 DIAGNOSIS — E1122 Type 2 diabetes mellitus with diabetic chronic kidney disease: Secondary | ICD-10-CM | POA: Diagnosis present

## 2018-02-09 DIAGNOSIS — Z888 Allergy status to other drugs, medicaments and biological substances status: Secondary | ICD-10-CM

## 2018-02-09 DIAGNOSIS — E038 Other specified hypothyroidism: Secondary | ICD-10-CM | POA: Diagnosis not present

## 2018-02-09 DIAGNOSIS — I129 Hypertensive chronic kidney disease with stage 1 through stage 4 chronic kidney disease, or unspecified chronic kidney disease: Secondary | ICD-10-CM | POA: Diagnosis present

## 2018-02-09 DIAGNOSIS — M549 Dorsalgia, unspecified: Secondary | ICD-10-CM | POA: Diagnosis present

## 2018-02-09 DIAGNOSIS — R531 Weakness: Secondary | ICD-10-CM | POA: Diagnosis not present

## 2018-02-09 DIAGNOSIS — D649 Anemia, unspecified: Secondary | ICD-10-CM | POA: Diagnosis not present

## 2018-02-09 DIAGNOSIS — G8929 Other chronic pain: Secondary | ICD-10-CM | POA: Diagnosis present

## 2018-02-09 DIAGNOSIS — R0602 Shortness of breath: Secondary | ICD-10-CM

## 2018-02-09 DIAGNOSIS — M79602 Pain in left arm: Secondary | ICD-10-CM | POA: Diagnosis not present

## 2018-02-09 DIAGNOSIS — Z981 Arthrodesis status: Secondary | ICD-10-CM

## 2018-02-09 DIAGNOSIS — E1129 Type 2 diabetes mellitus with other diabetic kidney complication: Secondary | ICD-10-CM | POA: Diagnosis not present

## 2018-02-09 DIAGNOSIS — I10 Essential (primary) hypertension: Secondary | ICD-10-CM | POA: Diagnosis not present

## 2018-02-09 DIAGNOSIS — J45909 Unspecified asthma, uncomplicated: Secondary | ICD-10-CM | POA: Diagnosis present

## 2018-02-09 DIAGNOSIS — E860 Dehydration: Secondary | ICD-10-CM | POA: Diagnosis not present

## 2018-02-09 DIAGNOSIS — E7849 Other hyperlipidemia: Secondary | ICD-10-CM | POA: Diagnosis not present

## 2018-02-09 LAB — CBC WITH DIFFERENTIAL/PLATELET
Basophils Absolute: 0 10*3/uL (ref 0.0–0.1)
Basophils Relative: 0 %
Eosinophils Absolute: 0.6 10*3/uL (ref 0.0–0.7)
Eosinophils Relative: 5 %
HCT: 30 % — ABNORMAL LOW (ref 36.0–46.0)
Hemoglobin: 9.9 g/dL — ABNORMAL LOW (ref 12.0–15.0)
Lymphocytes Relative: 20 %
Lymphs Abs: 2.4 10*3/uL (ref 0.7–4.0)
MCH: 28.2 pg (ref 26.0–34.0)
MCHC: 33 g/dL (ref 30.0–36.0)
MCV: 85.5 fL (ref 78.0–100.0)
Monocytes Absolute: 1.1 10*3/uL — ABNORMAL HIGH (ref 0.1–1.0)
Monocytes Relative: 9 %
Neutro Abs: 8 10*3/uL — ABNORMAL HIGH (ref 1.7–7.7)
Neutrophils Relative %: 66 %
Platelets: 258 10*3/uL (ref 150–400)
RBC: 3.51 MIL/uL — ABNORMAL LOW (ref 3.87–5.11)
RDW: 14.4 % (ref 11.5–15.5)
WBC: 12.1 10*3/uL — ABNORMAL HIGH (ref 4.0–10.5)

## 2018-02-09 LAB — COMPREHENSIVE METABOLIC PANEL
ALT: 67 U/L — ABNORMAL HIGH (ref 14–54)
AST: 96 U/L — ABNORMAL HIGH (ref 15–41)
Albumin: 3.7 g/dL (ref 3.5–5.0)
Alkaline Phosphatase: 131 U/L — ABNORMAL HIGH (ref 38–126)
Anion gap: 20 — ABNORMAL HIGH (ref 5–15)
BUN: 105 mg/dL — ABNORMAL HIGH (ref 6–20)
CO2: 17 mmol/L — ABNORMAL LOW (ref 22–32)
Calcium: 9.5 mg/dL (ref 8.9–10.3)
Chloride: 92 mmol/L — ABNORMAL LOW (ref 101–111)
Creatinine, Ser: 6.19 mg/dL — ABNORMAL HIGH (ref 0.44–1.00)
GFR calc Af Amer: 7 mL/min — ABNORMAL LOW (ref >60)
GFR calc non Af Amer: 6 mL/min — ABNORMAL LOW (ref >60)
Glucose, Bld: 102 mg/dL — ABNORMAL HIGH (ref 65–99)
Potassium: 3.7 mmol/L (ref 3.5–5.1)
Sodium: 129 mmol/L — ABNORMAL LOW (ref 135–145)
Total Bilirubin: 0.4 mg/dL (ref 0.3–1.2)
Total Protein: 8.2 g/dL — ABNORMAL HIGH (ref 6.5–8.1)

## 2018-02-09 LAB — URINALYSIS, ROUTINE W REFLEX MICROSCOPIC
Bilirubin Urine: NEGATIVE
Glucose, UA: NEGATIVE mg/dL
Ketones, ur: NEGATIVE mg/dL
Nitrite: NEGATIVE
Protein, ur: NEGATIVE mg/dL
Specific Gravity, Urine: 1.006 (ref 1.005–1.030)
pH: 5 (ref 5.0–8.0)

## 2018-02-09 LAB — I-STAT CG4 LACTIC ACID, ED: Lactic Acid, Venous: 0.99 mmol/L (ref 0.5–1.9)

## 2018-02-09 LAB — I-STAT TROPONIN, ED: Troponin i, poc: 0.01 ng/mL (ref 0.00–0.08)

## 2018-02-09 MED ORDER — FLUTICASONE PROPIONATE 50 MCG/ACT NA SUSP
1.0000 | Freq: Every day | NASAL | Status: DC
  Administered 2018-02-11: 1 via NASAL
  Filled 2018-02-09: qty 16

## 2018-02-09 MED ORDER — ACETAMINOPHEN 650 MG RE SUPP: 650.0000 mg | Freq: Four times a day (QID) | RECTAL | Status: DC | PRN

## 2018-02-09 MED ORDER — METOPROLOL SUCCINATE ER 100 MG PO TB24
100.0000 mg | ORAL_TABLET | Freq: Every day | ORAL | Status: DC
  Administered 2018-02-10 – 2018-02-14 (×5): 100 mg via ORAL
  Filled 2018-02-09 (×5): qty 1

## 2018-02-09 MED ORDER — VITAMIN D 1000 UNITS PO TABS
5000.0000 [IU] | ORAL_TABLET | Freq: Every day | ORAL | Status: DC
  Administered 2018-02-10 – 2018-02-14 (×5): 5000 [IU] via ORAL
  Filled 2018-02-09 (×6): qty 5

## 2018-02-09 MED ORDER — ONDANSETRON HCL 4 MG/2ML IJ SOLN: 4.0000 mg | Freq: Four times a day (QID) | INTRAMUSCULAR | Status: DC | PRN

## 2018-02-09 MED ORDER — HYDRALAZINE HCL 25 MG PO TABS
25.0000 mg | ORAL_TABLET | Freq: Three times a day (TID) | ORAL | Status: DC
  Administered 2018-02-10 (×2): 25 mg via ORAL
  Filled 2018-02-09 (×4): qty 1

## 2018-02-09 MED ORDER — ALPRAZOLAM 0.5 MG PO TABS: 0.5000 mg | ORAL_TABLET | Freq: Every day | ORAL | Status: DC | PRN

## 2018-02-09 MED ORDER — GABAPENTIN 300 MG PO CAPS
600.0000 mg | ORAL_CAPSULE | Freq: Every day | ORAL | Status: DC
  Administered 2018-02-10: 600 mg via ORAL
  Filled 2018-02-09 (×2): qty 2

## 2018-02-09 MED ORDER — ASPIRIN EC 81 MG PO TBEC
81.0000 mg | DELAYED_RELEASE_TABLET | Freq: Every day | ORAL | Status: DC
  Administered 2018-02-10 – 2018-02-14 (×5): 81 mg via ORAL
  Filled 2018-02-09 (×5): qty 1

## 2018-02-09 MED ORDER — EPINEPHRINE 0.3 MG/0.3ML IJ SOAJ
0.3000 mg | Freq: Every day | INTRAMUSCULAR | Status: DC | PRN
  Filled 2018-02-09: qty 0.3

## 2018-02-09 MED ORDER — ONDANSETRON HCL 4 MG PO TABS: 4.0000 mg | ORAL_TABLET | Freq: Four times a day (QID) | ORAL | Status: DC | PRN

## 2018-02-09 MED ORDER — ROSUVASTATIN CALCIUM 10 MG PO TABS
10.0000 mg | ORAL_TABLET | Freq: Every day | ORAL | Status: DC
  Administered 2018-02-10 – 2018-02-14 (×5): 10 mg via ORAL
  Filled 2018-02-09 (×5): qty 1

## 2018-02-09 MED ORDER — ALLOPURINOL 100 MG PO TABS
100.0000 mg | ORAL_TABLET | Freq: Every day | ORAL | Status: DC
  Administered 2018-02-10 – 2018-02-14 (×5): 100 mg via ORAL
  Filled 2018-02-09 (×5): qty 1

## 2018-02-09 MED ORDER — PANTOPRAZOLE SODIUM 40 MG PO TBEC
40.0000 mg | DELAYED_RELEASE_TABLET | Freq: Every day | ORAL | Status: DC
Start: 2018-02-10 — End: 2018-02-14
  Administered 2018-02-10 – 2018-02-14 (×5): 40 mg via ORAL
  Filled 2018-02-09 (×5): qty 1

## 2018-02-09 MED ORDER — HEPARIN SODIUM (PORCINE) 5000 UNIT/ML IJ SOLN
5000.0000 [IU] | Freq: Three times a day (TID) | INTRAMUSCULAR | Status: DC
  Administered 2018-02-10 – 2018-02-14 (×13): 5000 [IU] via SUBCUTANEOUS
  Filled 2018-02-09 (×13): qty 1

## 2018-02-09 MED ORDER — TIZANIDINE HCL 2 MG PO TABS
2.0000 mg | ORAL_TABLET | Freq: Four times a day (QID) | ORAL | Status: DC | PRN
  Filled 2018-02-09: qty 1

## 2018-02-09 MED ORDER — ROPINIROLE HCL 0.5 MG PO TABS
0.5000 mg | ORAL_TABLET | Freq: Every evening | ORAL | Status: DC | PRN
  Filled 2018-02-09: qty 1

## 2018-02-09 MED ORDER — INSULIN ASPART 100 UNIT/ML ~~LOC~~ SOLN: 0.0000 [IU] | Freq: Every day | SUBCUTANEOUS | Status: DC

## 2018-02-09 MED ORDER — GABAPENTIN 300 MG PO CAPS
300.0000 mg | ORAL_CAPSULE | Freq: Every morning | ORAL | Status: DC
  Administered 2018-02-10 – 2018-02-11 (×2): 300 mg via ORAL
  Filled 2018-02-09: qty 1

## 2018-02-09 MED ORDER — ADULT MULTIVITAMIN W/MINERALS CH
1.0000 | ORAL_TABLET | Freq: Every day | ORAL | Status: DC
  Administered 2018-02-10 – 2018-02-14 (×5): 1 via ORAL
  Filled 2018-02-09 (×5): qty 1

## 2018-02-09 MED ORDER — SODIUM CHLORIDE 0.9 % IV BOLUS
1000.0000 mL | Freq: Once | INTRAVENOUS | Status: AC
  Administered 2018-02-09: 1000 mL via INTRAVENOUS

## 2018-02-09 MED ORDER — DULAGLUTIDE 1.5 MG/0.5ML ~~LOC~~ SOAJ: 1.5000 mg | SUBCUTANEOUS | Status: DC

## 2018-02-09 MED ORDER — INSULIN ASPART 100 UNIT/ML ~~LOC~~ SOLN
0.0000 [IU] | Freq: Three times a day (TID) | SUBCUTANEOUS | Status: DC
  Administered 2018-02-12: 1 [IU] via SUBCUTANEOUS

## 2018-02-09 MED ORDER — SODIUM CHLORIDE 0.9 % IV SOLN
INTRAVENOUS | Status: DC
  Administered 2018-02-10 – 2018-02-14 (×7): via INTRAVENOUS

## 2018-02-09 MED ORDER — LEVOTHYROXINE SODIUM 88 MCG PO TABS
88.0000 ug | ORAL_TABLET | Freq: Every day | ORAL | Status: DC
  Administered 2018-02-10 – 2018-02-14 (×5): 88 ug via ORAL
  Filled 2018-02-09 (×5): qty 1

## 2018-02-09 MED ORDER — ACETAMINOPHEN 325 MG PO TABS: 650.0000 mg | ORAL_TABLET | Freq: Four times a day (QID) | ORAL | Status: DC | PRN

## 2018-02-09 MED ORDER — ALBUTEROL SULFATE HFA 108 (90 BASE) MCG/ACT IN AERS: 2.0000 | INHALATION_SPRAY | Freq: Four times a day (QID) | RESPIRATORY_TRACT | Status: DC | PRN

## 2018-02-09 MED ORDER — OXYCODONE-ACETAMINOPHEN 5-325 MG PO TABS: 1.0000 | ORAL_TABLET | Freq: Four times a day (QID) | ORAL | Status: DC | PRN

## 2018-02-09 MED ORDER — ZOLPIDEM TARTRATE 5 MG PO TABS
5.0000 mg | ORAL_TABLET | Freq: Every evening | ORAL | Status: DC | PRN
  Administered 2018-02-10: 5 mg via ORAL
  Filled 2018-02-09: qty 1

## 2018-02-09 NOTE — ED Notes (Signed)
Portable xray at bedside.

## 2018-02-09 NOTE — ED Notes (Signed)
Admitting provider at bedside.

## 2018-02-09 NOTE — ED Provider Notes (Signed)
Becky Hobbs Provider Note  CSN: [id number] Arrival date & time: 02/09/18 1718  Chief Complaint(s) Weakness  HPI Becky Hobbs is a 72 y.o. female patient with a history of hypertension on losartan and Lasix, history of urinary retention due to prolapsed bladder that resulted previously and renal failure presents to the emergency Hobbs with several days of generalized fatigue and dyspnea on exertion.  Patient also endorses decreased oral intake due to the fatigue.  Symptoms worsen with exertion improve at rest.  She is also endorsing decreased urinary output.  Denies any dysuria.  Denies any nausea/vomiting/abdominal pain.  She does endorse chronic diarrhea.  No associated chest pain.  Denies any other physical complaints.  HPI  Past Medical History Past Medical History:  Diagnosis Date  . Anxiety    panic attacks  . Arthritis   . Asthma   . Blood transfusion    1973--with twins birth  . Diabetes mellitus    borderline  . H/O hiatal hernia    surgery fixed  . Heart murmur    MVP  . Hypertension   . Hypothyroidism    Patient Active Problem List   Diagnosis Date Noted  . Chronic migraine without aura without status migrainosus, not intractable 06/16/2017  . History of lumbar fusion 08/03/2016    Class: Chronic  . Migraine 04/09/2016  . Acute-on-chronic kidney injury ([facility]) 09/07/2015  . Hypertension 09/07/2015  . Hypothyroidism 09/07/2015  . Gout 09/07/2015  . Low back pain 09/07/2015  . Obesity (BMI 30-39.9) 07/06/2013  . Headache 04/12/2013  . Concussion 04/12/2013  . Lumbar spinal stenosis 03/04/2012    Class: Diagnosis of   Home Medication(s) Prior to Admission medications   Medication Sig Start Date End Date Taking? Authorizing Provider  albuterol (PROVENTIL HFA;VENTOLIN HFA) 108 (90 BASE) MCG/ACT inhaler Inhale 2 puffs into the lungs every 6 (six) hours as needed for wheezing or shortness of breath. 04/11/15  Yes Melvenia Beam, MD  allopurinol (ZYLOPRIM) 100 MG tablet Take 100 mg by mouth daily.   Yes [provider]  ALPRAZolam Duanne Moron) 0.5 MG tablet Take 0.5 mg by mouth daily as needed for anxiety.  02/13/13  Yes [provider]  aspirin EC 81 MG tablet Take 81 mg by mouth daily.   Yes [provider]  bismuth subsalicylate (PEPTO BISMOL) 262 MG/15ML suspension Take 30 mLs by mouth every 6 (six) hours as needed for indigestion or diarrhea or loose stools.   Yes [provider]  cetirizine (ZYRTEC) 10 MG tablet Take 10 mg by mouth daily.   Yes [provider]  Cholecalciferol 5000 units TABS Take 5,000 Units by mouth daily.    Yes [provider]  EPINEPHrine (EPIPEN 2-PAK) 0.3 mg/0.3 mL IJ SOAJ injection Inject 0.3 mLs (0.3 mg total) into the skin once. Patient taking differently: Inject 0.3 mg into the skin daily as needed (allergic reaction).  04/11/15  Yes Melvenia Beam, MD  fluticasone (FLONASE) 50 MCG/ACT nasal spray INT 2 SPRAYS INTO EACH NOSTRIL as needed 08/13/16  Yes [provider]  furosemide (LASIX) 20 MG tablet Take 20 mg by mouth daily.   Yes [provider]  gabapentin (NEURONTIN) 300 MG capsule TAKE 1 CAPSULE BY MOUTH EVERY MORNING AND TAKE 2 CAPSULES EVERY NIGHT AT BEDTIME 02/03/18  Yes Jessy Oto, MD  hydrALAZINE (APRESOLINE) 25 MG tablet Take 25 mg by mouth as needed.    Yes [provider]  levothyroxine (SYNTHROID, [place])  88 MCG tablet Take 88 mcg by mouth daily before breakfast.    Yes [provider]  losartan (COZAAR) 100 MG tablet Take 100 mg by mouth daily.   Yes [provider]  metoprolol succinate (TOPROL-XL) 100 MG 24 hr tablet Take 100 mg by mouth daily. Take with or immediately following a meal.   Yes [provider]  Multiple Vitamin (MULITIVITAMIN WITH MINERALS) TABS Take 1 tablet by mouth daily.   Yes [provider]  ondansetron (ZOFRAN) 4 MG tablet Take  1 tablet (4 mg total) by mouth every 8 (eight) hours as needed for nausea. 04/12/13  Yes Marcial Pacas, MD  oxyCODONE-acetaminophen (PERCOCET/ROXICET) 5-325 MG per tablet Take 1 tablet by mouth every 6 (six) hours as needed for moderate pain.  01/25/15  Yes [provider]  pantoprazole (PROTONIX) 40 MG tablet Take 40 mg by mouth daily.   Yes [provider]  potassium chloride SA (K-DUR,KLOR-CON) 20 MEQ tablet Take 20 mEq by mouth daily as needed (fluid). Takes with torsemide.   Yes [provider]  rOPINIRole (REQUIP) 0.5 MG tablet Take 0.5 mg by mouth as needed.  12/14/16  Yes [provider]  rosuvastatin (CRESTOR) 10 MG tablet Take 10 mg by mouth daily.  06/30/17  Yes [provider]  SUMAtriptan (IMITREX) 100 MG tablet TAKE 1 TABLET(100 MG) BY MOUTH 1 TIME AS NEEDED FOR MIGRAINE. MAY REPEAT IN 2 HOURS IF HEADACHE PERSISTS OR RECURS 04/19/17  Yes Melvenia Beam, MD  tiZANidine (ZANAFLEX) 2 MG tablet TAKE 1 TO 2 TABLETS BY MOUTH EVERY 6 HOURS AS NEEDED FOR SPASM 02/04/17  Yes Jessy Oto, MD  TRULICITY 1.5 JK/9.3OI SOPN Inject 1.5 mg as directed once a week. 01/20/18  Yes [provider]  nortriptyline (PAMELOR) 25 MG capsule Take 1 capsule (25 mg total) by mouth at bedtime. Patient not taking: Reported on 02/09/2018 06/15/17   Melvenia Beam, MD  tiZANidine (ZANAFLEX) 2 MG tablet TAKE 1 TO 2 TABLETS BY MOUTH EVERY 6 HOURS AS NEEDED FOR SPASM Patient not taking: Reported on 02/09/2018 07/19/17   Jessy Oto, MD  torsemide (DEMADEX) 20 MG tablet Take 1 tablet (20 mg total) by mouth daily as needed (fluid). For edema. Patient not taking: Reported on 02/09/2018 09/17/15   Reyne Dumas, MD  zolpidem (AMBIEN) 10 MG tablet Take 10 mg by mouth at bedtime as needed for sleep.  04/08/13   [provider]                                                                                                                                    Past Surgical  History Past Surgical History:  Procedure Laterality Date  . ABDOMINAL HYSTERECTOMY    . APPENDECTOMY    . BACK SURGERY    . bladder tack    . BREAST CYST INCISION AND DRAINAGE    . BREAST SURGERY    .  BUNIONECTOMY    . CHOLECYSTECTOMY    . FRACTURE SURGERY     left wrist  . LUMBAR FUSION  07/26/2017  . LUMBAR LAMINECTOMY  03/04/2012   Procedure: MICRODISCECTOMY LUMBAR LAMINECTOMY;  Surgeon: Jessy Oto, MD;  Location: [facility];  Service: Orthopedics;  Laterality: N/A;  L3-4 central laminectomy  . NASAL SEPTUM SURGERY    . nissan fundoplication    . OVARIAN CYST SURGERY    . THORACIC FUSION  07/26/2017  . TONSILLECTOMY     Family History Family History  Problem Relation Age of Onset  . Kidney disease Mother   . Vascular Disease Mother     Social History Social History   Tobacco Use  . Smoking status: Never Smoker  . Smokeless tobacco: Never Used  Substance Use Topics  . Alcohol use: No  . Drug use: No   Allergies Other; Erythromycin; Hydrocodone; Morphine and related; Penicillins; and Tramadol hcl  Review of Systems Review of Systems All other systems are reviewed and are negative for acute change except as noted in the HPI  Physical Exam Vital Signs  I have reviewed the triage vital signs BP (!) 104/49   Pulse 75   Temp 97.8 F (36.6 C) (Oral)   Resp 12   Ht 5\' 4"  (1.626 m)   Wt 92.1 kg (203 lb)   SpO2 100%   BMI 34.84 kg/m    Physical Exam  Constitutional: She is oriented to person, place, and time. She appears well-developed and well-nourished. No distress.  HENT:  Head: Normocephalic and atraumatic.  Nose: Nose normal.  Eyes: Pupils are equal, round, and reactive to light. Conjunctivae and EOM are normal. Right eye exhibits no discharge. Left eye exhibits no discharge. No scleral icterus.  Neck: Normal range of motion. Neck supple.  Cardiovascular: Normal rate and regular rhythm. Exam reveals no gallop and no friction rub.  No murmur  heard. Pulmonary/Chest: Effort normal and breath sounds normal. No stridor. No respiratory distress. She has no rales.  Abdominal: Soft. She exhibits no distension. There is no tenderness.  Musculoskeletal: She exhibits no edema or tenderness.  Neurological: She is alert and oriented to person, place, and time.  Skin: Skin is warm and dry. No rash noted. She is not diaphoretic. No erythema.  Psychiatric: She has a normal mood and affect.  Vitals reviewed.   ED Results and Treatments Labs (all labs ordered are listed, but only abnormal results are displayed) Labs Reviewed  COMPREHENSIVE METABOLIC PANEL - Abnormal; Notable for the following components:      Result Value   Sodium 129 (*)    Chloride 92 (*)    CO2 17 (*)    Glucose, Bld 102 (*)    BUN 105 (*)    Creatinine, Ser 6.19 (*)    Total Protein 8.2 (*)    AST 96 (*)    ALT 67 (*)    Alkaline Phosphatase 131 (*)    GFR calc non Af Amer 6 (*)    GFR calc Af Amer 7 (*)    Anion gap 20 (*)    All other components within normal limits  CBC WITH DIFFERENTIAL/PLATELET - Abnormal; Notable for the following components:   WBC 12.1 (*)    RBC 3.51 (*)    Hemoglobin 9.9 (*)    HCT 30.0 (*)    Neutro Abs 8.0 (*)    Monocytes Absolute 1.1 (*)    All other components within normal limits  URINALYSIS, ROUTINE  W REFLEX MICROSCOPIC  I-STAT CG4 LACTIC ACID, ED  I-STAT TROPONIN, ED  I-STAT CG4 LACTIC ACID, ED                                                                                                                         EKG  EKG Interpretation  Date/Time:    Ventricular Rate:    PR Interval:    QRS Duration:   QT Interval:    QTC Calculation:   R Axis:     Text Interpretation:        Radiology Dg Chest Port 1 View  Result Date: 02/09/2018 CLINICAL DATA:  Dyspnea EXAM: PORTABLE CHEST 1 VIEW COMPARISON:  08/01/2017 FINDINGS: The heart size and mediastinal contours are within normal limits. Both lungs are clear. The  included visualized lower thoracic spinal fusion hardware is stable. IMPRESSION: No active disease. Electronically Signed   By: Ashley Royalty M.D.   On: 02/09/2018 22:21   Pertinent labs & imaging results that were available during my care of the patient were reviewed by me and considered in my medical decision making (see chart for details).  Medications Ordered in ED Medications  sodium chloride 0.9 % bolus 1,000 mL (1,000 mLs Intravenous New Bag/Given 02/09/18 2217)                                                                                                                                    Procedures Procedures CRITICAL CARE Performed by: Grayce Sessions Merlon Alcorta Total critical care time: 35 minutes Critical care time was exclusive of separately billable procedures and treating other patients. Critical care was necessary to treat or prevent imminent or life-threatening deterioration. Critical care was time spent personally by me on the following activities: development of treatment plan with patient and/or surrogate as well as nursing, discussions with consultants, evaluation of patient's response to treatment, examination of patient, obtaining history from patient or surrogate, ordering and performing treatments and interventions, ordering and review of laboratory studies, ordering and review of radiographic studies, pulse oximetry and re-evaluation of patient's condition.   (including critical care time)  Medical Decision Making / ED Course I have reviewed the nursing notes for this encounter and the patient's prior records (if available in EHR or on provided paperwork).    Work-up revealed acute renal failure.  Possibly medication related versus decreased oral intake versus urinary retention.  Bladder scan did  reveal 300 cc of urine in the bladder.  Potassium within normal limits.  Chest x-ray without evidence suggestive of pneumonia, pneumothorax, pneumomediastinum.  No abnormal  contour of the mediastinum to suggest dissection. No evidence of acute injuries.  Foley catheter inserted.  Case discussed with medicine who will admit the patient for continued work-up and management.  Final Clinical Impression(s) / ED Diagnoses Final diagnoses:  SOB (shortness of breath)  Acute renal failure, unspecified acute renal failure type [facility])      This chart was dictated using voice recognition software.  Despite best efforts to proofread,  errors can occur which can change the documentation meaning.  Fatima Blank, MD 02/09/18 2233

## 2018-02-09 NOTE — ED Triage Notes (Signed)
Pt endorses generalized weakness and shob since Saturday. Pt states "I can't take but a few steps and I have to catch my breath and it feels like my equilibrium is off" VSS. No neuro deficits. Breath sounds clear.

## 2018-02-10 ENCOUNTER — Inpatient Hospital Stay (HOSPITAL_COMMUNITY): Payer: Medicare Other

## 2018-02-10 DIAGNOSIS — N179 Acute kidney failure, unspecified: Principal | ICD-10-CM

## 2018-02-10 DIAGNOSIS — R0602 Shortness of breath: Secondary | ICD-10-CM

## 2018-02-10 LAB — GLUCOSE, CAPILLARY
Glucose-Capillary: 116 mg/dL — ABNORMAL HIGH (ref 65–99)
Glucose-Capillary: 129 mg/dL — ABNORMAL HIGH (ref 65–99)

## 2018-02-10 LAB — BASIC METABOLIC PANEL
Anion gap: 15 (ref 5–15)
BUN: 100 mg/dL — ABNORMAL HIGH (ref 6–20)
CO2: 19 mmol/L — ABNORMAL LOW (ref 22–32)
Calcium: 9 mg/dL (ref 8.9–10.3)
Chloride: 100 mmol/L — ABNORMAL LOW (ref 101–111)
Creatinine, Ser: 5.04 mg/dL — ABNORMAL HIGH (ref 0.44–1.00)
GFR calc Af Amer: 9 mL/min — ABNORMAL LOW (ref >60)
GFR calc non Af Amer: 8 mL/min — ABNORMAL LOW (ref >60)
Glucose, Bld: 98 mg/dL (ref 65–99)
Potassium: 3.7 mmol/L (ref 3.5–5.1)
Sodium: 134 mmol/L — ABNORMAL LOW (ref 135–145)

## 2018-02-10 LAB — CBG MONITORING, ED
Glucose-Capillary: 102 mg/dL — ABNORMAL HIGH (ref 65–99)
Glucose-Capillary: 109 mg/dL — ABNORMAL HIGH (ref 65–99)
Glucose-Capillary: 83 mg/dL (ref 65–99)

## 2018-02-10 MED ORDER — SODIUM CHLORIDE 0.9 % IV BOLUS
500.0000 mL | Freq: Once | INTRAVENOUS | Status: AC
  Administered 2018-02-10: 500 mL via INTRAVENOUS

## 2018-02-10 MED ORDER — ALBUTEROL SULFATE (2.5 MG/3ML) 0.083% IN NEBU: 2.5000 mg | INHALATION_SOLUTION | Freq: Four times a day (QID) | RESPIRATORY_TRACT | Status: DC | PRN

## 2018-02-10 MED ORDER — LEVOFLOXACIN IN D5W 500 MG/100ML IV SOLN
500.0000 mg | INTRAVENOUS | Status: DC
  Administered 2018-02-10 – 2018-02-12 (×2): 500 mg via INTRAVENOUS
  Filled 2018-02-10 (×2): qty 100

## 2018-02-10 NOTE — ED Notes (Signed)
Checked patient cbg it was 63 notified RN of blood sugar patient is resting with call bell in reach

## 2018-02-10 NOTE — Progress Notes (Signed)
PROGRESS NOTE    Becky Hobbs  KGY:[id number] DOB: 02-12-1946 DOA: 02/09/2018 PCP: Marton Redwood, MD  Brief Narrative:this Luttrull is a 72 year old female history of multiple spinal surgeries, diabetes mellitus, CK D3, hypertension, presented to the emergency room due to weakness, found to have borderline hypotension and acute renal failure. -No fevers or chills   Assessment & Plan:   Active Problems:   Acute renal failure (ARF) (HCC) -suspect this is prerenal in etiology secondary topoor by mouth intake, diuretic use -Worsened by ARB -Creatinine at baseline is around 1.5-1.7 From 2016 -Creatinine improving with IV fluids, continue IV fluids today, down from 6.2 5.1 this morning -Check renal ultrasound -Weighted nephrotoxins, renally dose medications -If creatinine does not continue to trend down further will consult renal  Possible UTI -Continue levofloxacin, follow-up urine cultures  Diabetes mellitus -Hold trulicity, sliding scale insulin for now  Hypertension -BP improving, diuretics and ARB on hold  Chronic incontinence of bowel and bladder -Following numerous spinal surgeries -Due to follow-up with GYN oncology for surgical repair  DVT prophylaxis: Code Status:  Family Communication: Disposition Plan:   Consultants:   none   Procedures:   Antimicrobials:  Antibiotics Given (last 72 hours)    Date/Time Action Medication Dose Rate   02/10/18 0030 New Bag/Given   levofloxacin (LEVAQUIN) IVPB 500 mg 500 mg 100 mL/hr      Subjective: -still feels weak, no nausea or vomiting -Poor appetite and less energy, no fevers or chills  Objective: Vitals:   02/10/18 0915 02/10/18 1000 02/10/18 1045 02/10/18 1130  BP: (!) 111/47 (!) 110/44 (!) 115/48 (!) 111/51  Pulse: 86 89 91 84  Resp: (!) 22 19 20 15   Temp:      TempSrc:      SpO2: 98% 96% 99% 100%  Weight:      Height:        Intake/Output Summary (Last 24 hours) at 02/10/2018 1144 Last data filed at  02/10/2018 1135 Gross per 24 hour  Intake 1524.58 ml  Output 2475 ml  Net -950.42 ml   Filed Weights   02/09/18 1731  Weight: 92.1 kg (203 lb)    Examination:  General exam: Appears calm and comfortable  Respiratory system: Clear to auscultation. Respiratory effort normal. Cardiovascular system: S1 & S2 heard, RRR. No JVD, murmurs, rubs, gallops Gastrointestinal system: Abdomen is nondistended, soft and nontender.Normal bowel sounds heard. Central nervous system: Alert and oriented. No focal neurological deficits. Extremities: Symmetric 5 x 5 power. Skin: No rashes, lesions or ulcers Psychiatry: Judgement and insight appear normal. Mood & affect appropriate.     Data Reviewed:   CBC: Recent Labs  Lab 02/09/18 1738  WBC 12.1*  NEUTROABS 8.0*  HGB 9.9*  HCT 30.0*  MCV 85.5  PLT 026   Basic Metabolic Panel: Recent Labs  Lab 02/09/18 1738 02/10/18 0424  NA 129* 134*  K 3.7 3.7  CL 92* 100*  CO2 17* 19*  GLUCOSE 102* 98  BUN 105* 100*  CREATININE 6.19* 5.04*  CALCIUM 9.5 9.0   GFR: Estimated Creatinine Clearance: 11.6 mL/min (A) (by C-G formula based on SCr of 5.04 mg/dL (H)). Liver Function Tests: Recent Labs  Lab 02/09/18 1738  AST 96*  ALT 67*  ALKPHOS 131*  BILITOT 0.4  PROT 8.2*  ALBUMIN 3.7   No results for input(s): LIPASE, AMYLASE in the last 168 hours. No results for input(s): AMMONIA in the last 168 hours. Coagulation Profile: No results for input(s): INR, PROTIME in the  last 168 hours. Cardiac Enzymes: No results for input(s): CKTOTAL, CKMB, CKMBINDEX, TROPONINI in the last 168 hours. BNP (last 3 results) No results for input(s): PROBNP in the last 8760 hours. HbA1C: No results for input(s): HGBA1C in the last 72 hours. CBG: Recent Labs  Lab 02/10/18 0003 02/10/18 0822  GLUCAP 102* 83   Lipid Profile: No results for input(s): CHOL, HDL, LDLCALC, TRIG, CHOLHDL, LDLDIRECT in the last 72 hours. Thyroid Function Tests: No results for  input(s): TSH, T4TOTAL, FREET4, T3FREE, THYROIDAB in the last 72 hours. Anemia Panel: No results for input(s): VITAMINB12, FOLATE, FERRITIN, TIBC, IRON, RETICCTPCT in the last 72 hours. Urine analysis:    Component Value Date/Time   COLORURINE YELLOW 02/09/2018 2203   APPEARANCEUR HAZY (A) 02/09/2018 2203   LABSPEC 1.006 02/09/2018 2203   PHURINE 5.0 02/09/2018 2203   GLUCOSEU NEGATIVE 02/09/2018 2203   HGBUR SMALL (A) 02/09/2018 2203   BILIRUBINUR NEGATIVE 02/09/2018 2203   KETONESUR NEGATIVE 02/09/2018 2203   PROTEINUR NEGATIVE 02/09/2018 2203   UROBILINOGEN 1.0 03/02/2012 1332   NITRITE NEGATIVE 02/09/2018 2203   LEUKOCYTESUR LARGE (A) 02/09/2018 2203   Sepsis Labs: @LABRCNTIP (procalcitonin:4,lacticidven:4)  )No results found for this or any previous visit (from the past 240 hour(s)).       Radiology Studies: Dg Chest Port 1 View  Result Date: 02/09/2018 CLINICAL DATA:  Dyspnea EXAM: PORTABLE CHEST 1 VIEW COMPARISON:  08/01/2017 FINDINGS: The heart size and mediastinal contours are within normal limits. Both lungs are clear. The included visualized lower thoracic spinal fusion hardware is stable. IMPRESSION: No active disease. Electronically Signed   By: Ashley Royalty M.D.   On: 02/09/2018 22:21        Scheduled Meds: . allopurinol  100 mg Oral Daily  . aspirin EC  81 mg Oral Daily  . cholecalciferol  5,000 Units Oral Daily  . Dulaglutide  1.5 mg Injection Weekly  . fluticasone  1 spray Each Nare Daily  . gabapentin  300 mg Oral q morning - 10a  . gabapentin  600 mg Oral QHS  . heparin  5,000 Units Subcutaneous Q8H  . hydrALAZINE  25 mg Oral Q8H  . insulin aspart  0-5 Units Subcutaneous QHS  . insulin aspart  0-9 Units Subcutaneous TID WC  . levothyroxine  88 mcg Oral QAC breakfast  . metoprolol succinate  100 mg Oral Daily  . multivitamin with minerals  1 tablet Oral Daily  . pantoprazole  40 mg Oral Daily  . rosuvastatin  10 mg Oral Daily   Continuous  Infusions: . sodium chloride 100 mL/hr at 02/10/18 0939  . levofloxacin (LEVAQUIN) IV Stopped (02/10/18 0130)     LOS: 1 day    Time spent: 33min    Domenic Polite, MD [facility] Page via [email or web address], password [email or web address] After 7PM please contact night-coverage  02/10/2018, 11:44 AM

## 2018-02-10 NOTE — Plan of Care (Signed)
  Problem: Activity: Goal: Risk for activity intolerance will decrease Outcome: Progressing   Problem: Elimination: Goal: Will not experience complications related to urinary retention Outcome: Progressing   Problem: Skin Integrity: Goal: Risk for impaired skin integrity will decrease Outcome: Progressing

## 2018-02-10 NOTE — ED Notes (Signed)
Patient returned from Ultrasound. 

## 2018-02-10 NOTE — H&P (Addendum)
[facility]                                                                                    Patient Demographics  Becky Hobbs, is a 72 y.o. female  CSN: [id number]  MRN: [id number]  DOB - [birth year]  Admit Date - 02/09/2018  Outpatient Primary MD for the patient is Marton Redwood, MD   With History of -  Past Medical History:  Diagnosis Date  . Anxiety    panic attacks  . Arthritis   . Asthma   . Blood transfusion    1973--with twins birth  . Diabetes mellitus    borderline  . H/O hiatal hernia    surgery fixed  . Heart murmur    MVP  . Hypertension   . Hypothyroidism       Past Surgical History:  Procedure Laterality Date  . ABDOMINAL HYSTERECTOMY    . APPENDECTOMY    . BACK SURGERY    . bladder tack    . BREAST CYST INCISION AND DRAINAGE    . BREAST SURGERY    . BUNIONECTOMY    . CHOLECYSTECTOMY    . FRACTURE SURGERY     left wrist  . LUMBAR FUSION  07/26/2017  . LUMBAR LAMINECTOMY  03/04/2012   Procedure: MICRODISCECTOMY LUMBAR LAMINECTOMY;  Surgeon: Jessy Oto, MD;  Location: [place];  Service: Orthopedics;  Laterality: N/A;  L3-4 central laminectomy  . NASAL SEPTUM SURGERY    . nissan fundoplication    . OVARIAN CYST SURGERY    . THORACIC FUSION  07/26/2017  . TONSILLECTOMY      in for   Chief Complaint  Patient presents with  . Weakness     HPI  Becky Hobbs  is a 72 y.o. female, with past medical history significant for diabetes mellitus, hypertension and renal failure due to urinary retention in the past on Cozaar and Lasix presenting withew days history of generalized fatigue and dyspnea on exertion. Patient reports decreased oral intake. No history of nausea vomiting or itching. In the emergency room the patient was found to have elevated creatinine and I was called to admit.    Review of Systems    In addition to the HPI above,  No Fever-chills, No Headache, No changes with Vision or hearing, No problems  swallowing food or Liquids, No Chest pain, Cough or Shortness of Breath, No Abdominal pain, No Nausea or Vommitting, Bowel movements are regular, No Blood in stool or Urine, No dysuria, No new skin rashes or bruises, No new joints pains-aches,  No new weakness, tingling, numbness in any extremity, No recent weight gain or loss, No polyuria, polydypsia or polyphagia, No significant Mental Stressors.  A full 10 point Review of Systems was done, except as stated above, all other Review of Systems were negative.   Social History Social History   Tobacco Use  . Smoking status: Never Smoker  . Smokeless tobacco: Never Used  Substance Use Topics  . Alcohol use: No     Family History Family History  Problem Relation Age of Onset  . Kidney disease Mother   . Vascular Disease Mother  Prior to Admission medications   Medication Sig Start Date End Date Taking? Authorizing Provider  albuterol (PROVENTIL HFA;VENTOLIN HFA) 108 (90 BASE) MCG/ACT inhaler Inhale 2 puffs into the lungs every 6 (six) hours as needed for wheezing or shortness of breath. 04/11/15  Yes Melvenia Beam, MD  allopurinol (ZYLOPRIM) 100 MG tablet Take 100 mg by mouth daily.   Yes [provider]  ALPRAZolam Duanne Moron) 0.5 MG tablet Take 0.5 mg by mouth daily as needed for anxiety.  02/13/13  Yes [provider]  aspirin EC 81 MG tablet Take 81 mg by mouth daily.   Yes [provider]  bismuth subsalicylate (PEPTO BISMOL) 262 MG/15ML suspension Take 30 mLs by mouth every 6 (six) hours as needed for indigestion or diarrhea or loose stools.   Yes [provider]  cetirizine (ZYRTEC) 10 MG tablet Take 10 mg by mouth daily.   Yes [provider]  Cholecalciferol 5000 units TABS Take 5,000 Units by mouth daily.    Yes [provider]  EPINEPHrine (EPIPEN 2-PAK) 0.3 mg/0.3 mL IJ SOAJ injection Inject 0.3 mLs (0.3 mg total) into the skin once. Patient taking differently:  Inject 0.3 mg into the skin daily as needed (allergic reaction).  04/11/15  Yes Melvenia Beam, MD  fluticasone (FLONASE) 50 MCG/ACT nasal spray INT 2 SPRAYS INTO EACH NOSTRIL as needed 08/13/16  Yes [provider]  furosemide (LASIX) 20 MG tablet Take 20 mg by mouth daily.   Yes [provider]  gabapentin (NEURONTIN) 300 MG capsule TAKE 1 CAPSULE BY MOUTH EVERY MORNING AND TAKE 2 CAPSULES EVERY NIGHT AT BEDTIME 02/03/18  Yes Jessy Oto, MD  hydrALAZINE (APRESOLINE) 25 MG tablet Take 25 mg by mouth as needed.    Yes [provider]  levothyroxine (SYNTHROID, LEVOTHROID) 88 MCG tablet Take 88 mcg by mouth daily before breakfast.    Yes [provider]  losartan (COZAAR) 100 MG tablet Take 100 mg by mouth daily.   Yes [provider]  metoprolol succinate (TOPROL-XL) 100 MG 24 hr tablet Take 100 mg by mouth daily. Take with or immediately following a meal.   Yes [provider]  Multiple Vitamin (MULITIVITAMIN WITH MINERALS) TABS Take 1 tablet by mouth daily.   Yes [provider]  ondansetron (ZOFRAN) 4 MG tablet Take 1 tablet (4 mg total) by mouth every 8 (eight) hours as needed for nausea. 04/12/13  Yes Marcial Pacas, MD  oxyCODONE-acetaminophen (PERCOCET/ROXICET) 5-325 MG per tablet Take 1 tablet by mouth every 6 (six) hours as needed for moderate pain.  01/25/15  Yes [provider]  pantoprazole (PROTONIX) 40 MG tablet Take 40 mg by mouth daily.   Yes [provider]  potassium chloride SA (K-DUR,KLOR-CON) 20 MEQ tablet Take 20 mEq by mouth daily as needed (fluid). Takes with torsemide.   Yes [provider]  rOPINIRole (REQUIP) 0.5 MG tablet Take 0.5 mg by mouth as needed.  12/14/16  Yes [provider]  rosuvastatin (CRESTOR) 10 MG tablet Take 10 mg by mouth daily.  06/30/17  Yes [provider]  SUMAtriptan (IMITREX) 100 MG tablet TAKE 1 TABLET(100 MG) BY MOUTH 1 TIME AS NEEDED FOR MIGRAINE.  MAY REPEAT IN 2 HOURS IF HEADACHE PERSISTS OR RECURS 04/19/17  Yes Melvenia Beam, MD  tiZANidine (ZANAFLEX) 2 MG tablet TAKE 1 TO 2 TABLETS BY MOUTH EVERY 6 HOURS AS NEEDED FOR SPASM 02/04/17  Yes Jessy Oto, MD  TRULICITY 1.5 LT/9.0ZE  SOPN Inject 1.5 mg as directed once a week. 01/20/18  Yes [provider]  nortriptyline (PAMELOR) 25 MG capsule Take 1 capsule (25 mg total) by mouth at bedtime. Patient not taking: Reported on 02/09/2018 06/15/17   Melvenia Beam, MD  tiZANidine (ZANAFLEX) 2 MG tablet TAKE 1 TO 2 TABLETS BY MOUTH EVERY 6 HOURS AS NEEDED FOR SPASM Patient not taking: Reported on 02/09/2018 07/19/17   Jessy Oto, MD  torsemide (DEMADEX) 20 MG tablet Take 1 tablet (20 mg total) by mouth daily as needed (fluid). For edema. Patient not taking: Reported on 02/09/2018 09/17/15   Reyne Dumas, MD  zolpidem (AMBIEN) 10 MG tablet Take 10 mg by mouth at bedtime as needed for sleep.  04/08/13   [provider]    Allergies  Allergen Reactions  . Other Anaphylaxis    bees  . Erythromycin Hives and Itching  . Hydrocodone Itching  . Morphine And Related Hives and Itching    Hives (moderate severity)  . Penicillins Hives    Has patient had a PCN reaction causing immediate rash, facial/tongue/throat swelling, SOB or lightheadedness with hypotension: No Has patient had a PCN reaction causing severe rash involving mucus membranes or skin necrosis: No Has patient had a PCN reaction that required hospitalization: No Has patient had a PCN reaction occurring within the last 10 years: No If all of the above answers are "NO", then may proceed with Cephalosporin use.  . Tramadol Hcl Itching    Physical Exam  Vitals  Blood pressure (!) 113/47, pulse 73, temperature 97.8 F (36.6 C), temperature source Oral, resp. rate 17, height 5\' 4"  (1.626 m), weight 92.1 kg (203 lb), SpO2 100 %.   1. General well-developed, well-nourished female looks drowsy  2. Normal affect and  insight, Not Suicidal or Homicidal, Awake Alert, Oriented X 3.  3. No F.N deficits,grossly, patient moving all extremities.  4. Ears and Eyes appear Normal, Conjunctivae clear, PERRLA. Moist Oral Mucosa.  5. Supple Neck, No JVD, No cervical lymphadenopathy appriciated, No Carotid Bruits.  6. Symmetrical Chest wall movement, Good air movement bilaterally, CTAB.  7. RRR, No Gallops, Rubs or Murmurs, No Parasternal Heave.  8. Positive Bowel Sounds, Abdomen Soft, Non tender, No organomegaly appriciated,No rebound -guarding or rigidity.  9.  No Cyanosis, Normal Skin Turgor, No Skin Rash or Bruise.  10. Good muscle tone,  joints appear normal , no effusions, Normal ROM.    Data Review  CBC Recent Labs  Lab 02/09/18 1738  WBC 12.1*  HGB 9.9*  HCT 30.0*  PLT 258  MCV 85.5  MCH 28.2  MCHC 33.0  RDW 14.4  LYMPHSABS 2.4  MONOABS 1.1*  EOSABS 0.6  BASOSABS 0.0   ------------------------------------------------------------------------------------------------------------------  Chemistries  Recent Labs  Lab 02/09/18 1738  NA 129*  K 3.7  CL 92*  CO2 17*  GLUCOSE 102*  BUN 105*  CREATININE 6.19*  CALCIUM 9.5  AST 96*  ALT 67*  ALKPHOS 131*  BILITOT 0.4   ------------------------------------------------------------------------------------------------------------------ estimated creatinine clearance is 9.4 mL/min (A) (by C-G formula based on SCr of 6.19 mg/dL (H)). ------------------------------------------------------------------------------------------------------------------ No results for input(s): TSH, T4TOTAL, T3FREE, THYROIDAB in the last 72 hours.  Invalid input(s): FREET3   Coagulation profile No results for input(s): INR, PROTIME in the last 168 hours. ------------------------------------------------------------------------------------------------------------------- No results for input(s): DDIMER in the last 72  hours. -------------------------------------------------------------------------------------------------------------------  Cardiac Enzymes No results for input(s): CKMB, TROPONINI, MYOGLOBIN in the last 168 hours.  Invalid input(s): CK ------------------------------------------------------------------------------------------------------------------  Invalid input(s): POCBNP   ---------------------------------------------------------------------------------------------------------------  Urinalysis    Component Value Date/Time   COLORURINE YELLOW 02/09/2018 2203   APPEARANCEUR HAZY (A) 02/09/2018 2203   LABSPEC 1.006 02/09/2018 2203   PHURINE 5.0 02/09/2018 2203   GLUCOSEU NEGATIVE 02/09/2018 2203   HGBUR SMALL (A) 02/09/2018 2203   BILIRUBINUR NEGATIVE 02/09/2018 2203   KETONESUR NEGATIVE 02/09/2018 2203   PROTEINUR NEGATIVE 02/09/2018 2203   UROBILINOGEN 1.0 03/02/2012 1332   NITRITE NEGATIVE 02/09/2018 2203   LEUKOCYTESUR LARGE (A) 02/09/2018 2203    ----------------------------------------------------------------------------------------------------------------    Imaging results:   Dg Chest Port 1 View  Result Date: 02/09/2018 CLINICAL DATA:  Dyspnea EXAM: PORTABLE CHEST 1 VIEW COMPARISON:  08/01/2017 FINDINGS: The heart size and mediastinal contours are within normal limits. Both lungs are clear. The included visualized lower thoracic spinal fusion hardware is stable. IMPRESSION: No active disease. Electronically Signed   By: Ashley Royalty M.D.   On: 02/09/2018 22:21      Assessment & Plan  1. Acute renal failure; multifactorial     ARB , Lasix and poor oral intake 2. Urinary tract infection  Plan  IV hydration Hold ARB and Lasix Consult nephrology in a.m./Dr. Kathe Mariner IV Levaquin   DVT Prophylaxis Heparin   AM Labs Ordered, also please review Full Orders  Family Communication: Admission, patients condition and plan of care including tests being ordered  have been discussed with the patient and amily who indicate understanding and agree with the plan and Code Status.  Code Status Full  Disposition Plan: home  Time spent in minutes : 43 minutes  Condition GUARDED   @SIGNATURE @

## 2018-02-10 NOTE — ED Notes (Signed)
Patient transported to Ultrasound 

## 2018-02-10 NOTE — Progress Notes (Signed)
Pharmacy Antibiotic Note  Becky Hobbs is a 72 y.o. female admitted on 02/09/2018 with UTI.  Pharmacy has been consulted for levaquin dosing.  Plan: Levaquin 500 mg IV q48 hours F/u renal function, cultures and clinical course  Height: 5\' 4"  (162.6 cm) Weight: 203 lb (92.1 kg) IBW/kg (Calculated) : 54.7  Temp (24hrs), Avg:97.8 F (36.6 C), Min:97.8 F (36.6 C), Max:97.8 F (36.6 C)  Recent Labs  Lab 02/09/18 1738 02/09/18 2051  WBC 12.1*  --   CREATININE 6.19*  --   LATICACIDVEN  --  0.99    Estimated Creatinine Clearance: 9.4 mL/min (A) (by C-G formula based on SCr of 6.19 mg/dL (H)).    Allergies  Allergen Reactions  . Other Anaphylaxis    bees  . Erythromycin Hives and Itching  . Hydrocodone Itching  . Morphine And Related Hives and Itching    Hives (moderate severity)  . Penicillins Hives    Has patient had a PCN reaction causing immediate rash, facial/tongue/throat swelling, SOB or lightheadedness with hypotension: No Has patient had a PCN reaction causing severe rash involving mucus membranes or skin necrosis: No Has patient had a PCN reaction that required hospitalization: No Has patient had a PCN reaction occurring within the last 10 years: No If all of the above answers are "NO", then may proceed with Cephalosporin use.  . Tramadol Hcl Itching   Thank you for allowing pharmacy to be a part of this patient's care.  Excell Seltzer Poteet 02/10/2018 12:07 AM

## 2018-02-11 LAB — BASIC METABOLIC PANEL
Anion gap: 12 (ref 5–15)
BUN: 76 mg/dL — ABNORMAL HIGH (ref 6–20)
CO2: 19 mmol/L — ABNORMAL LOW (ref 22–32)
Calcium: 9.2 mg/dL (ref 8.9–10.3)
Chloride: 106 mmol/L (ref 101–111)
Creatinine, Ser: 3.53 mg/dL — ABNORMAL HIGH (ref 0.44–1.00)
GFR calc Af Amer: 14 mL/min — ABNORMAL LOW (ref >60)
GFR calc non Af Amer: 12 mL/min — ABNORMAL LOW (ref >60)
Glucose, Bld: 98 mg/dL (ref 65–99)
Potassium: 4.2 mmol/L (ref 3.5–5.1)
Sodium: 137 mmol/L (ref 135–145)

## 2018-02-11 LAB — GLUCOSE, CAPILLARY
Glucose-Capillary: 99 mg/dL (ref 65–99)
Glucose-Capillary: 117 mg/dL — ABNORMAL HIGH (ref 65–99)
Glucose-Capillary: 116 mg/dL — ABNORMAL HIGH (ref 65–99)
Glucose-Capillary: 134 mg/dL — ABNORMAL HIGH (ref 65–99)

## 2018-02-11 MED ORDER — GABAPENTIN 300 MG PO CAPS
300.0000 mg | ORAL_CAPSULE | Freq: Every day | ORAL | Status: DC
  Administered 2018-02-11 – 2018-02-13 (×3): 300 mg via ORAL
  Filled 2018-02-11 (×4): qty 1

## 2018-02-11 NOTE — Progress Notes (Signed)
PROGRESS NOTE    Becky Hobbs  KDX:[id number] DOB: [birth year] DOA: 02/09/2018 PCP: Marton Redwood, MD  Brief Narrative:Becky Hobbs is a 72 year old female history of multiple spinal surgeries, diabetes mellitus, CKD3, hypertension, presented to the emergency room due to weakness, found to have borderline hypotension and acute renal failure. -No fevers or chills   Assessment & Plan:   Active Problems:   Acute renal failure (ARF) (HCC) -suspect Becky is prerenal in etiology secondary to poor by mouth intake, diuretic use, worsened by ARB -Creatinine at baseline is around 1.5-1.7 From 2016 -Creatinine improving with IV fluids, continue IV fluids today, down from 6.2 to 3.5 now -Renal ultrasound-no hydronephrosis -avoid nephrotoxins, renally dose medications -monitor bmet daily -ambulate, PT/OT  Possible UTI -Continue levofloxacin, follow-up urine cultures, still pending  Diabetes mellitus -Hold trulicity, sliding scale insulin for now  Hypertension -BP improving, diuretics and ARB on hold -hold hydralazine  Chronic incontinence of bowel and bladder -Following numerous spinal surgeries -Due to follow-up with GYN oncology for surgical repair  DVT prophylaxis: Hep SQ Code Status: Full Code Family Communication: None at bedside Disposition Plan: Home in 48hours  Consultants:   none   Procedures:   Antimicrobials:  Antibiotics Given (last 72 hours)    Date/Time Action Medication Dose Rate   02/10/18 0030 New Bag/Given   levofloxacin (LEVAQUIN) IVPB 500 mg 500 mg 100 mL/hr      Subjective: -improving, appetite better  Objective: Vitals:   02/10/18 2252 02/10/18 2254 02/11/18 0614 02/11/18 1136  BP: (!) 110/44 (!) 112/45 (!) 99/41 (!) 127/45  Pulse:   75 71  Resp:   18   Temp:   98.4 F (36.9 C)   TempSrc:   Oral   SpO2:   99%   Weight:      Height:        Intake/Output Summary (Last 24 hours) at 02/11/2018 1301 Last data filed at 02/11/2018 1135 Gross per  24 hour  Intake 1200 ml  Output 3650 ml  Net -2450 ml   Filed Weights   02/09/18 1731  Weight: 92.1 kg (203 lb)    Examination:  Gen: Awake, Alert, Oriented X 3, chronically ill appearing HEENT: PERRLA, Neck supple, no JVD Lungs: Good air movement bilaterally, CTAB CVS: RRR,No Gallops,Rubs or new Murmurs Abd: soft, Non tender, non distended, BS present Extremities: No Cyanosis, Clubbing or edema Skin: no new rashes Psychiatry: Judgement and insight appear normal. Mood & affect appropriate.     Data Reviewed:   CBC: Recent Labs  Lab 02/09/18 1738  WBC 12.1*  NEUTROABS 8.0*  HGB 9.9*  HCT 30.0*  MCV 85.5  PLT 976   Basic Metabolic Panel: Recent Labs  Lab 02/09/18 1738 02/10/18 0424 02/11/18 0803  NA 129* 134* 137  K 3.7 3.7 4.2  CL 92* 100* 106  CO2 17* 19* 19*  GLUCOSE 102* 98 98  BUN 105* 100* 76*  CREATININE 6.19* 5.04* 3.53*  CALCIUM 9.5 9.0 9.2   GFR: Estimated Creatinine Clearance: 16.6 mL/min (A) (by C-G formula based on SCr of 3.53 mg/dL (H)). Liver Function Tests: Recent Labs  Lab 02/09/18 1738  AST 96*  ALT 67*  ALKPHOS 131*  BILITOT 0.4  PROT 8.2*  ALBUMIN 3.7   No results for input(s): LIPASE, AMYLASE in the last 168 hours. No results for input(s): AMMONIA in the last 168 hours. Coagulation Profile: No results for input(s): INR, PROTIME in the last 168 hours. Cardiac Enzymes: No results for input(s): CKTOTAL, CKMB,  CKMBINDEX, TROPONINI in the last 168 hours. BNP (last 3 results) No results for input(s): PROBNP in the last 8760 hours. HbA1C: No results for input(s): HGBA1C in the last 72 hours. CBG: Recent Labs  Lab 02/10/18 0822 02/10/18 1136 02/10/18 1630 02/10/18 2157 02/11/18 0823  GLUCAP 83 109* 116* 129* 99   Lipid Profile: No results for input(s): CHOL, HDL, LDLCALC, TRIG, CHOLHDL, LDLDIRECT in the last 72 hours. Thyroid Function Tests: No results for input(s): TSH, T4TOTAL, FREET4, T3FREE, THYROIDAB in the last 72  hours. Anemia Panel: No results for input(s): VITAMINB12, FOLATE, FERRITIN, TIBC, IRON, RETICCTPCT in the last 72 hours. Urine analysis:    Component Value Date/Time   COLORURINE YELLOW 02/09/2018 2203   APPEARANCEUR HAZY (A) 02/09/2018 2203   LABSPEC 1.006 02/09/2018 2203   PHURINE 5.0 02/09/2018 2203   GLUCOSEU NEGATIVE 02/09/2018 2203   HGBUR SMALL (A) 02/09/2018 2203   BILIRUBINUR NEGATIVE 02/09/2018 2203   KETONESUR NEGATIVE 02/09/2018 2203   PROTEINUR NEGATIVE 02/09/2018 2203   UROBILINOGEN 1.0 03/02/2012 1332   NITRITE NEGATIVE 02/09/2018 2203   LEUKOCYTESUR LARGE (A) 02/09/2018 2203   Sepsis Labs: @LABRCNTIP (procalcitonin:4,lacticidven:4)  )No results found for Becky or any previous visit (from the past 240 hour(s)).       Radiology Studies: US Renal  Result Date: 02/10/2018 CLINICAL DATA:  Acute kidney injury. EXAM: RENAL / URINARY TRACT ULTRASOUND COMPLETE COMPARISON:  08/01/2017 CT FINDINGS: Right Kidney: Length: 9.7 cm. Mild renal cortical thinning. Normal renal echogenicity. No hydronephrosis. Fetal lobulation. Left Kidney: Length: 10.5 cm. Mild renal cortical thinning. Normal renal echogenicity. Lower pole 1.8 cm left renal cyst. Bladder: Foley catheter identified within. IMPRESSION: 1.  No hydronephrosis. 2. Renal cortical thinning, suggesting medical renal disease. Electronically Signed   By: Abigail Miyamoto M.D.   On: 02/10/2018 13:37   Dg Chest Port 1 View  Result Date: 02/09/2018 CLINICAL DATA:  Dyspnea EXAM: PORTABLE CHEST 1 VIEW COMPARISON:  08/01/2017 FINDINGS: The heart size and mediastinal contours are within normal limits. Both lungs are clear. The included visualized lower thoracic spinal fusion hardware is stable. IMPRESSION: No active disease. Electronically Signed   By: Ashley Royalty M.D.   On: 02/09/2018 22:21        Scheduled Meds: . allopurinol  100 mg Oral Daily  . aspirin EC  81 mg Oral Daily  . cholecalciferol  5,000 Units Oral Daily  .  fluticasone  1 spray Each Nare Daily  . gabapentin  300 mg Oral QHS  . heparin  5,000 Units Subcutaneous Q8H  . hydrALAZINE  25 mg Oral Q8H  . insulin aspart  0-5 Units Subcutaneous QHS  . insulin aspart  0-9 Units Subcutaneous TID WC  . levothyroxine  88 mcg Oral QAC breakfast  . metoprolol succinate  100 mg Oral Daily  . multivitamin with minerals  1 tablet Oral Daily  . pantoprazole  40 mg Oral Daily  . rosuvastatin  10 mg Oral Daily   Continuous Infusions: . sodium chloride 100 mL/hr at 02/11/18 1206  . levofloxacin (LEVAQUIN) IV Stopped (02/10/18 0130)     LOS: 2 days    Time spent: 61min    Domenic Polite, MD [facility] Page via [email or web address], password [email or web address] After 7PM please contact night-coverage  02/11/2018, 1:01 PM

## 2018-02-11 NOTE — Evaluation (Signed)
Physical Therapy Evaluation Patient Details Name: Becky Hobbs MRN: [id number] DOB: 01/30/1946 Today's Date: 02/11/2018   History of Present Illness  Pt is a 72 y/o female who presents with weakness, dizziness, and decreased PO intake. She was found to be in acute renal failure and possibly with a UTI. PMH significant for multiple spinal surgeries, DM, CKDIII.  Clinical Impression  Pt admitted with above diagnosis. Pt currently with functional limitations due to the deficits listed below (see PT Problem List). At baseline, pt and family report pt is active and independent. During PT evaluation, pt required up to mod assist to complete transfers and ambulate 15' with a RW for support. Mobility tech present during session for assistance, and feel continued OOB with mobility tech will be beneficial in addition to therapy sessions. Pt overall with decreased strength and tolerance for functional activity, and is at a high risk of falls. Will require assistance at home and HHPT to follow up at d/c. Acutely, pt will benefit from skilled PT to increase their independence and safety with mobility to allow discharge to the venue listed below.       Follow Up Recommendations Home health PT;Supervision/Assistance - 24 hour    Equipment Recommendations  None recommended by PT    Recommendations for Other Services       Precautions / Restrictions Precautions Precautions: Fall Restrictions Weight Bearing Restrictions: No      Mobility  Bed Mobility Overal bed mobility: Needs Assistance Bed Mobility: Rolling;Sidelying to Sit Rolling: Min assist Sidelying to sit: Mod assist       General bed mobility comments: Bed pad and heavy mod assist required for pt to roll and elevate trunk to full sitting position. VC's for use of rails and sequencing.  Transfers Overall transfer level: Needs assistance Equipment used: Rolling walker (2 wheeled) Transfers: Sit to/from Stand Sit to Stand: Min assist          General transfer comment: Assist to power-up to full standing position. Pt was cued for hand placement on seated surface for safety.   Ambulation/Gait Ambulation/Gait assistance: Min assist;+2 physical assistance;+2 safety/equipment Ambulation Distance (Feet): 15 Feet Assistive device: Rolling walker (2 wheeled) Gait Pattern/deviations: Step-through pattern;Decreased stride length;Trunk flexed;Leaning posteriorly Gait velocity: Decreased Gait velocity interpretation: <1.8 ft/sec, indicate of risk for recurrent falls General Gait Details: Pt was able to march EOB prior to initiating gait training. Noted some knee buckling in standing, so +2 assist present for safety. Pt grossly moving slow with 2 instances of requiring min assist for balance support.  Stairs            Wheelchair Mobility    Modified Rankin (Stroke Patients Only)       Balance Overall balance assessment: Needs assistance Sitting-balance support: Feet supported;No upper extremity supported Sitting balance-Leahy Scale: Poor Sitting balance - Comments: Requires UE support to maintain sitting balance.  Postural control: Posterior lean Standing balance support: Bilateral upper extremity supported;During functional activity Standing balance-Leahy Scale: Poor                               Pertinent Vitals/Pain Pain Assessment: No/denies pain    Home Living Family/patient expects to be discharged to:: Private residence Living Arrangements: Alone Available Help at Discharge: Family;Available 24 hours/day Type of Home: House Home Access: Stairs to enter Entrance Stairs-Rails: Psychiatric nurse of Steps: 3-4 Home Layout: Two level;1/2 bath on main level Home Equipment: Clinical cytogeneticist -  2 wheels;Cane - single point      Prior Function Level of Independence: Independent with assistive device(s)         Comments: Used the walker prior to admission for balance. Pt  reports no falls at home however has had near-falls     Hand Dominance   Dominant Hand: Right    Extremity/Trunk Assessment   Upper Extremity Assessment Upper Extremity Assessment: Generalized weakness;RUE deficits/detail RUE Deficits / Details: Pt denies sensation changes. Bilaterally, shoulder flexion 3+/5, biceps/triceps 4-/5, grip weak.     Lower Extremity Assessment Lower Extremity Assessment: Generalized weakness;RLE deficits/detail RLE Deficits / Details: Pt denies sensation changes. Bilaterally, hip flexion 3/5, quads 4-/5, hamstrings 4/5. RLE Sensation: WNL    Cervical / Trunk Assessment Cervical / Trunk Assessment: Normal;Other exceptions Cervical / Trunk Exceptions: Forward head/rounded shoulders posture  Communication   Communication: No difficulties  Cognition Arousal/Alertness: Lethargic;Suspect due to medications Behavior During Therapy: Flat affect Overall Cognitive Status: Impaired/Different from baseline Area of Impairment: Attention;Memory;Following commands;Safety/judgement;Awareness;Problem solving                   Current Attention Level: Selective Memory: Decreased short-term memory Following Commands: Follows one step commands consistently;Follows one step commands with increased time;Follows multi-step commands inconsistently Safety/Judgement: Decreased awareness of safety;Decreased awareness of deficits Awareness: Emergent Problem Solving: Slow processing;Decreased initiation;Difficulty sequencing;Requires verbal cues General Comments: Pt with delayed responses and appears confused at times even though she is oriented. Requires repetition of       General Comments      Exercises     Assessment/Plan    PT Assessment Patient needs continued PT services  PT Problem List Decreased strength;Decreased range of motion;Decreased activity tolerance;Decreased balance;Decreased mobility;Decreased coordination;Decreased knowledge of use of  DME;Decreased safety awareness;Decreased knowledge of precautions;Decreased cognition       PT Treatment Interventions DME instruction;Gait training;Stair training;Functional mobility training;Therapeutic activities;Therapeutic exercise;Neuromuscular re-education;Patient/family education    PT Goals (Current goals can be found in the Care Plan section)  Acute Rehab PT Goals Patient Stated Goal: Get stronger PT Goal Formulation: With patient/family Time For Goal Achievement: 02/18/18 Potential to Achieve Goals: Good    Frequency Min 3X/week   Barriers to discharge        Co-evaluation               AM-PAC PT "6 Clicks" Daily Activity  Outcome Measure Difficulty turning over in bed (including adjusting bedclothes, sheets and blankets)?: Unable Difficulty moving from lying on back to sitting on the side of the bed? : Unable Difficulty sitting down on and standing up from a chair with arms (e.g., wheelchair, bedside commode, etc,.)?: Unable Help needed moving to and from a bed to chair (including a wheelchair)?: A Little Help needed walking in hospital room?: A Little Help needed climbing 3-5 steps with a railing? : Total 6 Click Score: 10    End of Session Equipment Utilized During Treatment: Gait belt Activity Tolerance: Patient limited by fatigue(dizziness) Patient left: in chair;with call bell/phone within reach;with chair alarm set;with nursing/sitter in room Nurse Communication: Mobility status PT Visit Diagnosis: Unsteadiness on feet (R26.81);Difficulty in walking, not elsewhere classified (R26.2)    Time: 7824-2353 PT Time Calculation (min) (ACUTE ONLY): 44 min   Charges:   PT Evaluation $PT Eval Moderate Complexity: 1 Mod PT Treatments $Gait Training: 23-37 mins   PT G Codes:        Rolinda Roan, PT, DPT Acute Rehabilitation Services Pager: [phone]   Thelma Comp 02/11/2018,  1:01 PM

## 2018-02-11 NOTE — Care Management Note (Signed)
Case Management Note  Patient Details  Name: Becky Hobbs MRN: [id number] Date of Birth: [birth year]  Subjective/Objective:                    Action/Plan:  PT recommendations for home health PT . Attempted to discuss with patient. Patient drowsy , left list of home health providers at bedside. Will follow up   Will need home health orders and face to face. Expected Discharge Date:                  Expected Discharge Plan:  [place]  In-House Referral:     Discharge planning Services  CM Consult  Post Acute Care Choice:  Home Health Choice offered to:  Patient  DME Arranged:    DME Agency:  NA  HH Arranged:  PT HH Agency:     Status of Service:  In process, will continue to follow  If discussed at Long Length of Stay Meetings, dates discussed:    Additional Comments:  Marilu Favre, RN 02/11/2018, 3:43 PM

## 2018-02-12 LAB — CBC
HCT: 26.6 % — ABNORMAL LOW (ref 36.0–46.0)
Hemoglobin: 8.5 g/dL — ABNORMAL LOW (ref 12.0–15.0)
MCH: 27.5 pg (ref 26.0–34.0)
MCHC: 32 g/dL (ref 30.0–36.0)
MCV: 86.1 fL (ref 78.0–100.0)
Platelets: 229 10*3/uL (ref 150–400)
RBC: 3.09 MIL/uL — ABNORMAL LOW (ref 3.87–5.11)
RDW: 14.4 % (ref 11.5–15.5)
WBC: 7.3 10*3/uL (ref 4.0–10.5)

## 2018-02-12 LAB — GLUCOSE, CAPILLARY
Glucose-Capillary: 90 mg/dL (ref 65–99)
Glucose-Capillary: 126 mg/dL — ABNORMAL HIGH (ref 65–99)
Glucose-Capillary: 115 mg/dL — ABNORMAL HIGH (ref 65–99)
Glucose-Capillary: 116 mg/dL — ABNORMAL HIGH (ref 65–99)

## 2018-02-12 LAB — BASIC METABOLIC PANEL
Anion gap: 10 (ref 5–15)
BUN: 60 mg/dL — ABNORMAL HIGH (ref 6–20)
CO2: 20 mmol/L — ABNORMAL LOW (ref 22–32)
Calcium: 9.2 mg/dL (ref 8.9–10.3)
Chloride: 107 mmol/L (ref 101–111)
Creatinine, Ser: 2.81 mg/dL — ABNORMAL HIGH (ref 0.44–1.00)
GFR calc Af Amer: 19 mL/min — ABNORMAL LOW (ref >60)
GFR calc non Af Amer: 16 mL/min — ABNORMAL LOW (ref >60)
Glucose, Bld: 102 mg/dL — ABNORMAL HIGH (ref 65–99)
Potassium: 4.6 mmol/L (ref 3.5–5.1)
Sodium: 137 mmol/L (ref 135–145)

## 2018-02-12 NOTE — Progress Notes (Signed)
PROGRESS NOTE    Becky Hobbs  HMC:[id number] DOB: 1946-09-08 DOA: 02/09/2018 PCP: Becky Redwood, MD  Brief Narrative:this [name] is a 72 year old female history of multiple spinal surgeries, diabetes mellitus, CKD3, hypertension, presented to the emergency room due to weakness, found to have borderline hypotension and acute renal failure. -No fevers or chills -improving with IVF  Assessment & Plan:   Active Problems:   Acute renal failure (ARF) (HCC) -suspect this is prerenal in etiology secondary to poor by mouth intake, diuretic use, worsened by ARB -Creatinine at baseline is around 1.5-1.7 From 2016 -Creatinine improving with IV fluids, down from 6.2 to 2.8 now -Renal ultrasound-no hydronephrosis -avoid nephrotoxins, renally dose medications -monitor bmet daily, continue IV fluids, cutdown rate to 75 mL an hour -ambulate, PT/OT -home with home health services tomorrow  Possible UTI -treated with IV Levaquin, and unfortunately urine cultures were never sent -DC levaquin after todays dose, completed 3days of therapy  Diabetes mellitus -Hold trulicity, sliding scale insulin for now  Hypertension -BP improving, diuretics and ARB on hold -hold hydralazine  Chronic incontinence of bowel and bladder -Following numerous spinal surgeries -Due to follow-up with GYN oncology for surgical repair  DVT prophylaxis: Hep SQ Code Status: Full Code Family Communication: None at bedside Disposition Plan: home tomorrow if creatinine continues to trend down further  Consultants:   none   Procedures:   Antimicrobials:  Antibiotics Given (last 72 hours)    Date/Time Action Medication Dose Rate   02/10/18 0030 New Bag/Given   levofloxacin (LEVAQUIN) IVPB 500 mg 500 mg 100 mL/hr   02/12/18 0002 New Bag/Given   levofloxacin (LEVAQUIN) IVPB 500 mg 500 mg 100 mL/hr      Subjective: -still a little weak and unsteady however overall improving  Objective: Vitals:   02/11/18 1136  02/11/18 1328 02/11/18 2227 02/12/18 0453  BP: (!) 127/45 (!) 118/48 (!) 126/43 (!) 128/47  Pulse: 71 66 73 67  Resp:  18 17 17   Temp:  98.2 F (36.8 C) 97.8 F (36.6 C) 97.9 F (36.6 C)  TempSrc:  Oral Oral Oral  SpO2:  100% 100% 100%  Weight:      Height:        Intake/Output Summary (Last 24 hours) at 02/12/2018 1141 Last data filed at 02/12/2018 1100 Gross per 24 hour  Intake 2065 ml  Output 1900 ml  Net 165 ml   Filed Weights   02/09/18 1731  Weight: 92.1 kg (203 lb)    Examination:  Gen: Awake, Alert, Oriented X 3, chronically ill appearing HEENT: PERRLA, Neck supple, no JVD Lungs: Good air movement bilaterally, CTAB CVS: RRR,No Gallops,Rubs or new Murmurs Abd: obese, soft, Non tender, non distended, BS present Extremities: No Cyanosis, Clubbing or edema Skin: no new rashes psychiatry: Judgement and insight appear normal. Mood & affect appropriate.     Data Reviewed:   CBC: Recent Labs  Lab 02/09/18 1738 02/12/18 0430  WBC 12.1* 7.3  NEUTROABS 8.0*  --   HGB 9.9* 8.5*  HCT 30.0* 26.6*  MCV 85.5 86.1  PLT 258 662   Basic Metabolic Panel: Recent Labs  Lab 02/09/18 1738 02/10/18 0424 02/11/18 0803 02/12/18 0430  NA 129* 134* 137 137  K 3.7 3.7 4.2 4.6  CL 92* 100* 106 107  CO2 17* 19* 19* 20*  GLUCOSE 102* 98 98 102*  BUN 105* 100* 76* 60*  CREATININE 6.19* 5.04* 3.53* 2.81*  CALCIUM 9.5 9.0 9.2 9.2   GFR: Estimated Creatinine Clearance: 20.8  mL/min (A) (by C-G formula based on SCr of 2.81 mg/dL (H)). Liver Function Tests: Recent Labs  Lab 02/09/18 1738  AST 96*  ALT 67*  ALKPHOS 131*  BILITOT 0.4  PROT 8.2*  ALBUMIN 3.7   No results for input(s): LIPASE, AMYLASE in the last 168 hours. No results for input(s): AMMONIA in the last 168 hours. Coagulation Profile: No results for input(s): INR, PROTIME in the last 168 hours. Cardiac Enzymes: No results for input(s): CKTOTAL, CKMB, CKMBINDEX, TROPONINI in the last 168 hours. BNP (last  3 results) No results for input(s): PROBNP in the last 8760 hours. HbA1C: No results for input(s): HGBA1C in the last 72 hours. CBG: Recent Labs  Lab 02/11/18 0823 02/11/18 1302 02/11/18 1728 02/11/18 2119 02/12/18 0734  GLUCAP 99 117* 116* 134* 90   Lipid Profile: No results for input(s): CHOL, HDL, LDLCALC, TRIG, CHOLHDL, LDLDIRECT in the last 72 hours. Thyroid Function Tests: No results for input(s): TSH, T4TOTAL, FREET4, T3FREE, THYROIDAB in the last 72 hours. Anemia Panel: No results for input(s): VITAMINB12, FOLATE, FERRITIN, TIBC, IRON, RETICCTPCT in the last 72 hours. Urine analysis:    Component Value Date/Time   COLORURINE YELLOW 02/09/2018 2203   APPEARANCEUR HAZY (A) 02/09/2018 2203   LABSPEC 1.006 02/09/2018 2203   PHURINE 5.0 02/09/2018 2203   GLUCOSEU NEGATIVE 02/09/2018 2203   HGBUR SMALL (A) 02/09/2018 2203   BILIRUBINUR NEGATIVE 02/09/2018 2203   KETONESUR NEGATIVE 02/09/2018 2203   PROTEINUR NEGATIVE 02/09/2018 2203   UROBILINOGEN 1.0 03/02/2012 1332   NITRITE NEGATIVE 02/09/2018 2203   LEUKOCYTESUR LARGE (A) 02/09/2018 2203   Sepsis Labs: @LABRCNTIP (procalcitonin:4,lacticidven:4)  )No results found for this or any previous visit (from the past 240 hour(s)).       Radiology Studies: US Renal  Result Date: 02/10/2018 CLINICAL DATA:  Acute kidney injury. EXAM: RENAL / URINARY TRACT ULTRASOUND COMPLETE COMPARISON:  08/01/2017 CT FINDINGS: Right Kidney: Length: 9.7 cm. Mild renal cortical thinning. Normal renal echogenicity. No hydronephrosis. Fetal lobulation. Left Kidney: Length: 10.5 cm. Mild renal cortical thinning. Normal renal echogenicity. Lower pole 1.8 cm left renal cyst. Bladder: Foley catheter identified within. IMPRESSION: 1.  No hydronephrosis. 2. Renal cortical thinning, suggesting medical renal disease. Electronically Signed   By: Abigail Miyamoto M.D.   On: 02/10/2018 13:37        Scheduled Meds: . allopurinol  100 mg Oral Daily  .  aspirin EC  81 mg Oral Daily  . cholecalciferol  5,000 Units Oral Daily  . fluticasone  1 spray Each Nare Daily  . gabapentin  300 mg Oral QHS  . heparin  5,000 Units Subcutaneous Q8H  . insulin aspart  0-5 Units Subcutaneous QHS  . insulin aspart  0-9 Units Subcutaneous TID WC  . levothyroxine  88 mcg Oral QAC breakfast  . metoprolol succinate  100 mg Oral Daily  . multivitamin with minerals  1 tablet Oral Daily  . pantoprazole  40 mg Oral Daily  . rosuvastatin  10 mg Oral Daily   Continuous Infusions: . sodium chloride 75 mL/hr at 02/12/18 0923  . levofloxacin (LEVAQUIN) IV Stopped (02/12/18 0102)     LOS: 3 days    Time spent: 53min    Domenic Polite, MD [facility] Page via [email or web address], password [email or web address] After 7PM please contact night-coverage  02/12/2018, 11:41 AM

## 2018-02-13 LAB — BASIC METABOLIC PANEL
Anion gap: 9 (ref 5–15)
BUN: 46 mg/dL — ABNORMAL HIGH (ref 6–20)
CO2: 21 mmol/L — ABNORMAL LOW (ref 22–32)
Calcium: 9.5 mg/dL (ref 8.9–10.3)
Chloride: 109 mmol/L (ref 101–111)
Creatinine, Ser: 2.42 mg/dL — ABNORMAL HIGH (ref 0.44–1.00)
GFR calc Af Amer: 22 mL/min — ABNORMAL LOW (ref >60)
GFR calc non Af Amer: 19 mL/min — ABNORMAL LOW (ref >60)
Glucose, Bld: 110 mg/dL — ABNORMAL HIGH (ref 65–99)
Potassium: 4.9 mmol/L (ref 3.5–5.1)
Sodium: 139 mmol/L (ref 135–145)

## 2018-02-13 LAB — GLUCOSE, CAPILLARY
Glucose-Capillary: 90 mg/dL (ref 65–99)
Glucose-Capillary: 102 mg/dL — ABNORMAL HIGH (ref 65–99)
Glucose-Capillary: 114 mg/dL — ABNORMAL HIGH (ref 65–99)
Glucose-Capillary: 114 mg/dL — ABNORMAL HIGH (ref 65–99)

## 2018-02-13 LAB — CBC
HCT: 27.2 % — ABNORMAL LOW (ref 36.0–46.0)
Hemoglobin: 8.7 g/dL — ABNORMAL LOW (ref 12.0–15.0)
MCH: 27.4 pg (ref 26.0–34.0)
MCHC: 32 g/dL (ref 30.0–36.0)
MCV: 85.5 fL (ref 78.0–100.0)
Platelets: 259 10*3/uL (ref 150–400)
RBC: 3.18 MIL/uL — ABNORMAL LOW (ref 3.87–5.11)
RDW: 14.1 % (ref 11.5–15.5)
WBC: 8.4 10*3/uL (ref 4.0–10.5)

## 2018-02-13 MED ORDER — SENNA 8.6 MG PO TABS
1.0000 | ORAL_TABLET | Freq: Two times a day (BID) | ORAL | Status: DC
  Administered 2018-02-13 – 2018-02-14 (×2): 8.6 mg via ORAL
  Filled 2018-02-13 (×3): qty 1

## 2018-02-13 NOTE — Evaluation (Signed)
Occupational Therapy Evaluation Patient Details Name: Becky Hobbs MRN: [id number] DOB: Oct 03, 1946 Today's Date: 02/13/2018    History of Present Illness Pt is a 72 y/o female who presents with weakness, dizziness, and decreased PO intake. She was found to be in acute renal failure and possibly with a UTI. PMH significant for multiple spinal surgeries, DM, CKDIII.   Clinical Impression   Pt admitted with above. She demonstrates the below listed deficits and will benefit from continued OT to maximize safety and independence with BADLs.  Pt presents to OT with generalized weakness, impaired balance, ? Cognitive deficit, decreased activity tolerance.  She currently requires min A, overall for ADLs and min guard assist with functional mobility.  She lives alone, but reports her sister will be staying with her at discharge and family can assist as needed.  She has all needed DME.       Follow Up Recommendations  No OT follow up;Supervision/Assistance - 24 hour    Equipment Recommendations  None recommended by OT    Recommendations for Other Services       Precautions / Restrictions Precautions Precautions: Fall      Mobility Bed Mobility               General bed mobility comments: sitting up in chair   Transfers Overall transfer level: Needs assistance Equipment used: Rolling walker (2 wheeled) Transfers: Sit to/from [facility] Sit to Stand: Min guard Stand pivot transfers: Min guard       General transfer comment: min guard for safety     Balance Overall balance assessment: Needs assistance Sitting-balance support: Feet supported;No upper extremity supported Sitting balance-Leahy Scale: Good     Standing balance support: No upper extremity supported;During functional activity Standing balance-Leahy Scale: Fair                             ADL either performed or assessed with clinical judgement   ADL Overall ADL's : Needs  assistance/impaired Eating/Feeding: Independent   Grooming: Wash/dry hands;Wash/dry face;Oral care;Brushing hair;Min guard;Standing   Upper Body Bathing: Set up;Supervision/ safety;Sitting   Lower Body Bathing: Minimal assistance;Sit to/from stand Lower Body Bathing Details (indicate cue type and reason): uses AE at home Upper Body Dressing : Set up   Lower Body Dressing: Moderate assistance;Sit to/from stand Lower Body Dressing Details (indicate cue type and reason): uses AE at home Toilet Transfer: Min guard;Ambulation;Comfort height toilet;Grab bars;RW Armed forces technical officer Details (indicate cue type and reason): Pt mildly unsteady  Toileting- Clothing Manipulation and Hygiene: Min guard;Sit to/from stand       Functional mobility during ADLs: Min guard;Rolling walker       Vision Baseline Vision/History: Wears glasses Wears Glasses: At all times Patient Visual Report: No change from baseline       Perception     Praxis      Pertinent Vitals/Pain Pain Assessment: No/denies pain     Hand Dominance Right   Extremity/Trunk Assessment Upper Extremity Assessment Upper Extremity Assessment: Generalized weakness   Lower Extremity Assessment Lower Extremity Assessment: Defer to PT evaluation       Communication Communication Communication: No difficulties   Cognition Arousal/Alertness: Awake/alert Behavior During Therapy: WFL for tasks assessed/performed Overall Cognitive Status: No family/caregiver present to determine baseline cognitive functioning  General Comments: Pt occasionally repeats self and asks same questions    General Comments       Exercises     Shoulder Instructions      Home Living Family/patient expects to be discharged to:: Private residence Living Arrangements: Alone Available Help at Discharge: Family;Available 24 hours/day Type of Home: House Home Access: Stairs to enter [employer] of Steps: 3-4 Entrance Stairs-Rails: Right;Left Home Layout: Two level;1/2 bath on main level Alternate Level Stairs-Number of Steps: flight Alternate Level Stairs-Rails: Left;Right Bathroom Shower/Tub: Occupational psychologist: Standard Bathroom Accessibility: Yes   Home Equipment: Clinical cytogeneticist - 2 wheels;Cane - single point;Bedside commode;Adaptive equipment Adaptive Equipment: Reacher;Sock aid;Long-handled shoe horn;Long-handled sponge Additional Comments: sister will be staying with her at discharge       Prior Functioning/Environment Level of Independence: Independent with assistive device(s)        Comments: Used the walker prior to admission for balance. Pt reports no falls at home however has had near-falls        OT Problem List: Decreased strength;Decreased activity tolerance;Impaired balance (sitting and/or standing);Decreased safety awareness;Decreased knowledge of use of DME or AE      OT Treatment/Interventions: Self-care/ADL training;DME and/or AE instruction;Therapeutic activities;Patient/family education;Balance training    OT Goals(Current goals can be found in the care plan section) Acute Rehab OT Goals Patient Stated Goal: to get back to normal  OT Goal Formulation: With patient Time For Goal Achievement: 02/27/18 Potential to Achieve Goals: Good ADL Goals Pt Will Perform Grooming: with modified independence;standing Pt Will Perform Upper Body Bathing: with modified independence;sitting Pt Will Perform Lower Body Bathing: with modified independence;sit to/from stand Pt Will Perform Upper Body Dressing: with modified independence;sitting Pt Will Perform Lower Body Dressing: with modified independence;sit to/from stand Pt Will Transfer to Toilet: with modified independence;ambulating;regular height toilet;bedside commode;grab bars Pt Will Perform Toileting - Clothing Manipulation and hygiene: with modified independence;sit  to/from stand  OT Frequency: Min 2X/week   Barriers to D/C:            Co-evaluation              AM-PAC PT "6 Clicks" Daily Activity     Outcome Measure Help from another person eating meals?: None Help from another person taking care of personal grooming?: A Little Help from another person toileting, which includes using toliet, bedpan, or urinal?: A Little Help from another person bathing (including washing, rinsing, drying)?: A Little Help from another person to put on and taking off regular upper body clothing?: A Little Help from another person to put on and taking off regular lower body clothing?: A Lot 6 Click Score: 18   End of Session Equipment Utilized During Treatment: Rolling walker Nurse Communication: Mobility status  Activity Tolerance: Patient tolerated treatment well Patient left: in chair;with call bell/phone within reach  OT Visit Diagnosis: Unsteadiness on feet (R26.81)                Time: 2952-8413 OT Time Calculation (min): 42 min Charges:  OT General Charges $OT Visit: 1 Visit OT Evaluation $OT Eval Moderate Complexity: 1 Mod OT Treatments $Self Care/Home Management : 8-22 mins $Therapeutic Activity: 8-22 mins G-Codes:     [facility], OTR/L [phone]   Becky Hobbs 02/13/2018, 2:27 PM

## 2018-02-13 NOTE — Progress Notes (Signed)
PROGRESS NOTE    Becky Hobbs  XNT:[id number] DOB: 1945/11/02 DOA: 02/09/2018 PCP: Marton Redwood, MD  Brief Narrative:this Stege is a 72 year old female history of multiple spinal surgeries, diabetes mellitus, CKD3, hypertension, presented to the emergency room due to weakness, found to have borderline hypotension and acute renal failure. -No fevers or chills -improving with IVF  Assessment & Plan:   Active Problems:   Acute renal failure (ARF) (HCC) -suspect this is prerenal in etiology secondary to poor by mouth intake, diuretic use, worsened by ARB -Creatinine at baseline is around 1.5-1.7 From 2016 -Creatinine improving with IV fluids, down from 6.2 to 2.4 now -Renal ultrasound-no hydronephrosis -continue IV fluids, cutdown rate to 50cc/hour -check B met in a.m. -ambulate, PT/OT -home with home health services tomorrow  Possible UTI -treated with IV Levaquin, and unfortunately urine cultures were never sent -stopped levaquin after 5/4 dose, completed 3days of therapy  Diabetes mellitus -Hold trulicity, sliding scale insulin for now  Hypertension -BP improving, diuretics and ARB on hold -hold hydralazine  Chronic incontinence of bowel and bladder -Following numerous spinal surgeries -Due to follow-up with GYN oncology for surgical repair  DVT prophylaxis: Hep SQ Code Status: Full Code Family Communication: None at bedside Disposition Plan: home tomorrow if creatinine continues to trend down further  Consultants:   none   Procedures:   Antimicrobials:  Antibiotics Given (last 72 hours)    Date/Time Action Medication Dose Rate   02/12/18 0002 New Bag/Given   levofloxacin (LEVAQUIN) IVPB 500 mg 500 mg 100 mL/hr      Subjective: -feels better, able to ambulate a little bit more -Breathing okay, no swelling  Objective: Vitals:   02/12/18 0453 02/12/18 1412 02/12/18 2048 02/13/18 0514  BP: (!) 128/47 (!) 126/58 (!) 135/51 (!) 142/51  Pulse: 67 69 70 68    Resp: 17 16 16 16   Temp: 97.9 F (36.6 C) 98.6 F (37 C) 98.1 F (36.7 C) 98.6 F (37 C)  TempSrc: Oral Oral Oral Oral  SpO2: 100% 100% 97% 95%  Weight:      Height:        Intake/Output Summary (Last 24 hours) at 02/13/2018 1110 Last data filed at 02/13/2018 0900 Gross per 24 hour  Intake 1834.5 ml  Output 4050 ml  Net -2215.5 ml   Filed Weights   02/09/18 1731  Weight: 92.1 kg (203 lb)    Examination:  Gen: Awake, Alert, Oriented X 3, chronically ill-appearing HEENT: PERRLA, Neck supple, no JVD Lungs: Good air movement bilaterally, CTAB CVS: RRR,No Gallops,Rubs or new Murmurs Abd: soft, Non tender, non distended, BS present Extremities: No Cyanosis, Clubbing or edema Skin: no new rashes psychiatry: Judgement and insight appear normal. Mood & affect appropriate.     Data Reviewed:   CBC: Recent Labs  Lab 02/09/18 1738 02/12/18 0430 02/13/18 0354  WBC 12.1* 7.3 8.4  NEUTROABS 8.0*  --   --   HGB 9.9* 8.5* 8.7*  HCT 30.0* 26.6* 27.2*  MCV 85.5 86.1 85.5  PLT 258 229 967   Basic Metabolic Panel: Recent Labs  Lab 02/09/18 1738 02/10/18 0424 02/11/18 0803 02/12/18 0430 02/13/18 0354  NA 129* 134* 137 137 139  K 3.7 3.7 4.2 4.6 4.9  CL 92* 100* 106 107 109  CO2 17* 19* 19* 20* 21*  GLUCOSE 102* 98 98 102* 110*  BUN 105* 100* 76* 60* 46*  CREATININE 6.19* 5.04* 3.53* 2.81* 2.42*  CALCIUM 9.5 9.0 9.2 9.2 9.5   GFR: Estimated  Creatinine Clearance: 24.1 mL/min (A) (by C-G formula based on SCr of 2.42 mg/dL (H)). Liver Function Tests: Recent Labs  Lab 02/09/18 1738  AST 96*  ALT 67*  ALKPHOS 131*  BILITOT 0.4  PROT 8.2*  ALBUMIN 3.7   No results for input(s): LIPASE, AMYLASE in the last 168 hours. No results for input(s): AMMONIA in the last 168 hours. Coagulation Profile: No results for input(s): INR, PROTIME in the last 168 hours. Cardiac Enzymes: No results for input(s): CKTOTAL, CKMB, CKMBINDEX, TROPONINI in the last 168 hours. BNP (last  3 results) No results for input(s): PROBNP in the last 8760 hours. HbA1C: No results for input(s): HGBA1C in the last 72 hours. CBG: Recent Labs  Lab 02/12/18 0734 02/12/18 1213 02/12/18 1710 02/12/18 2202 02/13/18 0806  GLUCAP 90 126* 115* 116* 90   Lipid Profile: No results for input(s): CHOL, HDL, LDLCALC, TRIG, CHOLHDL, LDLDIRECT in the last 72 hours. Thyroid Function Tests: No results for input(s): TSH, T4TOTAL, FREET4, T3FREE, THYROIDAB in the last 72 hours. Anemia Panel: No results for input(s): VITAMINB12, FOLATE, FERRITIN, TIBC, IRON, RETICCTPCT in the last 72 hours. Urine analysis:    Component Value Date/Time   COLORURINE YELLOW 02/09/2018 2203   APPEARANCEUR HAZY (A) 02/09/2018 2203   LABSPEC 1.006 02/09/2018 2203   PHURINE 5.0 02/09/2018 2203   GLUCOSEU NEGATIVE 02/09/2018 2203   HGBUR SMALL (A) 02/09/2018 2203   BILIRUBINUR NEGATIVE 02/09/2018 2203   KETONESUR NEGATIVE 02/09/2018 2203   PROTEINUR NEGATIVE 02/09/2018 2203   UROBILINOGEN 1.0 03/02/2012 1332   NITRITE NEGATIVE 02/09/2018 2203   LEUKOCYTESUR LARGE (A) 02/09/2018 2203   Sepsis Labs: @LABRCNTIP (procalcitonin:4,lacticidven:4)  )No results found for this or any previous visit (from the past 240 hour(s)).       Radiology Studies: No results found.      Scheduled Meds: . allopurinol  100 mg Oral Daily  . aspirin EC  81 mg Oral Daily  . cholecalciferol  5,000 Units Oral Daily  . fluticasone  1 spray Each Nare Daily  . gabapentin  300 mg Oral QHS  . heparin  5,000 Units Subcutaneous Q8H  . insulin aspart  0-5 Units Subcutaneous QHS  . insulin aspart  0-9 Units Subcutaneous TID WC  . levothyroxine  88 mcg Oral QAC breakfast  . metoprolol succinate  100 mg Oral Daily  . multivitamin with minerals  1 tablet Oral Daily  . pantoprazole  40 mg Oral Daily  . rosuvastatin  10 mg Oral Daily  . senna  1 tablet Oral BID   Continuous Infusions: . sodium chloride 50 mL/hr at 02/13/18 1106      LOS: 4 days    Time spent: 51mn    PDomenic Polite MD [facility] Page via [email or web address], password [email or web address] After 7PM please contact night-coverage  02/13/2018, 11:10 AM

## 2018-02-13 NOTE — Plan of Care (Signed)
  Problem: Education: Goal: Knowledge of General Education information will improve Outcome: Progressing Note:  POC reviewed wth pt.

## 2018-02-14 LAB — BASIC METABOLIC PANEL
Anion gap: 11 (ref 5–15)
BUN: 35 mg/dL — ABNORMAL HIGH (ref 6–20)
CO2: 20 mmol/L — ABNORMAL LOW (ref 22–32)
Calcium: 9.5 mg/dL (ref 8.9–10.3)
Chloride: 108 mmol/L (ref 101–111)
Creatinine, Ser: 2.06 mg/dL — ABNORMAL HIGH (ref 0.44–1.00)
GFR calc Af Amer: 27 mL/min — ABNORMAL LOW (ref >60)
GFR calc non Af Amer: 23 mL/min — ABNORMAL LOW (ref >60)
Glucose, Bld: 100 mg/dL — ABNORMAL HIGH (ref 65–99)
Potassium: 4.7 mmol/L (ref 3.5–5.1)
Sodium: 139 mmol/L (ref 135–145)

## 2018-02-14 LAB — GLUCOSE, CAPILLARY: Glucose-Capillary: 86 mg/dL (ref 65–99)

## 2018-02-14 MED ORDER — FUROSEMIDE 20 MG PO TABS: 20.0000 mg | ORAL_TABLET | Freq: Every day | ORAL | Status: DC

## 2018-02-14 NOTE — Plan of Care (Signed)
  Problem: Education: Goal: Knowledge of General Education information will improve 02/14/2018 0603 by Belinda Block, RN Outcome: Progressing 02/14/2018 0602 by Belinda Block, RN Outcome: Progressing   Problem: Clinical Measurements: Goal: Will remain free from infection 02/14/2018 0603 by Belinda Block, RN Outcome: Progressing 02/14/2018 0602 by Belinda Block, RN Outcome: Progressing Goal: Diagnostic test results will improve 02/14/2018 0603 by Belinda Block, RN Outcome: Progressing 02/14/2018 0602 by Belinda Block, RN Outcome: Progressing   Problem: Pain Managment: Goal: General experience of comfort will improve Outcome: Progressing

## 2018-02-14 NOTE — Progress Notes (Signed)
Discharge instructions reviewed with pt.  Pt verbalized understanding and had no questions.  Pt discharged in stable condition via wheelchair with son.  Becky Hobbs

## 2018-02-14 NOTE — Consult Note (Signed)
            [facility] CM Primary Care Navigator  02/14/2018  Becky Hobbs 02-Jan-1946 [id number]  Seen patientat the bedsideto identify possible discharge needs. Patientreports having"weakness, light-headedness and shortness of breath"thathadled to this admission.(acute renal failure)  PatientendorsesDr. Marton Redwood with [facility] as her primary care provider.   Patientshared using [employer] on [employer] obtainmedications withoutdifficulty so far. Patient mentioned that primary care provider's office had given her medication sample (Trulicity) when needed.   Patientstatesthat shemanagesher own medications at home using "pill box" system filled once a month.  Patient reports thatshehas been driving prior to admissionbuther sister Becky Hobbs) and son Becky Hobbs) will be able to providetransportation toherdoctors'appointmentsafter discharge.  Patient livesat home alone. Her sister Becky Hobbs- from [place]) will stay at home with her as she recovers and her son will beable to assist as needed.  Anticipatedplan for dischargeishomewith home health services per therapy recommendation.  Patient voiced understanding to call primary care provider's office whenshereturns home,for a post discharge follow-up appointment within1- 2 weeksor sooner if needs arise.Patient letter (with PCP's contact number) was provided asareminder. Patient mentioned that she will keep her follow-up appointment previously scheduled with primary care provider this Friday.  Explained topatientregardingTHN CM services available for health managementat homebutshedenies anycurrent needs or concerns for now. Patientexpressedunderstandingto seek referral from primary care provider to [facility] care management ifnecessaryand deemed appropriate for anyservices in the future.  [facility] care management information provided for future needs thatshemay  have.  Patienthowever,verbally agreedand optedforEMMIcalls tofollowup herrecoveryat home.  Referral made for [facility] General calls after discharge.   For additional questions please contact:  Edwena Felty A. Jayson Waterhouse, BSN, RN-BC [facility] PRIMARY CARE Navigator Cell: [phone]

## 2018-02-14 NOTE — Care Management Note (Signed)
Case Management Note  Patient Details  Name: LUGENE HITT MRN: [id number] Date of Birth: March 30, 1946  Subjective/Objective:                    Action/Plan:  Patient stated she recently had [facility] and would like [facility] again. Referral given to and accepted by Neoma Laming with [facility]. Expected Discharge Date:  02/14/18               Expected Discharge Plan:  [place]  In-House Referral:     Discharge planning Services  CM Consult  Post Acute Care Choice:  Home Health Choice offered to:  Patient  DME Arranged:  N/A DME Agency:  NA  HH Arranged:  PT [place] Agency:  [facility]  Status of Service:  Completed, signed off  If discussed at [place] of Stay Meetings, dates discussed:    Additional Comments:  Marilu Favre, RN 02/14/2018, 10:37 AM

## 2018-02-14 NOTE — Care Management Important Message (Signed)
Important Message  Patient Details  Name: Becky Hobbs MRN: [id number] Date of Birth: Jun 18, 1946   Medicare Important Message Given:  Yes    Orbie Pyo 02/14/2018, 11:57 AM

## 2018-02-14 NOTE — Progress Notes (Signed)
Physical Therapy Treatment Patient Details Name: Becky Hobbs MRN: [id number] DOB: 1946/04/13 Today's Date: 02/14/2018    History of Present Illness Pt is a 72 y/o female who presents with weakness, dizziness, and decreased PO intake. She was found to be in acute renal failure and possibly with a UTI. PMH significant for multiple spinal surgeries, DM, CKDIII.    PT Comments    Pt awaiting d/c home this am.  Educated and performed stair training for safe entry into home today.  Pt reports she will have assistance from her younger sister and her son at home.  Pt reports her bed room is upstairs but she can stay downstairs in her home if needed.  Plan for HHPT follow up and support from her family remains appropriate at this time.      Follow Up Recommendations  Home health PT;Supervision/Assistance - 24 hour     Equipment Recommendations  None recommended by PT    Recommendations for Other Services       Precautions / Restrictions Precautions Precautions: Fall Restrictions Weight Bearing Restrictions: No    Mobility  Bed Mobility               General bed mobility comments: Pt sitting in chair on arrival.    Transfers Overall transfer level: Needs assistance Equipment used: Rolling walker (2 wheeled) Transfers: Sit to/from [facility] Sit to Stand: Supervision Stand pivot transfers: Supervision       General transfer comment: Close supervision during transfer.  Pt able to recall hand placement.    Ambulation/Gait Ambulation/Gait assistance: Min assist;+2 physical assistance;+2 safety/equipment Ambulation Distance (Feet): 300 Feet Assistive device: Rolling walker (2 wheeled) Gait Pattern/deviations: Step-through pattern;Decreased stride length;Trunk flexed;Leaning posteriorly Gait velocity: Decreased   General Gait Details: Cues for upper trunk control and safety with RW.     Stairs Stairs: Yes Stairs assistance: Min assist Stair  Management: One rail Left Number of Stairs: 3 General stair comments: Cues for hand placement on railing (L), used HHA for R (when descending and opposite when ascending).     Wheelchair Mobility    Modified Rankin (Stroke Patients Only)       Balance     Sitting balance-Leahy Scale: Good       Standing balance-Leahy Scale: Fair(reliance on UE support in standing.  )                              Cognition Arousal/Alertness: Awake/alert Behavior During Therapy: WFL for tasks assessed/performed Overall Cognitive Status: Within Functional Limits for tasks assessed(appears appropriate during session but not formally assessed.  )                                        Exercises      General Comments        Pertinent Vitals/Pain Pain Assessment: No/denies pain    Home Living                      Prior Function            PT Goals (current goals can now be found in the care plan section) Acute Rehab PT Goals Patient Stated Goal: to get back to normal  Potential to Achieve Goals: Good Progress towards PT goals: Progressing toward goals    Frequency  Min 3X/week      PT Plan Current plan remains appropriate    Co-evaluation              AM-PAC PT "6 Clicks" Daily Activity  Outcome Measure  Difficulty turning over in bed (including adjusting bedclothes, sheets and blankets)?: Unable Difficulty moving from lying on back to sitting on the side of the bed? : Unable Difficulty sitting down on and standing up from a chair with arms (e.g., wheelchair, bedside commode, etc,.)?: Unable Help needed moving to and from a bed to chair (including a wheelchair)?: A Little Help needed walking in hospital room?: A Little Help needed climbing 3-5 steps with a railing? : Total 6 Click Score: 10    End of Session Equipment Utilized During Treatment: Gait belt Activity Tolerance: Patient tolerated treatment well Patient left:  in chair;with call bell/phone within reach;with chair alarm set;with nursing/sitter in room Nurse Communication: Mobility status PT Visit Diagnosis: Unsteadiness on feet (R26.81);Difficulty in walking, not elsewhere classified (R26.2)     Time: 8887-5797 PT Time Calculation (min) (ACUTE ONLY): 17 min  Charges:  $Gait Training: 8-22 mins                    G Codes:       Governor Rooks, PTA pager [place] 02/14/2018, 10:36 AM

## 2018-02-16 DIAGNOSIS — J45909 Unspecified asthma, uncomplicated: Secondary | ICD-10-CM | POA: Diagnosis not present

## 2018-02-16 DIAGNOSIS — I129 Hypertensive chronic kidney disease with stage 1 through stage 4 chronic kidney disease, or unspecified chronic kidney disease: Secondary | ICD-10-CM | POA: Diagnosis not present

## 2018-02-16 DIAGNOSIS — E1122 Type 2 diabetes mellitus with diabetic chronic kidney disease: Secondary | ICD-10-CM | POA: Diagnosis not present

## 2018-02-16 DIAGNOSIS — M1990 Unspecified osteoarthritis, unspecified site: Secondary | ICD-10-CM | POA: Diagnosis not present

## 2018-02-16 DIAGNOSIS — Z7951 Long term (current) use of inhaled steroids: Secondary | ICD-10-CM | POA: Diagnosis not present

## 2018-02-16 DIAGNOSIS — Z7982 Long term (current) use of aspirin: Secondary | ICD-10-CM | POA: Diagnosis not present

## 2018-02-16 DIAGNOSIS — R338 Other retention of urine: Secondary | ICD-10-CM | POA: Diagnosis not present

## 2018-02-16 DIAGNOSIS — J45998 Other asthma: Secondary | ICD-10-CM | POA: Diagnosis not present

## 2018-02-16 DIAGNOSIS — Z87440 Personal history of urinary (tract) infections: Secondary | ICD-10-CM | POA: Diagnosis not present

## 2018-02-16 DIAGNOSIS — E038 Other specified hypothyroidism: Secondary | ICD-10-CM | POA: Diagnosis not present

## 2018-02-16 DIAGNOSIS — E039 Hypothyroidism, unspecified: Secondary | ICD-10-CM | POA: Diagnosis not present

## 2018-02-16 DIAGNOSIS — N183 Chronic kidney disease, stage 3 (moderate): Secondary | ICD-10-CM | POA: Diagnosis not present

## 2018-02-16 DIAGNOSIS — F418 Other specified anxiety disorders: Secondary | ICD-10-CM | POA: Diagnosis not present

## 2018-02-16 DIAGNOSIS — F419 Anxiety disorder, unspecified: Secondary | ICD-10-CM | POA: Diagnosis not present

## 2018-02-16 DIAGNOSIS — R339 Retention of urine, unspecified: Secondary | ICD-10-CM | POA: Diagnosis not present

## 2018-02-17 DIAGNOSIS — I129 Hypertensive chronic kidney disease with stage 1 through stage 4 chronic kidney disease, or unspecified chronic kidney disease: Secondary | ICD-10-CM | POA: Diagnosis not present

## 2018-02-17 DIAGNOSIS — E1122 Type 2 diabetes mellitus with diabetic chronic kidney disease: Secondary | ICD-10-CM | POA: Diagnosis not present

## 2018-02-17 DIAGNOSIS — J45909 Unspecified asthma, uncomplicated: Secondary | ICD-10-CM | POA: Diagnosis not present

## 2018-02-17 DIAGNOSIS — M1990 Unspecified osteoarthritis, unspecified site: Secondary | ICD-10-CM | POA: Diagnosis not present

## 2018-02-17 DIAGNOSIS — N183 Chronic kidney disease, stage 3 (moderate): Secondary | ICD-10-CM | POA: Diagnosis not present

## 2018-02-17 DIAGNOSIS — Z87440 Personal history of urinary (tract) infections: Secondary | ICD-10-CM | POA: Diagnosis not present

## 2018-02-21 DIAGNOSIS — M1990 Unspecified osteoarthritis, unspecified site: Secondary | ICD-10-CM | POA: Diagnosis not present

## 2018-02-21 DIAGNOSIS — I129 Hypertensive chronic kidney disease with stage 1 through stage 4 chronic kidney disease, or unspecified chronic kidney disease: Secondary | ICD-10-CM | POA: Diagnosis not present

## 2018-02-21 DIAGNOSIS — Z87440 Personal history of urinary (tract) infections: Secondary | ICD-10-CM | POA: Diagnosis not present

## 2018-02-21 DIAGNOSIS — J45909 Unspecified asthma, uncomplicated: Secondary | ICD-10-CM | POA: Diagnosis not present

## 2018-02-21 DIAGNOSIS — E1122 Type 2 diabetes mellitus with diabetic chronic kidney disease: Secondary | ICD-10-CM | POA: Diagnosis not present

## 2018-02-21 DIAGNOSIS — N183 Chronic kidney disease, stage 3 (moderate): Secondary | ICD-10-CM | POA: Diagnosis not present

## 2018-02-22 DIAGNOSIS — N3946 Mixed incontinence: Secondary | ICD-10-CM | POA: Diagnosis not present

## 2018-02-23 DIAGNOSIS — M1990 Unspecified osteoarthritis, unspecified site: Secondary | ICD-10-CM | POA: Diagnosis not present

## 2018-02-23 DIAGNOSIS — E1122 Type 2 diabetes mellitus with diabetic chronic kidney disease: Secondary | ICD-10-CM | POA: Diagnosis not present

## 2018-02-23 DIAGNOSIS — Z87440 Personal history of urinary (tract) infections: Secondary | ICD-10-CM | POA: Diagnosis not present

## 2018-02-23 DIAGNOSIS — J45909 Unspecified asthma, uncomplicated: Secondary | ICD-10-CM | POA: Diagnosis not present

## 2018-02-23 DIAGNOSIS — N183 Chronic kidney disease, stage 3 (moderate): Secondary | ICD-10-CM | POA: Diagnosis not present

## 2018-02-23 DIAGNOSIS — I129 Hypertensive chronic kidney disease with stage 1 through stage 4 chronic kidney disease, or unspecified chronic kidney disease: Secondary | ICD-10-CM | POA: Diagnosis not present

## 2018-02-24 DIAGNOSIS — R32 Unspecified urinary incontinence: Secondary | ICD-10-CM | POA: Diagnosis not present

## 2018-02-24 DIAGNOSIS — E1129 Type 2 diabetes mellitus with other diabetic kidney complication: Secondary | ICD-10-CM | POA: Diagnosis not present

## 2018-02-24 DIAGNOSIS — N179 Acute kidney failure, unspecified: Secondary | ICD-10-CM | POA: Diagnosis not present

## 2018-02-24 DIAGNOSIS — K7469 Other cirrhosis of liver: Secondary | ICD-10-CM | POA: Diagnosis not present

## 2018-02-24 DIAGNOSIS — I10 Essential (primary) hypertension: Secondary | ICD-10-CM | POA: Diagnosis not present

## 2018-02-24 DIAGNOSIS — Z6837 Body mass index (BMI) 37.0-37.9, adult: Secondary | ICD-10-CM | POA: Diagnosis not present

## 2018-02-24 DIAGNOSIS — N184 Chronic kidney disease, stage 4 (severe): Secondary | ICD-10-CM | POA: Diagnosis not present

## 2018-02-24 NOTE — Discharge Summary (Signed)
Physician Discharge Summary  [name] RJJ:[id number] DOB: 1945/12/04 DOA: 02/09/2018  PCP: Marton Redwood, MD  Admit date: 02/09/2018 Discharge date: 02/14/2018  Time spent: 35 minutes  Recommendations for Outpatient Follow-up:  1. PCP in 1 week, please check BMet at FU 2. Home health PT   Discharge Diagnoses:  Active Problems:  Acute renal failure (ARF) (HCC)  Diabetes   HTN  Chronic neck/back pain  multiple spine surgeries  Discharge Condition: stable  Diet recommendation: diabetic  Filed Weights   02/09/18 1731  Weight: 92.1 kg (203 lb)    History of present illness:  Becky Hobbs is a 72 year old female history of multiple spinal surgeries, diabetes mellitus, CKD3, hypertension, presented to the emergency room due to weakness, found to have borderline hypotension and acute renal failure.  Hospital Course:   Acute renal failure (ARF) ([place]) -suspect this is prerenal in etiology secondary to poor by mouth intake, diuretic use, worsened by ARB -Creatinine at baseline is around 1.5-1.7 From 2016 -Creatinine improving with IV fluids, down from 6.2 to 2.0 -Renal ultrasound-no hydronephrosis -improved with hydration -discharged home in a stable condition, home with home health services today  Possible UTI -treated with IV Levaquin, and unfortunately urine cultures were never sent -stopped levaquin after 5/4 dose, completed 3days of therapy  Diabetes mellitus -Held trulicity, used sliding scale insulin   Hypertension -diuretics and ARB held -ARB Dc'ed and diuretics resumed at discharge  Chronic incontinence of bowel and bladder -Following numerous spinal surgeries -Due to follow-up with GYN oncology for surgical repair   Discharge Exam: Vitals:   02/13/18 2140 02/14/18 0523  BP: (!) 145/56 (!) 144/57  Pulse: 70 61  Resp: 18 18  Temp: 98.6 F (37 C) 98.2 F (36.8 C)  SpO2: 100% 100%    General: AAOx3 Cardiovascular: S1S2/RRR Respiratory:  CTAB  Discharge Instructions   Discharge Instructions    Diet - low sodium heart healthy   Complete by:  As directed    Diet Carb Modified   Complete by:  As directed    Increase activity slowly   Complete by:  As directed      Allergies as of 02/14/2018      Reactions   Other Anaphylaxis   bees   Erythromycin Hives, Itching   Hydrocodone Itching   Morphine And Related Hives, Itching   Hives (moderate severity)   Penicillins Hives   Has patient had a PCN reaction causing immediate rash, facial/tongue/throat swelling, SOB or lightheadedness with hypotension: No Has patient had a PCN reaction causing severe rash involving mucus membranes or skin necrosis: No Has patient had a PCN reaction that required hospitalization: No Has patient had a PCN reaction occurring within the last 10 years: No If all of the above answers are "NO", then may proceed with Cephalosporin use.   Tramadol Hcl Itching      Medication List    STOP taking these medications   losartan 100 MG tablet Commonly known as:  COZAAR   nortriptyline 25 MG capsule Commonly known as:  PAMELOR   torsemide 20 MG tablet Commonly known as:  DEMADEX     TAKE these medications   albuterol 108 (90 Base) MCG/ACT inhaler Commonly known as:  PROVENTIL HFA;VENTOLIN HFA Inhale 2 puffs into the lungs every 6 (six) hours as needed for wheezing or shortness of breath.   allopurinol 100 MG tablet Commonly known as:  ZYLOPRIM Take 100 mg by mouth daily.   ALPRAZolam 0.5 MG tablet Commonly known as:  XANAX Take 0.5 mg by mouth daily as needed for anxiety.   aspirin EC 81 MG tablet Take 81 mg by mouth daily.   bismuth subsalicylate 623 JS/28BT suspension Commonly known as:  PEPTO BISMOL Take 30 mLs by mouth every 6 (six) hours as needed for indigestion or diarrhea or loose stools.   cetirizine 10 MG tablet Commonly known as:  ZYRTEC Take 10 mg by mouth daily.   Cholecalciferol 5000 units Tabs Take 5,000 Units by  mouth daily.   EPINEPHrine 0.3 mg/0.3 mL Soaj injection Commonly known as:  EPIPEN 2-PAK Inject 0.3 mLs (0.3 mg total) into the skin once. What changed:    when to take this  reasons to take this   fluticasone 50 MCG/ACT nasal spray Commonly known as:  FLONASE INT 2 SPRAYS INTO EACH NOSTRIL as needed   furosemide 20 MG tablet Commonly known as:  LASIX Take 1 tablet (20 mg total) by mouth daily. Start in 2days on 5/8 What changed:  additional instructions   gabapentin 300 MG capsule Commonly known as:  NEURONTIN TAKE 1 CAPSULE BY MOUTH EVERY MORNING AND TAKE 2 CAPSULES EVERY NIGHT AT BEDTIME   hydrALAZINE 25 MG tablet Commonly known as:  APRESOLINE Take 25 mg by mouth as needed.   levothyroxine 88 MCG tablet Commonly known as:  SYNTHROID, LEVOTHROID Take 88 mcg by mouth daily before breakfast.   metoprolol succinate 100 MG 24 hr tablet Commonly known as:  TOPROL-XL Take 100 mg by mouth daily. Take with or immediately following a meal.   multivitamin with minerals Tabs tablet Take 1 tablet by mouth daily.   ondansetron 4 MG tablet Commonly known as:  ZOFRAN Take 1 tablet (4 mg total) by mouth every 8 (eight) hours as needed for nausea.   oxyCODONE-acetaminophen 5-325 MG tablet Commonly known as:  PERCOCET/ROXICET Take 1 tablet by mouth every 6 (six) hours as needed for moderate pain.   pantoprazole 40 MG tablet Commonly known as:  PROTONIX Take 40 mg by mouth daily.   potassium chloride SA 20 MEQ tablet Commonly known as:  K-DUR,KLOR-CON Take 20 mEq by mouth daily as needed (fluid). Takes with torsemide.   rOPINIRole 0.5 MG tablet Commonly known as:  REQUIP Take 0.5 mg by mouth as needed.   rosuvastatin 10 MG tablet Commonly known as:  CRESTOR Take 10 mg by mouth daily.   SUMAtriptan 100 MG tablet Commonly known as:  IMITREX TAKE 1 TABLET(100 MG) BY MOUTH 1 TIME AS NEEDED FOR MIGRAINE. MAY REPEAT IN 2 HOURS IF HEADACHE PERSISTS OR RECURS   tiZANidine 2  MG tablet Commonly known as:  ZANAFLEX TAKE 1 TO 2 TABLETS BY MOUTH EVERY 6 HOURS AS NEEDED FOR SPASM   tiZANidine 2 MG tablet Commonly known as:  ZANAFLEX TAKE 1 TO 2 TABLETS BY MOUTH EVERY 6 HOURS AS NEEDED FOR SPASM   TRULICITY 1.5 DV/7.6HY Sopn Generic drug:  Dulaglutide Inject 1.5 mg as directed once a week.   zolpidem 10 MG tablet Commonly known as:  AMBIEN Take 10 mg by mouth at bedtime as needed for sleep.      Allergies  Allergen Reactions  . Other Anaphylaxis    bees  . Erythromycin Hives and Itching  . Hydrocodone Itching  . Morphine And Related Hives and Itching    Hives (moderate severity)  . Penicillins Hives    Has patient had a PCN reaction causing immediate rash, facial/tongue/throat swelling, SOB or lightheadedness with hypotension: No Has patient had a PCN  reaction causing severe rash involving mucus membranes or skin necrosis: No Has patient had a PCN reaction that required hospitalization: No Has patient had a PCN reaction occurring within the last 10 years: No If all of the above answers are "NO", then may proceed with Cephalosporin use.  . Tramadol Hcl Itching   Follow-up Information    Marton Redwood, MD. Schedule an appointment as soon as possible for a visit in 1 week(s).   Specialty:  Internal Medicine Contact information: [place] [address] [phone]            The results of significant diagnostics from this hospitalization (including imaging, microbiology, ancillary and laboratory) are listed below for reference.    Significant Diagnostic Studies: US Renal  Result Date: 02/10/2018 CLINICAL DATA:  Acute kidney injury. EXAM: RENAL / URINARY TRACT ULTRASOUND COMPLETE COMPARISON:  08/01/2017 CT FINDINGS: Right Kidney: Length: 9.7 cm. Mild renal cortical thinning. Normal renal echogenicity. No hydronephrosis. Fetal lobulation. Left Kidney: Length: 10.5 cm. Mild renal cortical thinning. Normal renal echogenicity. Lower  pole 1.8 cm left renal cyst. Bladder: Foley catheter identified within. IMPRESSION: 1.  No hydronephrosis. 2. Renal cortical thinning, suggesting medical renal disease. Electronically Signed   By: Abigail Miyamoto M.D.   On: 02/10/2018 13:37   Dg Chest Port 1 View  Result Date: 02/09/2018 CLINICAL DATA:  Dyspnea EXAM: PORTABLE CHEST 1 VIEW COMPARISON:  08/01/2017 FINDINGS: The heart size and mediastinal contours are within normal limits. Both lungs are clear. The included visualized lower thoracic spinal fusion hardware is stable. IMPRESSION: No active disease. Electronically Signed   By: Ashley Royalty M.D.   On: 02/09/2018 22:21    Microbiology: No results found for this or any previous visit (from the past 240 hour(s)).   Labs: Basic Metabolic Panel: No results for input(s): NA, K, CL, CO2, GLUCOSE, BUN, CREATININE, CALCIUM, MG, PHOS in the last 168 hours. Liver Function Tests: No results for input(s): AST, ALT, ALKPHOS, BILITOT, PROT, ALBUMIN in the last 168 hours. No results for input(s): LIPASE, AMYLASE in the last 168 hours. No results for input(s): AMMONIA in the last 168 hours. CBC: No results for input(s): WBC, NEUTROABS, HGB, HCT, MCV, PLT in the last 168 hours. Cardiac Enzymes: No results for input(s): CKTOTAL, CKMB, CKMBINDEX, TROPONINI in the last 168 hours. BNP: BNP (last 3 results) No results for input(s): BNP in the last 8760 hours.  ProBNP (last 3 results) No results for input(s): PROBNP in the last 8760 hours.  CBG: No results for input(s): GLUCAP in the last 168 hours.     Signed:  Domenic Polite MD.  [facility] 02/24/2018, 1:55 PM

## 2018-02-25 DIAGNOSIS — I10 Essential (primary) hypertension: Secondary | ICD-10-CM | POA: Diagnosis not present

## 2018-02-25 DIAGNOSIS — D649 Anemia, unspecified: Secondary | ICD-10-CM | POA: Diagnosis not present

## 2018-02-25 DIAGNOSIS — N184 Chronic kidney disease, stage 4 (severe): Secondary | ICD-10-CM | POA: Diagnosis not present

## 2018-02-28 DIAGNOSIS — E1122 Type 2 diabetes mellitus with diabetic chronic kidney disease: Secondary | ICD-10-CM | POA: Diagnosis not present

## 2018-02-28 DIAGNOSIS — I129 Hypertensive chronic kidney disease with stage 1 through stage 4 chronic kidney disease, or unspecified chronic kidney disease: Secondary | ICD-10-CM | POA: Diagnosis not present

## 2018-02-28 DIAGNOSIS — J45909 Unspecified asthma, uncomplicated: Secondary | ICD-10-CM | POA: Diagnosis not present

## 2018-02-28 DIAGNOSIS — Z87440 Personal history of urinary (tract) infections: Secondary | ICD-10-CM | POA: Diagnosis not present

## 2018-02-28 DIAGNOSIS — N183 Chronic kidney disease, stage 3 (moderate): Secondary | ICD-10-CM | POA: Diagnosis not present

## 2018-02-28 DIAGNOSIS — M1990 Unspecified osteoarthritis, unspecified site: Secondary | ICD-10-CM | POA: Diagnosis not present

## 2018-03-01 ENCOUNTER — Ambulatory Visit
Admission: RE | Admit: 2018-03-01 | Discharge: 2018-03-01 | Disposition: A | Discharge status: Home / Self Care | Payer: Medicare Other | Source: Ambulatory Visit | Attending: Nurse Practitioner | Admitting: Nurse Practitioner

## 2018-03-01 DIAGNOSIS — K7469 Other cirrhosis of liver: Secondary | ICD-10-CM

## 2018-03-01 DIAGNOSIS — K7460 Unspecified cirrhosis of liver: Secondary | ICD-10-CM | POA: Diagnosis not present

## 2018-03-02 DIAGNOSIS — R159 Full incontinence of feces: Secondary | ICD-10-CM | POA: Diagnosis not present

## 2018-03-02 DIAGNOSIS — N39490 Overflow incontinence: Secondary | ICD-10-CM | POA: Diagnosis not present

## 2018-03-02 DIAGNOSIS — N393 Stress incontinence (female) (male): Secondary | ICD-10-CM | POA: Diagnosis not present

## 2018-03-02 DIAGNOSIS — N398 Other specified disorders of urinary system: Secondary | ICD-10-CM | POA: Diagnosis not present

## 2018-03-02 DIAGNOSIS — R338 Other retention of urine: Secondary | ICD-10-CM | POA: Diagnosis not present

## 2018-03-03 ENCOUNTER — Ambulatory Visit (INDEPENDENT_AMBULATORY_CARE_PROVIDER_SITE_OTHER): Payer: Medicare Other | Admitting: Surgery

## 2018-03-03 ENCOUNTER — Encounter (INDEPENDENT_AMBULATORY_CARE_PROVIDER_SITE_OTHER): Payer: Self-pay | Admitting: Surgery

## 2018-03-03 VITALS — BP 139/78 | HR 83 | Ht 64.0 in | Wt 198.0 lb

## 2018-03-03 DIAGNOSIS — N183 Chronic kidney disease, stage 3 (moderate): Secondary | ICD-10-CM | POA: Diagnosis not present

## 2018-03-03 DIAGNOSIS — G8929 Other chronic pain: Secondary | ICD-10-CM

## 2018-03-03 DIAGNOSIS — N3946 Mixed incontinence: Secondary | ICD-10-CM | POA: Diagnosis not present

## 2018-03-03 DIAGNOSIS — N189 Chronic kidney disease, unspecified: Secondary | ICD-10-CM | POA: Diagnosis not present

## 2018-03-03 DIAGNOSIS — N398 Other specified disorders of urinary system: Secondary | ICD-10-CM | POA: Diagnosis not present

## 2018-03-03 DIAGNOSIS — R339 Retention of urine, unspecified: Secondary | ICD-10-CM | POA: Diagnosis not present

## 2018-03-03 DIAGNOSIS — M25561 Pain in right knee: Secondary | ICD-10-CM

## 2018-03-03 DIAGNOSIS — R159 Full incontinence of feces: Secondary | ICD-10-CM | POA: Diagnosis not present

## 2018-03-03 MED ORDER — METHYLPREDNISOLONE ACETATE 40 MG/ML IJ SUSP
40.0000 mg | INTRAMUSCULAR | Status: AC | PRN
  Administered 2018-03-03: 40 mg via INTRA_ARTICULAR

## 2018-03-03 MED ORDER — LIDOCAINE HCL 1 % IJ SOLN
3.0000 mL | INTRAMUSCULAR | Status: AC | PRN
  Administered 2018-03-03: 3 mL

## 2018-03-03 MED ORDER — BUPIVACAINE HCL 0.25 % IJ SOLN
6.0000 mL | INTRAMUSCULAR | Status: AC | PRN
  Administered 2018-03-03: 6 mL via INTRA_ARTICULAR

## 2018-03-03 NOTE — Progress Notes (Signed)
Office Visit Note   Patient: Becky Hobbs           Date of Birth: [birth year]           MRN: [id number] Visit Date: 03/03/2018              Requested by: Marton Redwood, MD [address] [place], [place] [address] PCP: Marton Redwood, MD   Assessment & Plan: Visit Diagnoses:  1. Chronic pain of right knee     Plan: Patient sent right knee was prepped with Betadine and intra-articular Marcaine/Depo-Medrol injection was performed.  Patient will follow-up in 3 months for recheck.  We may consider Visco supplementation depending on her response.  Patient will monitor blood sugars.  Follow-Up Instructions: Return in about 3 months (around 06/03/2018).   Orders:  No orders of the defined types were placed in this encounter.  No orders of the defined types were placed in this encounter.     Procedures: Large Joint Inj on 03/03/2018 2:26 PM Indications: pain and joint swelling Details: 25 G 1.5 in needle, anteromedial approach Medications: 3 mL lidocaine 1 %; 6 mL bupivacaine 0.25 %; 40 mg methylPREDNISolone acetate 40 MG/ML Consent was given by the patient. Patient was prepped and draped in the usual sterile fashion.       Clinical Data: No additional findings.   Subjective: Chief Complaint  Patient presents with  . Right Knee - Pain, Follow-up    HPI 72 year old female with history of right knee pain second to DJD returns for injection as discussed last office visit.  She continues have ongoing right knee pain.  Patient was recently seen in the emergency room for acute renal failure and has an appointment with urologist this afternoon. Review of Systems No current cardiac pulmonary issues.  Patient did have an issue with acute renal failure which required an emergency room visit and is scheduled to see urologist today.  Objective: Vital Signs: BP 139/78 (BP Location: Left Arm, Patient Position: Sitting, Cuff Size: Normal)   Pulse 83   Ht 5\' 4"  (1.626 m)   Wt 198 lb  (89.8 kg)   BMI 33.99 kg/m   Physical Exam  Constitutional: She is oriented to person, place, and time. No distress.  HENT:  Head: Normocephalic and atraumatic.  Eyes: Pupils are equal, round, and reactive to light. EOM are normal.  Pulmonary/Chest: No respiratory distress.  Musculoskeletal:  Right knee positive crepitus.  Some swelling without large effusion.  Joint line tender  Neurological: She is alert and oriented to person, place, and time.  Skin: Skin is warm and dry.  Psychiatric: She has a normal mood and affect.    Ortho Exam  Specialty Comments:  No specialty comments available.  Imaging: No results found.   PMFS History: Patient Active Problem List   Diagnosis Date Noted  . Acute renal failure (ARF) ([place]) 02/09/2018  . Chronic migraine without aura without status migrainosus, not intractable 06/16/2017  . History of lumbar fusion 08/03/2016    Class: Chronic  . Migraine 04/09/2016  . Acute-on-chronic kidney injury ([place]) 09/07/2015  . Hypertension 09/07/2015  . Hypothyroidism 09/07/2015  . Gout 09/07/2015  . Low back pain 09/07/2015  . Obesity (BMI 30-39.9) 07/06/2013  . Headache 04/12/2013  . Concussion 04/12/2013  . Lumbar spinal stenosis 03/04/2012    Class: Diagnosis of   Past Medical History:  Diagnosis Date  . Anxiety    panic attacks  . Arthritis   . Asthma   .  Blood transfusion    1973--with twins birth  . Diabetes mellitus    borderline  . H/O hiatal hernia    surgery fixed  . Heart murmur    MVP  . Hypertension   . Hypothyroidism     Family History  Problem Relation Age of Onset  . Kidney disease Mother   . Vascular Disease Mother     Past Surgical History:  Procedure Laterality Date  . ABDOMINAL HYSTERECTOMY    . APPENDECTOMY    . BACK SURGERY    . bladder tack    . BREAST CYST INCISION AND DRAINAGE    . BREAST SURGERY    . BUNIONECTOMY    . CHOLECYSTECTOMY    . FRACTURE SURGERY     left wrist  . LUMBAR FUSION   07/26/2017  . LUMBAR LAMINECTOMY  03/04/2012   Procedure: MICRODISCECTOMY LUMBAR LAMINECTOMY;  Surgeon: Jessy Oto, MD;  Location: [facility];  Service: Orthopedics;  Laterality: N/A;  L3-4 central laminectomy  . NASAL SEPTUM SURGERY    . nissan fundoplication    . OVARIAN CYST SURGERY    . THORACIC FUSION  07/26/2017  . TONSILLECTOMY     Social History   Occupational History  . Occupation: retired    Comment: Retired  Tobacco Use  . Smoking status: Never Smoker  . Smokeless tobacco: Never Used  Substance and Sexual Activity  . Alcohol use: No  . Drug use: No  . Sexual activity: Never

## 2018-03-04 DIAGNOSIS — Z87440 Personal history of urinary (tract) infections: Secondary | ICD-10-CM | POA: Diagnosis not present

## 2018-03-04 DIAGNOSIS — N183 Chronic kidney disease, stage 3 (moderate): Secondary | ICD-10-CM | POA: Diagnosis not present

## 2018-03-04 DIAGNOSIS — J45909 Unspecified asthma, uncomplicated: Secondary | ICD-10-CM | POA: Diagnosis not present

## 2018-03-04 DIAGNOSIS — E1122 Type 2 diabetes mellitus with diabetic chronic kidney disease: Secondary | ICD-10-CM | POA: Diagnosis not present

## 2018-03-04 DIAGNOSIS — M1990 Unspecified osteoarthritis, unspecified site: Secondary | ICD-10-CM | POA: Diagnosis not present

## 2018-03-04 DIAGNOSIS — I129 Hypertensive chronic kidney disease with stage 1 through stage 4 chronic kidney disease, or unspecified chronic kidney disease: Secondary | ICD-10-CM | POA: Diagnosis not present

## 2018-03-08 DIAGNOSIS — I129 Hypertensive chronic kidney disease with stage 1 through stage 4 chronic kidney disease, or unspecified chronic kidney disease: Secondary | ICD-10-CM | POA: Diagnosis not present

## 2018-03-08 DIAGNOSIS — Z87440 Personal history of urinary (tract) infections: Secondary | ICD-10-CM | POA: Diagnosis not present

## 2018-03-08 DIAGNOSIS — J45909 Unspecified asthma, uncomplicated: Secondary | ICD-10-CM | POA: Diagnosis not present

## 2018-03-08 DIAGNOSIS — M1990 Unspecified osteoarthritis, unspecified site: Secondary | ICD-10-CM | POA: Diagnosis not present

## 2018-03-08 DIAGNOSIS — E1122 Type 2 diabetes mellitus with diabetic chronic kidney disease: Secondary | ICD-10-CM | POA: Diagnosis not present

## 2018-03-08 DIAGNOSIS — N183 Chronic kidney disease, stage 3 (moderate): Secondary | ICD-10-CM | POA: Diagnosis not present

## 2018-03-28 DIAGNOSIS — N2581 Secondary hyperparathyroidism of renal origin: Secondary | ICD-10-CM | POA: Diagnosis not present

## 2018-03-28 DIAGNOSIS — E611 Iron deficiency: Secondary | ICD-10-CM | POA: Diagnosis not present

## 2018-03-28 DIAGNOSIS — D631 Anemia in chronic kidney disease: Secondary | ICD-10-CM | POA: Diagnosis not present

## 2018-03-28 DIAGNOSIS — I129 Hypertensive chronic kidney disease with stage 1 through stage 4 chronic kidney disease, or unspecified chronic kidney disease: Secondary | ICD-10-CM | POA: Diagnosis not present

## 2018-03-28 DIAGNOSIS — E1122 Type 2 diabetes mellitus with diabetic chronic kidney disease: Secondary | ICD-10-CM | POA: Diagnosis not present

## 2018-03-28 DIAGNOSIS — N183 Chronic kidney disease, stage 3 (moderate): Secondary | ICD-10-CM | POA: Diagnosis not present

## 2018-03-28 DIAGNOSIS — M5441 Lumbago with sciatica, right side: Secondary | ICD-10-CM | POA: Diagnosis not present

## 2018-03-28 DIAGNOSIS — M5442 Lumbago with sciatica, left side: Secondary | ICD-10-CM | POA: Diagnosis not present

## 2018-03-28 DIAGNOSIS — N289 Disorder of kidney and ureter, unspecified: Secondary | ICD-10-CM | POA: Diagnosis not present

## 2018-03-30 ENCOUNTER — Other Ambulatory Visit (INDEPENDENT_AMBULATORY_CARE_PROVIDER_SITE_OTHER): Payer: Self-pay | Admitting: Specialist

## 2018-03-30 DIAGNOSIS — G8929 Other chronic pain: Secondary | ICD-10-CM

## 2018-03-30 DIAGNOSIS — M5442 Lumbago with sciatica, left side: Principal | ICD-10-CM

## 2018-03-30 DIAGNOSIS — G44329 Chronic post-traumatic headache, not intractable: Secondary | ICD-10-CM

## 2018-03-30 NOTE — Telephone Encounter (Signed)
Gabapentin refill request 

## 2018-03-31 ENCOUNTER — Ambulatory Visit (INDEPENDENT_AMBULATORY_CARE_PROVIDER_SITE_OTHER): Payer: Medicare Other | Admitting: Surgery

## 2018-04-07 ENCOUNTER — Ambulatory Visit (INDEPENDENT_AMBULATORY_CARE_PROVIDER_SITE_OTHER): Payer: Self-pay

## 2018-04-07 ENCOUNTER — Encounter (INDEPENDENT_AMBULATORY_CARE_PROVIDER_SITE_OTHER): Payer: Self-pay | Admitting: Surgery

## 2018-04-07 ENCOUNTER — Ambulatory Visit (INDEPENDENT_AMBULATORY_CARE_PROVIDER_SITE_OTHER): Payer: Medicare Other | Admitting: Surgery

## 2018-04-07 VITALS — BP 133/64 | HR 73 | Ht 64.0 in | Wt 208.0 lb

## 2018-04-07 DIAGNOSIS — M1711 Unilateral primary osteoarthritis, right knee: Secondary | ICD-10-CM

## 2018-04-07 DIAGNOSIS — G8929 Other chronic pain: Secondary | ICD-10-CM | POA: Diagnosis not present

## 2018-04-07 DIAGNOSIS — M25561 Pain in right knee: Secondary | ICD-10-CM | POA: Diagnosis not present

## 2018-04-07 NOTE — Progress Notes (Signed)
Office Visit Note   Patient: Becky Hobbs           Date of Birth: 12-01-1945           MRN: [id number] Visit Date: 04/07/2018              Requested by: Marton Redwood, MD [address] [place], [place] [address] PCP: Marton Redwood, MD   Assessment & Plan: Visit Diagnoses:  1. Unilateral primary osteoarthritis, right knee   2. Chronic pain of right knee     Plan: Reviewed x-ray with patient today.  With her ongoing pain/mechanical symptoms that have failed conservative treatment up to this point I recommend getting an MRI right knee to compare to previous study done in 2016.  She will follow-up with Dr. Santiago Bumpers after completion to discuss results and further treatment options.  Advised patient that it may likely come down to her needing outpatient arthroscopy with debridement.  This was briefly explained today.  Follow-Up Instructions: Return in about 3 weeks (around 04/28/2018) for With Dr. Ninfa Linden to review right knee MRI and to discuss surgery.   Orders:  Orders Placed This Encounter  Procedures  . XR KNEE 3 VIEW RIGHT  . MR Knee Right w/o contrast   No orders of the defined types were placed in this encounter.     Procedures: No procedures performed   Clinical Data: No additional findings.   Subjective: Chief Complaint  Patient presents with  . Right Knee - Pain    HPI Patient returns with complaints of right knee pain mechanical symptoms.  States that an intra-articular Marcaine/Depo-Medrol injection that I performed her last office visit only gave some relief for a couple of days.  Symptoms have returned.  She did have right knee MRI scan in 2016 that showed: IMPRESSION: Grade 1/2 MCL sprain without tear.  Horizontal tear posterior horn lateral meniscus reaches the meniscal undersurface. A small vertical tear is seen centrally in the body of the lateral meniscus. No displaced fragment.  Patient did not have surgery with right knee  arthroscopy.    Review of Systems No current cardiac pulmonary GI GU issues  Objective: Vital Signs: BP 133/64 (BP Location: Left Arm, Patient Position: Sitting, Cuff Size: Normal)   Pulse 73   Ht 5\' 4"  (1.626 m)   Wt 208 lb (94.3 kg)   BMI 35.70 kg/m   Physical Exam  Constitutional: She is oriented to person, place, and time. No distress.  HENT:  Head: Normocephalic and atraumatic.  Eyes: Pupils are equal, round, and reactive to light. EOM are normal.  Pulmonary/Chest: No respiratory distress.  Musculoskeletal:  Gait is antalgic.  Right knee she does have some swelling with small effusion.  Medial greater than lateral joint line tenderness.  Positive McMurray's test.  Cruciate collateral ligaments are stable.  Calf nontender.  Neurovascular intact.  Neurological: She is alert and oriented to person, place, and time.    Ortho Exam  Specialty Comments:  No specialty comments available.  Imaging: No results found.   PMFS History: Patient Active Problem List   Diagnosis Date Noted  . Acute renal failure (ARF) ([facility]) 02/09/2018  . Chronic migraine without aura without status migrainosus, not intractable 06/16/2017  . History of lumbar fusion 08/03/2016    Class: Chronic  . Migraine 04/09/2016  . Acute-on-chronic kidney injury ([facility]) 09/07/2015  . Hypertension 09/07/2015  . Hypothyroidism 09/07/2015  . Gout 09/07/2015  . Low back pain 09/07/2015  . Obesity (BMI 30-39.9)  07/06/2013  . Headache 04/12/2013  . Concussion 04/12/2013  . Lumbar spinal stenosis 03/04/2012    Class: Diagnosis of   Past Medical History:  Diagnosis Date  . Anxiety    panic attacks  . Arthritis   . Asthma   . Blood transfusion    1973--with twins birth  . Diabetes mellitus    borderline  . H/O hiatal hernia    surgery fixed  . Heart murmur    MVP  . Hypertension   . Hypothyroidism     Family History  Problem Relation Age of Onset  . Kidney disease Mother   . Vascular Disease  Mother     Past Surgical History:  Procedure Laterality Date  . ABDOMINAL HYSTERECTOMY    . APPENDECTOMY    . BACK SURGERY    . bladder tack    . BREAST CYST INCISION AND DRAINAGE    . BREAST SURGERY    . BUNIONECTOMY    . CHOLECYSTECTOMY    . FRACTURE SURGERY     left wrist  . LUMBAR FUSION  07/26/2017  . LUMBAR LAMINECTOMY  03/04/2012   Procedure: MICRODISCECTOMY LUMBAR LAMINECTOMY;  Surgeon: Jessy Oto, MD;  Location: [facility];  Service: Orthopedics;  Laterality: N/A;  L3-4 central laminectomy  . NASAL SEPTUM SURGERY    . nissan fundoplication    . OVARIAN CYST SURGERY    . THORACIC FUSION  07/26/2017  . TONSILLECTOMY     Social History   Occupational History  . Occupation: retired    Comment: Retired  Tobacco Use  . Smoking status: Never Smoker  . Smokeless tobacco: Never Used  Substance and Sexual Activity  . Alcohol use: No  . Drug use: No  . Sexual activity: Never

## 2018-04-21 ENCOUNTER — Ambulatory Visit
Admission: RE | Admit: 2018-04-21 | Discharge: 2018-04-21 | Disposition: A | Discharge status: Home / Self Care | Payer: Medicare Other | Source: Ambulatory Visit | Attending: Surgery | Admitting: Surgery

## 2018-04-21 ENCOUNTER — Other Ambulatory Visit: Payer: Medicare Other

## 2018-04-21 DIAGNOSIS — I10 Essential (primary) hypertension: Secondary | ICD-10-CM | POA: Diagnosis not present

## 2018-04-21 DIAGNOSIS — M25561 Pain in right knee: Secondary | ICD-10-CM

## 2018-04-21 DIAGNOSIS — G8929 Other chronic pain: Secondary | ICD-10-CM

## 2018-04-21 DIAGNOSIS — E7849 Other hyperlipidemia: Secondary | ICD-10-CM | POA: Diagnosis not present

## 2018-04-21 DIAGNOSIS — G43909 Migraine, unspecified, not intractable, without status migrainosus: Secondary | ICD-10-CM | POA: Diagnosis not present

## 2018-04-21 DIAGNOSIS — M25461 Effusion, right knee: Secondary | ICD-10-CM | POA: Diagnosis not present

## 2018-04-21 DIAGNOSIS — N184 Chronic kidney disease, stage 4 (severe): Secondary | ICD-10-CM | POA: Diagnosis not present

## 2018-04-21 DIAGNOSIS — E1129 Type 2 diabetes mellitus with other diabetic kidney complication: Secondary | ICD-10-CM | POA: Diagnosis not present

## 2018-04-21 DIAGNOSIS — Z6837 Body mass index (BMI) 37.0-37.9, adult: Secondary | ICD-10-CM | POA: Diagnosis not present

## 2018-04-22 DIAGNOSIS — N3941 Urge incontinence: Secondary | ICD-10-CM | POA: Diagnosis not present

## 2018-04-22 DIAGNOSIS — R159 Full incontinence of feces: Secondary | ICD-10-CM | POA: Diagnosis not present

## 2018-04-22 DIAGNOSIS — Z4549 Encounter for adjustment and management of other implanted nervous system device: Secondary | ICD-10-CM | POA: Diagnosis not present

## 2018-04-22 DIAGNOSIS — N398 Other specified disorders of urinary system: Secondary | ICD-10-CM | POA: Diagnosis not present

## 2018-04-27 ENCOUNTER — Encounter (INDEPENDENT_AMBULATORY_CARE_PROVIDER_SITE_OTHER): Payer: Self-pay | Admitting: Orthopaedic Surgery

## 2018-04-27 ENCOUNTER — Ambulatory Visit (INDEPENDENT_AMBULATORY_CARE_PROVIDER_SITE_OTHER): Payer: Medicare Other | Admitting: Orthopaedic Surgery

## 2018-04-27 DIAGNOSIS — G8929 Other chronic pain: Secondary | ICD-10-CM

## 2018-04-27 DIAGNOSIS — M25561 Pain in right knee: Secondary | ICD-10-CM

## 2018-04-27 MED ORDER — LIDOCAINE HCL 1 % IJ SOLN
3.0000 mL | INTRAMUSCULAR | Status: AC | PRN
  Administered 2018-04-27: 3 mL

## 2018-04-27 MED ORDER — METHYLPREDNISOLONE ACETATE 40 MG/ML IJ SUSP
40.0000 mg | INTRAMUSCULAR | Status: AC | PRN
  Administered 2018-04-27: 40 mg via INTRA_ARTICULAR

## 2018-04-27 NOTE — Progress Notes (Signed)
   Procedure Note  Patient: Becky Hobbs             Date of Birth: Mar 29, 1946           MRN: [id number]             Visit Date: 04/27/2018  Procedures: Visit Diagnoses: Chronic pain of right knee  Large Joint Inj: R knee on 04/27/2018 2:51 PM Indications: diagnostic evaluation and pain Details: 22 G 1.5 in needle, superolateral approach  Arthrogram: No  Medications: 3 mL lidocaine 1 %; 40 mg methylPREDNISolone acetate 40 MG/ML Outcome: tolerated well, no immediate complications Procedure, treatment alternatives, risks and benefits explained, specific risks discussed. Consent was given by the patient. Immediately prior to procedure a time out was called to verify the correct patient, procedure, equipment, support staff and site/side marked as required. Patient was prepped and draped in the usual sterile fashion.    The patient is here today to go over an MRI of her right knee that was ordered by Mack Guise.  She is been having some problems with that knee and had failed conservative treatment.  On exam her knee does have slight valgus malalignment.  She says she has minimal pain today though.  She is had a spinal cord stimulator placed just a week ago at [facility].  This is a trial stimulator.  She is walking without assistive device.  There is no effusion of her knee today with good range of motion.  MRI is reviewed with her and does show some thinning of the articular cartilage on the medial aspect of her knee in the patellofemoral joint.  There is no significant meniscal tearing.  There is a small signal changes in the posterior lateral corner of the knee but no large gross tear at all.  I told her that I would not do anything with her knee at all based on the MRI findings other than considering injection again.  She is agreeable to trying this.  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  She tolerated steroid injection well.  All questions concerns were answered and  addressed.  Follow-up be as needed.

## 2018-04-28 ENCOUNTER — Ambulatory Visit (INDEPENDENT_AMBULATORY_CARE_PROVIDER_SITE_OTHER): Payer: Medicare Other | Admitting: Orthopaedic Surgery

## 2018-04-28 ENCOUNTER — Other Ambulatory Visit (INDEPENDENT_AMBULATORY_CARE_PROVIDER_SITE_OTHER): Payer: Self-pay | Admitting: Specialist

## 2018-04-28 DIAGNOSIS — G8929 Other chronic pain: Secondary | ICD-10-CM

## 2018-04-28 DIAGNOSIS — G44329 Chronic post-traumatic headache, not intractable: Secondary | ICD-10-CM

## 2018-04-28 DIAGNOSIS — M5442 Lumbago with sciatica, left side: Principal | ICD-10-CM

## 2018-04-28 NOTE — Telephone Encounter (Signed)
gabapentin refill request

## 2018-05-02 ENCOUNTER — Encounter: Payer: Self-pay | Admitting: Neurology

## 2018-05-02 ENCOUNTER — Telehealth: Payer: Self-pay | Admitting: Neurology
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
## 2018-05-03 DIAGNOSIS — N398 Other specified disorders of urinary system: Secondary | ICD-10-CM | POA: Diagnosis not present

## 2018-05-03 DIAGNOSIS — R159 Full incontinence of feces: Secondary | ICD-10-CM | POA: Diagnosis not present

## 2018-05-04 DIAGNOSIS — N398 Other specified disorders of urinary system: Secondary | ICD-10-CM | POA: Diagnosis not present

## 2018-05-04 DIAGNOSIS — N3281 Overactive bladder: Secondary | ICD-10-CM | POA: Diagnosis not present

## 2018-05-06 DIAGNOSIS — J45909 Unspecified asthma, uncomplicated: Secondary | ICD-10-CM | POA: Diagnosis not present

## 2018-05-06 DIAGNOSIS — E039 Hypothyroidism, unspecified: Secondary | ICD-10-CM | POA: Diagnosis not present

## 2018-05-06 DIAGNOSIS — T85191A Other mechanical complication of implanted electronic neurostimulator (electrode) of peripheral nerve, initial encounter: Secondary | ICD-10-CM | POA: Diagnosis not present

## 2018-05-06 DIAGNOSIS — Z4549 Encounter for adjustment and management of other implanted nervous system device: Secondary | ICD-10-CM | POA: Diagnosis not present

## 2018-05-06 DIAGNOSIS — I10 Essential (primary) hypertension: Secondary | ICD-10-CM | POA: Diagnosis not present

## 2018-05-06 DIAGNOSIS — E119 Type 2 diabetes mellitus without complications: Secondary | ICD-10-CM | POA: Diagnosis not present

## 2018-05-06 DIAGNOSIS — T83110A Breakdown (mechanical) of urinary electronic stimulator device, initial encounter: Secondary | ICD-10-CM | POA: Diagnosis not present

## 2018-05-06 DIAGNOSIS — R350 Frequency of micturition: Secondary | ICD-10-CM | POA: Diagnosis not present

## 2018-05-06 DIAGNOSIS — Z7984 Long term (current) use of oral hypoglycemic drugs: Secondary | ICD-10-CM | POA: Diagnosis not present

## 2018-05-06 DIAGNOSIS — R3915 Urgency of urination: Secondary | ICD-10-CM | POA: Diagnosis not present

## 2018-05-06 DIAGNOSIS — R011 Cardiac murmur, unspecified: Secondary | ICD-10-CM | POA: Diagnosis not present

## 2018-05-06 DIAGNOSIS — Z7982 Long term (current) use of aspirin: Secondary | ICD-10-CM | POA: Diagnosis not present

## 2018-05-12 ENCOUNTER — Ambulatory Visit (INDEPENDENT_AMBULATORY_CARE_PROVIDER_SITE_OTHER): Payer: Medicare Other | Admitting: Specialist

## 2018-05-12 DIAGNOSIS — N183 Chronic kidney disease, stage 3 (moderate): Secondary | ICD-10-CM | POA: Diagnosis not present

## 2018-05-12 DIAGNOSIS — N189 Chronic kidney disease, unspecified: Secondary | ICD-10-CM | POA: Diagnosis not present

## 2018-05-15 ENCOUNTER — Other Ambulatory Visit: Payer: Self-pay | Admitting: Neurology

## 2018-05-15 DIAGNOSIS — G43009 Migraine without aura, not intractable, without status migrainosus: Secondary | ICD-10-CM

## 2018-05-15 DIAGNOSIS — R110 Nausea: Secondary | ICD-10-CM

## 2018-05-22 ENCOUNTER — Other Ambulatory Visit (INDEPENDENT_AMBULATORY_CARE_PROVIDER_SITE_OTHER): Payer: Self-pay | Admitting: Specialist

## 2018-05-22 DIAGNOSIS — G8929 Other chronic pain: Secondary | ICD-10-CM

## 2018-05-22 DIAGNOSIS — M5442 Lumbago with sciatica, left side: Principal | ICD-10-CM

## 2018-05-22 DIAGNOSIS — G44329 Chronic post-traumatic headache, not intractable: Secondary | ICD-10-CM

## 2018-05-23 NOTE — Telephone Encounter (Signed)
Gabapentin  refill request, can you please advise?

## 2018-05-24 ENCOUNTER — Other Ambulatory Visit: Payer: Self-pay | Admitting: Neurology

## 2018-05-24 DIAGNOSIS — G43009 Migraine without aura, not intractable, without status migrainosus: Secondary | ICD-10-CM

## 2018-05-24 DIAGNOSIS — R110 Nausea: Secondary | ICD-10-CM

## 2018-05-25 DIAGNOSIS — M5106 Intervertebral disc disorders with myelopathy, lumbar region: Secondary | ICD-10-CM | POA: Diagnosis not present

## 2018-05-25 DIAGNOSIS — M4326 Fusion of spine, lumbar region: Secondary | ICD-10-CM | POA: Diagnosis not present

## 2018-06-02 ENCOUNTER — Ambulatory Visit (INDEPENDENT_AMBULATORY_CARE_PROVIDER_SITE_OTHER): Payer: Medicare Other | Admitting: Specialist

## 2018-06-14 ENCOUNTER — Other Ambulatory Visit (INDEPENDENT_AMBULATORY_CARE_PROVIDER_SITE_OTHER): Payer: Self-pay | Admitting: Orthopedic Surgery

## 2018-06-14 DIAGNOSIS — M5442 Lumbago with sciatica, left side: Principal | ICD-10-CM

## 2018-06-14 DIAGNOSIS — G44329 Chronic post-traumatic headache, not intractable: Secondary | ICD-10-CM

## 2018-06-14 DIAGNOSIS — G8929 Other chronic pain: Secondary | ICD-10-CM

## 2018-06-14 NOTE — Telephone Encounter (Signed)
Gabapentin refill request 

## 2018-06-14 NOTE — Telephone Encounter (Signed)
Is this a JN pt?

## 2018-06-23 DIAGNOSIS — R945 Abnormal results of liver function studies: Secondary | ICD-10-CM | POA: Diagnosis not present

## 2018-06-23 DIAGNOSIS — E611 Iron deficiency: Secondary | ICD-10-CM | POA: Diagnosis not present

## 2018-06-23 DIAGNOSIS — N179 Acute kidney failure, unspecified: Secondary | ICD-10-CM | POA: Diagnosis not present

## 2018-06-23 DIAGNOSIS — E669 Obesity, unspecified: Secondary | ICD-10-CM | POA: Diagnosis not present

## 2018-06-23 DIAGNOSIS — Z23 Encounter for immunization: Secondary | ICD-10-CM | POA: Diagnosis not present

## 2018-06-23 DIAGNOSIS — N183 Chronic kidney disease, stage 3 (moderate): Secondary | ICD-10-CM | POA: Diagnosis not present

## 2018-06-23 DIAGNOSIS — I129 Hypertensive chronic kidney disease with stage 1 through stage 4 chronic kidney disease, or unspecified chronic kidney disease: Secondary | ICD-10-CM | POA: Diagnosis not present

## 2018-06-23 DIAGNOSIS — F0781 Postconcussional syndrome: Secondary | ICD-10-CM | POA: Diagnosis not present

## 2018-06-23 DIAGNOSIS — E1122 Type 2 diabetes mellitus with diabetic chronic kidney disease: Secondary | ICD-10-CM | POA: Diagnosis not present

## 2018-06-23 DIAGNOSIS — B1920 Unspecified viral hepatitis C without hepatic coma: Secondary | ICD-10-CM | POA: Diagnosis not present

## 2018-06-29 DIAGNOSIS — R300 Dysuria: Secondary | ICD-10-CM | POA: Diagnosis not present

## 2018-06-30 DIAGNOSIS — R21 Rash and other nonspecific skin eruption: Secondary | ICD-10-CM | POA: Diagnosis not present

## 2018-06-30 DIAGNOSIS — R0602 Shortness of breath: Secondary | ICD-10-CM | POA: Diagnosis not present

## 2018-07-12 DIAGNOSIS — R300 Dysuria: Secondary | ICD-10-CM | POA: Diagnosis not present

## 2018-07-12 DIAGNOSIS — N390 Urinary tract infection, site not specified: Secondary | ICD-10-CM | POA: Diagnosis not present

## 2018-07-14 ENCOUNTER — Ambulatory Visit (INDEPENDENT_AMBULATORY_CARE_PROVIDER_SITE_OTHER): Payer: Medicare Other

## 2018-07-14 ENCOUNTER — Encounter: Payer: Self-pay | Admitting: Podiatry

## 2018-07-14 ENCOUNTER — Encounter (INDEPENDENT_AMBULATORY_CARE_PROVIDER_SITE_OTHER): Payer: Self-pay | Admitting: Specialist

## 2018-07-14 ENCOUNTER — Ambulatory Visit (INDEPENDENT_AMBULATORY_CARE_PROVIDER_SITE_OTHER): Payer: Medicare Other | Admitting: Specialist

## 2018-07-14 ENCOUNTER — Ambulatory Visit (INDEPENDENT_AMBULATORY_CARE_PROVIDER_SITE_OTHER): Payer: Medicare Other | Admitting: Podiatry

## 2018-07-14 VITALS — BP 112/61 | HR 75 | Ht 64.0 in | Wt 208.0 lb

## 2018-07-14 VITALS — BP 144/68 | HR 76

## 2018-07-14 DIAGNOSIS — M722 Plantar fascial fibromatosis: Secondary | ICD-10-CM

## 2018-07-14 DIAGNOSIS — M1711 Unilateral primary osteoarthritis, right knee: Secondary | ICD-10-CM

## 2018-07-14 DIAGNOSIS — N3942 Incontinence without sensory awareness: Secondary | ICD-10-CM | POA: Diagnosis not present

## 2018-07-14 DIAGNOSIS — M4325 Fusion of spine, thoracolumbar region: Secondary | ICD-10-CM | POA: Diagnosis not present

## 2018-07-14 DIAGNOSIS — N3946 Mixed incontinence: Secondary | ICD-10-CM | POA: Diagnosis not present

## 2018-07-14 MED ORDER — TRIAMCINOLONE ACETONIDE 10 MG/ML IJ SUSP
10.0000 mg | Freq: Once | INTRAMUSCULAR | Status: AC
  Administered 2018-07-14: 10 mg

## 2018-07-14 MED ORDER — METHYLPREDNISOLONE ACETATE 40 MG/ML IJ SUSP
40.0000 mg | INTRAMUSCULAR | Status: AC | PRN
  Administered 2018-07-14: 40 mg via INTRA_ARTICULAR

## 2018-07-14 MED ORDER — BUPIVACAINE HCL 0.5 % IJ SOLN
3.0000 mL | INTRAMUSCULAR | Status: AC | PRN
  Administered 2018-07-14: 3 mL via INTRA_ARTICULAR

## 2018-07-14 NOTE — Patient Instructions (Signed)

## 2018-07-14 NOTE — Patient Instructions (Signed)
Avoid frequent bending and stooping  No lifting greater than 10 lbs. May use ice or moist heat for pain. Weight loss is of benefit.  Exercise is important to improve your indurance and does allow people to function better inspite of back pain.  CBD cap [email or web address] 6,000 mg  daily

## 2018-07-14 NOTE — Progress Notes (Signed)
Office Visit Note   Patient: Becky Hobbs           Date of Birth: 1946-03-09           MRN: [id number] Visit Date: 07/14/2018              Requested by: Marton Redwood, MD [address] [place], [place] [address] PCP: Marton Redwood, MD   Assessment & Plan: Visit Diagnoses:  1. Fusion of spine of thoracolumbar region   2. Plantar fasciitis of right foot   3. Unilateral primary osteoarthritis, right knee     Plan: Avoid frequent bending and stooping  No lifting greater than 10 lbs. May use ice or moist heat for pain. Weight loss is of benefit.  Exercise is important to improve your indurance and does allow people to function better inspite of back pain.  CBD cap [email or web address] 6,000 mg  daily  Follow-Up Instructions: Return in about 8 weeks (around 09/08/2018).   Orders:  Orders Placed This Encounter  Procedures  . Large Joint Inj   No orders of the defined types were placed in this encounter.     Procedures: Large Joint Inj: R knee on 07/14/2018 5:25 PM Indications: pain Details: 25 G 1.5 in needle, anterolateral approach  Arthrogram: No  Medications: 40 mg methylPREDNISolone acetate 40 MG/ML; 3 mL bupivacaine 0.5 % Outcome: tolerated well, no immediate complications  Bandaid applied Procedure, treatment alternatives, risks and benefits explained, specific risks discussed. Consent was given by the patient. Immediately prior to procedure a time out was called to verify the correct patient, procedure, equipment, support staff and site/side marked as required. Patient was prepped and draped in the usual sterile fashion.       Clinical Data: No additional findings.   Subjective: Chief Complaint  Patient presents with  . Right Knee - Follow-up    She rec'd injection from Dr. Ninfa Linden on 04/27/18, and she states that she got relief for 1.5 months.    72 year old female with history of L4to S1 fusion extended to  Thoracic spine by [facility] Scoliosis and  spine Dr. Patrice Paradise. She is not falling and the pain in her back is better.    Review of Systems  Constitutional: Negative.   HENT: Negative.   Eyes: Negative.   Respiratory: Negative.   Cardiovascular: Negative.   Gastrointestinal: Negative.   Endocrine: Negative.   Genitourinary: Negative.   Musculoskeletal: Negative.   Skin: Negative.   Allergic/Immunologic: Negative.   Neurological: Negative.   Hematological: Negative.   Psychiatric/Behavioral: Negative.      Objective: Vital Signs: BP 112/61 (BP Location: Left Arm, Patient Position: Sitting)   Pulse 75   Ht 5\' 4"  (1.626 m)   Wt 208 lb (94.3 kg)   BMI 35.70 kg/m   Physical Exam  Constitutional: She is oriented to person, place, and time. She appears well-developed and well-nourished.  HENT:  Head: Normocephalic and atraumatic.  Eyes: Pupils are equal, round, and reactive to light. EOM are normal.  Neck: Normal range of motion. Neck supple.  Pulmonary/Chest: Effort normal and breath sounds normal.  Abdominal: Soft. Bowel sounds are normal.  Musculoskeletal:       Right knee: She exhibits effusion.  Neurological: She is alert and oriented to person, place, and time.  Skin: Skin is warm and dry.  Psychiatric: She has a normal mood and affect. Her behavior is normal. Judgment and thought content normal.    Right Knee Exam   Muscle Strength  The patient has normal right knee strength.  Tenderness  The patient is experiencing tenderness in the lateral retinaculum.  Range of Motion  Extension: normal  Flexion:  120 abnormal   Tests  McMurray:  Medial - negative Lateral - positive Varus: negative Valgus: negative Lachman:  Anterior - negative    Posterior - negative Drawer:  Anterior - negative    Posterior - negative Pivot shift: negative  Other  Erythema: present Swelling: mild Effusion: effusion present   Left Knee Exam   Muscle Strength  The patient has normal left knee strength.   Back Exam    Tenderness  The patient is experiencing tenderness in the lumbar.  Range of Motion  Extension: normal  Flexion: normal  Lateral bend right: normal  Lateral bend left: normal  Rotation right: normal  Rotation left: normal   Muscle Strength  Right Quadriceps:  5/5  Left Quadriceps:  5/5  Right Hamstrings:  5/5  Left Hamstrings:  5/5   Tests  Straight leg raise right: negative Straight leg raise left: negative  Reflexes  Patellar: normal Achilles: normal Babinski's sign: normal       Specialty Comments:  No specialty comments available.  Imaging: Dg Foot Complete Left  Result Date: 07/14/2018 Please see detailed radiograph report in office note.    PMFS History: Patient Active Problem List   Diagnosis Date Noted  . History of lumbar fusion 08/03/2016    Priority: High    Class: Chronic  . Acute renal failure (ARF) ([facility]) 02/09/2018  . Chronic migraine without aura without status migrainosus, not intractable 06/16/2017  . Migraine 04/09/2016  . Acute-on-chronic kidney injury ([facility]) 09/07/2015  . Hypertension 09/07/2015  . Hypothyroidism 09/07/2015  . Gout 09/07/2015  . Low back pain 09/07/2015  . Obesity (BMI 30-39.9) 07/06/2013  . Headache 04/12/2013  . Concussion 04/12/2013  . Lumbar spinal stenosis 03/04/2012    Class: Diagnosis of   Past Medical History:  Diagnosis Date  . Anxiety    panic attacks  . Arthritis   . Asthma   . Blood transfusion    1973--with twins birth  . Diabetes mellitus    borderline  . H/O hiatal hernia    surgery fixed  . Heart murmur    MVP  . Hypertension   . Hypothyroidism     Family History  Problem Relation Age of Onset  . Kidney disease Mother   . Vascular Disease Mother     Past Surgical History:  Procedure Laterality Date  . ABDOMINAL HYSTERECTOMY    . APPENDECTOMY    . BACK SURGERY    . bladder tack    . BREAST CYST INCISION AND DRAINAGE    . BREAST SURGERY    . BUNIONECTOMY    . CHOLECYSTECTOMY     . FRACTURE SURGERY     left wrist  . LUMBAR FUSION  07/26/2017  . LUMBAR LAMINECTOMY  03/04/2012   Procedure: MICRODISCECTOMY LUMBAR LAMINECTOMY;  Surgeon: Jessy Oto, MD;  Location: [facility];  Service: Orthopedics;  Laterality: N/A;  L3-4 central laminectomy  . NASAL SEPTUM SURGERY    . nissan fundoplication    . OVARIAN CYST SURGERY    . THORACIC FUSION  07/26/2017  . TONSILLECTOMY     Social History   Occupational History  . Occupation: retired    Comment: Retired  Tobacco Use  . Smoking status: Never Smoker  . Smokeless tobacco: Never Used  Substance and Sexual Activity  . Alcohol use: No  .  Drug use: No  . Sexual activity: Never

## 2018-07-17 NOTE — Progress Notes (Signed)
Subjective:   Patient ID: Becky Hobbs, female   DOB: 72 y.o.   MRN: [id number]   HPI 72 year old female presents the office today for concerns of recurrent pain to the palm of left heel which is been over the last 3 to 4 weeks.  She has a history of plantar fasciitis and she says it feels the same as it has previously.  She denies any recent injury or trauma to her foot she denies any swelling or redness.  The pain does not wake her up at night.  She has no other concerns today.   Review of Systems  All other systems reviewed and are negative.  Past Medical History:  Diagnosis Date  . Anxiety    panic attacks  . Arthritis   . Asthma   . Blood transfusion    1973--with twins birth  . Diabetes mellitus    borderline  . H/O hiatal hernia    surgery fixed  . Heart murmur    MVP  . Hypertension   . Hypothyroidism     Past Surgical History:  Procedure Laterality Date  . ABDOMINAL HYSTERECTOMY    . APPENDECTOMY    . BACK SURGERY    . bladder tack    . BREAST CYST INCISION AND DRAINAGE    . BREAST SURGERY    . BUNIONECTOMY    . CHOLECYSTECTOMY    . FRACTURE SURGERY     left wrist  . LUMBAR FUSION  07/26/2017  . LUMBAR LAMINECTOMY  03/04/2012   Procedure: MICRODISCECTOMY LUMBAR LAMINECTOMY;  Surgeon: Jessy Oto, MD;  Location: [facility];  Service: Orthopedics;  Laterality: N/A;  L3-4 central laminectomy  . NASAL SEPTUM SURGERY    . nissan fundoplication    . OVARIAN CYST SURGERY    . THORACIC FUSION  07/26/2017  . TONSILLECTOMY       Current Outpatient Medications:  .  ACCU-CHEK FASTCLIX LANCETS MISC, 2 (two) times daily. for testing, Disp: , Rfl: 11 .  albuterol (PROVENTIL HFA;VENTOLIN HFA) 108 (90 BASE) MCG/ACT inhaler, Inhale 2 puffs into the lungs every 6 (six) hours as needed for wheezing or shortness of breath., Disp: 1 Inhaler, Rfl: 6 .  allopurinol (ZYLOPRIM) 100 MG tablet, Take 100 mg by mouth daily., Disp: , Rfl:  .  ALPRAZolam (XANAX) 0.5 MG tablet, Take 0.5  mg by mouth daily as needed for anxiety. , Disp: , Rfl:  .  ALPRAZolam (XANAX) 0.5 MG tablet, Take by mouth., Disp: , Rfl:  .  aspirin EC 81 MG tablet, Take 81 mg by mouth daily., Disp: , Rfl:  .  bismuth subsalicylate (PEPTO BISMOL) 262 MG/15ML suspension, Take 30 mLs by mouth every 6 (six) hours as needed for indigestion or diarrhea or loose stools., Disp: , Rfl:  .  cetirizine (ZYRTEC) 10 MG tablet, Take 10 mg by mouth daily., Disp: , Rfl:  .  Cholecalciferol 5000 units TABS, Take 5,000 Units by mouth daily. , Disp: , Rfl:  .  EPINEPHrine (EPIPEN 2-PAK) 0.3 mg/0.3 mL IJ SOAJ injection, Inject 0.3 mLs (0.3 mg total) into the skin once. (Patient taking differently: Inject 0.3 mg into the skin daily as needed (allergic reaction). ), Disp: 2 Device, Rfl: 4 .  EPINEPHrine 0.3 mg/0.3 mL IJ SOAJ injection, INJECT INTRAMUSCULARLY AS DIRECTED, Disp: , Rfl:  .  fluticasone (FLONASE) 50 MCG/ACT nasal spray, INT 2 SPRAYS INTO EACH NOSTRIL as needed, Disp: , Rfl: 5 .  furosemide (LASIX) 20 MG tablet, Take 1 tablet (  20 mg total) by mouth daily. Start in 2days on 5/8, Disp: , Rfl:  .  furosemide (LASIX) 20 MG tablet, TAKE ONE TABLET BY MOUTH DAILY, Disp: , Rfl:  .  gabapentin (NEURONTIN) 300 MG capsule, TAKE 1 CAPSULE BY MOUTH EVERY MORNING AND TAKE 2 CAPSULES EVERY NIGHT AT BEDTIME, Disp: , Rfl:  .  gabapentin (NEURONTIN) 300 MG capsule, TAKE 1 CAPSULE BY MOUTH EVERY MORNING AND TAKE 2 CAPSULES EVERY AT BEDTIME, Disp: 90 capsule, Rfl: 0 .  hydrALAZINE (APRESOLINE) 25 MG tablet, Take 25 mg by mouth as needed. , Disp: , Rfl:  .  HYDROmorphone (DILAUDID) 2 MG tablet, TK 1 T PO Q 6 H PRN FOR SEVERE PAIN FOR UP TO 5 DAYS, Disp: , Rfl: 0 .  levothyroxine (SYNTHROID, LEVOTHROID) 88 MCG tablet, Take 88 mcg by mouth daily before breakfast. , Disp: , Rfl:  .  metoprolol succinate (TOPROL-XL) 100 MG 24 hr tablet, Take 100 mg by mouth daily. Take with or immediately following a meal., Disp: , Rfl:  .  Multiple Vitamin  (MULITIVITAMIN WITH MINERALS) TABS, Take 1 tablet by mouth daily., Disp: , Rfl:  .  ondansetron (ZOFRAN) 4 MG tablet, Take 1 tablet (4 mg total) by mouth every 8 (eight) hours as needed for nausea., Disp: 30 tablet, Rfl: 6 .  oxyCODONE-acetaminophen (PERCOCET/ROXICET) 5-325 MG per tablet, Take 1 tablet by mouth every 6 (six) hours as needed for moderate pain. , Disp: , Rfl: 0 .  pantoprazole (PROTONIX) 40 MG tablet, Take 40 mg by mouth daily., Disp: , Rfl:  .  potassium chloride SA (K-DUR,KLOR-CON) 20 MEQ tablet, Take 20 mEq by mouth daily as needed (fluid). Takes with torsemide., Disp: , Rfl:  .  rOPINIRole (REQUIP) 0.5 MG tablet, Take 0.5 mg by mouth as needed. , Disp: , Rfl:  .  rosuvastatin (CRESTOR) 10 MG tablet, Take 10 mg by mouth daily. , Disp: , Rfl:  .  sulfamethoxazole-trimethoprim (BACTRIM DS,SEPTRA DS) 800-160 MG tablet, TK 1 T PO  BID, Disp: , Rfl: 0 .  SUMAtriptan (IMITREX) 100 MG tablet, TAKE 1 TABLET(100 MG) BY MOUTH 1 TIME AS NEEDED FOR MIGRAINE. MAY REPEAT IN 2 HOURS IF HEADACHE PERSISTS OR RECURS, Disp: 12 tablet, Rfl: 11 .  tiZANidine (ZANAFLEX) 2 MG tablet, TAKE 1 TO 2 TABLETS BY MOUTH EVERY 6 HOURS AS NEEDED FOR SPASM, Disp: 771 tablet, Rfl: 3 .  tiZANidine (ZANAFLEX) 2 MG tablet, TAKE 1 TO 2 TABLETS BY MOUTH EVERY 6 HOURS AS NEEDED FOR SPASM, Disp: 60 tablet, Rfl: 0 .  TRULICITY 1.5 QI/3.4VQ SOPN, Inject 1.5 mg as directed once a week., Disp: , Rfl: 11 .  zolpidem (AMBIEN) 10 MG tablet, Take 10 mg by mouth at bedtime as needed for sleep. , Disp: , Rfl:   Allergies  Allergen Reactions  . Bee Venom Anaphylaxis  . Buprenorphine Hcl Hives and Itching    Hives (moderate severity) Hives (moderate severity)   . Erythromycin Hives and Itching  . Other Anaphylaxis    bees  . Penicillins Hives    Has patient had a PCN reaction causing immediate rash, facial/tongue/throat swelling, SOB or lightheadedness with hypotension: No Has patient had a PCN reaction causing severe rash  involving mucus membranes or skin necrosis: No Has patient had a PCN reaction that required hospitalization: No Has patient had a PCN reaction occurring within the last 10 years: No If all of the above answers are "NO", then may proceed with Cephalosporin use.  Marland Kitchen Hydrocodone Itching  .  Morphine And Related Hives and Itching    Hives (moderate severity)  . Tramadol Hcl Itching  . Tramadol Hcl Itching  . Hydrocodone-Acetaminophen Itching    Pt takes medication       Objective:  Physical Exam  General: AAO x3, NAD  Dermatological: Skin is warm, dry and supple bilateral. Nails x 10 are well manicured; remaining integument appears unremarkable at this time. There are no open sores, no preulcerative lesions, no rash or signs of infection present.  Vascular: Dorsalis Pedis artery and Posterior Tibial artery pedal pulses are 2/4 bilateral with immedate capillary fill time.  There is no pain with calf compression, swelling, warmth, erythema.   Neruologic: Grossly intact via light touch bilateral. Protective threshold with Semmes Wienstein monofilament intact to all pedal sites bilateral.  Negative Tinel sign.  Musculoskeletal: Tenderness to palpation along the plantar medial tubercle of the calcaneus at the insertion of plantar fascia on the left foot. There is no pain along the course of the plantar fascia within the arch of the foot. Plantar fascia appears to be intact. There is no pain with lateral compression of the calcaneus or pain with vibratory sensation. There is no pain along the course or insertion of the achilles tendon. No other areas of tenderness to bilateral lower extremities.Muscular strength 5/5 in all groups tested bilateral.  Gait: Unassisted, Nonantalgic.       Assessment:   Left heel pain, plantar fasciitis    Plan:  -Treatment options discussed including all alternatives, risks, and complications -Etiology of symptoms were discussed -X-rays were obtained and  reviewed with the patient.  No evidence of acute fracture or stress fracture identified today. -Steroid injection performed.  See procedure note below. -Plantar fascial brace dispensed -Stretching, icing exercises daily. -Discussed shoe modifications and orthotics.  Procedure: Injection Tendon/Ligament Discussed alternatives, risks, complications and verbal consent was obtained.  Location: Left plantar fascia at the glabrous junction; medial approach. Skin Prep: Alcohol. Injectate: 0.5cc 0.5% marcaine plain, 0.5 cc 2% lidocaine plain and, 1 cc kenalog 10. Disposition: Patient tolerated procedure well. Injection site dressed with a band-aid.  Post-injection care was discussed and return precautions discussed.   Trula Slade DPM

## 2018-07-22 DIAGNOSIS — N189 Chronic kidney disease, unspecified: Secondary | ICD-10-CM | POA: Diagnosis not present

## 2018-07-22 DIAGNOSIS — N183 Chronic kidney disease, stage 3 (moderate): Secondary | ICD-10-CM | POA: Diagnosis not present

## 2018-07-31 ENCOUNTER — Other Ambulatory Visit (INDEPENDENT_AMBULATORY_CARE_PROVIDER_SITE_OTHER): Payer: Self-pay | Admitting: Specialist

## 2018-07-31 DIAGNOSIS — M5442 Lumbago with sciatica, left side: Principal | ICD-10-CM

## 2018-07-31 DIAGNOSIS — G44329 Chronic post-traumatic headache, not intractable: Secondary | ICD-10-CM

## 2018-07-31 DIAGNOSIS — G8929 Other chronic pain: Secondary | ICD-10-CM

## 2018-08-01 NOTE — Telephone Encounter (Signed)
Gabapentin refill request 

## 2018-08-02 DIAGNOSIS — N3942 Incontinence without sensory awareness: Secondary | ICD-10-CM | POA: Diagnosis not present

## 2018-08-02 DIAGNOSIS — N3946 Mixed incontinence: Secondary | ICD-10-CM | POA: Diagnosis not present

## 2018-08-08 DIAGNOSIS — E7849 Other hyperlipidemia: Secondary | ICD-10-CM | POA: Diagnosis not present

## 2018-08-08 DIAGNOSIS — E038 Other specified hypothyroidism: Secondary | ICD-10-CM | POA: Diagnosis not present

## 2018-08-08 DIAGNOSIS — E1129 Type 2 diabetes mellitus with other diabetic kidney complication: Secondary | ICD-10-CM | POA: Diagnosis not present

## 2018-08-08 DIAGNOSIS — I10 Essential (primary) hypertension: Secondary | ICD-10-CM | POA: Diagnosis not present

## 2018-08-08 DIAGNOSIS — M109 Gout, unspecified: Secondary | ICD-10-CM | POA: Diagnosis not present

## 2018-08-09 DIAGNOSIS — R32 Unspecified urinary incontinence: Secondary | ICD-10-CM | POA: Diagnosis not present

## 2018-08-10 ENCOUNTER — Ambulatory Visit (INDEPENDENT_AMBULATORY_CARE_PROVIDER_SITE_OTHER): Payer: Medicare Other | Admitting: Orthopaedic Surgery

## 2018-08-15 DIAGNOSIS — R82998 Other abnormal findings in urine: Secondary | ICD-10-CM | POA: Diagnosis not present

## 2018-08-16 DIAGNOSIS — E038 Other specified hypothyroidism: Secondary | ICD-10-CM | POA: Diagnosis not present

## 2018-08-16 DIAGNOSIS — K7460 Unspecified cirrhosis of liver: Secondary | ICD-10-CM | POA: Diagnosis not present

## 2018-08-16 DIAGNOSIS — D6489 Other specified anemias: Secondary | ICD-10-CM | POA: Diagnosis not present

## 2018-08-16 DIAGNOSIS — E1129 Type 2 diabetes mellitus with other diabetic kidney complication: Secondary | ICD-10-CM | POA: Diagnosis not present

## 2018-08-16 DIAGNOSIS — I10 Essential (primary) hypertension: Secondary | ICD-10-CM | POA: Diagnosis not present

## 2018-08-16 DIAGNOSIS — N2889 Other specified disorders of kidney and ureter: Secondary | ICD-10-CM | POA: Diagnosis not present

## 2018-08-16 DIAGNOSIS — Z1212 Encounter for screening for malignant neoplasm of rectum: Secondary | ICD-10-CM | POA: Diagnosis not present

## 2018-08-16 DIAGNOSIS — Z0000 Encounter for general adult medical examination without abnormal findings: Secondary | ICD-10-CM | POA: Diagnosis not present

## 2018-08-16 DIAGNOSIS — N184 Chronic kidney disease, stage 4 (severe): Secondary | ICD-10-CM | POA: Diagnosis not present

## 2018-08-16 DIAGNOSIS — E7849 Other hyperlipidemia: Secondary | ICD-10-CM | POA: Diagnosis not present

## 2018-08-16 DIAGNOSIS — Z1389 Encounter for screening for other disorder: Secondary | ICD-10-CM | POA: Diagnosis not present

## 2018-08-16 DIAGNOSIS — Z6838 Body mass index (BMI) 38.0-38.9, adult: Secondary | ICD-10-CM | POA: Diagnosis not present

## 2018-08-16 DIAGNOSIS — G43909 Migraine, unspecified, not intractable, without status migrainosus: Secondary | ICD-10-CM | POA: Diagnosis not present

## 2018-08-18 ENCOUNTER — Ambulatory Visit (INDEPENDENT_AMBULATORY_CARE_PROVIDER_SITE_OTHER): Payer: Medicare Other | Admitting: Podiatry

## 2018-08-18 ENCOUNTER — Encounter: Payer: Self-pay | Admitting: Podiatry

## 2018-08-18 DIAGNOSIS — M722 Plantar fascial fibromatosis: Secondary | ICD-10-CM | POA: Diagnosis not present

## 2018-08-18 MED ORDER — TRIAMCINOLONE ACETONIDE 10 MG/ML IJ SUSP
10.0000 mg | Freq: Once | INTRAMUSCULAR | Status: AC
  Administered 2018-08-18: 10 mg

## 2018-08-18 MED ORDER — METHYLPREDNISOLONE 4 MG PO TBPK: ORAL_TABLET | ORAL | 0 refills | Status: DC

## 2018-08-21 DIAGNOSIS — M722 Plantar fascial fibromatosis: Secondary | ICD-10-CM | POA: Insufficient documentation

## 2018-08-21 NOTE — Progress Notes (Signed)
Subjective: 72 year old female presents the office today for follow-up evaluation of heel pain left side, plantar fasciitis.  She says the injection helped from a couple of days.  She is been doing stretching, icing as well as using a plantar fascial brace.  Over the pain is gotten somewhat better but does continue.  No recent injury or falls no swelling or redness. Denies any systemic complaints such as fevers, chills, nausea, vomiting. No acute changes since last appointment, and no other complaints at this time.   Objective: AAO x3, NAD DP/PT pulses palpable bilaterally, CRT less than 3 seconds Continuation of tenderness to palpation on the plantar medial tubercle of the calcaneus and insertion of plantar fashion the left foot.  Plantar fascial appears to be intact.  Achilles tendon intact.  No pain with lateral compression of the calcaneus. No open lesions or pre-ulcerative lesions.  No pain with calf compression, swelling, warmth, erythema  Assessment: Left heel pain, plantar fasciitis  Plan: -All treatment options discussed with the patient including all alternatives, risks, complications.  -A second steroid injection was performed today.  See procedure note below.  Medrol Dosepak was prescribed.  Continue stretching, icing daily.  Discussed shoe modifications and orthotics.  Also consider physical therapy. -Patient encouraged to call the office with any questions, concerns, change in symptoms.   Procedure: Injection Tendon/Ligament Discussed alternatives, risks, complications and verbal consent was obtained.  Location: LEFT plantar fascia at the glabrous junction; medial approach. Skin Prep: Alcohol. Injectate: 0.5cc 0.5% marcaine plain, 0.5 cc 2% lidocaine plain and, 1 cc kenalog 10. Disposition: Patient tolerated procedure well. Injection site dressed with a band-aid.  Post-injection care was discussed and return precautions discussed.   Trula Slade DPM

## 2018-08-22 ENCOUNTER — Other Ambulatory Visit: Payer: Self-pay | Admitting: Internal Medicine

## 2018-08-22 DIAGNOSIS — Z1231 Encounter for screening mammogram for malignant neoplasm of breast: Secondary | ICD-10-CM

## 2018-08-24 DIAGNOSIS — M4326 Fusion of spine, lumbar region: Secondary | ICD-10-CM | POA: Diagnosis not present

## 2018-08-29 ENCOUNTER — Other Ambulatory Visit (INDEPENDENT_AMBULATORY_CARE_PROVIDER_SITE_OTHER): Payer: Self-pay | Admitting: Specialist

## 2018-08-29 DIAGNOSIS — G8929 Other chronic pain: Secondary | ICD-10-CM

## 2018-08-29 DIAGNOSIS — G44329 Chronic post-traumatic headache, not intractable: Secondary | ICD-10-CM

## 2018-08-29 DIAGNOSIS — M5442 Lumbago with sciatica, left side: Principal | ICD-10-CM

## 2018-08-29 NOTE — Telephone Encounter (Signed)
Gabapentin refill request 

## 2018-09-12 ENCOUNTER — Ambulatory Visit (INDEPENDENT_AMBULATORY_CARE_PROVIDER_SITE_OTHER): Payer: Medicare Other | Admitting: Specialist

## 2018-09-12 ENCOUNTER — Encounter (INDEPENDENT_AMBULATORY_CARE_PROVIDER_SITE_OTHER): Payer: Self-pay | Admitting: Specialist

## 2018-09-12 VITALS — BP 153/72 | HR 75 | Ht 64.0 in | Wt 208.0 lb

## 2018-09-12 DIAGNOSIS — M2241 Chondromalacia patellae, right knee: Secondary | ICD-10-CM | POA: Diagnosis not present

## 2018-09-12 DIAGNOSIS — M25561 Pain in right knee: Secondary | ICD-10-CM | POA: Diagnosis not present

## 2018-09-12 DIAGNOSIS — G8929 Other chronic pain: Secondary | ICD-10-CM

## 2018-09-12 DIAGNOSIS — M4325 Fusion of spine, thoracolumbar region: Secondary | ICD-10-CM | POA: Diagnosis not present

## 2018-09-12 DIAGNOSIS — M722 Plantar fascial fibromatosis: Secondary | ICD-10-CM

## 2018-09-12 DIAGNOSIS — M222X1 Patellofemoral disorders, right knee: Secondary | ICD-10-CM | POA: Diagnosis not present

## 2018-09-12 MED ORDER — METHYLPREDNISOLONE ACETATE 40 MG/ML IJ SUSP
40.0000 mg | INTRAMUSCULAR | Status: AC | PRN
  Administered 2018-09-12: 40 mg via INTRA_ARTICULAR

## 2018-09-12 MED ORDER — BUPIVACAINE HCL 0.25 % IJ SOLN
4.0000 mL | INTRAMUSCULAR | Status: AC | PRN
  Administered 2018-09-12: 4 mL via INTRA_ARTICULAR

## 2018-09-12 NOTE — Patient Instructions (Addendum)
  Knee is suffering from osteoarthritis, only real proven treatments are Weight loss, No NSIADs like diclofenac, motrin and naprosyn due to renal condition and exercise helps. Well padded shoes help. Ice the knee 2-3 times a day 15-20 mins at a time. Terminal quadriceps strengthening exercises.  Use a chopat patella strap for the right knee. Knee is suffering from osteoarthritis, only real proven treatments are Weight loss, No NSIADs like diclofenac, motrin and naprosyn due to renal condition and exercise helps. Well padded shoes help. Ice the knee 2-3 times a day 15-20 mins at a time. Terminal quadriceps strengthening exercises.

## 2018-09-12 NOTE — Progress Notes (Addendum)
Office Visit Note   Patient: Becky Hobbs           Date of Birth: 1946/04/17           MRN: [id number] Visit Date: 09/12/2018              Requested by: Marton Redwood, MD [address] [place], [place] [address] PCP: Marton Redwood, MD   Assessment & Plan: Visit Diagnoses:  1. Chronic pain of right knee   2. Fusion of spine of thoracolumbar region     PlanKnee is suffering from osteoarthritis, only real proven treatments are Weight loss, No NSIADs like diclofenac, motrin and naprosyn due to renal condition and exercise helps. Well padded shoes help. Ice the knee 2-3 times a day 15-20 mins at a time. Terminal quadriceps strengthening exercises. Knee is suffering from osteoarthritis, only real proven treatments are Weight loss, No NSIADs like diclofenac, motrin and naprosyn due to renal condition and exercise helps. Well padded shoes help. Ice the knee 2-3 times a day 15-20 mins at a time. Terminal quadriceps strengthening exercises. Follow-Up Instructions: No follow-ups on file.   Orders:  No orders of the defined types were placed in this encounter.  No orders of the defined types were placed in this encounter.     Procedures: Large Joint Inj: R knee on 09/12/2018 3:04 PM Indications: pain Details: 25 G 1.5 in needle, anteromedial approach  Arthrogram: No  Medications: 40 mg methylPREDNISolone acetate 40 MG/ML; 4 mL bupivacaine 0.25 % Outcome: tolerated well, no immediate complications  bandaid applied Procedure, treatment alternatives, risks and benefits explained, specific risks discussed. Consent was given by the patient. Immediately prior to procedure a time out was called to verify the correct patient, procedure, equipment, support staff and site/side marked as required. Patient was prepped and draped in the usual sterile fashion.       Clinical Data: No additional findings.   Subjective: Chief Complaint  Patient presents with  . Right Knee -  Follow-up    Aching some today    72 year old female with history of right knee pain, with starting pain, present with first standing in the AM. She has pain with standing and walking. Pain was better with cortisone injection and this lasted several months. She has no popping or catching. She does not want surgery as her nephew had previously for a torn cartilage. She takes some tylenol. Has a history of renal insuffiency and is not able to take NSAIDs. Does use CBD and may wish to try this on the knee.   Review of Systems  Constitutional: Negative.   HENT: Negative.   Eyes: Negative.   Respiratory: Negative.   Cardiovascular: Negative.   Gastrointestinal: Negative.   Endocrine: Negative.   Genitourinary: Negative.   Musculoskeletal: Negative.   Skin: Negative.   Allergic/Immunologic: Negative.   Neurological: Negative.   Hematological: Negative.   Psychiatric/Behavioral: Negative.      Objective: Vital Signs: BP (!) 153/72 (BP Location: Left Arm, Patient Position: Sitting)   Pulse 75   Ht 5\' 4"  (1.626 m)   Wt 208 lb (94.3 kg)   BMI 35.70 kg/m   Physical Exam  Constitutional: She is oriented to person, place, and time. She appears well-developed and well-nourished.  HENT:  Head: Normocephalic and atraumatic.  Eyes: Pupils are equal, round, and reactive to light. EOM are normal.  Neck: Normal range of motion. Neck supple.  Pulmonary/Chest: Effort normal and breath sounds normal.  Abdominal: Soft. Bowel  sounds are normal.  Musculoskeletal: Normal range of motion.       Right knee: She exhibits effusion.  Neurological: She is alert and oriented to person, place, and time.  Skin: Skin is warm and dry.  Psychiatric: She has a normal mood and affect. Her behavior is normal. Judgment and thought content normal.    Right Knee Exam   Muscle Strength  The patient has normal right knee strength.  Tenderness  The patient is experiencing tenderness in the lateral joint line,  lateral retinaculum, medial retinaculum, medial joint line and patella.  Range of Motion  Extension: 5  Flexion: 130   Tests  McMurray:  Medial - negative Lateral - negative Varus: negative Valgus: positive Lachman:  Anterior - negative    Posterior - negative Drawer:  Anterior - negative    Posterior - negative Pivot shift: negative Patellar apprehension: positive  Other  Erythema: absent Scars: absent Sensation: normal Swelling: mild Effusion: effusion present      Specialty Comments:  No specialty comments available.  Imaging: No results found.   PMFS History: Patient Active Problem List   Diagnosis Date Noted  . History of lumbar fusion 08/03/2016    Priority: High    Class: Chronic  . Plantar fasciitis of left foot 08/21/2018  . Acute renal failure (ARF) ([facility]) 02/09/2018  . Chronic migraine without aura without status migrainosus, not intractable 06/16/2017  . Migraine 04/09/2016  . Acute-on-chronic kidney injury ([facility]) 09/07/2015  . Hypertension 09/07/2015  . Hypothyroidism 09/07/2015  . Gout 09/07/2015  . Low back pain 09/07/2015  . Obesity (BMI 30-39.9) 07/06/2013  . Headache 04/12/2013  . Concussion 04/12/2013  . Lumbar spinal stenosis 03/04/2012    Class: Diagnosis of   Past Medical History:  Diagnosis Date  . Anxiety    panic attacks  . Arthritis   . Asthma   . Blood transfusion    1973--with twins birth  . Diabetes mellitus    borderline  . H/O hiatal hernia    surgery fixed  . Heart murmur    MVP  . Hypertension   . Hypothyroidism     Family History  Problem Relation Age of Onset  . Kidney disease Mother   . Vascular Disease Mother     Past Surgical History:  Procedure Laterality Date  . ABDOMINAL HYSTERECTOMY    . APPENDECTOMY    . BACK SURGERY    . bladder tack    . BREAST CYST INCISION AND DRAINAGE    . BREAST SURGERY    . BUNIONECTOMY    . CHOLECYSTECTOMY    . FRACTURE SURGERY     left wrist  . LUMBAR FUSION   07/26/2017  . LUMBAR LAMINECTOMY  03/04/2012   Procedure: MICRODISCECTOMY LUMBAR LAMINECTOMY;  Surgeon: Jessy Oto, MD;  Location: [facility];  Service: Orthopedics;  Laterality: N/A;  L3-4 central laminectomy  . NASAL SEPTUM SURGERY    . nissan fundoplication    . OVARIAN CYST SURGERY    . THORACIC FUSION  07/26/2017  . TONSILLECTOMY     Social History   Occupational History  . Occupation: retired    Comment: Retired  Tobacco Use  . Smoking status: Never Smoker  . Smokeless tobacco: Never Used  Substance and Sexual Activity  . Alcohol use: No  . Drug use: No  . Sexual activity: Never

## 2018-09-21 DIAGNOSIS — N183 Chronic kidney disease, stage 3 (moderate): Secondary | ICD-10-CM | POA: Diagnosis not present

## 2018-09-21 DIAGNOSIS — N189 Chronic kidney disease, unspecified: Secondary | ICD-10-CM | POA: Diagnosis not present

## 2018-10-03 ENCOUNTER — Ambulatory Visit
Admission: RE | Admit: 2018-10-03 | Discharge: 2018-10-03 | Disposition: A | Discharge status: Home / Self Care | Payer: Medicare Other | Source: Ambulatory Visit | Attending: Internal Medicine | Admitting: Internal Medicine

## 2018-10-03 DIAGNOSIS — Z1231 Encounter for screening mammogram for malignant neoplasm of breast: Secondary | ICD-10-CM | POA: Diagnosis not present

## 2018-11-07 DIAGNOSIS — I129 Hypertensive chronic kidney disease with stage 1 through stage 4 chronic kidney disease, or unspecified chronic kidney disease: Secondary | ICD-10-CM | POA: Diagnosis not present

## 2018-11-07 DIAGNOSIS — B1920 Unspecified viral hepatitis C without hepatic coma: Secondary | ICD-10-CM | POA: Diagnosis not present

## 2018-11-07 DIAGNOSIS — N179 Acute kidney failure, unspecified: Secondary | ICD-10-CM | POA: Diagnosis not present

## 2018-11-07 DIAGNOSIS — E611 Iron deficiency: Secondary | ICD-10-CM | POA: Diagnosis not present

## 2018-11-07 DIAGNOSIS — E1122 Type 2 diabetes mellitus with diabetic chronic kidney disease: Secondary | ICD-10-CM | POA: Diagnosis not present

## 2018-11-07 DIAGNOSIS — E669 Obesity, unspecified: Secondary | ICD-10-CM | POA: Diagnosis not present

## 2018-11-07 DIAGNOSIS — F0781 Postconcussional syndrome: Secondary | ICD-10-CM | POA: Diagnosis not present

## 2018-11-07 DIAGNOSIS — R945 Abnormal results of liver function studies: Secondary | ICD-10-CM | POA: Diagnosis not present

## 2018-11-07 DIAGNOSIS — N183 Chronic kidney disease, stage 3 (moderate): Secondary | ICD-10-CM | POA: Diagnosis not present

## 2018-11-10 ENCOUNTER — Ambulatory Visit (INDEPENDENT_AMBULATORY_CARE_PROVIDER_SITE_OTHER): Payer: Medicare Other | Admitting: Specialist

## 2018-11-17 DIAGNOSIS — N3941 Urge incontinence: Secondary | ICD-10-CM | POA: Diagnosis not present

## 2018-11-24 DIAGNOSIS — N3941 Urge incontinence: Secondary | ICD-10-CM | POA: Diagnosis not present

## 2018-11-24 DIAGNOSIS — N3946 Mixed incontinence: Secondary | ICD-10-CM | POA: Diagnosis not present

## 2018-12-01 DIAGNOSIS — N3941 Urge incontinence: Secondary | ICD-10-CM | POA: Diagnosis not present

## 2018-12-08 DIAGNOSIS — N3941 Urge incontinence: Secondary | ICD-10-CM | POA: Diagnosis not present

## 2018-12-08 DIAGNOSIS — N183 Chronic kidney disease, stage 3 (moderate): Secondary | ICD-10-CM | POA: Diagnosis not present

## 2018-12-08 DIAGNOSIS — N189 Chronic kidney disease, unspecified: Secondary | ICD-10-CM | POA: Diagnosis not present

## 2018-12-16 DIAGNOSIS — N3941 Urge incontinence: Secondary | ICD-10-CM | POA: Diagnosis not present

## 2018-12-22 DIAGNOSIS — N3941 Urge incontinence: Secondary | ICD-10-CM | POA: Diagnosis not present

## 2018-12-26 ENCOUNTER — Other Ambulatory Visit: Payer: Self-pay

## 2018-12-26 ENCOUNTER — Emergency Department (HOSPITAL_COMMUNITY)
Admission: EM | Admit: 2018-12-26 | Discharge: 2018-12-26 | Disposition: A | Discharge status: Home / Self Care | Payer: Medicare Other | Attending: Emergency Medicine | Admitting: Emergency Medicine

## 2018-12-26 ENCOUNTER — Encounter (HOSPITAL_COMMUNITY): Payer: Self-pay | Admitting: Emergency Medicine

## 2018-12-26 DIAGNOSIS — R0989 Other specified symptoms and signs involving the circulatory and respiratory systems: Secondary | ICD-10-CM

## 2018-12-26 DIAGNOSIS — I10 Essential (primary) hypertension: Secondary | ICD-10-CM | POA: Diagnosis not present

## 2018-12-26 DIAGNOSIS — Z87891 Personal history of nicotine dependence: Secondary | ICD-10-CM | POA: Insufficient documentation

## 2018-12-26 DIAGNOSIS — E119 Type 2 diabetes mellitus without complications: Secondary | ICD-10-CM | POA: Diagnosis not present

## 2018-12-26 DIAGNOSIS — Z880 Allergy status to penicillin: Secondary | ICD-10-CM | POA: Diagnosis not present

## 2018-12-26 DIAGNOSIS — E039 Hypothyroidism, unspecified: Secondary | ICD-10-CM | POA: Insufficient documentation

## 2018-12-26 DIAGNOSIS — Z7982 Long term (current) use of aspirin: Secondary | ICD-10-CM | POA: Insufficient documentation

## 2018-12-26 DIAGNOSIS — J45909 Unspecified asthma, uncomplicated: Secondary | ICD-10-CM | POA: Insufficient documentation

## 2018-12-26 DIAGNOSIS — Z79899 Other long term (current) drug therapy: Secondary | ICD-10-CM | POA: Insufficient documentation

## 2018-12-26 MED ORDER — LIDOCAINE VISCOUS HCL 2 % MT SOLN
15.0000 mL | Freq: Once | OROMUCOSAL | Status: AC
  Administered 2018-12-26: 15 mL via OROMUCOSAL
  Filled 2018-12-26: qty 15

## 2018-12-26 MED ORDER — LIDOCAINE VISCOUS HCL 2 % MT SOLN: 15.0000 mL | Freq: Four times a day (QID) | OROMUCOSAL | 0 refills | Status: DC | PRN

## 2018-12-26 NOTE — ED Provider Notes (Addendum)
[facility] DEPT Provider Note  CSN: [id number] Arrival date & time: 12/26/18 9678  Chief Complaint(s) Airway Obstruction  HPI Becky Hobbs is a 73 y.o. female with a past medical history listed below who presents to the emergency department with sore throat.  The patient believes she has 1 of her pills which she took around 1400 p.m. yesterday stuck in her throat.  Pain is aching and sharp.  Exacerbated with swallowing.  No alleviating factors.  Patient reports that she is tried to dislodge the pill with bread and large volumes of water.  States that she believes she has been unsuccessful and feels like the pill is still stuck.  No trouble swallowing.  No emesis.  No chest pain or shortness of breath.  Denies any other physical complaints.  HPI  Past Medical History Past Medical History:  Diagnosis Date  . Anxiety    panic attacks  . Arthritis   . Asthma   . Blood transfusion    1973--with twins birth  . Diabetes mellitus    borderline  . H/O hiatal hernia    surgery fixed  . Heart murmur    MVP  . Hypertension   . Hypothyroidism    Patient Active Problem List   Diagnosis Date Noted  . Plantar fasciitis of left foot 08/21/2018  . Acute renal failure (ARF) ([place]) 02/09/2018  . Chronic migraine without aura without status migrainosus, not intractable 06/16/2017  . History of lumbar fusion 08/03/2016    Class: Chronic  . Migraine 04/09/2016  . Acute-on-chronic kidney injury ([place]) 09/07/2015  . Hypertension 09/07/2015  . Hypothyroidism 09/07/2015  . Gout 09/07/2015  . Low back pain 09/07/2015  . Obesity (BMI 30-39.9) 07/06/2013  . Headache 04/12/2013  . Concussion 04/12/2013  . Lumbar spinal stenosis 03/04/2012    Class: Diagnosis of   Home Medication(s) Prior to Admission medications   Medication Sig Start Date End Date Taking? Authorizing Provider  ACCU-CHEK FASTCLIX LANCETS MISC 2 (two) times daily. for testing 02/24/18   [provider]  albuterol (PROVENTIL HFA;VENTOLIN HFA) 108 (90 BASE) MCG/ACT inhaler Inhale 2 puffs into the lungs every 6 (six) hours as needed for wheezing or shortness of breath. 04/11/15   Melvenia Beam, MD  allopurinol (ZYLOPRIM) 100 MG tablet Take 100 mg by mouth daily.    [provider]  ALPRAZolam Duanne Moron) 0.5 MG tablet Take 0.5 mg by mouth daily as needed for anxiety.  02/13/13   [provider]  ALPRAZolam Duanne Moron) 0.5 MG tablet Take by mouth. 02/13/13   [provider]  aspirin EC 81 MG tablet Take 81 mg by mouth daily.    [provider]  bismuth subsalicylate (PEPTO BISMOL) 262 MG/15ML suspension Take 30 mLs by mouth every 6 (six) hours as needed for indigestion or diarrhea or loose stools.    [provider]  cetirizine (ZYRTEC) 10 MG tablet Take 10 mg by mouth daily.    [provider]  Cholecalciferol 5000 units TABS Take 5,000 Units by mouth daily.     [provider]  EPINEPHrine (EPIPEN 2-PAK) 0.3 mg/0.3 mL IJ SOAJ injection Inject 0.3 mLs (0.3 mg total) into the skin once. Patient taking differently: Inject 0.3 mg into the skin daily as needed (allergic reaction).  04/11/15   Melvenia Beam, MD  EPINEPHrine 0.3 mg/0.3 mL IJ SOAJ injection INJECT INTRAMUSCULARLY AS DIRECTED 01/19/18   [provider]  fluticasone (FLONASE) 50 MCG/ACT nasal spray INT 2 SPRAYS  INTO EACH NOSTRIL as needed 08/13/16   [provider]  furosemide (LASIX) 20 MG tablet Take 1 tablet (20 mg total) by mouth daily. Start in 2days on 5/8 02/14/18   Domenic Polite, MD  furosemide (LASIX) 20 MG tablet TAKE ONE TABLET BY MOUTH DAILY 12/19/17   [provider]  gabapentin (NEURONTIN) 300 MG capsule TAKE 1 CAPSULE BY MOUTH EVERY MORNING AND TAKE 2 CAPSULES EVERY NIGHT AT BEDTIME 02/03/18   [provider]  gabapentin (NEURONTIN) 300 MG capsule TAKE 1 CAPSULE BY MOUTH EVERY AM AND 2 CAPSULES EVERY NIGHT AT BEDTIME 08/30/18    Jessy Oto, MD  hydrALAZINE (APRESOLINE) 25 MG tablet Take 25 mg by mouth as needed.     [provider]  HYDROmorphone (DILAUDID) 2 MG tablet TK 1 T PO Q 6 H PRN FOR SEVERE PAIN FOR UP TO 5 DAYS 04/22/18   [provider]  levothyroxine (SYNTHROID, LEVOTHROID) 88 MCG tablet Take 88 mcg by mouth daily before breakfast.     [provider]  lidocaine (XYLOCAINE) 2 % solution Use as directed 15 mLs in the mouth or throat every 6 (six) hours as needed (throat pain). 12/26/18   Fatima Blank, MD  methylPREDNISolone (MEDROL DOSEPAK) 4 MG TBPK tablet Take as directed 08/18/18   Trula Slade, DPM  metoprolol succinate (TOPROL-XL) 100 MG 24 hr tablet Take 100 mg by mouth daily. Take with or immediately following a meal.    [provider]  Multiple Vitamin (MULITIVITAMIN WITH MINERALS) TABS Take 1 tablet by mouth daily.    [provider]  ondansetron (ZOFRAN) 4 MG tablet Take 1 tablet (4 mg total) by mouth every 8 (eight) hours as needed for nausea. 04/12/13   Marcial Pacas, MD  oxyCODONE-acetaminophen (PERCOCET/ROXICET) 5-325 MG per tablet Take 1 tablet by mouth every 6 (six) hours as needed for moderate pain.  01/25/15   [provider]  pantoprazole (PROTONIX) 40 MG tablet Take 40 mg by mouth daily.    [provider]  potassium chloride SA (K-DUR,KLOR-CON) 20 MEQ tablet Take 20 mEq by mouth daily as needed (fluid). Takes with torsemide.    [provider]  rOPINIRole (REQUIP) 0.5 MG tablet Take 0.5 mg by mouth as needed.  12/14/16   [provider]  rosuvastatin (CRESTOR) 10 MG tablet Take 10 mg by mouth daily.  06/30/17   [provider]  sulfamethoxazole-trimethoprim (BACTRIM DS,SEPTRA DS) 800-160 MG tablet TK 1 T PO  BID 04/22/18   [provider]  SUMAtriptan (IMITREX) 100 MG tablet TAKE 1 TABLET(100 MG) BY MOUTH 1 TIME AS NEEDED FOR MIGRAINE. MAY REPEAT IN 2 HOURS IF HEADACHE PERSISTS OR RECURS  04/19/17   Melvenia Beam, MD  tiZANidine (ZANAFLEX) 2 MG tablet TAKE 1 TO 2 TABLETS BY MOUTH EVERY 6 HOURS AS NEEDED FOR SPASM 02/04/17   Jessy Oto, MD  tiZANidine (ZANAFLEX) 2 MG tablet TAKE 1 TO 2 TABLETS BY MOUTH EVERY 6 HOURS AS NEEDED FOR SPASM 07/19/17   Jessy Oto, MD  TRULICITY 1.5 ES/9.2ZR SOPN Inject 1.5 mg as directed once a week. 01/20/18   [provider]  zolpidem (AMBIEN) 10 MG tablet Take 10 mg by mouth at bedtime as needed for sleep.  04/08/13   [provider]  Past Surgical History Past Surgical History:  Procedure Laterality Date  . ABDOMINAL HYSTERECTOMY    . APPENDECTOMY    . BACK SURGERY    . bladder tack    . BREAST CYST EXCISION    . BREAST CYST INCISION AND DRAINAGE    . BREAST SURGERY    . BUNIONECTOMY    . CHOLECYSTECTOMY    . FRACTURE SURGERY     left wrist  . LUMBAR FUSION  07/26/2017  . LUMBAR LAMINECTOMY  03/04/2012   Procedure: MICRODISCECTOMY LUMBAR LAMINECTOMY;  Surgeon: Jessy Oto, MD;  Location: [facility];  Service: Orthopedics;  Laterality: N/A;  L3-4 central laminectomy  . NASAL SEPTUM SURGERY    . nissan fundoplication    . OVARIAN CYST SURGERY    . THORACIC FUSION  07/26/2017  . TONSILLECTOMY     Family History Family History  Problem Relation Age of Onset  . Kidney disease Mother   . Vascular Disease Mother     Social History Social History   Tobacco Use  . Smoking status: Never Smoker  . Smokeless tobacco: Never Used  Substance Use Topics  . Alcohol use: No  . Drug use: No   Allergies Bee venom; Buprenorphine hcl; Erythromycin; Other; Penicillins; Hydrocodone; Morphine and related; Tramadol hcl; Tramadol hcl; and Hydrocodone-acetaminophen  Review of Systems Review of Systems All other systems are reviewed and are negative for acute change except as noted in the HPI   Physical Exam Vital Signs  I have reviewed the triage vital signs BP 147/88 (BP Location: Left Arm)   Pulse 86   Temp 98 F (36.7 C) (Oral)   Resp 15   Ht 5\' 4"  (1.626 m)   Wt 90.7 kg   SpO2 98%   BMI 34.33 kg/m   Physical Exam Vitals signs reviewed.  Constitutional:      General: She is not in acute distress.    Appearance: She is well-developed. She is not diaphoretic.  HENT:     Head: Normocephalic and atraumatic.     Nose: Nose normal.     Mouth/Throat:     Mouth: No oral lesions or angioedema.     Tongue: No lesions.     Palate: No lesions.     Pharynx: No pharyngeal swelling or posterior oropharyngeal erythema.     Tonsils: No tonsillar exudate.  Eyes:     General: No scleral icterus.       Right eye: No discharge.        Left eye: No discharge.     Conjunctiva/sclera: Conjunctivae normal.     Pupils: Pupils are equal, round, and reactive to light.  Neck:     Musculoskeletal: Normal range of motion and neck supple.  Cardiovascular:     Rate and Rhythm: Normal rate and regular rhythm.     Heart sounds: No murmur. No friction rub. No gallop.   Pulmonary:     Effort: Pulmonary effort is normal. No respiratory distress.     Breath sounds: Normal breath sounds. No stridor. No rales.  Abdominal:     General: There is no distension.     Palpations: Abdomen is soft.     Tenderness: There is no abdominal tenderness.  Musculoskeletal:        General: No tenderness.  Skin:    General: Skin is warm and dry.     Findings: No erythema or rash.  Neurological:     Mental Status: She is alert and oriented to person,  place, and time.     ED Results and Treatments Labs (all labs ordered are listed, but only abnormal results are displayed) Labs Reviewed - No data to display                                                                                                                       EKG  EKG Interpretation  Date/Time:    Ventricular Rate:    PR Interval:     QRS Duration:   QT Interval:    QTC Calculation:   R Axis:     Text Interpretation:        Radiology No results found. Pertinent labs & imaging results that were available during my care of the patient were reviewed by me and considered in my medical decision making (see chart for details).  Medications Ordered in ED Medications  lidocaine (XYLOCAINE) 2 % viscous mouth solution 15 mL (15 mLs Mouth/Throat Given 12/26/18 0536)                                                                                                                                    Procedures Fiberoptic laryngoscopy Date/Time: 12/26/2018 5:50 AM Performed by: Fatima Blank, MD Authorized by: Fatima Blank, MD  Consent: Verbal consent obtained. Consent given by: patient Patient understanding: patient states understanding of the procedure being performed Patient identity confirmed: verbally with patient Time out: Immediately prior to procedure a "time out" was called to verify the correct patient, procedure, equipment, support staff and site/side marked as required. Local anesthesia used: yes  Anesthesia: Local anesthesia used: yes Local anesthetic: viscous lidocaine. Anesthetic total: 15 mL  Sedation: Patient sedated: no  Patient tolerance: Patient tolerated the procedure well with no immediate complications Comments: No retained FB noted. Small area of irritation to right vallecula. No edema or laceration noted     (including critical care time)  Medical Decision Making / ED Course I have reviewed the nursing notes for this encounter and the patient's prior records (if available in EHR or on provided paperwork).    Throat irritation from swallowing pills. No oropharynx edema, retained FB noted on fiberoptic laryngoscopy. Patient able to swallow w/o complication, though endorses odynophagia.   No respiratory distress.   Final Clinical Impression(s) / ED Diagnoses Final  diagnoses:  Foreign body sensation in throat    Disposition: Discharge  Condition: Good  I have discussed  the results, Dx and Tx plan with the patient and son who expressed understanding and agree(s) with the plan. Discharge instructions discussed at great length. The patient and son was given strict return precautions who verbalized understanding of the instructions. No further questions at time of discharge.    ED Discharge Orders         Ordered    lidocaine (XYLOCAINE) 2 % solution  Every 6 hours PRN     12/26/18 0602           Follow Up: Marton Redwood, MD [place] [phone]   As needed     This chart was dictated using voice recognition software.  Despite best efforts to proofread,  errors can occur which can change the documentation meaning.     Fatima Blank, MD 12/26/18 [phone]

## 2018-12-26 NOTE — ED Triage Notes (Signed)
Patient has a pill stuck in her throat. Patient is having trouble talking.

## 2019-01-02 DIAGNOSIS — R0989 Other specified symptoms and signs involving the circulatory and respiratory systems: Secondary | ICD-10-CM | POA: Diagnosis not present

## 2019-01-02 DIAGNOSIS — K219 Gastro-esophageal reflux disease without esophagitis: Secondary | ICD-10-CM | POA: Diagnosis not present

## 2019-01-02 DIAGNOSIS — R072 Precordial pain: Secondary | ICD-10-CM | POA: Diagnosis not present

## 2019-01-02 DIAGNOSIS — R1310 Dysphagia, unspecified: Secondary | ICD-10-CM | POA: Diagnosis not present

## 2019-01-03 ENCOUNTER — Ambulatory Visit (HOSPITAL_COMMUNITY): Payer: Medicare Other | Admitting: Anesthesiology

## 2019-01-03 ENCOUNTER — Ambulatory Visit (HOSPITAL_COMMUNITY)
Admission: RE | Admit: 2019-01-03 | Discharge: 2019-01-03 | Disposition: A | Discharge status: Home / Self Care | Payer: Medicare Other | Source: Other Acute Inpatient Hospital | Attending: Gastroenterology | Admitting: Gastroenterology

## 2019-01-03 ENCOUNTER — Encounter (HOSPITAL_COMMUNITY): Payer: Self-pay | Admitting: *Deleted

## 2019-01-03 ENCOUNTER — Other Ambulatory Visit: Payer: Self-pay

## 2019-01-03 ENCOUNTER — Encounter (HOSPITAL_COMMUNITY): Admission: RE | Disposition: A | Discharge status: Home / Self Care | Payer: Self-pay | Attending: Gastroenterology

## 2019-01-03 DIAGNOSIS — Z7982 Long term (current) use of aspirin: Secondary | ICD-10-CM | POA: Diagnosis not present

## 2019-01-03 DIAGNOSIS — X58XXXA Exposure to other specified factors, initial encounter: Secondary | ICD-10-CM | POA: Insufficient documentation

## 2019-01-03 DIAGNOSIS — T182XXA Foreign body in stomach, initial encounter: Secondary | ICD-10-CM | POA: Diagnosis not present

## 2019-01-03 DIAGNOSIS — Z79890 Hormone replacement therapy (postmenopausal): Secondary | ICD-10-CM | POA: Diagnosis not present

## 2019-01-03 DIAGNOSIS — Z79899 Other long term (current) drug therapy: Secondary | ICD-10-CM | POA: Insufficient documentation

## 2019-01-03 DIAGNOSIS — K449 Diaphragmatic hernia without obstruction or gangrene: Secondary | ICD-10-CM | POA: Diagnosis not present

## 2019-01-03 DIAGNOSIS — F410 Panic disorder [episodic paroxysmal anxiety] without agoraphobia: Secondary | ICD-10-CM | POA: Insufficient documentation

## 2019-01-03 DIAGNOSIS — Z6834 Body mass index (BMI) 34.0-34.9, adult: Secondary | ICD-10-CM | POA: Insufficient documentation

## 2019-01-03 DIAGNOSIS — T188XXA Foreign body in other parts of alimentary tract, initial encounter: Secondary | ICD-10-CM | POA: Diagnosis not present

## 2019-01-03 DIAGNOSIS — I10 Essential (primary) hypertension: Secondary | ICD-10-CM | POA: Insufficient documentation

## 2019-01-03 DIAGNOSIS — J45909 Unspecified asthma, uncomplicated: Secondary | ICD-10-CM | POA: Diagnosis not present

## 2019-01-03 DIAGNOSIS — E039 Hypothyroidism, unspecified: Secondary | ICD-10-CM | POA: Insufficient documentation

## 2019-01-03 DIAGNOSIS — K2970 Gastritis, unspecified, without bleeding: Secondary | ICD-10-CM | POA: Diagnosis not present

## 2019-01-03 DIAGNOSIS — R1310 Dysphagia, unspecified: Secondary | ICD-10-CM | POA: Diagnosis not present

## 2019-01-03 DIAGNOSIS — K2930 Chronic superficial gastritis without bleeding: Secondary | ICD-10-CM | POA: Diagnosis not present

## 2019-01-03 DIAGNOSIS — T18198A Other foreign object in esophagus causing other injury, initial encounter: Secondary | ICD-10-CM | POA: Diagnosis not present

## 2019-01-03 DIAGNOSIS — K2210 Ulcer of esophagus without bleeding: Secondary | ICD-10-CM | POA: Insufficient documentation

## 2019-01-03 HISTORY — PX: FOREIGN BODY REMOVAL: SHX962

## 2019-01-03 HISTORY — PX: ESOPHAGOGASTRODUODENOSCOPY (EGD) WITH PROPOFOL: SHX5813

## 2019-01-03 SURGERY — ESOPHAGOGASTRODUODENOSCOPY (EGD) WITH PROPOFOL
Anesthesia: General

## 2019-01-03 MED ORDER — SODIUM CHLORIDE 0.9 % IV SOLN: INTRAVENOUS | Status: DC

## 2019-01-03 MED ORDER — ESMOLOL HCL 100 MG/10ML IV SOLN
INTRAVENOUS | Status: DC | PRN
  Administered 2019-01-03: 30 mg via INTRAVENOUS

## 2019-01-03 MED ORDER — ONDANSETRON HCL 4 MG/2ML IJ SOLN
INTRAMUSCULAR | Status: DC | PRN
  Administered 2019-01-03: 4 mg via INTRAVENOUS

## 2019-01-03 MED ORDER — SUCCINYLCHOLINE CHLORIDE 20 MG/ML IJ SOLN
INTRAMUSCULAR | Status: DC | PRN
  Administered 2019-01-03: 100 mg via INTRAVENOUS

## 2019-01-03 MED ORDER — LACTATED RINGERS IV SOLN
INTRAVENOUS | Status: DC | PRN
  Administered 2019-01-03: 14:00:00 via INTRAVENOUS

## 2019-01-03 MED ORDER — PROPOFOL 10 MG/ML IV BOLUS
INTRAVENOUS | Status: DC | PRN
  Administered 2019-01-03: 50 mg via INTRAVENOUS
  Administered 2019-01-03 (×2): 30 mg via INTRAVENOUS
  Administered 2019-01-03: 50 mg via INTRAVENOUS
  Administered 2019-01-03: 120 mg via INTRAVENOUS

## 2019-01-03 MED ORDER — LIDOCAINE 2% (20 MG/ML) 5 ML SYRINGE
INTRAMUSCULAR | Status: DC | PRN
  Administered 2019-01-03: 100 mg via INTRAVENOUS

## 2019-01-03 SURGICAL SUPPLY — 15 items

## 2019-01-03 NOTE — Transfer of Care (Signed)
Immediate Anesthesia Transfer of Care Note  Patient: Becky Hobbs  Procedure(s) Performed: ESOPHAGOGASTRODUODENOSCOPY (EGD) WITH PROPOFOL (N/A ) FOREIGN BODY REMOVAL  Patient Location: Endoscopy Unit  Anesthesia Type:General  Level of Consciousness: awake, alert , oriented and patient cooperative  Airway & Oxygen Therapy: Patient Spontanous Breathing and Patient connected to face mask oxygen  Post-op Assessment: Report given to RN and Post -op Vital signs reviewed and stable  Post vital signs: Reviewed and stable  Last Vitals:  Vitals Value Taken Time  BP 146/59 01/03/2019  3:03 PM  Temp 36.6 C 01/03/2019  3:03 PM  Pulse 79 01/03/2019  3:04 PM  Resp 13 01/03/2019  3:04 PM  SpO2 100 % 01/03/2019  3:04 PM  Vitals shown include unvalidated device data.  Last Pain:  Vitals:   01/03/19 1255  TempSrc: Oral  PainSc: 7       Patients Stated Pain Goal: 2 (36/06/77 0340)  Complications: No apparent anesthesia complications

## 2019-01-03 NOTE — Discharge Instructions (Signed)

## 2019-01-03 NOTE — H&P (Signed)
Primary Care Physician:  Marton Redwood, MD Primary Gastroenterologist:  Dr. Alessandra Bevels  Reason for Visit : Foreign body removal  HPI: Becky Hobbs is a 73 y.o. female was seen in the office yesterday for evaluation of odynophagia and possible dysphasia.  She swallowed medication 1 week ago and had a sensation of 1 of the medication getting stuck in the esophagus.  Since then she has been having significant substernal pain which is worse with the swallowing.  Underwent EGD earlier at [facility] endoscopy center.  She was found to have blister pack ( of Myrbetriq) stuck few centimeter above the GE junction with blood clot surrounding it.  This was pushed down into the stomach.  She was noted to have 3 ulcers at this site in the esophagus.  There is mild gastritis in the stomach.  Discussed with our endoscopy supervisor .  We do  not have  over tube at our endoscopy center and it was felt not safe to remove foreign body with sharp edges at our endoscopy center hence she was advised to come to [facility] for EGD to remove foreign body.  Patient seen and examined at bedside   She is feeling somewhat better now.  Sharp substernal pain resolved now.   Past Medical History:  Diagnosis Date  . Anxiety    panic attacks  . Arthritis   . Asthma   . Blood transfusion    1973--with twins birth  . Diabetes mellitus    borderline  . H/O hiatal hernia    surgery fixed  . Heart murmur    MVP  . Hypertension   . Hypothyroidism     Past Surgical History:  Procedure Laterality Date  . ABDOMINAL HYSTERECTOMY    . APPENDECTOMY    . BACK SURGERY    . bladder tack    . BREAST CYST EXCISION    . BREAST CYST INCISION AND DRAINAGE    . BREAST SURGERY    . BUNIONECTOMY    . CHOLECYSTECTOMY    . FRACTURE SURGERY     left wrist  . LUMBAR FUSION  07/26/2017  . LUMBAR LAMINECTOMY  03/04/2012   Procedure: MICRODISCECTOMY LUMBAR LAMINECTOMY;  Surgeon: Jessy Oto, MD;  Location: [facility];  Service:  Orthopedics;  Laterality: N/A;  L3-4 central laminectomy  . NASAL SEPTUM SURGERY    . nissan fundoplication    . OVARIAN CYST SURGERY    . THORACIC FUSION  07/26/2017  . TONSILLECTOMY      Prior to Admission medications   Medication Sig Start Date End Date Taking? Authorizing Provider  ACCU-CHEK FASTCLIX LANCETS MISC 2 (two) times daily. for testing 02/24/18  Yes [provider]  albuterol (PROVENTIL HFA;VENTOLIN HFA) 108 (90 BASE) MCG/ACT inhaler Inhale 2 puffs into the lungs every 6 (six) hours as needed for wheezing or shortness of breath. 04/11/15  Yes Melvenia Beam, MD  allopurinol (ZYLOPRIM) 100 MG tablet Take 100 mg by mouth daily.   Yes [provider]  ALPRAZolam Duanne Moron) 0.5 MG tablet Take 0.5 mg by mouth daily as needed for anxiety.  02/13/13  Yes [provider]  ALPRAZolam Duanne Moron) 0.5 MG tablet Take by mouth. 02/13/13  Yes [provider]  aspirin EC 81 MG tablet Take 81 mg by mouth daily.   Yes [provider]  bismuth subsalicylate (PEPTO BISMOL) 262 MG/15ML suspension Take 30 mLs by mouth every 6 (six) hours as needed for indigestion or diarrhea or loose stools.   Yes  [provider]  cetirizine (ZYRTEC) 10 MG tablet Take 10 mg by mouth daily.   Yes [provider]  Cholecalciferol 5000 units TABS Take 5,000 Units by mouth daily.    Yes [provider]  furosemide (LASIX) 20 MG tablet Take 1 tablet (20 mg total) by mouth daily. Start in 2days on 5/8 02/14/18  Yes Domenic Polite, MD  furosemide (LASIX) 20 MG tablet TAKE ONE TABLET BY MOUTH DAILY 12/19/17  Yes [provider]  gabapentin (NEURONTIN) 300 MG capsule TAKE 1 CAPSULE BY MOUTH EVERY MORNING AND TAKE 2 CAPSULES EVERY NIGHT AT BEDTIME 02/03/18  Yes [provider]  gabapentin (NEURONTIN) 300 MG capsule TAKE 1 CAPSULE BY MOUTH EVERY AM AND 2 CAPSULES EVERY NIGHT AT BEDTIME 08/30/18  Yes Jessy Oto, MD  hydrALAZINE (APRESOLINE) 25 MG  tablet Take 25 mg by mouth as needed.    Yes [provider]  HYDROmorphone (DILAUDID) 2 MG tablet TK 1 T PO Q 6 H PRN FOR SEVERE PAIN FOR UP TO 5 DAYS 04/22/18  Yes [provider]  levothyroxine (SYNTHROID, LEVOTHROID) 88 MCG tablet Take 88 mcg by mouth daily before breakfast.    Yes [provider]  lidocaine (XYLOCAINE) 2 % solution Use as directed 15 mLs in the mouth or throat every 6 (six) hours as needed (throat pain). 12/26/18  Yes Cardama, Grayce Sessions, MD  methylPREDNISolone (MEDROL DOSEPAK) 4 MG TBPK tablet Take as directed 08/18/18  Yes Trula Slade, DPM  metoprolol succinate (TOPROL-XL) 100 MG 24 hr tablet Take 100 mg by mouth daily. Take with or immediately following a meal.   Yes [provider]  Multiple Vitamin (MULITIVITAMIN WITH MINERALS) TABS Take 1 tablet by mouth daily.   Yes [provider]  ondansetron (ZOFRAN) 4 MG tablet Take 1 tablet (4 mg total) by mouth every 8 (eight) hours as needed for nausea. 04/12/13  Yes Marcial Pacas, MD  oxyCODONE-acetaminophen (PERCOCET/ROXICET) 5-325 MG per tablet Take 1 tablet by mouth every 6 (six) hours as needed for moderate pain.  01/25/15  Yes [provider]  pantoprazole (PROTONIX) 40 MG tablet Take 40 mg by mouth daily.   Yes [provider]  potassium chloride SA (K-DUR,KLOR-CON) 20 MEQ tablet Take 20 mEq by mouth daily as needed (fluid). Takes with torsemide.   Yes [provider]  rOPINIRole (REQUIP) 0.5 MG tablet Take 0.5 mg by mouth as needed.  12/14/16  Yes [provider]  rosuvastatin (CRESTOR) 10 MG tablet Take 10 mg by mouth daily.  06/30/17  Yes [provider]  sulfamethoxazole-trimethoprim (BACTRIM DS,SEPTRA DS) 800-160 MG tablet TK 1 T PO  BID 04/22/18  Yes [provider]  SUMAtriptan (IMITREX) 100 MG tablet TAKE 1 TABLET(100 MG) BY MOUTH 1 TIME AS NEEDED FOR MIGRAINE. MAY REPEAT IN 2 HOURS IF HEADACHE PERSISTS OR RECURS 04/19/17  Yes  Sarina Ill B, MD  tiZANidine (ZANAFLEX) 2 MG tablet TAKE 1 TO 2 TABLETS BY MOUTH EVERY 6 HOURS AS NEEDED FOR SPASM 02/04/17  Yes Jessy Oto, MD  tiZANidine (ZANAFLEX) 2 MG tablet TAKE 1 TO 2 TABLETS BY MOUTH EVERY 6 HOURS AS NEEDED FOR SPASM 07/19/17  Yes Jessy Oto, MD  TRULICITY 1.5 YH/0.6CB SOPN Inject 1.5 mg as directed once a week. 01/20/18  Yes [provider]  zolpidem (AMBIEN) 10 MG tablet Take 10 mg by mouth at bedtime as needed for sleep.  04/08/13  Yes [provider]  EPINEPHrine (EPIPEN 2-PAK)  0.3 mg/0.3 mL IJ SOAJ injection Inject 0.3 mLs (0.3 mg total) into the skin once. Patient taking differently: Inject 0.3 mg into the skin daily as needed (allergic reaction).  04/11/15   Melvenia Beam, MD  EPINEPHrine 0.3 mg/0.3 mL IJ SOAJ injection INJECT INTRAMUSCULARLY AS DIRECTED 01/19/18   [provider]  fluticasone (FLONASE) 50 MCG/ACT nasal spray INT 2 SPRAYS INTO EACH NOSTRIL as needed 08/13/16   [provider]    Scheduled Meds: Continuous Infusions: . sodium chloride     PRN Meds:.  Allergies as of 01/03/2019 - Review Complete 01/03/2019  Allergen Reaction Noted  . Bee venom Anaphylaxis 02/09/2018  . Buprenorphine hcl Hives and Itching 08/08/2015  . Erythromycin Hives and Itching 05/12/2012  . Other Anaphylaxis 07/10/2014  . Penicillins Hives 03/01/2012  . Hydrocodone Itching 03/01/2012  . Morphine and related Hives and Itching 03/04/2012  . Tramadol hcl Itching 01/04/2014  . Tramadol hcl Itching 08/08/2015  . Hydrocodone-acetaminophen Itching 08/08/2015    Family History  Problem Relation Age of Onset  . Kidney disease Mother   . Vascular Disease Mother     Social History   Socioeconomic History  . Marital status: Widowed    Spouse name: Not on file  . Number of children: 3  . Years of education: college  . Highest education level: Not on file  Occupational History  . Occupation: retired    Comment: Retired   Scientific laboratory technician  . Financial resource strain: Not on file  . Food insecurity:    Worry: Not on file    Inability: Not on file  . Transportation needs:    Medical: Not on file    Non-medical: Not on file  Tobacco Use  . Smoking status: Never Smoker  . Smokeless tobacco: Never Used  Substance and Sexual Activity  . Alcohol use: No  . Drug use: No  . Sexual activity: Never  Lifestyle  . Physical activity:    Days per week: Not on file    Minutes per session: Not on file  . Stress: Not on file  Relationships  . Social connections:    Talks on phone: Not on file    Gets together: Not on file    Attends religious service: Not on file    Active member of club or organization: Not on file    Attends meetings of clubs or organizations: Not on file    Relationship status: Not on file  . Intimate partner violence:    Fear of current or ex partner: Not on file    Emotionally abused: Not on file    Physically abused: Not on file    Forced sexual activity: Not on file  Other Topics Concern  . Not on file  Social History Narrative   Patient has a high school education.    Patient is widow.   Patient is retired.    Review of Systems: All negative except as stated above in HPI.  Physical Exam: Vital signs: Vitals:   01/03/19 1255  BP: (!) 183/65  Pulse: 68  Resp: 13  Temp: 97.7 F (36.5 C)  SpO2: 100%     General:   Alert,  Well-developed, well-nourished, pleasant and cooperative in NAD Lungs:  Clear throughout to auscultation.   No wheezes, crackles, or rhonchi. No acute distress. Heart:  Regular rate and rhythm; no murmurs, clicks, rubs,  or gallops. Abdomen:  Soft, NT, ND, BS +  Rectal:  Deferred  GI:  Lab Results:  No results for input(s): WBC, HGB, HCT, PLT in the last 72 hours. BMET No results for input(s): NA, K, CL, CO2, GLUCOSE, BUN, CREATININE, CALCIUM in the last 72 hours. LFT No results for input(s): PROT, ALBUMIN, AST, ALT, ALKPHOS, BILITOT, BILIDIR, IBILI  in the last 72 hours. PT/INR No results for input(s): LABPROT, INR in the last 72 hours.   Studies/Results: No results found.  Impression/Plan:-  - Foreign body in the stomach. -Blister pack of one of her medication with sharp edges. -Esophageal ulcers.  Secondary to pill esophagitis versus trauma from blister pack.  Recommendations ---------------------- -Proceed with EGD to remove foreign body.  Risk of migration of foreign body towards  small intestine discussed with the patient.  Risk of bleeding with possible esophageal tear while removing  foreign body also discussed with the patient and patient's son.  They verbalized understanding.  Risks (bleeding, infection, bowel perforation that could require surgery, sedation-related changes in cardiopulmonary systems), benefits (identification and possible treatment of source of symptoms, exclusion of certain causes of symptoms), and alternatives (watchful waiting, radiographic imaging studies, empiric medical treatment)  were explained to patient/family in detail and patient wishes to proceed.    LOS: 0 days   Otis Brace  MD, FACP 01/03/2019, 1:21 PM  Contact #  [phone]

## 2019-01-03 NOTE — Op Note (Signed)
[facility] Patient Name: Becky Hobbs Procedure Date : 01/03/2019 MRN: [id number] Attending MD: Otis Brace , MD Date of Birth: 1946/06/25 CSN: [id number] Age: 73 Admit Type: Outpatient Procedure:                Upper GI endoscopy Indications:              Foreign body in the stomach Providers:                Otis Brace, MD, Baird Cancer, RN, Grace Isaac, RN, Elspeth Cho Tech., Technician,                            Gershon Crane CRNA, CRNA Referring MD:              Medicines:                See the Anesthesia note for documentation of the                            administered medications Complications:            No immediate complications. Estimated Blood Loss:     Estimated blood loss was minimal. Procedure:                Pre-Anesthesia Assessment:                           - Prior to the procedure, a History and Physical                            was performed, and patient medications and                            allergies were reviewed. The patient's tolerance of                            previous anesthesia was also reviewed. The risks                            and benefits of the procedure and the sedation                            options and risks were discussed with the patient.                            All questions were answered, and informed consent                            was obtained. Prior Anticoagulants: The patient has                            taken aspirin. ASA Grade Assessment: II - A patient  with mild systemic disease. After reviewing the                            risks and benefits, the patient was deemed in                            satisfactory condition to undergo the procedure.                           After obtaining informed consent, the endoscope was                            passed under direct vision. Throughout the   procedure, the patient's blood pressure, pulse, and                            oxygen saturations were monitored continuously. The                            [id number]-H190 ([id number]) Olympus gastroscope was                            introduced through the mouth, and advanced to the                            second part of duodenum. The upper GI endoscopy was                            technically difficult and complex. The patient                            tolerated the procedure well. Scope In: Scope Out: Findings:      Few superficial esophageal ulcers were found in the distal esophagus.      Blister pack were found in the gastric antrum. Removal was accomplished       with a rat-toothed forceps, Roth net and snare. Initially attempt was       made to remove foreign body with Talon Grasper without any success.       Subsequently snare was used, again without any success. Foreign body was       then grabbed with the Jabier Mutton net and pulled into the esophagus. I was not       able to pull it out through the upper esophageal sphincter. Subsequently       it was removed from upper esophagus sphincter using rat-toothed       forcep.Foreign body was approximately 2 cm in size.      Inflammation was found in the gastric body.      The duodenal bulb, first portion of the duodenum and second portion of       the duodenum were normal.      - Scope was reinserted after removing foreign body. No significant tear       or bleeding was seen. Impression:               - Esophageal ulcers.                           -  Blister pack were found in the stomach. Removal                            was successful.                           - Gastritis.                           - Normal duodenal bulb, first portion of the                            duodenum and second portion of the duodenum. Recommendation:           - Patient has a contact number available for                            emergencies. The signs  and symptoms of potential                            delayed complications were discussed with the                            patient. Return to normal activities tomorrow.                            Written discharge instructions were provided to the                            patient.                           - Resume previous diet.                           - Continue present medications.                           - Return to my office in 3 months. Procedure Code(s):        --- Professional ---                           [phone], Esophagogastroduodenoscopy, flexible,                            transoral; with removal of foreign body(s) Diagnosis Code(s):        --- Professional ---                           K22.10, Ulcer of esophagus without bleeding                           T18.2XXA, Foreign body in stomach, initial encounter                           K29.70, Gastritis, unspecified, without bleeding CPT copyright 2018 American Medical Association. All rights reserved.  The codes documented in this report are preliminary and upon coder review may  be revised to meet current compliance requirements. Otis Brace, MD Otis Brace, MD 01/03/2019 3:00:14 PM Number of Addenda: 0

## 2019-01-03 NOTE — Anesthesia Preprocedure Evaluation (Signed)
Anesthesia Evaluation  Patient identified by MRN, date of birth, ID band Patient awake    Reviewed: Allergy & Precautions, NPO status , Patient's Chart, lab work & pertinent test results  History of Anesthesia Complications Negative for: history of anesthetic complications  Airway Mallampati: II  TM Distance: >3 FB Neck ROM: Full    Dental  (+) Teeth Intact   Pulmonary asthma ,    breath sounds clear to auscultation       Cardiovascular hypertension, + Valvular Problems/Murmurs MVP  Rhythm:Regular     Neuro/Psych  Headaches, PSYCHIATRIC DISORDERS Anxiety    GI/Hepatic Neg liver ROS, hiatal hernia,  foreign body    Endo/Other  diabetesHypothyroidism Morbid obesity  Renal/GU negative Renal ROS  negative genitourinary   Musculoskeletal  (+) Arthritis ,   Abdominal   Peds  Hematology negative hematology ROS (+)   Anesthesia Other Findings   Reproductive/Obstetrics                             Anesthesia Physical Anesthesia Plan  ASA: II  Anesthesia Plan: General   Post-op Pain Management:    Induction: Intravenous, Rapid sequence and Cricoid pressure planned  PONV Risk Score and Plan: 3 and Ondansetron and Dexamethasone  Airway Management Planned: Oral ETT  Additional Equipment: None  Intra-op Plan:   Post-operative Plan: Extubation in OR  Informed Consent: I have reviewed the patients History and Physical, chart, labs and discussed the procedure including the risks, benefits and alternatives for the proposed anesthesia with the patient or authorized representative who has indicated his/her understanding and acceptance.     Dental advisory given  Plan Discussed with: CRNA and Surgeon  Anesthesia Plan Comments:         Anesthesia Quick Evaluation

## 2019-01-04 ENCOUNTER — Encounter (HOSPITAL_COMMUNITY): Payer: Self-pay | Admitting: Gastroenterology

## 2019-01-05 NOTE — Anesthesia Postprocedure Evaluation (Signed)
Anesthesia Post Note  Patient: Becky Hobbs  Procedure(s) Performed: ESOPHAGOGASTRODUODENOSCOPY (EGD) WITH PROPOFOL (N/A ) FOREIGN BODY REMOVAL     Patient location during evaluation: Endoscopy Anesthesia Type: General Level of consciousness: awake and alert Pain management: pain level controlled Vital Signs Assessment: post-procedure vital signs reviewed and stable Respiratory status: spontaneous breathing, nonlabored ventilation, respiratory function stable and patient connected to nasal cannula oxygen Cardiovascular status: blood pressure returned to baseline and stable Postop Assessment: no apparent nausea or vomiting Anesthetic complications: no    Last Vitals:  Vitals:   01/03/19 1522 01/03/19 1525  BP: (!) 174/56 (!) 174/43  Pulse: 71 66  Resp: (!) 9 14  Temp:    SpO2: 100% 100%    Last Pain:  Vitals:   01/03/19 1505  TempSrc:   PainSc: 0-No pain                 Katrinna Travieso

## 2019-01-09 DIAGNOSIS — K2930 Chronic superficial gastritis without bleeding: Secondary | ICD-10-CM | POA: Diagnosis not present

## 2019-02-13 DIAGNOSIS — N184 Chronic kidney disease, stage 4 (severe): Secondary | ICD-10-CM | POA: Diagnosis not present

## 2019-02-13 DIAGNOSIS — E039 Hypothyroidism, unspecified: Secondary | ICD-10-CM | POA: Diagnosis not present

## 2019-02-13 DIAGNOSIS — E1129 Type 2 diabetes mellitus with other diabetic kidney complication: Secondary | ICD-10-CM | POA: Diagnosis not present

## 2019-02-13 DIAGNOSIS — I129 Hypertensive chronic kidney disease with stage 1 through stage 4 chronic kidney disease, or unspecified chronic kidney disease: Secondary | ICD-10-CM | POA: Diagnosis not present

## 2019-02-13 DIAGNOSIS — D649 Anemia, unspecified: Secondary | ICD-10-CM | POA: Diagnosis not present

## 2019-02-13 DIAGNOSIS — E785 Hyperlipidemia, unspecified: Secondary | ICD-10-CM | POA: Diagnosis not present

## 2019-02-13 DIAGNOSIS — K219 Gastro-esophageal reflux disease without esophagitis: Secondary | ICD-10-CM | POA: Diagnosis not present

## 2019-02-22 DIAGNOSIS — E038 Other specified hypothyroidism: Secondary | ICD-10-CM | POA: Diagnosis not present

## 2019-02-22 DIAGNOSIS — E1129 Type 2 diabetes mellitus with other diabetic kidney complication: Secondary | ICD-10-CM | POA: Diagnosis not present

## 2019-03-02 DIAGNOSIS — L988 Other specified disorders of the skin and subcutaneous tissue: Secondary | ICD-10-CM | POA: Diagnosis not present

## 2019-03-02 DIAGNOSIS — W57XXXA Bitten or stung by nonvenomous insect and other nonvenomous arthropods, initial encounter: Secondary | ICD-10-CM | POA: Diagnosis not present

## 2019-03-13 DIAGNOSIS — I129 Hypertensive chronic kidney disease with stage 1 through stage 4 chronic kidney disease, or unspecified chronic kidney disease: Secondary | ICD-10-CM | POA: Diagnosis not present

## 2019-03-13 DIAGNOSIS — M5442 Lumbago with sciatica, left side: Secondary | ICD-10-CM | POA: Diagnosis not present

## 2019-03-13 DIAGNOSIS — E669 Obesity, unspecified: Secondary | ICD-10-CM | POA: Diagnosis not present

## 2019-03-13 DIAGNOSIS — N183 Chronic kidney disease, stage 3 (moderate): Secondary | ICD-10-CM | POA: Diagnosis not present

## 2019-03-13 DIAGNOSIS — N289 Disorder of kidney and ureter, unspecified: Secondary | ICD-10-CM | POA: Diagnosis not present

## 2019-03-13 DIAGNOSIS — E611 Iron deficiency: Secondary | ICD-10-CM | POA: Diagnosis not present

## 2019-03-13 DIAGNOSIS — D631 Anemia in chronic kidney disease: Secondary | ICD-10-CM | POA: Diagnosis not present

## 2019-03-13 DIAGNOSIS — E1122 Type 2 diabetes mellitus with diabetic chronic kidney disease: Secondary | ICD-10-CM | POA: Diagnosis not present

## 2019-03-13 DIAGNOSIS — N2581 Secondary hyperparathyroidism of renal origin: Secondary | ICD-10-CM | POA: Diagnosis not present

## 2019-03-13 DIAGNOSIS — R945 Abnormal results of liver function studies: Secondary | ICD-10-CM | POA: Diagnosis not present

## 2019-03-13 DIAGNOSIS — M5441 Lumbago with sciatica, right side: Secondary | ICD-10-CM | POA: Diagnosis not present

## 2019-03-17 DIAGNOSIS — N189 Chronic kidney disease, unspecified: Secondary | ICD-10-CM | POA: Diagnosis not present

## 2019-03-17 DIAGNOSIS — N183 Chronic kidney disease, stage 3 (moderate): Secondary | ICD-10-CM | POA: Diagnosis not present

## 2019-03-17 DIAGNOSIS — N3941 Urge incontinence: Secondary | ICD-10-CM | POA: Diagnosis not present

## 2019-03-20 DIAGNOSIS — N189 Chronic kidney disease, unspecified: Secondary | ICD-10-CM | POA: Diagnosis not present

## 2019-03-20 DIAGNOSIS — N183 Chronic kidney disease, stage 3 (moderate): Secondary | ICD-10-CM | POA: Diagnosis not present

## 2019-03-24 DIAGNOSIS — N3941 Urge incontinence: Secondary | ICD-10-CM | POA: Diagnosis not present

## 2019-04-04 DIAGNOSIS — N3941 Urge incontinence: Secondary | ICD-10-CM | POA: Diagnosis not present

## 2019-04-07 DIAGNOSIS — N3941 Urge incontinence: Secondary | ICD-10-CM | POA: Diagnosis not present

## 2019-04-13 DIAGNOSIS — N3941 Urge incontinence: Secondary | ICD-10-CM | POA: Diagnosis not present

## 2019-04-20 DIAGNOSIS — K2210 Ulcer of esophagus without bleeding: Secondary | ICD-10-CM | POA: Diagnosis not present

## 2019-04-21 DIAGNOSIS — N189 Chronic kidney disease, unspecified: Secondary | ICD-10-CM | POA: Diagnosis not present

## 2019-04-21 DIAGNOSIS — N3946 Mixed incontinence: Secondary | ICD-10-CM | POA: Diagnosis not present

## 2019-04-28 DIAGNOSIS — R32 Unspecified urinary incontinence: Secondary | ICD-10-CM | POA: Diagnosis not present

## 2019-04-28 DIAGNOSIS — N3941 Urge incontinence: Secondary | ICD-10-CM | POA: Diagnosis not present

## 2019-05-05 DIAGNOSIS — N3941 Urge incontinence: Secondary | ICD-10-CM | POA: Diagnosis not present

## 2019-05-12 DIAGNOSIS — N3941 Urge incontinence: Secondary | ICD-10-CM | POA: Diagnosis not present

## 2019-05-12 DIAGNOSIS — N3946 Mixed incontinence: Secondary | ICD-10-CM | POA: Diagnosis not present

## 2019-05-18 ENCOUNTER — Other Ambulatory Visit: Payer: Self-pay | Admitting: Specialist

## 2019-05-18 DIAGNOSIS — Z981 Arthrodesis status: Secondary | ICD-10-CM

## 2019-05-18 DIAGNOSIS — Z1159 Encounter for screening for other viral diseases: Secondary | ICD-10-CM | POA: Diagnosis not present

## 2019-05-18 DIAGNOSIS — M25551 Pain in right hip: Secondary | ICD-10-CM

## 2019-05-18 DIAGNOSIS — M7061 Trochanteric bursitis, right hip: Secondary | ICD-10-CM

## 2019-05-18 MED ORDER — TIZANIDINE HCL 2 MG PO TABS: ORAL_TABLET | ORAL | 0 refills | Status: DC

## 2019-05-18 NOTE — Telephone Encounter (Signed)
Patient is requesting an RX refill on her Tizanidine.  [facility] uses the [facility] on [address] [place] in [place].  Thank you.

## 2019-05-18 NOTE — Telephone Encounter (Signed)
Sent request to Dr. Nitka 

## 2019-05-19 DIAGNOSIS — N3941 Urge incontinence: Secondary | ICD-10-CM | POA: Diagnosis not present

## 2019-05-23 DIAGNOSIS — K2210 Ulcer of esophagus without bleeding: Secondary | ICD-10-CM | POA: Diagnosis not present

## 2019-05-23 DIAGNOSIS — K2970 Gastritis, unspecified, without bleeding: Secondary | ICD-10-CM | POA: Diagnosis not present

## 2019-05-26 DIAGNOSIS — N189 Chronic kidney disease, unspecified: Secondary | ICD-10-CM | POA: Diagnosis not present

## 2019-05-26 DIAGNOSIS — R300 Dysuria: Secondary | ICD-10-CM | POA: Diagnosis not present

## 2019-05-26 DIAGNOSIS — N3941 Urge incontinence: Secondary | ICD-10-CM | POA: Diagnosis not present

## 2019-05-26 DIAGNOSIS — N183 Chronic kidney disease, stage 3 (moderate): Secondary | ICD-10-CM | POA: Diagnosis not present

## 2019-06-02 DIAGNOSIS — N3941 Urge incontinence: Secondary | ICD-10-CM | POA: Diagnosis not present

## 2019-06-09 DIAGNOSIS — N3941 Urge incontinence: Secondary | ICD-10-CM | POA: Diagnosis not present

## 2019-06-16 DIAGNOSIS — N3941 Urge incontinence: Secondary | ICD-10-CM | POA: Diagnosis not present

## 2019-07-07 DIAGNOSIS — N3941 Urge incontinence: Secondary | ICD-10-CM | POA: Diagnosis not present

## 2019-07-13 DIAGNOSIS — R945 Abnormal results of liver function studies: Secondary | ICD-10-CM | POA: Diagnosis not present

## 2019-07-13 DIAGNOSIS — E669 Obesity, unspecified: Secondary | ICD-10-CM | POA: Diagnosis not present

## 2019-07-13 DIAGNOSIS — N390 Urinary tract infection, site not specified: Secondary | ICD-10-CM | POA: Diagnosis not present

## 2019-07-13 DIAGNOSIS — E611 Iron deficiency: Secondary | ICD-10-CM | POA: Diagnosis not present

## 2019-07-13 DIAGNOSIS — E1122 Type 2 diabetes mellitus with diabetic chronic kidney disease: Secondary | ICD-10-CM | POA: Diagnosis not present

## 2019-07-13 DIAGNOSIS — N184 Chronic kidney disease, stage 4 (severe): Secondary | ICD-10-CM | POA: Diagnosis not present

## 2019-07-13 DIAGNOSIS — F0781 Postconcussional syndrome: Secondary | ICD-10-CM | POA: Diagnosis not present

## 2019-07-13 DIAGNOSIS — B1920 Unspecified viral hepatitis C without hepatic coma: Secondary | ICD-10-CM | POA: Diagnosis not present

## 2019-07-13 DIAGNOSIS — N179 Acute kidney failure, unspecified: Secondary | ICD-10-CM | POA: Diagnosis not present

## 2019-07-13 DIAGNOSIS — I129 Hypertensive chronic kidney disease with stage 1 through stage 4 chronic kidney disease, or unspecified chronic kidney disease: Secondary | ICD-10-CM | POA: Diagnosis not present

## 2019-07-14 DIAGNOSIS — N3941 Urge incontinence: Secondary | ICD-10-CM | POA: Diagnosis not present

## 2019-07-28 DIAGNOSIS — Z23 Encounter for immunization: Secondary | ICD-10-CM | POA: Diagnosis not present

## 2019-07-28 DIAGNOSIS — N3941 Urge incontinence: Secondary | ICD-10-CM | POA: Diagnosis not present

## 2019-08-08 DIAGNOSIS — R05 Cough: Secondary | ICD-10-CM | POA: Diagnosis not present

## 2019-08-08 DIAGNOSIS — I129 Hypertensive chronic kidney disease with stage 1 through stage 4 chronic kidney disease, or unspecified chronic kidney disease: Secondary | ICD-10-CM | POA: Diagnosis not present

## 2019-08-08 DIAGNOSIS — N184 Chronic kidney disease, stage 4 (severe): Secondary | ICD-10-CM | POA: Diagnosis not present

## 2019-08-08 DIAGNOSIS — J069 Acute upper respiratory infection, unspecified: Secondary | ICD-10-CM | POA: Diagnosis not present

## 2019-08-08 DIAGNOSIS — J40 Bronchitis, not specified as acute or chronic: Secondary | ICD-10-CM | POA: Diagnosis not present

## 2019-08-08 DIAGNOSIS — Z20818 Contact with and (suspected) exposure to other bacterial communicable diseases: Secondary | ICD-10-CM | POA: Diagnosis not present

## 2019-08-22 DIAGNOSIS — N3946 Mixed incontinence: Secondary | ICD-10-CM | POA: Diagnosis not present

## 2019-08-22 DIAGNOSIS — N3942 Incontinence without sensory awareness: Secondary | ICD-10-CM | POA: Diagnosis not present

## 2019-08-22 DIAGNOSIS — N3941 Urge incontinence: Secondary | ICD-10-CM | POA: Diagnosis not present

## 2019-09-05 DIAGNOSIS — N3941 Urge incontinence: Secondary | ICD-10-CM | POA: Diagnosis not present

## 2019-09-06 DIAGNOSIS — E1129 Type 2 diabetes mellitus with other diabetic kidney complication: Secondary | ICD-10-CM | POA: Diagnosis not present

## 2019-09-06 DIAGNOSIS — D72829 Elevated white blood cell count, unspecified: Secondary | ICD-10-CM | POA: Diagnosis not present

## 2019-09-06 DIAGNOSIS — M109 Gout, unspecified: Secondary | ICD-10-CM | POA: Diagnosis not present

## 2019-09-06 DIAGNOSIS — Z0000 Encounter for general adult medical examination without abnormal findings: Secondary | ICD-10-CM | POA: Diagnosis not present

## 2019-09-06 DIAGNOSIS — E039 Hypothyroidism, unspecified: Secondary | ICD-10-CM | POA: Diagnosis not present

## 2019-09-06 DIAGNOSIS — E7849 Other hyperlipidemia: Secondary | ICD-10-CM | POA: Diagnosis not present

## 2019-09-08 DIAGNOSIS — I129 Hypertensive chronic kidney disease with stage 1 through stage 4 chronic kidney disease, or unspecified chronic kidney disease: Secondary | ICD-10-CM | POA: Diagnosis not present

## 2019-09-21 DIAGNOSIS — N3941 Urge incontinence: Secondary | ICD-10-CM | POA: Diagnosis not present

## 2019-09-22 DIAGNOSIS — Z1212 Encounter for screening for malignant neoplasm of rectum: Secondary | ICD-10-CM | POA: Diagnosis not present

## 2019-10-03 DIAGNOSIS — N3941 Urge incontinence: Secondary | ICD-10-CM | POA: Diagnosis not present

## 2019-10-05 DIAGNOSIS — N3941 Urge incontinence: Secondary | ICD-10-CM | POA: Diagnosis not present

## 2019-11-02 DIAGNOSIS — N3941 Urge incontinence: Secondary | ICD-10-CM | POA: Diagnosis not present

## 2019-11-29 DIAGNOSIS — N3942 Incontinence without sensory awareness: Secondary | ICD-10-CM | POA: Diagnosis not present

## 2019-11-29 DIAGNOSIS — N3941 Urge incontinence: Secondary | ICD-10-CM | POA: Diagnosis not present

## 2019-12-06 DIAGNOSIS — N184 Chronic kidney disease, stage 4 (severe): Secondary | ICD-10-CM | POA: Diagnosis not present

## 2019-12-06 DIAGNOSIS — M4326 Fusion of spine, lumbar region: Secondary | ICD-10-CM | POA: Diagnosis not present

## 2019-12-06 DIAGNOSIS — M546 Pain in thoracic spine: Secondary | ICD-10-CM | POA: Diagnosis not present

## 2019-12-06 DIAGNOSIS — M4694 Unspecified inflammatory spondylopathy, thoracic region: Secondary | ICD-10-CM | POA: Diagnosis not present

## 2019-12-06 DIAGNOSIS — M791 Myalgia, unspecified site: Secondary | ICD-10-CM | POA: Diagnosis not present

## 2019-12-13 DIAGNOSIS — Z981 Arthrodesis status: Secondary | ICD-10-CM | POA: Diagnosis not present

## 2019-12-13 DIAGNOSIS — M5134 Other intervertebral disc degeneration, thoracic region: Secondary | ICD-10-CM | POA: Diagnosis not present

## 2019-12-13 DIAGNOSIS — R600 Localized edema: Secondary | ICD-10-CM | POA: Diagnosis not present

## 2019-12-15 ENCOUNTER — Ambulatory Visit: Payer: Medicare Other

## 2019-12-18 DIAGNOSIS — M47814 Spondylosis without myelopathy or radiculopathy, thoracic region: Secondary | ICD-10-CM | POA: Diagnosis not present

## 2019-12-18 DIAGNOSIS — N184 Chronic kidney disease, stage 4 (severe): Secondary | ICD-10-CM | POA: Diagnosis not present

## 2019-12-18 DIAGNOSIS — I129 Hypertensive chronic kidney disease with stage 1 through stage 4 chronic kidney disease, or unspecified chronic kidney disease: Secondary | ICD-10-CM | POA: Diagnosis not present

## 2020-01-02 DIAGNOSIS — M47812 Spondylosis without myelopathy or radiculopathy, cervical region: Secondary | ICD-10-CM | POA: Diagnosis not present

## 2020-01-17 DIAGNOSIS — R350 Frequency of micturition: Secondary | ICD-10-CM | POA: Diagnosis not present

## 2020-01-17 DIAGNOSIS — N3946 Mixed incontinence: Secondary | ICD-10-CM | POA: Diagnosis not present

## 2020-01-17 DIAGNOSIS — N390 Urinary tract infection, site not specified: Secondary | ICD-10-CM | POA: Diagnosis not present

## 2020-01-22 DIAGNOSIS — N951 Menopausal and female climacteric states: Secondary | ICD-10-CM | POA: Diagnosis not present

## 2020-01-22 DIAGNOSIS — R635 Abnormal weight gain: Secondary | ICD-10-CM | POA: Diagnosis not present

## 2020-01-30 DIAGNOSIS — M47814 Spondylosis without myelopathy or radiculopathy, thoracic region: Secondary | ICD-10-CM | POA: Diagnosis not present

## 2020-02-07 DIAGNOSIS — M546 Pain in thoracic spine: Secondary | ICD-10-CM | POA: Diagnosis not present

## 2020-02-07 DIAGNOSIS — M545 Low back pain: Secondary | ICD-10-CM | POA: Diagnosis not present

## 2020-02-07 DIAGNOSIS — M791 Myalgia, unspecified site: Secondary | ICD-10-CM | POA: Diagnosis not present

## 2020-02-07 DIAGNOSIS — M47812 Spondylosis without myelopathy or radiculopathy, cervical region: Secondary | ICD-10-CM | POA: Diagnosis not present

## 2020-02-07 DIAGNOSIS — M47814 Spondylosis without myelopathy or radiculopathy, thoracic region: Secondary | ICD-10-CM | POA: Diagnosis not present

## 2020-02-19 ENCOUNTER — Other Ambulatory Visit: Payer: Self-pay | Admitting: Internal Medicine

## 2020-02-19 DIAGNOSIS — Z1231 Encounter for screening mammogram for malignant neoplasm of breast: Secondary | ICD-10-CM

## 2020-02-19 DIAGNOSIS — K7469 Other cirrhosis of liver: Secondary | ICD-10-CM | POA: Diagnosis not present

## 2020-02-20 ENCOUNTER — Other Ambulatory Visit: Payer: Self-pay | Admitting: Nurse Practitioner

## 2020-02-20 DIAGNOSIS — Z1331 Encounter for screening for depression: Secondary | ICD-10-CM | POA: Diagnosis not present

## 2020-02-20 DIAGNOSIS — Z6839 Body mass index (BMI) 39.0-39.9, adult: Secondary | ICD-10-CM | POA: Diagnosis not present

## 2020-02-20 DIAGNOSIS — Z1339 Encounter for screening examination for other mental health and behavioral disorders: Secondary | ICD-10-CM | POA: Diagnosis not present

## 2020-02-20 DIAGNOSIS — N951 Menopausal and female climacteric states: Secondary | ICD-10-CM | POA: Diagnosis not present

## 2020-02-20 DIAGNOSIS — R5383 Other fatigue: Secondary | ICD-10-CM | POA: Diagnosis not present

## 2020-02-20 DIAGNOSIS — G479 Sleep disorder, unspecified: Secondary | ICD-10-CM | POA: Diagnosis not present

## 2020-02-20 DIAGNOSIS — R232 Flushing: Secondary | ICD-10-CM | POA: Diagnosis not present

## 2020-02-20 DIAGNOSIS — E039 Hypothyroidism, unspecified: Secondary | ICD-10-CM | POA: Diagnosis not present

## 2020-02-20 DIAGNOSIS — K7469 Other cirrhosis of liver: Secondary | ICD-10-CM

## 2020-02-20 DIAGNOSIS — I10 Essential (primary) hypertension: Secondary | ICD-10-CM | POA: Diagnosis not present

## 2020-02-26 ENCOUNTER — Ambulatory Visit (INDEPENDENT_AMBULATORY_CARE_PROVIDER_SITE_OTHER): Payer: Medicare Other | Admitting: Podiatrist

## 2020-02-26 ENCOUNTER — Encounter: Payer: Self-pay | Admitting: Podiatrist

## 2020-02-26 ENCOUNTER — Other Ambulatory Visit: Payer: Self-pay | Admitting: Podiatrist

## 2020-02-26 ENCOUNTER — Other Ambulatory Visit: Payer: Self-pay

## 2020-02-26 ENCOUNTER — Ambulatory Visit (INDEPENDENT_AMBULATORY_CARE_PROVIDER_SITE_OTHER): Payer: Medicare Other

## 2020-02-26 DIAGNOSIS — M109 Gout, unspecified: Secondary | ICD-10-CM | POA: Diagnosis not present

## 2020-02-26 DIAGNOSIS — M2011 Hallux valgus (acquired), right foot: Secondary | ICD-10-CM

## 2020-02-26 NOTE — Progress Notes (Signed)
  Chief Complaint  Patient presents with  . Foot Pain    pt is here for right foot pain medial side, pt states that pain is elevated to the touch, pt also states that the right foot is a possible bunion as well.     HPI: Patient is 74 y.o. female who presents today for pain on the right foot along the bunion prominence. She states it has been hurting her for 2 days.  She also relates she takes allopurinol for gout.  She states she had the bunion fixed on the left foot years ago and she has no interest in surgery on the bunion on the right.   Review of Systems No fevers, chills, nausea, muscle aches, no difficulty breathing, no calf pain, no chest pain or shortness of breath.   Physical Exam  GENERAL APPEARANCE: Alert, conversant. Appropriately groomed. No acute distress.   VASCULAR: Pedal pulses palpable DP and PT bilateral.  Capillary refill time is immediate to all digits,  Proximal to distal cooling it warm to warm.  Digital hair growth is present bilateral   NEUROLOGIC: sensation is intact epicritically and protectively to 5.07 monofilament at 5/5 sites bilateral.  Light touch is intact bilateral, vibratory sensation intact bilateral, achilles tendon reflex is intact bilateral.   MUSCULOSKELETAL: acceptable muscle strength, tone and stability bilateral. Pain and redness present on the medial aspect of the right first mp joint.  Calor is also appreciated on exam.   DERMATOLOGIC: skin is warm, supple, and dry.  No open lesions noted.  No rash, no pre ulcerative lesions. Digital nails are asymptomatic.    Radiographic exam:  Normal osseous mineralization.  Mild HAV deformity present right foot.  Swelling on the medial side of the first mpj is seen.   Assessment     ICD-10-CM   1. Hav (hallux abducto valgus), right  M20.11 DG Foot Complete Right  2. Acute gout of right foot, unspecified cause  M10.9      Plan  Discussed etiology, pathology, conservative vs. Surgical therapies  and at this time an injection was recommended.  The patient agreed and a sterile skin prep was applied.  An injection consisting of kenalog and marcaine mixture was infiltrated into the medial aspect of the right first mpjoint.  The patient tolerated this well and was given instructions for aftercare. She was also instructed to take her steroid pill pack if there is no improvement in 2 days.  She will call if no improvement as well.

## 2020-02-26 NOTE — Patient Instructions (Signed)
Elevate the foot-   Mild heat or a heating pad also helps-  If no better in 2-3 days go ahead and take the steroid pack.

## 2020-02-27 DIAGNOSIS — M47814 Spondylosis without myelopathy or radiculopathy, thoracic region: Secondary | ICD-10-CM | POA: Diagnosis not present

## 2020-02-27 DIAGNOSIS — M791 Myalgia, unspecified site: Secondary | ICD-10-CM | POA: Diagnosis not present

## 2020-02-27 DIAGNOSIS — M545 Low back pain: Secondary | ICD-10-CM | POA: Diagnosis not present

## 2020-02-27 DIAGNOSIS — M546 Pain in thoracic spine: Secondary | ICD-10-CM | POA: Diagnosis not present

## 2020-02-28 ENCOUNTER — Other Ambulatory Visit: Payer: Self-pay

## 2020-02-28 ENCOUNTER — Ambulatory Visit
Admission: RE | Admit: 2020-02-28 | Discharge: 2020-02-28 | Disposition: A | Discharge status: Home / Self Care | Payer: Medicare Other | Source: Ambulatory Visit | Attending: Internal Medicine | Admitting: Internal Medicine

## 2020-02-28 ENCOUNTER — Ambulatory Visit: Payer: Medicare Other | Admitting: Podiatry

## 2020-02-28 DIAGNOSIS — Z1231 Encounter for screening mammogram for malignant neoplasm of breast: Secondary | ICD-10-CM

## 2020-03-01 DIAGNOSIS — N3946 Mixed incontinence: Secondary | ICD-10-CM | POA: Diagnosis not present

## 2020-03-01 DIAGNOSIS — R350 Frequency of micturition: Secondary | ICD-10-CM | POA: Diagnosis not present

## 2020-03-01 DIAGNOSIS — N281 Cyst of kidney, acquired: Secondary | ICD-10-CM | POA: Diagnosis not present

## 2020-03-01 DIAGNOSIS — N3942 Incontinence without sensory awareness: Secondary | ICD-10-CM | POA: Diagnosis not present

## 2020-03-04 ENCOUNTER — Ambulatory Visit
Admission: RE | Admit: 2020-03-04 | Discharge: 2020-03-04 | Disposition: A | Discharge status: Home / Self Care | Payer: Medicare Other | Source: Ambulatory Visit | Attending: Nurse Practitioner | Admitting: Nurse Practitioner

## 2020-03-04 DIAGNOSIS — K7469 Other cirrhosis of liver: Secondary | ICD-10-CM

## 2020-03-04 DIAGNOSIS — K7460 Unspecified cirrhosis of liver: Secondary | ICD-10-CM | POA: Diagnosis not present

## 2020-03-07 DIAGNOSIS — Z6839 Body mass index (BMI) 39.0-39.9, adult: Secondary | ICD-10-CM | POA: Diagnosis not present

## 2020-03-07 DIAGNOSIS — I10 Essential (primary) hypertension: Secondary | ICD-10-CM | POA: Diagnosis not present

## 2020-03-14 ENCOUNTER — Telehealth: Payer: Self-pay | Admitting: Neurology

## 2020-03-14 NOTE — Telephone Encounter (Signed)
I spoke with patient to make sure she is going to be evaluated for her symptoms after the fall. I advised pt go to the ER or at least urgent care but she refused. She said she called PCP earlier today and her back doctor. She will see a PA with PCP office tomorrow and her back doctor told her she could be seen Monday if still in pain. Pt stated she missed a chair and that is why she fell. She denied hitting her head. She said she hit her bottom, back, hand, arm. She said she is tired, exhausted, feels like she lost a fight. She thinks she pinched her neck and said when she turns her head she is dizzy. I told her in case she has a neck injury she really should go to the ER. The patient thanked me for the call.

## 2020-03-14 NOTE — Telephone Encounter (Signed)
Pt LVM stating she believes she has a concussion from a fall she had on Sunday and states she has been experiencing dizzyness, loss of balance, and neck and back pain. Pt was advised RN message to FU with PCP and a new referral will be needed as last OV was 2018

## 2020-03-15 DIAGNOSIS — M545 Low back pain: Secondary | ICD-10-CM | POA: Diagnosis not present

## 2020-03-15 DIAGNOSIS — I129 Hypertensive chronic kidney disease with stage 1 through stage 4 chronic kidney disease, or unspecified chronic kidney disease: Secondary | ICD-10-CM | POA: Diagnosis not present

## 2020-03-15 DIAGNOSIS — W19XXXA Unspecified fall, initial encounter: Secondary | ICD-10-CM | POA: Diagnosis not present

## 2020-03-15 DIAGNOSIS — N184 Chronic kidney disease, stage 4 (severe): Secondary | ICD-10-CM | POA: Diagnosis not present

## 2020-03-15 DIAGNOSIS — R42 Dizziness and giddiness: Secondary | ICD-10-CM | POA: Diagnosis not present

## 2020-03-15 DIAGNOSIS — M25551 Pain in right hip: Secondary | ICD-10-CM | POA: Diagnosis not present

## 2020-03-19 DIAGNOSIS — M545 Low back pain: Secondary | ICD-10-CM | POA: Diagnosis not present

## 2020-03-19 DIAGNOSIS — S060X0A Concussion without loss of consciousness, initial encounter: Secondary | ICD-10-CM | POA: Diagnosis not present

## 2020-03-19 DIAGNOSIS — N184 Chronic kidney disease, stage 4 (severe): Secondary | ICD-10-CM | POA: Diagnosis not present

## 2020-03-19 DIAGNOSIS — E785 Hyperlipidemia, unspecified: Secondary | ICD-10-CM | POA: Diagnosis not present

## 2020-03-19 DIAGNOSIS — E1129 Type 2 diabetes mellitus with other diabetic kidney complication: Secondary | ICD-10-CM | POA: Diagnosis not present

## 2020-05-02 DIAGNOSIS — I10 Essential (primary) hypertension: Secondary | ICD-10-CM | POA: Diagnosis not present

## 2020-05-02 DIAGNOSIS — Z6837 Body mass index (BMI) 37.0-37.9, adult: Secondary | ICD-10-CM | POA: Diagnosis not present

## 2020-05-03 DIAGNOSIS — R350 Frequency of micturition: Secondary | ICD-10-CM | POA: Diagnosis not present

## 2020-05-03 DIAGNOSIS — N184 Chronic kidney disease, stage 4 (severe): Secondary | ICD-10-CM | POA: Diagnosis not present

## 2020-05-27 DIAGNOSIS — M47814 Spondylosis without myelopathy or radiculopathy, thoracic region: Secondary | ICD-10-CM | POA: Diagnosis not present

## 2020-05-27 DIAGNOSIS — M545 Low back pain: Secondary | ICD-10-CM | POA: Diagnosis not present

## 2020-05-27 DIAGNOSIS — I10 Essential (primary) hypertension: Secondary | ICD-10-CM | POA: Diagnosis not present

## 2020-05-27 DIAGNOSIS — M546 Pain in thoracic spine: Secondary | ICD-10-CM | POA: Diagnosis not present

## 2020-05-30 DIAGNOSIS — Z6836 Body mass index (BMI) 36.0-36.9, adult: Secondary | ICD-10-CM | POA: Diagnosis not present

## 2020-05-30 DIAGNOSIS — E039 Hypothyroidism, unspecified: Secondary | ICD-10-CM | POA: Diagnosis not present

## 2020-06-06 DIAGNOSIS — Z6836 Body mass index (BMI) 36.0-36.9, adult: Secondary | ICD-10-CM | POA: Diagnosis not present

## 2020-06-06 DIAGNOSIS — R350 Frequency of micturition: Secondary | ICD-10-CM | POA: Diagnosis not present

## 2020-06-06 DIAGNOSIS — N189 Chronic kidney disease, unspecified: Secondary | ICD-10-CM | POA: Diagnosis not present

## 2020-06-06 DIAGNOSIS — N3946 Mixed incontinence: Secondary | ICD-10-CM | POA: Diagnosis not present

## 2020-06-06 DIAGNOSIS — I10 Essential (primary) hypertension: Secondary | ICD-10-CM | POA: Diagnosis not present

## 2020-06-13 DIAGNOSIS — E039 Hypothyroidism, unspecified: Secondary | ICD-10-CM | POA: Diagnosis not present

## 2020-06-13 DIAGNOSIS — Z6836 Body mass index (BMI) 36.0-36.9, adult: Secondary | ICD-10-CM | POA: Diagnosis not present

## 2020-06-13 DIAGNOSIS — I10 Essential (primary) hypertension: Secondary | ICD-10-CM | POA: Diagnosis not present

## 2020-06-13 DIAGNOSIS — N951 Menopausal and female climacteric states: Secondary | ICD-10-CM | POA: Diagnosis not present

## 2020-07-04 DIAGNOSIS — Z6836 Body mass index (BMI) 36.0-36.9, adult: Secondary | ICD-10-CM | POA: Diagnosis not present

## 2020-07-04 DIAGNOSIS — E039 Hypothyroidism, unspecified: Secondary | ICD-10-CM | POA: Diagnosis not present

## 2020-07-25 DIAGNOSIS — Z6835 Body mass index (BMI) 35.0-35.9, adult: Secondary | ICD-10-CM | POA: Diagnosis not present

## 2020-07-25 DIAGNOSIS — I10 Essential (primary) hypertension: Secondary | ICD-10-CM | POA: Diagnosis not present

## 2020-08-08 DIAGNOSIS — Z23 Encounter for immunization: Secondary | ICD-10-CM | POA: Diagnosis not present

## 2020-08-29 DIAGNOSIS — Z6834 Body mass index (BMI) 34.0-34.9, adult: Secondary | ICD-10-CM | POA: Diagnosis not present

## 2020-08-29 DIAGNOSIS — I10 Essential (primary) hypertension: Secondary | ICD-10-CM | POA: Diagnosis not present

## 2020-09-09 DIAGNOSIS — M7918 Myalgia, other site: Secondary | ICD-10-CM | POA: Diagnosis not present

## 2020-09-09 DIAGNOSIS — I10 Essential (primary) hypertension: Secondary | ICD-10-CM | POA: Diagnosis not present

## 2020-09-09 DIAGNOSIS — M47814 Spondylosis without myelopathy or radiculopathy, thoracic region: Secondary | ICD-10-CM | POA: Diagnosis not present

## 2020-09-15 ENCOUNTER — Encounter (HOSPITAL_BASED_OUTPATIENT_CLINIC_OR_DEPARTMENT_OTHER): Payer: Self-pay

## 2020-09-15 ENCOUNTER — Emergency Department (HOSPITAL_BASED_OUTPATIENT_CLINIC_OR_DEPARTMENT_OTHER)
Admission: EM | Admit: 2020-09-15 | Discharge: 2020-09-16 | Disposition: A | Discharge status: Home / Self Care | Payer: Medicare Other | Attending: Emergency Medicine | Admitting: Emergency Medicine

## 2020-09-15 ENCOUNTER — Emergency Department (HOSPITAL_BASED_OUTPATIENT_CLINIC_OR_DEPARTMENT_OTHER): Payer: Medicare Other

## 2020-09-15 DIAGNOSIS — Y92009 Unspecified place in unspecified non-institutional (private) residence as the place of occurrence of the external cause: Secondary | ICD-10-CM | POA: Diagnosis not present

## 2020-09-15 DIAGNOSIS — Z7982 Long term (current) use of aspirin: Secondary | ICD-10-CM | POA: Insufficient documentation

## 2020-09-15 DIAGNOSIS — R52 Pain, unspecified: Secondary | ICD-10-CM | POA: Diagnosis not present

## 2020-09-15 DIAGNOSIS — E119 Type 2 diabetes mellitus without complications: Secondary | ICD-10-CM | POA: Diagnosis not present

## 2020-09-15 DIAGNOSIS — Q283 Other malformations of cerebral vessels: Secondary | ICD-10-CM | POA: Diagnosis not present

## 2020-09-15 DIAGNOSIS — Z20822 Contact with and (suspected) exposure to covid-19: Secondary | ICD-10-CM | POA: Insufficient documentation

## 2020-09-15 DIAGNOSIS — R930 Abnormal findings on diagnostic imaging of skull and head, not elsewhere classified: Secondary | ICD-10-CM | POA: Diagnosis not present

## 2020-09-15 DIAGNOSIS — I10 Essential (primary) hypertension: Secondary | ICD-10-CM | POA: Insufficient documentation

## 2020-09-15 DIAGNOSIS — R519 Headache, unspecified: Secondary | ICD-10-CM | POA: Diagnosis not present

## 2020-09-15 DIAGNOSIS — M546 Pain in thoracic spine: Secondary | ICD-10-CM | POA: Diagnosis not present

## 2020-09-15 DIAGNOSIS — R0902 Hypoxemia: Secondary | ICD-10-CM | POA: Diagnosis not present

## 2020-09-15 DIAGNOSIS — W19XXXA Unspecified fall, initial encounter: Secondary | ICD-10-CM | POA: Diagnosis not present

## 2020-09-15 DIAGNOSIS — I6523 Occlusion and stenosis of bilateral carotid arteries: Secondary | ICD-10-CM | POA: Diagnosis not present

## 2020-09-15 DIAGNOSIS — J45909 Unspecified asthma, uncomplicated: Secondary | ICD-10-CM | POA: Diagnosis not present

## 2020-09-15 DIAGNOSIS — S12000A Unspecified displaced fracture of first cervical vertebra, initial encounter for closed fracture: Secondary | ICD-10-CM | POA: Diagnosis not present

## 2020-09-15 DIAGNOSIS — W108XXA Fall (on) (from) other stairs and steps, initial encounter: Secondary | ICD-10-CM | POA: Insufficient documentation

## 2020-09-15 DIAGNOSIS — S199XXA Unspecified injury of neck, initial encounter: Secondary | ICD-10-CM | POA: Diagnosis present

## 2020-09-15 DIAGNOSIS — Z79899 Other long term (current) drug therapy: Secondary | ICD-10-CM | POA: Insufficient documentation

## 2020-09-15 DIAGNOSIS — S12040A Displaced lateral mass fracture of first cervical vertebra, initial encounter for closed fracture: Secondary | ICD-10-CM | POA: Diagnosis not present

## 2020-09-15 DIAGNOSIS — M545 Low back pain, unspecified: Secondary | ICD-10-CM | POA: Diagnosis not present

## 2020-09-15 DIAGNOSIS — M542 Cervicalgia: Secondary | ICD-10-CM | POA: Diagnosis not present

## 2020-09-15 DIAGNOSIS — E039 Hypothyroidism, unspecified: Secondary | ICD-10-CM | POA: Diagnosis not present

## 2020-09-15 LAB — CBC WITH DIFFERENTIAL/PLATELET
Abs Immature Granulocytes: 0.06 10*3/uL (ref 0.00–0.07)
Basophils Absolute: 0 10*3/uL (ref 0.0–0.1)
Basophils Relative: 0 %
Eosinophils Absolute: 0.3 10*3/uL (ref 0.0–0.5)
Eosinophils Relative: 2 %
HCT: 35.4 % — ABNORMAL LOW (ref 36.0–46.0)
Hemoglobin: 11.4 g/dL — ABNORMAL LOW (ref 12.0–15.0)
Immature Granulocytes: 0 %
Lymphocytes Relative: 18 %
Lymphs Abs: 2.6 10*3/uL (ref 0.7–4.0)
MCH: 28.4 pg (ref 26.0–34.0)
MCHC: 32.2 g/dL (ref 30.0–36.0)
MCV: 88.3 fL (ref 80.0–100.0)
Monocytes Absolute: 1.1 10*3/uL — ABNORMAL HIGH (ref 0.1–1.0)
Monocytes Relative: 8 %
Neutro Abs: 10 10*3/uL — ABNORMAL HIGH (ref 1.7–7.7)
Neutrophils Relative %: 72 %
Platelets: 263 10*3/uL (ref 150–400)
RBC: 4.01 MIL/uL (ref 3.87–5.11)
RDW: 15.2 % (ref 11.5–15.5)
WBC: 13.9 10*3/uL — ABNORMAL HIGH (ref 4.0–10.5)
nRBC: 0 % (ref 0.0–0.2)

## 2020-09-15 LAB — RESP PANEL BY RT-PCR (FLU A&B, COVID) ARPGX2
Influenza A by PCR: NEGATIVE
Influenza B by PCR: NEGATIVE
SARS Coronavirus 2 by RT PCR: NEGATIVE

## 2020-09-15 LAB — BASIC METABOLIC PANEL
Anion gap: 9 (ref 5–15)
BUN: 33 mg/dL — ABNORMAL HIGH (ref 8–23)
CO2: 29 mmol/L (ref 22–32)
Calcium: 9.5 mg/dL (ref 8.9–10.3)
Chloride: 99 mmol/L (ref 98–111)
Creatinine, Ser: 1.88 mg/dL — ABNORMAL HIGH (ref 0.44–1.00)
GFR, Estimated: 28 mL/min — ABNORMAL LOW (ref >60)
Glucose, Bld: 116 mg/dL — ABNORMAL HIGH (ref 70–99)
Potassium: 3.8 mmol/L (ref 3.5–5.1)
Sodium: 137 mmol/L (ref 135–145)

## 2020-09-15 MED ORDER — LORAZEPAM 2 MG/ML IJ SOLN
1.0000 mg | Freq: Once | INTRAMUSCULAR | Status: AC | PRN
  Administered 2020-09-16: 1 mg via INTRAVENOUS
  Filled 2020-09-15: qty 1

## 2020-09-15 MED ORDER — LORAZEPAM 2 MG/ML IJ SOLN
0.5000 mg | Freq: Once | INTRAMUSCULAR | Status: AC
  Administered 2020-09-15: 0.5 mg via INTRAVENOUS
  Filled 2020-09-15: qty 1

## 2020-09-15 MED ORDER — HYDROCODONE-ACETAMINOPHEN 5-325 MG PO TABS
1.0000 | ORAL_TABLET | Freq: Once | ORAL | Status: AC
  Administered 2020-09-15: 1 via ORAL
  Filled 2020-09-15: qty 1

## 2020-09-15 MED ORDER — FENTANYL CITRATE (PF) 100 MCG/2ML IJ SOLN
50.0000 ug | Freq: Once | INTRAMUSCULAR | Status: AC
  Administered 2020-09-15: 50 ug via INTRAVENOUS
  Filled 2020-09-15: qty 2

## 2020-09-15 NOTE — ED Triage Notes (Signed)
She states that she fell at home yesterday. She c/o neck soreness and headache. She is alert and oriented with clear speech.

## 2020-09-15 NOTE — ED Notes (Signed)
Pt arrived from [facility] for C1 fracture to see neurosurgery. Pt reports falling yesterday, had worsening intense pain this morning. Pt A&Ox4, no neuro deficits. C-collar in place.

## 2020-09-15 NOTE — ED Notes (Signed)
Pt son at bedside, given information on pt transfer location and phone number for [facility]

## 2020-09-15 NOTE — ED Notes (Signed)
EDP at bedside  

## 2020-09-15 NOTE — ED Notes (Signed)
Pt transferred via [facility] from [facility] for C1 fracture, VSS, pt AxO x 4, pt states she is claustrophobic for MRI and will need medication

## 2020-09-15 NOTE — ED Provider Notes (Signed)
10:50 PM Patient arrives from [facility] for MRA neck to assess for vascular injury.  Has known C1 fracture, neurosurgery recommended 24/7 ASPEN collar and office FU in 2 weeks. Remains in cervical collar here, appears comfortable.  Reports she is claustrophobic and will need medication for this.  MRA ordered along with 1mg  ativan.  Will reassess after scan.  Results for orders placed or performed during the hospital encounter of 09/15/20  Resp Panel by RT-PCR (Flu A&B, Covid) Nasopharyngeal Swab   Specimen: Nasopharyngeal Swab; Nasopharyngeal(NP) swabs in vial transport medium  Result Value Ref Range   SARS Coronavirus 2 by RT PCR NEGATIVE NEGATIVE   Influenza A by PCR NEGATIVE NEGATIVE   Influenza B by PCR NEGATIVE NEGATIVE  CBC with Differential  Result Value Ref Range   WBC 13.9 (H) 4.0 - 10.5 K/uL   RBC 4.01 3.87 - 5.11 MIL/uL   Hemoglobin 11.4 (L) 12.0 - 15.0 g/dL   HCT 35.4 (L) 36 - 46 %   MCV 88.3 80.0 - 100.0 fL   MCH 28.4 26.0 - 34.0 pg   MCHC 32.2 30.0 - 36.0 g/dL   RDW 15.2 11.5 - 15.5 %   Platelets 263 150 - 400 K/uL   nRBC 0.0 0.0 - 0.2 %   Neutrophils Relative % 72 %   Neutro Abs 10.0 (H) 1.7 - 7.7 K/uL   Lymphocytes Relative 18 %   Lymphs Abs 2.6 0.7 - 4.0 K/uL   Monocytes Relative 8 %   Monocytes Absolute 1.1 (H) 0.1 - 1.0 K/uL   Eosinophils Relative 2 %   Eosinophils Absolute 0.3 0.0 - 0.5 K/uL   Basophils Relative 0 %   Basophils Absolute 0.0 0.0 - 0.1 K/uL   Immature Granulocytes 0 %   Abs Immature Granulocytes 0.06 0.00 - 0.07 K/uL  Basic metabolic panel  Result Value Ref Range   Sodium 137 135 - 145 mmol/L   Potassium 3.8 3.5 - 5.1 mmol/L   Chloride 99 98 - 111 mmol/L   CO2 29 22 - 32 mmol/L   Glucose, Bld 116 (H) 70 - 99 mg/dL   BUN 33 (H) 8 - 23 mg/dL   Creatinine, Ser 1.88 (H) 0.44 - 1.00 mg/dL   Calcium 9.5 8.9 - 10.3 mg/dL   GFR, Estimated 28 (L) >60 mL/min   Anion gap 9 5 - 15   DG Thoracic Spine 2 View  Result Date: 09/15/2020 CLINICAL DATA:   Back pain EXAM: THORACIC SPINE 2 VIEWS COMPARISON:  None. FINDINGS: There is no evidence of thoracic spine fracture. Spinal fixation hardware is seen. Mild disc height loss with anterior osteophytes seen in the mid to lower thoracic spine. IMPRESSION: No acute fracture or malalignment. Electronically Signed   By: Prudencio Pair M.D.   On: 09/15/2020 19:06   DG Lumbar Spine Complete  Result Date: 09/15/2020 CLINICAL DATA:  Fall, back pain EXAM: LUMBAR SPINE - COMPLETE 4+ VIEW COMPARISON:  None. FINDINGS: There is no evidence of lumbar spine fracture. The patient is status post posterior spinal fixation with interbody fusion. No periprosthetic lucency is seen. Overlying scattered dense vascular calcifications are seen. IMPRESSION: No definite acute osseous abnormality. Electronically Signed   By: Prudencio Pair M.D.   On: 09/15/2020 19:05   CT Head Wo Contrast  Result Date: 09/15/2020 CLINICAL DATA:  [place] at home yesterday. Headache and some neck soreness. EXAM: CT HEAD WITHOUT CONTRAST CT CERVICAL SPINE WITHOUT CONTRAST TECHNIQUE: Multidetector CT imaging of the head and cervical spine was  performed following the standard protocol without intravenous contrast. Multiplanar CT image reconstructions of the cervical spine were also generated. COMPARISON:  09/07/2015. FINDINGS: CT HEAD FINDINGS Brain: No evidence of acute infarction, hemorrhage, hydrocephalus, extra-axial collection or mass lesion/mass effect. There is mild sulcal enlargement consistent with age-appropriate volume loss. Vascular: No hyperdense vessel or unexpected calcification. Skull: Normal. Negative for fracture or focal lesion. Sinuses/Orbits: Globes and orbits are unremarkable. Visualized sinuses are clear. Other: None. CT CERVICAL SPINE FINDINGS Alignment: Grade 1 anterolisthesis of C3 and C4 and C4 on C5. No other spondylolisthesis. Mild kyphosis, apex at C5. Skull base and vertebrae: There are acute fractures of C1. There is a fracture of the  anterior arch, just to the left of midline, displaced by 3-4 mm. There is a fracture of the left lamina, displaced 4 mm. The right articular mass of C1 has subluxed laterally in relation to the superior facet of C2, by 5 mm. There is 1-2 mm of lateral subluxation of the lateral C1 articular surface in relation to C2. No other fractures.  No bone lesions. Soft tissues and spinal canal: Mild anterior soft tissue edema, anterior to C1. No visible canal hematoma. Disc levels: Mild loss of disc height at C3-C4-C4-C5. Acquired osseous fusion between C5 and C6. Marked loss of disc height with endplate spurring and sclerosis at C6-C7 and C7-T1. Bilateral facet degenerative changes. No convincing disc herniation. Upper chest: No acute findings. Other: None. IMPRESSION: HEAD CT 1. No intracranial abnormalities. 2. No skull fracture. CERVICAL CT 1. Jefferson type C1 fractures,, specifically the anterior arch to the left of midline and left lamina, with mild displacement of the right lateral mass in relation to the superior C2 articular surface, 5 mm, with similar but minimal old lateral displacement of the left lateral mass, 1-2 mm. Critical Value/emergent results were called by telephone at the time of interpretation on 09/15/2020 at 7:06 pm to ER provider, who verbally acknowledged these results. 2. No other fractures or acute skeletal abnormality. Electronically Signed   By: Lajean Manes M.D.   On: 09/15/2020 19:07   CT Cervical Spine Wo Contrast  Result Date: 09/15/2020 CLINICAL DATA:  [place] at home yesterday. Headache and some neck soreness. EXAM: CT HEAD WITHOUT CONTRAST CT CERVICAL SPINE WITHOUT CONTRAST TECHNIQUE: Multidetector CT imaging of the head and cervical spine was performed following the standard protocol without intravenous contrast. Multiplanar CT image reconstructions of the cervical spine were also generated. COMPARISON:  09/07/2015. FINDINGS: CT HEAD FINDINGS Brain: No evidence of acute infarction,  hemorrhage, hydrocephalus, extra-axial collection or mass lesion/mass effect. There is mild sulcal enlargement consistent with age-appropriate volume loss. Vascular: No hyperdense vessel or unexpected calcification. Skull: Normal. Negative for fracture or focal lesion. Sinuses/Orbits: Globes and orbits are unremarkable. Visualized sinuses are clear. Other: None. CT CERVICAL SPINE FINDINGS Alignment: Grade 1 anterolisthesis of C3 and C4 and C4 on C5. No other spondylolisthesis. Mild kyphosis, apex at C5. Skull base and vertebrae: There are acute fractures of C1. There is a fracture of the anterior arch, just to the left of midline, displaced by 3-4 mm. There is a fracture of the left lamina, displaced 4 mm. The right articular mass of C1 has subluxed laterally in relation to the superior facet of C2, by 5 mm. There is 1-2 mm of lateral subluxation of the lateral C1 articular surface in relation to C2. No other fractures.  No bone lesions. Soft tissues and spinal canal: Mild anterior soft tissue edema, anterior to C1. No visible  canal hematoma. Disc levels: Mild loss of disc height at C3-C4-C4-C5. Acquired osseous fusion between C5 and C6. Marked loss of disc height with endplate spurring and sclerosis at C6-C7 and C7-T1. Bilateral facet degenerative changes. No convincing disc herniation. Upper chest: No acute findings. Other: None. IMPRESSION: HEAD CT 1. No intracranial abnormalities. 2. No skull fracture. CERVICAL CT 1. Jefferson type C1 fractures,, specifically the anterior arch to the left of midline and left lamina, with mild displacement of the right lateral mass in relation to the superior C2 articular surface, 5 mm, with similar but minimal old lateral displacement of the left lateral mass, 1-2 mm. Critical Value/emergent results were called by telephone at the time of interpretation on 09/15/2020 at 7:06 pm to ER provider, who verbally acknowledged these results. 2. No other fractures or acute skeletal  abnormality. Electronically Signed   By: Lajean Manes M.D.   On: 09/15/2020 19:07   MR Angiogram Neck W or Wo Contrast  Result Date: 09/16/2020 CLINICAL DATA:  Initial evaluation for acute trauma, cervical spine fracture. EXAM: MRA NECK WITHOUT AND WITH CONTRAST TECHNIQUE: Multiplanar and multiecho pulse sequences of the neck were obtained without and with intravenous contrast. Angiographic images of the neck were obtained using MRA technique without and with intravenous contrast. CONTRAST:  55mL GADAVIST GADOBUTROL 1 MMOL/ML IV SOLN COMPARISON:  Prior CT from 09/15/2020. FINDINGS: AORTIC ARCH: Visualized aortic arch of normal caliber with normal 3 vessel morphology. No hemodynamically significant stenosis or other abnormality about the origin of the great vessels. RIGHT CAROTID SYSTEM: Right CCA patent from its origin to the bifurcation without stenosis. Mild atheromatous narrowing at the origin of the right ICA of up to 35% by NASCET criteria. Right ICA otherwise patent distally to the circle-of-Willis without stenosis, dissection or occlusion. LEFT CAROTID SYSTEM: Left CCA patent from its origin to the bifurcation without stenosis. Atheromatous irregularity and narrowing of the proximal left ICA with associated stenosis of up to 70% by NASCET criteria. Stenosis begins at the bifurcation, and measures approximately 1.1 cm in length. Distally, left ICA patent to the circle-of-Willis without stenosis, dissection or occlusion. VERTEBRAL ARTERIES: Both vertebral arteries arise from the subclavian arteries. No proximal subclavian artery stenosis. Left vertebral artery dominant. Vertebral arteries patent within the neck without stenosis, dissection or occlusion. No evidence for acute traumatic injury related to the cervical spine fracture. Hypoplastic right vertebral artery appears to terminate in PICA. IMPRESSION: 1. No MRA evidence for acute traumatic injury to the major arterial vasculature of the neck. 2.  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 3. Mild 35% atheromatous narrowing at the origin of the right ICA. 4. Wide patency of the vertebral arteries within the neck. Left vertebral artery dominant, with the right vertebral artery terminating in PICA. Electronically Signed   By: Jeannine Boga M.D.   On: 09/16/2020 02:48   MRA negative for any acute vascular injuries.  Patient ands on were made aware of atherosclerotic carotid disease.  Will plan to d/c home with neurosurgery follow-up as per prior team.  Rx vicodin, states oxycodone makes her itch uncontrollable.  She is aware to stay in collar at all times, recommended to refrain from driving.  Return here for any new/acute changes.   Larene Pickett, PA-C 09/16/20 3762    Orpah Greek, MD 09/16/20 [phone]

## 2020-09-15 NOTE — ED Provider Notes (Signed)
[facility] EMERGENCY DEPARTMENT Provider Note   CSN: [id number] Arrival date & time: 09/15/20  1717     History Chief Complaint  Patient presents with  . Fall  . Neck Pain    Becky Hobbs is a 74 y.o. female.  74 year old female presents by EMS from home for neck and back pain after a fall that occurred yesterday.  Patient states that she tripped going up the steps and fell onto her head injuring her head and neck and has pain in her back.  Denies loss of consciousness, is not anticoagulated.  Reports abrasions to the lower legs however no significant extremity pain.  Reports feeling dizzy today with difficulty walking.  No other complaints or concerns.        Past Medical History:  Diagnosis Date  . Anxiety    panic attacks  . Arthritis   . Asthma   . Blood transfusion    1973--with twins birth  . Diabetes mellitus    borderline  . H/O hiatal hernia    surgery fixed  . Heart murmur    MVP  . Hypertension   . Hypothyroidism     Patient Active Problem List   Diagnosis Date Noted  . Plantar fasciitis of left foot 08/21/2018  . Acute renal failure (ARF) ([place]) 02/09/2018  . Chronic migraine without aura without status migrainosus, not intractable 06/16/2017  . History of lumbar fusion 08/03/2016    Class: Chronic  . Migraine 04/09/2016  . Acute-on-chronic kidney injury ([place]) 09/07/2015  . Hypertension 09/07/2015  . Hypothyroidism 09/07/2015  . Gout 09/07/2015  . Low back pain 09/07/2015  . Obesity (BMI 30-39.9) 07/06/2013  . Headache 04/12/2013  . Concussion 04/12/2013  . Lumbar spinal stenosis 03/04/2012    Class: Diagnosis of    Past Surgical History:  Procedure Laterality Date  . ABDOMINAL HYSTERECTOMY    . APPENDECTOMY    . BACK SURGERY    . bladder tack    . BREAST CYST EXCISION    . BREAST CYST INCISION AND DRAINAGE    . BREAST SURGERY    . BUNIONECTOMY    . CHOLECYSTECTOMY    . ESOPHAGOGASTRODUODENOSCOPY (EGD) WITH PROPOFOL N/A  01/03/2019   Procedure: ESOPHAGOGASTRODUODENOSCOPY (EGD) WITH PROPOFOL;  Surgeon: Otis Brace, MD;  Location: [facility];  Service: Gastroenterology;  Laterality: N/A;  . FOREIGN BODY REMOVAL  01/03/2019   Procedure: FOREIGN BODY REMOVAL;  Surgeon: Otis Brace, MD;  Location: [facility];  Service: Gastroenterology;;  . FRACTURE SURGERY     left wrist  . LUMBAR FUSION  07/26/2017  . LUMBAR LAMINECTOMY  03/04/2012   Procedure: MICRODISCECTOMY LUMBAR LAMINECTOMY;  Surgeon: Jessy Oto, MD;  Location: [facility];  Service: Orthopedics;  Laterality: N/A;  L3-4 central laminectomy  . NASAL SEPTUM SURGERY    . nissan fundoplication    . OVARIAN CYST SURGERY    . THORACIC FUSION  07/26/2017  . TONSILLECTOMY       OB History   No obstetric history on file.     Family History  Problem Relation Age of Onset  . Kidney disease Mother   . Vascular Disease Mother     Social History   Tobacco Use  . Smoking status: Never Smoker  . Smokeless tobacco: Never Used  Substance Use Topics  . Alcohol use: No  . Drug use: No    Home Medications Prior to Admission medications   Medication Sig Start Date End Date Taking? Authorizing Provider  ACCU-CHEK  FASTCLIX LANCETS MISC 2 (two) times daily. for testing 02/24/18   [provider]  albuterol (PROVENTIL HFA;VENTOLIN HFA) 108 (90 BASE) MCG/ACT inhaler Inhale 2 puffs into the lungs every 6 (six) hours as needed for wheezing or shortness of breath. 04/11/15   Melvenia Beam, MD  allopurinol (ZYLOPRIM) 100 MG tablet Take 100 mg by mouth daily.    [provider]  ALPRAZolam Duanne Moron) 0.5 MG tablet Take 0.5 mg by mouth daily as needed for anxiety.  02/13/13   [provider]  ALPRAZolam Duanne Moron) 0.5 MG tablet Take by mouth. 02/13/13   [provider]  aspirin EC 81 MG tablet Take 81 mg by mouth daily.    [provider]  bismuth subsalicylate (PEPTO BISMOL) 262 MG/15ML suspension Take 30 mLs by mouth  every 6 (six) hours as needed for indigestion or diarrhea or loose stools.    [provider]  cetirizine (ZYRTEC) 10 MG tablet Take 10 mg by mouth daily.    [provider]  Cholecalciferol 5000 units TABS Take 5,000 Units by mouth daily.     [provider]  ciprofloxacin (CIPRO) 250 MG tablet Take 250 mg by mouth 2 (two) times daily. 01/17/20   [provider]  EPINEPHrine (EPIPEN 2-PAK) 0.3 mg/0.3 mL IJ SOAJ injection Inject 0.3 mLs (0.3 mg total) into the skin once. Patient taking differently: Inject 0.3 mg into the skin daily as needed (allergic reaction).  04/11/15   Melvenia Beam, MD  EPINEPHrine 0.3 mg/0.3 mL IJ SOAJ injection INJECT INTRAMUSCULARLY AS DIRECTED 01/19/18   [provider]  fluticasone (FLONASE) 50 MCG/ACT nasal spray INT 2 SPRAYS INTO EACH NOSTRIL as needed 08/13/16   [provider]  furosemide (LASIX) 20 MG tablet Take 1 tablet (20 mg total) by mouth daily. Start in 2days on 5/8 02/14/18   Domenic Polite, MD  furosemide (LASIX) 20 MG tablet TAKE ONE TABLET BY MOUTH DAILY 12/19/17   [provider]  gabapentin (NEURONTIN) 300 MG capsule TAKE 1 CAPSULE BY MOUTH EVERY MORNING AND TAKE 2 CAPSULES EVERY NIGHT AT BEDTIME 02/03/18   [provider]  gabapentin (NEURONTIN) 300 MG capsule TAKE 1 CAPSULE BY MOUTH EVERY AM AND 2 CAPSULES EVERY NIGHT AT BEDTIME 08/30/18   Jessy Oto, MD  hydrALAZINE (APRESOLINE) 25 MG tablet Take 25 mg by mouth as needed.     [provider]  HYDROmorphone (DILAUDID) 2 MG tablet TK 1 T PO Q 6 H PRN FOR SEVERE PAIN FOR UP TO 5 DAYS 04/22/18   [provider]  levothyroxine (SYNTHROID, LEVOTHROID) 88 MCG tablet Take 88 mcg by mouth daily before breakfast.     [provider]  lidocaine (XYLOCAINE) 2 % solution Use as directed 15 mLs in the mouth or throat every 6 (six) hours as needed (throat pain). 12/26/18   Fatima Blank, MD  methylPREDNISolone  (MEDROL DOSEPAK) 4 MG TBPK tablet Take as directed 08/18/18   Trula Slade, DPM  metoprolol succinate (TOPROL-XL) 100 MG 24 hr tablet Take 100 mg by mouth daily. Take with or immediately following a meal.    [provider]  Multiple Vitamin (MULITIVITAMIN WITH MINERALS) TABS Take 1 tablet by mouth daily.    [provider]  ondansetron (ZOFRAN) 4 MG tablet Take 1 tablet (4 mg total) by mouth every 8 (eight) hours as needed for nausea. 04/12/13   Marcial Pacas, MD  oxyCODONE-acetaminophen (PERCOCET/ROXICET) 5-325 MG per tablet Take 1 tablet by mouth  every 6 (six) hours as needed for moderate pain.  01/25/15   [provider]  pantoprazole (PROTONIX) 40 MG tablet Take 40 mg by mouth daily.    [provider]  potassium chloride SA (K-DUR,KLOR-CON) 20 MEQ tablet Take 20 mEq by mouth daily as needed (fluid). Takes with torsemide.    [provider]  predniSONE (STERAPRED UNI-PAK 21 TAB) 5 MG (21) TBPK tablet  02/07/20   [provider]  rOPINIRole (REQUIP) 0.5 MG tablet Take 0.5 mg by mouth as needed.  12/14/16   [provider]  rosuvastatin (CRESTOR) 10 MG tablet Take 10 mg by mouth daily.  06/30/17   [provider]  sulfamethoxazole-trimethoprim (BACTRIM DS,SEPTRA DS) 800-160 MG tablet TK 1 T PO  BID 04/22/18   [provider]  SUMAtriptan (IMITREX) 100 MG tablet TAKE 1 TABLET(100 MG) BY MOUTH 1 TIME AS NEEDED FOR MIGRAINE. MAY REPEAT IN 2 HOURS IF HEADACHE PERSISTS OR RECURS 04/19/17   Melvenia Beam, MD  tiZANidine (ZANAFLEX) 2 MG tablet TAKE 1 TO 2 TABLETS BY MOUTH EVERY 6 HOURS AS NEEDED FOR SPASM 02/04/17   Jessy Oto, MD  tiZANidine (ZANAFLEX) 2 MG tablet TAKE 1 TO 2 TABLETS BY MOUTH EVERY 6 HOURS AS NEEDED FOR SPASM 05/18/19   Jessy Oto, MD  TRULICITY 1.5 ZO/1.0RU SOPN Inject 1.5 mg as directed once a week. 01/20/18   [provider]  zolpidem (AMBIEN) 10 MG tablet Take 10 mg by mouth at bedtime as needed  for sleep.  04/08/13   [provider]    Allergies    Bee venom, Buprenorphine hcl, Erythromycin, Other, Penicillins, Hydrocodone, Morphine and related, Tramadol hcl, Tramadol hcl, and Hydrocodone-acetaminophen  Review of Systems   Review of Systems  Constitutional: Negative for chills and fever.  Eyes: Negative for visual disturbance.  Respiratory: Negative for shortness of breath.   Cardiovascular: Negative for chest pain.  Gastrointestinal: Negative for abdominal pain.  Musculoskeletal: Positive for back pain and neck pain. Negative for arthralgias and joint swelling.  Skin: Positive for wound.  Allergic/Immunologic: Positive for immunocompromised state.  Neurological: Positive for dizziness and headaches. Negative for weakness and numbness.  Hematological: Does not bruise/bleed easily.  Psychiatric/Behavioral: Negative for confusion.  All other systems reviewed and are negative.   Physical Exam Updated Vital Signs BP (!) 168/67 (BP Location: Left Arm)   Pulse 82   Temp 98.4 F (36.9 C) (Oral)   Resp 16   Ht 5\' 4"  (1.626 m)   Wt 92.1 kg   SpO2 97%   BMI 34.84 kg/m   Physical Exam Vitals and nursing note reviewed.  Constitutional:      General: She is not in acute distress.    Appearance: She is well-developed. She is not diaphoretic.  HENT:     Head: Normocephalic and atraumatic.     Nose: Nose normal.     Mouth/Throat:     Mouth: Mucous membranes are moist.  Eyes:     Extraocular Movements: Extraocular movements intact.     Right eye: No nystagmus.     Left eye: No nystagmus.     Pupils: Pupils are equal, round, and reactive to light.  Cardiovascular:     Pulses: Normal pulses.  Pulmonary:     Effort: Pulmonary effort is normal.  Abdominal:     Palpations: Abdomen is soft.     Tenderness: There is no abdominal tenderness.  Musculoskeletal:        General: Tenderness  present. No swelling or deformity.     Cervical back: Tenderness present.      Right lower leg: No edema.     Left lower leg: No edema.     Comments: C-collar in place, tenderness to neck and back although difficult to assess as patient is in pain and does not tolerate exam well. No pain with range of motion of the extremities.  Minor abrasions noted to the anterior lower legs.  Equal arm and leg strength, sensation intact.  Skin:    General: Skin is warm and dry.     Findings: No erythema or rash.  Neurological:     General: No focal deficit present.     Mental Status: She is alert and oriented to person, place, and time.     Cranial Nerves: No cranial nerve deficit.     Sensory: No sensory deficit.     Motor: No weakness.     Coordination: Finger-Nose-Finger Test normal.  Psychiatric:        Behavior: Behavior normal.     ED Results / Procedures / Treatments   Labs (all labs ordered are listed, but only abnormal results are displayed) Labs Reviewed  CBC WITH DIFFERENTIAL/PLATELET - Abnormal; Notable for the following components:      Result Value   WBC 13.9 (*)    Hemoglobin 11.4 (*)    HCT 35.4 (*)    Neutro Abs 10.0 (*)    Monocytes Absolute 1.1 (*)    All other components within normal limits  BASIC METABOLIC PANEL - Abnormal; Notable for the following components:   Glucose, Bld 116 (*)    BUN 33 (*)    Creatinine, Ser 1.88 (*)    GFR, Estimated 28 (*)    All other components within normal limits  RESP PANEL BY RT-PCR (FLU A&B, COVID) [id number]    EKG None  Radiology DG Thoracic Spine 2 View  Result Date: 09/15/2020 CLINICAL DATA:  Back pain EXAM: THORACIC SPINE 2 VIEWS COMPARISON:  None. FINDINGS: There is no evidence of thoracic spine fracture. Spinal fixation hardware is seen. Mild disc height loss with anterior osteophytes seen in the mid to lower thoracic spine. IMPRESSION: No acute fracture or malalignment. Electronically Signed   By: Prudencio Pair M.D.   On: 09/15/2020 19:06   DG Lumbar Spine Complete  Result Date: 09/15/2020 CLINICAL  DATA:  Fall, back pain EXAM: LUMBAR SPINE - COMPLETE 4+ VIEW COMPARISON:  None. FINDINGS: There is no evidence of lumbar spine fracture. The patient is status post posterior spinal fixation with interbody fusion. No periprosthetic lucency is seen. Overlying scattered dense vascular calcifications are seen. IMPRESSION: No definite acute osseous abnormality. Electronically Signed   By: Prudencio Pair M.D.   On: 09/15/2020 19:05   CT Head Wo Contrast  Result Date: 09/15/2020 CLINICAL DATA:  [place] at home yesterday. Headache and some neck soreness. EXAM: CT HEAD WITHOUT CONTRAST CT CERVICAL SPINE WITHOUT CONTRAST TECHNIQUE: Multidetector CT imaging of the head and cervical spine was performed following the standard protocol without intravenous contrast. Multiplanar CT image reconstructions of the cervical spine were also generated. COMPARISON:  09/07/2015. FINDINGS: CT HEAD FINDINGS Brain: No evidence of acute infarction, hemorrhage, hydrocephalus, extra-axial collection or mass lesion/mass effect. There is mild sulcal enlargement consistent with age-appropriate volume loss. Vascular: No hyperdense vessel or unexpected calcification. Skull: Normal. Negative for fracture or focal lesion. Sinuses/Orbits: Globes and orbits are unremarkable. Visualized sinuses are clear. Other: None. CT CERVICAL SPINE FINDINGS Alignment: Grade  1 anterolisthesis of C3 and C4 and C4 on C5. No other spondylolisthesis. Mild kyphosis, apex at C5. Skull base and vertebrae: There are acute fractures of C1. There is a fracture of the anterior arch, just to the left of midline, displaced by 3-4 mm. There is a fracture of the left lamina, displaced 4 mm. The right articular mass of C1 has subluxed laterally in relation to the superior facet of C2, by 5 mm. There is 1-2 mm of lateral subluxation of the lateral C1 articular surface in relation to C2. No other fractures.  No bone lesions. Soft tissues and spinal canal: Mild anterior soft tissue edema,  anterior to C1. No visible canal hematoma. Disc levels: Mild loss of disc height at C3-C4-C4-C5. Acquired osseous fusion between C5 and C6. Marked loss of disc height with endplate spurring and sclerosis at C6-C7 and C7-T1. Bilateral facet degenerative changes. No convincing disc herniation. Upper chest: No acute findings. Other: None. IMPRESSION: HEAD CT 1. No intracranial abnormalities. 2. No skull fracture. CERVICAL CT 1. Jefferson type C1 fractures,, specifically the anterior arch to the left of midline and left lamina, with mild displacement of the right lateral mass in relation to the superior C2 articular surface, 5 mm, with similar but minimal old lateral displacement of the left lateral mass, 1-2 mm. Critical Value/emergent results were called by telephone at the time of interpretation on 09/15/2020 at 7:06 pm to ER provider, who verbally acknowledged these results. 2. No other fractures or acute skeletal abnormality. Electronically Signed   By: Lajean Manes M.D.   On: 09/15/2020 19:07   CT Cervical Spine Wo Contrast  Result Date: 09/15/2020 CLINICAL DATA:  [place] at home yesterday. Headache and some neck soreness. EXAM: CT HEAD WITHOUT CONTRAST CT CERVICAL SPINE WITHOUT CONTRAST TECHNIQUE: Multidetector CT imaging of the head and cervical spine was performed following the standard protocol without intravenous contrast. Multiplanar CT image reconstructions of the cervical spine were also generated. COMPARISON:  09/07/2015. FINDINGS: CT HEAD FINDINGS Brain: No evidence of acute infarction, hemorrhage, hydrocephalus, extra-axial collection or mass lesion/mass effect. There is mild sulcal enlargement consistent with age-appropriate volume loss. Vascular: No hyperdense vessel or unexpected calcification. Skull: Normal. Negative for fracture or focal lesion. Sinuses/Orbits: Globes and orbits are unremarkable. Visualized sinuses are clear. Other: None. CT CERVICAL SPINE FINDINGS Alignment: Grade 1  anterolisthesis of C3 and C4 and C4 on C5. No other spondylolisthesis. Mild kyphosis, apex at C5. Skull base and vertebrae: There are acute fractures of C1. There is a fracture of the anterior arch, just to the left of midline, displaced by 3-4 mm. There is a fracture of the left lamina, displaced 4 mm. The right articular mass of C1 has subluxed laterally in relation to the superior facet of C2, by 5 mm. There is 1-2 mm of lateral subluxation of the lateral C1 articular surface in relation to C2. No other fractures.  No bone lesions. Soft tissues and spinal canal: Mild anterior soft tissue edema, anterior to C1. No visible canal hematoma. Disc levels: Mild loss of disc height at C3-C4-C4-C5. Acquired osseous fusion between C5 and C6. Marked loss of disc height with endplate spurring and sclerosis at C6-C7 and C7-T1. Bilateral facet degenerative changes. No convincing disc herniation. Upper chest: No acute findings. Other: None. IMPRESSION: HEAD CT 1. No intracranial abnormalities. 2. No skull fracture. CERVICAL CT 1. Jefferson type C1 fractures,, specifically the anterior arch to the left of midline and left lamina, with mild displacement of the right  lateral mass in relation to the superior C2 articular surface, 5 mm, with similar but minimal old lateral displacement of the left lateral mass, 1-2 mm. Critical Value/emergent results were called by telephone at the time of interpretation on 09/15/2020 at 7:06 pm to ER provider, who verbally acknowledged these results. 2. No other fractures or acute skeletal abnormality. Electronically Signed   By: Lajean Manes M.D.   On: 09/15/2020 19:07    Procedures Procedures (including critical care time)  Medications Ordered in ED Medications  HYDROcodone-acetaminophen (NORCO/VICODIN) 5-325 MG per tablet 1 tablet (1 tablet Oral Given by Other 09/15/20 1900)  LORazepam (ATIVAN) injection 0.5 mg (0.5 mg Intravenous Given 09/15/20 1957)    ED Course  I have reviewed the  triage vital signs and the nursing notes.  Pertinent labs & imaging results that were available during my care of the patient were reviewed by me and considered in my medical decision making (see chart for details).  Clinical Course as of Sep 16 2103  Nancy Fetter Sep 16, 4075  4425 74 year old female presents for evaluation of her fall which occurred yesterday.  Patient has a video from her doorbell camera which shows that she went up the steps of the doorway, tripped and hit her forehead into the frame of the door.  Patient was able to stand on her own and ambulate into the house after the fall, no loss of consciousness, she is not anticoagulated.  Patient states that she is at a headache today, has been feeling dizzy with difficulty walking. Normal neuro exam, no nystagmus, normal finger-to-nose, normal grip strength and leg strength. CT head and C-spine as well as x-rays of T-spine and L-spine obtained. Patient was found to have a C1 fracture and was placed in an Aspen collar. Discussed with Dr. Kathrynn Humble, considered CTA neck however due to poor kidney status, decision to image further was held pending labs.   [LM]  2020 Labs reviewed, CBC with nonspecific leukocytosis of 13.9, BMP with creatinine of 1.88, GFR of 28. Is to discuss with neurosurgery, plan for transfer to [facility]. Patient was given Norco for her pain and Ativan as she is feeling anxious.   [LM]  2058 Discussed with Dr. Reatha Armour with neurosurgery who has reviewed the CT images, patient may be dc with 24/7 aspen collar to follow up in the office in 2 weeks. Discussed with Dr. Kathrynn Humble, concern for complaints of dizziness and balance problems today. Unable to CTA neck due to GFR 28. Plan is to send ED to ED to [facility] for MRA neck. Discussed with Dr. Laverta Baltimore, ER attending at [facility] who accepts patient in transfer. If MRA neck negative for vascular injury or other concerning findings, patient may be dc home to follow up per neurosurgery  recommendations.    [LM]  2101 Discussed plan of care with patient and her son at bedside.    [LM]    Clinical Course User Index [LM] Roque Lias   MDM Rules/Calculators/A&P                          Final Clinical Impression(s) / ED Diagnoses Final diagnoses:  Closed displaced fracture of first cervical vertebra, unspecified fracture morphology, initial encounter [facility])  Fall in home, initial encounter    Rx / DC Orders ED Discharge Orders    None       Roque Lias 09/15/20 2104    Varney Biles, MD 09/16/20 1652

## 2020-09-15 NOTE — Discharge Instructions (Addendum)
Wear aspen collar all the time (24/7). Follow up with neurosurgery, call to schedule an appointment on Monday. Take pain medication as directed.  Do not take additional tylenol with this but can take motrin.  I would avoid driving right now. Return here for any new/acute changes.

## 2020-09-16 ENCOUNTER — Emergency Department (HOSPITAL_COMMUNITY): Payer: Medicare Other

## 2020-09-16 DIAGNOSIS — S12000A Unspecified displaced fracture of first cervical vertebra, initial encounter for closed fracture: Secondary | ICD-10-CM | POA: Diagnosis not present

## 2020-09-16 DIAGNOSIS — S199XXA Unspecified injury of neck, initial encounter: Secondary | ICD-10-CM | POA: Diagnosis not present

## 2020-09-16 DIAGNOSIS — Q283 Other malformations of cerebral vessels: Secondary | ICD-10-CM | POA: Diagnosis not present

## 2020-09-16 DIAGNOSIS — I6523 Occlusion and stenosis of bilateral carotid arteries: Secondary | ICD-10-CM | POA: Diagnosis not present

## 2020-09-16 MED ORDER — HYDROCODONE-ACETAMINOPHEN 5-325 MG PO TABS
1.0000 | ORAL_TABLET | ORAL | 0 refills | Status: DC | PRN
Start: 2020-09-16 — End: 2021-06-09

## 2020-09-16 MED ORDER — GADOBUTROL 1 MMOL/ML IV SOLN
9.0000 mL | Freq: Once | INTRAVENOUS | Status: AC | PRN
  Administered 2020-09-16: 9 mL via INTRAVENOUS

## 2020-09-16 MED ORDER — FENTANYL CITRATE (PF) 100 MCG/2ML IJ SOLN
50.0000 ug | Freq: Once | INTRAMUSCULAR | Status: AC
  Administered 2020-09-16: 50 ug via INTRAVENOUS
  Filled 2020-09-16: qty 2

## 2020-09-16 NOTE — ED Notes (Signed)
Pt in MRI.

## 2020-09-18 DIAGNOSIS — S1202XA Unstable burst fracture of first cervical vertebra, initial encounter for closed fracture: Secondary | ICD-10-CM | POA: Diagnosis not present

## 2020-09-18 DIAGNOSIS — M47814 Spondylosis without myelopathy or radiculopathy, thoracic region: Secondary | ICD-10-CM | POA: Diagnosis not present

## 2020-09-18 DIAGNOSIS — M542 Cervicalgia: Secondary | ICD-10-CM | POA: Diagnosis not present

## 2020-09-18 DIAGNOSIS — I10 Essential (primary) hypertension: Secondary | ICD-10-CM | POA: Diagnosis not present

## 2020-10-18 ENCOUNTER — Other Ambulatory Visit: Payer: Self-pay | Admitting: Neurological Surgery

## 2020-10-18 DIAGNOSIS — S12000A Unspecified displaced fracture of first cervical vertebra, initial encounter for closed fracture: Secondary | ICD-10-CM

## 2020-10-27 ENCOUNTER — Other Ambulatory Visit: Payer: Medicare Other

## 2020-11-04 ENCOUNTER — Ambulatory Visit
Admission: RE | Admit: 2020-11-04 | Discharge: 2020-11-04 | Disposition: A | Discharge status: Home / Self Care | Payer: Medicare Other | Source: Ambulatory Visit | Attending: Neurological Surgery | Admitting: Neurological Surgery

## 2020-11-04 ENCOUNTER — Other Ambulatory Visit: Payer: Self-pay

## 2020-11-04 ENCOUNTER — Other Ambulatory Visit: Payer: Self-pay | Admitting: Neurological Surgery

## 2020-11-04 DIAGNOSIS — M549 Dorsalgia, unspecified: Secondary | ICD-10-CM

## 2020-11-04 DIAGNOSIS — M4326 Fusion of spine, lumbar region: Secondary | ICD-10-CM | POA: Diagnosis not present

## 2020-11-04 DIAGNOSIS — G4489 Other headache syndrome: Secondary | ICD-10-CM

## 2020-11-04 DIAGNOSIS — M5134 Other intervertebral disc degeneration, thoracic region: Secondary | ICD-10-CM | POA: Diagnosis not present

## 2020-11-04 DIAGNOSIS — S12000A Unspecified displaced fracture of first cervical vertebra, initial encounter for closed fracture: Secondary | ICD-10-CM

## 2020-11-04 DIAGNOSIS — M4802 Spinal stenosis, cervical region: Secondary | ICD-10-CM | POA: Diagnosis not present

## 2020-11-05 ENCOUNTER — Other Ambulatory Visit: Payer: Self-pay | Admitting: Neurological Surgery

## 2020-11-05 DIAGNOSIS — S12000A Unspecified displaced fracture of first cervical vertebra, initial encounter for closed fracture: Secondary | ICD-10-CM

## 2020-11-07 DIAGNOSIS — S1202XA Unstable burst fracture of first cervical vertebra, initial encounter for closed fracture: Secondary | ICD-10-CM | POA: Diagnosis not present

## 2020-11-07 DIAGNOSIS — I10 Essential (primary) hypertension: Secondary | ICD-10-CM | POA: Diagnosis not present

## 2020-11-07 DIAGNOSIS — M47814 Spondylosis without myelopathy or radiculopathy, thoracic region: Secondary | ICD-10-CM | POA: Diagnosis not present

## 2020-11-07 DIAGNOSIS — Z79891 Long term (current) use of opiate analgesic: Secondary | ICD-10-CM | POA: Diagnosis not present

## 2020-11-07 DIAGNOSIS — G894 Chronic pain syndrome: Secondary | ICD-10-CM | POA: Diagnosis not present

## 2020-11-07 DIAGNOSIS — Z6838 Body mass index (BMI) 38.0-38.9, adult: Secondary | ICD-10-CM | POA: Diagnosis not present

## 2020-11-07 DIAGNOSIS — Z79899 Other long term (current) drug therapy: Secondary | ICD-10-CM | POA: Diagnosis not present

## 2020-11-13 DIAGNOSIS — M545 Low back pain, unspecified: Secondary | ICD-10-CM | POA: Diagnosis not present

## 2020-11-13 DIAGNOSIS — M5412 Radiculopathy, cervical region: Secondary | ICD-10-CM | POA: Diagnosis not present

## 2020-11-13 DIAGNOSIS — S129XXA Fracture of neck, unspecified, initial encounter: Secondary | ICD-10-CM | POA: Diagnosis not present

## 2020-11-23 ENCOUNTER — Ambulatory Visit
Admission: RE | Admit: 2020-11-23 | Discharge: 2020-11-23 | Disposition: A | Discharge status: Home / Self Care | Payer: Medicare Other | Source: Ambulatory Visit | Attending: Neurological Surgery | Admitting: Neurological Surgery

## 2020-11-23 ENCOUNTER — Other Ambulatory Visit: Payer: Self-pay

## 2020-11-23 DIAGNOSIS — H5710 Ocular pain, unspecified eye: Secondary | ICD-10-CM | POA: Diagnosis not present

## 2020-11-23 DIAGNOSIS — R519 Headache, unspecified: Secondary | ICD-10-CM | POA: Diagnosis not present

## 2020-11-23 DIAGNOSIS — G4489 Other headache syndrome: Secondary | ICD-10-CM

## 2020-11-23 DIAGNOSIS — S12000A Unspecified displaced fracture of first cervical vertebra, initial encounter for closed fracture: Secondary | ICD-10-CM

## 2020-11-25 DIAGNOSIS — Z6837 Body mass index (BMI) 37.0-37.9, adult: Secondary | ICD-10-CM | POA: Diagnosis not present

## 2020-11-25 DIAGNOSIS — S12000A Unspecified displaced fracture of first cervical vertebra, initial encounter for closed fracture: Secondary | ICD-10-CM | POA: Diagnosis not present

## 2020-12-05 DIAGNOSIS — M47814 Spondylosis without myelopathy or radiculopathy, thoracic region: Secondary | ICD-10-CM | POA: Diagnosis not present

## 2020-12-05 DIAGNOSIS — Z6838 Body mass index (BMI) 38.0-38.9, adult: Secondary | ICD-10-CM | POA: Diagnosis not present

## 2020-12-05 DIAGNOSIS — S1202XA Unstable burst fracture of first cervical vertebra, initial encounter for closed fracture: Secondary | ICD-10-CM | POA: Diagnosis not present

## 2020-12-05 DIAGNOSIS — M791 Myalgia, unspecified site: Secondary | ICD-10-CM | POA: Diagnosis not present

## 2020-12-25 DIAGNOSIS — N184 Chronic kidney disease, stage 4 (severe): Secondary | ICD-10-CM | POA: Diagnosis not present

## 2021-01-08 DIAGNOSIS — I10 Essential (primary) hypertension: Secondary | ICD-10-CM | POA: Diagnosis not present

## 2021-01-08 DIAGNOSIS — Z6838 Body mass index (BMI) 38.0-38.9, adult: Secondary | ICD-10-CM | POA: Diagnosis not present

## 2021-01-08 DIAGNOSIS — S12000A Unspecified displaced fracture of first cervical vertebra, initial encounter for closed fracture: Secondary | ICD-10-CM | POA: Diagnosis not present

## 2021-01-09 DIAGNOSIS — E785 Hyperlipidemia, unspecified: Secondary | ICD-10-CM | POA: Diagnosis not present

## 2021-01-09 DIAGNOSIS — N184 Chronic kidney disease, stage 4 (severe): Secondary | ICD-10-CM | POA: Diagnosis not present

## 2021-01-09 DIAGNOSIS — K219 Gastro-esophageal reflux disease without esophagitis: Secondary | ICD-10-CM | POA: Diagnosis not present

## 2021-01-09 DIAGNOSIS — E1122 Type 2 diabetes mellitus with diabetic chronic kidney disease: Secondary | ICD-10-CM | POA: Diagnosis not present

## 2021-01-09 DIAGNOSIS — E1129 Type 2 diabetes mellitus with other diabetic kidney complication: Secondary | ICD-10-CM | POA: Diagnosis not present

## 2021-01-09 DIAGNOSIS — I129 Hypertensive chronic kidney disease with stage 1 through stage 4 chronic kidney disease, or unspecified chronic kidney disease: Secondary | ICD-10-CM | POA: Diagnosis not present

## 2021-01-09 DIAGNOSIS — M109 Gout, unspecified: Secondary | ICD-10-CM | POA: Diagnosis not present

## 2021-01-09 DIAGNOSIS — J309 Allergic rhinitis, unspecified: Secondary | ICD-10-CM | POA: Diagnosis not present

## 2021-01-09 DIAGNOSIS — G43909 Migraine, unspecified, not intractable, without status migrainosus: Secondary | ICD-10-CM | POA: Diagnosis not present

## 2021-01-09 DIAGNOSIS — E039 Hypothyroidism, unspecified: Secondary | ICD-10-CM | POA: Diagnosis not present

## 2021-01-23 DIAGNOSIS — E785 Hyperlipidemia, unspecified: Secondary | ICD-10-CM | POA: Diagnosis not present

## 2021-01-23 DIAGNOSIS — Z23 Encounter for immunization: Secondary | ICD-10-CM | POA: Diagnosis not present

## 2021-01-23 DIAGNOSIS — I129 Hypertensive chronic kidney disease with stage 1 through stage 4 chronic kidney disease, or unspecified chronic kidney disease: Secondary | ICD-10-CM | POA: Diagnosis not present

## 2021-01-23 DIAGNOSIS — E119 Type 2 diabetes mellitus without complications: Secondary | ICD-10-CM | POA: Diagnosis not present

## 2021-01-23 DIAGNOSIS — E039 Hypothyroidism, unspecified: Secondary | ICD-10-CM | POA: Diagnosis not present

## 2021-01-23 DIAGNOSIS — R82998 Other abnormal findings in urine: Secondary | ICD-10-CM | POA: Diagnosis not present

## 2021-01-23 DIAGNOSIS — M109 Gout, unspecified: Secondary | ICD-10-CM | POA: Diagnosis not present

## 2021-01-23 DIAGNOSIS — N184 Chronic kidney disease, stage 4 (severe): Secondary | ICD-10-CM | POA: Diagnosis not present

## 2021-01-30 DIAGNOSIS — E1129 Type 2 diabetes mellitus with other diabetic kidney complication: Secondary | ICD-10-CM | POA: Diagnosis not present

## 2021-01-30 DIAGNOSIS — I129 Hypertensive chronic kidney disease with stage 1 through stage 4 chronic kidney disease, or unspecified chronic kidney disease: Secondary | ICD-10-CM | POA: Diagnosis not present

## 2021-01-30 DIAGNOSIS — E669 Obesity, unspecified: Secondary | ICD-10-CM | POA: Diagnosis not present

## 2021-01-30 DIAGNOSIS — G629 Polyneuropathy, unspecified: Secondary | ICD-10-CM | POA: Diagnosis not present

## 2021-01-30 DIAGNOSIS — E039 Hypothyroidism, unspecified: Secondary | ICD-10-CM | POA: Diagnosis not present

## 2021-01-30 DIAGNOSIS — Z1339 Encounter for screening examination for other mental health and behavioral disorders: Secondary | ICD-10-CM | POA: Diagnosis not present

## 2021-01-30 DIAGNOSIS — B1920 Unspecified viral hepatitis C without hepatic coma: Secondary | ICD-10-CM | POA: Diagnosis not present

## 2021-01-30 DIAGNOSIS — E785 Hyperlipidemia, unspecified: Secondary | ICD-10-CM | POA: Diagnosis not present

## 2021-01-30 DIAGNOSIS — N184 Chronic kidney disease, stage 4 (severe): Secondary | ICD-10-CM | POA: Diagnosis not present

## 2021-01-30 DIAGNOSIS — K7460 Unspecified cirrhosis of liver: Secondary | ICD-10-CM | POA: Diagnosis not present

## 2021-01-30 DIAGNOSIS — G43909 Migraine, unspecified, not intractable, without status migrainosus: Secondary | ICD-10-CM | POA: Diagnosis not present

## 2021-01-30 DIAGNOSIS — Z1331 Encounter for screening for depression: Secondary | ICD-10-CM | POA: Diagnosis not present

## 2021-01-30 DIAGNOSIS — Z0000 Encounter for general adult medical examination without abnormal findings: Secondary | ICD-10-CM | POA: Diagnosis not present

## 2021-02-04 DIAGNOSIS — K7460 Unspecified cirrhosis of liver: Secondary | ICD-10-CM | POA: Diagnosis not present

## 2021-02-04 DIAGNOSIS — Z9189 Other specified personal risk factors, not elsewhere classified: Secondary | ICD-10-CM | POA: Diagnosis not present

## 2021-02-05 ENCOUNTER — Other Ambulatory Visit: Payer: Self-pay | Admitting: Nurse Practitioner

## 2021-02-05 DIAGNOSIS — K7460 Unspecified cirrhosis of liver: Secondary | ICD-10-CM

## 2021-02-08 DIAGNOSIS — E1122 Type 2 diabetes mellitus with diabetic chronic kidney disease: Secondary | ICD-10-CM | POA: Diagnosis not present

## 2021-02-08 DIAGNOSIS — N184 Chronic kidney disease, stage 4 (severe): Secondary | ICD-10-CM | POA: Diagnosis not present

## 2021-02-08 DIAGNOSIS — I129 Hypertensive chronic kidney disease with stage 1 through stage 4 chronic kidney disease, or unspecified chronic kidney disease: Secondary | ICD-10-CM | POA: Diagnosis not present

## 2021-02-08 DIAGNOSIS — E785 Hyperlipidemia, unspecified: Secondary | ICD-10-CM | POA: Diagnosis not present

## 2021-02-13 DIAGNOSIS — M542 Cervicalgia: Secondary | ICD-10-CM | POA: Diagnosis not present

## 2021-02-13 DIAGNOSIS — S1202XA Unstable burst fracture of first cervical vertebra, initial encounter for closed fracture: Secondary | ICD-10-CM | POA: Diagnosis not present

## 2021-02-13 DIAGNOSIS — M47892 Other spondylosis, cervical region: Secondary | ICD-10-CM | POA: Diagnosis not present

## 2021-02-13 DIAGNOSIS — Z6838 Body mass index (BMI) 38.0-38.9, adult: Secondary | ICD-10-CM | POA: Diagnosis not present

## 2021-02-13 DIAGNOSIS — S1201XS Stable burst fracture of first cervical vertebra, sequela: Secondary | ICD-10-CM | POA: Diagnosis not present

## 2021-02-27 ENCOUNTER — Ambulatory Visit
Admission: RE | Admit: 2021-02-27 | Discharge: 2021-02-27 | Disposition: A | Discharge status: Home / Self Care | Payer: Medicare Other | Source: Ambulatory Visit | Attending: Nurse Practitioner | Admitting: Nurse Practitioner

## 2021-02-27 DIAGNOSIS — S1201XK Stable burst fracture of first cervical vertebra, subsequent encounter for fracture with nonunion: Secondary | ICD-10-CM | POA: Diagnosis not present

## 2021-02-27 DIAGNOSIS — M4312 Spondylolisthesis, cervical region: Secondary | ICD-10-CM | POA: Diagnosis not present

## 2021-02-27 DIAGNOSIS — K7460 Unspecified cirrhosis of liver: Secondary | ICD-10-CM

## 2021-02-27 DIAGNOSIS — M50322 Other cervical disc degeneration at C5-C6 level: Secondary | ICD-10-CM | POA: Diagnosis not present

## 2021-03-11 DIAGNOSIS — N184 Chronic kidney disease, stage 4 (severe): Secondary | ICD-10-CM | POA: Diagnosis not present

## 2021-03-11 DIAGNOSIS — E1122 Type 2 diabetes mellitus with diabetic chronic kidney disease: Secondary | ICD-10-CM | POA: Diagnosis not present

## 2021-03-11 DIAGNOSIS — I129 Hypertensive chronic kidney disease with stage 1 through stage 4 chronic kidney disease, or unspecified chronic kidney disease: Secondary | ICD-10-CM | POA: Diagnosis not present

## 2021-03-11 DIAGNOSIS — E785 Hyperlipidemia, unspecified: Secondary | ICD-10-CM | POA: Diagnosis not present

## 2021-03-17 DIAGNOSIS — Z1212 Encounter for screening for malignant neoplasm of rectum: Secondary | ICD-10-CM | POA: Diagnosis not present

## 2021-04-09 DIAGNOSIS — M542 Cervicalgia: Secondary | ICD-10-CM | POA: Diagnosis not present

## 2021-04-09 DIAGNOSIS — S12000K Unspecified displaced fracture of first cervical vertebra, subsequent encounter for fracture with nonunion: Secondary | ICD-10-CM | POA: Diagnosis not present

## 2021-04-10 DIAGNOSIS — E785 Hyperlipidemia, unspecified: Secondary | ICD-10-CM | POA: Diagnosis not present

## 2021-04-10 DIAGNOSIS — I129 Hypertensive chronic kidney disease with stage 1 through stage 4 chronic kidney disease, or unspecified chronic kidney disease: Secondary | ICD-10-CM | POA: Diagnosis not present

## 2021-04-10 DIAGNOSIS — E1122 Type 2 diabetes mellitus with diabetic chronic kidney disease: Secondary | ICD-10-CM | POA: Diagnosis not present

## 2021-04-10 DIAGNOSIS — N184 Chronic kidney disease, stage 4 (severe): Secondary | ICD-10-CM | POA: Diagnosis not present

## 2021-04-21 DIAGNOSIS — S12000A Unspecified displaced fracture of first cervical vertebra, initial encounter for closed fracture: Secondary | ICD-10-CM | POA: Diagnosis not present

## 2021-04-21 DIAGNOSIS — N184 Chronic kidney disease, stage 4 (severe): Secondary | ICD-10-CM | POA: Diagnosis not present

## 2021-04-21 DIAGNOSIS — G4489 Other headache syndrome: Secondary | ICD-10-CM | POA: Diagnosis not present

## 2021-04-21 DIAGNOSIS — K7460 Unspecified cirrhosis of liver: Secondary | ICD-10-CM | POA: Diagnosis not present

## 2021-04-22 ENCOUNTER — Other Ambulatory Visit: Payer: Self-pay | Admitting: Neurological Surgery

## 2021-04-23 ENCOUNTER — Other Ambulatory Visit: Payer: Self-pay | Admitting: Neurological Surgery

## 2021-05-26 NOTE — Pre-Procedure Instructions (Signed)
Surgical Instructions    Your procedure is scheduled on Thursday, August 25th.  Report to [facility] Main Entrance "A" at 5:30 A.M., then check in with the Admitting office.  Call this number if you have problems the morning of surgery:  [phone]   If you have any questions prior to your surgery date call [phone]: Open Monday-Friday 8am-4pm    Remember:  Do not eat or drink after midnight the night before your surgery   Take these medicines the morning of surgery with A SIP OF WATER  allopurinol (ZYLOPRIM) fluticasone (FLONASE) levothyroxine (SYNTHROID) loratadine (CLARITIN) metoprolol (TOPROL-XL) mirabegron ER (MYRBETRIQ) pantoprazole (PROTONIX)  Eye drops rosuvastatin (CRESTOR) Vibegron (GEMTESA)   Take these medications as needed: ALPRAZolam (XANAX)   hydrALAZINE (APRESOLINE)  hydrOXYzine (ATARAX/VISTARIL) ondansetron (ZOFRAN)  oxyCODONE-acetaminophen (PERCOCET)  rOPINIRole (REQUIP)  SUMAtriptan (IMITREX) tiZANidine (ZANAFLEX)   STOP PHENTERMINE AS OF TODAY.   As of today, STOP taking any Aspirin (unless otherwise instructed by your surgeon) Aleve, Naproxen, Ibuprofen, Motrin, Advil, Goody's, BC's, all herbal medications, fish oil, and all vitamins.          WHAT DO I DO ABOUT MY DIABETES MEDICATION?  The day of surgery, do not take other diabetes injectables, Trulicity (dulaglutide).   HOW TO MANAGE YOUR DIABETES BEFORE AND AFTER SURGERY  Why is it important to control my blood sugar before and after surgery? Improving blood sugar levels before and after surgery helps healing and can limit problems. A way of improving blood sugar control is eating a healthy diet by:  Eating less sugar and carbohydrates  Increasing activity/exercise  Talking with your doctor about reaching your blood sugar goals High blood sugars (greater than 180 mg/dL) can raise your risk of infections and slow your recovery, so you will need to focus on controlling your diabetes  during the weeks before surgery. Make sure that the doctor who takes care of your diabetes knows about your planned surgery including the date and location.  How do I manage my blood sugar before surgery? Check your blood sugar at least 4 times a day, starting 2 days before surgery, to make sure that the level is not too high or low.  Check your blood sugar the morning of your surgery when you wake up and every 2 hours until you get to the Short Stay unit.  If your blood sugar is less than 70 mg/dL, you will need to treat for low blood sugar: Do not take insulin. Treat a low blood sugar (less than 70 mg/dL) with  cup of clear juice (cranberry or apple), 4 glucose tablets, OR glucose gel. Recheck blood sugar in 15 minutes after treatment (to make sure it is greater than 70 mg/dL). If your blood sugar is not greater than 70 mg/dL on recheck, call [phone] for further instructions. Report your blood sugar to the short stay nurse when you get to Short Stay.  If you are admitted to the hospital after surgery: Your blood sugar will be checked by the staff and you will probably be given insulin after surgery (instead of oral diabetes medicines) to make sure you have good blood sugar levels. The goal for blood sugar control after surgery is 80-180 mg/dL.             Do NOT Smoke (Tobacco/Vaping) or drink Alcohol 24 hours prior to your procedure.  If you use a CPAP at night, you may bring all equipment for your overnight stay.   Contacts, glasses, piercing's, hearing aid's, dentures  or partials may not be worn into surgery, please bring cases for these belongings.    For patients admitted to the hospital, discharge time will be determined by your treatment team.   Patients discharged the day of surgery will not be allowed to drive home, and someone needs to stay with them for 24 hours.  ONLY 1 SUPPORT PERSON MAY BE PRESENT WHILE YOU ARE IN SURGERY. IF YOU ARE TO BE ADMITTED ONCE YOU ARE IN  YOUR ROOM YOU WILL BE ALLOWED TWO (2) VISITORS.  Minor children may have two parents present. Special consideration for safety and communication needs will be reviewed on a case by case basis.   Special instructions:   [facility]- Preparing For Surgery  Before surgery, you can play an important role. Because skin is not sterile, your skin needs to be as free of germs as possible. You can reduce the number of germs on your skin by washing with CHG (chlorahexidine gluconate) Soap before surgery.  CHG is an antiseptic cleaner which kills germs and bonds with the skin to continue killing germs even after washing.    Oral Hygiene is also important to reduce your risk of infection.  Remember - BRUSH YOUR TEETH THE MORNING OF SURGERY WITH YOUR REGULAR TOOTHPASTE  Please do not use if you have an allergy to CHG or antibacterial soaps. If your skin becomes reddened/irritated stop using the CHG.  Do not shave (including legs and underarms) for at least 48 hours prior to first CHG shower. It is OK to shave your face.  Please follow these instructions carefully.   Shower the NIGHT BEFORE SURGERY and the MORNING OF SURGERY  If you chose to wash your hair, wash your hair first as usual with your normal shampoo.  After you shampoo, rinse your hair and body thoroughly to remove the shampoo.  Use CHG Soap as you would any other liquid soap. You can apply CHG directly to the skin and wash gently with a scrungie or a clean washcloth.   Apply the CHG Soap to your body ONLY FROM THE NECK DOWN.  Do not use on open wounds or open sores. Avoid contact with your eyes, ears, mouth and genitals (private parts). Wash Face and genitals (private parts)  with your normal soap.   Wash thoroughly, paying special attention to the area where your surgery will be performed.  Thoroughly rinse your body with warm water from the neck down.  DO NOT shower/wash with your normal soap after using and rinsing off the CHG  Soap.  Pat yourself dry with a CLEAN TOWEL.  Wear CLEAN PAJAMAS to bed the night before surgery  Place CLEAN SHEETS on your bed the night before your surgery  DO NOT SLEEP WITH PETS.   Day of Surgery: Shower with CHG soap. Do not wear jewelry, make up, nail polish, gel polish, artificial nails, or any other type of covering on natural nails including finger and toenails. If patients have artificial nails, gel coating, etc. that need to be removed by a nail salon please have this removed prior to surgery. Surgery may need to be canceled/delayed if the surgeon/ anesthesia feels like the patient is unable to be adequately monitored. Do not wear lotions, powders, perfumes, or deodorant. Do not shave 48 hours prior to surgery.   Do not bring valuables to the hospital.  [facility] is not responsible for any belongings or valuables. Wear Clean/Comfortable clothing the morning of surgery Remember to brush your teeth WITH YOUR  REGULAR TOOTHPASTE.   Please read over the following fact sheets that you were given.

## 2021-05-27 ENCOUNTER — Other Ambulatory Visit: Payer: Self-pay

## 2021-05-27 ENCOUNTER — Encounter (HOSPITAL_COMMUNITY): Payer: Self-pay

## 2021-05-27 ENCOUNTER — Encounter (HOSPITAL_COMMUNITY)
Admission: RE | Admit: 2021-05-27 | Discharge: 2021-05-27 | Disposition: A | Discharge status: Home / Self Care | Payer: Medicare Other | Source: Ambulatory Visit | Attending: Neurological Surgery | Admitting: Neurological Surgery

## 2021-05-27 DIAGNOSIS — S12000A Unspecified displaced fracture of first cervical vertebra, initial encounter for closed fracture: Secondary | ICD-10-CM | POA: Diagnosis not present

## 2021-05-27 DIAGNOSIS — X58XXXA Exposure to other specified factors, initial encounter: Secondary | ICD-10-CM | POA: Insufficient documentation

## 2021-05-27 DIAGNOSIS — Z01818 Encounter for other preprocedural examination: Secondary | ICD-10-CM | POA: Diagnosis not present

## 2021-05-27 DIAGNOSIS — T1490XS Injury, unspecified, sequela: Secondary | ICD-10-CM | POA: Insufficient documentation

## 2021-05-27 HISTORY — DX: Headache, unspecified: R51.9

## 2021-05-27 HISTORY — DX: Chronic kidney disease, unspecified: N18.9

## 2021-05-27 HISTORY — DX: Pneumonia, unspecified organism: J18.9

## 2021-05-27 HISTORY — DX: Bronchitis, not specified as acute or chronic: J40

## 2021-05-27 HISTORY — DX: Inflammatory liver disease, unspecified: K75.9

## 2021-05-27 HISTORY — DX: Anemia, unspecified: D64.9

## 2021-05-27 HISTORY — DX: Gastro-esophageal reflux disease without esophagitis: K21.9

## 2021-05-27 LAB — COMPREHENSIVE METABOLIC PANEL
ALT: 14 U/L (ref 0–44)
AST: 23 U/L (ref 15–41)
Albumin: 3.5 g/dL (ref 3.5–5.0)
Alkaline Phosphatase: 75 U/L (ref 38–126)
Anion gap: 11 (ref 5–15)
BUN: 56 mg/dL — ABNORMAL HIGH (ref 8–23)
CO2: 31 mmol/L (ref 22–32)
Calcium: 9.7 mg/dL (ref 8.9–10.3)
Chloride: 93 mmol/L — ABNORMAL LOW (ref 98–111)
Creatinine, Ser: 3.06 mg/dL — ABNORMAL HIGH (ref 0.44–1.00)
GFR, Estimated: 16 mL/min — ABNORMAL LOW (ref >60)
Glucose, Bld: 111 mg/dL — ABNORMAL HIGH (ref 70–99)
Potassium: 3.3 mmol/L — ABNORMAL LOW (ref 3.5–5.1)
Sodium: 135 mmol/L (ref 135–145)
Total Bilirubin: 0.9 mg/dL (ref 0.3–1.2)
Total Protein: 7.1 g/dL (ref 6.5–8.1)

## 2021-05-27 LAB — CBC
HCT: 34.9 % — ABNORMAL LOW (ref 36.0–46.0)
Hemoglobin: 10.8 g/dL — ABNORMAL LOW (ref 12.0–15.0)
MCH: 27.9 pg (ref 26.0–34.0)
MCHC: 30.9 g/dL (ref 30.0–36.0)
MCV: 90.2 fL (ref 80.0–100.0)
Platelets: 294 10*3/uL (ref 150–400)
RBC: 3.87 MIL/uL (ref 3.87–5.11)
RDW: 15.3 % (ref 11.5–15.5)
WBC: 8.6 10*3/uL (ref 4.0–10.5)
nRBC: 0 % (ref 0.0–0.2)

## 2021-05-27 LAB — SURGICAL PCR SCREEN
MRSA, PCR: NEGATIVE
Staphylococcus aureus: NEGATIVE

## 2021-05-27 LAB — HEMOGLOBIN A1C
Hgb A1c MFr Bld: 6.2 % — ABNORMAL HIGH (ref 4.8–5.6)
Mean Plasma Glucose: 131.24 mg/dL

## 2021-05-27 LAB — TYPE AND SCREEN
ABO/RH(D): B POS
Antibody Screen: NEGATIVE

## 2021-05-27 LAB — GLUCOSE, CAPILLARY: Glucose-Capillary: 110 mg/dL — ABNORMAL HIGH (ref 70–99)

## 2021-05-27 NOTE — Progress Notes (Signed)
PCP - Marton Redwood, MD Cardiologist - Denies  PPM/ICD - Denies  Chest x-ray - N/A EKG - 05/27/21 Stress Test - 2004 ECHO - Denies Cardiac Cath - Denies  Sleep Study - Yes, per pt she was negative CPAP - No  Fasting Blood Sugar - 90s Checks Blood Sugar x1 weekly  Blood Thinner Instructions: N/A Aspirin Instructions:N/A  ERAS Protcol - No PRE-SURGERY Ensure or G2- N/A  COVID TEST- Pt instructed to go to [facility] 06/02/21; pt given map and Lab requisition form-- instructed to bring form for covid testing   Anesthesia review: Yes, cardiac hx; hx murmur  Patient denies shortness of breath, fever, cough and chest pain at PAT appointment   All instructions explained to the patient, with a verbal understanding of the material. Patient agrees to go over the instructions while at home for a better understanding.  The opportunity to ask questions was provided.

## 2021-05-28 NOTE — Progress Notes (Addendum)
Anesthesia Chart Review:  Case: [id number] Date/Time: 06/05/21 0715   Procedures:      OCCIPITAL- C2 INSTRUMENTATION AND FUSION; POSS EXTENSION TO E3-6     APPLICATION OF INTRAOPERATIVE CT SCAN   Anesthesia type: General   Pre-op diagnosis: DISPLACED FRACTURE OF FIRST CERVICAL VERTEBRA   Location: [place] OR ROOM 62 / [facility]   Surgeons: Dawley, Theodoro Doing, DO       DISCUSSION: Patient is a 75 year old female scheduled for the above procedure. She had a mechanical fall at home on 09/14/20.  Afterwards she noticed some ataxia with gait favoring the left side.  She was also having neck pain and posterior headache.  She presented to ED on 09/15/20 with imaging showing Jefferson type C1 fracctures.  Neurosurgery was consulted.  She was discharged home with Aspen collar and outpatient follow-up. 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   Other history includes former smoker, HTN, borderline DM, asthma, anxiety, MVP (denied prior echo), hypothyroidism, CKD (stage IV; ARF 02/2018 with Cr > 6 in setting of poor intake, diuretic & ARB use, possible UTI), GERD, hiatal hernia (s/p repair), hepatitis C (s/p Harvoni 2015), anemia, migraines, anxiety with panic attacks, back surgery (L4-S1 fusion 08/26/10; L3-4 laminectomy 03/04/12; revision T10-pelvis 07/26/17). BMI is consistent with obesity.   Preoperative labs show a Creatinine of 3.08, BUN 56. Known CKD stage IV that is followed by Dr. Marval Regal, requested requested. Per 01/23/21 labs through [facility], Creatinine 2.4, eGFR (AA) 25.3.   PCP Dr. Brigitte Pulse cleared patient for surgery at "Moderate Risk."  He gave permission to hold aspirin for 5 days prior to surgery.  Awaiting nephrology records to evaluate for stability of renal function, but current results still consistent with CKD stage IV, albeit both Creatinine and BUN are higher since April 2022.  I notified Nikki at Dr. Rubbie Battiest office  regarding Creatinine of 3.06 and also of incidental finding of up to 94% proximal LICA stenosis on 76/5/46 MRA done in the ED just after her fall and cervical fracture. Lexine Baton will have him review for any additional preoperative recommendations. Would recommend he follow renal function closely post-operatively. I also routed the 09/16/20 MRA report to Dr. Brigitte Pulse for future follow-up purposes as well.   She is for presurgical COVID-19 testing on 06/02/2021.   ADDENDUM 05/29/21 12:58 PM: [facility] records received. Although labs from 04/21/21 were received, staff indicated that the last office note available to send is from 07/13/19. On 04/21/21 her BUN was 20, Creatinine 2.24, eGFR 23. 07/13/19 note indicates BUN 47 and Creatinine 2.46 at that visit with her Creatinine primarily fluctuating between ~ 2-2.7 since around 07/2016 with with peak Cr of 6.19 in 02/2018 as mentioned above. She also has issues with overactive bladder and has had recurrent UTIs.   I called and spoke with Ms. [name]. She reported MVP history was from about 50 years ago. She indicated that she did have an echo then. She has not had any issues, and has not been told recently that she has a murmur.  I did advise that her 09/16/20 neck MRA showed plaque on the left and recommended she discuss with Dr. Brigitte Pulse timing of follow-up, as she may need a carotid US within the a year. In regards to her CKD, she confirmed she was seeing Dr. Marval Regal, so unclear why last office note received was from 2020.  She did say that sometimes she gets  combined renal and liver labs through PCP, nephrology, and hepatology. She denied any recent UTI symptoms. She was changed from Lasix 20 mg daily to Torsemide 20 mg daily within the last three months due to edema, which has helped.  She his discouraged about not really being able to do much activity since her cervical fracture and fills this has exacerbated her back pain. She occasionally is taking oxycodone  for pain, but does not sound excessive. She had also started [employer] (contains C8 MCT oil powder, caprylic acid), so she will hold that until after surgery and until she clears with nephrology. Her phentermine is on hold.  I discussed with her that I would be reaching out to Dr. Marval Regal, or Dr. Brigitte Pulse if Dr. Marval Regal was unavailable to review her labs for additional pre-operative recommendations per my discussion with anesthesiologist Oren Bracket, MD. Chart will be left for follow-up nephrology and/or IM input.   ADDENDUM 05/29/21 2:11 PM: I spoke with Dr. Marval Regal. He reviewed labs. He recommended she hold Torsemide on 06/04/21-06/06/21 and plan to recheck STAT BMET on the morning of surgery. There office will contact her about a follow-up appointment within a few weeks after surgery. I have notified patient and Lexine Baton at [facility] office of nephrology recommendations. Nikki said Dr. Reatha Armour has reviewed labs and MRA with plans to proceed as scheduled given nature of case. Patient continues to wear a c-collar for now. Hospitalist and/or nephrology can be consulted post-operatively as indicated. I also left a voice message with Claiborne Billings (Dr. Raul Del assistant) regarding details from my conversation with Dr. Marval Regal and that I had forwarded 09/2020 MRI for future follow-up recommendations. My number left if any questions, otherwise they can contact patient with any additional recommendations and follow-up.   STAT BMET on arrival for surgery. Anesthesia team to review results and evaluate on the day of surgery.    VS: BP 132/60   Pulse 84   Temp 36.8 C (Oral)   Resp 18   Ht 5' 4"  (1.626 m)   Wt 102.1 kg   SpO2 99%   BMI 38.64 kg/m    PROVIDERS: Ginger Organ., MD is PCP. Last evaluation 01/30/21.  Roosevelt Locks, NP is hepatology provider ([facility] Everywhere)  Donato Heinz, MD is nephrologist   LABS: Preoperative labs noted. See DISCUSSION.  (all labs ordered  are listed, but only abnormal results are displayed)  Labs Reviewed  GLUCOSE, CAPILLARY - Abnormal; Notable for the following components:      Result Value   Glucose-Capillary 110 (*)    All other components within normal limits  HEMOGLOBIN A1C - Abnormal; Notable for the following components:   Hgb A1c MFr Bld 6.2 (*)    All other components within normal limits  COMPREHENSIVE METABOLIC PANEL - Abnormal; Notable for the following components:   Potassium 3.3 (*)    Chloride 93 (*)    Glucose, Bld 111 (*)    BUN 56 (*)    Creatinine, Ser 3.06 (*)    GFR, Estimated 16 (*)    All other components within normal limits  CBC - Abnormal; Notable for the following components:   Hemoglobin 10.8 (*)    HCT 34.9 (*)    All other components within normal limits  SURGICAL PCR SCREEN  TYPE AND SCREEN     IMAGES: [place] Abd (limited) 02/27/21: IMPRESSION: Cirrhotic liver morphology without focal hepatic lesion.  CT C-spine 02/27/21 ([facility]): IMPRESSION:  1.  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 There is subluxation of the lateral masses 6 mm on the right and 3 mm on the left.The atlantoaxial dental interval is widened measuring 5 mm AP reflecting transverse atlantal ligament injury  2.  Grade 1 anterolisthesis C3-4 and C4-5.  3.  Severe degenerative disc disease C5 C7   MRI Brain/C-spine 11/04/20 with additional axial images 11/23/20: IMPRESSION: 1. No acute intracranial abnormality. Stable since 2018 and largely unremarkable for age noncontrast MRI appearance of the brain. 2. Conspicuous ligamentous/soft tissue hypertrophy about the odontoid since 2018 is likely the sequelae of December C1 ring fracture. Mild associated new stenosis at the cervicomedullary junction. Underlying chronic cervical spine spondylolisthesis also appears progressed since 2018. CONCLUSION: No convincing signal abnormality in the brainstem or upper cervical spine following C1  fracture. Mild new mass effect/stenosis at the cervicomedullary junction has developed due to what appears to be posttraumatic ligamentous and/or soft tissue hypertrophy about the odontoid. See also Brain MRI from the same day (reported separately).   CT T-spine 11/04/20: IMPRESSION: - Negative for fracture - Pedicle screw fusion from T10 into the lumbar spine. Progressive disc degeneration T7-8 and T8-9. Negative for thoracic stenosis.  CT L-spine 11/04/20: IMPRESSION: - Negative for lumbar fracture - Solid interbody fusion L1-2, L3-4, L4-5, L5-S1. No significant stenosis. Pedicle screw fusion extending from T10 through L5.   MRA Neck 09/16/20: IMPRESSION: 1. No MRA evidence for acute traumatic injury to the major arterial vasculature of the neck. 2. 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 3. Mild 35% atheromatous narrowing at the origin of the right ICA. 4. Wide patency of the vertebral arteries within the neck. Left vertebral artery dominant, with the right vertebral artery terminating in PICA.   EKG: 05/27/21: Sinus rhythm with 1st degree A-V block Normal ECG Confirmed by Ida Rogue [id number]) on 05/27/2021 6:37:29 PM   CV: Normal nuclear stress test, EF 67% 07/31/03.   Past Medical History:  Diagnosis Date   Anemia    Anxiety    panic attacks   Arthritis    Asthma    Blood transfusion    1973--with twins birth   Bronchitis    Chronic kidney disease    Diabetes mellitus    borderline   GERD (gastroesophageal reflux disease)    H/O hiatal hernia    surgery fixed   Headache    migraines   Heart murmur    MVP   Hepatitis    Hep C   Hypertension    Hypothyroidism    Pneumonia     Past Surgical History:  Procedure Laterality Date   ABDOMINAL HYSTERECTOMY     APPENDECTOMY     BACK SURGERY     bladder tack     BREAST CYST EXCISION     BREAST CYST INCISION AND DRAINAGE     BREAST  SURGERY     BUNIONECTOMY     CHOLECYSTECTOMY     DILATION AND CURETTAGE OF UTERUS     ESOPHAGOGASTRODUODENOSCOPY (EGD) WITH PROPOFOL N/A 01/03/2019   Procedure: ESOPHAGOGASTRODUODENOSCOPY (EGD) WITH PROPOFOL;  Surgeon: Otis Brace, MD;  Location: [facility];  Service: Gastroenterology;  Laterality: N/A;   FOREIGN BODY REMOVAL  01/03/2019   Procedure: FOREIGN BODY REMOVAL;  Surgeon: Otis Brace, MD;  Location: [facility];  Service: Gastroenterology;;   FRACTURE SURGERY     left wrist   HERNIA REPAIR     hiatal hernia  LUMBAR FUSION  07/26/2017   LUMBAR LAMINECTOMY  03/04/2012   Procedure: MICRODISCECTOMY LUMBAR LAMINECTOMY;  Surgeon: Jessy Oto, MD;  Location: [place];  Service: Orthopedics;  Laterality: N/A;  L3-4 central laminectomy   NASAL SEPTUM SURGERY     nissan fundoplication     OVARIAN CYST SURGERY     THORACIC FUSION  07/26/2017   TONSILLECTOMY      MEDICATIONS:  ACCU-CHEK FASTCLIX LANCETS MISC   acidophilus (RISAQUAD) CAPS capsule   albuterol (PROVENTIL HFA;VENTOLIN HFA) 108 (90 BASE) MCG/ACT inhaler   allopurinol (ZYLOPRIM) 100 MG tablet   ALPRAZolam (XANAX) 0.5 MG tablet   bismuth subsalicylate (PEPTO BISMOL) 262 MG/15ML suspension   Cholecalciferol 5000 units TABS   clotrimazole-betamethasone (LOTRISONE) cream   Coenzyme Q10 (CO Q-10) 200 MG CAPS   Cyanocobalamin (B-12) 2500 MCG SUBL   EPINEPHrine (EPIPEN 2-PAK) 0.3 mg/0.3 mL IJ SOAJ injection   fluticasone (FLONASE) 50 MCG/ACT nasal spray   fluticasone furoate-vilanterol (BREO ELLIPTA) 100-25 MCG/INH AEPB   furosemide (LASIX) 20 MG tablet   gabapentin (NEURONTIN) 300 MG capsule   hydrALAZINE (APRESOLINE) 25 MG tablet   HYDROcodone-acetaminophen (NORCO/VICODIN) 5-325 MG tablet   hydrOXYzine (ATARAX/VISTARIL) 50 MG tablet   levothyroxine (SYNTHROID) 100 MCG tablet   lidocaine (XYLOCAINE) 2 % solution   loratadine (CLARITIN) 10 MG tablet   methylPREDNISolone (MEDROL DOSEPAK) 4 MG TBPK tablet    metoprolol (TOPROL-XL) 200 MG 24 hr tablet   mirabegron ER (MYRBETRIQ) 50 MG TB24 tablet   Multiple Vitamin (MULITIVITAMIN WITH MINERALS) TABS   ondansetron (ZOFRAN) 4 MG tablet   oxyCODONE-acetaminophen (PERCOCET) 10-325 MG tablet   pantoprazole (PROTONIX) 40 MG tablet   phentermine 37.5 MG capsule   Polyethyl Glycol-Propyl Glycol (SYSTANE) 0.4-0.3 % GEL ophthalmic gel   Propylene Glycol (SYSTANE COMPLETE) 0.6 % SOLN   rOPINIRole (REQUIP) 0.5 MG tablet   rosuvastatin (CRESTOR) 10 MG tablet   SUMAtriptan (IMITREX) 100 MG tablet   tiZANidine (ZANAFLEX) 2 MG tablet   tiZANidine (ZANAFLEX) 2 MG tablet   tiZANidine (ZANAFLEX) 4 MG tablet   torsemide (DEMADEX) 20 MG tablet   TRULICITY 1.5 EL/8.5TM SOPN   Vibegron (GEMTESA) 75 MG TABS   zolpidem (AMBIEN) 10 MG tablet   No current facility-administered medications for this encounter.    Myra Gianotti, PA-C Surgical Short Stay/Anesthesiology [facility] Phone [phone] [facility] Phone [phone] 05/28/2021 4:41 PM

## 2021-05-29 NOTE — Anesthesia Preprocedure Evaluation (Addendum)
Anesthesia Evaluation  Patient identified by MRN, date of birth, ID band Patient awake    Reviewed: Allergy & Precautions, NPO status , Patient's Chart, lab work & pertinent test results  Airway Mallampati: III  TM Distance: >3 FB     Dental  (+) Dental Advisory Given   Pulmonary asthma , former smoker,    breath sounds clear to auscultation       Cardiovascular hypertension, Pt. on medications and Pt. on home beta blockers  Rhythm:Regular Rate:Normal     Neuro/Psych  Headaches,  Neuromuscular disease    GI/Hepatic hiatal hernia, GERD  ,(+) Hepatitis -, C  Endo/Other  diabetesHypothyroidism   Renal/GU CRFRenal disease     Musculoskeletal  (+) Arthritis ,   Abdominal   Peds  Hematology  (+) anemia ,   Anesthesia Other Findings   Reproductive/Obstetrics                            Lab Results  Component Value Date   WBC 8.6 05/27/2021   HGB 10.8 (L) 05/27/2021   HCT 34.9 (L) 05/27/2021   MCV 90.2 05/27/2021   PLT 294 05/27/2021   Lab Results  Component Value Date   CREATININE 3.06 (H) 05/27/2021   BUN 56 (H) 05/27/2021   NA 135 05/27/2021   K 3.3 (L) 05/27/2021   CL 93 (L) 05/27/2021   CO2 31 05/27/2021    Anesthesia Physical Anesthesia Plan  ASA: 3  Anesthesia Plan: General   Post-op Pain Management:    Induction: Intravenous  PONV Risk Score and Plan: 3 and Dexamethasone, Ondansetron and Treatment may vary due to age or medical condition  Airway Management Planned: Oral ETT, Video Laryngoscope Planned and Fiberoptic Intubation Planned  Additional Equipment: Arterial line  Intra-op Plan:   Post-operative Plan: Extubation in OR and Possible Post-op intubation/ventilation  Informed Consent: I have reviewed the patients History and Physical, chart, labs and discussed the procedure including the risks, benefits and alternatives for the proposed anesthesia with the  patient or authorized representative who has indicated his/her understanding and acceptance.     Dental advisory given  Plan Discussed with: CRNA  Anesthesia Plan Comments: ( )       Anesthesia Quick Evaluation

## 2021-06-02 ENCOUNTER — Other Ambulatory Visit: Payer: Self-pay | Admitting: Neurological Surgery

## 2021-06-03 LAB — SARS CORONAVIRUS 2 (TAT 6-24 HRS): SARS Coronavirus 2: NEGATIVE

## 2021-06-05 ENCOUNTER — Inpatient Hospital Stay (HOSPITAL_COMMUNITY): Payer: Medicare Other | Admitting: Physician Assistant

## 2021-06-05 ENCOUNTER — Inpatient Hospital Stay (HOSPITAL_COMMUNITY): Payer: Medicare Other

## 2021-06-05 ENCOUNTER — Encounter (HOSPITAL_COMMUNITY): Payer: Self-pay | Admitting: Neurological Surgery

## 2021-06-05 ENCOUNTER — Inpatient Hospital Stay (HOSPITAL_COMMUNITY): Payer: Medicare Other | Admitting: Vascular Surgery

## 2021-06-05 ENCOUNTER — Inpatient Hospital Stay (HOSPITAL_COMMUNITY)
Admission: RE | Admit: 2021-06-05 | Discharge: 2021-06-09 | DRG: 473 | Disposition: A | Discharge status: Rehabilitation Facility | Payer: Medicare Other | Attending: Neurological Surgery | Admitting: Neurological Surgery

## 2021-06-05 ENCOUNTER — Encounter (HOSPITAL_COMMUNITY)
Admission: RE | Disposition: A | Discharge status: Rehabilitation Facility | Payer: Self-pay | Source: Home / Self Care | Attending: Neurological Surgery

## 2021-06-05 DIAGNOSIS — Z7951 Long term (current) use of inhaled steroids: Secondary | ICD-10-CM | POA: Diagnosis not present

## 2021-06-05 DIAGNOSIS — Z888 Allergy status to other drugs, medicaments and biological substances status: Secondary | ICD-10-CM

## 2021-06-05 DIAGNOSIS — E039 Hypothyroidism, unspecified: Secondary | ICD-10-CM | POA: Diagnosis present

## 2021-06-05 DIAGNOSIS — Z90710 Acquired absence of both cervix and uterus: Secondary | ICD-10-CM

## 2021-06-05 DIAGNOSIS — M532X1 Spinal instabilities, occipito-atlanto-axial region: Secondary | ICD-10-CM | POA: Diagnosis not present

## 2021-06-05 DIAGNOSIS — Z419 Encounter for procedure for purposes other than remedying health state, unspecified: Secondary | ICD-10-CM

## 2021-06-05 DIAGNOSIS — B965 Pseudomonas (aeruginosa) (mallei) (pseudomallei) as the cause of diseases classified elsewhere: Secondary | ICD-10-CM | POA: Diagnosis present

## 2021-06-05 DIAGNOSIS — Z79899 Other long term (current) drug therapy: Secondary | ICD-10-CM | POA: Diagnosis not present

## 2021-06-05 DIAGNOSIS — W109XXA Fall (on) (from) unspecified stairs and steps, initial encounter: Secondary | ICD-10-CM | POA: Diagnosis present

## 2021-06-05 DIAGNOSIS — M4322 Fusion of spine, cervical region: Secondary | ICD-10-CM | POA: Diagnosis not present

## 2021-06-05 DIAGNOSIS — M532X2 Spinal instabilities, cervical region: Secondary | ICD-10-CM | POA: Diagnosis present

## 2021-06-05 DIAGNOSIS — Z9049 Acquired absence of other specified parts of digestive tract: Secondary | ICD-10-CM

## 2021-06-05 DIAGNOSIS — K219 Gastro-esophageal reflux disease without esophagitis: Secondary | ICD-10-CM | POA: Diagnosis present

## 2021-06-05 DIAGNOSIS — Z885 Allergy status to narcotic agent status: Secondary | ICD-10-CM | POA: Diagnosis not present

## 2021-06-05 DIAGNOSIS — S12001S Unspecified nondisplaced fracture of first cervical vertebra, sequela: Secondary | ICD-10-CM | POA: Diagnosis not present

## 2021-06-05 DIAGNOSIS — D72828 Other elevated white blood cell count: Secondary | ICD-10-CM | POA: Diagnosis not present

## 2021-06-05 DIAGNOSIS — Z841 Family history of disorders of kidney and ureter: Secondary | ICD-10-CM | POA: Diagnosis not present

## 2021-06-05 DIAGNOSIS — G43709 Chronic migraine without aura, not intractable, without status migrainosus: Secondary | ICD-10-CM | POA: Diagnosis not present

## 2021-06-05 DIAGNOSIS — Z79890 Hormone replacement therapy (postmenopausal): Secondary | ICD-10-CM

## 2021-06-05 DIAGNOSIS — Z981 Arthrodesis status: Secondary | ICD-10-CM | POA: Diagnosis not present

## 2021-06-05 DIAGNOSIS — E1122 Type 2 diabetes mellitus with diabetic chronic kidney disease: Secondary | ICD-10-CM | POA: Diagnosis present

## 2021-06-05 DIAGNOSIS — J45909 Unspecified asthma, uncomplicated: Secondary | ICD-10-CM | POA: Diagnosis present

## 2021-06-05 DIAGNOSIS — F419 Anxiety disorder, unspecified: Secondary | ICD-10-CM | POA: Diagnosis present

## 2021-06-05 DIAGNOSIS — Z880 Allergy status to penicillin: Secondary | ICD-10-CM | POA: Diagnosis not present

## 2021-06-05 DIAGNOSIS — N319 Neuromuscular dysfunction of bladder, unspecified: Secondary | ICD-10-CM | POA: Diagnosis present

## 2021-06-05 DIAGNOSIS — I129 Hypertensive chronic kidney disease with stage 1 through stage 4 chronic kidney disease, or unspecified chronic kidney disease: Secondary | ICD-10-CM | POA: Diagnosis present

## 2021-06-05 DIAGNOSIS — Z881 Allergy status to other antibiotic agents status: Secondary | ICD-10-CM | POA: Diagnosis not present

## 2021-06-05 DIAGNOSIS — F410 Panic disorder [episodic paroxysmal anxiety] without agoraphobia: Secondary | ICD-10-CM | POA: Diagnosis present

## 2021-06-05 DIAGNOSIS — B1920 Unspecified viral hepatitis C without hepatic coma: Secondary | ICD-10-CM | POA: Diagnosis not present

## 2021-06-05 DIAGNOSIS — S12090K Other displaced fracture of first cervical vertebra, subsequent encounter for fracture with nonunion: Secondary | ICD-10-CM | POA: Diagnosis not present

## 2021-06-05 DIAGNOSIS — Z91030 Bee allergy status: Secondary | ICD-10-CM

## 2021-06-05 DIAGNOSIS — E876 Hypokalemia: Secondary | ICD-10-CM | POA: Diagnosis present

## 2021-06-05 DIAGNOSIS — N189 Chronic kidney disease, unspecified: Secondary | ICD-10-CM | POA: Diagnosis present

## 2021-06-05 DIAGNOSIS — N390 Urinary tract infection, site not specified: Secondary | ICD-10-CM | POA: Diagnosis present

## 2021-06-05 DIAGNOSIS — K7460 Unspecified cirrhosis of liver: Secondary | ICD-10-CM | POA: Diagnosis present

## 2021-06-05 DIAGNOSIS — K592 Neurogenic bowel, not elsewhere classified: Secondary | ICD-10-CM | POA: Diagnosis not present

## 2021-06-05 DIAGNOSIS — S12030K Displaced posterior arch fracture of first cervical vertebra, subsequent encounter for fracture with nonunion: Secondary | ICD-10-CM | POA: Diagnosis not present

## 2021-06-05 DIAGNOSIS — S12001D Unspecified nondisplaced fracture of first cervical vertebra, subsequent encounter for fracture with routine healing: Secondary | ICD-10-CM | POA: Diagnosis not present

## 2021-06-05 DIAGNOSIS — Z4789 Encounter for other orthopedic aftercare: Secondary | ICD-10-CM | POA: Diagnosis not present

## 2021-06-05 DIAGNOSIS — D72829 Elevated white blood cell count, unspecified: Secondary | ICD-10-CM | POA: Diagnosis not present

## 2021-06-05 DIAGNOSIS — S12000A Unspecified displaced fracture of first cervical vertebra, initial encounter for closed fracture: Principal | ICD-10-CM | POA: Diagnosis present

## 2021-06-05 DIAGNOSIS — Z87891 Personal history of nicotine dependence: Secondary | ICD-10-CM

## 2021-06-05 DIAGNOSIS — I9589 Other hypotension: Secondary | ICD-10-CM | POA: Diagnosis not present

## 2021-06-05 HISTORY — PX: APPLICATION OF INTRAOPERATIVE CT SCAN: SHX6668

## 2021-06-05 HISTORY — PX: POSTERIOR CERVICAL FUSION/FORAMINOTOMY: SHX5038

## 2021-06-05 LAB — BASIC METABOLIC PANEL
Anion gap: 11 (ref 5–15)
BUN: 41 mg/dL — ABNORMAL HIGH (ref 8–23)
CO2: 29 mmol/L (ref 22–32)
Calcium: 9.5 mg/dL (ref 8.9–10.3)
Chloride: 96 mmol/L — ABNORMAL LOW (ref 98–111)
Creatinine, Ser: 3.14 mg/dL — ABNORMAL HIGH (ref 0.44–1.00)
GFR, Estimated: 15 mL/min — ABNORMAL LOW (ref >60)
Glucose, Bld: 101 mg/dL — ABNORMAL HIGH (ref 70–99)
Potassium: 3.2 mmol/L — ABNORMAL LOW (ref 3.5–5.1)
Sodium: 136 mmol/L (ref 135–145)

## 2021-06-05 LAB — GLUCOSE, CAPILLARY
Glucose-Capillary: 96 mg/dL (ref 70–99)
Glucose-Capillary: 125 mg/dL — ABNORMAL HIGH (ref 70–99)

## 2021-06-05 SURGERY — POSTERIOR CERVICAL FUSION/FORAMINOTOMY LEVEL 2
Anesthesia: General | Site: Spine Cervical

## 2021-06-05 MED ORDER — ONDANSETRON HCL 4 MG/2ML IJ SOLN
INTRAMUSCULAR | Status: DC | PRN
Start: 2021-06-05 — End: 2021-06-05
  Administered 2021-06-05: 4 mg via INTRAVENOUS

## 2021-06-05 MED ORDER — DULAGLUTIDE 1.5 MG/0.5ML ~~LOC~~ SOAJ: 1.5000 mg | SUBCUTANEOUS | Status: DC

## 2021-06-05 MED ORDER — VANCOMYCIN HCL 1000 MG IV SOLR
INTRAVENOUS | Status: AC
  Filled 2021-06-05: qty 20

## 2021-06-05 MED ORDER — SODIUM CHLORIDE 0.9% FLUSH
3.0000 mL | INTRAVENOUS | Status: DC | PRN
  Administered 2021-06-05: 3 mL via INTRAVENOUS

## 2021-06-05 MED ORDER — THROMBIN 5000 UNITS EX SOLR
OROMUCOSAL | Status: DC | PRN
  Administered 2021-06-05: 5 mL via TOPICAL

## 2021-06-05 MED ORDER — BUPIVACAINE-EPINEPHRINE (PF) 0.5% -1:200000 IJ SOLN
INTRAMUSCULAR | Status: DC | PRN
  Administered 2021-06-05: 10 mL

## 2021-06-05 MED ORDER — MIDAZOLAM HCL 5 MG/5ML IJ SOLN
INTRAMUSCULAR | Status: DC | PRN
Start: 2021-06-05 — End: 2021-06-05
  Administered 2021-06-05: 2 mg via INTRAVENOUS

## 2021-06-05 MED ORDER — MENTHOL 3 MG MT LOZG: 1.0000 | LOZENGE | OROMUCOSAL | Status: DC | PRN

## 2021-06-05 MED ORDER — ROCURONIUM BROMIDE 10 MG/ML (PF) SYRINGE
PREFILLED_SYRINGE | INTRAVENOUS | Status: DC | PRN
Start: 2021-06-05 — End: 2021-06-05
  Administered 2021-06-05 (×2): 30 mg via INTRAVENOUS
  Administered 2021-06-05 (×2): 50 mg via INTRAVENOUS

## 2021-06-05 MED ORDER — ALPRAZOLAM 0.5 MG PO TABS
0.5000 mg | ORAL_TABLET | Freq: Two times a day (BID) | ORAL | Status: DC
  Administered 2021-06-05 – 2021-06-09 (×9): 0.5 mg via ORAL
  Filled 2021-06-05 (×9): qty 1

## 2021-06-05 MED ORDER — SODIUM CHLORIDE 0.9 % IV SOLN
250.0000 mL | INTRAVENOUS | Status: DC
  Administered 2021-06-05: 250 mL via INTRAVENOUS

## 2021-06-05 MED ORDER — HYDROXYZINE HCL 25 MG PO TABS
50.0000 mg | ORAL_TABLET | Freq: Three times a day (TID) | ORAL | Status: DC | PRN
  Administered 2021-06-05 – 2021-06-08 (×8): 50 mg via ORAL
  Filled 2021-06-05 (×8): qty 2

## 2021-06-05 MED ORDER — GABAPENTIN 300 MG PO CAPS
300.0000 mg | ORAL_CAPSULE | Freq: Two times a day (BID) | ORAL | Status: DC
  Administered 2021-06-05 – 2021-06-09 (×8): 300 mg via ORAL
  Filled 2021-06-05 (×8): qty 1

## 2021-06-05 MED ORDER — LIDOCAINE 2% (20 MG/ML) 5 ML SYRINGE
INTRAMUSCULAR | Status: AC
  Filled 2021-06-05: qty 5

## 2021-06-05 MED ORDER — ROSUVASTATIN CALCIUM 5 MG PO TABS
10.0000 mg | ORAL_TABLET | Freq: Every day | ORAL | Status: DC
  Administered 2021-06-06 – 2021-06-09 (×4): 10 mg via ORAL
  Filled 2021-06-05 (×4): qty 2

## 2021-06-05 MED ORDER — BUPIVACAINE LIPOSOME 1.3 % IJ SUSP
INTRAMUSCULAR | Status: AC
  Filled 2021-06-05: qty 20

## 2021-06-05 MED ORDER — BACITRACIN ZINC 500 UNIT/GM EX OINT
TOPICAL_OINTMENT | CUTANEOUS | Status: AC
  Filled 2021-06-05: qty 28.35

## 2021-06-05 MED ORDER — MIRABEGRON ER 25 MG PO TB24
50.0000 mg | ORAL_TABLET | Freq: Every day | ORAL | Status: DC
  Administered 2021-06-06 – 2021-06-09 (×4): 50 mg via ORAL
  Filled 2021-06-05 (×4): qty 2

## 2021-06-05 MED ORDER — THROMBIN 5000 UNITS EX SOLR
CUTANEOUS | Status: AC
  Filled 2021-06-05: qty 5000

## 2021-06-05 MED ORDER — FLUTICASONE FUROATE-VILANTEROL 100-25 MCG/INH IN AEPB
1.0000 | INHALATION_SPRAY | Freq: Every day | RESPIRATORY_TRACT | Status: DC
  Administered 2021-06-05 – 2021-06-08 (×4): 1 via RESPIRATORY_TRACT
  Filled 2021-06-05: qty 28

## 2021-06-05 MED ORDER — EPINEPHRINE 0.3 MG/0.3ML IJ SOAJ: 0.3000 mg | Freq: Every day | INTRAMUSCULAR | Status: DC | PRN

## 2021-06-05 MED ORDER — OXYCODONE HCL 5 MG PO TABS: 5.0000 mg | ORAL_TABLET | ORAL | Status: DC | PRN

## 2021-06-05 MED ORDER — PHENYLEPHRINE 40 MCG/ML (10ML) SYRINGE FOR IV PUSH (FOR BLOOD PRESSURE SUPPORT)
PREFILLED_SYRINGE | INTRAVENOUS | Status: DC | PRN
  Administered 2021-06-05 (×10): 80 ug via INTRAVENOUS

## 2021-06-05 MED ORDER — SUGAMMADEX SODIUM 200 MG/2ML IV SOLN
INTRAVENOUS | Status: DC | PRN
  Administered 2021-06-05: 400 mg via INTRAVENOUS

## 2021-06-05 MED ORDER — SENNOSIDES-DOCUSATE SODIUM 8.6-50 MG PO TABS: 1.0000 | ORAL_TABLET | Freq: Every evening | ORAL | Status: DC | PRN

## 2021-06-05 MED ORDER — CHLORHEXIDINE GLUCONATE CLOTH 2 % EX PADS: 6.0000 | MEDICATED_PAD | Freq: Once | CUTANEOUS | Status: DC

## 2021-06-05 MED ORDER — HYDROMORPHONE HCL 1 MG/ML IJ SOLN
0.5000 mg | INTRAMUSCULAR | Status: DC | PRN
  Administered 2021-06-05 – 2021-06-06 (×4): 0.5 mg via INTRAVENOUS
  Filled 2021-06-05 (×4): qty 0.5

## 2021-06-05 MED ORDER — CHLORHEXIDINE GLUCONATE 0.12 % MT SOLN
15.0000 mL | Freq: Once | OROMUCOSAL | Status: AC
  Administered 2021-06-05: 15 mL via OROMUCOSAL
  Filled 2021-06-05: qty 15

## 2021-06-05 MED ORDER — BUPIVACAINE LIPOSOME 1.3 % IJ SUSP
INTRAMUSCULAR | Status: DC | PRN
  Administered 2021-06-05: 20 mL

## 2021-06-05 MED ORDER — BACITRACIN ZINC 500 UNIT/GM EX OINT
TOPICAL_OINTMENT | CUTANEOUS | Status: DC | PRN
  Administered 2021-06-05: 1 via TOPICAL

## 2021-06-05 MED ORDER — METHOCARBAMOL 750 MG PO TABS
750.0000 mg | ORAL_TABLET | Freq: Four times a day (QID) | ORAL | Status: DC | PRN
  Administered 2021-06-06 (×2): 750 mg via ORAL
  Filled 2021-06-05 (×3): qty 1

## 2021-06-05 MED ORDER — PROPOFOL 10 MG/ML IV BOLUS
INTRAVENOUS | Status: AC
  Filled 2021-06-05: qty 20

## 2021-06-05 MED ORDER — LACTATED RINGERS IV SOLN
INTRAVENOUS | Status: DC | PRN
Start: 2021-06-05 — End: 2021-06-05

## 2021-06-05 MED ORDER — ORAL CARE MOUTH RINSE: 15.0000 mL | Freq: Once | OROMUCOSAL | Status: AC

## 2021-06-05 MED ORDER — 0.9 % SODIUM CHLORIDE (POUR BTL) OPTIME
TOPICAL | Status: DC | PRN
  Administered 2021-06-05: 1000 mL

## 2021-06-05 MED ORDER — LIDOCAINE-EPINEPHRINE 1 %-1:100000 IJ SOLN
INTRAMUSCULAR | Status: AC
  Filled 2021-06-05: qty 1

## 2021-06-05 MED ORDER — SODIUM CHLORIDE 0.9% FLUSH
3.0000 mL | Freq: Two times a day (BID) | INTRAVENOUS | Status: DC
  Administered 2021-06-05 – 2021-06-08 (×8): 3 mL via INTRAVENOUS

## 2021-06-05 MED ORDER — FENTANYL CITRATE (PF) 100 MCG/2ML IJ SOLN
25.0000 ug | INTRAMUSCULAR | Status: DC | PRN
  Administered 2021-06-05 (×2): 25 ug via INTRAVENOUS

## 2021-06-05 MED ORDER — ACETAMINOPHEN 650 MG RE SUPP: 650.0000 mg | RECTAL | Status: DC | PRN

## 2021-06-05 MED ORDER — FENTANYL CITRATE (PF) 250 MCG/5ML IJ SOLN
INTRAMUSCULAR | Status: DC | PRN
  Administered 2021-06-05 (×2): 50 ug via INTRAVENOUS
  Administered 2021-06-05: 100 ug via INTRAVENOUS
  Administered 2021-06-05: 50 ug via INTRAVENOUS

## 2021-06-05 MED ORDER — ROCURONIUM BROMIDE 10 MG/ML (PF) SYRINGE
PREFILLED_SYRINGE | INTRAVENOUS | Status: AC
  Filled 2021-06-05: qty 20

## 2021-06-05 MED ORDER — ONDANSETRON HCL 4 MG/2ML IJ SOLN: 4.0000 mg | Freq: Four times a day (QID) | INTRAMUSCULAR | Status: DC | PRN

## 2021-06-05 MED ORDER — DEXAMETHASONE SODIUM PHOSPHATE 10 MG/ML IJ SOLN
INTRAMUSCULAR | Status: AC
  Filled 2021-06-05: qty 1

## 2021-06-05 MED ORDER — PANTOPRAZOLE SODIUM 40 MG PO TBEC
40.0000 mg | DELAYED_RELEASE_TABLET | Freq: Every day | ORAL | Status: DC
  Administered 2021-06-06 – 2021-06-09 (×4): 40 mg via ORAL
  Filled 2021-06-05 (×5): qty 1

## 2021-06-05 MED ORDER — ALBUMIN HUMAN 5 % IV SOLN: INTRAVENOUS | Status: DC | PRN

## 2021-06-05 MED ORDER — OXYCODONE HCL 5 MG PO TABS
10.0000 mg | ORAL_TABLET | ORAL | Status: DC | PRN
  Administered 2021-06-05 – 2021-06-06 (×2): 10 mg via ORAL
  Filled 2021-06-05 (×3): qty 2

## 2021-06-05 MED ORDER — METHOCARBAMOL 1000 MG/10ML IJ SOLN
500.0000 mg | Freq: Four times a day (QID) | INTRAVENOUS | Status: DC | PRN
  Filled 2021-06-05: qty 5

## 2021-06-05 MED ORDER — METOPROLOL SUCCINATE ER 100 MG PO TB24
200.0000 mg | ORAL_TABLET | Freq: Every day | ORAL | Status: DC
  Administered 2021-06-06 – 2021-06-08 (×3): 200 mg via ORAL
  Filled 2021-06-05 (×4): qty 2

## 2021-06-05 MED ORDER — CHLORHEXIDINE GLUCONATE CLOTH 2 % EX PADS
6.0000 | MEDICATED_PAD | Freq: Every day | CUTANEOUS | Status: DC
  Administered 2021-06-05 – 2021-06-08 (×3): 6 via TOPICAL

## 2021-06-05 MED ORDER — TORSEMIDE 20 MG PO TABS
20.0000 mg | ORAL_TABLET | Freq: Every day | ORAL | Status: DC
  Administered 2021-06-05 – 2021-06-09 (×5): 20 mg via ORAL
  Filled 2021-06-05 (×6): qty 1

## 2021-06-05 MED ORDER — PROPOFOL 10 MG/ML IV BOLUS
INTRAVENOUS | Status: DC | PRN
  Administered 2021-06-05: 50 mg via INTRAVENOUS
  Administered 2021-06-05: 150 mg via INTRAVENOUS

## 2021-06-05 MED ORDER — PROMETHAZINE HCL 25 MG/ML IJ SOLN: 6.2500 mg | INTRAMUSCULAR | Status: DC | PRN

## 2021-06-05 MED ORDER — FLUTICASONE PROPIONATE 50 MCG/ACT NA SUSP
2.0000 | Freq: Every day | NASAL | Status: DC
  Administered 2021-06-05 – 2021-06-09 (×5): 2 via NASAL
  Filled 2021-06-05 (×2): qty 16

## 2021-06-05 MED ORDER — LIDOCAINE 2% (20 MG/ML) 5 ML SYRINGE
INTRAMUSCULAR | Status: DC | PRN
  Administered 2021-06-05: 60 mg via INTRAVENOUS

## 2021-06-05 MED ORDER — LEVOTHYROXINE SODIUM 100 MCG PO TABS
100.0000 ug | ORAL_TABLET | Freq: Every day | ORAL | Status: DC
Start: 2021-06-06 — End: 2021-06-09
  Administered 2021-06-06 – 2021-06-09 (×4): 100 ug via ORAL
  Filled 2021-06-05 (×4): qty 1

## 2021-06-05 MED ORDER — DEXAMETHASONE SODIUM PHOSPHATE 10 MG/ML IJ SOLN
INTRAMUSCULAR | Status: DC | PRN
  Administered 2021-06-05: 10 mg via INTRAVENOUS

## 2021-06-05 MED ORDER — FENTANYL CITRATE (PF) 100 MCG/2ML IJ SOLN
INTRAMUSCULAR | Status: AC
  Filled 2021-06-05: qty 2

## 2021-06-05 MED ORDER — ONDANSETRON HCL 4 MG PO TABS: 4.0000 mg | ORAL_TABLET | Freq: Four times a day (QID) | ORAL | Status: DC | PRN

## 2021-06-05 MED ORDER — THROMBIN 20000 UNITS EX SOLR
CUTANEOUS | Status: DC | PRN
  Administered 2021-06-05: 20 mL via TOPICAL

## 2021-06-05 MED ORDER — ACETAMINOPHEN 325 MG PO TABS
650.0000 mg | ORAL_TABLET | ORAL | Status: DC | PRN
  Administered 2021-06-06 – 2021-06-08 (×3): 650 mg via ORAL
  Filled 2021-06-05 (×3): qty 2

## 2021-06-05 MED ORDER — THROMBIN 20000 UNITS EX SOLR
CUTANEOUS | Status: AC
  Filled 2021-06-05: qty 20000

## 2021-06-05 MED ORDER — SUMATRIPTAN SUCCINATE 25 MG PO TABS
25.0000 mg | ORAL_TABLET | ORAL | Status: DC | PRN
  Filled 2021-06-05: qty 1

## 2021-06-05 MED ORDER — ONDANSETRON HCL 4 MG/2ML IJ SOLN
INTRAMUSCULAR | Status: AC
  Filled 2021-06-05: qty 2

## 2021-06-05 MED ORDER — ACETAMINOPHEN 500 MG PO TABS: 1000.0000 mg | ORAL_TABLET | Freq: Once | ORAL | Status: DC

## 2021-06-05 MED ORDER — MIDAZOLAM HCL 2 MG/2ML IJ SOLN
INTRAMUSCULAR | Status: AC
  Filled 2021-06-05: qty 2

## 2021-06-05 MED ORDER — PHENYLEPHRINE HCL-NACL 20-0.9 MG/250ML-% IV SOLN
INTRAVENOUS | Status: DC | PRN
Start: 2021-06-05 — End: 2021-06-05
  Administered 2021-06-05: 25 ug/min via INTRAVENOUS

## 2021-06-05 MED ORDER — PHENYLEPHRINE 40 MCG/ML (10ML) SYRINGE FOR IV PUSH (FOR BLOOD PRESSURE SUPPORT)
PREFILLED_SYRINGE | INTRAVENOUS | Status: AC
  Filled 2021-06-05: qty 20

## 2021-06-05 MED ORDER — FENTANYL CITRATE (PF) 250 MCG/5ML IJ SOLN
INTRAMUSCULAR | Status: AC
  Filled 2021-06-05: qty 5

## 2021-06-05 MED ORDER — VANCOMYCIN HCL 1 G IV SOLR
INTRAVENOUS | Status: DC | PRN
  Administered 2021-06-05: 1000 mg

## 2021-06-05 MED ORDER — LACTATED RINGERS IV SOLN: INTRAVENOUS | Status: DC

## 2021-06-05 MED ORDER — BUPIVACAINE-EPINEPHRINE 0.5% -1:200000 IJ SOLN
INTRAMUSCULAR | Status: AC
  Filled 2021-06-05: qty 1

## 2021-06-05 MED ORDER — HYDRALAZINE HCL 25 MG PO TABS: 25.0000 mg | ORAL_TABLET | Freq: Three times a day (TID) | ORAL | Status: DC | PRN

## 2021-06-05 MED ORDER — LIDOCAINE-EPINEPHRINE 1 %-1:100000 IJ SOLN
INTRAMUSCULAR | Status: DC | PRN
  Administered 2021-06-05: 10 mL

## 2021-06-05 MED ORDER — VANCOMYCIN HCL 1500 MG/300ML IV SOLN
1500.0000 mg | Freq: Once | INTRAVENOUS | Status: AC
  Administered 2021-06-05: 1500 mg via INTRAVENOUS
  Filled 2021-06-05: qty 300

## 2021-06-05 MED ORDER — VANCOMYCIN HCL IN DEXTROSE 1-5 GM/200ML-% IV SOLN
1000.0000 mg | INTRAVENOUS | Status: DC
  Filled 2021-06-05: qty 200

## 2021-06-05 MED ORDER — PHENOL 1.4 % MT LIQD: 1.0000 | OROMUCOSAL | Status: DC | PRN

## 2021-06-05 MED ORDER — VIBEGRON 75 MG PO TABS: 75.0000 mg | ORAL_TABLET | Freq: Every day | ORAL | Status: DC

## 2021-06-05 MED ORDER — ZOLPIDEM TARTRATE 5 MG PO TABS: 5.0000 mg | ORAL_TABLET | Freq: Every evening | ORAL | Status: DC | PRN

## 2021-06-05 MED ORDER — ESMOLOL HCL 100 MG/10ML IV SOLN
INTRAVENOUS | Status: AC
  Filled 2021-06-05: qty 10

## 2021-06-05 MED ORDER — LORATADINE 10 MG PO TABS
10.0000 mg | ORAL_TABLET | Freq: Every day | ORAL | Status: DC
  Administered 2021-06-06 – 2021-06-09 (×4): 10 mg via ORAL
  Filled 2021-06-05 (×4): qty 1

## 2021-06-05 MED ORDER — PHENYLEPHRINE HCL (PRESSORS) 10 MG/ML IV SOLN
INTRAVENOUS | Status: AC
  Filled 2021-06-05: qty 2

## 2021-06-05 SURGICAL SUPPLY — 87 items
ADH SKN CLS APL DERMABOND .7 (GAUZE/BANDAGES/DRESSINGS) ×1
BAG COUNTER SPONGE SURGICOUNT (BAG) ×2 IMPLANT
BAG SPNG CNTER NS LX DISP (BAG) ×1
BAND INSRT 18 STRL LF DISP RB (MISCELLANEOUS) ×2
BAND RUBBER #18 3X1/16 STRL (MISCELLANEOUS) ×4 IMPLANT
BIT DRILL NEURO 2X3.1 SFT TUCH (MISCELLANEOUS) ×1 IMPLANT
BNDG GAUZE ELAST 4 BULKY (GAUZE/BANDAGES/DRESSINGS) ×1 IMPLANT
BUR MATCHSTICK NEURO 3.0 LAGG (BURR) ×1 IMPLANT
BUR SABER NEURO 2.5 (BURR) IMPLANT
CAP LOCKING (Cap) IMPLANT
CARTRIDGE OIL MAESTRO DRILL (MISCELLANEOUS) ×1 IMPLANT
CNTNR URN SCR LID CUP LEK RST (MISCELLANEOUS) ×1 IMPLANT
CONT SPEC 4OZ STRL OR WHT (MISCELLANEOUS) ×2
COVER BACK TABLE 60X90IN (DRAPES) ×4 IMPLANT
COVER MAYO STAND STRL (DRAPES) ×1 IMPLANT
DECANTER SPIKE VIAL GLASS SM (MISCELLANEOUS) ×2 IMPLANT
DERMABOND ADVANCED (GAUZE/BANDAGES/DRESSINGS) ×1
DERMABOND ADVANCED .7 DNX12 (GAUZE/BANDAGES/DRESSINGS) ×1 IMPLANT
DIFFUSER DRILL AIR PNEUMATIC (MISCELLANEOUS) ×1 IMPLANT
DRAIN JACKSON RD 7FR 3/32 (WOUND CARE) IMPLANT
DRAPE C-ARM 42X72 X-RAY (DRAPES) ×1 IMPLANT
DRAPE LAPAROTOMY 100X72X124 (DRAPES) ×2 IMPLANT
DRAPE MICROSCOPE LEICA (MISCELLANEOUS) ×2 IMPLANT
DRAPE SCAN PATIENT (DRAPES) ×2 IMPLANT
DRAPE SURG 17X23 STRL (DRAPES) ×1 IMPLANT
DRILL NEURO 2X3.1 SOFT TOUCH (MISCELLANEOUS) ×2
DRSG OPSITE POSTOP 4X6 (GAUZE/BANDAGES/DRESSINGS) ×2 IMPLANT
DRSG OPSITE POSTOP 4X8 (GAUZE/BANDAGES/DRESSINGS) ×1 IMPLANT
DURAPREP 26ML APPLICATOR (WOUND CARE) ×3 IMPLANT
ELECT BLADE INSULATED 4IN (ELECTROSURGICAL) ×2
ELECT BLADE INSULATED 6.5IN (ELECTROSURGICAL)
ELECT REM PT RETURN 9FT ADLT (ELECTROSURGICAL) ×2
ELECTRODE BLADE INSULATED 4IN (ELECTROSURGICAL) IMPLANT
ELECTRODE BLDE INSULATED 6.5IN (ELECTROSURGICAL) IMPLANT
ELECTRODE REM PT RTRN 9FT ADLT (ELECTROSURGICAL) ×1 IMPLANT
GAUZE 4X4 16PLY ~~LOC~~+RFID DBL (SPONGE) ×1 IMPLANT
GAUZE SPONGE 4X4 12PLY STRL (GAUZE/BANDAGES/DRESSINGS) ×1 IMPLANT
GLOVE SRG 8 PF TXTR STRL LF DI (GLOVE) ×1 IMPLANT
GLOVE SURG ENC MOIS LTX SZ8 (GLOVE) ×3 IMPLANT
GLOVE SURG LTX SZ8 (GLOVE) ×3 IMPLANT
GLOVE SURG UNDER POLY LF SZ7 (GLOVE) ×3 IMPLANT
GLOVE SURG UNDER POLY LF SZ7.5 (GLOVE) ×5 IMPLANT
GLOVE SURG UNDER POLY LF SZ8 (GLOVE) ×10
GLOVE SURG UNDER POLY LF SZ8.5 (GLOVE) ×3 IMPLANT
GOWN STRL REUS W/ TWL LRG LVL3 (GOWN DISPOSABLE) IMPLANT
GOWN STRL REUS W/ TWL XL LVL3 (GOWN DISPOSABLE) ×1 IMPLANT
GOWN STRL REUS W/TWL 2XL LVL3 (GOWN DISPOSABLE) IMPLANT
GOWN STRL REUS W/TWL LRG LVL3 (GOWN DISPOSABLE)
GOWN STRL REUS W/TWL XL LVL3 (GOWN DISPOSABLE) ×12
GRAFT TRINITY ELITE LGE HUMAN (Tissue) ×1 IMPLANT
HEMOSTAT POWDER KIT SURGIFOAM (HEMOSTASIS) ×2 IMPLANT
KIT BASIN OR (CUSTOM PROCEDURE TRAY) ×2 IMPLANT
KIT TURNOVER KIT B (KITS) ×2 IMPLANT
LOCKING CAP (Cap) ×16 IMPLANT
MARKER SKIN DUAL TIP RULER LAB (MISCELLANEOUS) ×2 IMPLANT
MARKER SPHERE PSV REFLC 13MM (MARKER) ×12 IMPLANT
NDL BLUNT 18X1 FOR OR ONLY (NEEDLE) ×1 IMPLANT
NDL HYPO 25X1 1.5 SAFETY (NEEDLE) ×1 IMPLANT
NDL SPNL 18GX3.5 QUINCKE PK (NEEDLE) ×2 IMPLANT
NEEDLE BLUNT 18X1 FOR OR ONLY (NEEDLE) IMPLANT
NEEDLE HYPO 25X1 1.5 SAFETY (NEEDLE) ×2 IMPLANT
NEEDLE SPNL 18GX3.5 QUINCKE PK (NEEDLE) IMPLANT
NS IRRIG 1000ML POUR BTL (IV SOLUTION) ×2 IMPLANT
OIL CARTRIDGE MAESTRO DRILL (MISCELLANEOUS)
PACK LAMINECTOMY NEURO (CUSTOM PROCEDURE TRAY) ×2 IMPLANT
PAD ARMBOARD 7.5X6 YLW CONV (MISCELLANEOUS) ×6 IMPLANT
PATTIES SURGICAL .5 X1 (DISPOSABLE) IMPLANT
PLATE QUARTEX OCCIPITAL SM (Plate) ×1 IMPLANT
ROD OCCIP JOINT QUARTEX 4 LT (Screw) ×1 IMPLANT
ROD OCCIP JOINT QUARTEX 4 RT (Screw) ×1 IMPLANT
SCREW OCCIP QUARTEX 4.2X10 (Screw) ×1 IMPLANT
SCREW POLYAXIAL QUARTEX 4X12 (Screw) ×1 IMPLANT
SCREW POLYAXIAL QUARTEX 4X14 (Screw) ×3 IMPLANT
SCREW QUARTEX 4.0X24MM POLY (Screw) ×2 IMPLANT
SCREW QUARTEX 4.2X8 (Screw) ×3 IMPLANT
SPONGE SURGIFOAM ABS GEL 100 (HEMOSTASIS) ×2 IMPLANT
SPONGE T-LAP 4X18 ~~LOC~~+RFID (SPONGE) ×2 IMPLANT
STAPLER VISISTAT 35W (STAPLE) ×1 IMPLANT
SUT VIC AB 0 CT1 27 (SUTURE)
SUT VIC AB 0 CT1 27XBRD ANBCTR (SUTURE) ×1 IMPLANT
SUT VIC AB 2-0 CP2 18 (SUTURE) ×3 IMPLANT
SYR 3ML LL SCALE MARK (SYRINGE) ×2 IMPLANT
TOWEL GREEN STERILE (TOWEL DISPOSABLE) ×2 IMPLANT
TOWEL GREEN STERILE FF (TOWEL DISPOSABLE) ×2 IMPLANT
TRAY FOLEY MTR SLVR 14FR STAT (SET/KITS/TRAYS/PACK) ×1 IMPLANT
TRAY FOLEY MTR SLVR 16FR STAT (SET/KITS/TRAYS/PACK) ×1 IMPLANT
WATER STERILE IRR 1000ML POUR (IV SOLUTION) ×2 IMPLANT

## 2021-06-05 NOTE — Anesthesia Procedure Notes (Signed)
Procedure Name: Intubation Date/Time: 06/05/2021 8:27 AM Performed by: Renato Shin, CRNA Pre-anesthesia Checklist: Patient identified, Emergency Drugs available, Suction available and Patient being monitored Patient Re-evaluated:Patient Re-evaluated prior to induction Oxygen Delivery Method: Circle system utilized Preoxygenation: Pre-oxygenation with 100% oxygen Induction Type: IV induction Ventilation: Mask ventilation without difficulty Laryngoscope Size: Glidescope and 3 Grade View: Grade I Tube type: Oral Tube size: 7.5 mm Number of attempts: 1 Airway Equipment and Method: Stylet and Oral airway Placement Confirmation: ETT inserted through vocal cords under direct vision, positive ETCO2 and breath sounds checked- equal and bilateral Secured at: 21 cm Tube secured with: Tape Dental Injury: Teeth and Oropharynx as per pre-operative assessment  Difficulty Due To: Difficulty was anticipated, Difficult Airway- due to cervical collar and Difficult Airway-  due to neck instability Comments: Cspine maintained throughout DL

## 2021-06-05 NOTE — Progress Notes (Signed)
OT Cancellation Note  Patient Details Name: Becky Hobbs MRN: [id number] DOB: 1946-01-14   Cancelled Treatment:    Reason Eval/Treat Not Completed: Pain limiting ability to participate.  OT to continue efforts as appropriate.  Becky Hobbs D Aislyn Hayse 06/05/2021, 5:17 PM 06/05/2021  RP, OTR/L  Acute Rehabilitation Services  Office:  [phone]

## 2021-06-05 NOTE — Anesthesia Postprocedure Evaluation (Signed)
Anesthesia Post Note  Patient: Becky Hobbs  Procedure(s) Performed: OCCIPITAL- CERVICAL TWO INSTRUMENTATION AND FUSION; EXTENSION TO CERVICAL FIVE (Spine Cervical) APPLICATION OF INTRAOPERATIVE CT SCAN (Spine Cervical)     Patient location during evaluation: PACU Anesthesia Type: General Level of consciousness: awake and alert Pain management: pain level controlled Vital Signs Assessment: post-procedure vital signs reviewed and stable Respiratory status: spontaneous breathing, nonlabored ventilation, respiratory function stable and patient connected to nasal cannula oxygen Cardiovascular status: blood pressure returned to baseline and stable Postop Assessment: no apparent nausea or vomiting Anesthetic complications: no   No notable events documented.  Last Vitals:  Vitals:   06/05/21 1346 06/05/21 1450  BP: 131/64 (!) 128/50  Pulse: 81 80  Resp: 15   Temp: 36.6 C 36.6 C  SpO2: 100% 100%    Last Pain:  Vitals:   06/05/21 1514  TempSrc:   PainSc: Asleep                 Tiajuana Amass

## 2021-06-05 NOTE — Progress Notes (Signed)
Pt received on unit. Bed locked in the lowest position with call bell within reach

## 2021-06-05 NOTE — Progress Notes (Signed)
   Providing Compassionate, Quality Care - Together  NEUROSURGERY PROGRESS NOTE   S: pt s/e in pacu  O: EXAM:  BP 123/63   Pulse 79   Temp 97.8 F (36.6 C)   Resp 13   Ht 5\' 4"  (1.626 m)   Wt 102 kg   SpO2 93%   BMI 38.60 kg/m   Sleepy, easily arousable PERRL Cn 2-12 intact Face symmetric Dressing c/d/I Full strength BUE/BLE  ASSESSMENT:  75 y.o. 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  Please do not hesitate to call with questions or concerns.   Elwin Sleight, [facility] Cell: [phone]

## 2021-06-05 NOTE — H&P (Signed)
Providing Compassionate, Quality Care - Together  NEUROSURGERY HISTORY & PHYSICAL   Becky Hobbs is an 75 y.o. female.   Chief Complaint: Neck pain HPI: This is a 75 year old female with a history of a fall downstairs was found to have an anterior and posterior ring C1 fracture.  He was treated treated conservatively with a collar and follow-up with imaging.  On progressive imaging the lateral listhesis on the right of C1 on C2 continue to progress and she has severe occipital headaches especially on the right side as well as right-sided neck pain.  Given her progressive lateral listhesis, anterior and posterior ring fracture, we discussed surgical intervention in the form of occipital cervical fusion.  We discussed all risks benefits and expected outcomes and she agreed to proceed.  She denies any new symptoms such as numbness, tingling or weakness.  Past Medical History:  Diagnosis Date   Anemia    Anxiety    panic attacks   Arthritis    Asthma    Blood transfusion    1973--with twins birth   Bronchitis    Chronic kidney disease    Diabetes mellitus    borderline   GERD (gastroesophageal reflux disease)    H/O hiatal hernia    surgery fixed   Headache    migraines   Heart murmur    MVP   Hepatitis    Hep C   Hypertension    Hypothyroidism    Pneumonia     Past Surgical History:  Procedure Laterality Date   ABDOMINAL HYSTERECTOMY     APPENDECTOMY     BACK SURGERY     bladder tack     BREAST CYST EXCISION     BREAST CYST INCISION AND DRAINAGE     BREAST SURGERY     BUNIONECTOMY     CHOLECYSTECTOMY     DILATION AND CURETTAGE OF UTERUS     ESOPHAGOGASTRODUODENOSCOPY (EGD) WITH PROPOFOL N/A 01/03/2019   Procedure: ESOPHAGOGASTRODUODENOSCOPY (EGD) WITH PROPOFOL;  Surgeon: Otis Brace, MD;  Location: [facility];  Service: Gastroenterology;  Laterality: N/A;   FOREIGN BODY REMOVAL  01/03/2019   Procedure: FOREIGN BODY REMOVAL;  Surgeon: Otis Brace,  MD;  Location: [facility];  Service: Gastroenterology;;   FRACTURE SURGERY     left wrist   HERNIA REPAIR     hiatal hernia   LUMBAR FUSION  07/26/2017   LUMBAR LAMINECTOMY  03/04/2012   Procedure: MICRODISCECTOMY LUMBAR LAMINECTOMY;  Surgeon: Jessy Oto, MD;  Location: [facility];  Service: Orthopedics;  Laterality: N/A;  L3-4 central laminectomy   NASAL SEPTUM SURGERY     nissan fundoplication     OVARIAN CYST SURGERY     THORACIC FUSION  07/26/2017   TONSILLECTOMY      Family History  Problem Relation Age of Onset   Kidney disease Mother    Vascular Disease Mother    Social History:  reports that she has quit smoking. Her smoking use included cigarettes. She has never used smokeless tobacco. She reports that she does not drink alcohol and does not use drugs.  Allergies:  Allergies  Allergen Reactions   Bee Venom Anaphylaxis   Buprenorphine Hcl Hives and Itching   Erythromycin Hives and Itching   Other Anaphylaxis    bees   Penicillins Hives    Has patient had a PCN reaction causing immediate rash, facial/tongue/throat swelling, SOB or lightheadedness with hypotension: No Has patient had a PCN reaction causing severe rash involving mucus  membranes or skin necrosis: No Has patient had a PCN reaction that required hospitalization: No Has patient had a PCN reaction occurring within the last 10 years: No If all of the above answers are "NO", then may proceed with Cephalosporin use.   Hydrocodone Itching   Morphine And Related Hives and Itching    Hives (moderate severity)   Tramadol Hcl Itching   Tramadol Hcl Itching   Hydrocodone-Acetaminophen Itching    Pt says it causes hallucinations    Medications Prior to Admission  Medication Sig Dispense Refill   acidophilus (RISAQUAD) CAPS capsule Take 1 capsule by mouth daily.     allopurinol (ZYLOPRIM) 100 MG tablet Take 100 mg by mouth daily.     ALPRAZolam (XANAX) 0.5 MG tablet Take 0.5 mg by mouth 2 (two) times daily.      Cholecalciferol 5000 units TABS Take 5,000 Units by mouth daily.      clotrimazole-betamethasone (LOTRISONE) cream Apply 1 application topically 2 (two) times daily.     Coenzyme Q10 (CO Q-10) 200 MG CAPS Take 200 mg by mouth daily.     Cyanocobalamin (B-12) 2500 MCG SUBL Place 2,500 mcg under the tongue daily.     fluticasone (FLONASE) 50 MCG/ACT nasal spray Place 2 sprays into both nostrils in the morning and at bedtime.  5   gabapentin (NEURONTIN) 300 MG capsule TAKE 1 CAPSULE BY MOUTH EVERY AM AND 2 CAPSULES EVERY NIGHT AT BEDTIME 90 capsule 0   hydrALAZINE (APRESOLINE) 25 MG tablet Take 25 mg by mouth 3 (three) times daily as needed (blood pressure 140 or above).     hydrOXYzine (ATARAX/VISTARIL) 50 MG tablet Take 50 mg by mouth 3 (three) times daily as needed for itching.     levothyroxine (SYNTHROID) 100 MCG tablet Take 100 mcg by mouth daily before breakfast.     loratadine (CLARITIN) 10 MG tablet Take 10 mg by mouth daily.     metoprolol (TOPROL-XL) 200 MG 24 hr tablet Take 200 mg by mouth daily. Take with or immediately following a meal.     mirabegron ER (MYRBETRIQ) 50 MG TB24 tablet Take 50 mg by mouth daily.     Multiple Vitamin (MULITIVITAMIN WITH MINERALS) TABS Take 1 tablet by mouth daily.     ondansetron (ZOFRAN) 4 MG tablet Take 1 tablet (4 mg total) by mouth every 8 (eight) hours as needed for nausea. 30 tablet 6   oxyCODONE-acetaminophen (PERCOCET) 10-325 MG tablet Take 1 tablet by mouth every 6 (six) hours as needed for moderate pain.   0   pantoprazole (PROTONIX) 40 MG tablet Take 40 mg by mouth daily.     phentermine 37.5 MG capsule Take 37.5 mg by mouth daily.     Polyethyl Glycol-Propyl Glycol (SYSTANE) 0.4-0.3 % GEL ophthalmic gel Place 1 application into both eyes at bedtime.     Propylene Glycol (SYSTANE COMPLETE) 0.6 % SOLN Place 1 drop into both eyes in the morning and at bedtime.     rosuvastatin (CRESTOR) 10 MG tablet Take 10 mg by mouth daily.      SUMAtriptan  (IMITREX) 100 MG tablet TAKE 1 TABLET(100 MG) BY MOUTH 1 TIME AS NEEDED FOR MIGRAINE. MAY REPEAT IN 2 HOURS IF HEADACHE PERSISTS OR RECURS 12 tablet 11   torsemide (DEMADEX) 20 MG tablet Take 20 mg by mouth daily.     TRULICITY 1.5 XN/2.3FT SOPN Inject 1.5 mg as directed every Sunday.  11   Vibegron (GEMTESA) 75 MG TABS Take by mouth.  zolpidem (AMBIEN) 10 MG tablet Take 10 mg by mouth at bedtime as needed for sleep.      ACCU-CHEK FASTCLIX LANCETS MISC 2 (two) times daily. for testing  11   albuterol (PROVENTIL HFA;VENTOLIN HFA) 108 (90 BASE) MCG/ACT inhaler Inhale 2 puffs into the lungs every 6 (six) hours as needed for wheezing or shortness of breath. (Patient not taking: Reported on 05/23/2021) 1 Inhaler 6   bismuth subsalicylate (PEPTO BISMOL) 262 MG/15ML suspension Take 30 mLs by mouth every 6 (six) hours as needed for indigestion or diarrhea or loose stools.     EPINEPHrine (EPIPEN 2-PAK) 0.3 mg/0.3 mL IJ SOAJ injection Inject 0.3 mLs (0.3 mg total) into the skin once. (Patient taking differently: Inject 0.3 mg into the skin daily as needed (allergic reaction).) 2 Device 4   fluticasone furoate-vilanterol (BREO ELLIPTA) 100-25 MCG/INH AEPB Inhale 1 puff into the lungs at bedtime.     furosemide (LASIX) 20 MG tablet Take 1 tablet (20 mg total) by mouth daily. Start in 2days on 5/8 (Patient not taking: No sig reported)     HYDROcodone-acetaminophen (NORCO/VICODIN) 5-325 MG tablet Take 1 tablet by mouth every 4 (four) hours as needed. (Patient not taking: No sig reported) 20 tablet 0   lidocaine (XYLOCAINE) 2 % solution Use as directed 15 mLs in the mouth or throat every 6 (six) hours as needed (throat pain). (Patient not taking: No sig reported) 100 mL 0   methylPREDNISolone (MEDROL DOSEPAK) 4 MG TBPK tablet Take as directed (Patient not taking: No sig reported) 21 tablet 0   rOPINIRole (REQUIP) 0.5 MG tablet Take 0.5 mg by mouth 3 (three) times daily as needed (cramping).     tiZANidine  (ZANAFLEX) 2 MG tablet TAKE 1 TO 2 TABLETS BY MOUTH EVERY 6 HOURS AS NEEDED FOR SPASM (Patient not taking: Reported on 05/23/2021) 771 tablet 3   tiZANidine (ZANAFLEX) 2 MG tablet TAKE 1 TO 2 TABLETS BY MOUTH EVERY 6 HOURS AS NEEDED FOR SPASM (Patient not taking: No sig reported) 60 tablet 0   tiZANidine (ZANAFLEX) 4 MG tablet Take 4 mg by mouth every 6 (six) hours as needed for muscle spasms.      Results for orders placed or performed during the hospital encounter of 06/05/21 (from the past 48 hour(s))  Basic metabolic panel     Status: Abnormal   Collection Time: 06/05/21  5:48 AM  Result Value Ref Range   Sodium 136 135 - 145 mmol/L   Potassium 3.2 (L) 3.5 - 5.1 mmol/L   Chloride 96 (L) 98 - 111 mmol/L   CO2 29 22 - 32 mmol/L   Glucose, Bld 101 (H) 70 - 99 mg/dL    Comment: Glucose reference range applies only to samples taken after fasting for at least 8 hours.   BUN 41 (H) 8 - 23 mg/dL   Creatinine, Ser 3.14 (H) 0.44 - 1.00 mg/dL   Calcium 9.5 8.9 - 10.3 mg/dL   GFR, Estimated 15 (L) >60 mL/min    Comment: (NOTE) Calculated using the CKD-EPI Creatinine Equation (2021)    Anion gap 11 5 - 15    Comment: Performed at [facility] [address], [place], [place] [address]  Glucose, capillary     Status: None   Collection Time: 06/05/21  6:22 AM  Result Value Ref Range   Glucose-Capillary 96 70 - 99 mg/dL    Comment: Glucose reference range applies only to samples taken after fasting for at least 8  hours.   No results found.  ROS All positives and negatives were listed in HPI above  Blood pressure (!) 150/53, pulse 83, temperature 98.1 F (36.7 C), temperature source Oral, resp. rate 18, height 5\' 4"  (1.626 m), weight 102 kg, SpO2 100 %. Physical Exam  A&O x3, anxious about her procedure today PERRLA EOMI Cranial nerves II through XII intact Moves all extremities equally, full strength Sensory intact light touch Cervical collar in  place  Assessment/Plan 75 year old female with  C1 anterior/posterior ring fracture with TAL disruption and lateral listhesis  -OR today for occipital cervical fusion, discussed with patient there is a high likelihood that we will need to extend the fusion down to C5 and C6 given her degenerative changes and the need for the strength of the construct.  She verbalized understanding and agrees to this needed.  All risks benefits and expected outcomes were discussed with the patient agreed upon.    Thank you for allowing me to participate in this patient's care.  Please do not hesitate to call with questions or concerns.   Elwin Sleight, [facility] Cell: [phone]

## 2021-06-05 NOTE — Transfer of Care (Signed)
Immediate Anesthesia Transfer of Care Note  Patient: Becky Hobbs  Procedure(s) Performed: OCCIPITAL- CERVICAL TWO INSTRUMENTATION AND FUSION; EXTENSION TO CERVICAL FIVE (Spine Cervical) APPLICATION OF INTRAOPERATIVE CT SCAN (Spine Cervical)  Patient Location: PACU  Anesthesia Type:General  Level of Consciousness: awake, drowsy and patient cooperative  Airway & Oxygen Therapy: Patient Spontanous Breathing and Patient connected to face mask oxygen  Post-op Assessment: Report given to RN and Post -op Vital signs reviewed and stable  Post vital signs: Reviewed and stable  Last Vitals:  Vitals Value Taken Time  BP 124/70 06/05/21 1216  Temp 36.6 C 06/05/21 1216  Pulse 79 06/05/21 1220  Resp 17 06/05/21 1220  SpO2 97 % 06/05/21 1220  Vitals shown include unvalidated device data.  Last Pain:  Vitals:   06/05/21 1216  TempSrc:   PainSc: Asleep         Complications: No notable events documented.

## 2021-06-05 NOTE — Op Note (Signed)
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 Pieter Partridge C. Shayon Trompeter, DO  ASSISTANT: Dr. Erline Levine, MD  ANESTHESIA: General Endotracheal  EBL: 50 cc  SPECIMENS: None  DRAINS: None  COMPLICATIONS: None  CONDITION: Hemodynamically stable  HISTORY: Becky Hobbs is a 75 y.o. female that developed an anterior and posterior C1 ring fracture after falling down steps.  Initially she was treated conservatively with a cervical collar for many months.  Her fall was in December 2021.  During monitoring of her fracture while wearing cervical collar, she had progressive lateral listhesis and settling of her occiput onto C1/C2.  Her ADI increased.  Therefore we discussed surgical intervention in the form of occipital cervical fusion possibly down to C5 or C6 due to her degenerative changes at C3-4 and C4-5 and autofusion at C5-6.  I discussed all the risks, benefits and expected outcomes with the patient and her family and they agreed to proceed.  PROCEDURE IN DETAIL: The patient was brought to the operating room. After induction of general anesthesia, the patient's head was fixed with the Mayfield head holder and the patient was positioned on the operative table in the prone position. All pressure points were meticulously padded.  Lateral  fluoroscopy was obtained and her positioning appeared appropriate.  Skin incision was then marked out and prepped and draped in the usual sterile fashion.  Using a 10 blade, incision was made sharply through the soft tissue down to the cervical fascia.  Using Bovie electrocautery, the suboccipital region and paraspinal musculature was elevated off the C2, C3, C4, C5 spinous processes and the lateral masses and lamina bilaterally.  Deep retractors were placed in the wound.  C1 was carefully dissected with Penfield 1 to expose the posterior arch.  There is significant abnormal motion noted at C1 and scarring around the left posterior arch.  The patient reference array was then attached to the C2 spinous process and noted to have appropriate purchase.  Patient was sterilely draped and a CT scan was performed for navigation purposes.  This was then verified to have excellent accuracy using the neuro navigation probe.  Pilot holes were created in the inferior lateral lateral masses of C2 with superior medial trajectory into the pedicle.  Using a navigated tap, 3.5 mm, the pedicles were tapped bilaterally.  This was performed to a depth of 20 mm.  Using a feeler, the borders bilaterally were noted to have all bony borders.  4.0 x 24 mm screws were selected and placed under navigation.  There was appropriate bony purchase bilaterally.  Attention was then turned to placement of the lateral mass screws.  C3 was skipped due to would have interfered with the tulips at C2.  Lateral mass screws were planned at C4 bilaterally and C5 bilaterally using a navigated drill.  A pilot hole was created, a 3.5 mm tap was used to a depth of 12 mm bilaterally at C4 and bilaterally at C5.  Using a feeler, there  was bony borders in all directions bilaterally at C4 and C5.  4.0 mm x 12 mm screw was placed at C4 on the left, 4.0 mm x 14 mm was placed at C5 on the left.  4.0 mm x 14 mm was placed at C4 and C5 on the right.  All of them had  appropriate bony purchase.  The reference array was then removed.  The occipital plate was selected, a small size fit appropriately.  This was contoured to the occiput appropriately.  Using a high-speed drill, pilot holes were created and the DIPs were tapped superiorly to a depth of 10 mm.  In the middle of the plate, 8 mm.  The plate was then secured using 4.2 mm x 8 mm screws and a 4.6 mm x 10 mm screw superiorly.  Bony purchase was appropriate.  This appeared to be contoured appropriately to the occipital region.  The lamina and lateral joints were decorticated at C2-3, C3-4, C4-5 and lamina of C5 bilaterally.  C1 was decorticated posteriorly.  The suboccipital region was decorticated with a high-speed drill.  4.0 mm head rods were then appropriately sized cut and contoured appropriately.  These were placed, followed by setscrews and final tightened to the manufacturer's recommendation bilaterally.  Spinous process was removed from C3, C4 and partial C5.  This bone graft was morselized and mixed with allograft.  This was placed along the lateral gutters and decorticated joints bilaterally.  Deep retractors were taken out of the wound.  Hemostasis was achieved with bipolar cautery.  Exparel was placed in the musculature bilaterally.  The wound was closed in layers, with 0 Vicryl sutures for muscle and fascia.  2-0 Vicryl's and 3-0 Vicryl sutures for dermis.  Skin was closed with skin glue.  Sterile dressing was applied.  At the end of the case all sponge, needle, and instrument counts were correct. The patient was then transferred to the stretcher, extubated, and taken to the post-anesthesia care unit in stable hemodynamic condition.

## 2021-06-05 NOTE — Progress Notes (Signed)
Pharmacy Antibiotic Note  Becky Hobbs is a 75 y.o. female admitted on 06/05/2021  for spinal surgery. Pharmacy has been consulted for vancomycin dosing for post-op prophylaxis.  No drains in place.  Plan: Vancomycin 1500 mg IV x 1 dose.  Height: 5\' 4"  (162.6 cm) Weight: 102 kg (224 lb 13.9 oz) IBW/kg (Calculated) : 54.7  Temp (24hrs), Avg:97.9 F (36.6 C), Min:97.8 F (36.6 C), Max:98.1 F (36.7 C)  Recent Labs  Lab 06/05/21 0548  CREATININE 3.14*    Estimated Creatinine Clearance: 18.5 mL/min (A) (by C-G formula based on SCr of 3.14 mg/dL (H)).    Allergies  Allergen Reactions   Bee Venom Anaphylaxis   Buprenorphine Hcl Hives and Itching   Erythromycin Hives and Itching   Other Anaphylaxis    bees   Penicillins Hives    Has patient had a PCN reaction causing immediate rash, facial/tongue/throat swelling, SOB or lightheadedness with hypotension: No Has patient had a PCN reaction causing severe rash involving mucus membranes or skin necrosis: No Has patient had a PCN reaction that required hospitalization: No Has patient had a PCN reaction occurring within the last 10 years: No If all of the above answers are "NO", then may proceed with Cephalosporin use.   Hydrocodone Itching   Morphine And Related Hives and Itching    Hives (moderate severity)   Tramadol Hcl Itching   Tramadol Hcl Itching   Hydrocodone-Acetaminophen Itching    Pt says it causes hallucinations     Thank you for allowing pharmacy to be a part of this patient's care.  Becky Hobbs Reason, BCCP Clinical Pharmacist  06/05/2021 3:40 PM   [facility] pharmacy phone numbers are listed on [email or web address]

## 2021-06-05 NOTE — Anesthesia Procedure Notes (Signed)
Arterial Line Insertion Start/End8/25/2022 7:57 AM, 06/05/2021 7:57 AM Performed by: CRNA  Patient location: [facility] procedure area. Preanesthetic checklist: patient identified, IV checked, site marked, risks and benefits discussed, surgical consent, monitors and equipment checked, pre-op evaluation, timeout performed and anesthesia consent Right, radial was placed Catheter size: 20 G Hand hygiene performed , maximum sterile barriers used  and Seldinger technique used Allen's test indicative of satisfactory collateral circulation Attempts: 1 Procedure performed without using ultrasound guided technique. Following insertion, dressing applied and Biopatch. Post procedure assessment: normal  Patient tolerated the procedure well with no immediate complications.

## 2021-06-06 ENCOUNTER — Encounter (HOSPITAL_COMMUNITY): Payer: Self-pay | Admitting: Neurological Surgery

## 2021-06-06 MED ORDER — OXYCODONE HCL 5 MG PO TABS
10.0000 mg | ORAL_TABLET | Freq: Four times a day (QID) | ORAL | Status: DC | PRN
  Administered 2021-06-06 – 2021-06-09 (×4): 10 mg via ORAL
  Filled 2021-06-06 (×4): qty 2

## 2021-06-06 MED ORDER — OXYCODONE HCL 5 MG PO TABS
5.0000 mg | ORAL_TABLET | ORAL | Status: DC | PRN
  Administered 2021-06-07 – 2021-06-09 (×5): 5 mg via ORAL
  Filled 2021-06-06 (×5): qty 1

## 2021-06-06 NOTE — Progress Notes (Addendum)
Subjective: Patient reports that she is having a significant amount of pain in her occipital and posterior cervical region. With movement, she reports the pain radiates into her upper back and into her bilateral shoulders. The pain is limiting her ability to lift her head of the pillow which makes it difficult for her to feed herself. No acute events overnight.   Objective: Vital signs in last 24 hours: Temp:  [97.8 F (36.6 C)-98.1 F (36.7 C)] 98.1 F (36.7 C) (08/26 0338) Pulse Rate:  [79-94] 94 (08/26 0338) Resp:  [13-20] 18 (08/26 0338) BP: (123-133)/(50-70) 131/67 (08/26 0338) SpO2:  [93 %-100 %] 100 % (08/26 0338)  Intake/Output from previous day: 08/25 0701 - 08/26 0700 In: 2602 [P.O.:440; I.V.:1753; IV Piggyback:409] Out: 2725 [Urine:2675; Blood:50] Intake/Output this shift: No intake/output data recorded.  Physical Exam: Patient is awake, A/O X 4 and conversant. They are in NAD and VSS. Doing well. Speech is fluent and appropriate. MAEW with good strength. Sensation to light touch is intact. PERLA, EOMI. CNs grossly intact. Dressing is clean dry intact. Incision is well approximated with no drainage, erythema, or edema.   Lab Results: No results for input(s): WBC, HGB, HCT, PLT in the last 72 hours. BMET Recent Labs    06/05/21 0548  NA 136  K 3.2*  CL 96*  CO2 29  GLUCOSE 101*  BUN 41*  CREATININE 3.14*  CALCIUM 9.5    Studies/Results: DG C-Arm 1-60 Min-No Report  Result Date: 06/05/2021 Fluoroscopy was utilized by the requesting physician.  No radiographic interpretation.   CT OArm Limited Study-No Report  Result Date: 06/05/2021 Fluoroscopy was utilized by the requesting physician.  No radiographic interpretation.    Assessment/Plan: 75 y.o. female with C1 Fracture with C1-2 instability following a fall is s/p Occ-C5 posterior instrumentation and fusion on 06/05/2021. She is recovering well and reports no new numbness, tingling, or weakness.  She has a  moderate amount of occipital and posterior cervical pain with intermittent radiation into the upper back and bilateral shoulders. This morning her pain was rated at a 9/10. She is requiring both PO and IV analgesics and she has severely limited movement oh her head secondary to pain. She is awaiting PT/OT evaluation. Will plan on keeping patient as an inpatient today for aggressive pain control and reassess readiness for discharge tomorrow. Continue working on pain control, mobility and ambulating patient. No cervical collar is needed.     LOS: 1 day     Marvis Moeller, DNP, NP-C 06/06/2021, 8:55 AM   Addendum: Patient seen and examined.  Agree with above.  We will decrease narcotics.  Incision is clean dry and intact.  PT recommending CIR therefore will await placement.  Tolerating diet well.  No neurodeficits.   Thank you for allowing me to participate in this patient's care.  Please do not hesitate to call with questions or concerns.   Elwin Sleight, [facility] Cell: [phone]

## 2021-06-06 NOTE — Evaluation (Signed)
Physical Therapy Evaluation Patient Details Name: Becky Hobbs MRN: [id number] DOB: 04-08-1946 Today's Date: 06/06/2021   History of Present Illness  Patient is a 75 y/o female admitted with history of fall downstairs with anterior and posterior C1 ring fracture initially treated conservatively with c-collar, but with lateral listhesis on follow up imaging with pain and headaches.  Patient underwent occiput to C5 fusion on 06/05/21.  PMH positive for arthritis, CKD, DM, GERD, HTN, back surgery x 2, hernia repair, breast surgeries.  Clinical Impression  Patient presents with decreased mobility due to weakness, pain and decreased cervical mobility after fusion.  She was living alone, but sisters, son and cousin can all provide support at d/c.  Feel she is appropriate for CIR level rehab for maximizing independence prior to d/c home.  PT to follow acutely.     Follow Up Recommendations CIR;Supervision/Assistance - 24 hour    Equipment Recommendations  None recommended by PT    Recommendations for Other Services       Precautions / Restrictions Precautions Precautions: Fall;Cervical Precaution Comments: no brace needed      Mobility  Bed Mobility Overal bed mobility: Needs Assistance Bed Mobility: Rolling;Sidelying to Sit Rolling: Mod assist Sidelying to sit: Mod assist;+2 for physical assistance;HOB elevated       General bed mobility comments: increased time, pain limited, assist for shoulders and legs    Transfers Overall transfer level: Needs assistance Equipment used: Rolling walker (2 wheeled) Transfers: Sit to/from Stand Sit to Stand: Mod assist         General transfer comment: increased time, but able to scoot to EOB without help, some lifting help to stand  Ambulation/Gait Ambulation/Gait assistance: Min assist;+2 safety/equipment Gait Distance (Feet): 15 Feet Assistive device: Rolling walker (2 wheeled) Gait Pattern/deviations: Step-to pattern;Decreased  stride length;Shuffle     General Gait Details: decreased hip and knee flexion with shuffling pattern, knees wobbly, but did not buckle, assist for balance, safety and chair close due to pain, risk for knees buckling  Stairs            Wheelchair Mobility    Modified Rankin (Stroke Patients Only)       Balance Overall balance assessment: Needs assistance Sitting-balance support: Feet supported Sitting balance-Leahy Scale: Fair Sitting balance - Comments: limited with general weakness   Standing balance support: Bilateral upper extremity supported Standing balance-Leahy Scale: Poor Standing balance comment: UE support and min A due to wobbly knees                             Pertinent Vitals/Pain Pain Assessment: Faces Faces Pain Scale: Hurts even more Pain Location: neck Pain Descriptors / Indicators: Aching;Tightness;Sore Pain Intervention(s): Premedicated before session;Monitored during session    [place] expects to be discharged to:: Private residence Living Arrangements: Alone Available Help at Discharge: Family;Available PRN/intermittently (sister stays off and on) Type of Home: House Home Access: Stairs to enter Entrance Stairs-Rails: None (there is a wall) Entrance Stairs-Number of Steps: 2 Home Layout: Two level Home Equipment: Wisdom - 4 wheels;Shower seat;Cane - single point;Adaptive equipment      Prior Function Level of Independence: Independent with assistive device(s)         Comments: fell three times after falling down the stairs, but better since using walker     Hand Dominance        Extremity/Trunk Assessment   Upper Extremity Assessment Upper Extremity Assessment: Defer to  OT evaluation    Lower Extremity Assessment Lower Extremity Assessment: RLE deficits/detail;LLE deficits/detail RLE Deficits / Details: AAROM WFL, but stiff all over, hip flexion 3/5, knee extension 4-/5, ankle DF 4/5 RLE  Sensation: decreased light touch (mild in feet) LLE Deficits / Details: AAROM WFL, but stiff all over, hip flexion 3/5, knee extension 4-/5, ankle DF 4/5 LLE Sensation: decreased light touch (mild in feet)       Communication   Communication: No difficulties  Cognition Arousal/Alertness: Awake/alert Behavior During Therapy: WFL for tasks assessed/performed Overall Cognitive Status: Within Functional Limits for tasks assessed                                        General Comments General comments (skin integrity, edema, etc.): on O2 at rest, removed for activity and SpO2 maintained at 93 or greater throughout session; patient aware of spinal precautions from previous surgeries, but reinforced throughout session    Exercises     Assessment/Plan    PT Assessment Patient needs continued PT services  PT Problem List Decreased strength;Decreased mobility;Decreased safety awareness;Decreased balance;Decreased knowledge of use of DME;Pain;Decreased activity tolerance;Decreased range of motion;Decreased coordination       PT Treatment Interventions DME instruction;Therapeutic activities;Gait training;Patient/family education;Therapeutic exercise;Stair training;Balance training;Functional mobility training    PT Goals (Current goals can be found in the Care Plan section)  Acute Rehab PT Goals Patient Stated Goal: to be more independent Time For Goal Achievement: 06/20/21 Potential to Achieve Goals: Good    Frequency Min 4X/week   Barriers to discharge        Co-evaluation PT/OT/SLP Co-Evaluation/Treatment: Yes Reason for Co-Treatment: For patient/therapist safety;To address functional/ADL transfers PT goals addressed during session: Mobility/safety with mobility;Balance;Proper use of DME         AM-PAC PT "6 Clicks" Mobility  Outcome Measure Help needed turning from your back to your side while in a flat bed without using bedrails?: A Lot Help needed moving  from lying on your back to sitting on the side of a flat bed without using bedrails?: Total Help needed moving to and from a bed to a chair (including a wheelchair)?: A Lot Help needed standing up from a chair using your arms (e.g., wheelchair or bedside chair)?: A Lot Help needed to walk in hospital room?: A Lot Help needed climbing 3-5 steps with a railing? : Total 6 Click Score: 10    End of Session Equipment Utilized During Treatment: Gait belt Activity Tolerance: Patient limited by fatigue;Patient limited by pain Patient left: in chair;with call bell/phone within reach;with chair alarm set Nurse Communication: Mobility status PT Visit Diagnosis: Other abnormalities of gait and mobility (R26.89);History of falling (Z91.81);Muscle weakness (generalized) (M62.81)    Time: 3545-6256 PT Time Calculation (min) (ACUTE ONLY): 33 min   Charges:   PT Evaluation $PT Eval Moderate Complexity: 1 Mod          Magda Kiel, PT Acute Rehabilitation Services LSLHT:[phone] [phone] 06/06/2021   Reginia Naas 06/06/2021, 11:43 AM

## 2021-06-06 NOTE — Evaluation (Signed)
Occupational Therapy Evaluation Patient Details Name: Becky Hobbs MRN: [id number] DOB: 11/28/1945 Today's Date: 06/06/2021    History of Present Illness Patient is a 75 y/o female admitted with history of fall downstairs with anterior and posterior C1 ring fracture initially treated conservatively with c-collar, but with lateral listhesis on follow up imaging with pain and headaches.  Patient underwent occiput to C5 fusion on 06/05/21.  PMH positive for arthritis, CKD, DM, GERD, HTN, back surgery x 2, hernia repair, breast surgeries.   Clinical Impression   Pt was living alone and was independent with ADLs and IADLs and ambulation with single-point cane; However, pt reports sister can stay with her post discharge. Pt with frequent falls before admission. Pt currently requiring Min A for upper body ADLs, Max A for lower body ADLs, and min guard-Mod A for functional mobility with RW. Pt presents with decreased activity tolerance, functional strength, and ROM with cervical precautions, limiting ability to complete ADLs safely and independently. Pt would benefit from OT in the acute setting to address occupational performance and safety before next level of care. Recommending CIR due to pt's high motivation, and pt continuing to need rehabilitation for increased safety post-acute stay.     Follow Up Recommendations  CIR    Equipment Recommendations  3 in 1 bedside commode    Recommendations for Other Services       Precautions / Restrictions Precautions Precautions: Fall;Cervical Precaution Comments: no brace needed      Mobility Bed Mobility Overal bed mobility: Needs Assistance Bed Mobility: Rolling;Sidelying to Sit Rolling: Mod assist Sidelying to sit: Mod assist;+2 for physical assistance;HOB elevated       General bed mobility comments: increased time, pain limited, assist for shoulders and legs    Transfers Overall transfer level: Needs assistance Equipment used: Rolling  walker (2 wheeled) Transfers: Sit to/from Stand Sit to Stand: Mod assist         General transfer comment: increased time, but able to scoot to EOB without help, some lifting help to stand    Balance Overall balance assessment: Needs assistance Sitting-balance support: Feet supported Sitting balance-Leahy Scale: Fair Sitting balance - Comments: limited with general weakness   Standing balance support: Bilateral upper extremity supported Standing balance-Leahy Scale: Poor Standing balance comment: UE support and min A due to wobbly knees                           ADL either performed or assessed with clinical judgement   ADL Overall ADL's : Needs assistance/impaired Eating/Feeding: Set up;Bed level   Grooming: Oral care;Set up;Bed level   Upper Body Bathing: Minimal assistance;Sitting   Lower Body Bathing: Maximal assistance;Sit to/from stand   Upper Body Dressing : Sitting;Adhering to UE precautions;Minimal assistance Upper Body Dressing Details (indicate cue type and reason): Donned robe EOB, pt required assistance threading arms through sleeves Lower Body Dressing: Maximal assistance;Adhering to back precautions;Sit to/from stand   Toilet Transfer: Moderate assistance;+2 for physical assistance;RW;Ambulation Toilet Transfer Details (indicate cue type and reason): Required mod A for power up Toileting- Clothing Manipulation and Hygiene: Adhering to back precautions;Sit to/from stand;Moderate assistance   Tub/ Shower Transfer: Maximal assistance;+2 for physical assistance;Rolling walker   Functional mobility during ADLs: Min guard;Rolling walker General ADL Comments: Pt with decreased functional strength, ROM, activity tolerance, and with cervical precautions limiting self-care.     Vision Baseline Vision/History: 0 No visual deficits Ability to See in Adequate Light: 0 Adequate  Patient Visual Report: No change from baseline Vision Assessment?: No apparent  visual deficits     Perception     Praxis      Pertinent Vitals/Pain Pain Assessment: Faces Pain Score: 6  Faces Pain Scale: Hurts even more Pain Location: neck Pain Descriptors / Indicators: Aching;Tightness;Sore Pain Intervention(s): Premedicated before session;Monitored during session     Hand Dominance Right   Extremity/Trunk Assessment Upper Extremity Assessment Upper Extremity Assessment: Generalized weakness   Lower Extremity Assessment Lower Extremity Assessment: Defer to PT evaluation   Cervical / Trunk Assessment Cervical / Trunk Assessment: Other exceptions Cervical / Trunk Exceptions: s/p cervical surgery   Communication Communication Communication: No difficulties   Cognition Arousal/Alertness: Awake/alert Behavior During Therapy: WFL for tasks assessed/performed Overall Cognitive Status: Within Functional Limits for tasks assessed                                 General Comments: Intact to conversation and able to follow simple directions, required increased time for processing.   General Comments  SpO2>93 during session on room air.    Exercises     Shoulder Instructions      Home Living Family/patient expects to be discharged to:: Private residence Living Arrangements: Alone Available Help at Discharge: Family;Available PRN/intermittently Type of Home: House Home Access: Stairs to enter [employer] of Steps: 2 Entrance Stairs-Rails: None Home Layout: Two level Alternate Level Stairs-Number of Steps: flight Alternate Level Stairs-Rails: Right;Left Bathroom Shower/Tub: Occupational psychologist: Handicapped height Bathroom Accessibility: Yes   Home Equipment: Environmental consultant - 4 wheels;Shower seat;Cane - single point;Adaptive equipment Adaptive Equipment: Reacher;Sock aid;Long-handled shoe horn        Prior Functioning/Environment Level of Independence: Independent with assistive device(s)        Comments:  Using single point cane before admittance        OT Problem List: Decreased strength;Decreased range of motion;Decreased activity tolerance;Impaired balance (sitting and/or standing);Decreased knowledge of use of DME or AE;Decreased knowledge of precautions;Pain      OT Treatment/Interventions: Self-care/ADL training;Therapeutic exercise;Therapeutic activities;Patient/family education;DME and/or AE instruction    OT Goals(Current goals can be found in the care plan section) Acute Rehab OT Goals Patient Stated Goal: to be more independent OT Goal Formulation: With patient Time For Goal Achievement: 06/20/21 Potential to Achieve Goals: Good ADL Goals Pt Will Perform Grooming: with set-up;sitting Pt Will Perform Upper Body Dressing: sitting;with set-up Pt Will Perform Lower Body Dressing: with mod assist;sit to/from stand Pt Will Transfer to Toilet: with min assist;ambulating Pt Will Perform Toileting - Clothing Manipulation and hygiene: with min assist;sit to/from stand Pt Will Perform Tub/Shower Transfer: with mod assist;rolling walker  OT Frequency: Min 2X/week   Barriers to D/C:            Co-evaluation PT/OT/SLP Co-Evaluation/Treatment: Yes Reason for Co-Treatment: For patient/therapist safety;To address functional/ADL transfers PT goals addressed during session: Mobility/safety with mobility OT goals addressed during session: ADL's and self-care      AM-PAC OT "6 Clicks" Daily Activity     Outcome Measure Help from another person eating meals?: A Little Help from another person taking care of personal grooming?: A Lot Help from another person toileting, which includes using toliet, bedpan, or urinal?: A Lot Help from another person bathing (including washing, rinsing, drying)?: A Lot Help from another person to put on and taking off regular upper body clothing?: A Little Help from another person to  put on and taking off regular lower body clothing?: A Lot 6 Click Score:  14   End of Session Equipment Utilized During Treatment: Rolling walker;Gait belt Nurse Communication: Mobility status  Activity Tolerance: Patient tolerated treatment well Patient left: in chair;with call bell/phone within reach;with chair alarm set  OT Visit Diagnosis: Muscle weakness (generalized) (M62.81);Unsteadiness on feet (R26.81);Other abnormalities of gait and mobility (R26.89);Pain Pain - part of body:  (back)                Time: 2919-1660 OT Time Calculation (min): 28 min Charges:  OT General Charges $OT Visit: 1 Visit OT Evaluation $OT Eval Moderate Complexity: 1 Mod  Jackquline Denmark, OTS Acute Rehab Office: [phone]   Becky Hobbs 06/06/2021, 3:59 PM

## 2021-06-06 NOTE — Progress Notes (Signed)
Foley Catheter removed, replaced with purewick.

## 2021-06-07 ENCOUNTER — Other Ambulatory Visit: Payer: Self-pay

## 2021-06-07 NOTE — Progress Notes (Signed)
Patient ID: Becky Hobbs, female   DOB: 1946/08/26, 75 y.o.   MRN: [id number] BP (!) 145/56 (BP Location: Left Arm)   Pulse 91   Temp 98.7 F (37.1 C) (Oral)   Resp 19   Ht 5\' 4"  (1.626 m)   Wt 102 kg   SpO2 96%   BMI 38.60 kg/m  Alert , uncomfortable, moving all extremities Speech is clear and fluent Will discontinue central line

## 2021-06-07 NOTE — Progress Notes (Signed)
Physical Therapy Treatment Patient Details Name: Becky Hobbs MRN: [id number] DOB: 1946-07-30 Today's Date: 06/07/2021    History of Present Illness Patient is a 75 y/o female admitted with history of fall downstairs with anterior and posterior C1 ring fracture initially treated conservatively with c-collar, but with lateral listhesis on follow up imaging with pain and headaches.  Patient underwent occiput to C5 fusion on 06/05/21.  PMH positive for arthritis, CKD, DM, GERD, HTN, back surgery x 2, hernia repair, breast surgeries.    PT Comments    Patient continues to be limited by pain in neck and shoulders. Patient requires modA for bed mobility and modA for transfers with RW. Patient performed seated exercises for LE strengthening. Patient deferred further mobility due to increased pain. Continue to recommend comprehensive inpatient rehab (CIR) for post-acute therapy needs.    Follow Up Recommendations  CIR;Supervision/Assistance - 24 hour     Equipment Recommendations  None recommended by PT    Recommendations for Other Services       Precautions / Restrictions Precautions Precautions: Fall;Cervical Precaution Comments: able to recall 3/3 precautions; no brace needed Restrictions Weight Bearing Restrictions: No    Mobility  Bed Mobility Overal bed mobility: Needs Assistance Bed Mobility: Rolling;Sidelying to Sit Rolling: Mod assist Sidelying to sit: Mod assist       General bed mobility comments: increased time, increased pain. ModA for rolling, bringing LEs of bed and trunk elevation    Transfers Overall transfer level: Needs assistance Equipment used: Rolling Glynn Freas (2 wheeled) Transfers: Sit to/from [employer] Sit to Stand: Mod assist Stand pivot transfers: Min assist       General transfer comment: modA to rise and steady with increased time. Cues for hand placement. Patient taking pivotal steps with minA  Ambulation/Gait              General Gait Details: deferred due to increased pain   Stairs             Wheelchair Mobility    Modified Rankin (Stroke Patients Only)       Balance Overall balance assessment: Needs assistance Sitting-balance support: Feet supported Sitting balance-Leahy Scale: Fair     Standing balance support: Bilateral upper extremity supported Standing balance-Leahy Scale: Poor Standing balance comment: requires UE support and external assist                            Cognition Arousal/Alertness: Awake/alert Behavior During Therapy: WFL for tasks assessed/performed Overall Cognitive Status: Within Functional Limits for tasks assessed                                        Exercises General Exercises - Lower Extremity Ankle Circles/Pumps: Both;10 reps;Seated Long Arc Quad: Both;10 reps;Seated Hip Flexion/Marching: Both;10 reps;Seated    General Comments        Pertinent Vitals/Pain Pain Assessment: 0-10 Pain Score: 7  Pain Location: neck Pain Descriptors / Indicators: Aching;Tightness;Sore Pain Intervention(s): Monitored during session;Repositioned;Limited activity within patient's tolerance    Home Living                      Prior Function            PT Goals (current goals can now be found in the care plan section) Acute Rehab PT Goals Patient Stated Goal: to be  more independent Time For Goal Achievement: 06/20/21 Potential to Achieve Goals: Good Progress towards PT goals: Progressing toward goals    Frequency    Min 5X/week      PT Plan Current plan remains appropriate    Co-evaluation              AM-PAC PT "6 Clicks" Mobility   Outcome Measure  Help needed turning from your back to your side while in a flat bed without using bedrails?: A Lot Help needed moving from lying on your back to sitting on the side of a flat bed without using bedrails?: A Lot Help needed moving to and from a bed to a  chair (including a wheelchair)?: A Lot Help needed standing up from a chair using your arms (e.g., wheelchair or bedside chair)?: A Lot Help needed to walk in hospital room?: A Lot Help needed climbing 3-5 steps with a railing? : Total 6 Click Score: 11    End of Session Equipment Utilized During Treatment: Gait belt Activity Tolerance: Patient limited by pain;Patient limited by fatigue Patient left: in chair;with call bell/phone within reach;with chair alarm set Nurse Communication: Mobility status PT Visit Diagnosis: Other abnormalities of gait and mobility (R26.89);History of falling (Z91.81);Muscle weakness (generalized) (M62.81)     Time: 8638-1771 PT Time Calculation (min) (ACUTE ONLY): 30 min  Charges:  $Therapeutic Exercise: 8-22 mins $Therapeutic Activity: 8-22 mins                     Keyah Blizard A. Gilford Rile PT, DPT Acute Rehabilitation Services Pager [phone] Office [phone]    Linna Hoff 06/07/2021, 3:44 PM

## 2021-06-07 NOTE — Progress Notes (Signed)
Inpatient Rehab Admissions:  Inpatient Rehab Consult received.  I met with patient at the bedside for rehabilitation assessment and to discuss goals and expectations of an inpatient rehab admission.  Pt acknowledged understanding of CIR goals and expectations. Pt interested in pursuing CIR. Pt gave permission to contact sister, [name]. Left a message; awaiting return call.  Will continue to follow.  Signed: Gayland Curry, [place], [place] Admissions Coordinator [phone]

## 2021-06-08 MED ORDER — ALBUTEROL SULFATE (2.5 MG/3ML) 0.083% IN NEBU: 2.5000 mg | INHALATION_SOLUTION | Freq: Four times a day (QID) | RESPIRATORY_TRACT | Status: DC | PRN

## 2021-06-08 NOTE — Progress Notes (Signed)
Inpatient Rehab Admissions Coordinator:  Continue to be unable to contact pt's sister, [name]. Left a message; awaiting return call. Will continue to follow.   Gayland Curry, [place], [place] Admissions Coordinator [phone]

## 2021-06-08 NOTE — Progress Notes (Signed)
BP (!) 131/55 (BP Location: Left Arm)   Pulse 81   Temp 98.8 F (37.1 C) (Axillary)   Resp 18   Ht 5\' 4"  (1.626 m)   Wt 102 kg   SpO2 92%   BMI 38.60 kg/m  Alert and oriented x 4, speech is clear Fluent Is quite sore Wound is clean and dry

## 2021-06-08 NOTE — PMR Pre-admission (Signed)
PMR Admission Coordinator Pre-Admission Assessment  Patient: Becky Hobbs is an 75 y.o., female MRN: [id number] DOB: [birth year] Height: _0  (162.6 cm) Weight: 102 kg  Insurance Information HMO:     PPO:      PCP:      IPA:      80/20: yes     OTHER:  PRIMARY: Medicare A & B      Policy#: [id number]      Subscriber: patient CM Name:       Phone#:      Fax#:  Pre-Cert#:       Employer:  Benefits:  Phone #: verified eligibility online via [employer] on 06/08/21     Name:  Eff. Date: Part A & B effective 05/12/13     Deduct: $1.556      Out of Pocket Max: NA      Life Max: NA CIR: 100%      SNF: 100% days 1-20, 80% days 21-100 Outpatient: 80%     Co-Pay: 20% Home Health: 100%      Co-Pay:  DME: 80%     Co-Pay: 20% Providers: pt's choice SECONDARY: BCBS Supplement      Policy#: [id number]     Phone#: [phone]  Financial Counselor:       Phone#:   The "Data Collection Information Summary" for patients in Inpatient Rehabilitation Facilities with attached "Privacy Act [place] Records" was provided and verbally reviewed with: Patient  Emergency Contact Information Contact Information     Name Relation Home Work Mobile   [place] Son [phone]     Lowella Dell [phone]  [phone]   Countess, Biebel Daughter [phone]     Sharalyn Ink Sister   [phone]       Current Medical History  Patient Admitting Diagnosis: closed C1 fx s/p occiput to C5 posterior instrumentation and fusion  History of Present Illness: Pt is aa 75 year old female with medical hs significant for: CKD, DM, GERD, HTN, back surgery x2, hernia repair, breast surgeries, arthritis. Pt had a fall at home 09/14/20 which resulted in anterior and posterior C1 ring fracture. Pt treated conservatively with c collar and follow-up imaging. Pt continued to have lateral listhesis with severe occipital HA and right neck pain. She was admitted on 06/05/21 for C1-C5 arthrodesis by Dr.  Reatha Armour. Therapy evaluations performed and CIR recommended d/t pt's decline in functional mobility and ability to perform ADLs.     Patient's medical record from [facility] has been reviewed by the rehabilitation admission coordinator and physician.  Past Medical History  Past Medical History:  Diagnosis Date   Anemia    Anxiety    panic attacks   Arthritis    Asthma    Blood transfusion    1973--with twins birth   Bronchitis    Chronic kidney disease    Diabetes mellitus    borderline   GERD (gastroesophageal reflux disease)    H/O hiatal hernia    surgery fixed   Headache    migraines   Heart murmur    MVP   Hepatitis    Hep C   Hypertension    Hypothyroidism    Pneumonia     Has the patient had major surgery during 100 days prior to admission? Yes  Family History   family history includes Kidney disease in her mother; Vascular Disease in her mother.  Current Medications  Current Facility-Administered Medications:    0.9 %  sodium chloride infusion, 250 mL, Intravenous,  Continuous, Dawley, Troy C, DO, Last Rate: 1 mL/hr at 06/05/21 1611, 250 mL at 06/05/21 1611   acetaminophen (TYLENOL) tablet 650 mg, 650 mg, Oral, Q4H PRN, 650 mg at 06/08/21 1308 **OR** acetaminophen (TYLENOL) suppository 650 mg, 650 mg, Rectal, Q4H PRN, Dawley, Troy C, DO   albuterol (PROVENTIL) (2.5 MG/3ML) 0.083% nebulizer solution 2.5 mg, 2.5 mg, Nebulization, Q6H PRN, Dawley, Troy C, DO   ALPRAZolam (XANAX) tablet 0.5 mg, 0.5 mg, Oral, BID, Dawley, Troy C, DO, 0.5 mg at 06/09/21 1115   Chlorhexidine Gluconate Cloth 2 % PADS 6 each, 6 each, Topical, Daily, Dawley, Troy C, DO, 6 each at 06/08/21 1019   fluticasone (FLONASE) 50 MCG/ACT nasal spray 2 spray, 2 spray, Each Nare, Daily, Dawley, Troy C, DO, 2 spray at 06/09/21 0932   fluticasone furoate-vilanterol (BREO ELLIPTA) 100-25 MCG/INH 1 puff, 1 puff, Inhalation, QHS, Dawley, Troy C, DO, 1 puff at 06/08/21 2140   gabapentin (NEURONTIN)  capsule 300 mg, 300 mg, Oral, BID, Dawley, Troy C, DO, 300 mg at 06/09/21 5208   hydrALAZINE (APRESOLINE) tablet 25 mg, 25 mg, Oral, TID PRN, Dawley, Troy C, DO   HYDROmorphone (DILAUDID) injection 0.5 mg, 0.5 mg, Intravenous, Q3H PRN, Dawley, Troy C, DO, 0.5 mg at 06/06/21 0901   hydrOXYzine (ATARAX/VISTARIL) tablet 50 mg, 50 mg, Oral, TID PRN, Dawley, Troy C, DO, 50 mg at 06/08/21 1029   lactated ringers infusion, , Intravenous, Continuous, Dawley, Troy C, DO, Last Rate: 10 mL/hr at 06/05/21 2120, Restarted at 06/05/21 2120   levothyroxine (SYNTHROID) tablet 100 mcg, 100 mcg, Oral, QAC breakfast, Dawley, Troy C, DO, 100 mcg at 06/09/21 0551   loratadine (CLARITIN) tablet 10 mg, 10 mg, Oral, Daily, Dawley, Troy C, DO, 10 mg at 06/09/21 0223   menthol-cetylpyridinium (CEPACOL) lozenge 3 mg, 1 lozenge, Oral, PRN **OR** phenol (CHLORASEPTIC) mouth spray 1 spray, 1 spray, Mouth/Throat, PRN, Dawley, Troy C, DO   methocarbamol (ROBAXIN) tablet 750 mg, 750 mg, Oral, Q6H PRN, 750 mg at 06/06/21 0909 **OR** methocarbamol (ROBAXIN) 500 mg in dextrose 5 % 50 mL IVPB, 500 mg, Intravenous, Q6H PRN, Dawley, Troy C, DO   metoprolol succinate (TOPROL-XL) 24 hr tablet 200 mg, 200 mg, Oral, Daily, Dawley, Troy C, DO, 200 mg at 06/08/21 1020   mirabegron ER (MYRBETRIQ) tablet 50 mg, 50 mg, Oral, Daily, Dawley, Troy C, DO, 50 mg at 06/09/21 0920   ondansetron (ZOFRAN) tablet 4 mg, 4 mg, Oral, Q6H PRN **OR** ondansetron (ZOFRAN) injection 4 mg, 4 mg, Intravenous, Q6H PRN, Dawley, Troy C, DO   oxyCODONE (Oxy IR/ROXICODONE) immediate release tablet 10 mg, 10 mg, Oral, Q6H PRN, Dawley, Troy C, DO, 10 mg at 06/09/21 0920   oxyCODONE (Oxy IR/ROXICODONE) immediate release tablet 5 mg, 5 mg, Oral, Q4H PRN, Dawley, Troy C, DO, 5 mg at 06/08/21 1030   pantoprazole (PROTONIX) EC tablet 40 mg, 40 mg, Oral, Daily, Dawley, Troy C, DO, 40 mg at 06/09/21 3612   rosuvastatin (CRESTOR) tablet 10 mg, 10 mg, Oral, Daily, Dawley, Troy C,  DO, 10 mg at 06/09/21 2449   senna-docusate (Senokot-S) tablet 1 tablet, 1 tablet, Oral, QHS PRN, Dawley, Troy C, DO   sodium chloride flush (NS) 0.9 % injection 3 mL, 3 mL, Intravenous, Q12H, Dawley, Troy C, DO, 3 mL at 06/08/21 2140   sodium chloride flush (NS) 0.9 % injection 3 mL, 3 mL, Intravenous, PRN, Dawley, Troy C, DO, 3 mL at 06/05/21 1613   SUMAtriptan (IMITREX) tablet 25 mg, 25 mg,  Oral, Q2H PRN, Dawley, Troy C, DO   torsemide (DEMADEX) tablet 20 mg, 20 mg, Oral, Daily, Dawley, Troy C, DO, 20 mg at 06/09/21 0102   zolpidem (AMBIEN) tablet 5 mg, 5 mg, Oral, QHS PRN, Dawley, Troy C, DO  Patients Current Diet:  Diet Order             Diet Heart Room service appropriate? Yes; Fluid consistency: Thin  Diet effective now                   Precautions / Restrictions Precautions Precautions: Fall, Cervical Precaution Comments: needs reminder for precautions 8/29; no brace needed Restrictions Weight Bearing Restrictions: No   Has the patient had 2 or more falls or a fall with injury in the past year? Yes  Prior Activity Level Limited Community (1-2x/wk): gets out of the house 1-2x/week  Prior Functional Level Self Care: Did the patient need help bathing, dressing, using the toilet or eating? Needed some help  Indoor Mobility: Did the patient need assistance with walking from room to room (with or without device)? Independent  Stairs: Did the patient need assistance with internal or external stairs (with or without device)? Independent  Functional Cognition: Did the patient need help planning regular tasks such as shopping or remembering to take medications? Independent  Patient Information Are you of Hispanic, Latino/a,or Spanish origin?: A. No, not of Hispanic, Latino/a, or Spanish origin What is your race?: B. Black or African American Do you need or want an interpreter to communicate with a doctor or health care staff?: 0. No  Patient's Response To:  Health  Literacy and Transportation Is the patient able to respond to health literacy and transportation needs?: Yes Health Literacy - How often do you need to have someone help you when you read instructions, pamphlets, or other written material from your doctor or pharmacy?: Sometimes In the past 12 months, has lack of transportation kept you from medical appointments or from getting medications?: No In the past 12 months, has lack of transportation kept you from meetings, work, or from getting things needed for daily living?: No  Home Assistive Devices / [place] Devices/Equipment: Environmental consultant (specify type) Home Equipment: Environmental consultant - 4 wheels, Shower seat, Cane - single point, Adaptive equipment  Prior Device Use: Indicate devices/aids used by the patient prior to current illness, exacerbation or injury? Walker and cane  Current Functional Level Cognition  Overall Cognitive Status: Within Functional Limits for tasks assessed Orientation Level: Oriented X4 General Comments: Intact to conversation and able to follow simple directions, required increased time for processing. Pt perseverating on need to urinate and neck pain but was not able to urinate until she stood to pivot to chair; purewick in place; may need briefs in future sessions if purewick not donned    Extremity Assessment (includes Sensation/Coordination)  Upper Extremity Assessment: Generalized weakness  Lower Extremity Assessment: Defer to PT evaluation RLE Deficits / Details: AAROM WFL, but stiff all over, hip flexion 3/5, knee extension 4-/5, ankle DF 4/5 RLE Sensation: decreased light touch (mild in feet) LLE Deficits / Details: AAROM WFL, but stiff all over, hip flexion 3/5, knee extension 4-/5, ankle DF 4/5 LLE Sensation: decreased light touch (mild in feet)    ADLs  Overall ADL's : Needs assistance/impaired Eating/Feeding: Set up, Bed level Grooming: Oral care, Set up, Bed level Upper Body Bathing: Minimal  assistance, Sitting Lower Body Bathing: Maximal assistance, Sit to/from stand Upper Body Dressing : Sitting, Adhering to UE  precautions, Minimal assistance Upper Body Dressing Details (indicate cue type and reason): Donned robe EOB, pt required assistance threading arms through sleeves Lower Body Dressing: Maximal assistance, Adhering to back precautions, Sit to/from stand Toilet Transfer: Moderate assistance, +2 for physical assistance, RW, Ambulation Toilet Transfer Details (indicate cue type and reason): Required mod A for power up Toileting- Clothing Manipulation and Hygiene: Adhering to back precautions, Sit to/from stand, Moderate assistance Tub/ Shower Transfer: Maximal assistance, +2 for physical assistance, Rolling walker Functional mobility during ADLs: Min guard, Rolling walker General ADL Comments: Pt with decreased functional strength, ROM, activity tolerance, and with cervical precautions limiting self-care.    Mobility  Overal bed mobility: Needs Assistance Bed Mobility: Rolling, Sidelying to Sit Rolling: Max assist Sidelying to sit: Mod assist General bed mobility comments: increased time, increased pain. MaxA for rolling, modA bringing LEs of bed and trunk elevation with heavy cues for technique, pt drowsy and slow to initiate    Transfers  Overall transfer level: Needs assistance Equipment used: Rolling walker (2 wheeled) Transfers: Sit to/from Stand, [employer] Transfers Sit to Stand: Mod assist, +2 safety/equipment, From elevated surface Stand pivot transfers: Min assist, +2 safety/equipment General transfer comment: modA to rise and steady with increased time. Cues for hand placement. Patient taking pivotal steps with minA and increased time, second staff member present for safety but not needing to physically assist    Ambulation / Gait / Stairs / Wheelchair Mobility  Ambulation/Gait Ambulation/Gait assistance: Min assist, +2 safety/equipment Gait Distance (Feet):  8 Feet Assistive device: Rolling walker (2 wheeled) Gait Pattern/deviations: Step-to pattern, Decreased stride length, Shuffle General Gait Details: lateral steps toward chair then forward/backward steps in front of chair, distance limited due to pt c/o severe neck pain Gait velocity: grossly <0.2 m/s Gait velocity interpretation: <1.8 ft/sec, indicate of risk for recurrent falls    Posture / Balance Dynamic Sitting Balance Sitting balance - Comments: limited with general weakness Balance Overall balance assessment: Needs assistance Sitting-balance support: Feet supported Sitting balance-Leahy Scale: Fair Sitting balance - Comments: limited with general weakness Standing balance support: Bilateral upper extremity supported Standing balance-Leahy Scale: Poor Standing balance comment: requires UE support and external assist    Special needs/care consideration Continuous Drip IV  lactated ringers infusion; 0.9% sodium chloride infusion, Bladder incontinence and Skin Surgical incision: neck   Previous Home Environment (from acute therapy documentation) Living Arrangements: Alone Available Help at Discharge: Family, Available 24 hours/day Type of Home: House Home Layout: Two level, 1/2 bath on main level Alternate Level Stairs-Rails: Left Alternate Level Stairs-Number of Steps: 14 Home Access: Stairs to enter Entrance Stairs-Rails: Right, Left Entrance Stairs-Number of Steps: 3 Bathroom Shower/Tub: Gaffer, Chiropodist: Handicapped height Bathroom Accessibility: Yes How Accessible: Accessible via walker Home Care Services: No  Discharge Living Setting Plans for Discharge Living Setting: Patient's home Type of Home at Discharge: House Discharge Home Layout: Two level, 1/2 bath on main level Alternate Level Stairs-Number of Steps: 14 Discharge Home Access: Stairs to enter Entrance Stairs-Number of Steps: 3 Discharge Bathroom Shower/Tub: Walk-in shower,  Tub/shower unit Discharge Bathroom Toilet: Handicapped height Discharge Bathroom Accessibility: Yes How Accessible: Accessible via walker Does the patient have any problems obtaining your medications?: No  Social/Family/Support Systems Anticipated Caregiver: Sherrilee Gilles, sister, and other family Anticipated Caregiver's Contact Information: [phone] Caregiver Availability: 24/7 Discharge Plan Discussed with Primary Caregiver: Yes Is Caregiver In Agreement with Plan?: Yes Does Caregiver/Family have Issues with Lodging/Transportation while Pt is in Rehab?: No  Goals Patient/Family Goal for Rehab: Mod I-Supervision:PT/OT Expected length of stay: 10-12 days Pt/Family Agrees to Admission and willing to participate: Yes Program Orientation Provided & Reviewed with Pt/Caregiver Including Roles  & Responsibilities: Yes  Decrease burden of Care through IP rehab admission: NA  Possible need for SNF placement upon discharge: Not anticipated  Patient Condition: I have [facility], spoken with CM, and patient and family member. I met with patient at the bedside and discussed via phone for inpatient rehabilitation assessment.  Patient will benefit from ongoing PT and OT, can actively participate in 3 hours of therapy a day 5 days of the week, and can make measurable gains during the admission.  Patient will also benefit from the coordinated team approach during an Inpatient Acute Rehabilitation admission.  The patient will receive intensive therapy as well as Rehabilitation physician, nursing, social worker, and care management interventions.  Due to safety, skin/wound care, disease management, medication administration, pain management, and patient education the patient requires 24 hour a day rehabilitation nursing.  The patient is currently Min-Mod A with mobility and basic ADLs.  Discharge setting and therapy post discharge at home with home health is anticipated.   Patient has agreed to participate in the Acute Inpatient Rehabilitation Program and will admit today.  Preadmission Screen Completed By:  Bethel Born, 06/09/2021 12:16 PM ______________________________________________________________________   Discussed status with Dr. Ranell Patrick on 06/09/21  at 12:16 PM and received approval for admission today.  Admission Coordinator:  Bethel Born, CCC-SLP, time 12:16 PM/Date 06/09/21    Assessment/Plan: Diagnosis: C1 ring fracture s/[ C1-C5 arthrodesis Does the need for close, 24 hr/day Medical supervision in concert with the patient's rehab needs make it unreasonable for this patient to be served in a less intensive setting? Yes Co-Morbidities requiring supervision/potential complications: anemia, anxiety, arthritis, asthma, CKD Due to bladder management, bowel management, safety, skin/wound care, disease management, medication administration, pain management, and patient education, does the patient require 24 hr/day rehab nursing? Yes Does the patient require coordinated care of a physician, rehab nurse, PT, OT to address physical and functional deficits in the context of the above medical diagnosis(es)? Yes Addressing deficits in the following areas: balance, endurance, locomotion, strength, transferring, bowel/bladder control, bathing, dressing, feeding, grooming, toileting, and psychosocial support Can the patient actively participate in an intensive therapy program of at least 3 hrs of therapy 5 days a week? Yes The potential for patient to make measurable gains while on inpatient rehab is excellent Anticipated functional outcomes upon discharge from inpatient rehab: modified independent PT, modified independent OT, independent SLP Estimated rehab length of stay to reach the above functional goals is: 7-10 days Anticipated discharge destination: Home 10. Overall Rehab/Functional Prognosis: excellent   MD Signature: Leeroy Cha, MD

## 2021-06-09 ENCOUNTER — Encounter (HOSPITAL_COMMUNITY): Payer: Self-pay | Admitting: Physical Medicine and Rehabilitation

## 2021-06-09 ENCOUNTER — Inpatient Hospital Stay (HOSPITAL_COMMUNITY)
Admission: RE | Admit: 2021-06-09 | Discharge: 2021-06-24 | DRG: 560 | Disposition: A | Discharge status: Home Health | Payer: Medicare Other | Source: Intra-hospital | Attending: Physical Medicine and Rehabilitation | Admitting: Physical Medicine and Rehabilitation

## 2021-06-09 ENCOUNTER — Other Ambulatory Visit: Payer: Self-pay

## 2021-06-09 DIAGNOSIS — E039 Hypothyroidism, unspecified: Secondary | ICD-10-CM | POA: Diagnosis present

## 2021-06-09 DIAGNOSIS — J45909 Unspecified asthma, uncomplicated: Secondary | ICD-10-CM | POA: Diagnosis present

## 2021-06-09 DIAGNOSIS — Z7951 Long term (current) use of inhaled steroids: Secondary | ICD-10-CM

## 2021-06-09 DIAGNOSIS — I129 Hypertensive chronic kidney disease with stage 1 through stage 4 chronic kidney disease, or unspecified chronic kidney disease: Secondary | ICD-10-CM | POA: Diagnosis present

## 2021-06-09 DIAGNOSIS — Z87891 Personal history of nicotine dependence: Secondary | ICD-10-CM

## 2021-06-09 DIAGNOSIS — Z79899 Other long term (current) drug therapy: Secondary | ICD-10-CM | POA: Diagnosis not present

## 2021-06-09 DIAGNOSIS — D72829 Elevated white blood cell count, unspecified: Secondary | ICD-10-CM | POA: Diagnosis not present

## 2021-06-09 DIAGNOSIS — K592 Neurogenic bowel, not elsewhere classified: Secondary | ICD-10-CM | POA: Diagnosis present

## 2021-06-09 DIAGNOSIS — B1920 Unspecified viral hepatitis C without hepatic coma: Secondary | ICD-10-CM | POA: Diagnosis present

## 2021-06-09 DIAGNOSIS — S0003XA Contusion of scalp, initial encounter: Secondary | ICD-10-CM | POA: Diagnosis not present

## 2021-06-09 DIAGNOSIS — I9589 Other hypotension: Secondary | ICD-10-CM | POA: Diagnosis present

## 2021-06-09 DIAGNOSIS — D72828 Other elevated white blood cell count: Secondary | ICD-10-CM | POA: Diagnosis present

## 2021-06-09 DIAGNOSIS — Z79890 Hormone replacement therapy (postmenopausal): Secondary | ICD-10-CM

## 2021-06-09 DIAGNOSIS — Z4789 Encounter for other orthopedic aftercare: Secondary | ICD-10-CM | POA: Diagnosis not present

## 2021-06-09 DIAGNOSIS — E785 Hyperlipidemia, unspecified: Secondary | ICD-10-CM | POA: Diagnosis not present

## 2021-06-09 DIAGNOSIS — I10 Essential (primary) hypertension: Secondary | ICD-10-CM | POA: Diagnosis present

## 2021-06-09 DIAGNOSIS — F419 Anxiety disorder, unspecified: Secondary | ICD-10-CM | POA: Diagnosis present

## 2021-06-09 DIAGNOSIS — K219 Gastro-esophageal reflux disease without esophagitis: Secondary | ICD-10-CM | POA: Diagnosis present

## 2021-06-09 DIAGNOSIS — Z981 Arthrodesis status: Secondary | ICD-10-CM | POA: Diagnosis not present

## 2021-06-09 DIAGNOSIS — S12000A Unspecified displaced fracture of first cervical vertebra, initial encounter for closed fracture: Secondary | ICD-10-CM

## 2021-06-09 DIAGNOSIS — Z90710 Acquired absence of both cervix and uterus: Secondary | ICD-10-CM | POA: Diagnosis not present

## 2021-06-09 DIAGNOSIS — E876 Hypokalemia: Secondary | ICD-10-CM | POA: Diagnosis present

## 2021-06-09 DIAGNOSIS — N390 Urinary tract infection, site not specified: Secondary | ICD-10-CM | POA: Diagnosis present

## 2021-06-09 DIAGNOSIS — E1122 Type 2 diabetes mellitus with diabetic chronic kidney disease: Secondary | ICD-10-CM | POA: Diagnosis not present

## 2021-06-09 DIAGNOSIS — S12001D Unspecified nondisplaced fracture of first cervical vertebra, subsequent encounter for fracture with routine healing: Secondary | ICD-10-CM | POA: Diagnosis not present

## 2021-06-09 DIAGNOSIS — N319 Neuromuscular dysfunction of bladder, unspecified: Secondary | ICD-10-CM | POA: Diagnosis present

## 2021-06-09 DIAGNOSIS — F410 Panic disorder [episodic paroxysmal anxiety] without agoraphobia: Secondary | ICD-10-CM | POA: Diagnosis present

## 2021-06-09 DIAGNOSIS — B965 Pseudomonas (aeruginosa) (mallei) (pseudomallei) as the cause of diseases classified elsewhere: Secondary | ICD-10-CM | POA: Diagnosis present

## 2021-06-09 DIAGNOSIS — G43909 Migraine, unspecified, not intractable, without status migrainosus: Secondary | ICD-10-CM | POA: Diagnosis present

## 2021-06-09 DIAGNOSIS — E669 Obesity, unspecified: Secondary | ICD-10-CM | POA: Diagnosis present

## 2021-06-09 DIAGNOSIS — N189 Chronic kidney disease, unspecified: Secondary | ICD-10-CM | POA: Diagnosis present

## 2021-06-09 DIAGNOSIS — S12001S Unspecified nondisplaced fracture of first cervical vertebra, sequela: Secondary | ICD-10-CM

## 2021-06-09 DIAGNOSIS — K7460 Unspecified cirrhosis of liver: Secondary | ICD-10-CM | POA: Diagnosis present

## 2021-06-09 DIAGNOSIS — G8929 Other chronic pain: Secondary | ICD-10-CM

## 2021-06-09 DIAGNOSIS — G44329 Chronic post-traumatic headache, not intractable: Secondary | ICD-10-CM

## 2021-06-09 DIAGNOSIS — N184 Chronic kidney disease, stage 4 (severe): Secondary | ICD-10-CM | POA: Diagnosis not present

## 2021-06-09 DIAGNOSIS — N179 Acute kidney failure, unspecified: Secondary | ICD-10-CM | POA: Diagnosis present

## 2021-06-09 DIAGNOSIS — D72819 Decreased white blood cell count, unspecified: Secondary | ICD-10-CM | POA: Diagnosis not present

## 2021-06-09 LAB — CBC WITH DIFFERENTIAL/PLATELET
Abs Immature Granulocytes: 0.06 10*3/uL (ref 0.00–0.07)
Basophils Absolute: 0 10*3/uL (ref 0.0–0.1)
Basophils Relative: 0 %
Eosinophils Absolute: 0.5 10*3/uL (ref 0.0–0.5)
Eosinophils Relative: 5 %
HCT: 33 % — ABNORMAL LOW (ref 36.0–46.0)
Hemoglobin: 10.4 g/dL — ABNORMAL LOW (ref 12.0–15.0)
Immature Granulocytes: 1 %
Lymphocytes Relative: 13 %
Lymphs Abs: 1.4 10*3/uL (ref 0.7–4.0)
MCH: 28 pg (ref 26.0–34.0)
MCHC: 31.5 g/dL (ref 30.0–36.0)
MCV: 88.7 fL (ref 80.0–100.0)
Monocytes Absolute: 0.9 10*3/uL (ref 0.1–1.0)
Monocytes Relative: 8 %
Neutro Abs: 7.6 10*3/uL (ref 1.7–7.7)
Neutrophils Relative %: 73 %
Platelets: 278 10*3/uL (ref 150–400)
RBC: 3.72 MIL/uL — ABNORMAL LOW (ref 3.87–5.11)
RDW: 14.8 % (ref 11.5–15.5)
WBC: 10.3 10*3/uL (ref 4.0–10.5)
nRBC: 0 % (ref 0.0–0.2)

## 2021-06-09 LAB — COMPREHENSIVE METABOLIC PANEL
ALT: 54 U/L — ABNORMAL HIGH (ref 0–44)
AST: 85 U/L — ABNORMAL HIGH (ref 15–41)
Albumin: 2.8 g/dL — ABNORMAL LOW (ref 3.5–5.0)
Alkaline Phosphatase: 135 U/L — ABNORMAL HIGH (ref 38–126)
Anion gap: 10 (ref 5–15)
BUN: 35 mg/dL — ABNORMAL HIGH (ref 8–23)
CO2: 29 mmol/L (ref 22–32)
Calcium: 9.2 mg/dL (ref 8.9–10.3)
Chloride: 95 mmol/L — ABNORMAL LOW (ref 98–111)
Creatinine, Ser: 2.06 mg/dL — ABNORMAL HIGH (ref 0.44–1.00)
GFR, Estimated: 25 mL/min — ABNORMAL LOW (ref >60)
Glucose, Bld: 122 mg/dL — ABNORMAL HIGH (ref 70–99)
Potassium: 3.5 mmol/L (ref 3.5–5.1)
Sodium: 134 mmol/L — ABNORMAL LOW (ref 135–145)
Total Bilirubin: 1 mg/dL (ref 0.3–1.2)
Total Protein: 7 g/dL (ref 6.5–8.1)

## 2021-06-09 LAB — GLUCOSE, CAPILLARY: Glucose-Capillary: 124 mg/dL — ABNORMAL HIGH (ref 70–99)

## 2021-06-09 LAB — AMMONIA: Ammonia: 9 umol/L (ref 9–35)

## 2021-06-09 MED ORDER — ALBUTEROL SULFATE (2.5 MG/3ML) 0.083% IN NEBU: 2.5000 mg | INHALATION_SOLUTION | Freq: Four times a day (QID) | RESPIRATORY_TRACT | Status: DC | PRN

## 2021-06-09 MED ORDER — DULAGLUTIDE 1.5 MG/0.5ML ~~LOC~~ SOAJ: 1.5000 mg | SUBCUTANEOUS | Status: DC

## 2021-06-09 MED ORDER — PANTOPRAZOLE SODIUM 40 MG PO TBEC
40.0000 mg | DELAYED_RELEASE_TABLET | Freq: Every day | ORAL | Status: DC
  Administered 2021-06-10 – 2021-06-24 (×15): 40 mg via ORAL
  Filled 2021-06-09 (×15): qty 1

## 2021-06-09 MED ORDER — ALLOPURINOL 100 MG PO TABS
100.0000 mg | ORAL_TABLET | Freq: Every day | ORAL | Status: DC
  Administered 2021-06-10 – 2021-06-24 (×15): 100 mg via ORAL
  Filled 2021-06-09 (×14): qty 1

## 2021-06-09 MED ORDER — FLEET ENEMA 7-19 GM/118ML RE ENEM: 1.0000 | ENEMA | Freq: Once | RECTAL | Status: DC | PRN

## 2021-06-09 MED ORDER — POLYETHYLENE GLYCOL 3350 17 G PO PACK
17.0000 g | PACK | Freq: Every day | ORAL | Status: DC | PRN
Start: 2021-06-09 — End: 2021-06-24

## 2021-06-09 MED ORDER — INSULIN ASPART 100 UNIT/ML IJ SOLN
0.0000 [IU] | Freq: Three times a day (TID) | INTRAMUSCULAR | Status: DC
Start: 2021-06-09 — End: 2021-06-24
  Administered 2021-06-09: 1 [IU] via SUBCUTANEOUS
  Administered 2021-06-10: 3 [IU] via SUBCUTANEOUS
  Administered 2021-06-10: 2 [IU] via SUBCUTANEOUS
  Administered 2021-06-11 – 2021-06-15 (×8): 1 [IU] via SUBCUTANEOUS
  Administered 2021-06-16 (×3): 2 [IU] via SUBCUTANEOUS
  Administered 2021-06-17 (×2): 1 [IU] via SUBCUTANEOUS
  Administered 2021-06-17: 2 [IU] via SUBCUTANEOUS
  Administered 2021-06-20 – 2021-06-24 (×6): 1 [IU] via SUBCUTANEOUS

## 2021-06-09 MED ORDER — INSULIN ASPART 100 UNIT/ML IJ SOLN
0.0000 [IU] | Freq: Every day | INTRAMUSCULAR | Status: DC
Start: 2021-06-09 — End: 2021-06-24

## 2021-06-09 MED ORDER — TRAZODONE HCL 50 MG PO TABS
25.0000 mg | ORAL_TABLET | Freq: Every evening | ORAL | Status: DC | PRN
  Administered 2021-06-17: 25 mg via ORAL
  Administered 2021-06-17 – 2021-06-23 (×6): 50 mg via ORAL
  Filled 2021-06-09 (×8): qty 1

## 2021-06-09 MED ORDER — SUMATRIPTAN SUCCINATE 100 MG PO TABS
100.0000 mg | ORAL_TABLET | ORAL | Status: DC | PRN
  Administered 2021-06-12 – 2021-06-22 (×2): 100 mg via ORAL
  Filled 2021-06-09 (×3): qty 1

## 2021-06-09 MED ORDER — ALPRAZOLAM 0.5 MG PO TABS
0.5000 mg | ORAL_TABLET | Freq: Every day | ORAL | Status: DC
  Administered 2021-06-09 – 2021-06-23 (×15): 0.5 mg via ORAL
  Filled 2021-06-09 (×15): qty 1

## 2021-06-09 MED ORDER — OXYCODONE HCL 5 MG PO TABS
5.0000 mg | ORAL_TABLET | ORAL | Status: DC | PRN
  Administered 2021-06-10 – 2021-06-24 (×18): 5 mg via ORAL
  Filled 2021-06-09 (×19): qty 1

## 2021-06-09 MED ORDER — ALBUTEROL SULFATE HFA 108 (90 BASE) MCG/ACT IN AERS: 2.0000 | INHALATION_SPRAY | Freq: Four times a day (QID) | RESPIRATORY_TRACT | Status: DC | PRN

## 2021-06-09 MED ORDER — METOPROLOL SUCCINATE ER 50 MG PO TB24
200.0000 mg | ORAL_TABLET | Freq: Every day | ORAL | Status: DC
  Administered 2021-06-10 – 2021-06-15 (×6): 200 mg via ORAL
  Filled 2021-06-09 (×6): qty 4

## 2021-06-09 MED ORDER — ADULT MULTIVITAMIN W/MINERALS CH
1.0000 | ORAL_TABLET | Freq: Every day | ORAL | Status: DC
  Administered 2021-06-09 – 2021-06-24 (×16): 1 via ORAL
  Filled 2021-06-09 (×16): qty 1

## 2021-06-09 MED ORDER — MIRABEGRON ER 50 MG PO TB24
50.0000 mg | ORAL_TABLET | Freq: Every day | ORAL | Status: DC
  Administered 2021-06-10 – 2021-06-24 (×15): 50 mg via ORAL
  Filled 2021-06-09 (×15): qty 1

## 2021-06-09 MED ORDER — DULAGLUTIDE 0.75 MG/0.5ML ~~LOC~~ SOAJ
1.5000 mg | SUBCUTANEOUS | Status: DC
  Administered 2021-06-10 – 2021-06-24 (×3): 1.5 mg via SUBCUTANEOUS
  Filled 2021-06-09 (×4): qty 1

## 2021-06-09 MED ORDER — FLUTICASONE PROPIONATE 50 MCG/ACT NA SUSP
2.0000 | Freq: Every day | NASAL | Status: DC | PRN
  Administered 2021-06-22: 2 via NASAL

## 2021-06-09 MED ORDER — DIPHENHYDRAMINE HCL 12.5 MG/5ML PO ELIX: 12.5000 mg | ORAL_SOLUTION | Freq: Four times a day (QID) | ORAL | Status: DC | PRN

## 2021-06-09 MED ORDER — ACETAMINOPHEN 325 MG PO TABS
325.0000 mg | ORAL_TABLET | ORAL | Status: DC | PRN
  Administered 2021-06-10 – 2021-06-16 (×3): 650 mg via ORAL
  Filled 2021-06-09 (×2): qty 2
  Filled 2021-06-09: qty 1
  Filled 2021-06-09: qty 2

## 2021-06-09 MED ORDER — GABAPENTIN 300 MG PO CAPS
300.0000 mg | ORAL_CAPSULE | Freq: Two times a day (BID) | ORAL | Status: DC
  Administered 2021-06-09 – 2021-06-24 (×30): 300 mg via ORAL
  Filled 2021-06-09 (×30): qty 1

## 2021-06-09 MED ORDER — PROCHLORPERAZINE MALEATE 5 MG PO TABS
5.0000 mg | ORAL_TABLET | Freq: Four times a day (QID) | ORAL | Status: DC | PRN
  Administered 2021-06-10 – 2021-06-12 (×2): 10 mg via ORAL
  Filled 2021-06-09 (×2): qty 2

## 2021-06-09 MED ORDER — ALUM & MAG HYDROXIDE-SIMETH 200-200-20 MG/5ML PO SUSP
30.0000 mL | ORAL | Status: DC | PRN
  Administered 2021-06-14: 30 mL via ORAL
  Filled 2021-06-09: qty 30

## 2021-06-09 MED ORDER — PROCHLORPERAZINE 25 MG RE SUPP: 12.5000 mg | Freq: Four times a day (QID) | RECTAL | Status: DC | PRN

## 2021-06-09 MED ORDER — BISACODYL 10 MG RE SUPP: 10.0000 mg | Freq: Every day | RECTAL | Status: DC | PRN

## 2021-06-09 MED ORDER — MUSCLE RUB 10-15 % EX CREA
TOPICAL_CREAM | Freq: Three times a day (TID) | CUTANEOUS | Status: DC
  Administered 2021-06-09 – 2021-06-22 (×5): 1 via TOPICAL
  Filled 2021-06-09: qty 85

## 2021-06-09 MED ORDER — RISAQUAD PO CAPS
1.0000 | ORAL_CAPSULE | Freq: Every day | ORAL | Status: DC
  Administered 2021-06-09 – 2021-06-24 (×16): 1 via ORAL
  Filled 2021-06-09 (×16): qty 1

## 2021-06-09 MED ORDER — B-12 2500 MCG SL SUBL: 2500.0000 ug | SUBLINGUAL_TABLET | Freq: Every day | SUBLINGUAL | Status: DC

## 2021-06-09 MED ORDER — PROPYLENE GLYCOL 0.6 % OP SOLN: 1.0000 [drp] | Freq: Two times a day (BID) | OPHTHALMIC | Status: DC

## 2021-06-09 MED ORDER — CYCLOBENZAPRINE HCL 5 MG PO TABS: 5.0000 mg | ORAL_TABLET | Freq: Three times a day (TID) | ORAL | Status: DC | PRN

## 2021-06-09 MED ORDER — VITAMIN B-12 1000 MCG PO TABS
2500.0000 ug | ORAL_TABLET | Freq: Every day | ORAL | Status: DC
  Administered 2021-06-09 – 2021-06-24 (×16): 2500 ug via ORAL
  Filled 2021-06-09 (×16): qty 3

## 2021-06-09 MED ORDER — GUAIFENESIN-DM 100-10 MG/5ML PO SYRP
5.0000 mL | ORAL_SOLUTION | Freq: Four times a day (QID) | ORAL | Status: DC | PRN
  Administered 2021-06-15 – 2021-06-18 (×2): 10 mL via ORAL
  Filled 2021-06-09 (×2): qty 10

## 2021-06-09 MED ORDER — ROPINIROLE HCL 1 MG PO TABS: 0.5000 mg | ORAL_TABLET | Freq: Three times a day (TID) | ORAL | Status: DC | PRN

## 2021-06-09 MED ORDER — FLUTICASONE FUROATE-VILANTEROL 100-25 MCG/INH IN AEPB
1.0000 | INHALATION_SPRAY | Freq: Every day | RESPIRATORY_TRACT | Status: DC
  Administered 2021-06-11 – 2021-06-24 (×12): 1 via RESPIRATORY_TRACT
  Filled 2021-06-09: qty 28

## 2021-06-09 MED ORDER — ALPRAZOLAM 0.5 MG PO TABS: 0.5000 mg | ORAL_TABLET | Freq: Two times a day (BID) | ORAL | Status: DC

## 2021-06-09 MED ORDER — VITAMIN D3 25 MCG (1000 UNIT) PO TABS
5000.0000 [IU] | ORAL_TABLET | Freq: Every day | ORAL | Status: DC
  Administered 2021-06-09 – 2021-06-24 (×17): 5000 [IU] via ORAL
  Filled 2021-06-09 (×32): qty 5

## 2021-06-09 MED ORDER — TORSEMIDE 20 MG PO TABS
20.0000 mg | ORAL_TABLET | Freq: Every day | ORAL | Status: DC
  Administered 2021-06-10 – 2021-06-23 (×14): 20 mg via ORAL
  Filled 2021-06-09 (×15): qty 1

## 2021-06-09 MED ORDER — LORATADINE 10 MG PO TABS
10.0000 mg | ORAL_TABLET | Freq: Every day | ORAL | Status: DC
  Administered 2021-06-10 – 2021-06-24 (×15): 10 mg via ORAL
  Filled 2021-06-09 (×15): qty 1

## 2021-06-09 MED ORDER — PROCHLORPERAZINE EDISYLATE 10 MG/2ML IJ SOLN: 5.0000 mg | Freq: Four times a day (QID) | INTRAMUSCULAR | Status: DC | PRN

## 2021-06-09 MED ORDER — TROLAMINE SALICYLATE 10 % EX CREA
TOPICAL_CREAM | Freq: Three times a day (TID) | CUTANEOUS | Status: DC
  Filled 2021-06-09: qty 85

## 2021-06-09 MED ORDER — ROSUVASTATIN CALCIUM 5 MG PO TABS
10.0000 mg | ORAL_TABLET | Freq: Every day | ORAL | Status: DC
  Administered 2021-06-10 – 2021-06-23 (×14): 10 mg via ORAL
  Filled 2021-06-09 (×16): qty 2

## 2021-06-09 MED ORDER — OXYCODONE HCL 5 MG PO TABS: 10.0000 mg | ORAL_TABLET | ORAL | Status: DC | PRN

## 2021-06-09 MED ORDER — POLYVINYL ALCOHOL 1.4 % OP SOLN
1.0000 [drp] | Freq: Two times a day (BID) | OPHTHALMIC | Status: DC
  Administered 2021-06-09 – 2021-06-24 (×20): 1 [drp] via OPHTHALMIC
  Filled 2021-06-09 (×2): qty 15

## 2021-06-09 MED ORDER — LEVOTHYROXINE SODIUM 100 MCG PO TABS
100.0000 ug | ORAL_TABLET | Freq: Every day | ORAL | Status: DC
  Administered 2021-06-10 – 2021-06-24 (×15): 100 ug via ORAL
  Filled 2021-06-09 (×15): qty 1

## 2021-06-09 NOTE — Progress Notes (Signed)
Came into rm and seen family member feeding pt instead of her feeding herself

## 2021-06-09 NOTE — Progress Notes (Signed)
Patient ID: Becky Hobbs, female   DOB: 03/16/1946, 75 y.o.   MRN: [id number] Patient arrived at [place] this afternoon. No complaints of pain at this time, call light and personal belongings within reach. Angie Fava

## 2021-06-09 NOTE — Progress Notes (Signed)
Signed                                                                                                                                                                                                                                                                                                                                                                                                                                                                                                                       PMR Admission Coordinator Pre-Admission Assessment   Patient: Becky Hobbs is an 75 y.o., female MRN: [id number] DOB: [birth year] Height: 5\' 4"  (162.6 cm) Weight: 102 kg   Insurance Information HMO:     PPO:      PCP:      IPA:      80/20: yes     OTHER:  PRIMARY: Medicare A & B      Policy#: [id number]      Subscriber: patient CM Name:       Phone#:      Fax#:  Pre-Cert#:       Employer:  Benefits:  Phone #: verified eligibility online via [employer] on 06/08/21     Name:  Eff. Date: Part A & B effective 05/12/13     Deduct: $1.556      Out of Pocket Max: NA      Life Max: NA CIR: 100%      SNF: 100% days 1-20, 80% days 21-100 Outpatient: 80%     Co-Pay: 20% Home Health: 100%      Co-Pay:  DME: 80%     Co-Pay: 20% Providers: pt's choice SECONDARY: BCBS Supplement      Policy#: [id number]     Phone#: [phone]   Financial Counselor:       Phone#:    The "Data Collection Information Summary" for patients in Inpatient Rehabilitation Facilities with attached "Privacy Act [place] Records" was provided and verbally reviewed with: Patient   Emergency Contact Information Contact Information       Name Relation Home Work Mobile    [place] Son [phone]        Lowella Dell [phone]    [phone]    Jonni, [place] Daughter [phone]        Sharalyn Ink Sister     [phone]           Current Medical History  Patient Admitting Diagnosis: closed C1 fx s/p occiput to C5 posterior instrumentation and fusion   History of Present Illness: Pt is aa 75 year old female with medical hs significant for: CKD, DM, GERD, HTN, back surgery x2, hernia repair, breast surgeries, arthritis. Pt had a fall at home 09/14/20 which resulted in anterior and posterior C1 ring fracture. Pt treated conservatively with c collar and follow-up imaging. Pt continued to have lateral listhesis with severe occipital HA and right neck pain. She was admitted on 06/05/21 for C1-C5 arthrodesis by Dr. Reatha Armour. Therapy evaluations performed and CIR recommended d/t pt's decline in functional mobility and ability to perform ADLs.    Patient's medical record from [facility] has been reviewed by the rehabilitation admission coordinator and physician.   Past Medical History      Past Medical History:  Diagnosis Date   Anemia     Anxiety      panic attacks   Arthritis     Asthma     Blood transfusion      1973--with twins birth   Bronchitis     Chronic kidney disease     Diabetes mellitus      borderline   GERD (gastroesophageal reflux disease)     H/O hiatal hernia      surgery fixed   Headache      migraines   Heart murmur      MVP   Hepatitis      Hep C   Hypertension     Hypothyroidism     Pneumonia        Has the patient had major surgery during 100 days prior to admission? Yes   Family History   family history includes Kidney disease in her mother; Vascular Disease in her mother.   Current Medications   Current Facility-Administered Medications:    0.9 %  sodium chloride infusion, 250 mL, Intravenous, Continuous, Dawley, Troy C, DO, Last Rate: 1 mL/hr at 06/05/21 1611, 250 mL at 06/05/21 1611   acetaminophen (TYLENOL) tablet 650 mg, 650 mg, Oral, Q4H PRN, 650 mg at  06/08/21 1308 **OR** acetaminophen (TYLENOL) suppository 650 mg, 650 mg,  Rectal, Q4H PRN, Dawley, Troy C, DO   albuterol (PROVENTIL) (2.5 MG/3ML) 0.083% nebulizer solution 2.5 mg, 2.5 mg, Nebulization, Q6H PRN, Dawley, Troy C, DO   ALPRAZolam (XANAX) tablet 0.5 mg, 0.5 mg, Oral, BID, Dawley, Troy C, DO, 0.5 mg at 06/09/21 5638   Chlorhexidine Gluconate Cloth 2 % PADS 6 each, 6 each, Topical, Daily, Dawley, Troy C, DO, 6 each at 06/08/21 1019   fluticasone (FLONASE) 50 MCG/ACT nasal spray 2 spray, 2 spray, Each Nare, Daily, Dawley, Troy C, DO, 2 spray at 06/09/21 0932   fluticasone furoate-vilanterol (BREO ELLIPTA) 100-25 MCG/INH 1 puff, 1 puff, Inhalation, QHS, Dawley, Troy C, DO, 1 puff at 06/08/21 2140   gabapentin (NEURONTIN) capsule 300 mg, 300 mg, Oral, BID, Dawley, Troy C, DO, 300 mg at 06/09/21 9373   hydrALAZINE (APRESOLINE) tablet 25 mg, 25 mg, Oral, TID PRN, Dawley, Troy C, DO   HYDROmorphone (DILAUDID) injection 0.5 mg, 0.5 mg, Intravenous, Q3H PRN, Dawley, Troy C, DO, 0.5 mg at 06/06/21 0901   hydrOXYzine (ATARAX/VISTARIL) tablet 50 mg, 50 mg, Oral, TID PRN, Dawley, Troy C, DO, 50 mg at 06/08/21 1029   lactated ringers infusion, , Intravenous, Continuous, Dawley, Troy C, DO, Last Rate: 10 mL/hr at 06/05/21 2120, Restarted at 06/05/21 2120   levothyroxine (SYNTHROID) tablet 100 mcg, 100 mcg, Oral, QAC breakfast, Dawley, Troy C, DO, 100 mcg at 06/09/21 0551   loratadine (CLARITIN) tablet 10 mg, 10 mg, Oral, Daily, Dawley, Troy C, DO, 10 mg at 06/09/21 4287   menthol-cetylpyridinium (CEPACOL) lozenge 3 mg, 1 lozenge, Oral, PRN **OR** phenol (CHLORASEPTIC) mouth spray 1 spray, 1 spray, Mouth/Throat, PRN, Dawley, Troy C, DO   methocarbamol (ROBAXIN) tablet 750 mg, 750 mg, Oral, Q6H PRN, 750 mg at 06/06/21 0909 **OR** methocarbamol (ROBAXIN) 500 mg in dextrose 5 % 50 mL IVPB, 500 mg, Intravenous, Q6H PRN, Dawley, Troy C, DO   metoprolol succinate (TOPROL-XL) 24 hr tablet 200 mg, 200 mg, Oral,  Daily, Dawley, Troy C, DO, 200 mg at 06/08/21 1020   mirabegron ER (MYRBETRIQ) tablet 50 mg, 50 mg, Oral, Daily, Dawley, Troy C, DO, 50 mg at 06/09/21 0920   ondansetron (ZOFRAN) tablet 4 mg, 4 mg, Oral, Q6H PRN **OR** ondansetron (ZOFRAN) injection 4 mg, 4 mg, Intravenous, Q6H PRN, Dawley, Troy C, DO   oxyCODONE (Oxy IR/ROXICODONE) immediate release tablet 10 mg, 10 mg, Oral, Q6H PRN, Dawley, Troy C, DO, 10 mg at 06/09/21 0920   oxyCODONE (Oxy IR/ROXICODONE) immediate release tablet 5 mg, 5 mg, Oral, Q4H PRN, Dawley, Troy C, DO, 5 mg at 06/08/21 1030   pantoprazole (PROTONIX) EC tablet 40 mg, 40 mg, Oral, Daily, Dawley, Troy C, DO, 40 mg at 06/09/21 6811   rosuvastatin (CRESTOR) tablet 10 mg, 10 mg, Oral, Daily, Dawley, Troy C, DO, 10 mg at 06/09/21 5726   senna-docusate (Senokot-S) tablet 1 tablet, 1 tablet, Oral, QHS PRN, Dawley, Troy C, DO   sodium chloride flush (NS) 0.9 % injection 3 mL, 3 mL, Intravenous, Q12H, Dawley, Troy C, DO, 3 mL at 06/08/21 2140   sodium chloride flush (NS) 0.9 % injection 3 mL, 3 mL, Intravenous, PRN, Dawley, Troy C, DO, 3 mL at 06/05/21 1613   SUMAtriptan (IMITREX) tablet 25 mg, 25 mg, Oral, Q2H PRN, Dawley, Troy C, DO   torsemide (DEMADEX) tablet 20 mg, 20 mg, Oral, Daily, Dawley, Troy C, DO, 20 mg at 06/09/21 2035   zolpidem (AMBIEN) tablet 5 mg, 5 mg, Oral, QHS PRN, Dawley, Troy C, DO  Patients Current Diet:  Diet Order                  Diet Heart Room service appropriate? Yes; Fluid consistency: Thin  Diet effective now                         Precautions / Restrictions Precautions Precautions: Fall, Cervical Precaution Comments: needs reminder for precautions 8/29; no brace needed Restrictions Weight Bearing Restrictions: No    Has the patient had 2 or more falls or a fall with injury in the past year? Yes   Prior Activity Level Limited Community (1-2x/wk): gets out of the house 1-2x/week   Prior Functional Level Self Care: Did the  patient need help bathing, dressing, using the toilet or eating? Needed some help   Indoor Mobility: Did the patient need assistance with walking from room to room (with or without device)? Independent   Stairs: Did the patient need assistance with internal or external stairs (with or without device)? Independent   Functional Cognition: Did the patient need help planning regular tasks such as shopping or remembering to take medications? Independent   Patient Information Are you of Hispanic, Latino/a,or Spanish origin?: A. No, not of Hispanic, Latino/a, or Spanish origin What is your race?: B. Black or African American Do you need or want an interpreter to communicate with a doctor or health care staff?: 0. No   Patient's Response To:  Health Literacy and Transportation Is the patient able to respond to health literacy and transportation needs?: Yes Health Literacy - How often do you need to have someone help you when you read instructions, pamphlets, or other written material from your doctor or pharmacy?: Sometimes In the past 12 months, has lack of transportation kept you from medical appointments or from getting medications?: No In the past 12 months, has lack of transportation kept you from meetings, work, or from getting things needed for daily living?: No   Home Assistive Devices / [place] Devices/Equipment: Environmental consultant (specify type) Home Equipment: Environmental consultant - 4 wheels, Shower seat, Cane - single point, Adaptive equipment   Prior Device Use: Indicate devices/aids used by the patient prior to current illness, exacerbation or injury? Walker and cane   Current Functional Level Cognition   Overall Cognitive Status: Within Functional Limits for tasks assessed Orientation Level: Oriented X4 General Comments: Intact to conversation and able to follow simple directions, required increased time for processing. Pt perseverating on need to urinate and neck pain but was not able to  urinate until she stood to pivot to chair; purewick in place; may need briefs in future sessions if purewick not donned    Extremity Assessment (includes Sensation/Coordination)   Upper Extremity Assessment: Generalized weakness  Lower Extremity Assessment: Defer to PT evaluation RLE Deficits / Details: AAROM WFL, but stiff all over, hip flexion 3/5, knee extension 4-/5, ankle DF 4/5 RLE Sensation: decreased light touch (mild in feet) LLE Deficits / Details: AAROM WFL, but stiff all over, hip flexion 3/5, knee extension 4-/5, ankle DF 4/5 LLE Sensation: decreased light touch (mild in feet)     ADLs   Overall ADL's : Needs assistance/impaired Eating/Feeding: Set up, Bed level Grooming: Oral care, Set up, Bed level Upper Body Bathing: Minimal assistance, Sitting Lower Body Bathing: Maximal assistance, Sit to/from stand Upper Body Dressing : Sitting, Adhering to UE precautions, Minimal assistance Upper Body Dressing Details (indicate cue type and reason): Donned robe EOB, pt required assistance  threading arms through sleeves Lower Body Dressing: Maximal assistance, Adhering to back precautions, Sit to/from stand Toilet Transfer: Moderate assistance, +2 for physical assistance, RW, Ambulation Toilet Transfer Details (indicate cue type and reason): Required mod A for power up Toileting- Clothing Manipulation and Hygiene: Adhering to back precautions, Sit to/from stand, Moderate assistance Tub/ Shower Transfer: Maximal assistance, +2 for physical assistance, Rolling walker Functional mobility during ADLs: Min guard, Rolling walker General ADL Comments: Pt with decreased functional strength, ROM, activity tolerance, and with cervical precautions limiting self-care.     Mobility   Overal bed mobility: Needs Assistance Bed Mobility: Rolling, Sidelying to Sit Rolling: Max assist Sidelying to sit: Mod assist General bed mobility comments: increased time, increased pain. MaxA for rolling, modA  bringing LEs of bed and trunk elevation with heavy cues for technique, pt drowsy and slow to initiate     Transfers   Overall transfer level: Needs assistance Equipment used: Rolling walker (2 wheeled) Transfers: Sit to/from Stand, [employer] Transfers Sit to Stand: Mod assist, +2 safety/equipment, From elevated surface Stand pivot transfers: Min assist, +2 safety/equipment General transfer comment: modA to rise and steady with increased time. Cues for hand placement. Patient taking pivotal steps with minA and increased time, second staff member present for safety but not needing to physically assist     Ambulation / Gait / Stairs / Wheelchair Mobility   Ambulation/Gait Ambulation/Gait assistance: Min assist, +2 safety/equipment Gait Distance (Feet): 8 Feet Assistive device: Rolling walker (2 wheeled) Gait Pattern/deviations: Step-to pattern, Decreased stride length, Shuffle General Gait Details: lateral steps toward chair then forward/backward steps in front of chair, distance limited due to pt c/o severe neck pain Gait velocity: grossly <0.2 m/s Gait velocity interpretation: <1.8 ft/sec, indicate of risk for recurrent falls     Posture / Balance Dynamic Sitting Balance Sitting balance - Comments: limited with general weakness Balance Overall balance assessment: Needs assistance Sitting-balance support: Feet supported Sitting balance-Leahy Scale: Fair Sitting balance - Comments: limited with general weakness Standing balance support: Bilateral upper extremity supported Standing balance-Leahy Scale: Poor Standing balance comment: requires UE support and external assist     Special needs/care consideration Continuous Drip IV  lactated ringers infusion; 0.9% sodium chloride infusion, Bladder incontinence and Skin Surgical incision: neck    Previous Home Environment (from acute therapy documentation) Living Arrangements: Alone Available Help at Discharge: Family, Available 24  hours/day Type of Home: House Home Layout: Two level, 1/2 bath on main level Alternate Level Stairs-Rails: Left Alternate Level Stairs-Number of Steps: 14 Home Access: Stairs to enter Entrance Stairs-Rails: Right, Left Entrance Stairs-Number of Steps: 3 Bathroom Shower/Tub: Gaffer, Chiropodist: Handicapped height Bathroom Accessibility: Yes How Accessible: Accessible via walker Home Care Services: No   Discharge Living Setting Plans for Discharge Living Setting: Patient's home Type of Home at Discharge: House Discharge Home Layout: Two level, 1/2 bath on main level Alternate Level Stairs-Number of Steps: 14 Discharge Home Access: Stairs to enter Entrance Stairs-Number of Steps: 3 Discharge Bathroom Shower/Tub: Walk-in shower, Tub/shower unit Discharge Bathroom Toilet: Handicapped height Discharge Bathroom Accessibility: Yes How Accessible: Accessible via walker Does the patient have any problems obtaining your medications?: No   Social/Family/Support Systems Anticipated Caregiver: Sherrilee Gilles, sister, and other family Anticipated Caregiver's Contact Information: [phone] Caregiver Availability: 24/7 Discharge Plan Discussed with Primary Caregiver: Yes Is Caregiver In Agreement with Plan?: Yes Does Caregiver/Family have Issues with Lodging/Transportation while Pt is in Rehab?: No   Goals Patient/Family Goal for Rehab: Mod  I-Supervision:PT/OT Expected length of stay: 10-12 days Pt/Family Agrees to Admission and willing to participate: Yes Program Orientation Provided & Reviewed with Pt/Caregiver Including Roles  & Responsibilities: Yes   Decrease burden of Care through IP rehab admission: NA   Possible need for SNF placement upon discharge: Not anticipated   Patient Condition: I have reviewed medical records from [facility], spoken with CM, and patient and family member. I met with patient at the bedside and discussed via phone for  inpatient rehabilitation assessment.  Patient will benefit from ongoing PT and OT, can actively participate in 3 hours of therapy a day 5 days of the week, and can make measurable gains during the admission.  Patient will also benefit from the coordinated team approach during an Inpatient Acute Rehabilitation admission.  The patient will receive intensive therapy as well as Rehabilitation physician, nursing, social worker, and care management interventions.  Due to safety, skin/wound care, disease management, medication administration, pain management, and patient education the patient requires 24 hour a day rehabilitation nursing.  The patient is currently Min-Mod A with mobility and basic ADLs.  Discharge setting and therapy post discharge at home with home health is anticipated.  Patient has agreed to participate in the Acute Inpatient Rehabilitation Program and will admit today.   Preadmission Screen Completed By:  Bethel Born, 06/09/2021 12:16 PM ______________________________________________________________________   Discussed status with Dr. Ranell Patrick on 06/09/21  at 12:16 PM and received approval for admission today.   Admission Coordinator:  Bethel Born, CCC-SLP, time 12:16 PM/Date 06/09/21     Assessment/Plan: Diagnosis: C1 ring fracture s/[ C1-C5 arthrodesis Does the need for close, 24 hr/day Medical supervision in concert with the patient's rehab needs make it unreasonable for this patient to be served in a less intensive setting? Yes Co-Morbidities requiring supervision/potential complications: anemia, anxiety, arthritis, asthma, CKD Due to bladder management, bowel management, safety, skin/wound care, disease management, medication administration, pain management, and patient education, does the patient require 24 hr/day rehab nursing? Yes Does the patient require coordinated care of a physician, rehab nurse, PT, OT to address physical and functional deficits in the  context of the above medical diagnosis(es)? Yes Addressing deficits in the following areas: balance, endurance, locomotion, strength, transferring, bowel/bladder control, bathing, dressing, feeding, grooming, toileting, and psychosocial support Can the patient actively participate in an intensive therapy program of at least 3 hrs of therapy 5 days a week? Yes The potential for patient to make measurable gains while on inpatient rehab is excellent Anticipated functional outcomes upon discharge from inpatient rehab: modified independent PT, modified independent OT, independent SLP Estimated rehab length of stay to reach the above functional goals is: 7-10 days Anticipated discharge destination: Home 10. Overall Rehab/Functional Prognosis: excellent     MD Signature: Leeroy Cha, MD

## 2021-06-09 NOTE — Progress Notes (Addendum)
Inpatient Rehabilitation Medication Review by a Pharmacist  A complete drug regimen review was completed for this patient to identify any potential clinically significant medication issues.  High Risk Drug Classes Is patient taking? Indication by Medication  Antipsychotic No   Anticoagulant No   Antibiotic No   Opioid Yes Severe pain for C1 fx s/p surgery  Antiplatelet No   Hypoglycemics/insulin Yes SSI, Dulaglutide for DM  Vasoactive Medication Yes Toprol, Demadex for HTN,   Chemotherapy No   Other Yes Xanax BID for anixiety and panic attacks, Requip PRN cramping     Type of Medication Issue Identified Description of Issue Recommendation(s)  Drug Interaction(s) (clinically significant)     Duplicate Therapy     Allergy     No Medication Administration End Date     Incorrect Dose  Gabapentin 300mg  BID Gabapentin 300mg  daily and 600mg /hs  Additional Drug Therapy Needed  Synthroid 115mcg/d Claritin 10mg /d Resume orders  Significant med changes from prior encounter (inform family/care partners about these prior to discharge).    Other  Spoke to pt about brining in Trulicity from home F/u for pt's son to bring in med.    Clinically significant medication issues were identified that warrant physician communication and completion of prescribed/recommended actions by midnight of the next day:  Yes  Name of provider notified for urgent issues identified: [place]  Provider Method of Notification: Chat  Pharmacist comments:   Time spent performing this drug regimen review (minutes):  53min  Avish Torry S. Alford Highland, PharmD, BCPS Clinical Staff Pharmacist [email or web address] [place] 06/09/2021 1:12 PM

## 2021-06-09 NOTE — Progress Notes (Signed)
Inpatient Rehabilitation  Patient information reviewed and entered into eRehab system by Lauralynn Loeb M. Austan Nicholl, M.A., CCC/SLP, PPS Coordinator.  Information including medical coding, functional ability and quality indicators will be reviewed and updated through discharge.    

## 2021-06-09 NOTE — Progress Notes (Signed)
Inpatient Rehab Admissions Coordinator:  There is a bed available for pt to admit to CIR today. Dr. Reatha Armour made aware and is in agreement. Pt, Becky Hobbs (pt's sister), TOC, and NSG aware.   Gayland Curry, [place], [place] Admissions Coordinator [phone]

## 2021-06-09 NOTE — Progress Notes (Signed)
Subjective: Patient reports upper back and neck soreness. No acute events overnight.   Objective: Vital signs in last 24 hours: Temp:  [98.1 F (36.7 C)-98.8 F (37.1 C)] 98.1 F (36.7 C) (08/29 0350) Pulse Rate:  [78-84] 84 (08/29 0350) Resp:  [16-18] 16 (08/29 0350) BP: (124-134)/(48-55) 125/48 (08/29 0350) SpO2:  [92 %-100 %] 97 % (08/29 0350)  Intake/Output from previous day: 08/28 0701 - 08/29 0700 In: 1200 [P.O.:1200] Out: 1700 [Urine:1700] Intake/Output this shift: No intake/output data recorded.  Physical Exam: Patient is awake, A/O X 4 and conversant. They are in NAD and VSS. Doing well. Speech is fluent and appropriate. MAEW. Sensation to light touch is intact. PERLA, EOMI. CNs grossly intact. Dressing is clean dry intact. Incision is well approximated with no drainage, erythema, or edema.   Lab Results: No results for input(s): WBC, HGB, HCT, PLT in the last 72 hours. BMET No results for input(s): NA, K, CL, CO2, GLUCOSE, BUN, CREATININE, CALCIUM in the last 72 hours.  Studies/Results: No results found.  Assessment/Plan: 75 y.o. female with C1 Fracture with C1-2 instability following a fall is s/p Occ-C5 posterior instrumentation and fusion on 06/05/2021. She continues to have a moderate amount of occipital and posterior cervical pain with intermittent radiation into the upper back and bilateral shoulders, that is continuing to improve. Will discontinue IV pain medication and transition her to PO. Neuro exam is intact without any deficits. Continue working with therapies. Continue working on pain control, mobility and ambulating patient. Plan for CIR, awaiting placement.      LOS: 4 days     Marvis Moeller, DNP, NP-C 06/09/2021, 8:26 AM

## 2021-06-09 NOTE — Progress Notes (Signed)
Physical Therapy Treatment Patient Details Name: Becky Hobbs MRN: [id number] DOB: [birth year] Today's Date: 06/09/2021    History of Present Illness Patient is a 75 y/o female admitted with history of fall downstairs with anterior and posterior C1 ring fracture initially treated conservatively with c-collar, but with lateral listhesis on follow up imaging with pain and headaches. Patient underwent occiput to C5 fusion on 06/05/21.  PMH positive for arthritis, CKD, DM, GERD, HTN, back surgery x 2, hernia repair, breast surgeries.    PT Comments    Pt received in supine, drowsy initially but agreeable to therapy session with encouragement. Pt able to perform roll to L and log roll to EOB with mod/maxA, sit<>stand from elevated bed with modA and short gait task at bedside with +2 minA for safety. Pt gait distance/activity tolerance limited due to c/o pain and demonstrates slow processing/perseveration on pain throughout session. Emphasis on cx precautions (pt unable to state but compliant during session), safety with assistive device and transfers, fall risk precautions, supine/seated LE HEP and importance of mobility. Pt with good LE strength and anticipate with improved pain control pt will be able to progress gait distance next session. Pt continues to benefit from PT services to progress toward functional mobility goals.    Follow Up Recommendations  CIR;Supervision/Assistance - 24 hour     Equipment Recommendations  None recommended by PT    Recommendations for Other Services       Precautions / Restrictions Precautions Precautions: Fall;Cervical Precaution Comments: needs reminder for precautions 8/29; no brace needed Restrictions Weight Bearing Restrictions: No    Mobility  Bed Mobility Overal bed mobility: Needs Assistance Bed Mobility: Rolling;Sidelying to Sit Rolling: Max assist Sidelying to sit: Mod assist       General bed mobility comments: increased time, increased  pain. MaxA for rolling, modA bringing LEs of bed and trunk elevation with heavy cues for technique, pt drowsy and slow to initiate    Transfers Overall transfer level: Needs assistance Equipment used: Rolling walker (2 wheeled) Transfers: Sit to/from [facility] Sit to Stand: Mod assist;+2 safety/equipment;From elevated surface Stand pivot transfers: Min assist;+2 safety/equipment       General transfer comment: modA to rise and steady with increased time. Cues for hand placement. Patient taking pivotal steps with minA and increased time, second staff member present for safety but not needing to physically assist  Ambulation/Gait Ambulation/Gait assistance: Min assist;+2 safety/equipment Gait Distance (Feet): 8 Feet Assistive device: Rolling walker (2 wheeled) Gait Pattern/deviations: Step-to pattern;Decreased stride length;Shuffle Gait velocity: grossly <0.2 m/s Gait velocity interpretation: <1.8 ft/sec, indicate of risk for recurrent falls General Gait Details: lateral steps toward chair then forward/backward steps in front of chair, distance limited due to pt c/o severe neck pain      Balance Overall balance assessment: Needs assistance Sitting-balance support: Feet supported Sitting balance-Leahy Scale: Fair     Standing balance support: Bilateral upper extremity supported Standing balance-Leahy Scale: Poor Standing balance comment: requires UE support and external assist                  Cognition Arousal/Alertness: Awake/alert Behavior During Therapy: WFL for tasks assessed/performed Overall Cognitive Status: Within Functional Limits for tasks assessed               General Comments: Intact to conversation and able to follow simple directions, required increased time for processing. Pt perseverating on need to urinate and neck pain but was not able to urinate until she stood  to pivot to chair; purewick in place; may need briefs in future  sessions if purewick not donned      Exercises General Exercises - Lower Extremity Ankle Circles/Pumps: AROM;Both;10 reps;Supine Long Arc Quad: AROM;Both;5 reps;Seated Heel Slides: AROM;Both;10 reps;Supine Hip ABduction/ADduction: AROM;Both;10 reps;Supine Straight Leg Raises: AAROM;AROM;Both;10 reps;Supine    General Comments General comments (skin integrity, edema, etc.): BP 125/53 (74) initially upon sitting EOB (pt c/o mild dizziness); BP 131/52 seated in recliner post-exertion; HR 84-86 bpm      Pertinent Vitals/Pain Pain Assessment: 0-10 Pain Score: 8  (decreased to 6/10 once in chair) Pain Location: neck and B shoulders Pain Descriptors / Indicators: Aching;Tightness;Sore;Discomfort Pain Intervention(s): Limited activity within patient's tolerance;Monitored during session;Premedicated before session;Repositioned (pt had PO pain meds ~2 hrs prior (can have Q4H))     PT Goals (current goals can now be found in the care plan section) Acute Rehab PT Goals Patient Stated Goal: to be more independent Time For Goal Achievement: 06/20/21 Potential to Achieve Goals: Good Progress towards PT goals: Progressing toward goals    Frequency    Min 5X/week      PT Plan Current plan remains appropriate       AM-PAC PT "6 Clicks" Mobility   Outcome Measure  Help needed turning from your back to your side while in a flat bed without using bedrails?: A Lot Help needed moving from lying on your back to sitting on the side of a flat bed without using bedrails?: A Lot Help needed moving to and from a bed to a chair (including a wheelchair)?: A Lot Help needed standing up from a chair using your arms (e.g., wheelchair or bedside chair)?: A Lot Help needed to walk in hospital room?: A Lot Help needed climbing 3-5 steps with a railing? : Total 6 Click Score: 11    End of Session Equipment Utilized During Treatment: Gait belt Activity Tolerance: Patient limited by pain;Patient  limited by fatigue Patient left: in chair;with call bell/phone within reach;with chair alarm set Nurse Communication: Mobility status PT Visit Diagnosis: Other abnormalities of gait and mobility (R26.89);History of falling (Z91.81);Muscle weakness (generalized) (M62.81)     Time: 1829-9371 PT Time Calculation (min) (ACUTE ONLY): 33 min  Charges:  $Gait Training: 8-22 mins $Therapeutic Exercise: 8-22 mins                     Dovid Bartko P., PTA Acute Rehabilitation Services Pager: [phone] Office: [place] 06/09/2021, 11:39 AM

## 2021-06-09 NOTE — TOC Transition Note (Signed)
Transition of Care [facility]) - CM/SW Discharge Note   Patient Details  Name: Becky Hobbs MRN: [id number] Date of Birth: Apr 01, 1946  Transition of Care [facility]) CM/SW Contact:  Pollie Friar, RN Phone Number: 06/09/2021, 11:57 AM   Clinical Narrative:    Patient is discharging to CIR today. CM signing off.   Final next level of care: IP Rehab Facility Barriers to Discharge: No Barriers Identified   Patient Goals and CMS Choice     Choice offered to / list presented to : Patient  Discharge Placement                       Discharge Plan and Services                                     Social Determinants of Health (SDOH) Interventions     Readmission Risk Interventions No flowsheet data found.

## 2021-06-09 NOTE — Discharge Summary (Signed)
Physician Discharge Summary  Patient ID: Becky Hobbs MRN: [id number] DOB/AGE: 1946-06-29 75 y.o.  Admit date: 06/05/2021 Discharge date: 06/09/2021  Admission Diagnoses: C1 anterior/posterior ring fracture with lateral listhesis and instability, nonunion, Fall  Discharge Diagnoses: C1 anterior/posterior ring fracture with lateral listhesis and instability, nonunion, Fall  Active Problems:   Closed C1 fracture [facility])   Discharged Condition: good  Hospital Course: The patient was admitted on 06/05/2021 and taken to the operating room where the patient underwent Occ-C5 posterior instrumentation and fusion due to C1 Fracture with C1-2 instability following a fall. The patient tolerated the procedure well and was taken to the recovery room and then to the floor in stable condition. The hospital course was routine. There were no complications. The wound remained clean dry and intact. Pt had appropriate neck and upper back soreness. No complaints of arm pain or new N/T/W. The patient remained afebrile with stable vital signs, and tolerated a regular diet. The patient continued to increase activities, and pain was well controlled with oral pain medications.   Consults: None  Significant Diagnostic Studies: radiology: intraoperative x-ray and Airo (CT)   Treatments: surgery:  Occipital-C1, C1-C2, C2-C3, C3-C4, C4-C5 arthrodesis with occipital-cervical instrumentation (occipital keel plate, bilateral C2 pedicle screws, bilateral C4, bilateral C5 lateral mass screws; globus) Open treatment of progressive C1 fracture Intraoperative use of autograft, same incision Intraoperative use of allograft, Trinity 10 cc Intraoperative use of CT scan, airo Intraoperative use of neuro navigation, BrainLab Intraoperative use of fluoroscopy, less than 1 hour  Discharge Exam: Blood pressure (!) 132/54, pulse 88, temperature 98.1 F (36.7 C), temperature source Oral, resp. rate 16, height 5\' 4"  (1.626 m),  weight 102 kg, SpO2 97 %. Physical Exam: Patient is awake, A/O X 4 and conversant. They are in NAD and VSS. Doing well. Speech is fluent and appropriate. MAEW. Sensation to light touch is intact. PERLA, EOMI. CNs grossly intact. Dressing is clean dry intact. Incision is well approximated with no drainage, erythema, or edema.  Disposition: Discharge disposition: [facility]-Another [facility] Care Institution Not Defined       Discharge Instructions     Incentive spirometry RT   Complete by: As directed       Allergies as of 06/09/2021       Reactions   Bee Venom Anaphylaxis   Buprenorphine Hcl Hives, Itching   Erythromycin Hives, Itching   Other Anaphylaxis   bees   Penicillins Hives   Has patient had a PCN reaction causing immediate rash, facial/tongue/throat swelling, SOB or lightheadedness with hypotension: No Has patient had a PCN reaction causing severe rash involving mucus membranes or skin necrosis: No Has patient had a PCN reaction that required hospitalization: No Has patient had a PCN reaction occurring within the last 10 years: No If all of the above answers are "NO", then may proceed with Cephalosporin use.   Hydrocodone Itching   Morphine And Related Hives, Itching   Hives (moderate severity)   Tramadol Hcl Itching   Tramadol Hcl Itching   Hydrocodone-acetaminophen Itching   Pt says it causes hallucinations        Medication List     STOP taking these medications    HYDROcodone-acetaminophen 5-325 MG tablet Commonly known as: NORCO/VICODIN       TAKE these medications    Accu-Chek FastClix Lancets Misc 2 (two) times daily. for testing   acidophilus Caps capsule Take 1 capsule by mouth daily.   albuterol 108 (90 Base) MCG/ACT inhaler Commonly known as: VENTOLIN  HFA Inhale 2 puffs into the lungs every 6 (six) hours as needed for wheezing or shortness of breath.   allopurinol 100 MG tablet Commonly known as: ZYLOPRIM Take 100 mg by mouth daily.    ALPRAZolam 0.5 MG tablet Commonly known as: XANAX Take 0.5 mg by mouth 2 (two) times daily.   B-12 2500 MCG Subl Place 2,500 mcg under the tongue daily.   bismuth subsalicylate 604 VW/09WJ suspension Commonly known as: PEPTO BISMOL Take 30 mLs by mouth every 6 (six) hours as needed for indigestion or diarrhea or loose stools.   Cholecalciferol 125 MCG (5000 UT) Tabs Take 5,000 Units by mouth daily.   clotrimazole-betamethasone cream Commonly known as: LOTRISONE Apply 1 application topically 2 (two) times daily.   Co Q-10 200 MG Caps Take 200 mg by mouth daily.   EPINEPHrine 0.3 mg/0.3 mL Soaj injection Commonly known as: EpiPen 2-Pak Inject 0.3 mLs (0.3 mg total) into the skin once. What changed:  when to take this reasons to take this   fluticasone 50 MCG/ACT nasal spray Commonly known as: FLONASE Place 2 sprays into both nostrils in the morning and at bedtime.   fluticasone furoate-vilanterol 100-25 MCG/INH Aepb Commonly known as: BREO ELLIPTA Inhale 1 puff into the lungs at bedtime.   furosemide 20 MG tablet Commonly known as: LASIX Take 1 tablet (20 mg total) by mouth daily. Start in 2days on 5/8   gabapentin 300 MG capsule Commonly known as: NEURONTIN TAKE 1 CAPSULE BY MOUTH EVERY AM AND 2 CAPSULES EVERY NIGHT AT BEDTIME   Gemtesa 75 MG Tabs Generic drug: Vibegron Take by mouth.   hydrALAZINE 25 MG tablet Commonly known as: APRESOLINE Take 25 mg by mouth 3 (three) times daily as needed (blood pressure 140 or above).   hydrOXYzine 50 MG tablet Commonly known as: ATARAX/VISTARIL Take 50 mg by mouth 3 (three) times daily as needed for itching.   levothyroxine 100 MCG tablet Commonly known as: SYNTHROID Take 100 mcg by mouth daily before breakfast.   lidocaine 2 % solution Commonly known as: XYLOCAINE Use as directed 15 mLs in the mouth or throat every 6 (six) hours as needed (throat pain).   loratadine 10 MG tablet Commonly known as: CLARITIN Take  10 mg by mouth daily.   methylPREDNISolone 4 MG Tbpk tablet Commonly known as: MEDROL DOSEPAK Take as directed   metoprolol 200 MG 24 hr tablet Commonly known as: TOPROL-XL Take 200 mg by mouth daily. Take with or immediately following a meal.   mirabegron ER 50 MG Tb24 tablet Commonly known as: MYRBETRIQ Take 50 mg by mouth daily.   multivitamin with minerals Tabs tablet Take 1 tablet by mouth daily.   ondansetron 4 MG tablet Commonly known as: ZOFRAN Take 1 tablet (4 mg total) by mouth every 8 (eight) hours as needed for nausea.   oxyCODONE-acetaminophen 10-325 MG tablet Commonly known as: PERCOCET Take 1 tablet by mouth every 6 (six) hours as needed for moderate pain.   pantoprazole 40 MG tablet Commonly known as: PROTONIX Take 40 mg by mouth daily.   phentermine 37.5 MG capsule Take 37.5 mg by mouth daily.   Polyethyl Glycol-Propyl Glycol 0.4-0.3 % Gel ophthalmic gel Commonly known as: SYSTANE Place 1 application into both eyes at bedtime.   rOPINIRole 0.5 MG tablet Commonly known as: REQUIP Take 0.5 mg by mouth 3 (three) times daily as needed (cramping).   rosuvastatin 10 MG tablet Commonly known as: CRESTOR Take 10 mg by mouth daily.  SUMAtriptan 100 MG tablet Commonly known as: IMITREX TAKE 1 TABLET(100 MG) BY MOUTH 1 TIME AS NEEDED FOR MIGRAINE. MAY REPEAT IN 2 HOURS IF HEADACHE PERSISTS OR RECURS   Systane Complete 0.6 % Soln Generic drug: Propylene Glycol Place 1 drop into both eyes in the morning and at bedtime.   tiZANidine 4 MG tablet Commonly known as: ZANAFLEX Take 4 mg by mouth every 6 (six) hours as needed for muscle spasms.   tiZANidine 2 MG tablet Commonly known as: ZANAFLEX TAKE 1 TO 2 TABLETS BY MOUTH EVERY 6 HOURS AS NEEDED FOR SPASM   tiZANidine 2 MG tablet Commonly known as: ZANAFLEX TAKE 1 TO 2 TABLETS BY MOUTH EVERY 6 HOURS AS NEEDED FOR SPASM   torsemide 20 MG tablet Commonly known as: DEMADEX Take 20 mg by mouth daily.    Trulicity 1.5 TC/4.8LY Sopn Generic drug: Dulaglutide Inject 1.5 mg as directed every Sunday.   zolpidem 10 MG tablet Commonly known as: AMBIEN Take 10 mg by mouth at bedtime as needed for sleep.         Signed: Marvis Moeller, DNP, NP-C 06/09/2021, 10:37 AM

## 2021-06-09 NOTE — H&P (Signed)
Physical Medicine and Rehabilitation Admission H&P    CC:  Functional deficits.   HPI: Becky Hobbs is aa 75 year old RH female with history of CKD, migraines, cirrhosis, T2DM, anxiety d/o , fall 09/2020 with resultant C1 ring fracture treated with c collar but continued to have multiple falls with lateral listhesis, severe occipital HA and right neck pain. She was admitted on 06/05/21 for C1-C5 arthrodesis by Dr. Reatha Armour. Post op, she  continued to have limitation sdue to severe pain in upper back and shoulders affecting ADLs and mobility. CIR recommended due to functional decline.    Review of Systems  Constitutional:  Negative for chills and fever.  HENT:  Negative for hearing loss.   Eyes:  Negative for blurred vision.  Respiratory:  Negative for cough and shortness of breath.   Cardiovascular:  Positive for chest pain.  Gastrointestinal:  Negative for heartburn and nausea.  Musculoskeletal:  Positive for back pain, falls (multiple) and myalgias.  Skin:  Negative for rash.  Psychiatric/Behavioral:  Positive for memory loss.     Past Medical History:  Diagnosis Date   Anemia    Anxiety    panic attacks   Arthritis    Asthma    Blood transfusion    1973--with twins birth   Bronchitis    Chronic kidney disease    Diabetes mellitus    borderline   GERD (gastroesophageal reflux disease)    H/O hiatal hernia    surgery fixed   Headache    migraines   Heart murmur    MVP   Hepatitis    Hep C   Hypertension    Hypothyroidism    Pneumonia     Past Surgical History:  Procedure Laterality Date   ABDOMINAL HYSTERECTOMY     APPENDECTOMY     APPLICATION OF INTRAOPERATIVE CT SCAN N/A 06/05/2021   Procedure: APPLICATION OF INTRAOPERATIVE CT SCAN;  Surgeon: Dawley, Theodoro Doing, DO;  Location: [facility];  Service: Neurosurgery;  Laterality: N/A;   BACK SURGERY     bladder tack     BREAST CYST EXCISION     BREAST CYST INCISION AND DRAINAGE     BREAST SURGERY     BUNIONECTOMY      CHOLECYSTECTOMY     DILATION AND CURETTAGE OF UTERUS     ESOPHAGOGASTRODUODENOSCOPY (EGD) WITH PROPOFOL N/A 01/03/2019   Procedure: ESOPHAGOGASTRODUODENOSCOPY (EGD) WITH PROPOFOL;  Surgeon: Otis Brace, MD;  Location: [facility];  Service: Gastroenterology;  Laterality: N/A;   FOREIGN BODY REMOVAL  01/03/2019   Procedure: FOREIGN BODY REMOVAL;  Surgeon: Otis Brace, MD;  Location: [facility];  Service: Gastroenterology;;   FRACTURE SURGERY     left wrist   HERNIA REPAIR     hiatal hernia   LUMBAR FUSION  07/26/2017   LUMBAR LAMINECTOMY  03/04/2012   Procedure: MICRODISCECTOMY LUMBAR LAMINECTOMY;  Surgeon: Jessy Oto, MD;  Location: [facility];  Service: Orthopedics;  Laterality: N/A;  L3-4 central laminectomy   NASAL SEPTUM SURGERY     nissan fundoplication     OVARIAN CYST SURGERY     POSTERIOR CERVICAL FUSION/FORAMINOTOMY N/A 06/05/2021   Procedure: OCCIPITAL- CERVICAL TWO INSTRUMENTATION AND FUSION; EXTENSION TO CERVICAL FIVE;  Surgeon: Karsten Ro, DO;  Location: [facility];  Service: Neurosurgery;  Laterality: N/A;   THORACIC FUSION  07/26/2017   TONSILLECTOMY      Family History  Problem Relation Age of Onset   Kidney disease Mother    Vascular  Disease Mother     Social History: Widowed and lives alone. She reports that she has quit smoking. Her smoking use included cigarettes. She has never used smokeless tobacco. She reports that she does not drink alcohol and does not use drugs.   Allergies  Allergen Reactions   Bee Venom Anaphylaxis   Buprenorphine Hcl Hives and Itching   Erythromycin Hives and Itching   Other Anaphylaxis    bees   Penicillins Hives    Has patient had a PCN reaction causing immediate rash, facial/tongue/throat swelling, SOB or lightheadedness with hypotension: No Has patient had a PCN reaction causing severe rash involving mucus membranes or skin necrosis: No Has patient had a PCN reaction that required hospitalization: No Has  patient had a PCN reaction occurring within the last 10 years: No If all of the above answers are "NO", then may proceed with Cephalosporin use.   Hydrocodone Itching   Morphine And Related Hives and Itching    Hives (moderate severity)   Tramadol Hcl Itching   Tramadol Hcl Itching   Hydrocodone-Acetaminophen Itching    Pt says it causes hallucinations    Medications Prior to Admission  Medication Sig Dispense Refill   hydrOXYzine (ATARAX/VISTARIL) 50 MG tablet Take 50 mg by mouth 3 (three) times daily as needed for itching.     ACCU-CHEK FASTCLIX LANCETS MISC 2 (two) times daily. for testing  11   acidophilus (RISAQUAD) CAPS capsule Take 1 capsule by mouth daily.     albuterol (PROVENTIL HFA;VENTOLIN HFA) 108 (90 BASE) MCG/ACT inhaler Inhale 2 puffs into the lungs every 6 (six) hours as needed for wheezing or shortness of breath. (Patient not taking: Reported on 05/23/2021) 1 Inhaler 6   allopurinol (ZYLOPRIM) 100 MG tablet Take 100 mg by mouth daily.     ALPRAZolam (XANAX) 0.5 MG tablet Take 0.5 mg by mouth 2 (two) times daily.     bismuth subsalicylate (PEPTO BISMOL) 262 MG/15ML suspension Take 30 mLs by mouth every 6 (six) hours as needed for indigestion or diarrhea or loose stools.     Cholecalciferol 5000 units TABS Take 5,000 Units by mouth daily.      clotrimazole-betamethasone (LOTRISONE) cream Apply 1 application topically 2 (two) times daily.     Coenzyme Q10 (CO Q-10) 200 MG CAPS Take 200 mg by mouth daily.     Cyanocobalamin (B-12) 2500 MCG SUBL Place 2,500 mcg under the tongue daily.     EPINEPHrine (EPIPEN 2-PAK) 0.3 mg/0.3 mL IJ SOAJ injection Inject 0.3 mLs (0.3 mg total) into the skin once. (Patient taking differently: Inject 0.3 mg into the skin daily as needed (allergic reaction).) 2 Device 4   fluticasone (FLONASE) 50 MCG/ACT nasal spray Place 2 sprays into both nostrils in the morning and at bedtime.  5   fluticasone furoate-vilanterol (BREO ELLIPTA) 100-25 MCG/INH AEPB  Inhale 1 puff into the lungs at bedtime.     furosemide (LASIX) 20 MG tablet Take 1 tablet (20 mg total) by mouth daily. Start in 2days on 5/8 (Patient not taking: No sig reported)     gabapentin (NEURONTIN) 300 MG capsule TAKE 1 CAPSULE BY MOUTH EVERY AM AND 2 CAPSULES EVERY NIGHT AT BEDTIME 90 capsule 0   hydrALAZINE (APRESOLINE) 25 MG tablet Take 25 mg by mouth 3 (three) times daily as needed (blood pressure 140 or above).     levothyroxine (SYNTHROID) 100 MCG tablet Take 100 mcg by mouth daily before breakfast.     lidocaine (XYLOCAINE) 2 % solution  Use as directed 15 mLs in the mouth or throat every 6 (six) hours as needed (throat pain). (Patient not taking: No sig reported) 100 mL 0   loratadine (CLARITIN) 10 MG tablet Take 10 mg by mouth daily.     methylPREDNISolone (MEDROL DOSEPAK) 4 MG TBPK tablet Take as directed (Patient not taking: No sig reported) 21 tablet 0   metoprolol (TOPROL-XL) 200 MG 24 hr tablet Take 200 mg by mouth daily. Take with or immediately following a meal.     mirabegron ER (MYRBETRIQ) 50 MG TB24 tablet Take 50 mg by mouth daily.     Multiple Vitamin (MULITIVITAMIN WITH MINERALS) TABS Take 1 tablet by mouth daily.     ondansetron (ZOFRAN) 4 MG tablet Take 1 tablet (4 mg total) by mouth every 8 (eight) hours as needed for nausea. 30 tablet 6   oxyCODONE-acetaminophen (PERCOCET) 10-325 MG tablet Take 1 tablet by mouth every 6 (six) hours as needed for moderate pain.   0   pantoprazole (PROTONIX) 40 MG tablet Take 40 mg by mouth daily.     phentermine 37.5 MG capsule Take 37.5 mg by mouth daily.     Polyethyl Glycol-Propyl Glycol (SYSTANE) 0.4-0.3 % GEL ophthalmic gel Place 1 application into both eyes at bedtime.     Propylene Glycol (SYSTANE COMPLETE) 0.6 % SOLN Place 1 drop into both eyes in the morning and at bedtime.     rOPINIRole (REQUIP) 0.5 MG tablet Take 0.5 mg by mouth 3 (three) times daily as needed (cramping).     rosuvastatin (CRESTOR) 10 MG tablet Take 10  mg by mouth daily.      SUMAtriptan (IMITREX) 100 MG tablet TAKE 1 TABLET(100 MG) BY MOUTH 1 TIME AS NEEDED FOR MIGRAINE. MAY REPEAT IN 2 HOURS IF HEADACHE PERSISTS OR RECURS 12 tablet 11   tiZANidine (ZANAFLEX) 2 MG tablet TAKE 1 TO 2 TABLETS BY MOUTH EVERY 6 HOURS AS NEEDED FOR SPASM (Patient not taking: Reported on 05/23/2021) 771 tablet 3   tiZANidine (ZANAFLEX) 2 MG tablet TAKE 1 TO 2 TABLETS BY MOUTH EVERY 6 HOURS AS NEEDED FOR SPASM (Patient not taking: No sig reported) 60 tablet 0   tiZANidine (ZANAFLEX) 4 MG tablet Take 4 mg by mouth every 6 (six) hours as needed for muscle spasms.     torsemide (DEMADEX) 20 MG tablet Take 20 mg by mouth daily.     TRULICITY 1.5 ZO/1.0RU SOPN Inject 1.5 mg as directed every Sunday.  11   Vibegron (GEMTESA) 75 MG TABS Take by mouth.     zolpidem (AMBIEN) 10 MG tablet Take 10 mg by mouth at bedtime as needed for sleep.       Drug Regimen Review  Drug regimen was reviewed and remains appropriate with no significant issues identified  Home: Home Living Family/patient expects to be discharged to:: Private residence Living Arrangements: Alone Available Help at Discharge: Family, Available 24 hours/day Type of Home: House Home Access: Stairs to enter [employer] of Steps: 3 Entrance Stairs-Rails: Right, Left Home Layout: Two level, 1/2 bath on main level Alternate Level Stairs-Number of Steps: 14 Alternate Level Stairs-Rails: Left Bathroom Shower/Tub: Gaffer, Chiropodist: Handicapped height Bathroom Accessibility: Yes Home Equipment: Environmental consultant - 4 wheels, Shower seat, Radio producer - single point, Financial controller: Reacher, Sock aid, Long-handled shoe horn   Functional History: Prior Function Level of Independence: Independent with assistive device(s) Comments: Using single point cane before admittance   Functional Status:  Mobility: Bed  Mobility Overal bed mobility: Needs Assistance Bed  Mobility: Rolling, Sidelying to Sit Rolling: Mod assist Sidelying to sit: Mod assist General bed mobility comments: increased time, increased pain. ModA for rolling, bringing LEs of bed and trunk elevation Transfers Overall transfer level: Needs assistance Equipment used: Rolling walker (2 wheeled) Transfers: Sit to/from Stand, [employer] Transfers Sit to Stand: Mod assist Stand pivot transfers: Min assist General transfer comment: modA to rise and steady with increased time. Cues for hand placement. Patient taking pivotal steps with minA Ambulation/Gait Ambulation/Gait assistance: Min assist, +2 safety/equipment Gait Distance (Feet): 15 Feet Assistive device: Rolling walker (2 wheeled) Gait Pattern/deviations: Step-to pattern, Decreased stride length, Shuffle General Gait Details: deferred due to increased pain   ADL: ADL Overall ADL's : Needs assistance/impaired Eating/Feeding: Set up, Bed level Grooming: Oral care, Set up, Bed level Upper Body Bathing: Minimal assistance, Sitting Lower Body Bathing: Maximal assistance, Sit to/from stand Upper Body Dressing : Sitting, Adhering to UE precautions, Minimal assistance Upper Body Dressing Details (indicate cue type and reason): Donned robe EOB, pt required assistance threading arms through sleeves Lower Body Dressing: Maximal assistance, Adhering to back precautions, Sit to/from stand Toilet Transfer: Moderate assistance, +2 for physical assistance, RW, Ambulation Toilet Transfer Details (indicate cue type and reason): Required mod A for power up Toileting- Clothing Manipulation and Hygiene: Adhering to back precautions, Sit to/from stand, Moderate assistance Tub/ Shower Transfer: Maximal assistance, +2 for physical assistance, Rolling walker Functional mobility during ADLs: Min guard, Rolling walker General ADL Comments: Pt with decreased functional strength, ROM, activity tolerance, and with cervical precautions limiting self-care.    Cognition: Cognition Overall Cognitive Status: Within Functional Limits for tasks assessed Orientation Level: Oriented X4 Cognition Arousal/Alertness: Awake/alert Behavior During Therapy: WFL for tasks assessed/performed Overall Cognitive Status: Within Functional Limits for tasks assessed General Comments: Intact to conversation and able to follow simple directions, required increased time for processing.      Blood pressure (!) 127/42, pulse 88, temperature 97.9 F (36.6 C), temperature source Oral, resp. rate 18, height 5\' 4"  (1.626 m), SpO2 94 %. Physical Exam Constitutional:      Appearance: She is overweight.     Comments: Sleepy-- disoriented.    HENT:     Head: Contusion present.  Eyes:     Comments: Pupils pinpoint  Pulmonary:     Effort: Pulmonary effort is normal.  Abdominal:     General: There is no distension.     Palpations: There is mass.  Skin:    Comments: Posterior neck incision with honeycomb dressing- C/D/I. Incision without erythema or drainage Neurological:     Mental Status: She is oriented to person, place, and time and easily aroused. She is lethargic.     Comments: Flat affect with perseveration and tangential at times. Had difficulty keeping eyes open and fell asleep once when not stimulated. Delayed processing and slow to initiate. Had difficulty recalling when she fell and current month without max assist.  Moving all extremities. Sensation intact throughout.    Results for orders placed or performed during the hospital encounter of 06/09/21 (from the past 48 hour(s))  CBC with Differential/Platelet     Status: Abnormal   Collection Time: 06/09/21  1:53 PM  Result Value Ref Range   WBC 10.3 4.0 - 10.5 K/uL   RBC 3.72 (L) 3.87 - 5.11 MIL/uL   Hemoglobin 10.4 (L) 12.0 - 15.0 g/dL   HCT 33.0 (L) 36.0 - 46.0 %   MCV 88.7 80.0 - 100.0  fL   MCH 28.0 26.0 - 34.0 pg   MCHC 31.5 30.0 - 36.0 g/dL   RDW 14.8 11.5 - 15.5 %   Platelets 278 150 - 400 K/uL    nRBC 0.0 0.0 - 0.2 %   Neutrophils Relative % 73 %   Neutro Abs 7.6 1.7 - 7.7 K/uL   Lymphocytes Relative 13 %   Lymphs Abs 1.4 0.7 - 4.0 K/uL   Monocytes Relative 8 %   Monocytes Absolute 0.9 0.1 - 1.0 K/uL   Eosinophils Relative 5 %   Eosinophils Absolute 0.5 0.0 - 0.5 K/uL   Basophils Relative 0 %   Basophils Absolute 0.0 0.0 - 0.1 K/uL   Immature Granulocytes 1 %   Abs Immature Granulocytes 0.06 0.00 - 0.07 K/uL    Comment: Performed at [facility] [address], [place], [place] [address]   No results found.     Medical Problem List and Plan: 1.  Anterior and posterior C1 ring fracture s/p occiput to C5 fusion  -patient may shower but incision must be covered  -ELOS/Goals: 10-14 days S  -Admit to CIR 2.  Antithrombotics: -DVT/anticoagulation:  Mechanical: Sequential compression devices, below knee Bilateral lower extremities  -antiplatelet therapy: N/A 3. Pain Management: continue Oxycodone prn-->decrease to 5 mg prn.   --On Gabapentin 300 mg bid 4. Mood: LCSW to follow for evaluation and support.   -antipsychotic agents: N/a 5. Neuropsych: This patient is not fully capable of making decisions on her own behalf. 6. Skin/Wound Care: Monitor wound for healing.  7. Fluids/Electrolytes/Nutrition: Monitor I/O. Check lytes in am.  8. HTN: Monitor BID --on demadex, metoprolol,  9. CKD: Question baseline --SCr-3.06 at admission.  10.  Hypokalemia: K+ has been prgressing down--will recheck today and supplement 11. H/o panic attacks/anxiety d/o: Decrease Xanax to HS, continue Vistaril tid prn 12.  H/o asthma: Continue Breo.  13. T2DM: Hgb A1C-6.2. Well controlled on Tulicity every Sun-->-will have pt ask son to bring it from home.  --Monitor BS ac/hs. Carb modified diet.  14. 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 Follows with Dr. [facility]. Check ammonia level given confusion 15. Confusion s/p falls: CT head ordered.   I  have personally performed a face to face diagnostic evaluation, including, but not limited to relevant history and physical exam findings, of this patient and developed relevant assessment and plan.  Additionally, I have reviewed and concur with the physician assistant's documentation above.  Izora Ribas, MD 06/09/2021   Bary Leriche, PA-C

## 2021-06-10 ENCOUNTER — Inpatient Hospital Stay (HOSPITAL_COMMUNITY): Payer: Medicare Other

## 2021-06-10 DIAGNOSIS — S12001D Unspecified nondisplaced fracture of first cervical vertebra, subsequent encounter for fracture with routine healing: Secondary | ICD-10-CM | POA: Diagnosis not present

## 2021-06-10 LAB — COMPREHENSIVE METABOLIC PANEL
ALT: 75 U/L — ABNORMAL HIGH (ref 0–44)
AST: 120 U/L — ABNORMAL HIGH (ref 15–41)
Albumin: 2.5 g/dL — ABNORMAL LOW (ref 3.5–5.0)
Alkaline Phosphatase: 163 U/L — ABNORMAL HIGH (ref 38–126)
Anion gap: 14 (ref 5–15)
BUN: 40 mg/dL — ABNORMAL HIGH (ref 8–23)
CO2: 28 mmol/L (ref 22–32)
Calcium: 9.9 mg/dL (ref 8.9–10.3)
Chloride: 94 mmol/L — ABNORMAL LOW (ref 98–111)
Creatinine, Ser: 2.24 mg/dL — ABNORMAL HIGH (ref 0.44–1.00)
GFR, Estimated: 23 mL/min — ABNORMAL LOW (ref >60)
Glucose, Bld: 205 mg/dL — ABNORMAL HIGH (ref 70–99)
Potassium: 3.4 mmol/L — ABNORMAL LOW (ref 3.5–5.1)
Sodium: 136 mmol/L (ref 135–145)
Total Bilirubin: 1.2 mg/dL (ref 0.3–1.2)
Total Protein: 6.6 g/dL (ref 6.5–8.1)

## 2021-06-10 LAB — GLUCOSE, CAPILLARY
Glucose-Capillary: 216 mg/dL — ABNORMAL HIGH (ref 70–99)
Glucose-Capillary: 151 mg/dL — ABNORMAL HIGH (ref 70–99)
Glucose-Capillary: 102 mg/dL — ABNORMAL HIGH (ref 70–99)
Glucose-Capillary: 114 mg/dL — ABNORMAL HIGH (ref 70–99)

## 2021-06-10 MED ORDER — SORBITOL 70 % SOLN
60.0000 mL | Freq: Once | Status: AC
  Administered 2021-06-10: 60 mL via ORAL
  Filled 2021-06-10: qty 60

## 2021-06-10 MED ORDER — POTASSIUM CHLORIDE CRYS ER 20 MEQ PO TBCR
40.0000 meq | EXTENDED_RELEASE_TABLET | Freq: Once | ORAL | Status: AC
  Administered 2021-06-10: 40 meq via ORAL
  Filled 2021-06-10: qty 2

## 2021-06-10 MED ORDER — SORBITOL 70 % SOLN: 30.0000 mL | Freq: Once | Status: DC

## 2021-06-10 NOTE — Progress Notes (Signed)
Pt required intermittent catheterization    06/10/21 1840  Urine Characteristics  Urinary Interventions Intermittent/Straight cath  Bladder Scan Volume (mL) 380 mL  Intermittent/Straight Cath (mL) 400 mL  Hygiene Peri care

## 2021-06-10 NOTE — Progress Notes (Signed)
PROGRESS NOTE   Subjective/Complaints:  Pt c/o occipital HA mainly on R- also c/o nausea- and ears painful "sometimes"  doesn't feel like has ear infection.   Pt insists "original injury" was NOT December- was during her recent fall.  Says she has control of urine when pees- no BM in ~ 1 week- before her fall.   ROS:  Pt denies SOB, abd pain, CP, and vision changes- (+) as above    Objective:   CT HEAD WO CONTRAST (5MM)  Result Date: 06/10/2021 CLINICAL DATA:  Encephalopathy.  Falls. EXAM: CT HEAD WITHOUT CONTRAST TECHNIQUE: Contiguous axial images were obtained from the base of the skull through the vertex without intravenous contrast. COMPARISON:  None. FINDINGS: Brain: There is no mass, hemorrhage or extra-axial collection. There is generalized atrophy without lobar predilection. Hypodensity of the white matter is most commonly associated with chronic microvascular disease. Vascular: No abnormal hyperdensity of the major intracranial arteries or dural venous sinuses. No intracranial atherosclerosis. Skull: Craniocervical fusion. Small right parietal scalp hematoma. No skull fracture. Sinuses/Orbits: No fluid levels or advanced mucosal thickening of the visualized paranasal sinuses. No mastoid or middle ear effusion. The orbits are normal. IMPRESSION: 1. No acute intracranial abnormality. 2. Normal aging brain. 3. Small right parietal scalp hematoma. Electronically Signed   By: Ulyses Jarred M.D.   On: 06/10/2021 01:34   Recent Labs    06/09/21 1353  WBC 10.3  HGB 10.4*  HCT 33.0*  PLT 278   Recent Labs    06/09/21 1353 06/10/21 0514  NA 134* 136  K 3.5 3.4*  CL 95* 94*  CO2 29 28  GLUCOSE 122* 205*  BUN 35* 40*  CREATININE 2.06* 2.24*  CALCIUM 9.2 9.9    Intake/Output Summary (Last 24 hours) at 06/10/2021 0856 Last data filed at 06/10/2021 0700 Gross per 24 hour  Intake 240 ml  Output --  Net 240 ml         Physical Exam: Vital Signs Blood pressure (!) 126/41, pulse 95, temperature 98.4 F (36.9 C), temperature source Oral, resp. rate 18, height 5\' 4"  (1.626 m), weight 103.4 kg, SpO2 94 %.     General: awake, alert, appropriate, irritable- likely due to pain; c/o HA; PT in room;  NAD HENT: conjugate gaze; oropharynx moist; honeycomb dressing on back of neck CV: regular rate- on higher end; ; no JVD Pulmonary: CTA B/L; no W/R/R- good air movement GI: firmish- esp upper abdomen; no rebound; NT; distended; hypoactive Psychiatric: appropriate Neurological: alert, but appears a little confused.    Assessment/Plan: 1. Functional deficits which require 3+ hours per day of interdisciplinary therapy in a comprehensive inpatient rehab setting. Physiatrist is providing close team supervision and 24 hour management of active medical problems listed below. Physiatrist and rehab team continue to assess barriers to discharge/monitor patient progress toward functional and medical goals  Care Tool:  Bathing              Bathing assist       Upper Body Dressing/Undressing Upper body dressing   What is the patient wearing?: Hospital gown only    Upper body assist Assist Level: Minimal Assistance - Patient >  75%    Lower Body Dressing/Undressing Lower body dressing            Lower body assist       Toileting Toileting    Toileting assist Assist for toileting: Moderate Assistance - Patient 50 - 74%     Transfers Chair/bed transfer  Transfers assist     Chair/bed transfer assist level: Minimal Assistance - Patient > 75%     Locomotion Ambulation   Ambulation assist              Walk 10 feet activity   Assist           Walk 50 feet activity   Assist           Walk 150 feet activity   Assist           Walk 10 feet on uneven surface  activity   Assist           Wheelchair     Assist               Wheelchair 50  feet with 2 turns activity    Assist            Wheelchair 150 feet activity     Assist          Blood pressure (!) 126/41, pulse 95, temperature 98.4 F (36.9 C), temperature source Oral, resp. rate 18, height 5\' 4"  (1.626 m), weight 103.4 kg, SpO2 94 %.  Medical Problem List and Plan: 1.  Anterior and posterior C1 ring fracture s/p occiput to C5 fusion             -patient may shower but incision must be covered             -ELOS/Goals: 10-14 days S             -Admit to CIR  -first day of evaluations- will do ASIA exam Thursday 2.  Antithrombotics: -DVT/anticoagulation:  Mechanical: Sequential compression devices, below knee Bilateral lower extremities             -antiplatelet therapy: N/A 3. Pain Management: continue Oxycodone prn-->decrease to 5 mg prn.              --On Gabapentin 300 mg bid  8/30- pt reports a HA this AM, but reports Oxy helps- doesn't want higher dose- ?makes her hallucinate". Will con't current dosing 4. Mood: LCSW to follow for evaluation and support.              -antipsychotic agents: N/a 5. Neuropsych: This patient is not fully capable of making decisions on her own behalf. 6. Skin/Wound Care: Monitor wound for healing.  7. Fluids/Electrolytes/Nutrition: Monitor I/O. Check lytes in am.  8. HTN: Monitor BID --on demadex, metoprolol,  9. CKD: Question baseline --SCr-3.06 at admission.   8/30- pt's Cr up from 2.08 to 2.24- will encourage fluids and recheck in AM- wait on d/c'ing IV since Cr is up 10.  Hypokalemia: K+ has been progressing down--will recheck today and supplement  8/30- K+ 3.4 this AM- will replete and recheck in AM 11. H/o panic attacks/anxiety d/o: Decrease Xanax to HS, continue Vistaril tid prn 12.  H/o asthma: Continue Breo.  13. T2DM: Hgb A1C-6.2. Well controlled on Tulicity every Sun-->-will have pt ask son to bring it from home.  --Monitor BS ac/hs. Carb modified diet.  14. 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 Follows with Dr[facility]. Check ammonia level given confusion 15. Confusion s/p falls: CT head ordered.    8/30- CT was OK, except "aging brain" and small R parietal scalp hematoma- will monitor   LOS: 1 days A FACE TO FACE EVALUATION WAS PERFORMED  Maleta Pacha 06/10/2021, 8:56 AM

## 2021-06-10 NOTE — Progress Notes (Signed)
Patient ID: ELINORA WEIGAND, female   DOB: 01/22/1946, 75 y.o.   MRN: [id number]  SW met with pt in room to introduce self, explain role, and discuss discharge process. SW informed on ELOS 2 weeks. Pt was on phone with her sister [name] as SW provided updates. Reports her sister [name] will be with her 24/7 for two weeks initially at d/c, and then her son Lennette Bihari (lives in [place]) will assist. SW informed will continue to provide updates from team conference.   Loralee Pacas, MSW, [place] Office: [phone] Cell: [phone] Fax: [phone]

## 2021-06-10 NOTE — Patient Care Conference (Signed)
Inpatient RehabilitationTeam Conference and Plan of Care Update Date: 06/10/2021   Time: 11:25 AM    Patient Name: Becky Hobbs      Medical Record Number: [id number]  Date of Birth: 30-Aug-1946 Sex: Female         Room/Bed: 4W19C/4W19C-01 Payor Info: Payor: MEDICARE / Plan: MEDICARE PART A AND B / Product Type: *No Product type* /    Admit Date/Time:  06/09/2021 12:42 PM  Primary Diagnosis:  Fracture of C1 vertebra, closed [place])  Hospital Problems: Principal Problem:   Fracture of C1 vertebra, closed [facility])    Expected Discharge Date: Expected Discharge Date:  (2 weeks)  Team Members Present: Physician leading conference: Dr. Courtney Heys Social Worker Present: Loralee Pacas, [place] Nurse Present: Dorthula Nettles, RN PT Present: Excell Seltzer, PT OT Present: Roanna Epley, [place], OT PPS Coordinator present : Gunnar Fusi, SLP     Current Status/Progress Goal Weekly Team Focus  Bowel/Bladder   Pt is cont of B/B. LBM is PTS 06/05/21  Pt remain cont of B/B  [place] pt as needed   Swallow/Nutrition/ Hydration             ADL's   OT evaluation pending  OT evaluation pending      Mobility   PT Eval pending  PT Eval pending  PT Eval pending   Communication             Safety/Cognition/ Behavioral Observations            Pain   Pt denies pain  Pt remain pain free  Assess pt for pain qshift and provide PRN as needed   Skin   Pt has a surgical incison on back of neck with honeycomb dressing  skin remain clean and free of injury  Assess skin qshift     Discharge Planning:  To be assessed. per EMR, pt to d/c to her home and will have assistance from her sister. SW will confirm no barriers to d/c.   Team Discussion: No BM in a week, distended abdomen, Sorbitol ordered. CR elevated, push fluids. Has confusion may be due to decreased Ammonia level. Incontinent Bladder, no BM reported. On-going pain, incision to neck and back. Nursing to do PVR's. Limited by  pain and nausea. Has good strength in legs. PT and OT eval pending. Patient on target to meet rehab goals: Evaluations still pending.  *See Care Plan and progress notes for long and short-term goals.   Revisions to Treatment Plan:  Sorbitol ordered for BM. Teaching Needs: Family education, medication management, pain management, bowel/bladder management, skin/wound care, transfer training, gait training, balance training, endurance training, safety awareness.  Current Barriers to Discharge: Decreased caregiver support, Medical stability, Home enviroment access/layout, Incontinence, Neurogenic bowel and bladder, Wound care, Weight, Weight bearing restrictions, Medication compliance, and Behavior  Possible Resolutions to Barriers: Continue current medications, provide emotional support.     Medical Summary Current Status: LBM ~ 1 week ago; incontinent of bladder; pain is ongoing/a issue- HA and neck pain; has incision on neck;  Barriers to Discharge: Other (comments);Behavior;Decreased family/caregiver support;Home Dentist;Incontinence;Neurogenic Bowel & Bladder;Medical stability;Weight;Weight bearing restrictions;Wound care  Barriers to Discharge Comments: sister to help- lived a lone Possible Resolutions to [employer]: limited by pain/nausea; so much pain, used bedpan; OT eval right now; somewhat confused; due to cirrhoisis; d/c ~ 2 weeks- re-eval next week; - supervision goals; will she need cathing or bowel program? not clear yet.   Continued Need for Acute Rehabilitation Level  of Care: The patient requires daily medical management by a physician with specialized training in physical medicine and rehabilitation for the following reasons: Direction of a multidisciplinary physical rehabilitation program to maximize functional independence : Yes Medical management of patient stability for increased activity during participation in an intensive rehabilitation  regime.: Yes Analysis of laboratory values and/or radiology reports with any subsequent need for medication adjustment and/or medical intervention. : Yes   I attest that I was present, lead the team conference, and concur with the assessment and plan of the team.   Cristi Loron 06/10/2021, 5:02 PM

## 2021-06-10 NOTE — Evaluation (Signed)
Occupational Therapy Assessment and Plan  Patient Details  Name: Becky Hobbs MRN: [id number] Date of Birth: November 17, 1945  OT Diagnosis: acute pain, muscle weakness (generalized), and pain in joint Rehab Potential: Rehab Potential (ACUTE ONLY): Good ELOS: 12-14 days   Today's Date: 06/10/2021 OT Individual Time: 1045-1200 OT Individual Time Calculation (min): 75 min     Hospital Problem: Principal Problem:   Fracture of C1 vertebra, closed ([place])   Past Medical History:  Past Medical History:  Diagnosis Date   Anemia    Anxiety    panic attacks   Arthritis    Asthma    Blood transfusion    1973--with twins birth   Bronchitis    Chronic kidney disease    Diabetes mellitus    borderline   GERD (gastroesophageal reflux disease)    H/O hiatal hernia    surgery fixed   Headache    migraines   Heart murmur    MVP   Hepatitis    Hep C   Hypertension    Hypothyroidism    Pneumonia    Past Surgical History:  Past Surgical History:  Procedure Laterality Date   ABDOMINAL HYSTERECTOMY     APPENDECTOMY     APPLICATION OF INTRAOPERATIVE CT SCAN N/A 06/05/2021   Procedure: APPLICATION OF INTRAOPERATIVE CT SCAN;  Surgeon: Dawley, Theodoro Doing, DO;  Location: [place];  Service: Neurosurgery;  Laterality: N/A;   BACK SURGERY     bladder tack     BREAST CYST EXCISION     BREAST CYST INCISION AND DRAINAGE     BREAST SURGERY     BUNIONECTOMY     CHOLECYSTECTOMY     DILATION AND CURETTAGE OF UTERUS     ESOPHAGOGASTRODUODENOSCOPY (EGD) WITH PROPOFOL N/A 01/03/2019   Procedure: ESOPHAGOGASTRODUODENOSCOPY (EGD) WITH PROPOFOL;  Surgeon: Otis Brace, MD;  Location: [place];  Service: Gastroenterology;  Laterality: N/A;   FOREIGN BODY REMOVAL  01/03/2019   Procedure: FOREIGN BODY REMOVAL;  Surgeon: Otis Brace, MD;  Location: [place];  Service: Gastroenterology;;   FRACTURE SURGERY     left wrist   HERNIA REPAIR     hiatal hernia   LUMBAR FUSION  07/26/2017    LUMBAR LAMINECTOMY  03/04/2012   Procedure: MICRODISCECTOMY LUMBAR LAMINECTOMY;  Surgeon: Jessy Oto, MD;  Location: [place];  Service: Orthopedics;  Laterality: N/A;  L3-4 central laminectomy   NASAL SEPTUM SURGERY     nissan fundoplication     OVARIAN CYST SURGERY     POSTERIOR CERVICAL FUSION/FORAMINOTOMY N/A 06/05/2021   Procedure: OCCIPITAL- CERVICAL TWO INSTRUMENTATION AND FUSION; EXTENSION TO CERVICAL FIVE;  Surgeon: Karsten Ro, DO;  Location: [place];  Service: Neurosurgery;  Laterality: N/A;   THORACIC FUSION  07/26/2017   TONSILLECTOMY      Assessment & Plan Clinical Impression:     Becky Hobbs is aa 75 year old RH female with history of CKD, migraines, cirrhosis, T2DM, anxiety d/o , fall 09/2020 with resultant C1 ring fracture treated with c collar but continued to have multiple falls with lateral listhesis, severe occipital HA and right neck pain. She was admitted on 06/05/21 for C1-C5 arthrodesis by Dr. Reatha Armour. Post op, she  continued to have limitation sdue to severe pain in upper back and shoulders affecting ADLs and mobility. CIR recommended due to functional decline.   Patient transferred to CIR on 06/09/2021 .    Patient currently requires max with basic self-care skills secondary to muscle weakness, decreased cardiorespiratoy endurance,  impaired timing and sequencing, unbalanced muscle activation, and decreased coordination, decreased problem solving, decreased memory, and delayed processing, and decreased sitting balance, decreased standing balance, decreased postural control, and decreased balance strategies.  Prior to hospitalization, patient could complete ADLs with modified independent .  Patient will benefit from skilled intervention to increase independence with basic self-care skills prior to discharge home with care partner.  Anticipate patient will require intermittent supervision and follow up home health.  OT - End of Session Activity Tolerance: Tolerates 10 -  20 min activity with multiple rests Endurance Deficit: Yes Endurance Deficit Description: frequent rest breaks during functional activity OT Assessment Rehab Potential (ACUTE ONLY): Good OT Patient demonstrates impairments in the following area(s): Balance;Cognition;Endurance;Pain;Motor OT Basic ADL's Functional Problem(s): Grooming;Bathing;Dressing;Toileting OT Transfers Functional Problem(s): Toilet;Tub/Shower OT Additional Impairment(s): Fuctional Use of Upper Extremity OT Plan OT Intensity: Minimum of 1-2 x/day, 45 to 90 minutes OT Frequency: 5 out of 7 days OT Duration/Estimated Length of Stay: 12-14 days OT Treatment/Interventions: Balance/vestibular training;Cognitive remediation/compensation;Discharge planning;DME/adaptive equipment instruction;Functional mobility training;Psychosocial support;UE/LE Strength taining/ROM;UE/LE Coordination activities;Therapeutic Exercise;Self Care/advanced ADL retraining;Patient/family education;Neuromuscular re-education OT Self Feeding Anticipated Outcome(s): independent OT Basic Self-Care Anticipated Outcome(s): supervision OT Toileting Anticipated Outcome(s): mod I OT Bathroom Transfers Anticipated Outcome(s): supervision OT Recommendation Patient destination: Home Follow Up Recommendations: Home health OT Equipment Recommended: To be determined   OT Evaluation Precautions/Restrictions  Precautions Precautions: Fall;Cervical Precaution Comments: needs reminder for precautions 8/29; no brace needed Restrictions Weight Bearing Restrictions: No Pain Pain Assessment Pain Scale: 0-10 Pain Score: 8  Pain Type: Acute pain;Surgical pain Pain Location: Neck Pain Orientation: Posterior Pain Descriptors / Indicators: Aching Pain Onset: On-going Pain Intervention(s): Rest;Repositioned Home Living/Prior Functioning Home Living Family/patient expects to be discharged to:: Private residence Living Arrangements: Alone Available Help at  Discharge: Family, Available 24 hours/day Type of Home: House Home Access: Stairs to enter Technical brewer of Steps: 4 Entrance Stairs-Rails: Right, Left (cannot reach both) Home Layout: Two level, 1/2 bath on main level Alternate Level Stairs-Number of Steps: 14 Alternate Level Stairs-Rails: Left Bathroom Shower/Tub: Gaffer, Chiropodist: Handicapped height Bathroom Accessibility: Yes Additional Comments: sister staying with her for now per pt report  Lives With: Family Prior Function Level of Independence: Independent with gait, Independent with transfers, Requires assistive device for independence  Able to Take Stairs?: Yes Driving: No Comments: used SPC and/or RW prior to admission Vision Baseline Vision/History: 1 Wears glasses Ability to See in Adequate Light: 0 Adequate Patient Visual Report: No change from baseline Vision Assessment?: No apparent visual deficits Perception  Perception: Within Functional Limits Praxis Praxis: Intact Cognition Overall Cognitive Status: Within Functional Limits for tasks assessed Arousal/Alertness: Lethargic Orientation Level: Person;Place;Situation Person: Oriented Place: Oriented Situation: Oriented Year: 2022 Month: August Day of Week: Correct Memory: Impaired Memory Impairment: Decreased long term memory Immediate Memory Recall: Sock;Bed;Blue Memory Recall Sock: Without Cue Memory Recall Blue: Without Cue Memory Recall Bed: Without Cue Attention: Focused Focused Attention: Appears intact Awareness: Appears intact Problem Solving: Appears intact Safety/Judgment: Appears intact Sensation Sensation Light Touch: Appears Intact Hot/Cold: Appears Intact Proprioception: Appears Intact Stereognosis: Appears Intact Coordination Gross Motor Movements are Fluid and Coordinated: No Fine Motor Movements are Fluid and Coordinated: No Coordination and Movement Description: impaired 2/2 pain Finger  Nose Finger Test: not tested due to pain with reaching arms forward Motor  Motor Motor: Abnormal postural alignment and control Motor - Skilled Clinical Observations: impaired 2/2 pain  Trunk/Postural Assessment  Cervical Assessment Cervical Assessment: Exceptions to [facility] (limited  ROM due to pain and cervical precautions) Thoracic Assessment Thoracic Assessment: Exceptions to [facility] (rounded shoulders) Lumbar Assessment Lumbar Assessment: Exceptions to [facility] (posterior pelvic tilt) Postural Control Postural Control: Deficits on evaluation  Balance Balance Balance Assessed: Yes Static Sitting Balance Static Sitting - Balance Support: Bilateral upper extremity supported;Feet supported Static Sitting - Level of Assistance: 4: Min assist Dynamic Sitting Balance Dynamic Sitting - Balance Support: Bilateral upper extremity supported;Feet supported;During functional activity Dynamic Sitting - Level of Assistance: 4: Min Insurance risk surveyor Standing - Balance Support: Bilateral upper extremity supported;During functional activity Static Standing - Level of Assistance: 4: Min assist Extremity/Trunk Assessment RUE Assessment Passive Range of Motion (PROM) Comments: 160 sh flexion Active Range of Motion (AROM) Comments: 120 sh flexion - limited horizontal add to reach across body General Strength Comments: 3-/5 sh, 4-/5 elbow and grasp LUE Assessment Passive Range of Motion (PROM) Comments: 160 sh flex Active Range of Motion (AROM) Comments: 110 sh flexion, limited horizontal add to reach across body General Strength Comments: 3-/5 sh, 4-/5 elbow and grasp  Care Tool Care Tool Self Care Eating   Eating Assist Level: Set up assist    Oral Care    Oral Care Assist Level: Set up assist    Bathing   Body parts bathed by patient: Chest;Abdomen;Front perineal area;Left arm;Right arm;Left upper leg;Right upper leg;Face Body parts bathed by helper: Buttocks;Left lower leg;Right  lower leg   Assist Level: Moderate Assistance - Patient 50 - 74%    Upper Body Dressing(including orthotics)   What is the patient wearing?: Pull over shirt   Assist Level: Maximal Assistance - Patient 25 - 49%    Lower Body Dressing (excluding footwear)   What is the patient wearing?: Pants;Incontinence brief Assist for lower body dressing: Maximal Assistance - Patient 25 - 49%    Putting on/Taking off footwear   What is the patient wearing?: Non-skid slipper socks Assist for footwear: Total Assistance - Patient < 25%       Care Tool Toileting Toileting activity   Assist for toileting: Maximal Assistance - Patient 25 - 49%     Care Tool Bed Mobility Roll left and right activity   Roll left and right assist level: Maximal Assistance - Patient 25 - 49%    Sit to lying activity   Sit to lying assist level: Moderate Assistance - Patient 50 - 74%    Lying to sitting on side of bed activity   Lying to sitting on side of bed assist level: the ability to move from lying on the back to sitting on the side of the bed with no back support.: Moderate Assistance - Patient 50 - 74%     Care Tool Transfers Sit to stand transfer   Sit to stand assist level: Moderate Assistance - Patient 50 - 74%    Chair/bed transfer Chair/bed transfer activity did not occur: Refused Chair/bed transfer assist level: Minimal Assistance - Patient > 75%     Toilet transfer Toilet transfer activity did not occur: Refused Assist Level: Minimal Assistance - Patient > 75%     Care Tool Cognition  Expression of Ideas and Wants Expression of Ideas and Wants: 3. Some difficulty - exhibits some difficulty with expressing needs and ideas (e.g, some words or finishing thoughts) or speech is not clear  Understanding Verbal and Non-Verbal Content Understanding Verbal and Non-Verbal Content: 3. Usually understands - understands most conversations, but misses some part/intent of message. Requires cues at times to  understand   Memory/Recall Ability Memory/Recall Ability : Current season;That he or she is in a hospital/hospital unit   Refer to Care Plan for [place] 1 OT Short Term Goal 1 (Week 1): Pt will be able to manage clothing over hips with S pre toileting and slight A post toileting. OT Short Term Goal 2 (Week 1): Pt will sit to stand from [facility] or toilet with S. OT Short Term Goal 3 (Week 1): Pt will don shirt with set up. OT Short Term Goal 4 (Week 1): Pt will have improved sh ROM to reach across chest to wash under her arms more easily.  Recommendations for other services: None    Skilled Therapeutic Intervention  ADL ADL Eating: Set up Grooming: Setup Upper Body Bathing: Supervision/safety Where Assessed-Upper Body Bathing: Chair Lower Body Bathing: Moderate assistance Where Assessed-Lower Body Bathing: Chair Upper Body Dressing: Maximal assistance Where Assessed-Upper Body Dressing: Chair Lower Body Dressing: Maximal assistance Where Assessed-Lower Body Dressing: Chair Toileting: Maximal assistance Where Assessed-Toileting: Bedside Commode Toilet Transfer: Minimal assistance Toilet Transfer Method: Stand pivot Toilet Transfer Equipment: Bedside commode Mobility  Bed Mobility Bed Mobility: Rolling Right;Rolling Left;Supine to Sit;Sit to Supine Rolling Right: Maximal Assistance - Patient 25-49% Rolling Left: Maximal Assistance - Patient 25-49% Supine to Sit: Moderate Assistance - Patient 50-74% Sit to Supine: Moderate Assistance - Patient 50-74% Transfers Sit to Stand: Moderate Assistance - Patient 50-74% Stand to Sit: Moderate Assistance - Patient 50-74%  Pt seen for initial evaluation and ADL training. Pt received in bed sleeping but awoke fairly easily. Pt continued to be lethargic and stated she was still in pain but agreeable to trying therapy.  Pt sat to EOB with mod A.  Once sitting she was able to hold her balance. Pt agreeable to sitting  on BSC to engage in bathing, pt able to urinate on BSC but a small amount.  After dressing with max A she needed to urinate a second time but another small amount.  Pt able to cleanse self in front.  Sat back to EOB and opted to lay down vs sitting in wc due to increased neck pain.  Discussed role of OT, pt's goals, pt kept repeating " I cant remember exactly when or how I broke my neck". She was processing slowly and demonstrated some problem solving challenges but very likely due to pain, lethargy and medications.  Pt resting in bed at end of session with all needs met.  Bed alarm set.    Discharge Criteria: Patient will be discharged from OT if patient refuses treatment 3 consecutive times without medical reason, if treatment goals not met, if there is a change in medical status, if patient makes no progress towards goals or if patient is discharged from hospital.  The above assessment, treatment plan, treatment alternatives and goals were discussed and mutually agreed upon: by patient  [facility] 06/10/2021, 12:25 PM

## 2021-06-10 NOTE — Progress Notes (Signed)
Physical Therapy Session Note  Patient Details  Name: Becky Hobbs MRN: [id number] Date of Birth: 07-08-1946  Today's Date: 06/10/2021 PT Individual Time: 1630-1710 PT Individual Time Calculation (min): 40 min   Short Term Goals: Week 1:  PT Short Term Goal 1 (Week 1): Pt will perform least restrictive transfer with min A consistently PT Short Term Goal 2 (Week 1): Pt will initiate gait training as safe and able PT Short Term Goal 3 (Week 1): Pt will tolerate sitting OOB x 1 hour PT Short Term Goal 4 (Week 1): Pt will initiate stair training as safe and able  Skilled Therapeutic Interventions/Progress Updates:    Pt received seated in bed, agreeable to PT session. Pt reports 8/10 pain in her neck and shoulders, premedicated prior to start of therapy session. Supine to sit with mod A for trunk control. Pt reports urge to use the bathroom, declines to use BSC and requests to ambulate into bathroom to toilet. Sit to stand with mod A to RW. Ambulation to/from bathroom x 15 ft with RW and mod A for balance. Toilet transfer with mod A. Pt found to be incontinent of bowel in her brief, able to further void while seated on toilet. Pt is initially setup A for pericare, requires max A for thoroughness. Pt able to pull pants down with min A, dependent to don new brief and max A to pull pants back over hips. Standing balance while washing hands at sink with min A. Pt returned to bed with mod A for BLE management. Pt left seated in bed with needs in reach, bed alarm in place.  Therapy Documentation Precautions:  Precautions Precautions: Fall, Cervical Precaution Comments: needs reminder for precautions 8/29; no brace needed Restrictions Weight Bearing Restrictions: No   Therapy/Group: Individual Therapy   Excell Seltzer, PT, DPT, CSRS  06/10/2021, 5:29 PM

## 2021-06-10 NOTE — Progress Notes (Signed)
Inpatient Rehabilitation Medication Review by a Pharmacist  A complete drug regimen review was completed for this patient to identify any potential clinically significant medication issues.  High Risk Drug Classes Is patient taking? Indication by Medication  Antipsychotic No   Anticoagulant No   Antibiotic No   Opioid Yes Severe pain for C1 fx s/p surgery  Antiplatelet No   Hypoglycemics/insulin Yes SSI, Dulaglutide for DM  Vasoactive Medication Yes Toprol, Demadex for HTN,   Chemotherapy No   Other Yes Xanax BID for anixiety and panic attacks, Requip PRN cramping     Type of Medication Issue Identified Description of Issue Recommendation(s)  Drug Interaction(s) (clinically significant)     Duplicate Therapy     Allergy     No Medication Administration End Date     Incorrect Dose  Gabapentin 300mg  BID Gabapentin 300mg  daily and 600mg /hs  Additional Drug Therapy Needed  Synthroid 188mcg/d Claritin 10mg /d Resume orders  Significant med changes from prior encounter (inform family/care partners about these prior to discharge).    Other  Spoke to pt about brining in Trulicity from home F/u for pt's son to bring in med.    Clinically significant medication issues were identified that warrant physician communication and completion of prescribed/recommended actions by midnight of the next day:  Yes  Name of provider notified for urgent issues identified: [place]  Provider Method of Notification: Chat  Pharmacist comments:  Update:  8/30: Synthroid and Claritin added yesterday afternoon No change in Gabapentin per Pam: we hope to wean it down or d/c if mentation does not improve  Time spent performing this drug regimen review (minutes):  10min  Samaia Iwata S. Alford Highland, PharmD, BCPS Clinical Staff Pharmacist [email or web address] [place] 06/10/2021 8:14 AM

## 2021-06-10 NOTE — Plan of Care (Signed)
  Problem: RH Balance Goal: LTG Patient will maintain dynamic sitting balance (PT) Description: LTG:  Patient will maintain dynamic sitting balance with assistance during mobility activities (PT) Flowsheets (Taken 06/10/2021 1212) LTG: Pt will maintain dynamic sitting balance during mobility activities with:: Independent with assistive device  Goal: LTG Patient will maintain dynamic standing balance (PT) Description: LTG:  Patient will maintain dynamic standing balance with assistance during mobility activities (PT) Flowsheets (Taken 06/10/2021 1212) LTG: Pt will maintain dynamic standing balance during mobility activities with:: Supervision/Verbal cueing   Problem: Sit to Stand Goal: LTG:  Patient will perform sit to stand with assistance level (PT) Description: LTG:  Patient will perform sit to stand with assistance level (PT) Flowsheets (Taken 06/10/2021 1212) LTG: PT will perform sit to stand in preparation for functional mobility with assistance level: Supervision/Verbal cueing   Problem: RH Bed Mobility Goal: LTG Patient will perform bed mobility with assist (PT) Description: LTG: Patient will perform bed mobility with assistance, with/without cues (PT). Flowsheets (Taken 06/10/2021 1212) LTG: Pt will perform bed mobility with assistance level of: Independent with assistive device    Problem: RH Bed to Chair Transfers Goal: LTG Patient will perform bed/chair transfers w/assist (PT) Description: LTG: Patient will perform bed to chair transfers with assistance (PT). Flowsheets (Taken 06/10/2021 1212) LTG: Pt will perform Bed to Chair Transfers with assistance level: Supervision/Verbal cueing   Problem: RH Car Transfers Goal: LTG Patient will perform car transfers with assist (PT) Description: LTG: Patient will perform car transfers with assistance (PT). Flowsheets (Taken 06/10/2021 1212) LTG: Pt will perform car transfers with assist:: Supervision/Verbal cueing   Problem: RH  Ambulation Goal: LTG Patient will ambulate in controlled environment (PT) Description: LTG: Patient will ambulate in a controlled environment, # of feet with assistance (PT). Flowsheets (Taken 06/10/2021 1212) LTG: Pt will ambulate in controlled environ  assist needed:: Supervision/Verbal cueing LTG: Ambulation distance in controlled environment: 150 ft with LRAD Goal: LTG Patient will ambulate in home environment (PT) Description: LTG: Patient will ambulate in home environment, # of feet with assistance (PT). Flowsheets (Taken 06/10/2021 1212) LTG: Pt will ambulate in home environ  assist needed:: Supervision/Verbal cueing LTG: Ambulation distance in home environment: 75 ft with LRAD   Problem: RH Wheelchair Mobility Goal: LTG Patient will propel w/c in controlled environment (PT) Description: LTG: Patient will propel wheelchair in controlled environment, # of feet with assist (PT) Flowsheets (Taken 06/10/2021 1212) LTG: Pt will propel w/c in controlled environ  assist needed:: Independent with assistive device LTG: Propel w/c distance in controlled environment: 150 ft   Problem: RH Stairs Goal: LTG Patient will ambulate up and down stairs w/assist (PT) Description: LTG: Patient will ambulate up and down # of stairs with assistance (PT) Flowsheets (Taken 06/10/2021 1212) LTG: Pt will ambulate up/down stairs assist needed:: Supervision/Verbal cueing LTG: Pt will  ambulate up and down number of stairs: 14 stairs with one handrail

## 2021-06-10 NOTE — Evaluation (Addendum)
Physical Therapy Assessment and Plan  Patient Details  Name: Becky Hobbs MRN: [id number] Date of Birth: 30-Mar-1946  PT Diagnosis: Abnormal posture, Abnormality of gait, Difficulty walking, and Pain in neck, headache Rehab Potential: Good ELOS: 12-14 days   Today's Date: 06/10/2021 PT Individual Time: 0800-0900 PT Individual Time Calculation (min): 60 min    Hospital Problem: Principal Problem:   Fracture of C1 vertebra, closed ([place])   Past Medical History:  Past Medical History:  Diagnosis Date   Anemia    Anxiety    panic attacks   Arthritis    Asthma    Blood transfusion    1973--with twins birth   Bronchitis    Chronic kidney disease    Diabetes mellitus    borderline   GERD (gastroesophageal reflux disease)    H/O hiatal hernia    surgery fixed   Headache    migraines   Heart murmur    MVP   Hepatitis    Hep C   Hypertension    Hypothyroidism    Pneumonia    Past Surgical History:  Past Surgical History:  Procedure Laterality Date   ABDOMINAL HYSTERECTOMY     APPENDECTOMY     APPLICATION OF INTRAOPERATIVE CT SCAN N/A 06/05/2021   Procedure: APPLICATION OF INTRAOPERATIVE CT SCAN;  Surgeon: Dawley, Theodoro Doing, DO;  Location: [facility];  Service: Neurosurgery;  Laterality: N/A;   BACK SURGERY     bladder tack     BREAST CYST EXCISION     BREAST CYST INCISION AND DRAINAGE     BREAST SURGERY     BUNIONECTOMY     CHOLECYSTECTOMY     DILATION AND CURETTAGE OF UTERUS     ESOPHAGOGASTRODUODENOSCOPY (EGD) WITH PROPOFOL N/A 01/03/2019   Procedure: ESOPHAGOGASTRODUODENOSCOPY (EGD) WITH PROPOFOL;  Surgeon: Otis Brace, MD;  Location: [facility];  Service: Gastroenterology;  Laterality: N/A;   FOREIGN BODY REMOVAL  01/03/2019   Procedure: FOREIGN BODY REMOVAL;  Surgeon: Otis Brace, MD;  Location: [facility];  Service: Gastroenterology;;   FRACTURE SURGERY     left wrist   HERNIA REPAIR     hiatal hernia   LUMBAR FUSION  07/26/2017   LUMBAR  LAMINECTOMY  03/04/2012   Procedure: MICRODISCECTOMY LUMBAR LAMINECTOMY;  Surgeon: Jessy Oto, MD;  Location: [facility];  Service: Orthopedics;  Laterality: N/A;  L3-4 central laminectomy   NASAL SEPTUM SURGERY     nissan fundoplication     OVARIAN CYST SURGERY     POSTERIOR CERVICAL FUSION/FORAMINOTOMY N/A 06/05/2021   Procedure: OCCIPITAL- CERVICAL TWO INSTRUMENTATION AND FUSION; EXTENSION TO CERVICAL FIVE;  Surgeon: Karsten Ro, DO;  Location: [facility];  Service: Neurosurgery;  Laterality: N/A;   THORACIC FUSION  07/26/2017   TONSILLECTOMY      Assessment & Plan Clinical Impression:  Becky Hobbs is aa 75 year old RH female with history of CKD, migraines, cirrhosis, T2DM, anxiety d/o , fall 09/2020 with resultant C1 ring fracture treated with c collar but continued to have multiple falls with lateral listhesis, severe occipital HA and right neck pain. She was admitted on 06/05/21 for C1-C5 arthrodesis by Dr. Reatha Armour. Post op, she  continued to have limitation sdue to severe pain in upper back and shoulders affecting ADLs and mobility. CIR recommended due to functional decline. Patient transferred to CIR on 06/09/2021 .   Patient currently requires mod with mobility secondary to muscle weakness, decreased cardiorespiratoy endurance, and decreased sitting balance, decreased standing balance, decreased postural control,  decreased balance strategies, and difficulty maintaining precautions.  Prior to hospitalization, patient was modified independent  with mobility and lived with Family in a House home.  Home access is 4Stairs to enter.  Patient will benefit from skilled PT intervention to maximize safe functional mobility, minimize fall risk, and decrease caregiver burden for planned discharge home with 24 hour assist.  Anticipate patient will benefit from follow up [facility] at discharge.  PT - End of Session Activity Tolerance: Tolerates 30+ min activity with multiple rests Endurance Deficit:  Yes Endurance Deficit Description: frequent rest breaks during functional activity PT Assessment Rehab Potential (ACUTE/IP ONLY): Good PT Barriers to Discharge: Home environment access/layout;Incontinence PT Patient demonstrates impairments in the following area(s): Balance;Endurance;Pain;Safety PT Transfers Functional Problem(s): Bed Mobility;Bed to Chair;Car;Furniture;Floor PT Locomotion Functional Problem(s): Ambulation;Wheelchair Mobility;Stairs PT Plan PT Intensity: Minimum of 1-2 x/day ,45 to 90 minutes PT Frequency: 5 out of 7 days PT Duration Estimated Length of Stay: 12-14 days PT Treatment/Interventions: Ambulation/gait training;Balance/vestibular training;Community reintegration;Discharge planning;Disease management/prevention;DME/adaptive equipment instruction;Functional mobility training;Neuromuscular re-education;Pain management;Patient/family education;Stair training;Therapeutic Activities;Therapeutic Exercise;UE/LE Strength taining/ROM;UE/LE Coordination activities;Wheelchair propulsion/positioning PT Transfers Anticipated Outcome(s): Supervision PT Locomotion Anticipated Outcome(s): Supervision with LRAD PT Recommendation Follow Up Recommendations: None;24 hour supervision/assistance Patient destination: Home Equipment Recommended: To be determined Equipment Details: TBD pending progress, owns [facility] and RW   PT Evaluation Precautions/Restrictions Precautions Precautions: Fall;Cervical Precaution Comments: needs reminder for precautions 8/29; no brace needed Restrictions Weight Bearing Restrictions: No Pain Interference Pain Effect on Sleep: 3. Frequently Pain Interference with Therapy Activities: 3. Frequently Pain Interference with Day-to-Day Activities: 3. Frequently Home Living/Prior Functioning Home Living Available Help at Discharge: Family;Available 24 hours/day Type of Home: House Home Access: Stairs to enter [employer] of Steps: 4 Entrance  Stairs-Rails: Right;Left (cannot reach both) Home Layout: Two level;1/2 bath on main level Alternate Level Stairs-Number of Steps: 14 Alternate Level Stairs-Rails: Left Additional Comments: sister staying with her for now per pt report  Lives With: Family Prior Function Level of Independence: Independent with gait;Independent with transfers;Requires assistive device for independence  Able to Take Stairs?: Yes Driving: No Comments: used SPC and/or RW prior to admission Vision/Perception  Vision - History Ability to See in Adequate Light: 0 Adequate Perception Perception: Within Functional Limits Praxis Praxis: Intact  Cognition Overall Cognitive Status: Within Functional Limits for tasks assessed Arousal/Alertness: Lethargic Orientation Level: Oriented X4 Year: 2022 Month: August Day of Week: Correct Attention: Focused Focused Attention: Appears intact Memory: Impaired Memory Impairment: Decreased long term memory Immediate Memory Recall: Sock;Bed;Blue Memory Recall Sock: Without Cue Memory Recall Blue: Without Cue Memory Recall Bed: Without Cue Awareness: Appears intact Problem Solving: Appears intact Safety/Judgment: Appears intact Sensation Sensation Light Touch: Appears Intact Proprioception: Appears Intact Coordination Gross Motor Movements are Fluid and Coordinated: No Fine Motor Movements are Fluid and Coordinated: No Coordination and Movement Description: impaired 2/2 pain Motor  Motor Motor: Abnormal postural alignment and control Motor - Skilled Clinical Observations: impaired 2/2 pain   Trunk/Postural Assessment  Cervical Assessment Cervical Assessment: Exceptions to [facility] (limited ROM due to pain and cervical precautions) Thoracic Assessment Thoracic Assessment: Exceptions to [facility] (rounded shoulders) Lumbar Assessment Lumbar Assessment: Exceptions to [facility] (posterior pelvic tilt) Postural Control Postural Control: Deficits on evaluation   Balance Balance Balance Assessed: Yes Static Sitting Balance Static Sitting - Balance Support: Bilateral upper extremity supported;Feet supported Static Sitting - Level of Assistance: 4: Min assist Dynamic Sitting Balance Dynamic Sitting - Balance Support: Bilateral upper extremity supported;Feet supported;During functional activity Dynamic Sitting - Level of Assistance:  4: Min Insurance risk surveyor Standing - Balance Support: Bilateral upper extremity supported;During functional activity Static Standing - Level of Assistance: 4: Min assist Extremity Assessment  RUE Assessment Passive Range of Motion (PROM) Comments: 160 sh flexion Active Range of Motion (AROM) Comments: 120 sh flexion General Strength Comments: 3-/5 sh, 4-/5 elbow and grasp LUE Assessment Passive Range of Motion (PROM) Comments: 160 sh flex Active Range of Motion (AROM) Comments: 110 sh flexion General Strength Comments: 3-/5 sh, 4-/5 elbow and grasp RLE Assessment RLE Assessment: Within Functional Limits General Strength Comments: 4/5 hip flex, 5/5 knee and ankle DF LLE Assessment LLE Assessment: Within Functional Limits General Strength Comments: 4/5 hip flexion, 5/5 knee and ankle DF  Care Tool Care Tool Bed Mobility Roll left and right activity   Roll left and right assist level: Maximal Assistance - Patient 25 - 49%    Sit to lying activity   Sit to lying assist level: Moderate Assistance - Patient 50 - 74%    Lying to sitting on side of bed activity   Lying to sitting on side of bed assist level: the ability to move from lying on the back to sitting on the side of the bed with no back support.: Moderate Assistance - Patient 50 - 74%     Care Tool Transfers Sit to stand transfer   Sit to stand assist level: Moderate Assistance - Patient 50 - 74%    Chair/bed transfer Chair/bed transfer activity did not occur: Producer, television/film/video transfer activity did not occur:  Social worker transfer activity did not occur: Safety/medical concerns        Care Tool Locomotion Ambulation Ambulation activity did not occur: Safety/medical concerns        Walk 10 feet activity Walk 10 feet activity did not occur: Safety/medical concerns       Walk 50 feet with 2 turns activity Walk 50 feet with 2 turns activity did not occur: Safety/medical concerns      Walk 150 feet activity Walk 150 feet activity did not occur: Safety/medical concerns      Walk 10 feet on uneven surfaces activity Walk 10 feet on uneven surfaces activity did not occur: Safety/medical concerns      Stairs Stair activity did not occur: Safety/medical concerns        Walk up/down 1 step activity Walk up/down 1 step or curb (drop down) activity did not occur: Safety/medical concerns     Walk up/down 4 steps activity did not occuR: Safety/medical concerns  Walk up/down 4 steps activity      Walk up/down 12 steps activity Walk up/down 12 steps activity did not occur: Safety/medical concerns      Pick up small objects from floor Pick up small object from the floor (from standing position) activity did not occur: Safety/medical concerns      Wheelchair Is the patient using a wheelchair?:  (TBD)          Wheel 50 feet with 2 turns activity      Wheel 150 feet activity        Refer to Care Plan for Long Term Goals  SHORT TERM GOAL WEEK 1 PT Short Term Goal 1 (Week 1): Pt will perform least restrictive transfer with min A consistently PT Short Term Goal 2 (Week 1): Pt will initiate gait training as safe and able PT Short Term Goal 3 (Week 1):  Pt will tolerate sitting OOB x 1 hour PT Short Term Goal 4 (Week 1): Pt will initiate stair training as safe and able  Recommendations for other services: None   Skilled Therapeutic Intervention Evaluation completed (see details above and below) with education on PT POC and goals and individual treatment initiated with  focus on functional transfer assessment, setting patient up with appropriate equipment to be utilized during rehab stay, and orientation to rehab unit and schedule. Pt received seated in bed, reports 9/10 headache and nausea. Nursing and medical team aware, pt able to receive medication during session but functional ability limited during eval due to pain and nausea. Supine to sit with mod A for some LE management and trunk control. Pt initially with R lateral lean in sitting, improves with increased time. Sit to stand with mod A to RW. Pt reports urge to urinate, attempt stand pivot transfer to [facility] but pt unable to complete transfer due to headache. Pt requests to lay back down due to pain. Sit to supine mod A needed for BLE management. Rolling L/R with max A for placement of bedpan. Pt able to continently void while on bedpan. Pt is dependent for pericare and brief change. Pt left seated in bed in care of nursing for medication administration.   Mobility Bed Mobility Bed Mobility: Rolling Right;Rolling Left;Supine to Sit;Sit to Supine Rolling Right: Maximal Assistance - Patient 25-49% Rolling Left: Maximal Assistance - Patient 25-49% Supine to Sit: Moderate Assistance - Patient 50-74% Sit to Supine: Moderate Assistance - Patient 50-74% Transfers Transfers: Sit to Stand;Stand to Sit Sit to Stand: Moderate Assistance - Patient 50-74% Stand to Sit: Moderate Assistance - Patient 50-74% Transfer (Assistive device): Database administrator / Additional Locomotion Stairs: No Wheelchair Mobility Wheelchair Mobility: No   Discharge Criteria: Patient will be discharged from PT if patient refuses treatment 3 consecutive times without medical reason, if treatment goals not met, if there is a change in medical status, if patient makes no progress towards goals or if patient is discharged from hospital.  The above assessment, treatment plan, treatment alternatives and goals were discussed and  mutually agreed upon: by patient   Excell Seltzer, PT, DPT, CSRS 06/10/2021, 12:05 PM

## 2021-06-10 NOTE — Progress Notes (Signed)
Occupational Therapy Session Note  Patient Details  Name: Becky Hobbs MRN: [id number] Date of Birth: [birth year]  Today's Date: 06/10/2021 OT Individual Time: 1300-1340 OT Individual Time Calculation (min): 40 min    Short Term Goals: Week 1:  OT Short Term Goal 1 (Week 1): Pt will be able to manage clothing over hips with S pre toileting and slight A post toileting. OT Short Term Goal 2 (Week 1): Pt will sit to stand from [facility] or toilet with S. OT Short Term Goal 3 (Week 1): Pt will don shirt with set up. OT Short Term Goal 4 (Week 1): Pt will have improved sh ROM to reach across chest to wash under her arms more easily.  Skilled Therapeutic Interventions/Progress Updates:  Balance/vestibular training;Cognitive remediation/compensation;Discharge planning;DME/adaptive equipment instruction;Functional mobility training;Psychosocial support;UE/LE Strength taining/ROM;UE/LE Coordination activities;Therapeutic Exercise;Self Care/advanced ADL retraining;Patient/family education;Neuromuscular re-education   Pt resting in bed upon arrival talking with family. CSW present at beginning. OT intervention with focus on discharge planning and self feeding; encouraging pt to eat and emphasizing importance. Pt initially was going to eat pears only. OTA encouraged pt to eat some chicken and rice. Pt continued conversation with family throughout. Pt declined sitting in chair or w/c to eat lunch. Pt did not eat breakfast. Discussed role of OT and therapy in rehab. Pt remained in bed with bed alarm activated. All needs within reach.   Therapy Documentation Precautions:  Precautions Precautions: Fall, Cervical Precaution Comments: needs reminder for precautions 8/29; no brace needed Restrictions Weight Bearing Restrictions: No Pain: Pain Assessment Pain Score: 8  Pain Type: Acute pain;Surgical pain Pain Location: Neck Pain Descriptors / Indicators: Aching Pain Onset: On-going Pain Intervention(s):  Rest;Repositioned   Therapy/Group: Individual Therapy  Becky Hobbs 06/10/2021, 2:19 PM

## 2021-06-10 NOTE — Progress Notes (Signed)
Requested additional doses of trulicity, educated pt that the pharmacy no longer has any doses. Pt in agreement Sheela Stack, LPN

## 2021-06-11 DIAGNOSIS — N184 Chronic kidney disease, stage 4 (severe): Secondary | ICD-10-CM | POA: Diagnosis not present

## 2021-06-11 DIAGNOSIS — E1122 Type 2 diabetes mellitus with diabetic chronic kidney disease: Secondary | ICD-10-CM | POA: Diagnosis not present

## 2021-06-11 DIAGNOSIS — I129 Hypertensive chronic kidney disease with stage 1 through stage 4 chronic kidney disease, or unspecified chronic kidney disease: Secondary | ICD-10-CM | POA: Diagnosis not present

## 2021-06-11 DIAGNOSIS — E785 Hyperlipidemia, unspecified: Secondary | ICD-10-CM | POA: Diagnosis not present

## 2021-06-11 LAB — GLUCOSE, CAPILLARY
Glucose-Capillary: 139 mg/dL — ABNORMAL HIGH (ref 70–99)
Glucose-Capillary: 123 mg/dL — ABNORMAL HIGH (ref 70–99)
Glucose-Capillary: 145 mg/dL — ABNORMAL HIGH (ref 70–99)
Glucose-Capillary: 114 mg/dL — ABNORMAL HIGH (ref 70–99)

## 2021-06-11 LAB — BASIC METABOLIC PANEL
Anion gap: 15 (ref 5–15)
BUN: 42 mg/dL — ABNORMAL HIGH (ref 8–23)
CO2: 27 mmol/L (ref 22–32)
Calcium: 10.3 mg/dL (ref 8.9–10.3)
Chloride: 95 mmol/L — ABNORMAL LOW (ref 98–111)
Creatinine, Ser: 1.99 mg/dL — ABNORMAL HIGH (ref 0.44–1.00)
GFR, Estimated: 26 mL/min — ABNORMAL LOW (ref >60)
Glucose, Bld: 115 mg/dL — ABNORMAL HIGH (ref 70–99)
Potassium: 4 mmol/L (ref 3.5–5.1)
Sodium: 137 mmol/L (ref 135–145)

## 2021-06-11 NOTE — Progress Notes (Signed)
Inpatient Rehabilitation Care Coordinator Assessment and Plan Patient Details  Name: Becky Hobbs MRN: [id number] Date of Birth: 11/15/1945  Today's Date: 06/11/2021  Hospital Problems: Principal Problem:   Fracture of C1 vertebra, closed ([place])  Past Medical History:  Past Medical History:  Diagnosis Date   Anemia    Anxiety    panic attacks   Arthritis    Asthma    Blood transfusion    1973--with twins birth   Bronchitis    Chronic kidney disease    Diabetes mellitus    borderline   GERD (gastroesophageal reflux disease)    H/O hiatal hernia    surgery fixed   Headache    migraines   Heart murmur    MVP   Hepatitis    Hep C   Hypertension    Hypothyroidism    Pneumonia    Past Surgical History:  Past Surgical History:  Procedure Laterality Date   ABDOMINAL HYSTERECTOMY     APPENDECTOMY     APPLICATION OF INTRAOPERATIVE CT SCAN N/A 06/05/2021   Procedure: APPLICATION OF INTRAOPERATIVE CT SCAN;  Surgeon: Karsten Ro, DO;  Location: [facility];  Service: Neurosurgery;  Laterality: N/A;   BACK SURGERY     bladder tack     BREAST CYST EXCISION     BREAST CYST INCISION AND DRAINAGE     BREAST SURGERY     BUNIONECTOMY     CHOLECYSTECTOMY     DILATION AND CURETTAGE OF UTERUS     ESOPHAGOGASTRODUODENOSCOPY (EGD) WITH PROPOFOL N/A 01/03/2019   Procedure: ESOPHAGOGASTRODUODENOSCOPY (EGD) WITH PROPOFOL;  Surgeon: Otis Brace, MD;  Location: [facility];  Service: Gastroenterology;  Laterality: N/A;   FOREIGN BODY REMOVAL  01/03/2019   Procedure: FOREIGN BODY REMOVAL;  Surgeon: Otis Brace, MD;  Location: [facility];  Service: Gastroenterology;;   FRACTURE SURGERY     left wrist   HERNIA REPAIR     hiatal hernia   LUMBAR FUSION  07/26/2017   LUMBAR LAMINECTOMY  03/04/2012   Procedure: MICRODISCECTOMY LUMBAR LAMINECTOMY;  Surgeon: Jessy Oto, MD;  Location: [facility];  Service: Orthopedics;  Laterality: N/A;  L3-4 central laminectomy   NASAL SEPTUM  SURGERY     nissan fundoplication     OVARIAN CYST SURGERY     POSTERIOR CERVICAL FUSION/FORAMINOTOMY N/A 06/05/2021   Procedure: OCCIPITAL- CERVICAL TWO INSTRUMENTATION AND FUSION; EXTENSION TO CERVICAL FIVE;  Surgeon: Karsten Ro, DO;  Location: [facility];  Service: Neurosurgery;  Laterality: N/A;   THORACIC FUSION  07/26/2017   TONSILLECTOMY     Social History:  reports that she has quit smoking. Her smoking use included cigarettes. She has never used smokeless tobacco. She reports that she does not drink alcohol and does not use drugs.  Family / Support Systems Marital Status: Divorced How Long?: Widowed since 1994 (26 years) Patient Roles: Parent Spouse/Significant Other: Widow Children: 3 adult sons- 2 set of twin boys and a dtr Other Supports: Sisters- Becky Hobbs and Becky Hobbs Anticipated Caregiver: Becky Hobbs Ability/Limitations of Caregiver: None reported Caregiver Availability: 24/7 Family Dynamics: Pt lives alone. Pt gets assistance from her sister Becky Hobbs who currently helps with meal prep and housekeeping needs.  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 Education: vocational school Health Literacy - How often do you need to have someone help you when you read instructions, pamphlets, or other written material from your doctor or  pharmacy?: Never Writes: Yes Employment Status: Retired Date Retired/Disabled/Unemployed: 1992 Age Retired: 50 Public relations account executive Issues: Denies Guardian/Conservator: N/A   Abuse/Neglect Abuse/Neglect Assessment Can Be Completed: Yes Physical Abuse: Denies Verbal Abuse: Denies Sexual Abuse: Denies Exploitation of patient/patient's resources: Denies Self-Neglect: Denies  Patient response to: Social Isolation - How often do you feel lonely or isolated from those around you?: Never  Emotional Status Pt's affect,  behavior and adjustment status: Pt in good spirits at time of visit Recent Psychosocial Issues: Denies Psychiatric History: Denies Substance Abuse History: Denies  Patient / Family Perceptions, Expectations & Goals Pt/Family understanding of illness & functional limitations: Pt and family have a general understanding of care needs Premorbid pt/family roles/activities: Independent and some assistance with meals and housekeeping Anticipated changes in roles/activities/participation: Assistance with ADLs/IADLs  [place] Agencies: None Premorbid Home Care/DME Agencies: None Transportation available at discharge: TBD Is the patient able to respond to transportation needs?: Yes In the past 12 months, has lack of transportation kept you from medical appointments or from getting medications?: No In the past 12 months, has lack of transportation kept you from meetings, work, or from getting things needed for daily living?: No Resource referrals recommended: Neuropsychology  Discharge Planning Living Arrangements: Alone Support Systems: Other relatives, Children Type of Residence: Private residence Insurance Resources: [employer], Multimedia programmer (specify) Nurse, mental health) Financial Resources: Fish farm manager, Other (Comment) (pension and widow's benefits) Financial Screen Referred: No Living Expenses: Own Money Management: Patient Does the patient have any problems obtaining your medications?: No Home Management: Pt sister assists with meal prep and housekeeping Patient/Family Preliminary Plans: No changes. Pt sister is currently assisting with bills .pt reports her bills are autodraft. Care Coordinator Anticipated Follow Up Needs: HH/OP  Clinical Impression SW met with pt in room to complete assessment while she was on the phone with her sister Becky Hobbs. HCPOA- sisters Becky Hobbs and Becky Hobbs have equal rights per pt. Pt is not a English as a second language teacher. DME:hip kit, rollator, 3in1 BSC, shower seat with a  back and shower seat without a back. Pt bedrooms are on second floor with 15 steps and banister.  Becky Hobbs 06/11/2021, 12:42 PM

## 2021-06-11 NOTE — Care Management (Signed)
[place] Individual Statement of Services  Patient Name:  DENNY LAVE  Date:  06/11/2021  Welcome to the [facility].  Our goal is to provide you with an individualized program based on your diagnosis and situation, designed to meet your specific needs.  With this comprehensive rehabilitation program, you will be expected to participate in at least 3 hours of rehabilitation therapies Monday-Friday, with modified therapy programming on the weekends.  Your rehabilitation program will include the following services:  Physical Therapy (PT), Occupational Therapy (OT), 24 hour per day rehabilitation nursing, Therapeutic Recreaction (TR), Psychology, Neuropsychology, Care Coordinator, Rehabilitation Medicine, [place], and Other  Weekly team conferences will be held on Tuesdays to discuss your progress.  Your Inpatient Rehabilitation Care Coordinator will talk with you frequently to get your input and to update you on team discussions.  Team conferences with you and your family in attendance may also be held.  Expected length of stay: 12-14 days   Overall anticipated outcome: Supervision to Independent with an Assistive Device  Depending on your progress and recovery, your program may change. Your Inpatient Rehabilitation Care Coordinator will coordinate services and will keep you informed of any changes. Your Inpatient Rehabilitation Care Coordinator's name and contact numbers are listed  below.  The following services may also be recommended but are not provided by the [facility] will be made to provide these services after discharge if needed.  Arrangements include referral to agencies that provide these services.  Your insurance has been verified to be:  Medicare  A/B  Your primary doctor is:  Marton Redwood  Pertinent information will be shared with your doctor and your insurance company.  Inpatient Rehabilitation Care Coordinator:  Cathleen Corti [phone] or [phone]  Information discussed with and copy given to patient by: Rana Snare, 06/11/2021, 12:33 PM

## 2021-06-11 NOTE — Progress Notes (Signed)
Physical Therapy Session Note  Patient Details  Name: Becky Hobbs MRN: [id number] Date of Birth: 11-Oct-1946  Today's Date: 06/11/2021 PT Individual Time: 4562-5638 and 1303-1400 PT Individual Time Calculation (min): 57 min and 57 min  Short Term Goals: Week 1:  PT Short Term Goal 1 (Week 1): Pt will perform least restrictive transfer with min A consistently PT Short Term Goal 2 (Week 1): Pt will initiate gait training as safe and able PT Short Term Goal 3 (Week 1): Pt will tolerate sitting OOB x 1 hour PT Short Term Goal 4 (Week 1): Pt will initiate stair training as safe and able  Skilled Therapeutic Interventions/Progress Updates: Pt presented in bed immediately stating "I want to go home" and "someone is supposed to be picking me up". Pt reoriented that she is in CIR as not quiet strong/safe enough to go home yet. Pt continues to perseverate on leaving throughout session and increased time required for all activities. Pt noted to close eyes frequently throughout session but does respond appropriately when questioned. Pt performed supine to sit with use of bed features and increased time with minA. Pt was able to manage BLE independently and only required truncal support. PTA threaded pants and pt performed STS with CGA from slightly elevated bed with increased time/effort. Performed ambulatory transfer to w/c and pt transported to day room. Pt ambulated ~45f x 2 with CGA with verbal cues for hand placement during transfers which improved throughout session. Pt noted to ambulate with shortened step length but no LOB noted. Pt transported back to room and performed ambulatory transfer to bed. Performed sit to supine with bed flat and use of bed rail. Pt requesting to remain supine and pt left with bed alarm on, call bell within reach, and NT present to perform bladder scan.   Tx2: Pt presented in bed agreeable to therapy with extensive encouragement. Pt noted to not have eaten lunch, when asked  indicated that she doesn't eat meat as gives her indigestion. Pt then perseverated again on going home with PTA indicating importance of mobility in order to get home safely. Pt ultimately performed supine to sit with use of bed features and minA for truncal support. Pt performed ambulatory transfer to w/c CGA with pt requiring cues for proper hand placement which pt performed intermittently throughout session. Pt requesting comb which PTA obtained and while pt combing scalp PTA questioning if she felt "foggy" to which pt responded "yes, all the time". Once completed pt transported to ortho gym and participated in visual scanning activity on BITS with pt performing reaching using both L and RUE with medium challenges outside BOS. Pt required minA to reach edge of screen with RUE x 1. Pt able to complete task with no LOB but increased time. Performed STS transfers with CGA but required increased time to initiate all activities. Pt transported back to room and performed ambulatory transfer to bed performing sit to supine with minA for BLE management. Pt was able to reposition to comfort with verbal cues. PTA placed pillows under B arms to provide support and possible decrease tension on BUE. Pt left in bed at end of session with bed alarm on, call bell within reach and needs met.   Therapy Documentation Precautions:  Precautions Precautions: Fall, Cervical Precaution Comments: needs reminder for precautions 8/29; no brace needed Restrictions Weight Bearing Restrictions: No General:   Vital Signs: Therapy Vitals Temp: 98.2 F (36.8 C) Temp Source: Oral Pulse Rate: 91 Resp: 18 BP: (Marland Kitchen  116/57 Patient Position (if appropriate): Lying Oxygen Therapy SpO2: 97 % O2 Device: Room Air Pain:   Mobility: Bed Mobility Bed Mobility: Rolling Right;Rolling Left;Supine to Sit;Sit to Supine Rolling Right: Maximal Assistance - Patient 25-49% Rolling Left: Maximal Assistance - Patient 25-49% Supine to Sit:  Moderate Assistance - Patient 50-74% Sit to Supine: Moderate Assistance - Patient 50-74% Transfers Transfers: Sit to Stand;Stand to Sit Sit to Stand: Moderate Assistance - Patient 50-74% Stand to Sit: Moderate Assistance - Patient 50-74% Transfer (Assistive device): Rolling walker Locomotion : Stairs / Additional Locomotion Stairs: No Wheelchair Mobility Wheelchair Mobility: No  Trunk/Postural Assessment : Cervical Assessment Cervical Assessment: Exceptions to [facility] (limited ROM due to pain and cervical precautions) Thoracic Assessment Thoracic Assessment: Exceptions to [facility] (rounded shoulders) Lumbar Assessment Lumbar Assessment: Exceptions to [facility] (posterior pelvic tilt) Postural Control Postural Control: Deficits on evaluation  Balance: Balance Balance Assessed: Yes Static Sitting Balance Static Sitting - Balance Support: Bilateral upper extremity supported;Feet supported Static Sitting - Level of Assistance: 4: Min assist Dynamic Sitting Balance Dynamic Sitting - Balance Support: Bilateral upper extremity supported;Feet supported;During functional activity Dynamic Sitting - Level of Assistance: 4: Min Insurance risk surveyor Standing - Balance Support: Bilateral upper extremity supported;During functional activity Static Standing - Level of Assistance: 4: Min assist Exercises:   Other Treatments:      Therapy/Group: Individual Therapy  Shondale Quinley 06/11/2021, 2:07 PM

## 2021-06-11 NOTE — Progress Notes (Signed)
PROGRESS NOTE   Subjective/Complaints:  Pt reports "pooped all day and night"- doesn't want high dose "of sorbitol again".  0% breakfast eaten so far, but started nibbling.  Pt reports she doesn't feel sleepy, but appears it.  Notes bowels were all in "diaper".  Did require in/out cath at least 1x yesterday.   ROS:  Pt denies SOB, abd pain, CP, N/V/C/D, and vision changes   Objective:   CT HEAD WO CONTRAST (5MM)  Result Date: 06/10/2021 CLINICAL DATA:  Encephalopathy.  Falls. EXAM: CT HEAD WITHOUT CONTRAST TECHNIQUE: Contiguous axial images were obtained from the base of the skull through the vertex without intravenous contrast. COMPARISON:  None. FINDINGS: Brain: There is no mass, hemorrhage or extra-axial collection. There is generalized atrophy without lobar predilection. Hypodensity of the white matter is most commonly associated with chronic microvascular disease. Vascular: No abnormal hyperdensity of the major intracranial arteries or dural venous sinuses. No intracranial atherosclerosis. Skull: Craniocervical fusion. Small right parietal scalp hematoma. No skull fracture. Sinuses/Orbits: No fluid levels or advanced mucosal thickening of the visualized paranasal sinuses. No mastoid or middle ear effusion. The orbits are normal. IMPRESSION: 1. No acute intracranial abnormality. 2. Normal aging brain. 3. Small right parietal scalp hematoma. Electronically Signed   By: Ulyses Jarred M.D.   On: 06/10/2021 01:34   Recent Labs    06/09/21 1353  WBC 10.3  HGB 10.4*  HCT 33.0*  PLT 278   Recent Labs    06/10/21 0514 06/11/21 0451  NA 136 137  K 3.4* 4.0  CL 94* 95*  CO2 28 27  GLUCOSE 205* 115*  BUN 40* 42*  CREATININE 2.24* 1.99*  CALCIUM 9.9 10.3    Intake/Output Summary (Last 24 hours) at 06/11/2021 0858 Last data filed at 06/11/2021 0700 Gross per 24 hour  Intake 600 ml  Output 821 ml  Net -221 ml         Physical Exam: Vital Signs Blood pressure 135/61, pulse 94, temperature 98.6 F (37 C), temperature source Oral, resp. rate 19, height 5\' 4"  (1.626 m), weight 103.4 kg, SpO2 96 %.     General: awake, more alert; sitting up with food in front of her- nibbling- pushed to get her to drink; NAD HENT: conjugate gaze; oropharynx moist CV: regular rate; no JVD Pulmonary: CTA B/L; no W/R/R- good air movement GI: soft, NT, ND, (+)BS; much less distended Psychiatric: appropriate- moving SO slow, responding so slow Neurological: Ox1-2    Assessment/Plan: 1. Functional deficits which require 3+ hours per day of interdisciplinary therapy in a comprehensive inpatient rehab setting. Physiatrist is providing close team supervision and 24 hour management of active medical problems listed below. Physiatrist and rehab team continue to assess barriers to discharge/monitor patient progress toward functional and medical goals  Care Tool:  Bathing    Body parts bathed by patient: Chest, Abdomen, Front perineal area, Left arm, Right arm, Left upper leg, Right upper leg, Face   Body parts bathed by helper: Buttocks, Left lower leg, Right lower leg     Bathing assist Assist Level: Moderate Assistance - Patient 50 - 74%     Upper Body Dressing/Undressing Upper body dressing  What is the patient wearing?: Pull over shirt    Upper body assist Assist Level: Maximal Assistance - Patient 25 - 49%    Lower Body Dressing/Undressing Lower body dressing      What is the patient wearing?: Pants, Incontinence brief     Lower body assist Assist for lower body dressing: Maximal Assistance - Patient 25 - 49%     Toileting Toileting    Toileting assist Assist for toileting: Maximal Assistance - Patient 25 - 49%     Transfers Chair/bed transfer  Transfers assist  Chair/bed transfer activity did not occur: Refused  Chair/bed transfer assist level: Minimal Assistance - Patient > 75%      Locomotion Ambulation   Ambulation assist   Ambulation activity did not occur: Safety/medical concerns  Assist level: Moderate Assistance - Patient 50 - 74% Assistive device: Walker-rolling Max distance: 15'   Walk 10 feet activity   Assist  Walk 10 feet activity did not occur: Safety/medical concerns  Assist level: Moderate Assistance - Patient - 50 - 74% Assistive device: Walker-rolling   Walk 50 feet activity   Assist Walk 50 feet with 2 turns activity did not occur: Safety/medical concerns         Walk 150 feet activity   Assist Walk 150 feet activity did not occur: Safety/medical concerns         Walk 10 feet on uneven surface  activity   Assist Walk 10 feet on uneven surfaces activity did not occur: Safety/medical concerns         Wheelchair     Assist Is the patient using a wheelchair?:  (TBD)             Wheelchair 50 feet with 2 turns activity    Assist            Wheelchair 150 feet activity     Assist          Blood pressure 135/61, pulse 94, temperature 98.6 F (37 C), temperature source Oral, resp. rate 19, height 5\' 4"  (1.626 m), weight 103.4 kg, SpO2 96 %.  Medical Problem List and Plan: 1.  Anterior and posterior C1 ring fracture s/p occiput to C5 fusion             -patient may shower but incision must be covered             -ELOS/Goals: 10-14 days S             -Admit to CIR  -first day of evaluations- will do ASIA exam Thursday  -con't PT and OT- CIR 2.  Antithrombotics: -DVT/anticoagulation:  Mechanical: Sequential compression devices, below knee Bilateral lower extremities 8/31- will see if can add DVT medical prophylaxis             -antiplatelet therapy: N/A 3. Pain Management: continue Oxycodone prn-->decrease to 5 mg prn.              --On Gabapentin 300 mg bid  8/30- pt reports a HA this AM, but reports Oxy helps- doesn't want higher dose- ?makes her hallucinate". Will con't current  dosing 4. Mood: LCSW to follow for evaluation and support.              -antipsychotic agents: N/a 5. Neuropsych: This patient is not fully capable of making decisions on her own behalf. 6. Skin/Wound Care: Monitor wound for healing.  7. Fluids/Electrolytes/Nutrition: Monitor I/O. Check lytes in am.  8. HTN: Monitor BID --on demadex, metoprolol,  9. CKD: Question baseline --SCr-3.06 at admission.   8/30- pt's Cr up from 2.08 to 2.24- will encourage fluids and recheck in AM- wait on d/c'ing IV since Cr is up  8/31- Cr 1.99- and BUN 42- down from 2.24- will wait on IVFs since at baseline today 10.  Hypokalemia: K+ has been progressing down--will recheck today and supplement  8/30- K+ 3.4 this AM- will replete and recheck in AM  8/31- K+ 4.0- much improved 11. H/o panic attacks/anxiety d/o: Decrease Xanax to HS, continue Vistaril tid prn 12.  H/o asthma: Continue Breo.  13. T2DM: Hgb A1C-6.2. Well controlled on Tulicity every Sun-->-will have pt ask son to bring it from home.  --Monitor BS ac/hs. Carb modified diet.   3/33- out of Trulicity- will need son to bring in- BG's doing well- con't regimen 14. 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 Follows with Dr. [facility]. Check ammonia level given confusion 15. Confusion s/p falls: CT head ordered.    8/30- CT was OK, except "aging brain" and small R parietal scalp hematoma- will monitor 16. Neurogenic bowel and bladder  8/31- is requiring cathing sometimes and has bowel incontinence with sorbitol- if continues, will place bowel program- will see if need to start Flomax?  LOS: 2 days A FACE TO FACE EVALUATION WAS PERFORMED  Becky Hobbs 06/11/2021, 8:58 AM

## 2021-06-11 NOTE — Progress Notes (Signed)
Occupational Therapy Session Note  Patient Details  Name: Becky Hobbs MRN: [id number] Date of Birth: 1946/02/26  Today's Date: 06/11/2021 OT Individual Time: 3875-6433 OT Individual Time Calculation (min): 60 min  and Today's Date: 06/11/2021 OT Missed Time: 15 Minutes Missed Time Reason: Patient fatigue   Short Term Goals: Week 1:  OT Short Term Goal 1 (Week 1): Pt will be able to manage clothing over hips with S pre toileting and slight A post toileting. OT Short Term Goal 2 (Week 1): Pt will sit to stand from [facility] or toilet with S. OT Short Term Goal 3 (Week 1): Pt will don shirt with set up. OT Short Term Goal 4 (Week 1): Pt will have improved sh ROM to reach across chest to wash under her arms more easily.  Skilled Therapeutic Interventions/Progress Updates:    Pt greeted at time of session reclined in bed resting but easily woken and agreeable to OT session with encouragement. Pain in neck present, has been ongoing and nursing aware. Therapy to tolerance. Supine > sit Min A and ambulated approx 10 feet to wheelchair. Pt stating she feels like she could go home now, educated on making most of time in rehab for max benefit. Wheelchair transport to gym total A for time and placed moist heat pack on shoulders/traps avoiding surgical site for 10 mins, skin in tact pre and post with pt stating this "felt good." Focused on BUE ROM with the following: SCIFIT onlevel 1 for 4 minutes, unable to tolerate longer and Min physical assist at B scapula. Seated 2 rounds of BITS activity for reaching/crossing midline and Min physical facilitation distally d/t shoulder limitations. Towel slides on high mat table to promote BUE ROM for 1x5 with min facilitation. Note pt needing extended time for all tasks d/t very slow movements. Pt saying unable to continue activity d/t fatigue, transported back to room and stand pivot to bed Min A w/ RW. Alarm on call bell in reach. Missed 15 mins d/t fatigue.   Therapy  Documentation Precautions:  Precautions Precautions: Fall, Cervical Precaution Comments: needs reminder for precautions 8/29; no brace needed Restrictions Weight Bearing Restrictions: No    Therapy/Group: Individual Therapy  Viona Gilmore 06/11/2021, 3:34 PM

## 2021-06-12 LAB — GLUCOSE, CAPILLARY
Glucose-Capillary: 125 mg/dL — ABNORMAL HIGH (ref 70–99)
Glucose-Capillary: 128 mg/dL — ABNORMAL HIGH (ref 70–99)
Glucose-Capillary: 114 mg/dL — ABNORMAL HIGH (ref 70–99)
Glucose-Capillary: 89 mg/dL (ref 70–99)

## 2021-06-12 MED ORDER — LIDOCAINE 5 % EX PTCH
2.0000 | MEDICATED_PATCH | CUTANEOUS | Status: DC
  Administered 2021-06-12 – 2021-06-21 (×9): 2 via TRANSDERMAL
  Filled 2021-06-12 (×15): qty 2

## 2021-06-12 MED ORDER — HEPARIN SODIUM (PORCINE) 5000 UNIT/ML IJ SOLN
5000.0000 [IU] | Freq: Three times a day (TID) | INTRAMUSCULAR | Status: DC
  Administered 2021-06-12 – 2021-06-24 (×36): 5000 [IU] via SUBCUTANEOUS
  Filled 2021-06-12 (×35): qty 1

## 2021-06-12 MED ORDER — TIZANIDINE HCL 2 MG PO TABS
2.0000 mg | ORAL_TABLET | Freq: Three times a day (TID) | ORAL | Status: DC
  Administered 2021-06-12 – 2021-06-24 (×37): 2 mg via ORAL
  Filled 2021-06-12 (×36): qty 1

## 2021-06-12 NOTE — Progress Notes (Signed)
Physical Therapy Session Note  Patient Details  Name: Becky Hobbs MRN: [id number] Date of Birth: Nov 21, 1945  Today's Date: 06/12/2021 PT Individual Time: 1000-1105 and 1305 - 1400 PT Individual Time Calculation (min): 65 min and 55 min  Short Term Goals: Week 1:  PT Short Term Goal 1 (Week 1): Pt will perform least restrictive transfer with min A consistently PT Short Term Goal 2 (Week 1): Pt will initiate gait training as safe and able PT Short Term Goal 3 (Week 1): Pt will tolerate sitting OOB x 1 hour PT Short Term Goal 4 (Week 1): Pt will initiate stair training as safe and able  Skilled Therapeutic Interventions/Progress Updates: Pt presented in bed sleeping, easily aroused but difficult to stay aroused. Pt agreeable to therapy with encouragement. Pt states pain 8/10 B shoulders due to ms soreness/spasm. PTA obtained hot pack and placed on each shoulder x 5 min with skin inspection performed once removed with skin remaining intact. While hot pack on pt performed LE therex with PTA providing mod cues as pt falling asleep in middle of activity. Pt performed ankle pumps, hip abd/add, SLR, heel slides, hip er/ir x 10-12 bilaterally. Pt then performed supine to sit with CGA and use of bed features with increased time, pt indicating that needs to use bathroom. Performed STS with CGA however poor carryover of correct hand placement. Pt ambulated to bathroom CGA with RW and performed LB clothing management with CGA (+urinary void). Pt did require minA for standing from toilet and minA to pull pants over hips. Pt then ambulated to w/c ~85f. Nsg arrived to provide pain meds. With time remaining pt ambulated ~369fwith RW and CGA however pt noted to demonstrate increased sway with pt stating increased neck tension and feels like falling. Pt returned to sitting with symptoms subsiding. Pt transported back to room and performed stand pivot to bed CGA and minA for sit to supine with use of bed rail. Pt able  to perform segmental bridging to reposition in bed. Pt left in bed at end of session with bed alarm on, call bell within reach and needs met.   Tx2: Pt presented in bed with nsg present attempting lunch. Pt states pain 6/10 with nsg administering pain meds. Pt agreeable to eat lunch in recliner and can return to bed once completed. Pt performed supine to sit with minA and use of bed features, and performed stand pivot to recliner. PTA set up recliner with pillows to prop BUE and provide back/neck support. Pt was able to reach and consume ~25% of meal (fruit plate). Pt states has not had much of an appetite since surgery. Pt then attempted to participated in BUE ROM with use of boomwacker. Pt noted to be able to initiate activity but began falling asleep prior to completion. PTA able to arouse pt easily however unable to stay awake. Pt ultimately able to perform stand pivot transfer to bed CGA and performed sit to supine with minA. Pt repositioned to comfort with verbal cues. Pt left in bed at end of session with bed alarm on, call bell within reach and needs met.      Therapy Documentation Precautions:  Precautions Precautions: Fall, Cervical Precaution Comments: needs reminder for precautions 8/29; no brace needed Restrictions Weight Bearing Restrictions: No General:   Vital Signs: Therapy Vitals Temp: 97.8 F (36.6 C) Pulse Rate: 90 Resp: 18 BP: (!) 120/56 Patient Position (if appropriate): Lying Oxygen Therapy SpO2: 98 % O2 Device: Room Air Pain: Pain  Assessment Pain Scale: 0-10 Pain Score: 6  Pain Location: Shoulder Pain Orientation: Right;Left Pain Intervention(s): Medication (See eMAR)   Therapy/Group: Individual Therapy  Ami Thornsberry Oswin Griffith, PTA  06/12/2021, 2:01 PM

## 2021-06-12 NOTE — Progress Notes (Signed)
Occupational Therapy Session Note  Patient Details  Name: Becky Hobbs MRN: [id number] Date of Birth: 02-19-1946  Today's Date: 06/12/2021 OT Individual Time: 4801-6553 OT Individual Time Calculation (min): 42 min  and Today's Date: 06/12/2021 OT Missed Time: 18 Minutes Missed Time Reason: Pain   Short Term Goals: Week 1:  OT Short Term Goal 1 (Week 1): Pt will be able to manage clothing over hips with S pre toileting and slight A post toileting. OT Short Term Goal 2 (Week 1): Pt will sit to stand from [facility] or toilet with S. OT Short Term Goal 3 (Week 1): Pt will don shirt with set up. OT Short Term Goal 4 (Week 1): Pt will have improved sh ROM to reach across chest to wash under her arms more easily.  Skilled Therapeutic Interventions/Progress Updates:    Pt seated EOB with RN present for med pass. Pt attempted to place pills in mouth without assistance but was unable while seated EOB. RN placed meds in pt's mouth. Pt with severe rounded shoulders and forward head while seated EOB. Pt could not recall precautions. Pt repeated throughout session that she was in so much pain that she was unable to "do anything." Pt requested to return to bed after taking pain medications. Sit>supine in bed with supervision but required tot A for repositioning. Myofacial release and BUE/shoulder PROM for pain mgmt with back/head supported. Pt unable to conintue 2/2 pain and increased lethargy after taking medications. Pt remained in bed with all needs within reach and bed alarm activated.   Therapy Documentation Precautions:  Precautions Precautions: Fall, Cervical Precaution Comments: needs reminder for precautions 8/29; no brace needed Restrictions Weight Bearing Restrictions: No General: General OT Amount of Missed Time: 18 Minutes   Pain: Pt c/o 10/10 shoulder and neck pain; RN admin meds during therapy, myofascial release, and repositioned     Therapy/Group: Individual Therapy  Leroy Libman 06/12/2021, 8:47 AM

## 2021-06-12 NOTE — Progress Notes (Signed)
PROGRESS NOTE   Subjective/Complaints:  Pt reports  less foggy but hurts "REALLY BAD:_ pain "horrific".  Mainly neck pain but some shoulder pain- "can't pull her head up".  I.e flex neck.     ROS:  Pt denies SOB, abd pain, CP, N/V/C/D, and vision changes   Objective:   No results found. Recent Labs    06/09/21 1353  WBC 10.3  HGB 10.4*  HCT 33.0*  PLT 278   Recent Labs    06/10/21 0514 06/11/21 0451  NA 136 137  K 3.4* 4.0  CL 94* 95*  CO2 28 27  GLUCOSE 205* 115*  BUN 40* 42*  CREATININE 2.24* 1.99*  CALCIUM 9.9 10.3    Intake/Output Summary (Last 24 hours) at 06/12/2021 1102 Last data filed at 06/12/2021 0700 Gross per 24 hour  Intake 660 ml  Output 600 ml  Net 60 ml        Physical Exam: Vital Signs Blood pressure 130/64, pulse 90, temperature 98 F (36.7 C), temperature source Oral, resp. rate 19, height 5\' 4"  (1.626 m), weight 103.4 kg, SpO2 95 %.      General: awake, alert, appropriate, NAD HENT: conjugate gaze; oropharynx moist CV: regular rate; no JVD Pulmonary: CTA B/L; no W/R/R- good air movement GI: soft, NT, ND, (+)BS- much improved Psychiatric: appropriate; but crying- more interactive than last 2 days.  Neurological: Ox MS: very tight muscles in scalenes and splenius capitus B/L , however very loose in levators and upper traps and supraspinatus B/L- Skin: has honeycomb dressing on posterior neck- peeling off- incision looks OK- no increased heat, no drainage seen- dressing into hairline.     Assessment/Plan: 1. Functional deficits which require 3+ hours per day of interdisciplinary therapy in a comprehensive inpatient rehab setting. Physiatrist is providing close team supervision and 24 hour management of active medical problems listed below. Physiatrist and rehab team continue to assess barriers to discharge/monitor patient progress toward functional and medical goals  Care  Tool:  Bathing    Body parts bathed by patient: Chest, Abdomen, Front perineal area, Left arm, Right arm, Left upper leg, Right upper leg, Face   Body parts bathed by helper: Buttocks, Left lower leg, Right lower leg     Bathing assist Assist Level: Moderate Assistance - Patient 50 - 74%     Upper Body Dressing/Undressing Upper body dressing   What is the patient wearing?: Pull over shirt    Upper body assist Assist Level: Maximal Assistance - Patient 25 - 49%    Lower Body Dressing/Undressing Lower body dressing      What is the patient wearing?: Pants, Incontinence brief     Lower body assist Assist for lower body dressing: Maximal Assistance - Patient 25 - 49%     Toileting Toileting    Toileting assist Assist for toileting: Moderate Assistance - Patient 50 - 74%     Transfers Chair/bed transfer  Transfers assist  Chair/bed transfer activity did not occur: Refused  Chair/bed transfer assist level: Minimal Assistance - Patient > 75%     Locomotion Ambulation   Ambulation assist   Ambulation activity did not occur: Safety/medical concerns  Assist level:  Moderate Assistance - Patient 50 - 74% Assistive device: Walker-rolling Max distance: 15'   Walk 10 feet activity   Assist  Walk 10 feet activity did not occur: Safety/medical concerns  Assist level: Moderate Assistance - Patient - 50 - 74% Assistive device: Walker-rolling   Walk 50 feet activity   Assist Walk 50 feet with 2 turns activity did not occur: Safety/medical concerns         Walk 150 feet activity   Assist Walk 150 feet activity did not occur: Safety/medical concerns         Walk 10 feet on uneven surface  activity   Assist Walk 10 feet on uneven surfaces activity did not occur: Safety/medical concerns         Wheelchair     Assist Is the patient using a wheelchair?:  (TBD)             Wheelchair 50 feet with 2 turns activity    Assist             Wheelchair 150 feet activity     Assist          Blood pressure 130/64, pulse 90, temperature 98 F (36.7 C), temperature source Oral, resp. rate 19, height 5\' 4"  (1.626 m), weight 103.4 kg, SpO2 95 %.  Medical Problem List and Plan: 1.  Anterior and posterior C1 ring fracture s/p occiput to C5 fusion             -patient may shower but incision must be covered             -ELOS/Goals: 10-14 days S             -Admit to CIR  -first day of evaluations- will do ASIA exam Thursday  -Con't PT and OT- cannot do ASIA today due to pt's level of pain.  2.  Antithrombotics: -DVT/anticoagulation:  Mechanical: Sequential compression devices, below knee Bilateral lower extremities 8/31- will see if can add DVT medical prophylaxis 9/1- has been 7 days since surgery- ill start Lovenox- GFR is 22- so will start SQ heparin for now and check with pharmacy.              -antiplatelet therapy: N/A 3. Pain Management: continue Oxycodone prn-->decrease to 5 mg prn.              --On Gabapentin 300 mg bid  8/30- pt reports a HA this AM, but reports Oxy helps- doesn't want higher dose- ?makes her hallucinate". Will con't current dosing  9/1- pt reports pain horrific- appears like muscle spasms of neck- will d/c flexeril and add Zanaflex 2 mg TID- con't opiate dosing.  4. Mood: LCSW to follow for evaluation and support.              -antipsychotic agents: N/a 5. Neuropsych: This patient is not fully capable of making decisions on her own behalf. 6. Skin/Wound Care: Monitor wound for healing.  7. Fluids/Electrolytes/Nutrition: Monitor I/O. Check lytes in am.  8. HTN: Monitor BID --on demadex, metoprolol,  9. CKD: Question baseline --SCr-3.06 at admission.   8/30- pt's Cr up from 2.08 to 2.24- will encourage fluids and recheck in AM- wait on d/c'ing IV since Cr is up  8/31- Cr 1.99- and BUN 42- down from 2.24- will wait on IVFs since at baseline today  9/1- recheck on Monday  10.   Hypokalemia: K+ has been progressing down--will recheck today and supplement  8/30- K+ 3.4 this AM- will  replete and recheck in AM  8/31- K+ 4.0- much improved 11. H/o panic attacks/anxiety d/o: Decrease Xanax to HS, continue Vistaril tid prn 12.  H/o asthma: Continue Breo.  13. T2DM: Hgb A1C-6.2. Well controlled on Tulicity every Sun-->-will have pt ask son to bring it from home.  --Monitor BS ac/hs. Carb modified diet.   1/43- out of Trulicity- will need son to bring in- BG's doing well- con't regimen  9/1- did get trulicity this week- BG's look great- con't regimen 14. 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 Follows with Dr. [facility]. Check ammonia level given confusion 15. Confusion s/p falls: CT head ordered.    8/30- CT was OK, except "aging brain" and small R parietal scalp hematoma- will monitor 16. Neurogenic bowel and bladder  8/31- is requiring cathing sometimes and has bowel incontinence with sorbitol- if continues, will place bowel program- will see if need to start Flomax?  9/1- No incontinence in last 24 hours of urine- and bladder scans <180cc- wait on any meds. Bowel incontinence could have been due to Sorbitol?  LOS: 3 days A FACE TO FACE EVALUATION WAS PERFORMED  Janiylah Hannis 06/12/2021, 11:02 AM

## 2021-06-12 NOTE — IPOC Note (Signed)
Overall Plan of Care [facility]) Patient Details Name: Becky Hobbs MRN: [id number] DOB: Feb 23, 1946  Admitting Diagnosis: Fracture of C1 vertebra, closed [facility])  Hospital Problems: Principal Problem:   Fracture of C1 vertebra, closed ([place])     Functional Problem List: Nursing Bowel, Endurance, Medication Management, Pain, Safety, Skin Integrity  PT Balance, Endurance, Pain, Safety  OT Balance, Cognition, Endurance, Pain, Motor  SLP    TR         Basic ADL's: OT Grooming, Bathing, Dressing, Toileting     Advanced  ADL's: OT       Transfers: PT Bed Mobility, Bed to Chair, Car, Furniture, Floor  OT Toilet, Metallurgist: PT Ambulation, Emergency planning/management officer, Stairs     Additional Impairments: OT Fuctional Use of Upper Extremity  SLP        TR      Anticipated Outcomes Item Anticipated Outcome  Self Feeding independent  Swallowing      Basic self-care  supervision  Toileting  mod I   Bathroom Transfers supervision  Bowel/Bladder  supervision  Transfers  Supervision  Locomotion  Supervision with LRAD  Communication     Cognition     Pain  < 3  Safety/Judgment  supervision   Therapy Plan: PT Intensity: Minimum of 1-2 x/day ,45 to 90 minutes PT Frequency: 5 out of 7 days PT Duration Estimated Length of Stay: 12-14 days OT Intensity: Minimum of 1-2 x/day, 45 to 90 minutes OT Frequency: 5 out of 7 days OT Duration/Estimated Length of Stay: 12-14 days     Due to the current state of emergency, patients may not be receiving their 3-hours of Medicare-mandated therapy.   Team Interventions: Nursing Interventions Patient/Family Education, Bowel Management, Pain Management, Medication Management, Skin Care/Wound Management, Discharge Planning  PT interventions Ambulation/gait training, Balance/vestibular training, Community reintegration, Discharge planning, Disease management/prevention, DME/adaptive equipment instruction, Functional mobility  training, Neuromuscular re-education, Pain management, Patient/family education, Stair training, Therapeutic Activities, Therapeutic Exercise, UE/LE Strength taining/ROM, UE/LE Coordination activities, Wheelchair propulsion/positioning  OT Interventions Training and development officer, Cognitive remediation/compensation, Discharge planning, DME/adaptive equipment instruction, Functional mobility training, Psychosocial support, UE/LE Strength taining/ROM, UE/LE Coordination activities, Therapeutic Exercise, Self Care/advanced ADL retraining, Patient/family education, Neuromuscular re-education  SLP Interventions    TR Interventions    SW/CM Interventions Discharge Planning, Patient/Family Education, Psychosocial Support   Barriers to Discharge MD  Medical stability, Home enviroment access/loayout, Incontinence, Neurogenic bowel and bladder, Wound care, Lack of/limited family support, Weight, Weight bearing restrictions, Medication compliance, and Behavior  Nursing Decreased caregiver support, Home environment access/layout, Wound Care, Lack of/limited family support, Weight, Medication compliance Lives in 2 level home with 3 steps to enter. 14 steps to 2nd level, 1/2 bath on main level. Sister and other famiy members can provide assist at discharge.  PT Home environment access/layout, Incontinence    OT      SLP      SW       Team Discharge Planning: Destination: PT-Home ,OT- Home , SLP-  Projected Follow-up: PT-None, 24 hour supervision/assistance, OT-  Home health OT, SLP-  Projected Equipment Needs: PT-To be determined, OT- To be determined, SLP-  Equipment Details: PT-TBD pending progress, owns SPC and RW, OT-  Patient/family involved in discharge planning: PT- Patient,  OT-Patient, SLP-   MD ELOS: 12-14 days?- However think it's likely more like 21 days? Medical Rehab Prognosis:  Fair Assessment: Pt is a 75 yr old female with C1 ring fx s/p occiput to C5 fusion with incomplete  quadriplegia- with increased risk of DVT; severe neck pain and spasms; and CKD 4- with Cr ~ 3-1.5; and DM on Trulicity; also Cirrhosis from hep C; and neurogenic bowel.   Goals- supervision    See Team Conference Notes for weekly updates to the plan of care

## 2021-06-12 NOTE — Progress Notes (Signed)
Occupational Therapy Session Note  Patient Details  Name: Becky Hobbs MRN: [id number] Date of Birth: 1946/08/17  Today's Date: 06/13/2021 OT Individual Time: 0947-0962 OT Individual Time Calculation (min): 58 min    Short Term Goals: Week 1:  OT Short Term Goal 1 (Week 1): Pt will be able to manage clothing over hips with S pre toileting and slight A post toileting. OT Short Term Goal 2 (Week 1): Pt will sit to stand from [facility] or toilet with S. OT Short Term Goal 3 (Week 1): Pt will don shirt with set up. OT Short Term Goal 4 (Week 1): Pt will have improved sh ROM to reach across chest to wash under her arms more easily.  Skilled Therapeutic Interventions/Progress Updates:    Pt greeted in bed and premedicated for neck pain. Stated pain was "much better" today compared to yesterday, rest breaks provided throughout session to address therapeutically. She declined showering, though OT did provide encouragement to do so. Pt adamant that she wanted to shower at the end of the day and if she could not then she would shower tomorrow. Pt reported she had just toileted and brushed her teeth. Already dressed. Since ADL needs were met, therapeutic focus was placed on OOB tolerance, activity tolerance, and standing endurance. She reported that she liked bowling, had played wii bowling in the past and enjoyed this. Max A to don gripper socks and then pt transitioned to EOB with min cues, HOB elevated, using the bedrail. CGA for short distance ambulatory transfer to the w/c using RW. She was first transported to the laundry room to drop of soiled clothing. Worked on sit<stands and standing endurance when loading clothing into washing machine and adjusting washer settings. Pt opted to participate in bowling while seated in the dayroom, did stand once but then quickly sat back down. Stated that her neck was bothering her in standing and that her eyes were having trouble "focusing." Max cues fading to min cues  with repetition in regards to following instructions on wii bowling. Note that pt is quite slow motorically, needs increased time to execute motor demands of task. CGA for balance assist during that 1 instance when she did agree to stand with RW. Pt was then returned to the room via w/c and completed another short distance ambulatory transfer back to bed using device. Mod A to transition to supine. Left pt comfortably in bed at close of session, all needs within reach and bed alarm set.   Therapy Documentation Precautions:  Precautions Precautions: Fall, Cervical Precaution Comments: needs reminder for precautions 8/29; no brace needed Restrictions Weight Bearing Restrictions: No ADL: ADL Eating: Set up Grooming: Setup Upper Body Bathing: Supervision/safety Where Assessed-Upper Body Bathing: Chair Lower Body Bathing: Moderate assistance Where Assessed-Lower Body Bathing: Chair Upper Body Dressing: Maximal assistance Where Assessed-Upper Body Dressing: Chair Lower Body Dressing: Maximal assistance Where Assessed-Lower Body Dressing: Chair Toileting: Maximal assistance Where Assessed-Toileting: Bedside Commode Toilet Transfer: Minimal assistance Toilet Transfer Method: Stand pivot Toilet Transfer Equipment: Bedside commode   Therapy/Group: Individual Therapy  Jakayden Cancio A Zhara Gieske 06/13/2021, 12:40 PM

## 2021-06-13 ENCOUNTER — Encounter (HOSPITAL_COMMUNITY): Payer: Self-pay | Admitting: Neurological Surgery

## 2021-06-13 LAB — GLUCOSE, CAPILLARY
Glucose-Capillary: 117 mg/dL — ABNORMAL HIGH (ref 70–99)
Glucose-Capillary: 120 mg/dL — ABNORMAL HIGH (ref 70–99)
Glucose-Capillary: 123 mg/dL — ABNORMAL HIGH (ref 70–99)
Glucose-Capillary: 111 mg/dL — ABNORMAL HIGH (ref 70–99)

## 2021-06-13 MED ORDER — METHYLPHENIDATE HCL 5 MG PO TABS: 5.0000 mg | ORAL_TABLET | Freq: Once | ORAL | Status: DC

## 2021-06-13 MED ORDER — METHYLPHENIDATE HCL 5 MG PO TABS
5.0000 mg | ORAL_TABLET | Freq: Two times a day (BID) | ORAL | Status: DC
  Administered 2021-06-14 – 2021-06-24 (×22): 5 mg via ORAL
  Filled 2021-06-13 (×23): qty 1

## 2021-06-13 MED ORDER — METHYLPHENIDATE HCL 5 MG PO TABS
5.0000 mg | ORAL_TABLET | Freq: Once | ORAL | Status: AC
  Administered 2021-06-13: 5 mg via ORAL
  Filled 2021-06-13: qty 1

## 2021-06-13 NOTE — Progress Notes (Signed)
Occupational Therapy Session Note  Patient Details  Name: Becky Hobbs MRN: [id number] Date of Birth: [birth year]  Today's Date: 06/13/2021 OT Individual Time: 1300-1335 OT Individual Time Calculation (min): 35 min    Short Term Goals: Week 1:  OT Short Term Goal 1 (Week 1): Pt will be able to manage clothing over hips with S pre toileting and slight A post toileting. OT Short Term Goal 2 (Week 1): Pt will sit to stand from [facility] or toilet with S. OT Short Term Goal 3 (Week 1): Pt will don shirt with set up. OT Short Term Goal 4 (Week 1): Pt will have improved sh ROM to reach across chest to wash under her arms more easily.  Skilled Therapeutic Interventions/Progress Updates:    Pt resting in bed upon arrival, lunch tray on bedside table over bed and pt's cell phone in lap. Pt stated she was trying to figure out how to locate someone's pay check ([employer] employee.) RN informed OTA that pt overheard conversation between two employees outside of pt's door. Pt accepted reply that OTA and RT would take care of situation. Pt oriented to place, month, but thought day of week was Saturday. Focus on eating lunch. Pt cannot eat beef and RN [place] notified of same. Pt would not eat broccoli because it tasted like beef. Pt agreeable to eating orange cream magic cup. Pt much more alert this session then earlier session. Discussed task pt must be able to complete before she can return home. Pt agreed she needed to complete basic ADLs without assistance. Pt remained in bed eating magic cup. Bed alarm activate.  Therapy Documentation Precautions:  Precautions Precautions: Fall, Cervical Precaution Comments: needs reminder for precautions 8/29; no brace needed Restrictions Weight Bearing Restrictions: No Pain:  Pt c/o 5/10 neck pain relieved with repositioning in bed   Therapy/Group: Individual Therapy  Leroy Libman 06/13/2021, 2:33 PM

## 2021-06-13 NOTE — Progress Notes (Signed)
Physical Therapy Session Note  Patient Details  Name: Becky Hobbs MRN: [id number] Date of Birth: [birth year]  Today's Date: 06/13/2021 PT Individual Time: 1405-1505 PT Individual Time Calculation (min): 60 min   Short Term Goals: Week 1:  PT Short Term Goal 1 (Week 1): Pt will perform least restrictive transfer with min A consistently PT Short Term Goal 2 (Week 1): Pt will initiate gait training as safe and able PT Short Term Goal 3 (Week 1): Pt will tolerate sitting OOB x 1 hour PT Short Term Goal 4 (Week 1): Pt will initiate stair training as safe and able  Skilled Therapeutic Interventions/Progress Updates: Pt presented in bed awake and alert. Pt agreeable to therapy and requesting to go outside. Pt states some pain in B shoulders but unable to rate and "is trying not to think about it". Performed supine to sit EOB with CGA and use of bed features. PTA donned socks and shoes for time management. Performed ambulatory transfer to w/c with RW and CGA. Pt transported to patio in front of [facility] entrance and was able to engage pt in sustained conversation. Pt then stood to begin ambulation around fountain with pt stating "I need to pee" upon standing. Ambulation deferred with pt transported back to room. Performed ambulatory transfer to bathroom with RW and CGA. Pt able to pull shorts and brief down with CGA with pt noting incontinent BM. Pt sat at toilet with decreased eccentric control and was able to have continent urinary void. Pt voiced frustration at incontinent BM as she states was able to have continent BMs prior to surgery. Pt performed peri-care in sitting with supervision and PTA used washcloth with pt in standing to ensure cleanliness. Pt required minA once completed to pull brief and pants over hips and was able to perform ambulatory transfer back to bed with CGA. Performed sit to supine with bed flat and use of bedrail with supervision. Pt repositioned to comfort at end of session and left  with bed alarm on, call bell within reach and needs met.      Therapy Documentation Precautions:  Precautions Precautions: Fall, Cervical Precaution Comments: needs reminder for precautions 8/29; no brace needed Restrictions Weight Bearing Restrictions: No General:   Vital Signs: Therapy Vitals Temp: (!) 97.5 F (36.4 C) Temp Source: Oral Pulse Rate: 83 Resp: 16 BP: (!) 111/46 Patient Position (if appropriate): Lying Oxygen Therapy SpO2: 96 % O2 Device: Room Air Pain:   Mobility:   Locomotion :    Trunk/Postural Assessment :    Balance:   Exercises:   Other Treatments:      Therapy/Group: Individual Therapy  [name] 06/13/2021, 4:31 PM

## 2021-06-13 NOTE — Progress Notes (Signed)
PROGRESS NOTE   Subjective/Complaints:  Pt reports neck is sore- but admits it is better than yesterday.  "Just sore".  Having BM- last BM was last night.   Michela Pitcher feels less foggy this AM. Is upset had Sorbitol and says "still pooping'- explained that means she was full of stool.   ROS:  Pt denies SOB, abd pain, CP, N/V/C/D, and vision changes   Objective:   No results found. No results for input(s): WBC, HGB, HCT, PLT in the last 72 hours.  Recent Labs    06/11/21 0451  NA 137  K 4.0  CL 95*  CO2 27  GLUCOSE 115*  BUN 42*  CREATININE 1.99*  CALCIUM 10.3    Intake/Output Summary (Last 24 hours) at 06/13/2021 1128 Last data filed at 06/13/2021 0906 Gross per 24 hour  Intake 410 ml  Output --  Net 410 ml        Physical Exam: Vital Signs Blood pressure (!) 129/47, pulse 86, temperature 98.2 F (36.8 C), temperature source Oral, resp. rate 19, height 5\' 4"  (1.626 m), weight 103.4 kg, SpO2 92 %.       General: awake, alert, appropriate, laying supine in bed; NAD HENT: conjugate gaze; oropharynx moist- stiff neck- not moving Head/doing any ROM, but upper traps/scalenes/splenius less tight.  CV: regular rate; no JVD Pulmonary: CTA B/L; no W/R/R- good air movement GI: soft, NT, ND, (+)BS; slightly hyperactive Psychiatric: appropriate Neurological: Ox2- better  MS: very tight muscles in scalenes and splenius capitus B/L , however very loose in levators and upper traps and supraspinatus B/L- Skin:  incision looks OK- no increased heat, no drainage seen- dressing into hairline.     Assessment/Plan: 1. Functional deficits which require 3+ hours per day of interdisciplinary therapy in a comprehensive inpatient rehab setting. Physiatrist is providing close team supervision and 24 hour management of active medical problems listed below. Physiatrist and rehab team continue to assess barriers to  discharge/monitor patient progress toward functional and medical goals  Care Tool:  Bathing    Body parts bathed by patient: Chest, Abdomen, Front perineal area, Left arm, Right arm, Left upper leg, Right upper leg, Face   Body parts bathed by helper: Buttocks, Left lower leg, Right lower leg     Bathing assist Assist Level: Moderate Assistance - Patient 50 - 74%     Upper Body Dressing/Undressing Upper body dressing   What is the patient wearing?: Pull over shirt    Upper body assist Assist Level: Maximal Assistance - Patient 25 - 49%    Lower Body Dressing/Undressing Lower body dressing      What is the patient wearing?: Pants, Incontinence brief     Lower body assist Assist for lower body dressing: Maximal Assistance - Patient 25 - 49%     Toileting Toileting    Toileting assist Assist for toileting: Moderate Assistance - Patient 50 - 74%     Transfers Chair/bed transfer  Transfers assist  Chair/bed transfer activity did not occur: Refused  Chair/bed transfer assist level: Minimal Assistance - Patient > 75%     Locomotion Ambulation   Ambulation assist   Ambulation activity did not occur: Safety/medical  concerns  Assist level: Moderate Assistance - Patient 50 - 74% Assistive device: Walker-rolling Max distance: 15'   Walk 10 feet activity   Assist  Walk 10 feet activity did not occur: Safety/medical concerns  Assist level: Moderate Assistance - Patient - 50 - 74% Assistive device: Walker-rolling   Walk 50 feet activity   Assist Walk 50 feet with 2 turns activity did not occur: Safety/medical concerns         Walk 150 feet activity   Assist Walk 150 feet activity did not occur: Safety/medical concerns         Walk 10 feet on uneven surface  activity   Assist Walk 10 feet on uneven surfaces activity did not occur: Safety/medical concerns         Wheelchair     Assist Is the patient using a wheelchair?: No (TBD)              Wheelchair 50 feet with 2 turns activity    Assist            Wheelchair 150 feet activity     Assist          Blood pressure (!) 129/47, pulse 86, temperature 98.2 F (36.8 C), temperature source Oral, resp. rate 19, height 5\' 4"  (1.626 m), weight 103.4 kg, SpO2 92 %.  Medical Problem List and Plan: 1.  Anterior and posterior C1 ring fracture s/p occiput to C5 fusion             -patient may shower but incision must be covered             -ELOS/Goals: 10-14 days S             -Admit to CIR  -first day of evaluations- will do ASIA exam Thursday  -con't PT and OT- do ASIA early next week.   2.  Antithrombotics: -DVT/anticoagulation:  Mechanical: Sequential compression devices, below knee Bilateral lower extremities 9/1- has been 7 days since surgery- ill start Lovenox- GFR is 22- so will start SQ heparin for now and check with pharmacy.              -antiplatelet therapy: N/A 3. Pain Management: continue Oxycodone prn-->decrease to 5 mg prn.              --On Gabapentin 300 mg bid  8/30- pt reports a HA this AM, but reports Oxy helps- doesn't want higher dose- ?makes her hallucinate". Will con't current dosing  9/1- pt reports pain horrific- appears like muscle spasms of neck- will d/c flexeril and add Zanaflex 2 mg TID- con't opiate dosing. 9/2- pain is better- still sore- but explained she had neck surgery- expects to have some pain.   4. Mood: LCSW to follow for evaluation and support.              -antipsychotic agents: N/a 5. Neuropsych: This patient is not fully capable of making decisions on her own behalf. 6. Skin/Wound Care: Monitor wound for healing.  7. Fluids/Electrolytes/Nutrition: Monitor I/O. Check lytes in am.  8. HTN: Monitor BID --on demadex, metoprolol,   9/2- BP controlle-d con't regimen 9. CKD: Question baseline --SCr-3.06 at admission.   8/30- pt's Cr up from 2.08 to 2.24- will encourage fluids and recheck in AM- wait on d/c'ing IV  since Cr is up  8/31- Cr 1.99- and BUN 42- down from 2.24- will wait on IVFs since at baseline today  9/1- recheck on Monday  10.  Hypokalemia:  K+ has been progressing down--will recheck today and supplement  8/30- K+ 3.4 this AM- will replete and recheck in AM  8/31- K+ 4.0- much improved 11. H/o panic attacks/anxiety d/o: Decrease Xanax to HS, continue Vistaril tid prn 12.  H/o asthma: Continue Breo.  13. T2DM: Hgb A1C-6.2. Well controlled on Tulicity every Sun-->-will have pt ask son to bring it from home.  --Monitor BS ac/hs. Carb modified diet.   5/75- out of Trulicity- will need son to bring in- BG's doing well- con't regimen  9/1- did get trulicity this week- BG's look great- con't regimen 14. 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 Follows with Dr. [facility]. Check ammonia level given confusion 15. Confusion s/p falls: CT head ordered.    8/30- CT was OK, except "aging brain" and small R parietal scalp hematoma- will monitor 16. Neurogenic bowel and bladder  8/31- is requiring cathing sometimes and has bowel incontinence with sorbitol- if continues, will place bowel program- will see if need to start Flomax?  9/1- No incontinence in last 24 hours of urine- and bladder scans <180cc- wait on any meds. Bowel incontinence could have been due to Sorbitol?  9/2- pt reports still having BM's from the sorbitol- explained she was full of stool- and should improve.   LOS: 4 days A FACE TO FACE EVALUATION WAS PERFORMED  Eliza Grissinger 06/13/2021, 11:28 AM

## 2021-06-13 NOTE — Plan of Care (Signed)
  Problem: Consults Goal: RH GENERAL PATIENT EDUCATION Description: See Patient Education module for education specifics. Outcome: Progressing Goal: Skin Care Protocol Initiated - if Braden Score 18 or less Description: If consults are not indicated, leave blank or document N/A Outcome: Progressing   Problem: RH BOWEL ELIMINATION Goal: RH STG MANAGE BOWEL WITH ASSISTANCE Description: STG Manage Bowel with supervision Assistance. Outcome: Progressing Goal: RH STG MANAGE BOWEL W/MEDICATION W/ASSISTANCE Description: STG Manage Bowel with Medication with supervision Assistance. Outcome: Progressing   Problem: RH SKIN INTEGRITY Goal: RH STG ABLE TO PERFORM INCISION/WOUND CARE W/ASSISTANCE Description: STG Able To Perform Incision/Wound Care With supervision Assistance. Outcome: Progressing   Problem: RH SAFETY Goal: RH STG DECREASED RISK OF FALL WITH ASSISTANCE Description: STG Decreased Risk of Fall With cues and reminders. Outcome: Progressing   Problem: RH PAIN MANAGEMENT Goal: RH STG PAIN MANAGED AT OR BELOW PT'S PAIN GOAL Description: < 3 on a 0-10 pain scale. Outcome: Progressing   Problem: RH KNOWLEDGE DEFICIT GENERAL Goal: RH STG INCREASE KNOWLEDGE OF SELF CARE AFTER HOSPITALIZATION Description: Patient will demonstrate knowledge of medication management, pain management, safety precautions with educational materials and handouts provided by staff independently at discharge. Outcome: Progressing

## 2021-06-13 NOTE — Progress Notes (Signed)
Occupational Therapy Note  Patient Details  Name: Becky Hobbs MRN: [id number] Date of Birth: 15-Nov-1945  Today's Date: 06/13/2021 OT Missed Time: 9 Minutes Missed Time Reason: Patient fatigue  Pt resting in bed upon arrival with eyes closed. Easily aroused although pt had difficulty keeping eyes open. Pt stated she was "worn out" from earlier therapy and her shoulders/neck were starting to hurt again, although improved from previous day. Pt requested OTA return later in the day. OT session scheduled for after lunch. Pt missed 45 mins skilled OT services 2/2 fatigue.   Leotis Shames [facility] 06/13/2021, 11:12 AM

## 2021-06-14 LAB — GLUCOSE, CAPILLARY
Glucose-Capillary: 118 mg/dL — ABNORMAL HIGH (ref 70–99)
Glucose-Capillary: 122 mg/dL — ABNORMAL HIGH (ref 70–99)
Glucose-Capillary: 112 mg/dL — ABNORMAL HIGH (ref 70–99)
Glucose-Capillary: 102 mg/dL — ABNORMAL HIGH (ref 70–99)

## 2021-06-14 NOTE — Progress Notes (Signed)
Occupational Therapy Session Note  Patient Details  Name: Becky Hobbs MRN: [id number] Date of Birth: Sep 17, 1946  Today's Date: 06/15/2021 OT Individual Time: 0900-1027 OT Individual Time Calculation (min): 87 min   Short Term Goals: Week 1:  OT Short Term Goal 1 (Week 1): Pt will be able to manage clothing over hips with S pre toileting and slight A post toileting. OT Short Term Goal 2 (Week 1): Pt will sit to stand from [facility] or toilet with S. OT Short Term Goal 3 (Week 1): Pt will don shirt with set up. OT Short Term Goal 4 (Week 1): Pt will have improved sh ROM to reach across chest to wash under her arms more easily.  Skilled Therapeutic Interventions/Progress Updates:    Pt greeted in the w/c via PT handoff. Agreeable to earlier than scheduled session. It took some time to encourage pt to shower as she really wanted family to assist her. Explained role of OT and safety implications given her cervical precautions + incision. Pt in time agreeable. CGA for ambulatory transfer to the TTB using RW with vcs. CGA for sit<stands as needed to doff clothing and then pt bathed at sit<stand level. LH sponge utilized for washing feet. Mod A for thorough perihygiene in the back due to body habitus. Note that her cervical incision was covered prior to shower and OT held hand held shower hose to ensure spray was at shoulder level and below. Pt was very meticulous during bathing tasks to ensure cleanliness, required increased time. She then ambulated to the w/c parked at bedside and completed dressing tasks at sit<stand level using RW. Mod A for UB dressing. Max A for LB due to a great deal of fatigue at this point in session. CGA for sit<stands and for dynamic standing balance. Setup for grooming tasks. Pt remained sitting up in the w/c at close of session, affect bright, verbalizing appreciation for shower today. All needs within reach and safety belt fastened.   Therapy Documentation Precautions:   Precautions Precautions: Fall, Cervical Precaution Comments: needs reminder for precautions 8/29; no brace needed Restrictions Weight Bearing Restrictions: No Vital Signs: Oxygen Therapy SpO2: 97 % O2 Device: Room Air Pain: pt wore c-collar for comfort/pain mgt today when out of the shower. Pt reported pain to be manageable with rest breaks as needed Pain Assessment Pain Scale: 0-10 Pain Score: 0-No pain ADL: ADL Eating: Set up Grooming: Setup Upper Body Bathing: Supervision/safety Where Assessed-Upper Body Bathing: Chair Lower Body Bathing: Moderate assistance Where Assessed-Lower Body Bathing: Chair Upper Body Dressing: Maximal assistance Where Assessed-Upper Body Dressing: Chair Lower Body Dressing: Maximal assistance Where Assessed-Lower Body Dressing: Chair Toileting: Maximal assistance Where Assessed-Toileting: Bedside Commode Toilet Transfer: Minimal assistance Toilet Transfer Method: Stand pivot Toilet Transfer Equipment: Bedside commode     Therapy/Group: Individual Therapy  Bertina Guthridge A Drewey Begue 06/15/2021, 12:48 PM

## 2021-06-14 NOTE — Progress Notes (Signed)
PROGRESS NOTE   Subjective/Complaints: No complaints this morning Ritalin appears to be helping   ROS:  Pt denies SOB, abd pain, CP, N/V/C/D, and vision changes   Objective:   No results found. No results for input(s): WBC, HGB, HCT, PLT in the last 72 hours.  No results for input(s): NA, K, CL, CO2, GLUCOSE, BUN, CREATININE, CALCIUM in the last 72 hours.   Intake/Output Summary (Last 24 hours) at 06/14/2021 1511 Last data filed at 06/14/2021 0700 Gross per 24 hour  Intake 118 ml  Output --  Net 118 ml        Physical Exam: Vital Signs Blood pressure (!) 99/50, pulse 78, temperature 97.7 F (36.5 C), temperature source Oral, resp. rate 17, height 5\' 4"  (1.626 m), weight 103.4 kg, SpO2 97 %. Gen: no distress, normal appearing HEENT: oral mucosa pink and moist, NCAT Cardio: Reg rate Chest: normal effort, normal rate of breathing Abd: soft, non-distended Ext: no edema Psych: pleasant, normal affect Skin: intact Neuro:  MS: very tight muscles in scalenes and splenius capitus B/L , however very loose in levators and upper traps and supraspinatus B/L- Skin:  incision looks OK- no increased heat, no drainage seen- dressing into hairline.     Assessment/Plan: 1. Functional deficits which require 3+ hours per day of interdisciplinary therapy in a comprehensive inpatient rehab setting. Physiatrist is providing close team supervision and 24 hour management of active medical problems listed below. Physiatrist and rehab team continue to assess barriers to discharge/monitor patient progress toward functional and medical goals  Care Tool:  Bathing    Body parts bathed by patient: Chest, Abdomen, Front perineal area, Left arm, Right arm, Left upper leg, Right upper leg, Face   Body parts bathed by helper: Buttocks, Left lower leg, Right lower leg     Bathing assist Assist Level: Moderate Assistance - Patient 50 - 74%      Upper Body Dressing/Undressing Upper body dressing   What is the patient wearing?: Pull over shirt    Upper body assist Assist Level: Maximal Assistance - Patient 25 - 49%    Lower Body Dressing/Undressing Lower body dressing      What is the patient wearing?: Pants, Incontinence brief     Lower body assist Assist for lower body dressing: Maximal Assistance - Patient 25 - 49%     Toileting Toileting    Toileting assist Assist for toileting: Moderate Assistance - Patient 50 - 74%     Transfers Chair/bed transfer  Transfers assist  Chair/bed transfer activity did not occur: Refused  Chair/bed transfer assist level: Contact Guard/Touching assist     Locomotion Ambulation   Ambulation assist   Ambulation activity did not occur: Safety/medical concerns  Assist level: Contact Guard/Touching assist Assistive device: Walker-rolling Max distance: 76ft   Walk 10 feet activity   Assist  Walk 10 feet activity did not occur: Safety/medical concerns  Assist level: Contact Guard/Touching assist Assistive device: Walker-rolling   Walk 50 feet activity   Assist Walk 50 feet with 2 turns activity did not occur: Safety/medical concerns         Walk 150 feet activity   Assist Walk 150 feet  activity did not occur: Safety/medical concerns         Walk 10 feet on uneven surface  activity   Assist Walk 10 feet on uneven surfaces activity did not occur: Safety/medical concerns         Wheelchair     Assist Is the patient using a wheelchair?: No (TBD)             Wheelchair 50 feet with 2 turns activity    Assist            Wheelchair 150 feet activity     Assist          Blood pressure (!) 99/50, pulse 78, temperature 97.7 F (36.5 C), temperature source Oral, resp. rate 17, height 5\' 4"  (1.626 m), weight 103.4 kg, SpO2 97 %.  Medical Problem List and Plan: 1.  Anterior and posterior C1 ring fracture s/p occiput to C5  fusion             -patient may shower but incision must be covered             -ELOS/Goals: 10-14 days S             -Admit to CIR  -first day of evaluations- will do ASIA exam Thursday  -Continue PT and OT- do ASIA early next week.   2.  Antithrombotics: -DVT/anticoagulation:  Mechanical: Sequential compression devices, below knee Bilateral lower extremities 9/1- has been 7 days since surgery- ill start Lovenox- GFR is 22- so will start SQ heparin for now and check with pharmacy.              -antiplatelet therapy: N/A 3. Pain: continue Oxycodone prn-->decrease to 5 mg prn.              --On Gabapentin 300 mg bid  8/30- pt reports a HA this AM, but reports Oxy helps- doesn't want higher dose- ?makes her hallucinate". Will con't current dosing  9/1- pt reports pain horrific- appears like muscle spasms of neck- will d/c flexeril and add Zanaflex 2 mg TID- con't opiate dosing. 9/2- pain is better- still sore- but explained she had neck surgery- expects to have some pain.   4. Mood: LCSW to follow for evaluation and support.              -antipsychotic agents: N/a 5. Neuropsych: This patient is not fully capable of making decisions on her own behalf. 6. Skin/Wound Care: Monitor wound for healing.  7. Fluids/Electrolytes/Nutrition: Monitor I/O. Check lytes in am.  8. HTN: Monitor BID --on demadex, metoprolol,   9/2- BP controlle-d con't regimen 9. CKD: Question baseline --SCr-3.06 at admission.   8/30- pt's Cr up from 2.08 to 2.24- will encourage fluids and recheck in AM- wait on d/c'ing IV since Cr is up  8/31- Cr 1.99- and BUN 42- down from 2.24- will wait on IVFs since at baseline today  9/1- recheck on Monday  10.  Hypokalemia: K+ has been progressing down--will recheck today and supplement  8/30- K+ 3.4 this AM- will replete and recheck in AM  8/31- K+ 4.0- much improved 11. H/o panic attacks/anxiety d/o: Decrease Xanax to HS, continue Vistaril tid prn 12.  H/o asthma: Continue Breo.   13. T2DM: Hgb A1C-6.2. Well controlled on Tulicity every Sun-->-will have pt ask son to bring it from home.  --Monitor BS ac/hs. Carb modified diet.  9/37- out of Trulicity- will need son to bring in- BG's doing well- con't regimen 9/1-  did get trulicity this week- BG's look great- con't regimen 14. 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 Follows with Dr[facility]. Check ammonia level given confusion 15. Confusion s/p falls: CT head ordered.    8/30- CT was OK, except "aging brain" and small R parietal scalp hematoma- will monitor 16. Neurogenic bowel and bladder  8/31- is requiring cathing sometimes and has bowel incontinence with sorbitol- if continues, will place bowel program- will see if need to start Flomax?  9/1- No incontinence in last 24 hours of urine- and bladder scans <180cc- wait on any meds. Bowel incontinence could have been due to Sorbitol?  9/2- pt reports still having BM's from the sorbitol- explained she was full of stool- and should improve.    LOS: 5 days A FACE TO FACE EVALUATION WAS PERFORMED  Becky Hobbs 06/14/2021, 3:11 PM

## 2021-06-14 NOTE — Plan of Care (Signed)
  Problem: Consults Goal: RH GENERAL PATIENT EDUCATION Description: See Patient Education module for education specifics. Outcome: Progressing Goal: Skin Care Protocol Initiated - if Braden Score 18 or less Description: If consults are not indicated, leave blank or document N/A Outcome: Progressing   Problem: RH BOWEL ELIMINATION Goal: RH STG MANAGE BOWEL WITH ASSISTANCE Description: STG Manage Bowel with supervision Assistance. Outcome: Progressing Goal: RH STG MANAGE BOWEL W/MEDICATION W/ASSISTANCE Description: STG Manage Bowel with Medication with supervision Assistance. Outcome: Progressing   Problem: RH SKIN INTEGRITY Goal: RH STG ABLE TO PERFORM INCISION/WOUND CARE W/ASSISTANCE Description: STG Able To Perform Incision/Wound Care With supervision Assistance. Outcome: Progressing   Problem: RH SAFETY Goal: RH STG DECREASED RISK OF FALL WITH ASSISTANCE Description: STG Decreased Risk of Fall With cues and reminders. Outcome: Progressing   Problem: RH PAIN MANAGEMENT Goal: RH STG PAIN MANAGED AT OR BELOW PT'S PAIN GOAL Description: < 3 on a 0-10 pain scale. Outcome: Progressing   Problem: RH KNOWLEDGE DEFICIT GENERAL Goal: RH STG INCREASE KNOWLEDGE OF SELF CARE AFTER HOSPITALIZATION Description: Patient will demonstrate knowledge of medication management, pain management, safety precautions with educational materials and handouts provided by staff independently at discharge. Outcome: Progressing

## 2021-06-15 LAB — GLUCOSE, CAPILLARY
Glucose-Capillary: 116 mg/dL — ABNORMAL HIGH (ref 70–99)
Glucose-Capillary: 131 mg/dL — ABNORMAL HIGH (ref 70–99)
Glucose-Capillary: 115 mg/dL — ABNORMAL HIGH (ref 70–99)
Glucose-Capillary: 124 mg/dL — ABNORMAL HIGH (ref 70–99)

## 2021-06-15 MED ORDER — HOME MED STORE IN PYXIS: 1.0000 | Freq: Every day | Status: DC

## 2021-06-15 MED ORDER — METOPROLOL SUCCINATE ER 50 MG PO TB24
175.0000 mg | ORAL_TABLET | Freq: Every day | ORAL | Status: DC
  Administered 2021-06-16 – 2021-06-24 (×9): 175 mg via ORAL
  Filled 2021-06-15 (×10): qty 1

## 2021-06-15 NOTE — Progress Notes (Signed)
PROGRESS NOTE   Subjective/Complaints: C/o diarrhea with sorbitol No other complaints Her sister is visiting her today  ROS:  Pt denies SOB, abd pain, CP, and vision changes, +diarrhea   Objective:   No results found. No results for input(s): WBC, HGB, HCT, PLT in the last 72 hours.  No results for input(s): NA, K, CL, CO2, GLUCOSE, BUN, CREATININE, CALCIUM in the last 72 hours.   Intake/Output Summary (Last 24 hours) at 06/15/2021 1212 Last data filed at 06/15/2021 0800 Gross per 24 hour  Intake 555 ml  Output --  Net 555 ml        Physical Exam: Vital Signs Blood pressure (!) 114/42, pulse 83, temperature 98.1 F (36.7 C), temperature source Oral, resp. rate 18, height 5\' 4"  (1.626 m), weight 103.4 kg, SpO2 97 %. Gen: no distress, normal appearing HEENT: oral mucosa pink and moist, NCAT Cardio: Reg rate Chest: normal effort, normal rate of breathing Abd: soft, non-distended Ext: no edema Psych: pleasant, normal affect Skin: intact Neuro:  MS: very tight muscles in scalenes and splenius capitus B/L , however very loose in levators and upper traps and supraspinatus B/L- Skin:  incision looks OK- no increased heat, no drainage seen- dressing into hairline.     Assessment/Plan: 1. Functional deficits which require 3+ hours per day of interdisciplinary therapy in a comprehensive inpatient rehab setting. Physiatrist is providing close team supervision and 24 hour management of active medical problems listed below. Physiatrist and rehab team continue to assess barriers to discharge/monitor patient progress toward functional and medical goals  Care Tool:  Bathing    Body parts bathed by patient: Chest, Abdomen, Front perineal area, Left arm, Right arm, Left upper leg, Right upper leg, Face   Body parts bathed by helper: Buttocks, Left lower leg, Right lower leg     Bathing assist Assist Level: Moderate  Assistance - Patient 50 - 74%     Upper Body Dressing/Undressing Upper body dressing   What is the patient wearing?: Pull over shirt    Upper body assist Assist Level: Maximal Assistance - Patient 25 - 49%    Lower Body Dressing/Undressing Lower body dressing      What is the patient wearing?: Pants, Incontinence brief     Lower body assist Assist for lower body dressing: Maximal Assistance - Patient 25 - 49%     Toileting Toileting    Toileting assist Assist for toileting: Moderate Assistance - Patient 50 - 74%     Transfers Chair/bed transfer  Transfers assist  Chair/bed transfer activity did not occur: Refused  Chair/bed transfer assist level: Contact Guard/Touching assist     Locomotion Ambulation   Ambulation assist   Ambulation activity did not occur: Safety/medical concerns  Assist level: Contact Guard/Touching assist Assistive device: Walker-rolling Max distance: 90'   Walk 10 feet activity   Assist  Walk 10 feet activity did not occur: Safety/medical concerns  Assist level: Contact Guard/Touching assist Assistive device: Walker-rolling   Walk 50 feet activity   Assist Walk 50 feet with 2 turns activity did not occur: Safety/medical concerns  Assist level: Contact Guard/Touching assist Assistive device: Walker-rolling    Walk 150 feet  activity   Assist Walk 150 feet activity did not occur: Safety/medical concerns         Walk 10 feet on uneven surface  activity   Assist Walk 10 feet on uneven surfaces activity did not occur: Safety/medical concerns         Wheelchair     Assist Is the patient using a wheelchair?: No (TBD)             Wheelchair 50 feet with 2 turns activity    Assist            Wheelchair 150 feet activity     Assist          Blood pressure (!) 114/42, pulse 83, temperature 98.1 F (36.7 C), temperature source Oral, resp. rate 18, height 5\' 4"  (1.626 m), weight 103.4 kg,  SpO2 97 %.  Medical Problem List and Plan: 1.  Anterior and posterior C1 ring fracture s/p occiput to C5 fusion             -patient may shower but incision must be covered             -ELOS/Goals: 10-14 days S             -Admit to CIR  -first day of evaluations- will do ASIA exam Thursday  -Continue PT and OT- do ASIA early next week.   2.  Antithrombotics: -DVT/anticoagulation:  Mechanical: Sequential compression devices, below knee Bilateral lower extremities 9/1- has been 7 days since surgery- ill start Lovenox- GFR is 22- so will start SQ heparin for now and check with pharmacy.              -antiplatelet therapy: N/A 3. Pain: continue Oxycodone prn-->decrease to 5 mg prn.              --On Gabapentin 300 mg bid  8/30- pt reports a HA this AM, but reports Oxy helps- doesn't want higher dose- ?makes her hallucinate". Will con't current dosing  9/1- pt reports pain horrific- appears like muscle spasms of neck- will d/c flexeril and add Zanaflex 2 mg TID- con't opiate dosing. 9/2- pain is better- still sore- but explained she had neck surgery- expects to have some pain.   4. Mood: LCSW to follow for evaluation and support.              -antipsychotic agents: N/a 5. Neuropsych: This patient is not fully capable of making decisions on her own behalf. 6. Skin/Wound Care: Monitor wound for healing.  7. Fluids/Electrolytes/Nutrition: Monitor I/O. Check lytes in am.  8. HTN: Monitor BID --on demadex, metoprolol,   9/2- BP controlle-d con't regimen 9. CKD: Question baseline --SCr-3.06 at admission.   8/30- pt's Cr up from 2.08 to 2.24- will encourage fluids and recheck in AM- wait on d/c'ing IV since Cr is up  8/31- Cr 1.99- and BUN 42- down from 2.24- will wait on IVFs since at baseline today  9/1- recheck on Monday  10.  Hypokalemia: K+ has been progressing down--will recheck today and supplement  8/30- K+ 3.4 this AM- will replete and recheck in AM  8/31- K+ 4.0- much improved 11. H/o  panic attacks/anxiety d/o: Decrease Xanax to HS, continue Vistaril tid prn 12.  H/o asthma: Continue Breo.  13. T2DM: Hgb A1C-6.2. Well controlled on Tulicity every Sun-->-will have pt ask son to bring it from home.  --Monitor BS ac/hs. Carb modified diet.  2/72- out of Trulicity- will need son to bring  in- BG's doing well- con't regimen 9/1- did get trulicity this week- BG's look great- con't regimen 14. 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 Follows with Dr. [facility]. Check ammonia level given confusion 15. Confusion s/p falls: CT head ordered.    8/30- CT was OK, except "aging brain" and small R parietal scalp hematoma- will monitor  9/4: mental status much improved, continue to monitor 16. Neurogenic bowel and bladder  8/31- is requiring cathing sometimes and has bowel incontinence with sorbitol- if continues, will place bowel program- will see if need to start Flomax?  9/1- No incontinence in last 24 hours of urine- and bladder scans <180cc- wait on any meds. Bowel incontinence could have been due to Sorbitol?  9/2- pt reports still having BM's from the sorbitol- explained she was full of stool- and should improve.   9/4: diarrhea improved- discussed high fiber diet to help avoid future constipation.  17. Hypotension: diastolic down to 42: decrease Toprol to 175mg .    LOS: 6 days A FACE TO FACE EVALUATION WAS PERFORMED  Jace Dowe P Fallou Hulbert 06/15/2021, 12:12 PM

## 2021-06-15 NOTE — Progress Notes (Signed)
Physical Therapy Session Note  Patient Details  Name: Becky Hobbs MRN: [id number] Date of Birth: [birth year]  Today's Date: 06/15/2021 PT Individual Time: 8341-9622; 1620-1700 PT Individual Time Calculation (min): 55 min and 40 min  Short Term Goals: Week 1:  PT Short Term Goal 1 (Week 1): Pt will perform least restrictive transfer with min A consistently PT Short Term Goal 2 (Week 1): Pt will initiate gait training as safe and able PT Short Term Goal 3 (Week 1): Pt will tolerate sitting OOB x 1 hour PT Short Term Goal 4 (Week 1): Pt will initiate stair training as safe and able  Skilled Therapeutic Interventions/Progress Updates:    Session 1: Pt received supine in bed, agreeable to PT session. Pt reports pain in her neck and shoulders, nursing unavailable during session for pain medication administration. Utilized repositioning and distraction techniques during session for pain management. Pt also requesting to wear her hard cervical collar for comfort and improved ability to keep head upright. Allowed patient to wear collar during session, education regarding collar wear and importance of spending time not wearing collar as well to allow cervical muscles to strengthen. Supine to sit with CGA, HOB slightly elevated and use of bedrail. Sit to stand with CGA to RW throughout session. Ambulatory transfer to toilet with RW and CGA. Pt requires assist to don new brief, otherwise setup A for clothing management and pericare. Assisted pt with donning shirt, pants, socks, and shoes in room. Ambulation 2 x 90 ft with RW and CGA for balance, improved endurance for gait this date. Ascend/descend 8 x 6" stairs with 2 handrails and CGA for balance, step-through gait pattern. Pt left seated in w/c in room handed off to OT for next session.  Session 2: Pt received seated in w/c in room, agreeable to PT session. Pt reports 6/10 pain in her neck, nursing able to provide pain medication at beginning of therapy  session. Pt also asking for her Trulicity, nursing reports waiting on orders to provide medication. Sit to stand with CGA to RW throughout session. Ambulation x 200 ft, x 100 ft with RW and CGA, improved endurance for gait this afternoon. Pt left seated in w/c in room with needs in reach, quick release belt and chair alarm in place.  Therapy Documentation Precautions:  Precautions Precautions: Fall, Cervical Precaution Comments: needs reminder for precautions 8/29; no brace needed Restrictions Weight Bearing Restrictions: No    Therapy/Group: Individual Therapy   Excell Seltzer, PT, DPT, CSRS  06/15/2021, 12:08 PM

## 2021-06-15 NOTE — Plan of Care (Signed)
  Problem: Consults Goal: RH GENERAL PATIENT EDUCATION Description: See Patient Education module for education specifics. Outcome: Progressing Goal: Skin Care Protocol Initiated - if Braden Score 18 or less Description: If consults are not indicated, leave blank or document N/A Outcome: Progressing   Problem: RH BOWEL ELIMINATION Goal: RH STG MANAGE BOWEL WITH ASSISTANCE Description: STG Manage Bowel with supervision Assistance. Outcome: Progressing Goal: RH STG MANAGE BOWEL W/MEDICATION W/ASSISTANCE Description: STG Manage Bowel with Medication with supervision Assistance. Outcome: Progressing   Problem: RH SKIN INTEGRITY Goal: RH STG ABLE TO PERFORM INCISION/WOUND CARE W/ASSISTANCE Description: STG Able To Perform Incision/Wound Care With supervision Assistance. Outcome: Progressing   Problem: RH SAFETY Goal: RH STG DECREASED RISK OF FALL WITH ASSISTANCE Description: STG Decreased Risk of Fall With cues and reminders. Outcome: Progressing   Problem: RH PAIN MANAGEMENT Goal: RH STG PAIN MANAGED AT OR BELOW PT'S PAIN GOAL Description: < 3 on a 0-10 pain scale. Outcome: Progressing   Problem: RH KNOWLEDGE DEFICIT GENERAL Goal: RH STG INCREASE KNOWLEDGE OF SELF CARE AFTER HOSPITALIZATION Description: Patient will demonstrate knowledge of medication management, pain management, safety precautions with educational materials and handouts provided by staff independently at discharge. Outcome: Progressing

## 2021-06-15 NOTE — Progress Notes (Signed)
Occupational Therapy Session Note  Patient Details  Name: Becky Hobbs MRN: [id number] Date of Birth: 11/08/1945  Today's Date: 06/15/2021 OT Individual Time: 1300-1330 OT Individual Time Calculation (min): 30 min    Short Term Goals: Week 1:  OT Short Term Goal 1 (Week 1): Pt will be able to manage clothing over hips with S pre toileting and slight A post toileting. OT Short Term Goal 2 (Week 1): Pt will sit to stand from [facility] or toilet with S. OT Short Term Goal 3 (Week 1): Pt will don shirt with set up. OT Short Term Goal 4 (Week 1): Pt will have improved sh ROM to reach across chest to wash under her arms more easily.  Skilled Therapeutic Interventions/Progress Updates:    Pt greeted in the w/c with her sister Becky Hobbs present. [name] had questions about showering at home. Practiced walk-in shower transfers using RW, OT first demonstrating with pt and then [facility] practicing with pt. Education provided on nonslip treads for the shower floor, always wearing nonslip footwear in/out of shower, and to provide pt with full supervision during ADL routine. [name] verbalized understanding, states pt had supervision for showers PTA but would try to shower by herself often. Already have a wide shower chair. Pt adamant that she is going to drive once she gets home. Discussed safety implications given her c-precautions and advised her to continue this conversation with MD at a later date. Pt remained in the w/c at close of session, all needs within reach and safety belt fastened. Tx focus placed on d/c planning + pt/caregiver education.   Therapy Documentation Precautions:  Precautions Precautions: Fall, Cervical Precaution Comments: needs reminder for precautions 8/29; no brace needed Restrictions Weight Bearing Restrictions: No Vital Signs: Therapy Vitals Temp: 97.8 F (36.6 C) Temp Source: Oral Pulse Rate: 76 Resp: 14 BP: (!) 95/50 Patient Position (if appropriate): Sitting Oxygen Therapy SpO2:  99 % O2 Device: Room Air Pain: pt reported she was going to ask RN for pain medicine at end of session. Pt used the c-collar for neck support/pain mgt during session   ADL: ADL Eating: Set up Grooming: Setup Upper Body Bathing: Supervision/safety Where Assessed-Upper Body Bathing: Chair Lower Body Bathing: Moderate assistance Where Assessed-Lower Body Bathing: Chair Upper Body Dressing: Maximal assistance Where Assessed-Upper Body Dressing: Chair Lower Body Dressing: Maximal assistance Where Assessed-Lower Body Dressing: Chair Toileting: Maximal assistance Where Assessed-Toileting: Bedside Commode Toilet Transfer: Minimal assistance Toilet Transfer Method: Stand pivot Toilet Transfer Equipment: Bedside commode     Therapy/Group: Individual Therapy  Tyiana Hill A Clyde Zarrella 06/15/2021, 3:50 PM

## 2021-06-16 ENCOUNTER — Inpatient Hospital Stay (HOSPITAL_COMMUNITY): Payer: Medicare Other

## 2021-06-16 DIAGNOSIS — D72829 Elevated white blood cell count, unspecified: Secondary | ICD-10-CM

## 2021-06-16 LAB — BASIC METABOLIC PANEL
Anion gap: 20 — ABNORMAL HIGH (ref 5–15)
Anion gap: 14 (ref 5–15)
BUN: 84 mg/dL — ABNORMAL HIGH (ref 8–23)
BUN: 83 mg/dL — ABNORMAL HIGH (ref 8–23)
CO2: 26 mmol/L (ref 22–32)
CO2: 28 mmol/L (ref 22–32)
Calcium: 10.3 mg/dL (ref 8.9–10.3)
Calcium: 10 mg/dL (ref 8.9–10.3)
Chloride: 87 mmol/L — ABNORMAL LOW (ref 98–111)
Chloride: 90 mmol/L — ABNORMAL LOW (ref 98–111)
Creatinine, Ser: 2.95 mg/dL — ABNORMAL HIGH (ref 0.44–1.00)
Creatinine, Ser: 3.12 mg/dL — ABNORMAL HIGH (ref 0.44–1.00)
GFR, Estimated: 16 mL/min — ABNORMAL LOW (ref >60)
GFR, Estimated: 15 mL/min — ABNORMAL LOW (ref >60)
Glucose, Bld: 140 mg/dL — ABNORMAL HIGH (ref 70–99)
Glucose, Bld: 134 mg/dL — ABNORMAL HIGH (ref 70–99)
Potassium: 2.8 mmol/L — ABNORMAL LOW (ref 3.5–5.1)
Potassium: 3.4 mmol/L — ABNORMAL LOW (ref 3.5–5.1)
Sodium: 133 mmol/L — ABNORMAL LOW (ref 135–145)
Sodium: 132 mmol/L — ABNORMAL LOW (ref 135–145)

## 2021-06-16 LAB — GLUCOSE, CAPILLARY
Glucose-Capillary: 151 mg/dL — ABNORMAL HIGH (ref 70–99)
Glucose-Capillary: 158 mg/dL — ABNORMAL HIGH (ref 70–99)
Glucose-Capillary: 155 mg/dL — ABNORMAL HIGH (ref 70–99)
Glucose-Capillary: 90 mg/dL (ref 70–99)

## 2021-06-16 LAB — URINALYSIS, ROUTINE W REFLEX MICROSCOPIC
Bilirubin Urine: NEGATIVE
Glucose, UA: NEGATIVE mg/dL
Ketones, ur: NEGATIVE mg/dL
Nitrite: POSITIVE — AB
Protein, ur: NEGATIVE mg/dL
Specific Gravity, Urine: 1.009 (ref 1.005–1.030)
WBC, UA: >50 WBC/hpf — ABNORMAL HIGH (ref 0–5)
pH: 6 (ref 5.0–8.0)

## 2021-06-16 LAB — CBC
HCT: 30.7 % — ABNORMAL LOW (ref 36.0–46.0)
Hemoglobin: 10 g/dL — ABNORMAL LOW (ref 12.0–15.0)
MCH: 28 pg (ref 26.0–34.0)
MCHC: 32.6 g/dL (ref 30.0–36.0)
MCV: 86 fL (ref 80.0–100.0)
Platelets: 365 10*3/uL (ref 150–400)
RBC: 3.57 MIL/uL — ABNORMAL LOW (ref 3.87–5.11)
RDW: 14.8 % (ref 11.5–15.5)
WBC: 15.5 10*3/uL — ABNORMAL HIGH (ref 4.0–10.5)
nRBC: 0 % (ref 0.0–0.2)

## 2021-06-16 MED ORDER — POTASSIUM CHLORIDE CRYS ER 20 MEQ PO TBCR
40.0000 meq | EXTENDED_RELEASE_TABLET | Freq: Three times a day (TID) | ORAL | Status: AC
  Administered 2021-06-16 – 2021-06-17 (×3): 40 meq via ORAL
  Filled 2021-06-16 (×3): qty 2

## 2021-06-16 MED ORDER — SODIUM CHLORIDE 0.9 % IV SOLN: INTRAVENOUS | Status: DC

## 2021-06-16 MED ORDER — POTASSIUM CHLORIDE CRYS ER 20 MEQ PO TBCR
20.0000 meq | EXTENDED_RELEASE_TABLET | Freq: Every day | ORAL | Status: DC
  Administered 2021-06-17 – 2021-06-19 (×3): 20 meq via ORAL
  Filled 2021-06-16 (×3): qty 1

## 2021-06-16 NOTE — Progress Notes (Signed)
PROGRESS NOTE   Subjective/Complaints: Pt reports still having diarrhea ever since had Sorbitol mid last week. However per chart, I cannot see a BM documented since 9/3.   Did say poop "goes right through her".  Also sleepy- didn't sleep well last night.   Neck pain OK at rest.  More with movement.   Ritalin has really helped pt per PT.        ROS:  Pt denies SOB, abd pain, CP, N/V/C/D, and vision changes   Objective:   No results found. Recent Labs    06/16/21 0538  WBC 15.5*  HGB 10.0*  HCT 30.7*  PLT 365    Recent Labs    06/16/21 0538  NA 133*  K 2.8*  CL 87*  CO2 26  GLUCOSE 140*  BUN 84*  CREATININE 2.95*  CALCIUM 10.3     Intake/Output Summary (Last 24 hours) at 06/16/2021 1447 Last data filed at 06/16/2021 0700 Gross per 24 hour  Intake 120 ml  Output --  Net 120 ml        Physical Exam: Vital Signs Blood pressure (!) 126/50, pulse 78, temperature 98 F (36.7 C), temperature source Oral, resp. rate 16, height 5\' 4"  (1.626 m), weight 103.4 kg, SpO2 99 %.    General: awake, alert, appropriate,  keeps eyes closed, but can open them; NAD HENT: conjugate gaze; oropharynx moist CV: regular rate; no JVD Pulmonary: CTA B/L; no W/R/R- good air movement GI: soft, NT, ND, (+)BS; hyperactive Psychiatric: appropriate Neurological: alert  MS: very tight muscles in scalenes and splenius capitus B/L , however very loose in levators and upper traps and supraspinatus B/L-lidocaine patches in place Skin:  incision looks OK- no increased heat, no drainage seen- dressing into hairline.     Assessment/Plan: 1. Functional deficits which require 3+ hours per day of interdisciplinary therapy in a comprehensive inpatient rehab setting. Physiatrist is providing close team supervision and 24 hour management of active medical problems listed below. Physiatrist and rehab team continue to assess barriers  to discharge/monitor patient progress toward functional and medical goals  Care Tool:  Bathing    Body parts bathed by patient: Chest, Abdomen, Front perineal area, Left arm, Right arm, Left upper leg, Right upper leg, Face, Right lower leg, Left lower leg   Body parts bathed by helper: Buttocks     Bathing assist Assist Level: Minimal Assistance - Patient > 75%     Upper Body Dressing/Undressing Upper body dressing   What is the patient wearing?: Pull over shirt    Upper body assist Assist Level: Moderate Assistance - Patient 50 - 74%    Lower Body Dressing/Undressing Lower body dressing      What is the patient wearing?: Pants, Incontinence brief     Lower body assist Assist for lower body dressing: Maximal Assistance - Patient 25 - 49%     Toileting Toileting    Toileting assist Assist for toileting: Moderate Assistance - Patient 50 - 74%     Transfers Chair/bed transfer  Transfers assist  Chair/bed transfer activity did not occur: Refused  Chair/bed transfer assist level: Contact Guard/Touching assist     Locomotion Ambulation  Ambulation assist   Ambulation activity did not occur: Safety/medical concerns  Assist level: Contact Guard/Touching assist Assistive device: Walker-rolling Max distance: 200'   Walk 10 feet activity   Assist  Walk 10 feet activity did not occur: Safety/medical concerns  Assist level: Contact Guard/Touching assist Assistive device: Walker-rolling   Walk 50 feet activity   Assist Walk 50 feet with 2 turns activity did not occur: Safety/medical concerns  Assist level: Contact Guard/Touching assist Assistive device: Walker-rolling    Walk 150 feet activity   Assist Walk 150 feet activity did not occur: Safety/medical concerns  Assist level: Contact Guard/Touching assist Assistive device: Walker-rolling    Walk 10 feet on uneven surface  activity   Assist Walk 10 feet on uneven surfaces activity did not  occur: Safety/medical concerns         Wheelchair     Assist Is the patient using a wheelchair?: No (TBD)             Wheelchair 50 feet with 2 turns activity    Assist            Wheelchair 150 feet activity     Assist          Blood pressure (!) 126/50, pulse 78, temperature 98 F (36.7 C), temperature source Oral, resp. rate 16, height 5\' 4"  (1.626 m), weight 103.4 kg, SpO2 99 %.  Medical Problem List and Plan: 1.  Anterior and posterior C1 ring fracture s/p occiput to C5 fusion             -patient may shower but incision must be covered             -ELOS/Goals: 10-14 days S             -Admit to CIR  -first day of evaluations- will do ASIA exam Thursday  -con't PT and OT  2.  Antithrombotics: -DVT/anticoagulation:  Mechanical: Sequential compression devices, below knee Bilateral lower extremities 9/1- has been 7 days since surgery- ill start Lovenox- GFR is 22- so will start SQ heparin for now and check with pharmacy.              -antiplatelet therapy: N/A 3. Pain: continue Oxycodone prn-->decrease to 5 mg prn.              --On Gabapentin 300 mg bid  8/30- pt reports a HA this AM, but reports Oxy helps- doesn't want higher dose- ?makes her hallucinate". Will con't current dosing  9/1- pt reports pain horrific- appears like muscle spasms of neck- will d/c flexeril and add Zanaflex 2 mg TID- con't opiate dosing. 9/2- pain is better- still sore- but explained she had neck surgery- expects to have some pain.   4. Mood: LCSW to follow for evaluation and support.              -antipsychotic agents: N/a 5. Neuropsych: This patient is not fully capable of making decisions on her own behalf. 6. Skin/Wound Care: Monitor wound for healing.  7. Fluids/Electrolytes/Nutrition: Monitor I/O. Check lytes in am.  8. HTN: Monitor BID --on demadex, metoprolol,   9/2- BP controlle-d con't regimen 9. CKD: Question baseline --SCr-3.06 at admission.   8/30- pt's Cr  up from 2.08 to 2.24- will encourage fluids and recheck in AM- wait on d/c'ing IV since Cr is up  8/31- Cr 1.99- and BUN 42- down from 2.24- will wait on IVFs since at baseline today  9/1- recheck on Monday  9/5- Cr 2.95- start IVFs 75 cc/hour- x 1 day and recheck again today and tomorrow- to make sure is hers. BUN 84 and Cr 2.95 10.  Hypokalemia: K+ has been progressing down--will recheck today and supplement  8/30- K+ 3.4 this AM- will replete and recheck in AM  8/31- K+ 4.0- much improved  9/5- will recheck, but K+ 2.8- will recheck again- and order KCL 40 mEq x3.  11. H/o panic attacks/anxiety d/o: Decrease Xanax to HS, continue Vistaril tid prn 12.  H/o asthma: Continue Breo.  13. T2DM: Hgb A1C-6.2. Well controlled on Tulicity every Sun-->-will have pt ask son to bring it from home.  --Monitor BS ac/hs. Carb modified diet.  5/95- out of Trulicity- will need son to bring in- BG's doing well- con't regimen 9/1- did get trulicity this week- BG's look great- con't regimen 14. 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 Follows with Dr[facility]. Check ammonia level given confusion 15. Confusion s/p falls: CT head ordered.    8/30- CT was OK, except "aging brain" and small R parietal scalp hematoma- will monitor  9/4: mental status much improved, continue to monitor 16. Neurogenic bowel and bladder  8/31- is requiring cathing sometimes and has bowel incontinence with sorbitol- if continues, will place bowel program- will see if need to start Flomax?  9/1- No incontinence in last 24 hours of urine- and bladder scans <180cc- wait on any meds. Bowel incontinence could have been due to Sorbitol?  9/2- pt reports still having BM's from the sorbitol- explained she was full of stool- and should improve.   9/4: diarrhea improved- discussed high fiber diet to help avoid future constipation.   9/5-  17. Hypotension: diastolic down to 42: decrease Toprol to 175mg .   18. Leukocytosis  9/5- WBC 15.5- labs are so off and pt looks OK, so will recheck- now and AM- and ordered U/A and Cx- lungs sounds clear, but will check at U/A first and go from there.   I spent a total of 38 minutes on total care- >50% on coordination of care- d/w PA and going over labs personally and following back up about U/A and Cx.   LOS: 7 days A FACE TO FACE EVALUATION WAS PERFORMED  Allysa Governale 06/16/2021, 2:47 PM

## 2021-06-16 NOTE — Progress Notes (Signed)
Patient ordered UA. No sample able to be taken by end of shift. Report give to receiving nurse to proceed with getting sample. Sanda Linger, LPN

## 2021-06-16 NOTE — Progress Notes (Signed)
Urine sample collected and sent to the lab. Sanda Linger, LPN

## 2021-06-16 NOTE — Progress Notes (Signed)
Physical Therapy Session Note  Patient Details  Name: Becky Hobbs MRN: [id number] Date of Birth: 1946-07-24  Today's Date: 06/16/2021 PT Individual Time: 1300-1345; 1545-1700 PT Individual Time Calculation (min): 45 min and 75 min PT Amount of Missed Time (min): 15 Minutes PT Missed Treatment Reason: Unavailable (Comment) (eating lunch)  Short Term Goals: Week 1:  PT Short Term Goal 1 (Week 1): Pt will perform least restrictive transfer with min A consistently PT Short Term Goal 2 (Week 1): Pt will initiate gait training as safe and able PT Short Term Goal 3 (Week 1): Pt will tolerate sitting OOB x 1 hour PT Short Term Goal 4 (Week 1): Pt will initiate stair training as safe and able  Skilled Therapeutic Interventions/Progress Updates:    Session 1: Pt received seated in w/c in room eating lunch, pt given time to finish eating. Pt missed 15 minutes of scheduled therapy session to allow time to finish eating. No complaints of pain at this time. Sit to stand with CGA to RW throughout session. Ambulatory transfer to toilet with RW and CGA. Pt is setup A for pericare, assist needed to change brief. Ambulation 2 x 200 ft with RW and CGA. Ascend/descend 8 x 6" stairs with one handrail laterally with CGA, x 2 reps. Pt left seated in w/c in room with needs in reach, quick release belt and chair alarm in place at end of session.  Session 2: Pt received seated in w/c in room, agreeable to PT session. No complaints of pain. Sit to stand with RW and close Supervision to CGA throughout session. Ambulation to/from therapy gym with RW and CGA. Session focus on standing balance with and without RW support. Static stance with no UE support and CGA shooting basketball hoops, progression to stance on airex with intermittent support from RW and min A while shooting hoops due to frequent LOB posteriorly. Pt able to correct balance with cues for ankle and hip strategy. Sit to stand x 10 reps with no UE support and  CGA. Trial ambulation with no AD 2 x 100 ft with CGA for balance, no LOB noted. Static stance performing rebounder ball toss with Romberg stance x 15 reps, L/R modified tandem stance x 15 reps each with CGA for balance. Standing alt L/R 4" step-ups x 15 reps B with min A for balance, increased difficulty with LLE as compared to RLE and with descent. Sit to/from supine on real bed in therapy apartment with Supervision. Per pt report her bed at home is tall and she uses step stool to get in/out. Pt also reports she has fallen out of her bed at home several times, recommending pt get shorter bed. Pt left seated in w/c in room in care of nursing at end of session.  Therapy Documentation Precautions:  Precautions Precautions: Fall, Cervical Precaution Comments: needs reminder for precautions 8/29; no brace needed Restrictions Weight Bearing Restrictions: No General: PT Amount of Missed Time (min): 15 Minutes PT Missed Treatment Reason: Unavailable (Comment) (eating lunch)     Therapy/Group: Individual Therapy   Excell Seltzer, PT, DPT, CSRS  06/16/2021, 5:01 PM

## 2021-06-16 NOTE — Progress Notes (Signed)
Occupational Therapy Weekly Progress Note  Patient Details  Name: Becky Hobbs MRN: [id number] Date of Birth: [birth year]  Beginning of progress report period: June 10, 2021 End of progress report period: June 16, 2021   Patient has met 1 of 4 short term goals.  Pt is progressing toward OT goals and is performing stand pivot transfers with CGA and RW, Mod A for UB dressing d/t cervical precautions and limited ROM in BUEs, Max A for LB dressing. Pt does fatigue with activity but did complete a shower on 9/4. Patient education/training has been ongoing for compensatory/adaptive techniques and AE. Note the pt's neck pain has improved and consequently performing functional mobility and transfers with improved skill performance.   Patient continues to demonstrate the following deficits: muscle weakness, decreased cardiorespiratoy endurance, impaired timing and sequencing, decreased coordination, and decreased motor planning, and decreased sitting balance, decreased standing balance, decreased postural control, and decreased balance strategies and therefore will continue to benefit from skilled OT intervention to enhance overall performance with BADL and Reduce care partner burden.  Patient progressing toward long term goals..  Continue plan of care.  OT Short Term Goals Week 1:  OT Short Term Goal 1 (Week 1): Pt will be able to manage clothing over hips with S pre toileting and slight A post toileting. OT Short Term Goal 1 - Progress (Week 1): Progressing toward goal OT Short Term Goal 2 (Week 1): Pt will sit to stand from [facility] or toilet with S. OT Short Term Goal 2 - Progress (Week 1): Met OT Short Term Goal 3 (Week 1): Pt will don shirt with set up. OT Short Term Goal 3 - Progress (Week 1): Progressing toward goal OT Short Term Goal 4 (Week 1): Pt will have improved sh ROM to reach across chest to wash under her arms more easily. OT Short Term Goal 4 - Progress (Week 1): Progressing toward  goal Week 2:  OT Short Term Goal 1 (Week 2): STGs = LTGs d/t ELOS    Therapy Documentation Precautions:  Precautions Precautions: Fall, Cervical Precaution Comments: needs reminder for precautions 8/29; no brace needed Restrictions Weight Bearing Restrictions: No    Therapy/Group: Individual Therapy  Viona Gilmore 06/16/2021, 7:19 AM

## 2021-06-16 NOTE — Progress Notes (Signed)
Occupational Therapy Session Note  Patient Details  Name: Becky Hobbs MRN: [id number] Date of Birth: [birth year]  Today's Date: 06/16/2021 OT Individual Time: 6606-3016 OT Individual Time Calculation (min): 42 min    Short Term Goals: Week 1:  OT Short Term Goal 1 (Week 1): Pt will be able to manage clothing over hips with S pre toileting and slight A post toileting. OT Short Term Goal 2 (Week 1): Pt will sit to stand from [facility] or toilet with S. OT Short Term Goal 3 (Week 1): Pt will don shirt with set up. OT Short Term Goal 4 (Week 1): Pt will have improved sh ROM to reach across chest to wash under her arms more easily.   Skilled Therapeutic Interventions/Progress Updates:    Pt greeted at time of session semireclined with nursing present finishing up med pass. No pain initially but some discomfort in neck during standing/mobility improves with rest. Supine > sit Supervision with bed rail. Sitting EOB pt taking meds without difficulty. Donned socks for the pt for time management, pt insisting on wearing C collar today as she wore it yesterday and it made her "feel better" but is aware she does not have to wear per orders. Pt declined ADL tasks since showered and put on clothes yesterday, did not need to toilet. Ambulated bed > sink > chair CGA/close supervision with RW. Standing for oral hygiene and face washing with Supervision. Seated BUE stretches within precautions for scap squeezes and retraction to promote ROM and decrease stiffness. Pt up in wheelchair alarm on call bell in reach.   Therapy Documentation Precautions:  Precautions Precautions: Fall, Cervical Precaution Comments: needs reminder for precautions 8/29; no brace needed Restrictions Weight Bearing Restrictions: No     Therapy/Group: Individual Therapy  Viona Gilmore 06/16/2021, 10:17 AM

## 2021-06-17 LAB — CBC WITH DIFFERENTIAL/PLATELET
Abs Immature Granulocytes: 0.3 10*3/uL — ABNORMAL HIGH (ref 0.00–0.07)
Basophils Absolute: 0.1 10*3/uL (ref 0.0–0.1)
Basophils Relative: 1 %
Eosinophils Absolute: 0.7 10*3/uL — ABNORMAL HIGH (ref 0.0–0.5)
Eosinophils Relative: 5 %
HCT: 28.5 % — ABNORMAL LOW (ref 36.0–46.0)
Hemoglobin: 9.1 g/dL — ABNORMAL LOW (ref 12.0–15.0)
Immature Granulocytes: 2 %
Lymphocytes Relative: 20 %
Lymphs Abs: 2.6 10*3/uL (ref 0.7–4.0)
MCH: 27.7 pg (ref 26.0–34.0)
MCHC: 31.9 g/dL (ref 30.0–36.0)
MCV: 86.6 fL (ref 80.0–100.0)
Monocytes Absolute: 1 10*3/uL (ref 0.1–1.0)
Monocytes Relative: 8 %
Neutro Abs: 8 10*3/uL — ABNORMAL HIGH (ref 1.7–7.7)
Neutrophils Relative %: 64 %
Platelets: 326 10*3/uL (ref 150–400)
RBC: 3.29 MIL/uL — ABNORMAL LOW (ref 3.87–5.11)
RDW: 14.9 % (ref 11.5–15.5)
WBC: 12.6 10*3/uL — ABNORMAL HIGH (ref 4.0–10.5)
nRBC: 0 % (ref 0.0–0.2)

## 2021-06-17 LAB — BASIC METABOLIC PANEL
Anion gap: 10 (ref 5–15)
BUN: 74 mg/dL — ABNORMAL HIGH (ref 8–23)
CO2: 26 mmol/L (ref 22–32)
Calcium: 9.1 mg/dL (ref 8.9–10.3)
Chloride: 98 mmol/L (ref 98–111)
Creatinine, Ser: 2.66 mg/dL — ABNORMAL HIGH (ref 0.44–1.00)
GFR, Estimated: 18 mL/min — ABNORMAL LOW (ref >60)
Glucose, Bld: 141 mg/dL — ABNORMAL HIGH (ref 70–99)
Potassium: 3.9 mmol/L (ref 3.5–5.1)
Sodium: 134 mmol/L — ABNORMAL LOW (ref 135–145)

## 2021-06-17 LAB — GLUCOSE, CAPILLARY
Glucose-Capillary: 160 mg/dL — ABNORMAL HIGH (ref 70–99)
Glucose-Capillary: 122 mg/dL — ABNORMAL HIGH (ref 70–99)
Glucose-Capillary: 72 mg/dL (ref 70–99)

## 2021-06-17 MED ORDER — CEPHALEXIN 250 MG PO CAPS
250.0000 mg | ORAL_CAPSULE | Freq: Three times a day (TID) | ORAL | Status: DC
  Administered 2021-06-17 – 2021-06-19 (×6): 250 mg via ORAL
  Filled 2021-06-17 (×6): qty 1

## 2021-06-17 MED ORDER — TOPIRAMATE 25 MG PO TABS
25.0000 mg | ORAL_TABLET | Freq: Every day | ORAL | Status: DC
  Administered 2021-06-17 – 2021-06-23 (×7): 25 mg via ORAL
  Filled 2021-06-17 (×7): qty 1

## 2021-06-17 MED ORDER — SULFAMETHOXAZOLE-TRIMETHOPRIM 400-80 MG PO TABS
1.0000 | ORAL_TABLET | Freq: Two times a day (BID) | ORAL | Status: DC
  Filled 2021-06-17: qty 1

## 2021-06-17 MED ORDER — SODIUM CHLORIDE 0.9 % IV SOLN: INTRAVENOUS | Status: AC

## 2021-06-17 NOTE — Patient Care Conference (Signed)
Inpatient RehabilitationTeam Conference and Plan of Care Update Date: 06/17/2021   Time: 11:16 AM    Patient Name: Becky Hobbs      Medical Record Number: [id number]  Date of Birth: March 18, 1946 Sex: Female         Room/Bed: 4W19C/4W19C-01 Payor Info: Payor: MEDICARE / Plan: MEDICARE PART A AND B / Product Type: *No Product type* /    Admit Date/Time:  06/09/2021 12:42 PM  Primary Diagnosis:  Fracture of C1 vertebra, closed [facility])  Hospital Problems: Principal Problem:   Fracture of C1 vertebra, closed [facility])    Expected Discharge Date: Expected Discharge Date: 06/24/21  Team Members Present: Physician leading conference: Dr. Courtney Heys Social Worker Present: Loralee Pacas, [place] Nurse Present: Dorthula Nettles, RN PT Present: Excell Seltzer, PT OT Present: Roanna Epley, COTA;Jennifer Tamala Julian, OT PPS Coordinator present : Gunnar Fusi, SLP     Current Status/Progress Goal Weekly Team Focus  Bowel/Bladder   Continent of B/B; LBM 09/05  Pt remain cont of B/B  Assist with toileting needs prn   Swallow/Nutrition/ Hydration             ADL's   LB bathing/dressing-mod A: functional transfers-CGA; toileting-mod A  LB bathing/dressing with supervision; functional transfers-supervsion; toileting-mod I  activity toleracne, functional transfers, safety awareness, eduation   Mobility   Supervision bed mobility, Supervision to CGA transfers, Supervision to CGA gait x 200 ft RW, CGA stairs with one handrail  Supervision overall with LRAD  gait, balance, endurance   Communication             Safety/Cognition/ Behavioral Observations            Pain   No c/o pain  Remain pain free  Assess pain QS and prn   Skin   Surgical incision to posterior neck, honeycomb dressing in place  Remain free of new skin breakdown/infection  Assess skin QS and prn     Discharge Planning:  Pt to d/c to her home and will have assistance from her sister [name] for first two weeks, and then her son  Lennette Bihari will come down from [place].   Team Discussion: Has daily headache, adding Topamax, positive UTI, adding Keflex. Extra day of IVF, K+ 2.8 yesterday, better today. Having 1 bowel movement per day, requesting to go to bathroom for bladder. Pain at night with headache and interferes with sleep. Incision to back of neck is macerated. Lidocaine and muscle relaxer doing okay for neck and shoulder discomfort. Wants to wear collar because she is afraid to move her neck. Will schedule family education for sisters. Collar worn yesterday. Supervision bed mobility, contact guard transfers. Unsteady with Rolator. Needs lower body assist for ADL's. Showered over the weekend, mod/max assist for lower body. Patient on target to meet rehab goals: yes  *See Care Plan and progress notes for long and short-term goals.   Revisions to Treatment Plan:  MD adjusting medications.  Teaching Needs: Family education, medication management, pain management, skin/wound care, transfer training, gait training, balance training, endurance training, safety awareness.  Current Barriers to Discharge: Decreased caregiver support, Medical stability, Home enviroment access/layout, Neurogenic bowel and bladder, Wound care, Lack of/limited family support, Weight, Weight bearing restrictions, and Medication compliance  Possible Resolutions to Barriers: Continue current medications, provide emotional support.     Medical Summary Current Status: K was 2.8; WBC 15.5; BM daily- continent B/B?; voiding; HA's interfering with sleep; lidocaine patches and zanaflex helps muscles. afraid to move neck- wants collar;  Barriers  to Discharge: Decreased family/caregiver support;Behavior;Home enviroment access/layout;Neurogenic Bowel & Bladder;Medical stability;Weight;Wound care;Weight bearing restrictions  Barriers to Discharge Comments: family traiing to be scheduled- UTI' collar; dehydrated- on IVFs Possible Resolutions to [employer] Focus:  focus- added Keflex for UTI; IVF sfor 1 more day- labs in AM;  walking >227ft- topamax started-  for HA's- shower on weekend; con't Ritalin for now for wakefulness. Wa son phenteramine at home- d/c Tuesday 9/13   Continued Need for Acute Rehabilitation Level of Care: The patient requires daily medical management by a physician with specialized training in physical medicine and rehabilitation for the following reasons: Direction of a multidisciplinary physical rehabilitation program to maximize functional independence : Yes Medical management of patient stability for increased activity during participation in an intensive rehabilitation regime.: Yes Analysis of laboratory values and/or radiology reports with any subsequent need for medication adjustment and/or medical intervention. : Yes   I attest that I was present, lead the team conference, and concur with the assessment and plan of the team.   Cristi Loron 06/17/2021, 3:23 PM

## 2021-06-17 NOTE — Progress Notes (Addendum)
Patient ID: Becky Hobbs, female   DOB: Feb 26, 1946, 75 y.o.   MRN: [id number]  SW met with pt in room to provide updates from team conference while she was on the phone with her nephew; informed on d/c date 9/13. SW discussed family education with her sister [name]. Reports [name] will be here this afternoon. SW will come by this afternoon to discuss further. SW discussed [place] arrangement reported by physicians office, and pt declined [facility], and prefers [facility]. SW informed will explore if they are able to accept for services.   *SW met with pt and pt sister [name] to discuss family edu. Fam edu scheduled for Mon (9/11) 1pm-3pm with sisters [place] and [place].   SW sent HHPT/OT/aide/SN referral to [facility] and waiting on follow-up.  *confirms able to accept referral. SW updated [facility] on pt declined.   Loralee Pacas, MSW, [place] Office: [phone] Cell: [phone] Fax: [phone]

## 2021-06-17 NOTE — Discharge Instructions (Addendum)
COMMUNITY REFERRALS UPON DISCHARGE:    Home Health:   PT     OT     RN    SNA                    Agency:[facility]  Phone:[phone] *Please expect follow-up within 2-3 days of discharge to schedule your home visit. If you have not received follow-up, be sure to contact the branch directly.*    Medical Equipment/Items Ordered:                                                 Agency/Supplier:  Inpatient Rehab Discharge Instructions  Becky Hobbs Discharge date and time: No discharge date for patient encounter.   Activities/Precautions/ Functional Status: Activity: As tolerated Diet: Diabetic diet Wound Care: Routine skin checks Functional status:  ___ No restrictions     ___ Walk up steps independently ___ 24/7 supervision/assistance   ___ Walk up steps with assistance ___ Intermittent supervision/assistance  _x__ Bathe/dress independently ___ Walk with walker     ___ Bathe/dress with assistance ___ Walk Independently    ___ Shower independently ___ Walk with assistance    ___ Shower with assistance ___ No alcohol     ___ Return to work/school ________  COMMUNITY REFERRALS UPON DISCHARGE:    Home Health:   PT     OT        RN    SNA                 Agency:[facility] Phone:[phone] *Please expect follow-up within 2-3 days to schedule your home visit. If you have not received follow-up, please be sure to contact the branch directly.*   Special Instructions: No driving smoking or alcohol   My questions have been answered and I understand these instructions. I will adhere to these goals and the provided educational materials after my discharge from the hospital.  Patient/Caregiver Signature _______________________________ Date __________  Clinician Signature _______________________________________ Date __________  Please bring this form and your medication list with you to all your follow-up doctor's appointments.

## 2021-06-17 NOTE — Progress Notes (Signed)
Physical Therapy Weekly Progress Note  Patient Details  Name: Becky Hobbs MRN: [id number] Date of Birth: Apr 08, 1946  Beginning of progress report period: June 10, 2021 End of progress report period: June 17, 2021  Today's Date: 06/17/2021 PT Individual Time: 0815-0900 PT Individual Time Calculation (min): 45 min   Patient has met 4 of 4 short term goals.  Patient is making great progress towards therapy goals. She is currently Supervision level for bed mobility, CGA for transfers with RW, CGA for gait with RW up to 200 ft, and CGA for stair navigation with 1-2 handrails. Pt has made significant gains since initial evaluation with improved alertness and ability to participate in therapy session.  Patient continues to demonstrate the following deficits muscle weakness, decreased cardiorespiratoy endurance, and decreased standing balance, decreased postural control, decreased balance strategies, and difficulty maintaining precautions and therefore will continue to benefit from skilled PT intervention to increase functional independence with mobility.  Patient progressing toward long term goals..  Continue plan of care.  PT Short Term Goals Week 1:  PT Short Term Goal 1 (Week 1): Pt will perform least restrictive transfer with min A consistently PT Short Term Goal 1 - Progress (Week 1): Met PT Short Term Goal 2 (Week 1): Pt will initiate gait training as safe and able PT Short Term Goal 2 - Progress (Week 1): Met PT Short Term Goal 3 (Week 1): Pt will tolerate sitting OOB x 1 hour PT Short Term Goal 3 - Progress (Week 1): Met PT Short Term Goal 4 (Week 1): Pt will initiate stair training as safe and able PT Short Term Goal 4 - Progress (Week 1): Met Week 2:  PT Short Term Goal 1 (Week 2): =LTG due to ELOS  Skilled Therapeutic Interventions/Progress Updates:    Pt received seated in bed. Pt requesting therapist return in a few min as she is on the phone with her cell phone carrier  attempting to resolve a billing issue. Pt missed 15 min of scheduled therapy session for personal phone call. Upon therapist return pt agreeable to session, pt reports a headache. Nursing able to provide pain medication for headache during session. Supine to sit with Supervision. Pt able to change shirt while seated EOB with min A, pants with min A, and socks/shoes dependently for time conservation. Sit to stand with no AD and CGA this date. Ambulatory transfer into bathroom with no AD and CGA for balance. Toilet transfer with CGA and grab bar. Pt is able to perform pericare independently, assist needed to don new brief. Ambulation x 200 ft with rollator and CGA, decreased steadiness with use of rollator. Pt left seated in w/c in room with needs in reach at end of session.  Therapy Documentation Precautions:  Precautions Precautions: Fall, Cervical Precaution Comments: needs reminder for precautions 8/29; no brace needed Restrictions Weight Bearing Restrictions: No General: PT Amount of Missed Time (min): 15 Minutes PT Missed Treatment Reason: Patient unwilling to participate     Therapy/Group: Individual Therapy  Excell Seltzer, PT, DPT, CSRS  06/17/2021, 12:01 PM

## 2021-06-17 NOTE — Progress Notes (Signed)
Occupational Therapy Session Note  Patient Details  Name: Becky Hobbs MRN: [id number] Date of Birth: Aug 01, 1946  Today's Date: 06/17/2021 OT Individual Time: 6219-4712 and [id number]  OT Individual Time Calculation (min): 45 min  and 27 minToday's Date: 06/17/2021 OT Missed Time: 15 Minutes (AM session) Missed Time Reason: Other (comment) (on phone working through bill)   Short Term Goals: Week 1:  OT Short Term Goal 1 (Week 1): Pt will be able to manage clothing over hips with S pre toileting and slight A post toileting. OT Short Term Goal 1 - Progress (Week 1): Progressing toward goal OT Short Term Goal 2 (Week 1): Pt will sit to stand from [facility] or toilet with S. OT Short Term Goal 2 - Progress (Week 1): Met OT Short Term Goal 3 (Week 1): Pt will don shirt with set up. OT Short Term Goal 3 - Progress (Week 1): Progressing toward goal OT Short Term Goal 4 (Week 1): Pt will have improved sh ROM to reach across chest to wash under her arms more easily. OT Short Term Goal 4 - Progress (Week 1): Progressing toward goal Week 2:  OT Short Term Goal 1 (Week 2): STGs = LTGs d/t ELOS   Skilled Therapeutic Interventions/Progress Updates:    Session 1: Pt greeted at time of session sitting up in wheelchair agreeable to OT session, but on phone with phone service provider. While on hold, discussing ADL routine and pt declining shower today as she was instructed to let incision "breath" as tegaderm was left on from previous shower. Discussion with pt regarding technique to cover at home as well. Returning to phone call, pt requesting a few minutes before session to start to finishing working through phone bill. Returned 15 mins later and resumed session. Pt transported room <> ADL apartment and practiced walk in shower transfer with posterior entry method with Supervision/CGA using RW cues for  sequencing. Note pt hyperverbal throughout and requiring extended time for tasks. Wheelchair transport back to  room, Set up in room alarm on call bell in reach. Missed 15 mins of OT.  Session 2: Pt greeted at time of session sitting up in wheelchair with sister present, wanting to see how pt has been progressing. Pt declined toileting, but did perform functional mobility with RW and assist with IV pole throughout room and transferred to/from wheelchair CGA/close supervision. Note discussion with pt and family regarding progress, CLOF, DC planning, Supervision needed, and upcoming family ed. Pt up in wheelchair alarm on call bell in reach.   Therapy Documentation Precautions:  Precautions Precautions: Fall, Cervical Precaution Comments: needs reminder for precautions 8/29; no brace needed Restrictions Weight Bearing Restrictions: No     Therapy/Group: Individual Therapy  Viona Gilmore 06/17/2021, 7:13 AM

## 2021-06-17 NOTE — Progress Notes (Signed)
Occupational Therapy Session Note  Patient Details  Name: Becky Hobbs MRN: [id number] Date of Birth: 08-Feb-1946  Today's Date: 06/17/2021 OT Individual Time: 1405-1430 OT Individual Time Calculation (min): 25 min    Short Term Goals: Week 2:  OT Short Term Goal 1 (Week 2): STGs = LTGs d/t ELOS  Skilled Therapeutic Interventions/Progress Updates:    Pt resting in w/c upon arrival with IV running. OT intervention with focus on discharge planning and bathroom/home setup. Discussed use of AE and DME to facilitate BADLs and increase independence. Pt states she has all necessary DME and AE (reachers.) Pt pleased with progress and realizes she needs to stay the additional week to get stronger and for medical reasons. Pt remained in w/c with all needs within reach, belt alarm activated, and daughter present.   Therapy Documentation Precautions:  Precautions Precautions: Fall, Cervical Precaution Comments: needs reminder for precautions 8/29; no brace needed Restrictions Weight Bearing Restrictions: No Pain:  Pt reports her HA is better this afternoon  Therapy/Group: Individual Therapy  Leroy Libman 06/17/2021, 2:50 PM

## 2021-06-17 NOTE — Progress Notes (Signed)
PROGRESS NOTE   Subjective/Complaints:  Pt reports thumping HA daily in front of her head/forehead area- on and off, but every day since accident. Asking for preventative medicine.   Asking if might have UTI since had 1 last time had surgery- no Sx's of nausea or burning, etc, but thinks it could be cause.   Unhappy needs IVFs, but Cr down to 2.66 (max 3.12).  After IVFs- will con't 1 more day of IVFs.      ROS:  Pt denies SOB, abd pain, CP, N/V/C/D, and vision changes   Objective:   DG Chest 2 View  Result Date: 06/16/2021 CLINICAL DATA:  Elevated white blood cell count. EXAM: CHEST - 2 VIEW COMPARISON:  Most recent chest radiograph 02/09/2018 FINDINGS: The cardiomediastinal contours are normal for AP technique. Mild elevation of left hemidiaphragm is unchanged. Pulmonary vasculature is normal. Streaky atelectasis or scar at the left lung base. No consolidation, pleural effusion, or pneumothorax. No acute osseous abnormalities are seen. Thoracolumbar fusion hardware. Cervical hardware partially included. IMPRESSION: Streaky atelectasis or scar at the left lung base. No pneumonia. Electronically Signed   By: Keith Rake M.D.   On: 06/16/2021 18:35   Recent Labs    06/16/21 0538 06/17/21 0518  WBC 15.5* 12.6*  HGB 10.0* 9.1*  HCT 30.7* 28.5*  PLT 365 326    Recent Labs    06/16/21 1518 06/17/21 0518  NA 132* 134*  K 3.4* 3.9  CL 90* 98  CO2 28 26  GLUCOSE 134* 141*  BUN 83* 74*  CREATININE 3.12* 2.66*  CALCIUM 10.0 9.1     Intake/Output Summary (Last 24 hours) at 06/17/2021 0954 Last data filed at 06/17/2021 0700 Gross per 24 hour  Intake 360.65 ml  Output --  Net 360.65 ml        Physical Exam: Vital Signs Blood pressure (!) 113/36, pulse 84, temperature 98.3 F (36.8 C), resp. rate 15, height 5\' 4"  (1.626 m), weight 103.4 kg, SpO2 96 %.     General: awake, alert, appropriate, sitting up in  w/c with PT and nursing in room; on IVFs via IV;  NAD HENT: conjugate gaze; oropharynx moist CV: regular rate; no JVD Pulmonary: CTA B/L; no W/R/R- good air movement GI: soft, NT, ND, (+)BS Psychiatric: appropriate Neurological: Ox3; more alert/aware than normal since started Ritalin  MS: very tight muscles in scalenes and splenius capitus B/L , however very loose in levators and upper traps and supraspinatus B/L-lidocaine patches in place Skin:  incision looks OK-but very macerated- initially was covered with tegaderm and looked swollen- but resolved without tegaderm and by drying out some- no erythema, no drainage, no warmth   Assessment/Plan: 1. Functional deficits which require 3+ hours per day of interdisciplinary therapy in a comprehensive inpatient rehab setting. Physiatrist is providing close team supervision and 24 hour management of active medical problems listed below. Physiatrist and rehab team continue to assess barriers to discharge/monitor patient progress toward functional and medical goals  Care Tool:  Bathing    Body parts bathed by patient: Chest, Abdomen, Front perineal area, Left arm, Right arm, Left upper leg, Right upper leg, Face, Right lower leg, Left lower  leg   Body parts bathed by helper: Buttocks     Bathing assist Assist Level: Minimal Assistance - Patient > 75%     Upper Body Dressing/Undressing Upper body dressing   What is the patient wearing?: Pull over shirt    Upper body assist Assist Level: Moderate Assistance - Patient 50 - 74%    Lower Body Dressing/Undressing Lower body dressing      What is the patient wearing?: Pants, Incontinence brief     Lower body assist Assist for lower body dressing: Maximal Assistance - Patient 25 - 49%     Toileting Toileting    Toileting assist Assist for toileting: Moderate Assistance - Patient 50 - 74%     Transfers Chair/bed transfer  Transfers assist  Chair/bed transfer activity did not  occur: Refused  Chair/bed transfer assist level: Contact Guard/Touching assist     Locomotion Ambulation   Ambulation assist   Ambulation activity did not occur: Safety/medical concerns  Assist level: Contact Guard/Touching assist Assistive device: Walker-rolling Max distance: 200'   Walk 10 feet activity   Assist  Walk 10 feet activity did not occur: Safety/medical concerns  Assist level: Contact Guard/Touching assist Assistive device: Walker-rolling   Walk 50 feet activity   Assist Walk 50 feet with 2 turns activity did not occur: Safety/medical concerns  Assist level: Contact Guard/Touching assist Assistive device: Walker-rolling    Walk 150 feet activity   Assist Walk 150 feet activity did not occur: Safety/medical concerns  Assist level: Contact Guard/Touching assist Assistive device: Walker-rolling    Walk 10 feet on uneven surface  activity   Assist Walk 10 feet on uneven surfaces activity did not occur: Safety/medical concerns         Wheelchair     Assist Is the patient using a wheelchair?: No (TBD)             Wheelchair 50 feet with 2 turns activity    Assist            Wheelchair 150 feet activity     Assist          Blood pressure (!) 113/36, pulse 84, temperature 98.3 F (36.8 C), resp. rate 15, height 5\' 4"  (1.626 m), weight 103.4 kg, SpO2 96 %.  Medical Problem List and Plan: 1.  Anterior and posterior C1 ring fracture s/p occiput to C5 fusion             -patient may shower but incision must be covered             -ELOS/Goals: 10-14 days S             -Admit to CIR  -first day of evaluations- will do ASIA exam Thursday  -con't PT and OT/CIR_ determine exact d/c date at team conference 2.  Antithrombotics: -DVT/anticoagulation:  Mechanical: Sequential compression devices, below knee Bilateral lower extremities 9/1- has been 7 days since surgery- ill start Lovenox- GFR is 22- so will start SQ heparin  for now and check with pharmacy.              -antiplatelet therapy: N/A 3. Pain: continue Oxycodone prn-->decrease to 5 mg prn.              --On Gabapentin 300 mg bid  8/30- pt reports a HA this AM, but reports Oxy helps- doesn't want higher dose- ?makes her hallucinate". Will con't current dosing  9/1- pt reports pain horrific- appears like muscle spasms of neck- will d/c  flexeril and add Zanaflex 2 mg TID- con't opiate dosing. 9/5- main pain is HA and neck pain- neck pain controlled- con't regimen 4. Mood: LCSW to follow for evaluation and support.              -antipsychotic agents: N/a 5. Neuropsych: This patient is not fully capable of making decisions on her own behalf. 6. Skin/Wound Care: Monitor wound for healing.  7. Fluids/Electrolytes/Nutrition: Monitor I/O. Check lytes in am.  8. HTN: Monitor BID --on demadex, metoprolol,   9/2- BP controlle-d con't regimen 9. CKD: Question baseline --SCr-3.06 at admission.   8/30- pt's Cr up from 2.08 to 2.24- will encourage fluids and recheck in AM- wait on d/c'ing IV since Cr is up  8/31- Cr 1.99- and BUN 42- down from 2.24- will wait on IVFs since at baseline today  9/1- recheck on Monday   9/5- Cr 2.95- start IVFs 75 cc/hour- x 1 day and recheck again today and tomorrow- to make sure is hers. BUN 84 and Cr 2.95  9/6- Cr maxxed out at 3.12- likely UTI and dehydration- Cr down to 2.66- will con't NS IVFs x 1 more day and recheck in AM 10.  Hypokalemia: K+ has been progressing down--will recheck today and supplement  8/30- K+ 3.4 this AM- will replete and recheck in AM  8/31- K+ 4.0- much improved  9/5- will recheck, but K+ 2.8- will recheck again- and order KCL 40 mEq x3.   9/6- K= 3.9- doing much better 11. H/o panic attacks/anxiety d/o: Decrease Xanax to HS, continue Vistaril tid prn 12.  H/o asthma: Continue Breo.  13. T2DM: Hgb A1C-6.2. Well controlled on Tulicity every Sun-->-will have pt ask son to bring it from home.  --Monitor BS  ac/hs. Carb modified diet.  8/11- out of Trulicity- will need son to bring in- BG's doing well- con't regimen 9/1- did get trulicity this week- BG's look great- con't regimen 14. 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 Follows with Dr. [facility]. Check ammonia level given confusion 15. Confusion s/p falls: CT head ordered.    8/30- CT was OK, except "aging brain" and small R parietal scalp hematoma- will monitor  9/4: mental status much improved, continue to monitor 16. Neurogenic bowel and bladder  8/31- is requiring cathing sometimes and has bowel incontinence with sorbitol- if continues, will place bowel program- will see if need to start Flomax?  9/1- No incontinence in last 24 hours of urine- and bladder scans <180cc- wait on any meds. Bowel incontinence could have been due to Sorbitol?  9/2- pt reports still having BM's from the sorbitol- explained she was full of stool- and should improve.   9/4: diarrhea improved- discussed high fiber diet to help avoid future constipation.   9/5-  17. Hypotension: diastolic down to 42: decrease Toprol to 175mg .  18. Leukocytosis/UTI  9/5- WBC 15.5- labs are so off and pt looks OK, so will recheck- now and AM- and ordered U/A and Cx- lungs sounds clear, but will check at U/A first and go from there.   9/6 - WBC 12.6- but also diluted- will start Keflex 250 mg q8 hours per Pharmacy because no documentation of issues with cephalosporins.   I spent a total of 41 minutes on total care- >50% coordination of care- d/w pharmacy and PA and nursing and PT about her care- esp with IVF issues and labs/UTI.   LOS: 8 days A FACE TO FACE EVALUATION WAS PERFORMED  Sulay Brymer 06/17/2021, 9:54 AM

## 2021-06-18 LAB — BASIC METABOLIC PANEL
Anion gap: 8 (ref 5–15)
BUN: 54 mg/dL — ABNORMAL HIGH (ref 8–23)
CO2: 27 mmol/L (ref 22–32)
Calcium: 9.5 mg/dL (ref 8.9–10.3)
Chloride: 103 mmol/L (ref 98–111)
Creatinine, Ser: 2.23 mg/dL — ABNORMAL HIGH (ref 0.44–1.00)
GFR, Estimated: 23 mL/min — ABNORMAL LOW (ref >60)
Glucose, Bld: 103 mg/dL — ABNORMAL HIGH (ref 70–99)
Potassium: 3.6 mmol/L (ref 3.5–5.1)
Sodium: 138 mmol/L (ref 135–145)

## 2021-06-18 LAB — CBC WITH DIFFERENTIAL/PLATELET
Abs Immature Granulocytes: 0.22 10*3/uL — ABNORMAL HIGH (ref 0.00–0.07)
Basophils Absolute: 0 10*3/uL (ref 0.0–0.1)
Basophils Relative: 0 %
Eosinophils Absolute: 0.7 10*3/uL — ABNORMAL HIGH (ref 0.0–0.5)
Eosinophils Relative: 7 %
HCT: 31 % — ABNORMAL LOW (ref 36.0–46.0)
Hemoglobin: 9.8 g/dL — ABNORMAL LOW (ref 12.0–15.0)
Immature Granulocytes: 2 %
Lymphocytes Relative: 20 %
Lymphs Abs: 2 10*3/uL (ref 0.7–4.0)
MCH: 27.8 pg (ref 26.0–34.0)
MCHC: 31.6 g/dL (ref 30.0–36.0)
MCV: 87.8 fL (ref 80.0–100.0)
Monocytes Absolute: 0.7 10*3/uL (ref 0.1–1.0)
Monocytes Relative: 7 %
Neutro Abs: 6.1 10*3/uL (ref 1.7–7.7)
Neutrophils Relative %: 64 %
Platelets: 354 10*3/uL (ref 150–400)
RBC: 3.53 MIL/uL — ABNORMAL LOW (ref 3.87–5.11)
RDW: 15.1 % (ref 11.5–15.5)
WBC: 9.7 10*3/uL (ref 4.0–10.5)
nRBC: 0 % (ref 0.0–0.2)

## 2021-06-18 LAB — GLUCOSE, CAPILLARY
Glucose-Capillary: 115 mg/dL — ABNORMAL HIGH (ref 70–99)
Glucose-Capillary: 87 mg/dL (ref 70–99)
Glucose-Capillary: 94 mg/dL (ref 70–99)
Glucose-Capillary: 104 mg/dL — ABNORMAL HIGH (ref 70–99)

## 2021-06-18 LAB — URINE CULTURE: Culture: >=100000 — AB

## 2021-06-18 NOTE — Progress Notes (Signed)
Occupational Therapy Session Note  Patient Details  Name: Becky Hobbs MRN: [id number] Date of Birth: Sep 30, 1946  Today's Date: 06/18/2021 OT Individual Time: 9747-1855 OT Individual Time Calculation (min): 74 min    Short Term Goals: Week 1:  OT Short Term Goal 1 (Week 1): Pt will be able to manage clothing over hips with S pre toileting and slight A post toileting. OT Short Term Goal 1 - Progress (Week 1): Progressing toward goal OT Short Term Goal 2 (Week 1): Pt will sit to stand from [facility] or toilet with S. OT Short Term Goal 2 - Progress (Week 1): Met OT Short Term Goal 3 (Week 1): Pt will don shirt with set up. OT Short Term Goal 3 - Progress (Week 1): Progressing toward goal OT Short Term Goal 4 (Week 1): Pt will have improved sh ROM to reach across chest to wash under her arms more easily. OT Short Term Goal 4 - Progress (Week 1): Progressing toward goal Week 2:  OT Short Term Goal 1 (Week 2): STGs = LTGs d/t ELOS   Skilled Therapeutic Interventions/Progress Updates:    Pt greeted at time of session semireclined in bed resting finishing eating breakfast, agreeable to OT session, no pain reported. Wanting to take a shower, RN disconnecting IV at beginning of session. OT covering IV site and covering neck with tegaderm for shower. Supine > sit Supervision and ambulated to bathroom light CGA/close supervision and doffed clothing for toileting Supervision for standing balance. MD entered at this time making rounds. After visit, pt walked toilet > shower no AD with CGA and performed UB/LB bathing with CGA overall and using LHS for feet/back. Assist with drying off feet and donning socks prior to mobility. Walked tub bench > bed with CGA and RW, sitting EOB for UB dress Min/Mod to get over head and fully pull down in the back, Mod A for LB dressing as well as pt used reacher to start threading but unable to fully get over gripper socks. Sit > stand to don over hips. Pt very fatigued and  stating she needed more help today because she was tired from the shower. Sit > supine to rest before next session with alarm on call bell in reach.   Therapy Documentation Precautions:  Precautions Precautions: Fall, Cervical Precaution Comments: needs reminder for precautions 8/29; no brace needed Restrictions Weight Bearing Restrictions: No     Therapy/Group: Individual Therapy  Viona Gilmore 06/18/2021, 7:11 AM

## 2021-06-18 NOTE — Progress Notes (Signed)
Physical Therapy Session Note  Patient Details  Name: Becky Hobbs MRN: [id number] Date of Birth: [birth year]  Today's Date: 06/18/2021 PT Individual Time: 0930-1030 PT Individual Time Calculation (min): 60 min   Short Term Goals: Week 2:  PT Short Term Goal 1 (Week 2): =LTG due to ELOS  Skilled Therapeutic Interventions/Progress Updates:    Pt received supine in bed, agreeable to PT session. Pt reports having neck pain and shoulder pain this date, nursing unavailable to provide pain medication during session. Utilized repositioning during session for pain management as well as pt wore her hard cervical collar for comfort. Bed mobility Supervision. Sit to stand with close Supervision to CGA with RW during session. Ambulation 200 ft with RW with close Supervision to [place]. Ascend/descend 12 x 6" stairs with R handrail and CGA to simulate home environment. Per pt report she utilizes a stool to get into her bed at home due to bed height. Pt able to back up to bed with RW to step up onto with 6" stool, attempt to ascend with RLE first but it buckles, pt able to recover balance with mod A. Pt able to step up with LLE first initially with mod A progressing to min A. Pt will benefit from continued practice using step stool to get into bed with RW. Pt returned to bed at end of session, Supervision for bed mobility. Pt left seated in bed with needs in reach, bed alarm in place. Pt more hyperverbal this session and requires cues to be redirected to focus on therapy tasks.  Therapy Documentation Precautions:  Precautions Precautions: Fall, Cervical Precaution Comments: needs reminder for precautions 8/29; no brace needed Restrictions Weight Bearing Restrictions: No    Therapy/Group: Individual Therapy   Excell Seltzer, PT, DPT, CSRS  06/18/2021, 12:20 PM

## 2021-06-18 NOTE — Progress Notes (Signed)
Physical Therapy Session Note  Patient Details  Name: [name] MRN: [id number] Date of Birth: [birth year]  Today's Date: 06/18/2021 PT Individual Time: 1350-1445 PT Individual Time Calculation (min): 55 min   Short Term Goals: Week 2:  PT Short Term Goal 1 (Week 2): =LTG due to ELOS  Skilled Therapeutic Interventions/Progress Updates: Pt presented in bed with son Grayland Ormond present agreeable to therapy. Pt states recently received pain meds and minimal pain (did not rate) but agreeable to wear cervical collar for comfort. Pt requesting son to come to session thus session focused on functional mobility and having son observe current functional level if any concerns should arise. Pt performed supine to sit with supervision and use of bed features. Ambulated with rollator to ADL apt and reviewed furniture transfers and using footstool to step up into bed. Pt was able to step backwards with RW and CGA and no instances of knee buckling. Pt then ambulated to ortho gym and performed car transfer using step to elevated surface to simulate runner as will be leaving in son's truck. Pt required min cues for sequencing due to initially attempting to perform in unsafe manner, however was ultimately able to perform with CGA. Pt ambulated back to room via rollator and supervision and transferred back to bed supervision. Pt left in bed at end of session with bed alarm on, call bell within reach and needs met.      Therapy Documentation Precautions:  Precautions Precautions: Fall, Cervical Precaution Comments: needs reminder for precautions 8/29; no brace needed Restrictions Weight Bearing Restrictions: No General:   Vital Signs:  Pain: Pain Assessment Pain Score: 0-No pain   Therapy/Group: Individual Therapy  Felisia Balcom Wendelin Bradt, PTA  06/18/2021, 4:01 PM

## 2021-06-19 LAB — GLUCOSE, CAPILLARY
Glucose-Capillary: 97 mg/dL (ref 70–99)
Glucose-Capillary: 113 mg/dL — ABNORMAL HIGH (ref 70–99)
Glucose-Capillary: 73 mg/dL (ref 70–99)
Glucose-Capillary: 104 mg/dL — ABNORMAL HIGH (ref 70–99)

## 2021-06-19 MED ORDER — POTASSIUM CHLORIDE CRYS ER 20 MEQ PO TBCR
40.0000 meq | EXTENDED_RELEASE_TABLET | Freq: Every day | ORAL | Status: DC
  Administered 2021-06-20 – 2021-06-23 (×4): 40 meq via ORAL
  Filled 2021-06-19 (×4): qty 2

## 2021-06-19 MED ORDER — LEVOFLOXACIN 250 MG PO TABS
250.0000 mg | ORAL_TABLET | Freq: Every day | ORAL | Status: DC
  Administered 2021-06-19 – 2021-06-24 (×6): 250 mg via ORAL
  Filled 2021-06-19 (×6): qty 1

## 2021-06-19 MED ORDER — CIPROFLOXACIN HCL 250 MG PO TABS
500.0000 mg | ORAL_TABLET | Freq: Every day | ORAL | Status: DC
  Filled 2021-06-19: qty 2

## 2021-06-19 NOTE — Progress Notes (Signed)
Physical Therapy Session Note  Patient Details  Name: Becky Hobbs MRN: [id number] Date of Birth: Nov 07, 1945  Today's Date: 06/19/2021 PT Individual Time: 1122-1200 PT Individual Time Calculation (min): 38 min   Short Term Goals: Week 1:  PT Short Term Goal 1 (Week 1): Pt will perform least restrictive transfer with min A consistently PT Short Term Goal 1 - Progress (Week 1): Met PT Short Term Goal 2 (Week 1): Pt will initiate gait training as safe and able PT Short Term Goal 2 - Progress (Week 1): Met PT Short Term Goal 3 (Week 1): Pt will tolerate sitting OOB x 1 hour PT Short Term Goal 3 - Progress (Week 1): Met PT Short Term Goal 4 (Week 1): Pt will initiate stair training as safe and able PT Short Term Goal 4 - Progress (Week 1): Met  Skilled Therapeutic Interventions/Progress Updates:  Pt received sitting in WC in room, cervical collar on, receiving pain meds from nursing. Pt reported 4/10 pain in B shoulders and was agreeable to PT. Pt very verbose throughout session. Sit <>stand from [facility] and pt ambulated 150' w/RW and supervision, verbal cues to not pick up walker while using it. Alt. Toe taps to 6" box without UE support for improved single leg stability and lateral weight shift, x20 per side w/min A for steadying assist. Progressed to colored cone taps from standing on 6" box for improved eccentric quad control x2 minutes w/HHA on RUE and min A for retropulsion correction. Pt ambulated 150' back to room w/RW and CGA and was left sitting in WC in room w/all needs in reach. Reported pain as 2/10 in B shoulders.   Therapy Documentation Precautions:  Precautions Precautions: Fall, Cervical Precaution Comments: needs reminder for precautions 8/29; no brace needed Restrictions Weight Bearing Restrictions: No  Therapy/Group: Individual Therapy Cruzita Lederer Shakyla Nolley, PT, DPT  06/19/2021, 7:55 AM

## 2021-06-19 NOTE — Progress Notes (Signed)
PROGRESS NOTE   Subjective/Complaints:  Pt reports feeling better-  denies any issues-  Spoke with pharmacy about changing her to Levaquin to avoid Cipro interactions with Zanaflex since working so well- also has Pseudomonas in Urine, so will give 7 days total.     ROS:  Pt denies SOB, abd pain, CP, N/V/C/D, and vision changes   Objective:   No results found. Recent Labs    06/17/21 0518 06/18/21 0515  WBC 12.6* 9.7  HGB 9.1* 9.8*  HCT 28.5* 31.0*  PLT 326 354    Recent Labs    06/17/21 0518 06/18/21 0515  NA 134* 138  K 3.9 3.6  CL 98 103  CO2 26 27  GLUCOSE 141* 103*  BUN 74* 54*  CREATININE 2.66* 2.23*  CALCIUM 9.1 9.5     Intake/Output Summary (Last 24 hours) at 06/19/2021 1023 Last data filed at 06/19/2021 0800 Gross per 24 hour  Intake 600 ml  Output --  Net 600 ml        Physical Exam: Vital Signs Blood pressure (!) 136/53, pulse 83, temperature 97.7 F (36.5 C), temperature source Oral, resp. rate 18, height 5\' 4"  (1.626 m), weight 103.4 kg, SpO2 100 %.     General: awake, alert, appropriate, standing/walking with therapy in gym; doing stairs;  NAD HENT: conjugate gaze; oropharynx moist CV: regular rate; no JVD Pulmonary: CTA B/L; no W/R/R- good air movement GI: soft, NT, ND, (+)BS Psychiatric: appropriate; interactive; asking questions Neurological: Ox3 Much more alert- head up- not sleeping/groggy  MS: very tight muscles in scalenes and splenius capitus B/L , however very loose in levators and upper traps and supraspinatus B/L-lidocaine patches in place Skin: incision looks sticky from previous dressing, but healing well- no erythema or drainage   Assessment/Plan: 1. Functional deficits which require 3+ hours per day of interdisciplinary therapy in a comprehensive inpatient rehab setting. Physiatrist is providing close team supervision and 24 hour management of active medical  problems listed below. Physiatrist and rehab team continue to assess barriers to discharge/monitor patient progress toward functional and medical goals  Care Tool:  Bathing    Body parts bathed by patient: Chest, Abdomen, Front perineal area, Left arm, Right arm, Left upper leg, Right upper leg, Face, Right lower leg, Left lower leg, Buttocks   Body parts bathed by helper: Buttocks     Bathing assist Assist Level: Contact Guard/Touching assist     Upper Body Dressing/Undressing Upper body dressing   What is the patient wearing?: Pull over shirt    Upper body assist Assist Level: Minimal Assistance - Patient > 75%    Lower Body Dressing/Undressing Lower body dressing      What is the patient wearing?: Pants, Incontinence brief     Lower body assist Assist for lower body dressing: Moderate Assistance - Patient 50 - 74%     Toileting Toileting    Toileting assist Assist for toileting: Minimal Assistance - Patient > 75%     Transfers Chair/bed transfer  Transfers assist  Chair/bed transfer activity did not occur: Refused  Chair/bed transfer assist level: Contact Guard/Touching assist     Locomotion Ambulation   Ambulation assist  Ambulation activity did not occur: Safety/medical concerns  Assist level: Contact Guard/Touching assist Assistive device: Walker-rolling Max distance: 200'   Walk 10 feet activity   Assist  Walk 10 feet activity did not occur: Safety/medical concerns  Assist level: Contact Guard/Touching assist Assistive device: Walker-rolling   Walk 50 feet activity   Assist Walk 50 feet with 2 turns activity did not occur: Safety/medical concerns  Assist level: Contact Guard/Touching assist Assistive device: Walker-rolling    Walk 150 feet activity   Assist Walk 150 feet activity did not occur: Safety/medical concerns  Assist level: Contact Guard/Touching assist Assistive device: Walker-rolling    Walk 10 feet on uneven  surface  activity   Assist Walk 10 feet on uneven surfaces activity did not occur: Safety/medical concerns         Wheelchair     Assist Is the patient using a wheelchair?: No (TBD)             Wheelchair 50 feet with 2 turns activity    Assist            Wheelchair 150 feet activity     Assist          Blood pressure (!) 136/53, pulse 83, temperature 97.7 F (36.5 C), temperature source Oral, resp. rate 18, height 5\' 4"  (1.626 m), weight 103.4 kg, SpO2 100 %.  Medical Problem List and Plan: 1.  Anterior and posterior C1 ring fracture s/p occiput to C5 fusion             -patient may shower but incision must be covered             -ELOS/Goals: 10-14 days S             -Admit to CIR  -first day of evaluations- will do ASIA exam Thursday  -con't PT and OT/CIR_ determine exact d/c date at team conference  9/8- d/c date 9/13- con't PT and OT 2.  Antithrombotics: -DVT/anticoagulation:  Mechanical: Sequential compression devices, below knee Bilateral lower extremities 9/1- has been 7 days since surgery- ill start Lovenox- GFR is 22- so will start SQ heparin for now and check with pharmacy.              -antiplatelet therapy: N/A 3. Pain: continue Oxycodone prn-->decrease to 5 mg prn.              --On Gabapentin 300 mg bid  8/30- pt reports a HA this AM, but reports Oxy helps- doesn't want higher dose- ?makes her hallucinate". Will con't current dosing  9/1- pt reports pain horrific- appears like muscle spasms of neck- will d/c flexeril and add Zanaflex 2 mg TID- con't opiate dosing. 9/5- main pain is HA and neck pain- neck pain controlled- con't regimen 9/8- HA's about the same, but feeling better overall- con't topamax started for HA prevention 4. Mood: LCSW to follow for evaluation and support.              -antipsychotic agents: N/a 5. Neuropsych: This patient is not fully capable of making decisions on her own behalf. 6. Skin/Wound Care: Monitor  wound for healing.  7. Fluids/Electrolytes/Nutrition: Monitor I/O. Check lytes in am.  8. HTN: Monitor BID --on demadex, metoprolol,   9/2- BP controlle-d con't regimen 9. CKD: Question baseline --SCr-3.06 at admission.   8/30- pt's Cr up from 2.08 to 2.24- will encourage fluids and recheck in AM- wait on d/c'ing IV since Cr is up  8/31- Cr 1.99- and  BUN 42- down from 2.24- will wait on IVFs since at baseline today  9/1- recheck on Monday   9/5- Cr 2.95- start IVFs 75 cc/hour- x 1 day and recheck again today and tomorrow- to make sure is hers. BUN 84 and Cr 2.95  9/6- Cr maxxed out at 3.12- likely UTI and dehydration- Cr down to 2.66- will con't NS IVFs x 1 more day and recheck in AM  9/8- Cr back to baseline of 2.3- will recheck Monday 10.  Hypokalemia: K+ has been progressing down--will recheck today and supplement  8/30- K+ 3.4 this AM- will replete and recheck in AM  8/31- K+ 4.0- much improved  9/5- will recheck, but K+ 2.8- will recheck again- and order KCL 40 mEq x3.   9/6- K= 3.9- doing much better  9/8- K+ 3.6- is heading downwards again- will increase KCL to 40 mEq daily and recheck Monday.  11. H/o panic attacks/anxiety d/o: Decrease Xanax to HS, continue Vistaril tid prn 12.  H/o asthma: Continue Breo.  13. T2DM: Hgb A1C-6.2. Well controlled on Tulicity every Sun-->-will have pt ask son to bring it from home.  --Monitor BS ac/hs. Carb modified diet.  6/28- out of Trulicity- will need son to bring in- BG's doing well- con't regimen 9/1- did get trulicity this week- BG's look great- con't regimen  9/8- BG's look great- con't regimen 14. 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 Follows with Dr. [facility]. Check ammonia level given confusion 15. Confusion s/p falls: CT head ordered.    8/30- CT was OK, except "aging brain" and small R parietal scalp hematoma- will monitor  9/4: mental status much improved, continue to monitor 16. Neurogenic  bowel and bladder  8/31- is requiring cathing sometimes and has bowel incontinence with sorbitol- if continues, will place bowel program- will see if need to start Flomax?  9/1- No incontinence in last 24 hours of urine- and bladder scans <180cc- wait on any meds. Bowel incontinence could have been due to Sorbitol?  9/2- pt reports still having BM's from the sorbitol- explained she was full of stool- and should improve.   9/4: diarrhea improved- discussed high fiber diet to help avoid future constipation.   9/8- having regular BM's  con't regimen 17. Hypotension: diastolic down to 42: decrease Toprol to 175mg .  18. Leukocytosis/UTI  9/5- WBC 15.5- labs are so off and pt looks OK, so will recheck- now and AM- and ordered U/A and Cx- lungs sounds clear, but will check at U/A first and go from there.   9/6 - WBC 12.6- but also diluted- will start Keflex 250 mg q8 hours per Pharmacy because no documentation of issues with cephalosporins.   9/8- Switch to Levaquin since is (+) for Pseudomonas    LOS: 10 days A FACE TO FACE EVALUATION WAS PERFORMED  Sakinah Rosamond 06/19/2021, 10:23 AM

## 2021-06-19 NOTE — Progress Notes (Signed)
Physical Therapy Session Note  Patient Details  Name: Becky Hobbs MRN: [id number] Date of Birth: Feb 27, 1946  Today's Date: 06/19/2021 PT Individual Time:  -      Short Term Goals: Week 2:  PT Short Term Goal 1 (Week 2): =LTG due to ELOS  Skilled Therapeutic Interventions/Progress Updates: Pt presents sitting in w/c finishing lunch and agreeable to therapy.  Pt required total A to don shoes.  Pt wheeled to outside.  Pt amb multiple trials on uneven surfaces w/ CGA and rollator.  Pt requires verbal cues for posture and rollator management especially on ramped surfaces.  Pt transfers from multiple surfaces, including w/c and outside bench w/ CGA.  Pt performed standing marching, sidestepping, backwards w/ HHA and cueing for speed and safety w/ silent weight acceptance for improved control.  Pt amb into room and sat EOB.  Pt scooted to foot of bed and performed step-pivot to w/c w/o AD and CGA.  Chair alarm on and all needs in reach.     Therapy Documentation Precautions:  Precautions Precautions: Fall, Cervical Precaution Comments: needs reminder for precautions 8/29; no brace needed Restrictions Weight Bearing Restrictions: No General:   Vital Signs:  Pain:0/10       Therapy/Group: Individual Therapy  Ladoris Gene 06/19/2021, 2:12 PM

## 2021-06-19 NOTE — Progress Notes (Signed)
Occupational Therapy Session Note  Patient Details  Name: Becky Hobbs MRN: [id number] Date of Birth: 1946/06/20  Today's Date: 06/19/2021 OT Individual Time: 1030-1100 OT Individual Time Calculation (min): 30 min   Short Term Goals: Week 2:  OT Short Term Goal 1 (Week 2): STGs = LTGs d/t ELOS  Skilled Therapeutic Interventions/Progress Updates:    Pt greeted semi-reclined in bed and agreebale to OT treatment session. Pt reports pain in neck but just received pain meds. Pt donned c-collar independently. Pt then came to sitting EOB with supervision and completed stand-pivot over to wc. Pt wanted to order new head bands, so worked on standing balance/endurance while using phone to go on [address] and purchase head bands. Pt with 1 lateral LOB requiring min A to correct. Pt ambulated to the bathroom with CGA and voided bladder, supervision for toileting tasks. Pt then performed 10 squats for LB strengthening. Pt left seated in wc with alarm belt on, call bell in reach, and needs met.   Therapy Documentation Precautions:  Precautions Precautions: Fall, Cervical Precaution Comments: needs reminder for precautions 8/29; no brace needed Restrictions Weight Bearing Restrictions: No  Pain: 3/10 to cervical spine, rest and repositioned for comfort   Therapy/Group: Individual Therapy  Valma Cava 06/19/2021, 11:03 AM

## 2021-06-19 NOTE — Progress Notes (Signed)
Physical Therapy Session Note  Patient Details  Name: Becky Hobbs MRN: [id number] Date of Birth: Feb 29, 1946  Today's Date: 06/19/2021 PT Individual Time: 0805-0900 PT Individual Time Calculation (min): 55 min   Short Term Goals: Week 2:  PT Short Term Goal 1 (Week 2): =LTG due to ELOS  Skilled Therapeutic Interventions/Progress Updates: Pt presented in bed completing breakfast with nsg present to administer am meds agreeable to therapy. Pt states some soreness in neck but did not rate. Pt requesting to use bathroom, performed bed mobility supervision with use of bed features. Performed ambulatory transfer without AD but noted pt reaching for wall frame when entering bathroom. Performed LB clothing management with supervision and performed toilet transfers with supervision (+urinary void). Pt then ambulated to sink CGA without AD and performed oral hygiene in standing as well as washed face. Per pt request donned cervical brace for comfort with pt stating feels more comfortable prior to meds taking effect. Pt then ambulated with RW and CGA to rehab gym and participated in dynamic balance activity of use of rebounder forward and with L/R trunk rotation x 25 each direction. Pt also participated in stair training x 4 with R rail only CGA with step through pattern. After seated rest, pt participated in gait training without AD with CGA. Pt noted to have increased lateral sway and indicated aware that she's more "unstable" without RW. Pt then ambulated back to room with RW at end of session and left seated EOB with bed alarm on, call bell within reach and needs met.      Therapy Documentation Precautions:  Precautions Precautions: Fall, Cervical Precaution Comments: needs reminder for precautions 8/29; no brace needed Restrictions Weight Bearing Restrictions: No General:   Vital Signs: Therapy Vitals Temp: 97.7 F (36.5 C) Temp Source: Oral Pulse Rate: 81 Resp: 18 BP: (!) 120/54 Patient  Position (if appropriate): Sitting Oxygen Therapy SpO2: 100 % O2 Device: Room Air   Therapy/Group: Individual Therapy  Jaymz Traywick 06/19/2021, 4:34 PM

## 2021-06-20 LAB — GLUCOSE, CAPILLARY
Glucose-Capillary: 91 mg/dL (ref 70–99)
Glucose-Capillary: 109 mg/dL — ABNORMAL HIGH (ref 70–99)
Glucose-Capillary: 123 mg/dL — ABNORMAL HIGH (ref 70–99)
Glucose-Capillary: 102 mg/dL — ABNORMAL HIGH (ref 70–99)

## 2021-06-20 MED ORDER — PHENOL 1.4 % MT LIQD
1.0000 | OROMUCOSAL | Status: DC | PRN
  Filled 2021-06-20: qty 177

## 2021-06-20 NOTE — Progress Notes (Signed)
Physical Therapy Session Note  Patient Details  Name: Becky Hobbs MRN: [id number] Date of Birth: 01-Jul-1946  Today's Date: 06/20/2021 PT Individual Time: 1400-1430 PT Individual Time Calculation (min): 30 min   Short Term Goals: Week 1:  PT Short Term Goal 1 (Week 1): Pt will perform least restrictive transfer with min A consistently PT Short Term Goal 1 - Progress (Week 1): Met PT Short Term Goal 2 (Week 1): Pt will initiate gait training as safe and able PT Short Term Goal 2 - Progress (Week 1): Met PT Short Term Goal 3 (Week 1): Pt will tolerate sitting OOB x 1 hour PT Short Term Goal 3 - Progress (Week 1): Met PT Short Term Goal 4 (Week 1): Pt will initiate stair training as safe and able PT Short Term Goal 4 - Progress (Week 1): Met Week 2:  PT Short Term Goal 1 (Week 2): =LTG due to ELOS  Skilled Therapeutic Interventions/Progress Updates:    Pt received in recliner and agreeable to therapy.  No complaint of pain. Donned c-collar with min A, pt had some difficulty getting brace under her chin. Pt stood and walked to rollator with CGA. Gait x 150 ft to therapy gym with rollator and CGA for improved functional mobility. Pt directed in balance training with cone taps, 3 x 20. Pt became fatigued at the end of each set, but demoed improved performance on last set. Pt then ambulated x 200 ft with rollator and CGA to return to room for increased endurance. Pt returned to recliner in same manner as above and was left with all needs in reach and alarm active.   Therapy Documentation Precautions:  Precautions Precautions: Fall, Cervical Precaution Comments: needs reminder for precautions 8/29; no brace needed Restrictions Weight Bearing Restrictions: No     Therapy/Group: Individual Therapy  Mickel Fuchs 06/20/2021, 7:42 PM

## 2021-06-20 NOTE — Progress Notes (Signed)
Occupational Therapy Session Note  Patient Details  Name: Becky Hobbs MRN: [id number] Date of Birth: 05/06/1946  Today's Date: 06/20/2021  OT Individual Time Calculation (min): 60 min [id number]    Short Term Goals: Week 1:  OT Short Term Goal 1 (Week 1): Pt will be able to manage clothing over hips with S pre toileting and slight A post toileting. OT Short Term Goal 1 - Progress (Week 1): Progressing toward goal OT Short Term Goal 2 (Week 1): Pt will sit to stand from [facility] or toilet with S. OT Short Term Goal 2 - Progress (Week 1): Met OT Short Term Goal 3 (Week 1): Pt will don shirt with set up. OT Short Term Goal 3 - Progress (Week 1): Progressing toward goal OT Short Term Goal 4 (Week 1): Pt will have improved sh ROM to reach across chest to wash under her arms more easily. OT Short Term Goal 4 - Progress (Week 1): Progressing toward goal Week 2:  OT Short Term Goal 1 (Week 2): STGs = LTGs d/t ELOS  Skilled Therapeutic Interventions/Progress Updates:    Pt received sitting in recliner, agreeable to OT and no c/o pain. Pt reported she was up and walking around room last night unsupervised to organize her belongings and took a shower. Stated she was too tired to put on clothing and had to call for help because she felt like she was "going to pass out". Pt verbalized precautions several times. Declined offer to shower, but said she had to urinate. Demonstrated impulsivity and poor safety awareness/judgement by trying to get out of recliner without AD and stated she just would hold on to the furniture to get to the bathroom. Able to don cervical collar independently. Pt used rollator to walk to bathroom with CGA, and was able to complete toileting with CGA. Required cues for safety to pull pants distally over ankles before standing up from toilet. Used rollator to complete functional mobility to gym. Stand > sit on mat with supervision. Education provided to reach back for mat and to lock breaks  prior to sitting. Pt completes BUE therex for global strengthening with 3# dumbbells for biceps/wrists/int/ext rotation. Pt educated on adaptive strategies for use of reacher v dressing stick to help extend reach but maintain cervical precautions when hanging clothing up on hangers. Pt reports liking AE.  Returned to room with rollator and CGA. Exited session with pt seated in bed, exit alarm on and call light in reach   Therapy Documentation Precautions:  Precautions Precautions: Fall, Cervical Precaution Comments: needs reminder for precautions 8/29; no brace needed Restrictions Weight Bearing Restrictions: No Therapy/Group: Individual Therapy  Jenn Worischeck 06/20/2021, 12:44 PM

## 2021-06-20 NOTE — Progress Notes (Signed)
Physical Therapy Session Note  Patient Details  Name: Becky Hobbs MRN: [id number] Date of Birth: 01/27/1946  Today's Date: 06/20/2021 PT Individual Time: 0810-0825 PT Individual Time Calculation (min): 15 min   Short Term Goals: Week 2:  PT Short Term Goal 1 (Week 2): =LTG due to ELOS  Skilled Therapeutic Interventions/Progress Updates: Pt presented in bed sleeping but easily aroused. Pt noted to not have eaten breakfast. Pt also stating increased ms soreness however am meds not administered as of yet. Pt performed bed mobility with supervision and use of bed featutres and performed stand pivot transfer to recliner close S. Pt was able to eat some food. PTA returned ~15 later with pt not completed breakfast. Pt requesting to complete breakfast. Pt missed 45 min skilled PT.      Therapy Documentation Precautions:  Precautions Precautions: Fall, Cervical Precaution Comments: needs reminder for precautions 8/29; no brace needed Restrictions Weight Bearing Restrictions: No General: PT Amount of Missed Time (min): 45 Minutes PT Missed Treatment Reason: Other (Comment) Vital Signs: Therapy Vitals Temp: 98.4 F (36.9 C) Pulse Rate: 96 Resp: 16 BP: 132/65 Patient Position (if appropriate): Sitting Oxygen Therapy SpO2: 100 % O2 Device: Room Air    Therapy/Group: Individual Therapy  Shelby Peltz 06/20/2021, 12:48 PM

## 2021-06-20 NOTE — Progress Notes (Signed)
Physical Therapy Session Note  Patient Details  Name: Becky Hobbs MRN: [id number] Date of Birth: October 11, 1946  Today's Date: 06/20/2021 PT Individual Time: 1520-1555 PT Individual Time Calculation (min): 35 min   Short Term Goals: Week 1:  PT Short Term Goal 1 (Week 1): Pt will perform least restrictive transfer with min A consistently PT Short Term Goal 1 - Progress (Week 1): Met PT Short Term Goal 2 (Week 1): Pt will initiate gait training as safe and able PT Short Term Goal 2 - Progress (Week 1): Met PT Short Term Goal 3 (Week 1): Pt will tolerate sitting OOB x 1 hour PT Short Term Goal 3 - Progress (Week 1): Met PT Short Term Goal 4 (Week 1): Pt will initiate stair training as safe and able PT Short Term Goal 4 - Progress (Week 1): Met Week 2:  PT Short Term Goal 1 (Week 2): =LTG due to ELOS Week 3:     Skilled Therapeutic Interventions/Progress Updates:   Pt initially oob in recliner, agreeable to session w/encouragment/explanation regarding my name not appearing on her written schdule due to late change.  Sit to stand from recliner to rollator w/supervision.  Gait trials 150-256f including backing, turns, tight spaces, approaching stairs, and ascending/descending ramp mult times  (distance x 4) during session w/supervision.  Did require cues to attend to locking brakes on rollator w/transitions from armchairs. Stairs - ascended/descended 4 stairs w/single rail via lateral approach w/cga. At end of session, pt returned to recliner w/supervision.  Family in room to visit.  Pt left oob in recliner w/chair alarm set and needs in reach.  Therapy Documentation Precautions:  Precautions Precautions: Fall, Cervical Precaution Comments: needs reminder for precautions 8/29; no brace needed Restrictions Weight Bearing Restrictions: No     Therapy/Group: Individual Therapy BCallie Fielding PWest [place]9/06/2021, 4:15 PM

## 2021-06-20 NOTE — Progress Notes (Signed)
PROGRESS NOTE   Subjective/Complaints:  Pt reports throat is sore- Also had "laryngitis" earlier in week-  Clearing throat some as well- not an actual cough per pt.  Tired form exercise yesterday.     ROS:  Pt denies SOB, abd pain, CP, N/V/C/D, and vision changes   Objective:   No results found. Recent Labs    06/18/21 0515  WBC 9.7  HGB 9.8*  HCT 31.0*  PLT 354    Recent Labs    06/18/21 0515  NA 138  K 3.6  CL 103  CO2 27  GLUCOSE 103*  BUN 54*  CREATININE 2.23*  CALCIUM 9.5     Intake/Output Summary (Last 24 hours) at 06/20/2021 0842 Last data filed at 06/20/2021 0700 Gross per 24 hour  Intake 620 ml  Output --  Net 620 ml        Physical Exam: Vital Signs Blood pressure (!) 124/52, pulse 89, temperature 97.7 F (36.5 C), resp. rate 18, height 5\' 4"  (1.626 m), weight 103.4 kg, SpO2 99 %.      General: awake, alert, appropriate, but a little groggy when woke her up- towel around her neck; collar off; NAD HENT: conjugate gaze; oropharynx moist CV: regular rate; no JVD Pulmonary: CTA B/L; no W/R/R- good air movement GI: soft, NT, ND, (+)BS Psychiatric: appropriate but groggy- just woke up Neurological: alert  MS: very tight muscles in scalenes and splenius capitus B/L , however very loose in levators and upper traps and supraspinatus B/L-lidocaine patches in place Skin: incision looks sticky from previous dressing, but healing well- no erythema or drainage   Assessment/Plan: 1. Functional deficits which require 3+ hours per day of interdisciplinary therapy in a comprehensive inpatient rehab setting. Physiatrist is providing close team supervision and 24 hour management of active medical problems listed below. Physiatrist and rehab team continue to assess barriers to discharge/monitor patient progress toward functional and medical goals  Care Tool:  Bathing    Body parts bathed by  patient: Chest, Abdomen, Front perineal area, Left arm, Right arm, Left upper leg, Right upper leg, Face, Right lower leg, Left lower leg, Buttocks   Body parts bathed by helper: Buttocks     Bathing assist Assist Level: Contact Guard/Touching assist     Upper Body Dressing/Undressing Upper body dressing   What is the patient wearing?: Pull over shirt    Upper body assist Assist Level: Minimal Assistance - Patient > 75%    Lower Body Dressing/Undressing Lower body dressing      What is the patient wearing?: Pants, Incontinence brief     Lower body assist Assist for lower body dressing: Moderate Assistance - Patient 50 - 74%     Toileting Toileting    Toileting assist Assist for toileting: Minimal Assistance - Patient > 75%     Transfers Chair/bed transfer  Transfers assist  Chair/bed transfer activity did not occur: Refused  Chair/bed transfer assist level: Supervision/Verbal cueing     Locomotion Ambulation   Ambulation assist   Ambulation activity did not occur: Safety/medical concerns  Assist level: Supervision/Verbal cueing Assistive device: Walker-rolling Max distance: 150   Walk 10 feet activity   Assist  Walk 10 feet activity did not occur: Safety/medical concerns  Assist level: Supervision/Verbal cueing Assistive device: Walker-rolling   Walk 50 feet activity   Assist Walk 50 feet with 2 turns activity did not occur: Safety/medical concerns  Assist level: Supervision/Verbal cueing Assistive device: Walker-rolling    Walk 150 feet activity   Assist Walk 150 feet activity did not occur: Safety/medical concerns  Assist level: Supervision/Verbal cueing Assistive device: Walker-rolling    Walk 10 feet on uneven surface  activity   Assist Walk 10 feet on uneven surfaces activity did not occur: Safety/medical concerns   Assist level: Contact Guard/Touching assist Assistive device: Rollator   Wheelchair     Assist Is the  patient using a wheelchair?: No (TBD)             Wheelchair 50 feet with 2 turns activity    Assist            Wheelchair 150 feet activity     Assist          Blood pressure (!) 124/52, pulse 89, temperature 97.7 F (36.5 C), resp. rate 18, height 5\' 4"  (1.626 m), weight 103.4 kg, SpO2 99 %.  Medical Problem List and Plan: 1.  Anterior and posterior C1 ring fracture s/p occiput to C5 fusion             -patient may shower but incision must be covered             -ELOS/Goals: 10-14 days S             -Admit to CIR  -first day of evaluations- will do ASIA exam Thursday  -con't PT and OT/CIR_ determine exact d/c date at team conference  D/c date 9/13 -con't PT and OT_ CIR 2.  Antithrombotics: -DVT/anticoagulation:  Mechanical: Sequential compression devices, below knee Bilateral lower extremities 9/1- has been 7 days since surgery- ill start Lovenox- GFR is 22- so will start SQ heparin for now and check with pharmacy.              -antiplatelet therapy: N/A 3. Pain: continue Oxycodone prn-->decrease to 5 mg prn.              --On Gabapentin 300 mg bid  8/30- pt reports a HA this AM, but reports Oxy helps- doesn't want higher dose- ?makes her hallucinate". Will con't current dosing  9/1- pt reports pain horrific- appears like muscle spasms of neck- will d/c flexeril and add Zanaflex 2 mg TID- con't opiate dosing. 9/5- main pain is HA and neck pain- neck pain controlled- con't regimen 9/8- HA's about the same, but feeling better overall- con't topamax started for HA prevention  9/9- no HA this AM- might increase Topamax Monday if needed 4. Mood: LCSW to follow for evaluation and support.              -antipsychotic agents: N/a 5. Neuropsych: This patient is not fully capable of making decisions on her own behalf. 6. Skin/Wound Care: Monitor wound for healing.  7. Fluids/Electrolytes/Nutrition: Monitor I/O. Check lytes in am.  8. HTN: Monitor BID --on demadex,  metoprolol,   9/2- BP controlle-d con't regimen 9. CKD: Question baseline --SCr-3.06 at admission.   8/30- pt's Cr up from 2.08 to 2.24- will encourage fluids and recheck in AM- wait on d/c'ing IV since Cr is up  8/31- Cr 1.99- and BUN 42- down from 2.24- will wait on IVFs since at baseline today  9/1- recheck on Monday  9/5- Cr 2.95- start IVFs 75 cc/hour- x 1 day and recheck again today and tomorrow- to make sure is hers. BUN 84 and Cr 2.95  9/6- Cr maxxed out at 3.12- likely UTI and dehydration- Cr down to 2.66- will con't NS IVFs x 1 more day and recheck in AM  9/8- Cr back to baseline of 2.3- will recheck Monday 10.  Hypokalemia: K+ has been progressing down--will recheck today and supplement  8/30- K+ 3.4 this AM- will replete and recheck in AM  8/31- K+ 4.0- much improved  9/5- will recheck, but K+ 2.8- will recheck again- and order KCL 40 mEq x3.   9/6- K= 3.9- doing much better  9/8- K+ 3.6- is heading downwards again- will increase KCL to 40 mEq daily and recheck Monday.  11. H/o panic attacks/anxiety d/o: Decrease Xanax to HS, continue Vistaril tid prn 12.  H/o asthma: Continue Breo.  13. T2DM: Hgb A1C-6.2. Well controlled on Tulicity every Sun-->-will have pt ask son to bring it from home.  --Monitor BS ac/hs. Carb modified diet.  1/49- out of Trulicity- will need son to bring in- BG's doing well- con't regimen 9/1- did get trulicity this week- BG's look great- con't regimen  9/9- BG's look great- con't regimen 14. 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 Follows with Dr. [facility]. Check ammonia level given confusion 15. Confusion s/p falls: CT head ordered.    8/30- CT was OK, except "aging brain" and small R parietal scalp hematoma- will monitor  9/4: mental status much improved, continue to monitor 16. Neurogenic bowel and bladder  8/31- is requiring cathing sometimes and has bowel incontinence with sorbitol- if continues, will place  bowel program- will see if need to start Flomax?  9/1- No incontinence in last 24 hours of urine- and bladder scans <180cc- wait on any meds. Bowel incontinence could have been due to Sorbitol?  9/2- pt reports still having BM's from the sorbitol- explained she was full of stool- and should improve.   9/4: diarrhea improved- discussed high fiber diet to help avoid future constipation.   9/8- having regular BM's  con't regimen 17. Hypotension: diastolic down to 42: decrease Toprol to 175mg .  18. Leukocytosis/UTI  9/5- WBC 15.5- labs are so off and pt looks OK, so will recheck- now and AM- and ordered U/A and Cx- lungs sounds clear, but will check at U/A first and go from there.   9/6 - WBC 12.6- but also diluted- will start Keflex 250 mg q8 hours per Pharmacy because no documentation of issues with cephalosporins.   9/8- Switch to Levaquin since is (+) for Pseudomonas  9/9- said no Sx's, however is much more awake overall and not confused anymore per staff.     LOS: 11 days A FACE TO FACE EVALUATION WAS PERFORMED  Becky Hobbs 06/20/2021, 8:42 AM

## 2021-06-21 LAB — GLUCOSE, CAPILLARY
Glucose-Capillary: 114 mg/dL — ABNORMAL HIGH (ref 70–99)
Glucose-Capillary: 122 mg/dL — ABNORMAL HIGH (ref 70–99)
Glucose-Capillary: 147 mg/dL — ABNORMAL HIGH (ref 70–99)
Glucose-Capillary: 93 mg/dL (ref 70–99)

## 2021-06-21 NOTE — Progress Notes (Signed)
Occupational Therapy Session Note  Patient Details  Name: Becky Hobbs MRN: [id number] Date of Birth: [birth year]  Today's Date: 06/22/2021 OT Group Time: 1100-1200 OT Group Time Calculation (min): 60 min   Short Term Goals: Week 1:  OT Short Term Goal 1 (Week 1): Pt will be able to manage clothing over hips with S pre toileting and slight A post toileting. OT Short Term Goal 1 - Progress (Week 1): Progressing toward goal OT Short Term Goal 2 (Week 1): Pt will sit to stand from [facility] or toilet with S. OT Short Term Goal 2 - Progress (Week 1): Met OT Short Term Goal 3 (Week 1): Pt will don shirt with set up. OT Short Term Goal 3 - Progress (Week 1): Progressing toward goal OT Short Term Goal 4 (Week 1): Pt will have improved sh ROM to reach across chest to wash under her arms more easily. OT Short Term Goal 4 - Progress (Week 1): Progressing toward goal  Skilled Therapeutic Interventions/Progress Updates:    Pt engaged in therapeutic w/c level dance group focusing on patient choice, UE/LE strengthening, salience, activity tolerance, and social participation. Pt was guided through various dance-based exercises involving UEs/LEs and trunk. All music was selected by group members. Emphasis placed on activity tolerance and general strengthening. Pt participated well during group, was very interactive and requested music. She stood with OT to dance during 1 song with close supervision for balance, at times removing B UE support from her rollator. At end of tx pt was returned to the room by OT.    Therapy Documentation Precautions:  Precautions Precautions: Fall, Cervical Precaution Comments: needs reminder for precautions 8/29; no brace needed Restrictions Weight Bearing Restrictions: No  Pain: some pain in neck, pt wore her neck brace for support during session Pain Assessment Pain Scale: 0-10 Pain Score: 0-No pain Pain Type: Acute pain Pain Location: Shoulder Pain Orientation:  Right;Left Pain Descriptors / Indicators: Aching Pain Frequency: Intermittent Pain Onset: On-going Patients Stated Pain Goal: 0 Pain Intervention(s): Medication (See eMAR)    Therapy/Group: Group Therapy  Larina Lieurance A Karman Veney 06/22/2021, 12:40 PM

## 2021-06-21 NOTE — Progress Notes (Signed)
PROGRESS NOTE   Subjective/Complaints:  Rested well. No neck pain. Lying in bed without collar  ROS: Patient denies fever, rash, sore throat, blurred vision, nausea, vomiting, diarrhea, cough, shortness of breath or chest pain, joint or back pain, headache, or mood change.   Objective:   No results found. No results for input(s): WBC, HGB, HCT, PLT in the last 72 hours.   No results for input(s): NA, K, CL, CO2, GLUCOSE, BUN, CREATININE, CALCIUM in the last 72 hours.    Intake/Output Summary (Last 24 hours) at 06/21/2021 1101 Last data filed at 06/20/2021 1822 Gross per 24 hour  Intake 360 ml  Output --  Net 360 ml        Physical Exam: Vital Signs Blood pressure (!) 105/47, pulse 93, temperature 98.2 F (36.8 C), resp. rate 18, height 5\' 4"  (1.626 m), weight 103.4 kg, SpO2 98 %.    Constitutional: No distress . Vital signs reviewed. HEENT: NCAT, EOMI, oral membranes moist Neck: supple Cardiovascular: RRR without murmur. No JVD    Respiratory/Chest: CTA Bilaterally without wheezes or rales. Normal effort    GI/Abdomen: BS +, non-tender, non-distended Ext: no clubbing, cyanosis, or edema Psych: pleasant and cooperative  Neurological: alert  MS: neck still taut, tender with palp. Collar off Skin:incision cdi   Assessment/Plan: 1. Functional deficits which require 3+ hours per day of interdisciplinary therapy in a comprehensive inpatient rehab setting. Physiatrist is providing close team supervision and 24 hour management of active medical problems listed below. Physiatrist and rehab team continue to assess barriers to discharge/monitor patient progress toward functional and medical goals  Care Tool:  Bathing    Body parts bathed by patient: Chest, Abdomen, Front perineal area, Left arm, Right arm, Left upper leg, Right upper leg, Face, Right lower leg, Left lower leg, Buttocks   Body parts bathed by  helper: Buttocks     Bathing assist Assist Level: Contact Guard/Touching assist     Upper Body Dressing/Undressing Upper body dressing   What is the patient wearing?: Pull over shirt    Upper body assist Assist Level: Minimal Assistance - Patient > 75%    Lower Body Dressing/Undressing Lower body dressing      What is the patient wearing?: Pants, Incontinence brief     Lower body assist Assist for lower body dressing: Moderate Assistance - Patient 50 - 74%     Toileting Toileting    Toileting assist Assist for toileting: Minimal Assistance - Patient > 75%     Transfers Chair/bed transfer  Transfers assist  Chair/bed transfer activity did not occur: Refused  Chair/bed transfer assist level: Supervision/Verbal cueing     Locomotion Ambulation   Ambulation assist   Ambulation activity did not occur: Safety/medical concerns  Assist level: Supervision/Verbal cueing Assistive device: Rollator Max distance: 200   Walk 10 feet activity   Assist  Walk 10 feet activity did not occur: Safety/medical concerns  Assist level: Supervision/Verbal cueing Assistive device: Rollator   Walk 50 feet activity   Assist Walk 50 feet with 2 turns activity did not occur: Safety/medical concerns  Assist level: Supervision/Verbal cueing Assistive device: Rollator    Walk 150 feet activity  Assist Walk 150 feet activity did not occur: Safety/medical concerns  Assist level: Supervision/Verbal cueing Assistive device: Rollator    Walk 10 feet on uneven surface  activity   Assist Walk 10 feet on uneven surfaces activity did not occur: Safety/medical concerns   Assist level: Supervision/Verbal cueing Assistive device: Rollator   Wheelchair     Assist Is the patient using a wheelchair?: No (TBD)             Wheelchair 50 feet with 2 turns activity    Assist            Wheelchair 150 feet activity     Assist          Blood  pressure (!) 105/47, pulse 93, temperature 98.2 F (36.8 C), resp. rate 18, height 5\' 4"  (1.626 m), weight 103.4 kg, SpO2 98 %.  Medical Problem List and Plan: 1.  Anterior and posterior C1 ring fracture s/p occiput to C5 fusion             -patient may shower but incision must be covered             -ELOS/Goals: 10-14 days S             -Admit to CIR  -first day of evaluations- will do ASIA exam Thursday  D/c date 9/13   --Continue CIR therapies including PT, OT  2.  Antithrombotics: -DVT/anticoagulation:  Mechanical: Sequential compression devices, below knee Bilateral lower extremities 9/1- has been 7 days since surgery- ill start Lovenox- GFR is 22- so will start SQ heparin for now and check with pharmacy.              -antiplatelet therapy: N/A 3. Pain: continue Oxycodone prn-->decrease to 5 mg prn.              --On Gabapentin 300 mg bid  8/30- pt reports a HA this AM, but reports Oxy helps- doesn't want higher dose- ?makes her hallucinate". Will con't current dosing  9/1- pt reports pain horrific- appears like muscle spasms of neck- will d/c flexeril and add Zanaflex 2 mg TID- con't opiate dosing. 9/5- main pain is HA and neck pain- neck pain controlled- con't regimen 9/8- HA's about the same, but feeling better overall- con't topamax started for HA prevention 9/10 pt told me her pain was controlled today 4. Mood: LCSW to follow for evaluation and support.              -antipsychotic agents: N/a 5. Neuropsych: This patient is not fully capable of making decisions on her own behalf. 6. Skin/Wound Care: Monitor wound for healing.  7. Fluids/Electrolytes/Nutrition: Monitor I/O. Check lytes in am.  8. HTN: Monitor BID --on demadex, metoprolol,   9/2- BP controlle-d con't regimen 9. CKD: Question baseline --SCr-3.06 at admission.   8/30- pt's Cr up from 2.08 to 2.24- will encourage fluids and recheck in AM- wait on d/c'ing IV since Cr is up  8/31- Cr 1.99- and BUN 42- down from 2.24-  will wait on IVFs since at baseline today  9/1- recheck on Monday   9/5- Cr 2.95- start IVFs 75 cc/hour- x 1 day and recheck again today and tomorrow- to make sure is hers. BUN 84 and Cr 2.95  9/6- Cr maxxed out at 3.12- likely UTI and dehydration- Cr down to 2.66- will con't NS IVFs x 1 more day and recheck in AM  9/8- Cr back to baseline of 2.3- will recheck Monday 10.  Hypokalemia: K+ has been progressing down--will recheck today and supplement  8/30- K+ 3.4 this AM- will replete and recheck in AM  8/31- K+ 4.0- much improved  9/5- will recheck, but K+ 2.8- will recheck again- and order KCL 40 mEq x3.   9/6- K= 3.9- doing much better  9/8- K+ 3.6- is heading downwards again- will increase KCL to 40 mEq daily and recheck Monday.  11. H/o panic attacks/anxiety d/o: Decrease Xanax to HS, continue Vistaril tid prn 12.  H/o asthma: Continue Breo.  13. T2DM: Hgb A1C-6.2. Well controlled on Tulicity every Sun-->-will have pt ask son to bring it from home.  --Monitor BS ac/hs. Carb modified diet.  8/63- out of Trulicity- will need son to bring in- BG's doing well- con't regimen 9/1- did get trulicity this week- BG's look great- con't regimen  9/9- BG's look great- con't regimen 14. 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 Follows with Dr[facility]. Check ammonia level given confusion 15. Confusion s/p falls: CT head ordered.    8/30- CT was OK, except "aging brain" and small R parietal scalp hematoma- will monitor  9/10 cognitively appropriate 16. Neurogenic bowel and bladder  8/31- is requiring cathing sometimes and has bowel incontinence with sorbitol- if continues, will place bowel program- will see if need to start Flomax?  9/1- No incontinence in last 24 hours of urine- and bladder scans <180cc- wait on any meds. Bowel incontinence could have been due to Sorbitol?  9/2- pt reports still having BM's from the sorbitol- explained she was full of stool- and  should improve.   9/4: diarrhea improved- discussed high fiber diet to help avoid future constipation.   9/8- having regular BM's  con't regimen 17. Hypotension: diastolic down to 42: decrease Toprol to 175mg .  18. Leukocytosis/UTI  9/5- WBC 15.5- labs are so off and pt looks OK, so will recheck- now and AM- and ordered U/A and Cx- lungs sounds clear, but will check at U/A first and go from there.   9/6 - WBC 12.6- but also diluted- will start Keflex 250 mg q8 hours per Pharmacy because no documentation of issues with cephalosporins.   9/8- Switch to Levaquin since is (+) for Pseudomonas  9/9- said no Sx's, however is much more awake overall and not confused anymore per staff.     LOS: 12 days A FACE TO FACE EVALUATION WAS PERFORMED  Meredith Staggers 06/21/2021, 11:01 AM

## 2021-06-22 LAB — GLUCOSE, CAPILLARY
Glucose-Capillary: 118 mg/dL — ABNORMAL HIGH (ref 70–99)
Glucose-Capillary: 117 mg/dL — ABNORMAL HIGH (ref 70–99)
Glucose-Capillary: 136 mg/dL — ABNORMAL HIGH (ref 70–99)
Glucose-Capillary: 98 mg/dL (ref 70–99)

## 2021-06-22 NOTE — Progress Notes (Signed)
Occupational Therapy Session Note  Patient Details  Name: Becky Hobbs MRN: [id number] Date of Birth: 18-Mar-1946  Today's Date: 06/23/2021 OT Individual Time: [id number] and 1308-6578 OT Individual Time Calculation (min): 68 min and 58 min  Skilled Therapeutic Interventions/Progress Updates:    Pt greeted in bed, requesting to use the restroom. Setup for supine<sit with HOB raised, using the bedrail. Supervision for ambulatory transfer to the toilet using RW. Mod I for toileting with pt having +bladder void. She wanted to complete thorough perihygiene at this time with her vagisil. She was set up at the sink to proceed with bathing/dressing tasks at sit<stand level. Also completed oral care while seated. Pt remained in care of RN at close of session to receive morning medication including pain medicine.   2nd Session 1:1 tx (58 min) Pt greeted in the w/c, premedicated for pain. Both sisters were present for family education today. Reviewed pts need for 24/7 supervision during functional transfers/tasks. Sister [name] verbalized that they always stay with her and provide supervision during ADLs/IADLs. Reviewed walk-in shower transfers using the rollator with [facility] having hands on practice with supervising pt/providing cues during simulated transfer. Pts other sister was having knee issues and could not participate in hands on family training. Printed them out an energy conservation handout and reviewed functional implications during her routine at home. Emphasized to sisters importance of allowing pt to complete household activity at max level of independence to maintain current functional level. Reviewed how to cover her incision in shower. Pt already owns needed bathroom DME. Pt remained sitting up in the w/c at close of session, anticipating next therapist for continued family education.   Therapy Documentation Precautions:  Precautions Precautions: Fall, Cervical Precaution Comments: needs  reminder for precautions 8/29; no brace needed Restrictions Weight Bearing Restrictions: No Vital Signs: Therapy Vitals Pulse Rate: 87 BP: (!) 132/57 Patient Position (if appropriate): Sitting Oxygen Therapy O2 Device: Room Air Pain: shoulders and neck. Rest breaks provided during sessions Pain Assessment Pain Scale: 0-10 Pain Score: 0-No pain ADL: ADL Eating: Set up Grooming: Setup Upper Body Bathing: Supervision/safety Where Assessed-Upper Body Bathing: Chair Lower Body Bathing: Moderate assistance Where Assessed-Lower Body Bathing: Chair Upper Body Dressing: Maximal assistance Where Assessed-Upper Body Dressing: Chair Lower Body Dressing: Maximal assistance Where Assessed-Lower Body Dressing: Chair Toileting: Maximal assistance Where Assessed-Toileting: Bedside Commode Toilet Transfer: Minimal assistance Toilet Transfer Method: Stand pivot Toilet Transfer Equipment: Bedside commode     Therapy/Group: Individual Therapy  Haidan Nhan A Gibril Mastro 06/23/2021, 12:23 PM

## 2021-06-22 NOTE — Progress Notes (Signed)
Physical Therapy Session Note  Patient Details  Name: Becky Hobbs MRN: [id number] Date of Birth: [birth year]  Today's Date: 06/22/2021 PT Individual Time: 1005-1100 PT Individual Time Calculation (min): 55 min   Short Term Goals: Week 2:  PT Short Term Goal 1 (Week 2): =LTG due to ELOS  Skilled Therapeutic Interventions/Progress Updates:    Pt received seated in w/c in room dressing with assist from NT. This therapist takes over. Pt requires assist to pull shirt down over trunk. Assisted pt with threading BLE into pant legs, sit to stand with CGA with no AD and pt able to pull pants up over hips. Assisted pt with donning shoes dependently for time conservation. Ambulation up to 300 ft during session with rollator at Supervision level. Pt able to retrieve objects from the floor with use of reach and rollator at Supervision level, cues needed for safety. Pt reports she has a Secondary school teacher at home. Ascend/descend 14 x 6" stairs laterally with R handrail to simulate home environment with Supervision, cues for safe LE management and getting foot fully on the step before proceeding to next step. Car transfer with min A with use of rollator and 6" step to simulate car pt will d/c home in. Pt requires cues for safety and UE placement during transfer. Provided HEP for patient, see below:  Access Code: [email or web address] URL: [email or web address] Date: 06/22/2021 Prepared by: Excell Seltzer  Exercises Side Stepping with Counter Support - 1 x daily - 7 x weekly - 3 sets - 3 reps Mini Squat with Counter Support - 1 x daily - 7 x weekly - 2 sets - 10 reps Alternating Step Taps with Counter Support - 1 x daily - 7 x weekly - 2 sets - 10 reps Standing on Foam Pad - 1 x daily - 7 x weekly - 5 sets - 1 reps - 60 hold Romberg Stance on Foam Pad - 1 x daily - 7 x weekly - 5 sets - 1 reps - 60 hold Wide Tandem Stance on Foam Pad with Eyes Open - 1 x daily - 7 x weekly - 5 sets - 1 reps - 60 hold  Pt left  seated in chair in dayroom for next session with OT for dance group.   Therapy Documentation Precautions:  Precautions Precautions: Fall, Cervical Precaution Comments: needs reminder for precautions 8/29; no brace needed Restrictions Weight Bearing Restrictions: No     Therapy/Group: Individual Therapy  Excell Seltzer, PT, DPT, CSRS  06/22/2021, 12:32 PM

## 2021-06-23 ENCOUNTER — Other Ambulatory Visit (HOSPITAL_COMMUNITY): Payer: Self-pay

## 2021-06-23 LAB — BASIC METABOLIC PANEL
Anion gap: 10 (ref 5–15)
BUN: 40 mg/dL — ABNORMAL HIGH (ref 8–23)
CO2: 24 mmol/L (ref 22–32)
Calcium: 10.2 mg/dL (ref 8.9–10.3)
Chloride: 103 mmol/L (ref 98–111)
Creatinine, Ser: 2.88 mg/dL — ABNORMAL HIGH (ref 0.44–1.00)
GFR, Estimated: 17 mL/min — ABNORMAL LOW (ref >60)
Glucose, Bld: 114 mg/dL — ABNORMAL HIGH (ref 70–99)
Potassium: 4 mmol/L (ref 3.5–5.1)
Sodium: 137 mmol/L (ref 135–145)

## 2021-06-23 LAB — CBC
HCT: 28.9 % — ABNORMAL LOW (ref 36.0–46.0)
Hemoglobin: 8.8 g/dL — ABNORMAL LOW (ref 12.0–15.0)
MCH: 27.2 pg (ref 26.0–34.0)
MCHC: 30.4 g/dL (ref 30.0–36.0)
MCV: 89.5 fL (ref 80.0–100.0)
Platelets: 309 10*3/uL (ref 150–400)
RBC: 3.23 MIL/uL — ABNORMAL LOW (ref 3.87–5.11)
RDW: 15.4 % (ref 11.5–15.5)
WBC: 10.6 10*3/uL — ABNORMAL HIGH (ref 4.0–10.5)
nRBC: 0 % (ref 0.0–0.2)

## 2021-06-23 LAB — GLUCOSE, CAPILLARY
Glucose-Capillary: 107 mg/dL — ABNORMAL HIGH (ref 70–99)
Glucose-Capillary: 130 mg/dL — ABNORMAL HIGH (ref 70–99)
Glucose-Capillary: 113 mg/dL — ABNORMAL HIGH (ref 70–99)
Glucose-Capillary: 101 mg/dL — ABNORMAL HIGH (ref 70–99)

## 2021-06-23 MED ORDER — FLUTICASONE FUROATE-VILANTEROL 100-25 MCG/INH IN AEPB
1.0000 | INHALATION_SPRAY | Freq: Every day | RESPIRATORY_TRACT | 0 refills | Status: DC
  Filled 2021-06-23: qty 60, 30d supply, fill #0

## 2021-06-23 MED ORDER — GABAPENTIN 300 MG PO CAPS
ORAL_CAPSULE | ORAL | 0 refills | Status: DC
  Filled 2021-06-23: qty 90, 30d supply, fill #0

## 2021-06-23 MED ORDER — PANTOPRAZOLE SODIUM 40 MG PO TBEC
40.0000 mg | DELAYED_RELEASE_TABLET | Freq: Every day | ORAL | 0 refills | Status: DC
  Filled 2021-06-23: qty 30, 30d supply, fill #0

## 2021-06-23 MED ORDER — LEVOFLOXACIN 250 MG PO TABS
250.0000 mg | ORAL_TABLET | Freq: Every day | ORAL | 0 refills | Status: DC
  Filled 2021-06-23: qty 2, 2d supply, fill #0

## 2021-06-23 MED ORDER — ACETAMINOPHEN 325 MG PO TABS: 325.0000 mg | ORAL_TABLET | ORAL | Status: AC | PRN

## 2021-06-23 MED ORDER — SUMATRIPTAN SUCCINATE 100 MG PO TABS
100.0000 mg | ORAL_TABLET | ORAL | 0 refills | Status: AC | PRN
  Filled 2021-06-23: qty 10, 5d supply, fill #0

## 2021-06-23 MED ORDER — METOPROLOL SUCCINATE ER 25 MG PO TB24
175.0000 mg | ORAL_TABLET | Freq: Every day | ORAL | 0 refills | Status: DC
  Filled 2021-06-23: qty 320, 30d supply, fill #0

## 2021-06-23 MED ORDER — ALBUTEROL SULFATE HFA 108 (90 BASE) MCG/ACT IN AERS
2.0000 | INHALATION_SPRAY | Freq: Four times a day (QID) | RESPIRATORY_TRACT | 6 refills | Status: AC | PRN
  Filled 2021-06-23: qty 18, 25d supply, fill #0

## 2021-06-23 MED ORDER — MYRBETRIQ 50 MG PO TB24
50.0000 mg | ORAL_TABLET | Freq: Every day | ORAL | 0 refills | Status: AC
  Filled 2021-06-23 (×2): qty 30, 30d supply, fill #0

## 2021-06-23 MED ORDER — METHYLPHENIDATE HCL 5 MG PO TABS
5.0000 mg | ORAL_TABLET | Freq: Two times a day (BID) | ORAL | 0 refills | Status: DC
  Filled 2021-06-23: qty 60, 30d supply, fill #0

## 2021-06-23 MED ORDER — TRAZODONE HCL 50 MG PO TABS
25.0000 mg | ORAL_TABLET | Freq: Every evening | ORAL | 0 refills | Status: AC | PRN
  Filled 2021-06-23: qty 10, 10d supply, fill #0

## 2021-06-23 MED ORDER — ROSUVASTATIN CALCIUM 10 MG PO TABS
10.0000 mg | ORAL_TABLET | Freq: Every day | ORAL | 0 refills | Status: AC
  Filled 2021-06-23: qty 30, 30d supply, fill #0

## 2021-06-23 MED ORDER — CHOLECALCIFEROL 125 MCG (5000 UT) PO TABS
5000.0000 [IU] | ORAL_TABLET | Freq: Every day | ORAL | 0 refills | Status: AC
  Filled 2021-06-23: qty 30, 30d supply, fill #0

## 2021-06-23 MED ORDER — ROPINIROLE HCL 1 MG PO TABS
0.5000 mg | ORAL_TABLET | Freq: Three times a day (TID) | ORAL | 0 refills | Status: AC | PRN
  Filled 2021-06-23: qty 45, 30d supply, fill #0

## 2021-06-23 MED ORDER — TOPIRAMATE 25 MG PO TABS
25.0000 mg | ORAL_TABLET | Freq: Every day | ORAL | 0 refills | Status: DC
  Filled 2021-06-23: qty 30, 30d supply, fill #0

## 2021-06-23 MED ORDER — POTASSIUM CHLORIDE CRYS ER 20 MEQ PO TBCR
20.0000 meq | EXTENDED_RELEASE_TABLET | Freq: Every day | ORAL | 0 refills | Status: DC
  Filled 2021-06-23: qty 30, 30d supply, fill #0

## 2021-06-23 MED ORDER — ALLOPURINOL 100 MG PO TABS
100.0000 mg | ORAL_TABLET | Freq: Every day | ORAL | 0 refills | Status: AC
  Filled 2021-06-23: qty 30, 30d supply, fill #0

## 2021-06-23 MED ORDER — ALPRAZOLAM 0.5 MG PO TABS
0.5000 mg | ORAL_TABLET | Freq: Every day | ORAL | 0 refills | Status: AC
  Filled 2021-06-23: qty 30, 30d supply, fill #0

## 2021-06-23 MED ORDER — GABAPENTIN 300 MG PO CAPS
300.0000 mg | ORAL_CAPSULE | Freq: Two times a day (BID) | ORAL | 0 refills | Status: AC
  Filled 2021-06-23: qty 60, 30d supply, fill #0

## 2021-06-23 MED ORDER — RISAQUAD PO CAPS
1.0000 | ORAL_CAPSULE | Freq: Every day | ORAL | 0 refills | Status: DC
  Filled 2021-06-23: qty 30, 30d supply, fill #0

## 2021-06-23 MED ORDER — TIZANIDINE HCL 2 MG PO TABS
2.0000 mg | ORAL_TABLET | Freq: Three times a day (TID) | ORAL | 0 refills | Status: DC
  Filled 2021-06-23: qty 90, 30d supply, fill #0

## 2021-06-23 MED ORDER — TORSEMIDE 20 MG PO TABS
20.0000 mg | ORAL_TABLET | Freq: Every day | ORAL | 0 refills | Status: DC
  Filled 2021-06-23: qty 30, 30d supply, fill #0

## 2021-06-23 MED ORDER — VITAMIN B-12 1000 MCG PO TABS
2500.0000 ug | ORAL_TABLET | Freq: Every day | ORAL | 0 refills | Status: AC
  Filled 2021-06-23: qty 30, 12d supply, fill #0

## 2021-06-23 MED ORDER — LIDOCAINE 5 % EX PTCH
2.0000 | MEDICATED_PATCH | CUTANEOUS | 0 refills | Status: DC
  Filled 2021-06-23: qty 30, 15d supply, fill #0

## 2021-06-23 MED ORDER — OXYCODONE HCL 5 MG PO TABS
5.0000 mg | ORAL_TABLET | ORAL | 0 refills | Status: DC | PRN
  Filled 2021-06-23: qty 30, 5d supply, fill #0

## 2021-06-23 MED ORDER — TRULICITY 1.5 MG/0.5ML ~~LOC~~ SOAJ
1.5000 mg | SUBCUTANEOUS | 11 refills | Status: DC
  Filled 2021-06-23: qty 2, 28d supply, fill #0
  Filled 2021-06-23: qty 0.5, 7d supply, fill #0

## 2021-06-23 MED ORDER — LEVOTHYROXINE SODIUM 100 MCG PO TABS
100.0000 ug | ORAL_TABLET | Freq: Every day | ORAL | 0 refills | Status: AC
  Filled 2021-06-23: qty 30, 30d supply, fill #0

## 2021-06-23 NOTE — Progress Notes (Signed)
Physical Therapy Session Note  Patient Details  Name: Becky Hobbs MRN: [id number] Date of Birth: [birth year]  Today's Date: 06/23/2021 PT Individual Time: 0388-8280; 0349-1791 PT Individual Time Calculation (min): 75 min and 25 min  Short Term Goals: Week 2:  PT Short Term Goal 1 (Week 2): =LTG due to ELOS  Skilled Therapeutic Interventions/Progress Updates:    Session 1: Pt received seated in w/c in room, agreeable to PT session. No complaints of pain. Session focus on family education with patient's sisters. Reviewed sit to stand transfers (Supervision), gait with rollator (Supervision), stair navigation (Supervision laterally with one handrail), car transfer (CGA with rollator), and how to safely utilize 6" stool and RW to get into bed due to pt's bed height being tall (min A). Recommending that pt obtain shorter mattress for improved safety with bed transfers. Pt's sisters demonstrate good understanding of how to safely assist her at home as they were also assisting her prior to admission. Reviewed cervical precautions with patient and family. Pt left seated in w/c in room with needs in reach, sisters present at end of session.  Session 2: Pt received seated in w/c in room, agreeable to PT session. Pt reports some pain in her neck/shoulders and reports she has had pain creme applied for pain management. Reviewed HEP with patient and sisters and answered all remaining questions. Re-assessed BLE sensation and strength, see d/c note and Flowsheet for details. Pt declines to perform any further mobility this PM due to fatigue. Pt left seated in w/c in room with needs in reach, quick release belt in place.  Therapy Documentation Precautions:  Precautions Precautions: Fall, Cervical Precaution Comments: needs reminder for precautions 8/29; no brace needed Required Braces or Orthoses:  (wears cervical brace for comfort, not required) Restrictions Weight Bearing Restrictions:  No      Therapy/Group: Individual Therapy  Excell Seltzer, PT, DPT, CSRS  06/23/2021, 5:09 PM

## 2021-06-23 NOTE — Plan of Care (Signed)
  Problem: RH Balance Goal: LTG: Patient will maintain dynamic sitting balance (OT) Description: LTG:  Patient will maintain dynamic sitting balance with assistance during activities of daily living (OT) Outcome: Completed/Met Goal: LTG Patient will maintain dynamic standing with ADLs (OT) Description: LTG:  Patient will maintain dynamic standing balance with assist during activities of daily living (OT)  Outcome: Completed/Met   Problem: Sit to Stand Goal: LTG:  Patient will perform sit to stand in prep for activites of daily living with assistance level (OT) Description: LTG:  Patient will perform sit to stand in prep for activites of daily living with assistance level (OT) Outcome: Completed/Met   Problem: RH Grooming Goal: LTG Patient will perform grooming w/assist,cues/equip (OT) Description: LTG: Patient will perform grooming with assist, with/without cues using equipment (OT) Outcome: Completed/Met   Problem: RH Bathing Goal: LTG Patient will bathe all body parts with assist levels (OT) Description: LTG: Patient will bathe all body parts with assist levels (OT) Outcome: Completed/Met   Problem: RH Dressing Goal: LTG Patient will perform upper body dressing (OT) Description: LTG Patient will perform upper body dressing with assist, with/without cues (OT). Outcome: Completed/Met Goal: LTG Patient will perform lower body dressing w/assist (OT) Description: LTG: Patient will perform lower body dressing with assist, with/without cues in positioning using equipment (OT) Outcome: Completed/Met   Problem: RH Toileting Goal: LTG Patient will perform toileting task (3/3 steps) with assistance level (OT) Description: LTG: Patient will perform toileting task (3/3 steps) with assistance level (OT)  Outcome: Completed/Met   Problem: RH Functional Use of Upper Extremity Goal: LTG Patient will use RT/LT upper extremity as a (OT) Description: LTG: Patient will use right/left upper  extremity as a stabilizer/gross assist/diminished/nondominant/dominant level with assist, with/without cues during functional activity (OT) Outcome: Completed/Met   Problem: RH Toilet Transfers Goal: LTG Patient will perform toilet transfers w/assist (OT) Description: LTG: Patient will perform toilet transfers with assist, with/without cues using equipment (OT) Outcome: Completed/Met   Problem: RH Tub/Shower Transfers Goal: LTG Patient will perform tub/shower transfers w/assist (OT) Description: LTG: Patient will perform tub/shower transfers with assist, with/without cues using equipment (OT) Outcome: Completed/Met   Problem: RH Memory Goal: LTG Patient will demonstrate ability for day to day recall/carry over during activities of daily living with assistance level (OT) Description: LTG:  Patient will demonstrate ability for day to day recall/carry over during activities of daily living with assistance level (OT). Outcome: Completed/Met

## 2021-06-23 NOTE — Progress Notes (Signed)
Occupational Therapy Discharge Summary  Patient Details  Name: Becky Hobbs MRN: [id number] Date of Birth: 08/03/1946   Patient has met 12 of 12 long term goals due to improved activity tolerance, improved balance, postural control, ability to compensate for deficits, functional use of  RIGHT upper extremity, improved attention, improved awareness, and improved coordination.  Patient to discharge at overall Supervision-Mod I level.  Patient's family members are independent to provide the necessary assistance at discharge. Pt reports already having the sock aide and heavy duty reacher at home to use during dressing.   All goals met.  Recommendation:  Patient will benefit from ongoing skilled OT services in home health setting to continue to advance functional skills in the area of BADL and iADL.  Equipment: No equipment provided. Pt already has a shower chair + BSC  Reasons for discharge: treatment goals met and discharge from hospital  Patient/family agrees with progress made and goals achieved: Yes  OT Discharge Precautions/Restrictions  Precautions Precautions: Fall;Cervical Restrictions Weight Bearing Restrictions: No Vital Signs Therapy Vitals Pulse Rate: 87 BP: (!) 132/57 Patient Position (if appropriate): Sitting Oxygen Therapy O2 Device: Room Air Pain Pain Assessment Pain Scale: 0-10 Pain Score: 0-No pain ADL ADL Eating: Set up Grooming: Independent Where Assessed-Grooming: Sitting at sink Upper Body Bathing: Supervision/safety Where Assessed-Upper Body Bathing: Sitting at sink Lower Body Bathing: Supervision/safety Where Assessed-Lower Body Bathing: Sitting at sink, Standing at sink Upper Body Dressing: Independent Where Assessed-Upper Body Dressing: Sitting at sink Lower Body Dressing: Supervision/safety Where Assessed-Lower Body Dressing: Sitting at sink, Standing at sink Toileting: Modified independent Where Assessed-Toileting: Toilet Toilet Transfer:  Close supervision Toilet Transfer Method: Ambulating (RW) Toilet Transfer Equipment: Raised toilet seat Walk-In Shower Transfer: Close supervision Walk-In Shower Transfer Method: Ambulating (RW) Walk-In Shower Equipment: Transfer tub bench Vision Baseline Vision/History: 1 Wears glasses Patient Visual Report: No change from baseline  Cognition Overall Cognitive Status: Within Functional Limits for tasks assessed Orientation Level: Oriented X4 Safety/Judgment: Appears intact Sensation Coordination Gross Motor Movements are Fluid and Coordinated: Yes Fine Motor Movements are Fluid and Coordinated: Yes Trunk/Postural Assessment  Cervical Assessment Cervical Assessment: Exceptions to WFL (cervical precautions) Thoracic Assessment Thoracic Assessment: Exceptions to WFL (rounded shoulders) Lumbar Assessment Lumbar Assessment: Exceptions to WFL (posterior pelvic tilt) Postural Control Postural Control: Deficits on evaluation (pt requires an AD during functional standing activity to safely maintain her balance at supervision-Mod I level)  Balance Balance Balance Assessed: Yes Dynamic Sitting Balance Dynamic Sitting - Balance Support: During functional activity Dynamic Sitting - Level of Assistance: 7: Independent (donning pants) Dynamic Standing Balance Dynamic Standing - Balance Support: During functional activity Dynamic Standing - Level of Assistance: 6: Modified independent (Device/Increase time) Dynamic Standing - Balance Activities: Lateral lean/weight shifting;Forward lean/weight shifting (toileting) Extremity/Trunk Assessment RUE Assessment RUE Assessment: Within Functional Limits LUE Assessment LUE Assessment: Within Functional Limits    A  06/23/2021, 12:28 PM 

## 2021-06-23 NOTE — Progress Notes (Signed)
Physical Therapy Discharge Summary  Patient Details  Name: Becky Hobbs MRN: [id number] Date of Birth: [birth year]  Patient has met 8 of 10 long term goals due to improved activity tolerance, improved balance, improved postural control, increased strength, decreased pain, ability to compensate for deficits, improved attention, and improved awareness.  Patient to discharge at an ambulatory level Supervision.   Patient's care partner is independent to provide the necessary physical and cognitive assistance at discharge. Pt's sisters have completed hands on family education and are safe to assist patient upon d/c home.  Reasons goals not met: Pt did not meet car transfer goal of Supervision as she still requires CGA for safety for transfer. Pt also did not meet w/c mobility goal as she became ambulatory and w/c mobility was no longer a focus of her therapy sessions and she will not utilize a w/c upon d/c home.  Recommendation:  Patient will benefit from ongoing skilled PT services in home health setting to continue to advance safe functional mobility, address ongoing impairments in endurance, strength, balance, safety, independence with functional mobility, pain management, and minimize fall risk.  Equipment: No equipment provided. Pt already owns rollator and RW at home.  Reasons for discharge: treatment goals met and discharge from hospital  Patient/family agrees with progress made and goals achieved: Yes  PT Discharge Precautions/Restrictions Precautions Precautions: Fall;Cervical Required Braces or Orthoses:  (wears cervical brace for comfort, not required) Restrictions Weight Bearing Restrictions: No Pain Interference Pain Interference Pain Effect on Sleep: 3. Frequently Pain Interference with Therapy Activities: 4. Almost constantly Pain Interference with Day-to-Day Activities: 4. Almost constantly Vision/Perception  Perception Perception: Within Functional  Limits Praxis Praxis: Intact  Cognition Overall Cognitive Status: Within Functional Limits for tasks assessed Arousal/Alertness: Awake/alert Orientation Level: Oriented X4 Year: 2022 Month: September Day of Week: Correct Attention: Focused Focused Attention: Appears intact Memory: Appears intact Awareness: Appears intact Problem Solving: Appears intact Safety/Judgment: Appears intact Sensation Sensation Light Touch: Appears Intact Hot/Cold: Appears Intact Proprioception: Appears Intact Coordination Gross Motor Movements are Fluid and Coordinated: Yes Fine Motor Movements are Fluid and Coordinated: Yes Coordination and Movement Description: improved since eval Motor  Motor Motor: Abnormal postural alignment and control Motor - Discharge Observations: impaired 2/2 pain, improved since eval  Mobility Bed Mobility Bed Mobility: Rolling Right;Rolling Left;Supine to Sit;Sit to Supine Rolling Right: Independent with assistive device Rolling Left: Independent with assistive device Supine to Sit: Independent with assistive device Sit to Supine: Independent with assistive device Transfers Transfers: Sit to Stand;Stand to Sit;Stand Pivot Transfers Sit to Stand: Supervision/Verbal cueing Stand to Sit: Supervision/Verbal cueing Stand Pivot Transfers: Supervision/Verbal cueing Stand Pivot Transfer Details: Visual cues/gestures for precautions/safety Transfer (Assistive device): 4-wheeled walker Locomotion  Gait Ambulation: Yes Gait Assistance: Supervision/Verbal cueing Gait Distance (Feet): 200 Feet Assistive device: 4-wheeled walker Gait Assistance Details: Verbal cues for precautions/safety Gait Gait: Yes Gait Pattern: Within Functional Limits Gait velocity: decreased Stairs / Additional Locomotion Stairs: Yes Stairs Assistance: Supervision/Verbal cueing Stair Management Technique: One rail Right;Sideways Number of Stairs: 14 Height of Stairs: 6 Ramp:  Supervision/Verbal cueing Wheelchair Mobility Wheelchair Mobility: No  Trunk/Postural Assessment  Cervical Assessment Cervical Assessment: Exceptions to [facility] (cervical precautions; limited ROM due to pain) Thoracic Assessment Thoracic Assessment: Exceptions to [facility] (rounded shoulders) Lumbar Assessment Lumbar Assessment: Exceptions to [facility] (posterior pelvic tilt) Postural Control Postural Control: Deficits on evaluation  Balance Balance Balance Assessed: Yes Static Sitting Balance Static Sitting - Balance Support: No upper extremity supported;Feet supported Static Sitting - Level of Assistance:  6: Modified independent (Device/Increase time) Dynamic Sitting Balance Dynamic Sitting - Balance Support: No upper extremity supported;Feet supported;During functional activity Dynamic Sitting - Level of Assistance: 6: Modified independent (Device/Increase time) Static Standing Balance Static Standing - Balance Support: Bilateral upper extremity supported;During functional activity Static Standing - Level of Assistance: 6: Modified independent (Device/Increase time);5: Stand by assistance Dynamic Standing Balance Dynamic Standing - Balance Support: Bilateral upper extremity supported;During functional activity Dynamic Standing - Level of Assistance: 6: Modified independent (Device/Increase time) Extremity Assessment   RLE Assessment RLE Assessment: Within Functional Limits General Strength Comments: 5/5 grossly LLE Assessment LLE Assessment: Within Functional Limits General Strength Comments: 5/5 grossly     Excell Seltzer, PT, DPT, CSRS 06/23/2021, 4:47 PM

## 2021-06-24 ENCOUNTER — Other Ambulatory Visit (HOSPITAL_COMMUNITY): Payer: Self-pay

## 2021-06-24 LAB — GLUCOSE, CAPILLARY
Glucose-Capillary: 114 mg/dL — ABNORMAL HIGH (ref 70–99)
Glucose-Capillary: 127 mg/dL — ABNORMAL HIGH (ref 70–99)

## 2021-06-24 MED ORDER — POTASSIUM CHLORIDE CRYS ER 20 MEQ PO TBCR
20.0000 meq | EXTENDED_RELEASE_TABLET | Freq: Every day | ORAL | Status: DC
  Administered 2021-06-24: 20 meq via ORAL
  Filled 2021-06-24: qty 1

## 2021-06-24 MED ORDER — POTASSIUM CHLORIDE CRYS ER 20 MEQ PO TBCR
20.0000 meq | EXTENDED_RELEASE_TABLET | Freq: Every day | ORAL | 0 refills | Status: DC
  Filled 2021-06-24: qty 30, 30d supply, fill #0

## 2021-06-24 MED ORDER — TORSEMIDE 10 MG PO TABS
10.0000 mg | ORAL_TABLET | Freq: Every day | ORAL | 0 refills | Status: DC
  Filled 2021-06-24: qty 30, 30d supply, fill #0

## 2021-06-24 MED ORDER — TORSEMIDE 20 MG PO TABS
10.0000 mg | ORAL_TABLET | Freq: Every day | ORAL | Status: DC
  Administered 2021-06-24: 10 mg via ORAL
  Filled 2021-06-24: qty 1

## 2021-06-24 NOTE — Progress Notes (Signed)
PROGRESS NOTE   Subjective/Complaints:  Pt worried about Cr being up- said drinks a LOT of water- 3/4 gallon a day some days.  So we decided together to decrease Torsemide to 10 mg daily and reduce KCL as a result- ready for d/c and she said will call PCP Dr Brigitte Pulse and get in for labs in the next 1 week.  Will also f/u with me.   ROS:   Pt denies SOB, abd pain, CP, N/V/C/D, and vision changes   Objective:   No results found. Recent Labs    06/23/21 0642  WBC 10.6*  HGB 8.8*  HCT 28.9*  PLT 309     Recent Labs    06/23/21 0642  NA 137  K 4.0  CL 103  CO2 24  GLUCOSE 114*  BUN 40*  CREATININE 2.88*  CALCIUM 10.2      Intake/Output Summary (Last 24 hours) at 06/24/2021 0856 Last data filed at 06/23/2021 1815 Gross per 24 hour  Intake 480 ml  Output --  Net 480 ml        Physical Exam: Vital Signs Blood pressure (!) 119/50, pulse 82, temperature 97.7 F (36.5 C), resp. rate 18, height 5\' 4"  (1.626 m), weight 103.4 kg, SpO2 (!) 86 %.    General: awake, alert, appropriate, laying in bed- no collar in bed;  NAD HENT: conjugate gaze; oropharynx moist; incision scabbed over- but no drainage, no erythema CV: regular rate; no JVD Pulmonary: CTA B/L; no W/R/R- good air movement GI: soft, NT, ND, (+)BS Psychiatric: appropriate Neurological: Ox3; interactive  Ext: no clubbing, cyanosis, or edema Psych: pleasant and cooperative  Neurological: alert  MS: neck still taut, tender with palp. Collar off- less tight Skin:incision cdi   Assessment/Plan: 1. Functional deficits which require 3+ hours per day of interdisciplinary therapy in a comprehensive inpatient rehab setting. Physiatrist is providing close team supervision and 24 hour management of active medical problems listed below. Physiatrist and rehab team continue to assess barriers to discharge/monitor patient progress toward functional and medical  goals  Care Tool:  Bathing    Body parts bathed by patient: Chest, Abdomen, Front perineal area, Left arm, Right arm, Left upper leg, Right upper leg, Face, Right lower leg, Left lower leg, Buttocks   Body parts bathed by helper: Buttocks     Bathing assist Assist Level: Supervision/Verbal cueing     Upper Body Dressing/Undressing Upper body dressing   What is the patient wearing?: Pull over shirt    Upper body assist Assist Level: Independent    Lower Body Dressing/Undressing Lower body dressing      What is the patient wearing?: Pants     Lower body assist Assist for lower body dressing: Supervision/Verbal cueing     Toileting Toileting    Toileting assist Assist for toileting: Independent with assistive device     Transfers Chair/bed transfer  Transfers assist  Chair/bed transfer activity did not occur: Refused  Chair/bed transfer assist level: Supervision/Verbal cueing     Locomotion Ambulation   Ambulation assist   Ambulation activity did not occur: Safety/medical concerns  Assist level: Supervision/Verbal cueing Assistive device: Rollator Max distance: 300'   Walk  10 feet activity   Assist  Walk 10 feet activity did not occur: Safety/medical concerns  Assist level: Supervision/Verbal cueing Assistive device: Rollator   Walk 50 feet activity   Assist Walk 50 feet with 2 turns activity did not occur: Safety/medical concerns  Assist level: Supervision/Verbal cueing Assistive device: Rollator    Walk 150 feet activity   Assist Walk 150 feet activity did not occur: Safety/medical concerns  Assist level: Supervision/Verbal cueing Assistive device: Rollator    Walk 10 feet on uneven surface  activity   Assist Walk 10 feet on uneven surfaces activity did not occur: Safety/medical concerns   Assist level: Supervision/Verbal cueing Assistive device: Rollator   Wheelchair     Assist Is the patient using a wheelchair?: No              Wheelchair 50 feet with 2 turns activity    Assist            Wheelchair 150 feet activity     Assist          Blood pressure (!) 119/50, pulse 82, temperature 97.7 F (36.5 C), resp. rate 18, height 5\' 4"  (1.626 m), weight 103.4 kg, SpO2 (!) 86 %.  Medical Problem List and Plan: 1.  Anterior and posterior C1 ring fracture s/p occiput to C5 fusion             -patient may shower but incision must be covered             -ELOS/Goals: 10-14 days S             -Admit to CIR  -first day of evaluations- will do ASIA exam Thursday  D/c date 9/13   -d/c today- will f/u with PCP in 1 week and me in f/u.  2.  Antithrombotics: -DVT/anticoagulation:  Mechanical: Sequential compression devices, below knee Bilateral lower extremities 9/1- has been 7 days since surgery- ill start Lovenox- GFR is 22- so will start SQ heparin for now and check with pharmacy.              -antiplatelet therapy: N/A 3. Pain: continue Oxycodone prn-->decrease to 5 mg prn.              --On Gabapentin 300 mg bid  8/30- pt reports a HA this AM, but reports Oxy helps- doesn't want higher dose- ?makes her hallucinate". Will con't current dosing  9/1- pt reports pain horrific- appears like muscle spasms of neck- will d/c flexeril and add Zanaflex 2 mg TID- con't opiate dosing. 9/5- main pain is HA and neck pain- neck pain controlled- con't regimen 9/8- HA's about the same, but feeling better overall- con't topamax started for HA prevention 9/10 pt told me her pain was controlled today  9/13- pain much better controlled- cont' regimen 4. Mood: LCSW to follow for evaluation and support.              -antipsychotic agents: N/a 5. Neuropsych: This patient is not fully capable of making decisions on her own behalf. 6. Skin/Wound Care: Monitor wound for healing.  7. Fluids/Electrolytes/Nutrition: Monitor I/O. Check lytes in am.  8. HTN: Monitor BID --on demadex, metoprolol,   9/2- BP controlle-d  con't regimen 9. CKD: Question baseline --SCr-3.06 at admission.   8/30- pt's Cr up from 2.08 to 2.24- will encourage fluids and recheck in AM- wait on d/c'ing IV since Cr is up  8/31- Cr 1.99- and BUN 42- down from 2.24- will wait on IVFs  since at baseline today  9/1- recheck on Monday   9/5- Cr 2.95- start IVFs 75 cc/hour- x 1 day and recheck again today and tomorrow- to make sure is hers. BUN 84 and Cr 2.95  9/6- Cr maxxed out at 3.12- likely UTI and dehydration- Cr down to 2.66- will con't NS IVFs x 1 more day and recheck in AM  9/8- Cr back to baseline of 2.3- will recheck Monday  9/13-Cr 2.88- will reduce Torsemide to 10 mg daily and reduce KCL down to 20 mEq to compensate- have pt see PCP in 1 week.  10.  Hypokalemia: K+ has been progressing down--will recheck today and supplement  8/30- K+ 3.4 this AM- will replete and recheck in AM  8/31- K+ 4.0- much improved  9/5- will recheck, but K+ 2.8- will recheck again- and order KCL 40 mEq x3.   9/6- K= 3.9- doing much better  9/8- K+ 3.6- is heading downwards again- will increase KCL to 40 mEq daily and recheck Monday.   9/13- as above 11. H/o panic attacks/anxiety d/o: Decrease Xanax to HS, continue Vistaril tid prn 12.  H/o asthma: Continue Breo.  13. T2DM: Hgb A1C-6.2. Well controlled on Tulicity every Sun-->-will have pt ask son to bring it from home.  --Monitor BS ac/hs. Carb modified diet.  6/06- out of Trulicity- will need son to bring in- BG's doing well- con't regimen 9/1- did get trulicity this week- BG's look great- con't regimen  9/9- BG's look great- con't regimen 14. 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 Follows with Dr. [facility]. Check ammonia level given confusion 15. Confusion s/p falls: CT head ordered.    8/30- CT was OK, except "aging brain" and small R parietal scalp hematoma- will monitor  9/10 cognitively appropriate 16. Neurogenic bowel and bladder  8/31- is requiring  cathing sometimes and has bowel incontinence with sorbitol- if continues, will place bowel program- will see if need to start Flomax?  9/1- No incontinence in last 24 hours of urine- and bladder scans <180cc- wait on any meds. Bowel incontinence could have been due to Sorbitol?  9/2- pt reports still having BM's from the sorbitol- explained she was full of stool- and should improve.   9/4: diarrhea improved- discussed high fiber diet to help avoid future constipation.   9/8- having regular BM's  con't regimen 17. Hypotension: diastolic down to 42: decrease Toprol to 175mg .  18. Leukocytosis/UTI  9/5- WBC 15.5- labs are so off and pt looks OK, so will recheck- now and AM- and ordered U/A and Cx- lungs sounds clear, but will check at U/A first and go from there.   9/6 - WBC 12.6- but also diluted- will start Keflex 250 mg q8 hours per Pharmacy because no documentation of issues with cephalosporins.   9/8- Switch to Levaquin since is (+) for Pseudomonas  9/9- said no Sx's, however is much more awake overall and not confused anymore per staff.   9/13- will send home to finish out Levaquin    LOS: 15 days A FACE TO FACE EVALUATION WAS PERFORMED  Aldena Worm 06/24/2021, 8:56 AM

## 2021-06-24 NOTE — Progress Notes (Signed)
Inpatient Rehabilitation Care Coordinator Discharge Note   Patient Details  Name: Becky Hobbs MRN: [id number] Date of Birth: [birth year]   Discharge location: D/c to home with support from her sister and son.  Length of Stay: 15 days  Discharge activity level: Supervision to Mod I  Home/community participation: Limited  Patient response MG:[id number] Literacy - How often do you need to have someone help you when you read instructions, pamphlets, or other written material from your doctor or pharmacy?: Never  Patient response [id number] Isolation - How often do you feel lonely or isolated from those around you?: Never  Services provided included: MD, RD, PT, OT, RN, CM, TR, Pharmacy, Neuropsych, SW  Financial Services:  Charity fundraiser Utilized: Medicare BCBS supplement  Choices offered to/list presented to: Yes  Follow-up services arranged:  [place]: [place] for HHPT/OT/SN/aide     Patient response to transportation need: Is the patient able to respond to transportation needs?: Yes In the past 12 months, has lack of transportation kept you from medical appointments or from getting medications?: No In the past 12 months, has lack of transportation kept you from meetings, work, or from getting things needed for daily living?: No  Comments (or additional information):  Patient/Family verbalized understanding of follow-up arrangements:  Yes  Individual responsible for coordination of the follow-up plan: contact pt [phone] or pt sister Becky Hobbs [phone]  Confirmed correct DME delivered: Becky Hobbs 06/24/2021    Becky Hobbs

## 2021-06-25 ENCOUNTER — Telehealth: Payer: Self-pay | Admitting: Physical Medicine and Rehabilitation

## 2021-06-25 DIAGNOSIS — I129 Hypertensive chronic kidney disease with stage 1 through stage 4 chronic kidney disease, or unspecified chronic kidney disease: Secondary | ICD-10-CM | POA: Diagnosis not present

## 2021-06-25 DIAGNOSIS — K219 Gastro-esophageal reflux disease without esophagitis: Secondary | ICD-10-CM | POA: Diagnosis not present

## 2021-06-25 DIAGNOSIS — J45909 Unspecified asthma, uncomplicated: Secondary | ICD-10-CM | POA: Diagnosis not present

## 2021-06-25 DIAGNOSIS — N319 Neuromuscular dysfunction of bladder, unspecified: Secondary | ICD-10-CM | POA: Diagnosis not present

## 2021-06-25 DIAGNOSIS — N189 Chronic kidney disease, unspecified: Secondary | ICD-10-CM | POA: Diagnosis not present

## 2021-06-25 DIAGNOSIS — W19XXXD Unspecified fall, subsequent encounter: Secondary | ICD-10-CM | POA: Diagnosis not present

## 2021-06-25 DIAGNOSIS — Z9181 History of falling: Secondary | ICD-10-CM | POA: Diagnosis not present

## 2021-06-25 DIAGNOSIS — E1122 Type 2 diabetes mellitus with diabetic chronic kidney disease: Secondary | ICD-10-CM | POA: Diagnosis not present

## 2021-06-25 DIAGNOSIS — Z79891 Long term (current) use of opiate analgesic: Secondary | ICD-10-CM | POA: Diagnosis not present

## 2021-06-25 DIAGNOSIS — K592 Neurogenic bowel, not elsewhere classified: Secondary | ICD-10-CM | POA: Diagnosis not present

## 2021-06-25 DIAGNOSIS — G43909 Migraine, unspecified, not intractable, without status migrainosus: Secondary | ICD-10-CM | POA: Diagnosis not present

## 2021-06-25 DIAGNOSIS — B1920 Unspecified viral hepatitis C without hepatic coma: Secondary | ICD-10-CM | POA: Diagnosis not present

## 2021-06-25 DIAGNOSIS — F410 Panic disorder [episodic paroxysmal anxiety] without agoraphobia: Secondary | ICD-10-CM | POA: Diagnosis not present

## 2021-06-25 DIAGNOSIS — I051 Rheumatic mitral insufficiency: Secondary | ICD-10-CM | POA: Diagnosis not present

## 2021-06-25 DIAGNOSIS — Z87891 Personal history of nicotine dependence: Secondary | ICD-10-CM | POA: Diagnosis not present

## 2021-06-25 DIAGNOSIS — Z7951 Long term (current) use of inhaled steroids: Secondary | ICD-10-CM | POA: Diagnosis not present

## 2021-06-25 DIAGNOSIS — S12090K Other displaced fracture of first cervical vertebra, subsequent encounter for fracture with nonunion: Secondary | ICD-10-CM | POA: Diagnosis not present

## 2021-06-25 DIAGNOSIS — D631 Anemia in chronic kidney disease: Secondary | ICD-10-CM | POA: Diagnosis not present

## 2021-06-25 DIAGNOSIS — Z981 Arthrodesis status: Secondary | ICD-10-CM | POA: Diagnosis not present

## 2021-06-25 DIAGNOSIS — M1990 Unspecified osteoarthritis, unspecified site: Secondary | ICD-10-CM | POA: Diagnosis not present

## 2021-06-25 DIAGNOSIS — Z79899 Other long term (current) drug therapy: Secondary | ICD-10-CM | POA: Diagnosis not present

## 2021-06-25 DIAGNOSIS — K7460 Unspecified cirrhosis of liver: Secondary | ICD-10-CM | POA: Diagnosis not present

## 2021-06-25 DIAGNOSIS — Z87440 Personal history of urinary (tract) infections: Secondary | ICD-10-CM | POA: Diagnosis not present

## 2021-06-25 NOTE — Telephone Encounter (Signed)
Approval given

## 2021-06-25 NOTE — Telephone Encounter (Signed)
Erlene Quan with [facility] needs to see patient 2w8, okay to leave a voicemail on [phone] if he doesn't answer.

## 2021-06-26 DIAGNOSIS — K7460 Unspecified cirrhosis of liver: Secondary | ICD-10-CM

## 2021-06-26 NOTE — Discharge Summary (Signed)
Physician Discharge Summary  Patient ID: Becky Hobbs MRN: [id number] DOB/AGE: [birth year] 75 y.o.  Admit date: 06/09/2021 Discharge date: 06/24/2021  Discharge Diagnoses:  Principal Problem:   Fracture of C1 vertebra, closed [facility])   Discharged Condition: good  Significant Diagnostic Studies: DG Chest 2 View  Result Date: 06/16/2021 CLINICAL DATA:  Elevated white blood cell count. EXAM: CHEST - 2 VIEW COMPARISON:  Most recent chest radiograph 02/09/2018 FINDINGS: The cardiomediastinal contours are normal for AP technique. Mild elevation of left hemidiaphragm is unchanged. Pulmonary vasculature is normal. Streaky atelectasis or scar at the left lung base. No consolidation, pleural effusion, or pneumothorax. No acute osseous abnormalities are seen. Thoracolumbar fusion hardware. Cervical hardware partially included. IMPRESSION: Streaky atelectasis or scar at the left lung base. No pneumonia. Electronically Signed   By: Keith Rake M.D.   On: 06/16/2021 18:35   DG Cervical Spine 1 View  Result Date: 06/06/2021 CLINICAL DATA:  Intraoperative localization. EXAM: DG CERVICAL SPINE - 1 VIEW COMPARISON:  None. FINDINGS: Single cross-table lateral portable spot fluoro image obtained of the upper cervical spine. Support catheters are seen overlying the hypopharynx. IMPRESSION: Intraoperative localization during posterior cervical fusion. Electronically Signed   By: Misty Stanley M.D.   On: 06/06/2021 11:24   CT HEAD WO CONTRAST (5MM)  Result Date: 06/10/2021 CLINICAL DATA:  Encephalopathy.  Falls. EXAM: CT HEAD WITHOUT CONTRAST TECHNIQUE: Contiguous axial images were obtained from the base of the skull through the vertex without intravenous contrast. COMPARISON:  None. FINDINGS: Brain: There is no mass, hemorrhage or extra-axial collection. There is generalized atrophy without lobar predilection. Hypodensity of the white matter is most commonly associated with chronic microvascular disease.  Vascular: No abnormal hyperdensity of the major intracranial arteries or dural venous sinuses. No intracranial atherosclerosis. Skull: Craniocervical fusion. Small right parietal scalp hematoma. No skull fracture. Sinuses/Orbits: No fluid levels or advanced mucosal thickening of the visualized paranasal sinuses. No mastoid or middle ear effusion. The orbits are normal. IMPRESSION: 1. No acute intracranial abnormality. 2. Normal aging brain. 3. Small right parietal scalp hematoma. Electronically Signed   By: Ulyses Jarred M.D.   On: 06/10/2021 01:34   DG C-Arm 1-60 Min-No Report  Result Date: 06/05/2021 Fluoroscopy was utilized by the requesting physician.  No radiographic interpretation.   CT OArm Limited Study-No Report  Result Date: 06/05/2021 Fluoroscopy was utilized by the requesting physician.  No radiographic interpretation.    Labs:  Basic Metabolic Panel: BMP Latest Ref Rng & Units 06/23/2021 06/18/2021 06/17/2021  Glucose 70 - 99 mg/dL 114(H) 103(H) 141(H)  BUN 8 - 23 mg/dL 40(H) 54(H) 74(H)  Creatinine 0.44 - 1.00 mg/dL 2.88(H) 2.23(H) 2.66(H)  Sodium 135 - 145 mmol/L 137 138 134(L)  Potassium 3.5 - 5.1 mmol/L 4.0 3.6 3.9  Chloride 98 - 111 mmol/L 103 103 98  CO2 22 - 32 mmol/L 24 27 26   Calcium 8.9 - 10.3 mg/dL 10.2 9.5 9.1     CBC: CBC Latest Ref Rng & Units 06/23/2021 06/18/2021 06/17/2021  WBC 4.0 - 10.5 K/uL 10.6(H) 9.7 12.6(H)  Hemoglobin 12.0 - 15.0 g/dL 8.8(L) 9.8(L) 9.1(L)  Hematocrit 36.0 - 46.0 % 28.9(L) 31.0(L) 28.5(L)  Platelets 150 - 400 K/uL 309 354 326     CBG: Recent Labs  Lab 06/23/21 1120 06/23/21 1618 06/23/21 2055 06/24/21 0602 06/24/21 1140  GLUCAP 130* 113* 101* 114* 127*    Brief HPI:   Becky Hobbs is a 75 y.o. female with history of CKD, migraines, cirrhosis, T2DM,  anxiety disorder, fall in December 2021 with resultant C1 ring fracture that was Treated conservatively.  However she continued have multiple falls with lateral listhesis, severe  occipital headache and right neck pain.  She was admitted on 06/05/2021 for C1-C5 arthrodesis by Dr. Reatha Armour.  Postop continues to have limitations due to severe pain in upper back and shoulders affecting ADLs and mobility.  CIR was recommended to functional decline.   Hospital Course: Becky Hobbs was admitted to rehab 06/09/2021 for inpatient therapies to consist of PT, ST and OT at least three hours five days a week. Past admission physiatrist, therapy team and rehab RN have worked together to provide customized collaborative inpatient rehab.  She continued to relate significant headache neck pain felt to be due to muscle spasm.  Flexeril was ineffective therefore Zanaflex was added in addition to Topamax with improvement in pain management.  Her blood pressures were monitored on TID basis and has been controlled on current regimen.    She has had issues with acute on chronic renal failure has been monitored and was hydrated with IVF intermittently.She was also found to have UTI likely contributing to renal status. Pseudomonas UTI was treated with  She was also  to increase fluid intake but she had concerns about her renal status therefore torsemide was decreased to 10 mg daily with decrease in KCL and recommend follow up labs by PCP in a week for monitoring. BUN/SCr are improving and hyponatremia has resolved.  Serial CBC shows reactive leukocytosis is resolving and H&H is relatively stable.  Her diabetes has been monitored with ac/hs CBG checks and SSI was use prn for tighter BS control. Po intake has improved and BS are well controlled on current regimen.  She has had issues with extreme sedation and therefore Xanax was decreased to bedtime and Vistaril use as needed.  Ritalin was added for activation with improvement in alertness and attention.  She has made steady gains and is currently at supervision level.  She will continue to receive follow-up home therapies after discharge.  [facility]  provide follow up HHPT, HHOT, HHRN and SNA after discharge.   Rehab course: During patient's stay in rehab weekly team conferences were held to monitor patient's progress, set goals and discuss barriers to discharge. At admission, patient required max assist with ADL tasks and mod assist with mobility. She  has had improvement in activity tolerance, balance, postural control, functional use of RUE as well as ability to compensate for deficits. She is able to complete ADL tasks at modified independent to supervision level. She requires supervision with cues for transfers and to ambulate 200' with 4 wheeled walker. Family education has been completed  Discharge disposition: 01-Home or Self Care  Diet: Carb modified.   Special Instructions: No driving, lifting items or strenuous activity 2.  Recommend repeat check of BMET/CBC in 1-2 weeks to monitor renal status as well as H/H.   Discharge Instructions     Ambulatory referral to Physical Medicine Rehab   Complete by: As directed    Moderate complexity follow-up 1 to 2 weeks anterior posterior C1 ring fracture      Allergies as of 06/24/2021       Reactions   Bee Venom Anaphylaxis   Buprenorphine Hcl Hives, Itching   Erythromycin Hives, Itching   Other Anaphylaxis   bees   Penicillins Hives   Has patient had a PCN reaction causing immediate rash, facial/tongue/throat swelling, SOB or lightheadedness with hypotension: No Has  patient had a PCN reaction causing severe rash involving mucus membranes or skin necrosis: No Has patient had a PCN reaction that required hospitalization: No Has patient had a PCN reaction occurring within the last 10 years: No If all of the above answers are "NO", then may proceed with Cephalosporin use.   Hydrocodone Itching   Morphine And Related Hives, Itching   Hives (moderate severity)   Tramadol Hcl Itching   Tramadol Hcl Itching   Hydrocodone-acetaminophen Itching   Pt says it causes hallucinations         Medication List     STOP taking these medications    Accu-Chek FastClix Lancets Misc   B-12 2500 MCG Subl Replaced by: vitamin B-12 1000 MCG tablet   EPINEPHrine 0.3 mg/0.3 mL Soaj injection Commonly known as: EpiPen 2-Pak   furosemide 20 MG tablet Commonly known as: LASIX   Gemtesa 75 MG Tabs Generic drug: Vibegron   hydrALAZINE 25 MG tablet Commonly known as: APRESOLINE   hydrOXYzine 50 MG tablet Commonly known as: ATARAX/VISTARIL   lidocaine 2 % solution Commonly known as: XYLOCAINE   methylPREDNISolone 4 MG Tbpk tablet Commonly known as: MEDROL DOSEPAK   ondansetron 4 MG tablet Commonly known as: ZOFRAN   oxyCODONE-acetaminophen 10-325 MG tablet Commonly known as: PERCOCET   phentermine 37.5 MG capsule   Systane Complete 0.6 % Soln Generic drug: Propylene Glycol   zolpidem 10 MG tablet Commonly known as: AMBIEN       TAKE these medications    acetaminophen 325 MG tablet Commonly known as: TYLENOL Take 1-2 tablets (325-650 mg total) by mouth every 4 (four) hours as needed for mild pain.   acidophilus Caps capsule Take 1 capsule by mouth daily.   albuterol 108 (90 Base) MCG/ACT inhaler Commonly known as: VENTOLIN HFA Inhale 2 puffs into the lungs every 6 (six) hours as needed for wheezing or shortness of breath.   allopurinol 100 MG tablet Commonly known as: ZYLOPRIM Take 1 tablet (100 mg total) by mouth daily.   ALPRAZolam 0.5 MG tablet--Rx # 30 pills Commonly known as: XANAX Take 1 tablet (0.5 mg total) by mouth at bedtime. What changed: when to take this   bismuth subsalicylate 998 PJ/82NK suspension Commonly known as: PEPTO BISMOL Take 30 mLs by mouth every 6 (six) hours as needed for indigestion or diarrhea or loose stools.   Cholecalciferol 125 MCG (5000 UT) Tabs Take 1 tablet (5,000 Units total) by mouth daily.   clotrimazole-betamethasone cream Commonly known as: LOTRISONE Apply 1 application topically 2 (two) times  daily.   Co Q-10 200 MG Caps Take 200 mg by mouth daily.   fluticasone 50 MCG/ACT nasal spray Commonly known as: FLONASE Place 2 sprays into both nostrils in the morning and at bedtime.   fluticasone furoate-vilanterol 100-25 MCG/INH Aepb Commonly known as: BREO ELLIPTA Inhale 1 puff into the lungs at bedtime.   gabapentin 300 MG capsule Commonly known as: NEURONTIN Take 1 capsule (300 mg total) by mouth 2 (two) times daily. What changed: See the new instructions.   levofloxacin 250 MG tablet Commonly known as: LEVAQUIN Take 1 tablet (250 mg total) by mouth daily.   levothyroxine 100 MCG tablet Commonly known as: SYNTHROID Take 1 tablet (100 mcg total) by mouth daily before breakfast.   lidocaine 5 % Commonly known as: LIDODERM Place 2 patches onto the skin daily. Remove & Discard patch within 12 hours or as directed by MD   loratadine 10 MG tablet Commonly known as:  CLARITIN Take 10 mg by mouth daily.   methylphenidate 5 MG tablet--Rx# 60 pills Commonly known as: RITALIN Take 1 tablet (5 mg total) by mouth 2 (two) times daily with a meal.   metoprolol succinate 25 MG 24 hr tablet Commonly known as: TOPROL-XL Take 7 tablets (175 mg total) by mouth daily. What changed:  medication strength how much to take additional instructions   multivitamin with minerals Tabs tablet Take 1 tablet by mouth daily.   Myrbetriq 50 MG Tb24 tablet Generic drug: mirabegron ER Take 1 tablet (50 mg total) by mouth daily.   oxyCODONE 5 MG immediate release tablet--Rx # 30 pills Commonly known as: Oxy IR/ROXICODONE Take 1 tablet (5 mg total) by mouth every 4 (four) hours as needed for severe pain.   pantoprazole 40 MG tablet Commonly known as: PROTONIX Take 1 tablet (40 mg total) by mouth daily.   Polyethyl Glycol-Propyl Glycol 0.4-0.3 % Gel ophthalmic gel Commonly known as: SYSTANE Place 1 application into both eyes at bedtime.   potassium chloride SA 20 MEQ tablet Commonly  known as: KLOR-CON Take 1 tablet (20 mEq total) by mouth daily.   potassium chloride SA 20 MEQ tablet Commonly known as: KLOR-CON Take 1 tablet (20 mEq total) by mouth daily.   rOPINIRole 1 MG tablet Commonly known as: REQUIP Take 1/2 tablet (0.5 mg total) by mouth 3 (three) times daily as needed (cramping). What changed: medication strength   rosuvastatin 10 MG tablet Commonly known as: CRESTOR Take 1 tablet (10 mg total) by mouth daily.   SUMAtriptan 100 MG tablet Commonly known as: IMITREX Take 1 tablet (100 mg total) by mouth every 2 (two) hours as needed for migraine or headache. May repeat in 2 hours if headache persists or recurs. What changed: See the new instructions.   tiZANidine 2 MG tablet Commonly known as: ZANAFLEX Take 1 tablet (2 mg total) by mouth 3 (three) times daily. What changed:  See the new instructions. Another medication with the same name was removed. Continue taking this medication, and follow the directions you see here.   topiramate 25 MG tablet Commonly known as: TOPAMAX Take 1 tablet (25 mg total) by mouth at bedtime.   torsemide 10 MG tablet Commonly known as: DEMADEX Take 1 tablet (10 mg total) by mouth daily. What changed:  medication strength how much to take   traZODone 50 MG tablet Commonly known as: DESYREL Take 0.5-1 tablets (25-50 mg total) by mouth at bedtime as needed for sleep.   Trulicity 1.5 AO/1.3YQ Sopn Generic drug: Dulaglutide Inject 1.5 mg as directed every Sunday. Start taking on: June 29, 2021   vitamin B-12 1000 MCG tablet Commonly known as: CYANOCOBALAMIN Take 2.5 tablets (2,500 mcg total) by mouth daily. Replaces: B-12 2500 MCG Subl        Follow-up Information     Lovorn, Jinny Blossom, MD Follow up.   Specialty: Physical Medicine and Rehabilitation Why: Office to call for appointment Contact information: [address] [place] [address] [phone]         Dawley, Pieter Partridge C, DO  Follow up.   Why: Call for appointment Contact information: [address] [place] [address] [phone]                 Signed: Bary Leriche 06/26/2021, 9:52 AM

## 2021-06-30 DIAGNOSIS — E1129 Type 2 diabetes mellitus with other diabetic kidney complication: Secondary | ICD-10-CM | POA: Diagnosis not present

## 2021-06-30 DIAGNOSIS — R945 Abnormal results of liver function studies: Secondary | ICD-10-CM | POA: Diagnosis not present

## 2021-06-30 DIAGNOSIS — D649 Anemia, unspecified: Secondary | ICD-10-CM | POA: Diagnosis not present

## 2021-06-30 DIAGNOSIS — I129 Hypertensive chronic kidney disease with stage 1 through stage 4 chronic kidney disease, or unspecified chronic kidney disease: Secondary | ICD-10-CM | POA: Diagnosis not present

## 2021-06-30 DIAGNOSIS — N184 Chronic kidney disease, stage 4 (severe): Secondary | ICD-10-CM | POA: Diagnosis not present

## 2021-06-30 DIAGNOSIS — S129XXA Fracture of neck, unspecified, initial encounter: Secondary | ICD-10-CM | POA: Diagnosis not present

## 2021-06-30 DIAGNOSIS — G4700 Insomnia, unspecified: Secondary | ICD-10-CM | POA: Diagnosis not present

## 2021-07-01 DIAGNOSIS — D631 Anemia in chronic kidney disease: Secondary | ICD-10-CM | POA: Diagnosis not present

## 2021-07-01 DIAGNOSIS — N189 Chronic kidney disease, unspecified: Secondary | ICD-10-CM | POA: Diagnosis not present

## 2021-07-01 DIAGNOSIS — S12090K Other displaced fracture of first cervical vertebra, subsequent encounter for fracture with nonunion: Secondary | ICD-10-CM | POA: Diagnosis not present

## 2021-07-01 DIAGNOSIS — E1122 Type 2 diabetes mellitus with diabetic chronic kidney disease: Secondary | ICD-10-CM | POA: Diagnosis not present

## 2021-07-01 DIAGNOSIS — I129 Hypertensive chronic kidney disease with stage 1 through stage 4 chronic kidney disease, or unspecified chronic kidney disease: Secondary | ICD-10-CM | POA: Diagnosis not present

## 2021-07-01 DIAGNOSIS — W19XXXD Unspecified fall, subsequent encounter: Secondary | ICD-10-CM | POA: Diagnosis not present

## 2021-07-04 DIAGNOSIS — S12090K Other displaced fracture of first cervical vertebra, subsequent encounter for fracture with nonunion: Secondary | ICD-10-CM | POA: Diagnosis not present

## 2021-07-04 DIAGNOSIS — N189 Chronic kidney disease, unspecified: Secondary | ICD-10-CM | POA: Diagnosis not present

## 2021-07-04 DIAGNOSIS — I129 Hypertensive chronic kidney disease with stage 1 through stage 4 chronic kidney disease, or unspecified chronic kidney disease: Secondary | ICD-10-CM | POA: Diagnosis not present

## 2021-07-04 DIAGNOSIS — E1122 Type 2 diabetes mellitus with diabetic chronic kidney disease: Secondary | ICD-10-CM | POA: Diagnosis not present

## 2021-07-04 DIAGNOSIS — D631 Anemia in chronic kidney disease: Secondary | ICD-10-CM | POA: Diagnosis not present

## 2021-07-04 DIAGNOSIS — W19XXXD Unspecified fall, subsequent encounter: Secondary | ICD-10-CM | POA: Diagnosis not present

## 2021-07-08 DIAGNOSIS — N184 Chronic kidney disease, stage 4 (severe): Secondary | ICD-10-CM | POA: Diagnosis not present

## 2021-07-08 DIAGNOSIS — R7989 Other specified abnormal findings of blood chemistry: Secondary | ICD-10-CM | POA: Diagnosis not present

## 2021-07-08 DIAGNOSIS — I129 Hypertensive chronic kidney disease with stage 1 through stage 4 chronic kidney disease, or unspecified chronic kidney disease: Secondary | ICD-10-CM | POA: Diagnosis not present

## 2021-07-08 DIAGNOSIS — R5383 Other fatigue: Secondary | ICD-10-CM | POA: Diagnosis not present

## 2021-07-08 DIAGNOSIS — D631 Anemia in chronic kidney disease: Secondary | ICD-10-CM | POA: Diagnosis not present

## 2021-07-08 DIAGNOSIS — B1920 Unspecified viral hepatitis C without hepatic coma: Secondary | ICD-10-CM | POA: Diagnosis not present

## 2021-07-08 DIAGNOSIS — E669 Obesity, unspecified: Secondary | ICD-10-CM | POA: Diagnosis not present

## 2021-07-08 DIAGNOSIS — R600 Localized edema: Secondary | ICD-10-CM | POA: Diagnosis not present

## 2021-07-08 DIAGNOSIS — S12090K Other displaced fracture of first cervical vertebra, subsequent encounter for fracture with nonunion: Secondary | ICD-10-CM | POA: Diagnosis not present

## 2021-07-08 DIAGNOSIS — N189 Chronic kidney disease, unspecified: Secondary | ICD-10-CM | POA: Diagnosis not present

## 2021-07-08 DIAGNOSIS — S12000A Unspecified displaced fracture of first cervical vertebra, initial encounter for closed fracture: Secondary | ICD-10-CM | POA: Diagnosis not present

## 2021-07-08 DIAGNOSIS — N179 Acute kidney failure, unspecified: Secondary | ICD-10-CM | POA: Diagnosis not present

## 2021-07-08 DIAGNOSIS — N390 Urinary tract infection, site not specified: Secondary | ICD-10-CM | POA: Diagnosis not present

## 2021-07-08 DIAGNOSIS — E1122 Type 2 diabetes mellitus with diabetic chronic kidney disease: Secondary | ICD-10-CM | POA: Diagnosis not present

## 2021-07-08 DIAGNOSIS — W19XXXD Unspecified fall, subsequent encounter: Secondary | ICD-10-CM | POA: Diagnosis not present

## 2021-07-08 DIAGNOSIS — E611 Iron deficiency: Secondary | ICD-10-CM | POA: Diagnosis not present

## 2021-07-09 DIAGNOSIS — W19XXXD Unspecified fall, subsequent encounter: Secondary | ICD-10-CM | POA: Diagnosis not present

## 2021-07-09 DIAGNOSIS — I129 Hypertensive chronic kidney disease with stage 1 through stage 4 chronic kidney disease, or unspecified chronic kidney disease: Secondary | ICD-10-CM | POA: Diagnosis not present

## 2021-07-09 DIAGNOSIS — N189 Chronic kidney disease, unspecified: Secondary | ICD-10-CM | POA: Diagnosis not present

## 2021-07-09 DIAGNOSIS — D631 Anemia in chronic kidney disease: Secondary | ICD-10-CM | POA: Diagnosis not present

## 2021-07-09 DIAGNOSIS — E1122 Type 2 diabetes mellitus with diabetic chronic kidney disease: Secondary | ICD-10-CM | POA: Diagnosis not present

## 2021-07-09 DIAGNOSIS — S12090K Other displaced fracture of first cervical vertebra, subsequent encounter for fracture with nonunion: Secondary | ICD-10-CM | POA: Diagnosis not present

## 2021-07-10 DIAGNOSIS — D631 Anemia in chronic kidney disease: Secondary | ICD-10-CM | POA: Diagnosis not present

## 2021-07-10 DIAGNOSIS — S12090K Other displaced fracture of first cervical vertebra, subsequent encounter for fracture with nonunion: Secondary | ICD-10-CM | POA: Diagnosis not present

## 2021-07-10 DIAGNOSIS — N189 Chronic kidney disease, unspecified: Secondary | ICD-10-CM | POA: Diagnosis not present

## 2021-07-10 DIAGNOSIS — E1122 Type 2 diabetes mellitus with diabetic chronic kidney disease: Secondary | ICD-10-CM | POA: Diagnosis not present

## 2021-07-10 DIAGNOSIS — I129 Hypertensive chronic kidney disease with stage 1 through stage 4 chronic kidney disease, or unspecified chronic kidney disease: Secondary | ICD-10-CM | POA: Diagnosis not present

## 2021-07-10 DIAGNOSIS — W19XXXD Unspecified fall, subsequent encounter: Secondary | ICD-10-CM | POA: Diagnosis not present

## 2021-07-11 DIAGNOSIS — E785 Hyperlipidemia, unspecified: Secondary | ICD-10-CM | POA: Diagnosis not present

## 2021-07-11 DIAGNOSIS — N189 Chronic kidney disease, unspecified: Secondary | ICD-10-CM | POA: Diagnosis not present

## 2021-07-11 DIAGNOSIS — S12090K Other displaced fracture of first cervical vertebra, subsequent encounter for fracture with nonunion: Secondary | ICD-10-CM | POA: Diagnosis not present

## 2021-07-11 DIAGNOSIS — E1122 Type 2 diabetes mellitus with diabetic chronic kidney disease: Secondary | ICD-10-CM | POA: Diagnosis not present

## 2021-07-11 DIAGNOSIS — N184 Chronic kidney disease, stage 4 (severe): Secondary | ICD-10-CM | POA: Diagnosis not present

## 2021-07-11 DIAGNOSIS — W19XXXD Unspecified fall, subsequent encounter: Secondary | ICD-10-CM | POA: Diagnosis not present

## 2021-07-11 DIAGNOSIS — I129 Hypertensive chronic kidney disease with stage 1 through stage 4 chronic kidney disease, or unspecified chronic kidney disease: Secondary | ICD-10-CM | POA: Diagnosis not present

## 2021-07-11 DIAGNOSIS — D631 Anemia in chronic kidney disease: Secondary | ICD-10-CM | POA: Diagnosis not present

## 2021-07-14 DIAGNOSIS — N189 Chronic kidney disease, unspecified: Secondary | ICD-10-CM | POA: Diagnosis not present

## 2021-07-14 DIAGNOSIS — E1122 Type 2 diabetes mellitus with diabetic chronic kidney disease: Secondary | ICD-10-CM | POA: Diagnosis not present

## 2021-07-14 DIAGNOSIS — I129 Hypertensive chronic kidney disease with stage 1 through stage 4 chronic kidney disease, or unspecified chronic kidney disease: Secondary | ICD-10-CM | POA: Diagnosis not present

## 2021-07-14 DIAGNOSIS — W19XXXD Unspecified fall, subsequent encounter: Secondary | ICD-10-CM | POA: Diagnosis not present

## 2021-07-14 DIAGNOSIS — S12090K Other displaced fracture of first cervical vertebra, subsequent encounter for fracture with nonunion: Secondary | ICD-10-CM | POA: Diagnosis not present

## 2021-07-14 DIAGNOSIS — D631 Anemia in chronic kidney disease: Secondary | ICD-10-CM | POA: Diagnosis not present

## 2021-07-15 DIAGNOSIS — D631 Anemia in chronic kidney disease: Secondary | ICD-10-CM | POA: Diagnosis not present

## 2021-07-15 DIAGNOSIS — N189 Chronic kidney disease, unspecified: Secondary | ICD-10-CM | POA: Diagnosis not present

## 2021-07-15 DIAGNOSIS — I129 Hypertensive chronic kidney disease with stage 1 through stage 4 chronic kidney disease, or unspecified chronic kidney disease: Secondary | ICD-10-CM | POA: Diagnosis not present

## 2021-07-15 DIAGNOSIS — S12090K Other displaced fracture of first cervical vertebra, subsequent encounter for fracture with nonunion: Secondary | ICD-10-CM | POA: Diagnosis not present

## 2021-07-15 DIAGNOSIS — W19XXXD Unspecified fall, subsequent encounter: Secondary | ICD-10-CM | POA: Diagnosis not present

## 2021-07-15 DIAGNOSIS — E1122 Type 2 diabetes mellitus with diabetic chronic kidney disease: Secondary | ICD-10-CM | POA: Diagnosis not present

## 2021-07-17 DIAGNOSIS — N189 Chronic kidney disease, unspecified: Secondary | ICD-10-CM | POA: Diagnosis not present

## 2021-07-17 DIAGNOSIS — D631 Anemia in chronic kidney disease: Secondary | ICD-10-CM | POA: Diagnosis not present

## 2021-07-17 DIAGNOSIS — S12090K Other displaced fracture of first cervical vertebra, subsequent encounter for fracture with nonunion: Secondary | ICD-10-CM | POA: Diagnosis not present

## 2021-07-17 DIAGNOSIS — W19XXXD Unspecified fall, subsequent encounter: Secondary | ICD-10-CM | POA: Diagnosis not present

## 2021-07-17 DIAGNOSIS — I129 Hypertensive chronic kidney disease with stage 1 through stage 4 chronic kidney disease, or unspecified chronic kidney disease: Secondary | ICD-10-CM | POA: Diagnosis not present

## 2021-07-17 DIAGNOSIS — E1122 Type 2 diabetes mellitus with diabetic chronic kidney disease: Secondary | ICD-10-CM | POA: Diagnosis not present

## 2021-07-21 ENCOUNTER — Encounter: Payer: Medicare Other | Admitting: Physical Medicine and Rehabilitation

## 2021-07-22 DIAGNOSIS — N189 Chronic kidney disease, unspecified: Secondary | ICD-10-CM | POA: Diagnosis not present

## 2021-07-22 DIAGNOSIS — W19XXXD Unspecified fall, subsequent encounter: Secondary | ICD-10-CM | POA: Diagnosis not present

## 2021-07-22 DIAGNOSIS — S12090K Other displaced fracture of first cervical vertebra, subsequent encounter for fracture with nonunion: Secondary | ICD-10-CM | POA: Diagnosis not present

## 2021-07-22 DIAGNOSIS — E1122 Type 2 diabetes mellitus with diabetic chronic kidney disease: Secondary | ICD-10-CM | POA: Diagnosis not present

## 2021-07-22 DIAGNOSIS — I129 Hypertensive chronic kidney disease with stage 1 through stage 4 chronic kidney disease, or unspecified chronic kidney disease: Secondary | ICD-10-CM | POA: Diagnosis not present

## 2021-07-22 DIAGNOSIS — D631 Anemia in chronic kidney disease: Secondary | ICD-10-CM | POA: Diagnosis not present

## 2021-07-24 DIAGNOSIS — N189 Chronic kidney disease, unspecified: Secondary | ICD-10-CM | POA: Diagnosis not present

## 2021-07-24 DIAGNOSIS — E1122 Type 2 diabetes mellitus with diabetic chronic kidney disease: Secondary | ICD-10-CM | POA: Diagnosis not present

## 2021-07-24 DIAGNOSIS — D631 Anemia in chronic kidney disease: Secondary | ICD-10-CM | POA: Diagnosis not present

## 2021-07-24 DIAGNOSIS — I129 Hypertensive chronic kidney disease with stage 1 through stage 4 chronic kidney disease, or unspecified chronic kidney disease: Secondary | ICD-10-CM | POA: Diagnosis not present

## 2021-07-24 DIAGNOSIS — W19XXXD Unspecified fall, subsequent encounter: Secondary | ICD-10-CM | POA: Diagnosis not present

## 2021-07-24 DIAGNOSIS — S12090K Other displaced fracture of first cervical vertebra, subsequent encounter for fracture with nonunion: Secondary | ICD-10-CM | POA: Diagnosis not present

## 2021-07-25 DIAGNOSIS — J45909 Unspecified asthma, uncomplicated: Secondary | ICD-10-CM | POA: Diagnosis not present

## 2021-07-25 DIAGNOSIS — Z9181 History of falling: Secondary | ICD-10-CM | POA: Diagnosis not present

## 2021-07-25 DIAGNOSIS — S12090K Other displaced fracture of first cervical vertebra, subsequent encounter for fracture with nonunion: Secondary | ICD-10-CM | POA: Diagnosis not present

## 2021-07-25 DIAGNOSIS — Z87440 Personal history of urinary (tract) infections: Secondary | ICD-10-CM | POA: Diagnosis not present

## 2021-07-25 DIAGNOSIS — N189 Chronic kidney disease, unspecified: Secondary | ICD-10-CM | POA: Diagnosis not present

## 2021-07-25 DIAGNOSIS — K7460 Unspecified cirrhosis of liver: Secondary | ICD-10-CM | POA: Diagnosis not present

## 2021-07-25 DIAGNOSIS — N319 Neuromuscular dysfunction of bladder, unspecified: Secondary | ICD-10-CM | POA: Diagnosis not present

## 2021-07-25 DIAGNOSIS — Z981 Arthrodesis status: Secondary | ICD-10-CM | POA: Diagnosis not present

## 2021-07-25 DIAGNOSIS — B1920 Unspecified viral hepatitis C without hepatic coma: Secondary | ICD-10-CM | POA: Diagnosis not present

## 2021-07-25 DIAGNOSIS — I129 Hypertensive chronic kidney disease with stage 1 through stage 4 chronic kidney disease, or unspecified chronic kidney disease: Secondary | ICD-10-CM | POA: Diagnosis not present

## 2021-07-25 DIAGNOSIS — Z87891 Personal history of nicotine dependence: Secondary | ICD-10-CM | POA: Diagnosis not present

## 2021-07-25 DIAGNOSIS — K592 Neurogenic bowel, not elsewhere classified: Secondary | ICD-10-CM | POA: Diagnosis not present

## 2021-07-25 DIAGNOSIS — F410 Panic disorder [episodic paroxysmal anxiety] without agoraphobia: Secondary | ICD-10-CM | POA: Diagnosis not present

## 2021-07-25 DIAGNOSIS — Z79899 Other long term (current) drug therapy: Secondary | ICD-10-CM | POA: Diagnosis not present

## 2021-07-25 DIAGNOSIS — D631 Anemia in chronic kidney disease: Secondary | ICD-10-CM | POA: Diagnosis not present

## 2021-07-25 DIAGNOSIS — Z79891 Long term (current) use of opiate analgesic: Secondary | ICD-10-CM | POA: Diagnosis not present

## 2021-07-25 DIAGNOSIS — K219 Gastro-esophageal reflux disease without esophagitis: Secondary | ICD-10-CM | POA: Diagnosis not present

## 2021-07-25 DIAGNOSIS — I051 Rheumatic mitral insufficiency: Secondary | ICD-10-CM | POA: Diagnosis not present

## 2021-07-25 DIAGNOSIS — E1122 Type 2 diabetes mellitus with diabetic chronic kidney disease: Secondary | ICD-10-CM | POA: Diagnosis not present

## 2021-07-25 DIAGNOSIS — Z7951 Long term (current) use of inhaled steroids: Secondary | ICD-10-CM | POA: Diagnosis not present

## 2021-07-25 DIAGNOSIS — G43909 Migraine, unspecified, not intractable, without status migrainosus: Secondary | ICD-10-CM | POA: Diagnosis not present

## 2021-07-25 DIAGNOSIS — M1990 Unspecified osteoarthritis, unspecified site: Secondary | ICD-10-CM | POA: Diagnosis not present

## 2021-07-25 DIAGNOSIS — W19XXXD Unspecified fall, subsequent encounter: Secondary | ICD-10-CM | POA: Diagnosis not present

## 2021-07-29 DIAGNOSIS — I129 Hypertensive chronic kidney disease with stage 1 through stage 4 chronic kidney disease, or unspecified chronic kidney disease: Secondary | ICD-10-CM | POA: Diagnosis not present

## 2021-07-29 DIAGNOSIS — N189 Chronic kidney disease, unspecified: Secondary | ICD-10-CM | POA: Diagnosis not present

## 2021-07-29 DIAGNOSIS — D631 Anemia in chronic kidney disease: Secondary | ICD-10-CM | POA: Diagnosis not present

## 2021-07-29 DIAGNOSIS — E1122 Type 2 diabetes mellitus with diabetic chronic kidney disease: Secondary | ICD-10-CM | POA: Diagnosis not present

## 2021-07-29 DIAGNOSIS — W19XXXD Unspecified fall, subsequent encounter: Secondary | ICD-10-CM | POA: Diagnosis not present

## 2021-07-29 DIAGNOSIS — S12090K Other displaced fracture of first cervical vertebra, subsequent encounter for fracture with nonunion: Secondary | ICD-10-CM | POA: Diagnosis not present

## 2021-07-30 DIAGNOSIS — S12090K Other displaced fracture of first cervical vertebra, subsequent encounter for fracture with nonunion: Secondary | ICD-10-CM | POA: Diagnosis not present

## 2021-07-30 DIAGNOSIS — N189 Chronic kidney disease, unspecified: Secondary | ICD-10-CM | POA: Diagnosis not present

## 2021-07-30 DIAGNOSIS — D631 Anemia in chronic kidney disease: Secondary | ICD-10-CM | POA: Diagnosis not present

## 2021-07-30 DIAGNOSIS — I129 Hypertensive chronic kidney disease with stage 1 through stage 4 chronic kidney disease, or unspecified chronic kidney disease: Secondary | ICD-10-CM | POA: Diagnosis not present

## 2021-07-30 DIAGNOSIS — E1122 Type 2 diabetes mellitus with diabetic chronic kidney disease: Secondary | ICD-10-CM | POA: Diagnosis not present

## 2021-07-30 DIAGNOSIS — W19XXXD Unspecified fall, subsequent encounter: Secondary | ICD-10-CM | POA: Diagnosis not present

## 2021-08-01 ENCOUNTER — Other Ambulatory Visit: Payer: Self-pay

## 2021-08-01 ENCOUNTER — Encounter: Payer: Medicare Other | Attending: Physical Medicine and Rehabilitation | Admitting: Registered Nurse

## 2021-08-01 ENCOUNTER — Encounter: Payer: Medicare Other | Admitting: Physical Medicine and Rehabilitation

## 2021-08-01 VITALS — BP 120/77 | HR 84 | Ht 64.0 in | Wt 217.8 lb

## 2021-08-01 DIAGNOSIS — W19XXXD Unspecified fall, subsequent encounter: Secondary | ICD-10-CM | POA: Diagnosis not present

## 2021-08-01 DIAGNOSIS — S12001D Unspecified nondisplaced fracture of first cervical vertebra, subsequent encounter for fracture with routine healing: Secondary | ICD-10-CM | POA: Diagnosis not present

## 2021-08-01 DIAGNOSIS — S12090K Other displaced fracture of first cervical vertebra, subsequent encounter for fracture with nonunion: Secondary | ICD-10-CM | POA: Diagnosis not present

## 2021-08-01 DIAGNOSIS — M62838 Other muscle spasm: Secondary | ICD-10-CM | POA: Insufficient documentation

## 2021-08-01 DIAGNOSIS — N189 Chronic kidney disease, unspecified: Secondary | ICD-10-CM | POA: Diagnosis not present

## 2021-08-01 DIAGNOSIS — D631 Anemia in chronic kidney disease: Secondary | ICD-10-CM | POA: Diagnosis not present

## 2021-08-01 DIAGNOSIS — E1122 Type 2 diabetes mellitus with diabetic chronic kidney disease: Secondary | ICD-10-CM | POA: Diagnosis not present

## 2021-08-01 DIAGNOSIS — I129 Hypertensive chronic kidney disease with stage 1 through stage 4 chronic kidney disease, or unspecified chronic kidney disease: Secondary | ICD-10-CM | POA: Diagnosis not present

## 2021-08-01 DIAGNOSIS — I10 Essential (primary) hypertension: Secondary | ICD-10-CM | POA: Insufficient documentation

## 2021-08-01 NOTE — Progress Notes (Signed)
Subjective:    Patient ID: Becky Hobbs, female    DOB: 12/22/1945, 75 y.o.   MRN: [id number]  HPI: Becky Hobbs is a 75 y.o. female who is here for [facility] appointment for follow up of her Fracture of C1 vertebra, closed. She was admitted on 06/05/2021 for see below by Dr Dawley and Dr Vertell Limber . OCCIPITAL- CERVICAL TWO INSTRUMENTATION AND FUSION; EXTENSION TO CERVICAL FIVE N/A General  APPLICATION OF INTRAOPERATIVE CT SCAN     Dr Dawley HPI:  Chief Complaint: Neck pain HPI: This is a 75 year old female with a history of a fall downstairs was found to have an anterior and posterior ring C1 fracture.  He was treated treated conservatively with a collar and follow-up with imaging.  On progressive imaging the lateral listhesis on the right of C1 on C2 continue to progress and she has severe occipital headaches especially on the right side as well as right-sided neck pain.  Given her progressive lateral listhesis, anterior and posterior ring fracture, we discussed surgical intervention in the form of occipital cervical fusion.  We discussed all risks benefits and expected outcomes and she agreed to proceed.  She denies any new symptoms such as numbness, tingling or weakness.  CT Head: WO Contrast:  IMPRESSION: 1. No acute intracranial abnormality. 2. Normal aging brain. 3. Small right parietal scalp hematoma.  She was admitted to inpatient Rehabilitation on 06/09/2021 and discharged home on 06/24/2021. She is receiving home health with [facility].  She states she has pain in her neck and bilateral shoulders. She rates her pain 7. She is wearing her Cervical Collar.       Pain Inventory Average Pain 6 Pain Right Now 7 My pain is stabbing and aching  In the last 24 hours, has pain interfered with the following? General activity 7 Relation with others 0 Enjoyment of life 2 What TIME of day is your pain at its worst? morning  and daytime Sleep (in general) Poor  Pain is worse  with: walking and standing Pain improves with: rest, heat/ice, therapy/exercise, pacing activities, and medication Relief from Meds:  no ans  walk with assistance use a cane use a walker how many minutes can you walk? 5-7 ability to climb steps?  yes do you drive?  no  retired I need assistance with the following:  dressing, bathing, meal prep, household duties, and shopping Do you have any goals in this area?  yes  bladder control problems weakness numbness tingling trouble walking spasms  Any changes since last visit?  no  Any changes since last visit?  no Primary care Saw Dr Brigitte Pulse 06/30/21 Dr Shary Decamp renal MD, Saw him 9//27/22    Family History  Problem Relation Age of Onset   Kidney disease Mother    Vascular Disease Mother    Social History   Socioeconomic History   Marital status: Widowed    Spouse name: Not on file   Number of children: 3   Years of education: college   Highest education level: Not on file  Occupational History   Occupation: retired    Comment: Retired  Tobacco Use   Smoking status: Former    Types: Cigarettes   Smokeless tobacco: Never  Scientific laboratory technician Use: Never used  Substance and Sexual Activity   Alcohol use: No   Drug use: No   Sexual activity: Never  Other Topics Concern   Not on file  Social History Narrative   Patient  has a high school education.    Patient is widow.   Patient is retired.   Social Determinants of Health   Financial Resource Strain: Not on file  Food Insecurity: Not on file  Transportation Needs: Not on file  Physical Activity: Not on file  Stress: Not on file  Social Connections: Not on file   Past Surgical History:  Procedure Laterality Date   ABDOMINAL HYSTERECTOMY     APPENDECTOMY     APPLICATION OF INTRAOPERATIVE CT SCAN N/A 06/05/2021   Procedure: APPLICATION OF INTRAOPERATIVE CT SCAN;  Surgeon: Dawley, Theodoro Doing, DO;  Location: [facility];  Service: Neurosurgery;  Laterality: N/A;   BACK  SURGERY     bladder tack     BREAST CYST EXCISION     BREAST CYST INCISION AND DRAINAGE     BREAST SURGERY     BUNIONECTOMY     CHOLECYSTECTOMY     DILATION AND CURETTAGE OF UTERUS     ESOPHAGOGASTRODUODENOSCOPY (EGD) WITH PROPOFOL N/A 01/03/2019   Procedure: ESOPHAGOGASTRODUODENOSCOPY (EGD) WITH PROPOFOL;  Surgeon: Otis Brace, MD;  Location: [facility];  Service: Gastroenterology;  Laterality: N/A;   FOREIGN BODY REMOVAL  01/03/2019   Procedure: FOREIGN BODY REMOVAL;  Surgeon: Otis Brace, MD;  Location: [facility];  Service: Gastroenterology;;   FRACTURE SURGERY     left wrist   HERNIA REPAIR     hiatal hernia   LUMBAR FUSION  07/26/2017   LUMBAR LAMINECTOMY  03/04/2012   Procedure: MICRODISCECTOMY LUMBAR LAMINECTOMY;  Surgeon: Jessy Oto, MD;  Location: [facility];  Service: Orthopedics;  Laterality: N/A;  L3-4 central laminectomy   NASAL SEPTUM SURGERY     nissan fundoplication     OVARIAN CYST SURGERY     POSTERIOR CERVICAL FUSION/FORAMINOTOMY N/A 06/05/2021   Procedure: OCCIPITAL- CERVICAL TWO INSTRUMENTATION AND FUSION; EXTENSION TO CERVICAL FIVE;  Surgeon: Karsten Ro, DO;  Location: [facility];  Service: Neurosurgery;  Laterality: N/A;   THORACIC FUSION  07/26/2017   TONSILLECTOMY     Past Medical History:  Diagnosis Date   Anemia    Anxiety    panic attacks   Arthritis    Asthma    Blood transfusion    1973--with twins birth   Bronchitis    Chronic kidney disease    Diabetes mellitus    borderline   GERD (gastroesophageal reflux disease)    H/O hiatal hernia    surgery fixed   Headache    migraines   Heart murmur    MVP   Hepatitis    Hep C   Hypertension    Hypothyroidism    Pneumonia    BP 120/77   Pulse 84   Ht 5\' 4"  (1.626 m)   Wt 217 lb 12.8 oz (98.8 kg)   SpO2 97%   BMI 37.39 kg/m   Opioid Risk Score:   Fall Risk Score:  `1  Depression screen PHQ 2/9  Depression screen PHQ 2/9 08/01/2021  Decreased Interest 0  Down,  Depressed, Hopeless 0  PHQ - 2 Score 0  Altered sleeping 3  Tired, decreased energy 3  Change in appetite 1  Feeling bad or failure about yourself  0  Trouble concentrating 0  Moving slowly or fidgety/restless 0  Suicidal thoughts 0  PHQ-9 Score 7     Review of Systems  Constitutional:  Positive for appetite change.       Poor  HENT: Negative.    Eyes: Negative.   Respiratory: Negative.  Cardiovascular: Negative.   Gastrointestinal: Negative.   Endocrine: Negative.   Genitourinary:        Bladder control  Musculoskeletal:  Positive for gait problem and neck pain.  Skin: Negative.   Allergic/Immunologic: Negative.   Neurological:  Positive for weakness and numbness.       Tingling  Hematological: Negative.   Psychiatric/Behavioral: Negative.    All other systems reviewed and are negative.     Objective:   Physical Exam Vitals and nursing note reviewed.  Cardiovascular:     Rate and Rhythm: Normal rate and regular rhythm.     Pulses: Normal pulses.     Heart sounds: Normal heart sounds.  Pulmonary:     Effort: Pulmonary effort is normal.     Breath sounds: Normal breath sounds.  Musculoskeletal:     Cervical back: Normal range of motion and neck supple.     Comments: Normal Muscle Bulk and Muscle Testing Reveals:  Upper Extremities: Decreased ROM 90 Degrees and and Muscle Strength  4/5  Lower Extremities: Full ROM and Muscle Strength 5/5 Arises from Table slowly using walker for support Narrow Based  Gait     Skin:    General: Skin is warm and dry.  Neurological:     Mental Status: She is alert and oriented to person, place, and time.  Psychiatric:        Mood and Affect: Mood normal.        Behavior: Behavior normal.          Assessment & Plan:  Fracture of C1 vertebra, Closed: Continue Home Health Therapy. Neurosurgery Following. Continue to Monitor.  Muscle Spasm: Continue Tizanidine. Continue to Monitor.  Essential Hypertension: Continue current  medication regimen. PCP Following. Continue to monitor.   F/U with Dr Dagoberto Ligas in 4- 6 weeks

## 2021-08-04 ENCOUNTER — Encounter: Payer: Self-pay | Admitting: Registered Nurse

## 2021-08-05 DIAGNOSIS — D631 Anemia in chronic kidney disease: Secondary | ICD-10-CM | POA: Diagnosis not present

## 2021-08-05 DIAGNOSIS — N189 Chronic kidney disease, unspecified: Secondary | ICD-10-CM | POA: Diagnosis not present

## 2021-08-05 DIAGNOSIS — I129 Hypertensive chronic kidney disease with stage 1 through stage 4 chronic kidney disease, or unspecified chronic kidney disease: Secondary | ICD-10-CM | POA: Diagnosis not present

## 2021-08-05 DIAGNOSIS — W19XXXD Unspecified fall, subsequent encounter: Secondary | ICD-10-CM | POA: Diagnosis not present

## 2021-08-05 DIAGNOSIS — S12090K Other displaced fracture of first cervical vertebra, subsequent encounter for fracture with nonunion: Secondary | ICD-10-CM | POA: Diagnosis not present

## 2021-08-05 DIAGNOSIS — E1122 Type 2 diabetes mellitus with diabetic chronic kidney disease: Secondary | ICD-10-CM | POA: Diagnosis not present

## 2021-08-07 DIAGNOSIS — D631 Anemia in chronic kidney disease: Secondary | ICD-10-CM | POA: Diagnosis not present

## 2021-08-07 DIAGNOSIS — E1122 Type 2 diabetes mellitus with diabetic chronic kidney disease: Secondary | ICD-10-CM | POA: Diagnosis not present

## 2021-08-07 DIAGNOSIS — N189 Chronic kidney disease, unspecified: Secondary | ICD-10-CM | POA: Diagnosis not present

## 2021-08-07 DIAGNOSIS — W19XXXD Unspecified fall, subsequent encounter: Secondary | ICD-10-CM | POA: Diagnosis not present

## 2021-08-07 DIAGNOSIS — I129 Hypertensive chronic kidney disease with stage 1 through stage 4 chronic kidney disease, or unspecified chronic kidney disease: Secondary | ICD-10-CM | POA: Diagnosis not present

## 2021-08-07 DIAGNOSIS — S12090K Other displaced fracture of first cervical vertebra, subsequent encounter for fracture with nonunion: Secondary | ICD-10-CM | POA: Diagnosis not present

## 2021-08-08 DIAGNOSIS — D509 Iron deficiency anemia, unspecified: Secondary | ICD-10-CM | POA: Diagnosis not present

## 2021-08-08 DIAGNOSIS — N183 Chronic kidney disease, stage 3 unspecified: Secondary | ICD-10-CM | POA: Diagnosis not present

## 2021-08-11 DIAGNOSIS — E785 Hyperlipidemia, unspecified: Secondary | ICD-10-CM | POA: Diagnosis not present

## 2021-08-11 DIAGNOSIS — I129 Hypertensive chronic kidney disease with stage 1 through stage 4 chronic kidney disease, or unspecified chronic kidney disease: Secondary | ICD-10-CM | POA: Diagnosis not present

## 2021-08-11 DIAGNOSIS — N184 Chronic kidney disease, stage 4 (severe): Secondary | ICD-10-CM | POA: Diagnosis not present

## 2021-08-11 DIAGNOSIS — E1122 Type 2 diabetes mellitus with diabetic chronic kidney disease: Secondary | ICD-10-CM | POA: Diagnosis not present

## 2021-08-12 DIAGNOSIS — W19XXXD Unspecified fall, subsequent encounter: Secondary | ICD-10-CM | POA: Diagnosis not present

## 2021-08-12 DIAGNOSIS — E1122 Type 2 diabetes mellitus with diabetic chronic kidney disease: Secondary | ICD-10-CM | POA: Diagnosis not present

## 2021-08-12 DIAGNOSIS — I129 Hypertensive chronic kidney disease with stage 1 through stage 4 chronic kidney disease, or unspecified chronic kidney disease: Secondary | ICD-10-CM | POA: Diagnosis not present

## 2021-08-12 DIAGNOSIS — N189 Chronic kidney disease, unspecified: Secondary | ICD-10-CM | POA: Diagnosis not present

## 2021-08-12 DIAGNOSIS — K7469 Other cirrhosis of liver: Secondary | ICD-10-CM | POA: Diagnosis not present

## 2021-08-12 DIAGNOSIS — D631 Anemia in chronic kidney disease: Secondary | ICD-10-CM | POA: Diagnosis not present

## 2021-08-12 DIAGNOSIS — S12090K Other displaced fracture of first cervical vertebra, subsequent encounter for fracture with nonunion: Secondary | ICD-10-CM | POA: Diagnosis not present

## 2021-08-13 ENCOUNTER — Other Ambulatory Visit: Payer: Self-pay | Admitting: Nurse Practitioner

## 2021-08-13 DIAGNOSIS — K7469 Other cirrhosis of liver: Secondary | ICD-10-CM

## 2021-08-14 DIAGNOSIS — I129 Hypertensive chronic kidney disease with stage 1 through stage 4 chronic kidney disease, or unspecified chronic kidney disease: Secondary | ICD-10-CM | POA: Diagnosis not present

## 2021-08-14 DIAGNOSIS — W19XXXD Unspecified fall, subsequent encounter: Secondary | ICD-10-CM | POA: Diagnosis not present

## 2021-08-14 DIAGNOSIS — E1122 Type 2 diabetes mellitus with diabetic chronic kidney disease: Secondary | ICD-10-CM | POA: Diagnosis not present

## 2021-08-14 DIAGNOSIS — S12090K Other displaced fracture of first cervical vertebra, subsequent encounter for fracture with nonunion: Secondary | ICD-10-CM | POA: Diagnosis not present

## 2021-08-14 DIAGNOSIS — D631 Anemia in chronic kidney disease: Secondary | ICD-10-CM | POA: Diagnosis not present

## 2021-08-14 DIAGNOSIS — N189 Chronic kidney disease, unspecified: Secondary | ICD-10-CM | POA: Diagnosis not present

## 2021-08-15 DIAGNOSIS — D509 Iron deficiency anemia, unspecified: Secondary | ICD-10-CM | POA: Diagnosis not present

## 2021-08-15 DIAGNOSIS — N183 Chronic kidney disease, stage 3 unspecified: Secondary | ICD-10-CM | POA: Diagnosis not present

## 2021-08-19 DIAGNOSIS — I129 Hypertensive chronic kidney disease with stage 1 through stage 4 chronic kidney disease, or unspecified chronic kidney disease: Secondary | ICD-10-CM | POA: Diagnosis not present

## 2021-08-19 DIAGNOSIS — D631 Anemia in chronic kidney disease: Secondary | ICD-10-CM | POA: Diagnosis not present

## 2021-08-19 DIAGNOSIS — W19XXXD Unspecified fall, subsequent encounter: Secondary | ICD-10-CM | POA: Diagnosis not present

## 2021-08-19 DIAGNOSIS — N189 Chronic kidney disease, unspecified: Secondary | ICD-10-CM | POA: Diagnosis not present

## 2021-08-19 DIAGNOSIS — S12090K Other displaced fracture of first cervical vertebra, subsequent encounter for fracture with nonunion: Secondary | ICD-10-CM | POA: Diagnosis not present

## 2021-08-19 DIAGNOSIS — E1122 Type 2 diabetes mellitus with diabetic chronic kidney disease: Secondary | ICD-10-CM | POA: Diagnosis not present

## 2021-08-21 DIAGNOSIS — S12090K Other displaced fracture of first cervical vertebra, subsequent encounter for fracture with nonunion: Secondary | ICD-10-CM | POA: Diagnosis not present

## 2021-08-21 DIAGNOSIS — E1122 Type 2 diabetes mellitus with diabetic chronic kidney disease: Secondary | ICD-10-CM | POA: Diagnosis not present

## 2021-08-21 DIAGNOSIS — N189 Chronic kidney disease, unspecified: Secondary | ICD-10-CM | POA: Diagnosis not present

## 2021-08-21 DIAGNOSIS — D631 Anemia in chronic kidney disease: Secondary | ICD-10-CM | POA: Diagnosis not present

## 2021-08-21 DIAGNOSIS — W19XXXD Unspecified fall, subsequent encounter: Secondary | ICD-10-CM | POA: Diagnosis not present

## 2021-08-21 DIAGNOSIS — I129 Hypertensive chronic kidney disease with stage 1 through stage 4 chronic kidney disease, or unspecified chronic kidney disease: Secondary | ICD-10-CM | POA: Diagnosis not present

## 2021-08-24 DIAGNOSIS — K219 Gastro-esophageal reflux disease without esophagitis: Secondary | ICD-10-CM | POA: Diagnosis not present

## 2021-08-24 DIAGNOSIS — W19XXXD Unspecified fall, subsequent encounter: Secondary | ICD-10-CM | POA: Diagnosis not present

## 2021-08-24 DIAGNOSIS — E1122 Type 2 diabetes mellitus with diabetic chronic kidney disease: Secondary | ICD-10-CM | POA: Diagnosis not present

## 2021-08-24 DIAGNOSIS — K7460 Unspecified cirrhosis of liver: Secondary | ICD-10-CM | POA: Diagnosis not present

## 2021-08-24 DIAGNOSIS — I051 Rheumatic mitral insufficiency: Secondary | ICD-10-CM | POA: Diagnosis not present

## 2021-08-24 DIAGNOSIS — G43909 Migraine, unspecified, not intractable, without status migrainosus: Secondary | ICD-10-CM | POA: Diagnosis not present

## 2021-08-24 DIAGNOSIS — J45909 Unspecified asthma, uncomplicated: Secondary | ICD-10-CM | POA: Diagnosis not present

## 2021-08-24 DIAGNOSIS — I129 Hypertensive chronic kidney disease with stage 1 through stage 4 chronic kidney disease, or unspecified chronic kidney disease: Secondary | ICD-10-CM | POA: Diagnosis not present

## 2021-08-24 DIAGNOSIS — S12090K Other displaced fracture of first cervical vertebra, subsequent encounter for fracture with nonunion: Secondary | ICD-10-CM | POA: Diagnosis not present

## 2021-08-24 DIAGNOSIS — M1990 Unspecified osteoarthritis, unspecified site: Secondary | ICD-10-CM | POA: Diagnosis not present

## 2021-08-24 DIAGNOSIS — N189 Chronic kidney disease, unspecified: Secondary | ICD-10-CM | POA: Diagnosis not present

## 2021-08-24 DIAGNOSIS — D631 Anemia in chronic kidney disease: Secondary | ICD-10-CM | POA: Diagnosis not present

## 2021-08-25 DIAGNOSIS — E039 Hypothyroidism, unspecified: Secondary | ICD-10-CM | POA: Diagnosis not present

## 2021-08-25 DIAGNOSIS — K7460 Unspecified cirrhosis of liver: Secondary | ICD-10-CM | POA: Diagnosis not present

## 2021-08-25 DIAGNOSIS — I129 Hypertensive chronic kidney disease with stage 1 through stage 4 chronic kidney disease, or unspecified chronic kidney disease: Secondary | ICD-10-CM | POA: Diagnosis not present

## 2021-08-25 DIAGNOSIS — K219 Gastro-esophageal reflux disease without esophagitis: Secondary | ICD-10-CM | POA: Diagnosis not present

## 2021-08-25 DIAGNOSIS — B1920 Unspecified viral hepatitis C without hepatic coma: Secondary | ICD-10-CM | POA: Diagnosis not present

## 2021-08-25 DIAGNOSIS — G43909 Migraine, unspecified, not intractable, without status migrainosus: Secondary | ICD-10-CM | POA: Diagnosis not present

## 2021-08-25 DIAGNOSIS — E669 Obesity, unspecified: Secondary | ICD-10-CM | POA: Diagnosis not present

## 2021-08-25 DIAGNOSIS — N184 Chronic kidney disease, stage 4 (severe): Secondary | ICD-10-CM | POA: Diagnosis not present

## 2021-08-25 DIAGNOSIS — E785 Hyperlipidemia, unspecified: Secondary | ICD-10-CM | POA: Diagnosis not present

## 2021-08-25 DIAGNOSIS — E1129 Type 2 diabetes mellitus with other diabetic kidney complication: Secondary | ICD-10-CM | POA: Diagnosis not present

## 2021-08-25 DIAGNOSIS — G629 Polyneuropathy, unspecified: Secondary | ICD-10-CM | POA: Diagnosis not present

## 2021-08-27 DIAGNOSIS — Z23 Encounter for immunization: Secondary | ICD-10-CM | POA: Diagnosis not present

## 2021-09-01 ENCOUNTER — Ambulatory Visit
Admission: RE | Admit: 2021-09-01 | Discharge: 2021-09-01 | Disposition: A | Discharge status: Home / Self Care | Payer: Medicare Other | Source: Ambulatory Visit | Attending: Nurse Practitioner | Admitting: Nurse Practitioner

## 2021-09-01 DIAGNOSIS — N184 Chronic kidney disease, stage 4 (severe): Secondary | ICD-10-CM | POA: Diagnosis not present

## 2021-09-01 DIAGNOSIS — N390 Urinary tract infection, site not specified: Secondary | ICD-10-CM | POA: Diagnosis not present

## 2021-09-01 DIAGNOSIS — K7460 Unspecified cirrhosis of liver: Secondary | ICD-10-CM | POA: Diagnosis not present

## 2021-09-01 DIAGNOSIS — K7469 Other cirrhosis of liver: Secondary | ICD-10-CM

## 2021-09-10 DIAGNOSIS — I129 Hypertensive chronic kidney disease with stage 1 through stage 4 chronic kidney disease, or unspecified chronic kidney disease: Secondary | ICD-10-CM | POA: Diagnosis not present

## 2021-09-10 DIAGNOSIS — E1122 Type 2 diabetes mellitus with diabetic chronic kidney disease: Secondary | ICD-10-CM | POA: Diagnosis not present

## 2021-09-10 DIAGNOSIS — E785 Hyperlipidemia, unspecified: Secondary | ICD-10-CM | POA: Diagnosis not present

## 2021-09-10 DIAGNOSIS — N184 Chronic kidney disease, stage 4 (severe): Secondary | ICD-10-CM | POA: Diagnosis not present

## 2021-09-11 DIAGNOSIS — B1920 Unspecified viral hepatitis C without hepatic coma: Secondary | ICD-10-CM | POA: Diagnosis not present

## 2021-09-11 DIAGNOSIS — N179 Acute kidney failure, unspecified: Secondary | ICD-10-CM | POA: Diagnosis not present

## 2021-09-11 DIAGNOSIS — N184 Chronic kidney disease, stage 4 (severe): Secondary | ICD-10-CM | POA: Diagnosis not present

## 2021-09-11 DIAGNOSIS — E611 Iron deficiency: Secondary | ICD-10-CM | POA: Diagnosis not present

## 2021-09-11 DIAGNOSIS — R600 Localized edema: Secondary | ICD-10-CM | POA: Diagnosis not present

## 2021-09-11 DIAGNOSIS — R7989 Other specified abnormal findings of blood chemistry: Secondary | ICD-10-CM | POA: Diagnosis not present

## 2021-09-11 DIAGNOSIS — S12000A Unspecified displaced fracture of first cervical vertebra, initial encounter for closed fracture: Secondary | ICD-10-CM | POA: Diagnosis not present

## 2021-09-11 DIAGNOSIS — I129 Hypertensive chronic kidney disease with stage 1 through stage 4 chronic kidney disease, or unspecified chronic kidney disease: Secondary | ICD-10-CM | POA: Diagnosis not present

## 2021-09-11 DIAGNOSIS — E669 Obesity, unspecified: Secondary | ICD-10-CM | POA: Diagnosis not present

## 2021-09-11 DIAGNOSIS — E1122 Type 2 diabetes mellitus with diabetic chronic kidney disease: Secondary | ICD-10-CM | POA: Diagnosis not present

## 2021-09-17 ENCOUNTER — Encounter: Payer: Self-pay | Admitting: Physical Medicine and Rehabilitation

## 2021-09-17 ENCOUNTER — Other Ambulatory Visit: Payer: Self-pay

## 2021-09-17 ENCOUNTER — Encounter
Payer: Medicare Other | Attending: Physical Medicine and Rehabilitation | Admitting: Physical Medicine and Rehabilitation

## 2021-09-17 VITALS — BP 103/65 | HR 76 | Ht 64.0 in | Wt 216.0 lb

## 2021-09-17 DIAGNOSIS — G43709 Chronic migraine without aura, not intractable, without status migrainosus: Secondary | ICD-10-CM | POA: Insufficient documentation

## 2021-09-17 DIAGNOSIS — S12090S Other displaced fracture of first cervical vertebra, sequela: Secondary | ICD-10-CM | POA: Diagnosis not present

## 2021-09-17 MED ORDER — TOPIRAMATE 25 MG PO TABS: 25.0000 mg | ORAL_TABLET | Freq: Every day | ORAL | 5 refills | Status: AC

## 2021-09-17 NOTE — Patient Instructions (Signed)
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    For headache prevention- Topamax- 25 mg nightly x 1 week; then 50 mg nightly- for headache prevention.   2.  Was seeing [facility]- Dr Patrice Paradise.  They were also doing trigger point injections; will go back there to get pain meds.    3. Outpt PT- to work on upper back pain and HEP.and mobilization- explained only so much movement due to fusion of cervical spine to C5.    4. Zanaflex 2-4 mg as needed up to 3x/day- can help muscle spasms/pain.    5. See PCP for kidney issues. - Cr down to 2.1 per pt.   6. F/U in 3 months C1 fx.

## 2021-09-17 NOTE — Progress Notes (Signed)
Subjective:    Patient ID: Becky Hobbs, female    DOB: Apr 02, 1946, 75 y.o.   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   Upper back is hurting really bad.  Not as much the neck Cannot lift.  Usually takes tylenol However this AM, took Norco Norco makes her crazy/confused.  Pt feels Oxycodone helps more.  Doesn't want to get addicted.  Needs hydroxyzine because both make her itch.    Saw Surgeon after left hospital- and then hasn't seen again.  Actually saw NP at that visit.   HA's- still gets often-       Finished H/H- extended it at visit with NP and finished 2 weeks ago.   Son brought her to appt today.   Bowel/bladder- going well.   Pain Inventory Average Pain 9 Pain Right Now 6 My pain is constant, stabbing, and aching  In the last 24 hours, has pain interfered with the following? General activity 6 Relation with others 6 Enjoyment of life 6 What TIME of day is your pain at its worst? morning  Sleep (in general) Poor  Pain is worse with: walking, standing, and some activites Pain improves with: rest, heat/ice, therapy/exercise, pacing activities, medication, TENS, and injections Relief from Meds: 5  Family History  Problem Relation Age of Onset   Kidney disease Mother    Vascular Disease Mother    Social History   Socioeconomic History   Marital status: Widowed    Spouse name: Not on file   Number of children: 3   Years of education: college   Highest education level: Not on file  Occupational History   Occupation: retired    Comment: Retired  Tobacco Use   Smoking status: Former    Types: Cigarettes   Smokeless tobacco: Never  Scientific laboratory technician Use: Never used  Substance and Sexual Activity   Alcohol use: No    Drug use: No   Sexual activity: Never  Other Topics Concern   Not on file  Social History Narrative   Patient has a high school education.    Patient is widow.   Patient is retired.   Social Determinants of Health   Financial Resource Strain: Not on file  Food Insecurity: Not on file  Transportation Needs: Not on file  Physical Activity: Not on file  Stress: Not on file  Social Connections: Not on file   Past Surgical History:  Procedure Laterality Date   ABDOMINAL HYSTERECTOMY     APPENDECTOMY     APPLICATION OF INTRAOPERATIVE CT SCAN N/A 06/05/2021   Procedure: APPLICATION OF INTRAOPERATIVE CT SCAN;  Surgeon: Dawley, Theodoro Doing, DO;  Location: [facility];  Service: Neurosurgery;  Laterality: N/A;   BACK SURGERY     bladder tack     BREAST CYST EXCISION     BREAST CYST INCISION AND DRAINAGE     BREAST SURGERY     BUNIONECTOMY     CHOLECYSTECTOMY     DILATION AND CURETTAGE OF UTERUS     ESOPHAGOGASTRODUODENOSCOPY (EGD) WITH PROPOFOL N/A 01/03/2019   Procedure: ESOPHAGOGASTRODUODENOSCOPY (EGD) WITH PROPOFOL;  Surgeon: Otis Brace, MD;  Location: [facility];  Service: Gastroenterology;  Laterality: N/A;   FOREIGN BODY REMOVAL  01/03/2019   Procedure: FOREIGN BODY REMOVAL;  Surgeon: Otis Brace, MD;  Location: [facility];  Service: Gastroenterology;;   FRACTURE SURGERY     left wrist   HERNIA REPAIR     hiatal hernia   LUMBAR FUSION  07/26/2017   LUMBAR LAMINECTOMY  03/04/2012   Procedure: MICRODISCECTOMY LUMBAR LAMINECTOMY;  Surgeon: Jessy Oto, MD;  Location: [facility];  Service: Orthopedics;  Laterality: N/A;  L3-4 central laminectomy   NASAL SEPTUM SURGERY     nissan fundoplication     OVARIAN CYST SURGERY     POSTERIOR CERVICAL FUSION/FORAMINOTOMY N/A 06/05/2021   Procedure: OCCIPITAL- CERVICAL TWO INSTRUMENTATION AND FUSION; EXTENSION TO CERVICAL FIVE;  Surgeon: Karsten Ro, DO;  Location: [facility];  Service: Neurosurgery;  Laterality: N/A;   THORACIC FUSION   07/26/2017   TONSILLECTOMY     Past Surgical History:  Procedure Laterality Date   ABDOMINAL HYSTERECTOMY     APPENDECTOMY     APPLICATION OF INTRAOPERATIVE CT SCAN N/A 06/05/2021   Procedure: APPLICATION OF INTRAOPERATIVE CT SCAN;  Surgeon: Dawley, Theodoro Doing, DO;  Location: [facility];  Service: Neurosurgery;  Laterality: N/A;   BACK SURGERY     bladder tack     BREAST CYST EXCISION     BREAST CYST INCISION AND DRAINAGE     BREAST SURGERY     BUNIONECTOMY     CHOLECYSTECTOMY     DILATION AND CURETTAGE OF UTERUS     ESOPHAGOGASTRODUODENOSCOPY (EGD) WITH PROPOFOL N/A 01/03/2019   Procedure: ESOPHAGOGASTRODUODENOSCOPY (EGD) WITH PROPOFOL;  Surgeon: Otis Brace, MD;  Location: [facility];  Service: Gastroenterology;  Laterality: N/A;   FOREIGN BODY REMOVAL  01/03/2019   Procedure: FOREIGN BODY REMOVAL;  Surgeon: Otis Brace, MD;  Location: [facility];  Service: Gastroenterology;;   FRACTURE SURGERY     left wrist   HERNIA REPAIR     hiatal hernia   LUMBAR FUSION  07/26/2017   LUMBAR LAMINECTOMY  03/04/2012   Procedure: MICRODISCECTOMY LUMBAR LAMINECTOMY;  Surgeon: Jessy Oto, MD;  Location: [facility];  Service: Orthopedics;  Laterality: N/A;  L3-4 central laminectomy   NASAL SEPTUM SURGERY     nissan fundoplication     OVARIAN CYST SURGERY     POSTERIOR CERVICAL FUSION/FORAMINOTOMY N/A 06/05/2021   Procedure: OCCIPITAL- CERVICAL TWO INSTRUMENTATION AND FUSION; EXTENSION TO CERVICAL FIVE;  Surgeon: Karsten Ro, DO;  Location: [facility];  Service: Neurosurgery;  Laterality: N/A;   THORACIC FUSION  07/26/2017   TONSILLECTOMY     Past Medical History:  Diagnosis Date   Anemia    Anxiety    panic attacks   Arthritis    Asthma    Blood transfusion    1973--with twins birth   Bronchitis    Chronic kidney disease    Diabetes mellitus    borderline   GERD (gastroesophageal reflux disease)    H/O hiatal hernia    surgery fixed   Headache    migraines   Heart murmur     MVP   Hepatitis    Hep C   Hypertension    Hypothyroidism    Pneumonia    BP 103/65   Pulse 76   Ht 5\' 4"  (1.626 m)   Wt 216 lb (98 kg)   SpO2 93%   BMI 37.08 kg/m   Opioid Risk Score:   Fall Risk Score:  `1  Depression screen PHQ 2/9  Depression screen PHQ 2/9 08/01/2021  Decreased Interest 0  Down,  Depressed, Hopeless 0  PHQ - 2 Score 0  Altered sleeping 3  Tired, decreased energy 3  Change in appetite 1  Feeling bad or failure about yourself  0  Trouble concentrating 0  Moving slowly or fidgety/restless 0  Suicidal thoughts 0  PHQ-9 Score 7     Review of Systems  Musculoskeletal:  Positive for back pain.  All other systems reviewed and are negative.     Objective:   Physical Exam  Awake, alert, appropriate, Ox3; sitting on table- no collar; NAD MS: 5/5 in UE and LE's B/L   Trigger points in upper back and mid back - above normal bra line-   Neuro: No limitation in sensation to light touch No hoffman's (-) B/L  No clonus B/L   Very little ROM of cervical rotation- cannot extend neck or rotatete at all.      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    For headache prevention- Topamax- 25 mg nightly x 1 week; then 50 mg nightly- for headache prevention.   2.  Was seeing [facility]- Dr Patrice Paradise.  They were also doing trigger point injections; will go back there to get pain meds.    3. Outpt PT- to work on upper back pain and HEP.and mobilization- explained only so much movement due to fusion of cervical spine to C5.    4. Zanaflex 2-4 mg as needed up to 3x/day- can help muscle spasms/pain.    5. See PCP for kidney issues. - Cr down to 2.1 per pt.   6. F/U in 3 months C1 fx.     I spent a total of 22 minutes on  visit- discussing fusion and muscle spasms and lack of Neuro Sx's left- no SCI.

## 2021-09-21 DIAGNOSIS — Z20822 Contact with and (suspected) exposure to covid-19: Secondary | ICD-10-CM | POA: Diagnosis not present

## 2021-09-25 DIAGNOSIS — Z6838 Body mass index (BMI) 38.0-38.9, adult: Secondary | ICD-10-CM | POA: Diagnosis not present

## 2021-09-25 DIAGNOSIS — Z981 Arthrodesis status: Secondary | ICD-10-CM | POA: Diagnosis not present

## 2021-09-25 DIAGNOSIS — M542 Cervicalgia: Secondary | ICD-10-CM | POA: Diagnosis not present

## 2021-09-25 DIAGNOSIS — M4722 Other spondylosis with radiculopathy, cervical region: Secondary | ICD-10-CM | POA: Diagnosis not present

## 2021-10-08 ENCOUNTER — Ambulatory Visit: Payer: Medicare Other | Admitting: Physical Therapy

## 2021-10-29 ENCOUNTER — Encounter: Payer: Self-pay | Admitting: Physical Therapy

## 2021-10-29 ENCOUNTER — Ambulatory Visit: Payer: Medicare Other | Attending: Physical Medicine and Rehabilitation | Admitting: Physical Therapy

## 2021-10-29 ENCOUNTER — Ambulatory Visit: Payer: Medicare Other | Admitting: Physical Therapy

## 2021-10-29 ENCOUNTER — Other Ambulatory Visit: Payer: Self-pay

## 2021-10-29 DIAGNOSIS — R2681 Unsteadiness on feet: Secondary | ICD-10-CM | POA: Diagnosis present

## 2021-10-29 DIAGNOSIS — R262 Difficulty in walking, not elsewhere classified: Secondary | ICD-10-CM | POA: Insufficient documentation

## 2021-10-29 DIAGNOSIS — R278 Other lack of coordination: Secondary | ICD-10-CM | POA: Insufficient documentation

## 2021-10-29 DIAGNOSIS — S12090S Other displaced fracture of first cervical vertebra, sequela: Secondary | ICD-10-CM | POA: Diagnosis not present

## 2021-10-29 DIAGNOSIS — M5412 Radiculopathy, cervical region: Secondary | ICD-10-CM | POA: Diagnosis present

## 2021-10-29 DIAGNOSIS — M6281 Muscle weakness (generalized): Secondary | ICD-10-CM | POA: Diagnosis present

## 2021-10-29 NOTE — Therapy (Signed)
[facility]. [place], [place], [address] Phone: [phone]   Fax:  [phone]  Physical Therapy Evaluation  Patient Details  Name: Becky Hobbs MRN: [id number] Date of Birth: 1945/10/19 Referring Provider (PT): Lovorn   Encounter Date: 10/29/2021   PT End of Session - 10/29/21 1715     Visit Number 1    Number of Visits 16    Date for PT Re-Evaluation 12/24/21    PT Start Time 1634    PT Stop Time 1714    PT Time Calculation (min) 40 min    Activity Tolerance Patient tolerated treatment well    Behavior During Therapy WFL for tasks assessed/performed             Past Medical History:  Diagnosis Date   Anemia    Anxiety    panic attacks   Arthritis    Asthma    Blood transfusion    1973--with twins birth   Bronchitis    Chronic kidney disease    Diabetes mellitus    borderline   GERD (gastroesophageal reflux disease)    H/O hiatal hernia    surgery fixed   Headache    migraines   Heart murmur    MVP   Hepatitis    Hep C   Hypertension    Hypothyroidism    Pneumonia     Past Surgical History:  Procedure Laterality Date   ABDOMINAL HYSTERECTOMY     APPENDECTOMY     APPLICATION OF INTRAOPERATIVE CT SCAN N/A 06/05/2021   Procedure: APPLICATION OF INTRAOPERATIVE CT SCAN;  Surgeon: Karsten Ro, DO;  Location: [facility];  Service: Neurosurgery;  Laterality: N/A;   BACK SURGERY     bladder tack     BREAST CYST EXCISION     BREAST CYST INCISION AND DRAINAGE     BREAST SURGERY     BUNIONECTOMY     CHOLECYSTECTOMY     DILATION AND CURETTAGE OF UTERUS     ESOPHAGOGASTRODUODENOSCOPY (EGD) WITH PROPOFOL N/A 01/03/2019   Procedure: ESOPHAGOGASTRODUODENOSCOPY (EGD) WITH PROPOFOL;  Surgeon: Otis Brace, MD;  Location: [facility];  Service: Gastroenterology;  Laterality: N/A;   FOREIGN BODY REMOVAL  01/03/2019   Procedure: FOREIGN BODY REMOVAL;  Surgeon: Otis Brace, MD;  Location:  [facility];  Service: Gastroenterology;;   FRACTURE SURGERY     left wrist   HERNIA REPAIR     hiatal hernia   LUMBAR FUSION  07/26/2017   LUMBAR LAMINECTOMY  03/04/2012   Procedure: MICRODISCECTOMY LUMBAR LAMINECTOMY;  Surgeon: Jessy Oto, MD;  Location: [facility];  Service: Orthopedics;  Laterality: N/A;  L3-4 central laminectomy   NASAL SEPTUM SURGERY     nissan fundoplication     OVARIAN CYST SURGERY     POSTERIOR CERVICAL FUSION/FORAMINOTOMY N/A 06/05/2021   Procedure: OCCIPITAL- CERVICAL TWO INSTRUMENTATION AND FUSION; EXTENSION TO CERVICAL FIVE;  Surgeon: Karsten Ro, DO;  Location: [facility];  Service: Neurosurgery;  Laterality: N/A;   THORACIC FUSION  07/26/2017   TONSILLECTOMY      There were no vitals filed for this visit.    Subjective Assessment - 10/29/21 1634     Subjective Patient had a neck fusion from occiput to C5. She has had 4 back surgeries recently. She feels her neck is weak and also her legs are weak after all of the surgeries. She also feels her balance if off. She had injections for trigger points in her  upper and lower back 2 days ago, which seem to have helped some with the pain.    Pertinent History Multiple back surgeries, recent neck fusion, 8/22, with therapy afterwards.    Limitations Sitting;Lifting;Standing;Walking;House hold activities    How long can you sit comfortably? 20 minutes    Patient Stated Goals Strengthen neck and LE, improve balance.    Currently in Pain? No/denies                [facility] PT Assessment - 10/29/21 0001       Assessment   Medical Diagnosis Neck pain    Referring Provider (PT) Lovorn      Precautions   Precaution Comments Dont lift over 10 pounds, no excessive reaching/stretching.    Required Braces or Orthoses Cervical Brace      Balance Screen   Has the patient fallen in the past 6 months No      [place] residence    Living Arrangements Children    Available Help at  Discharge Family    Type of [place] to enter    Entrance Stairs-Number of Steps 4    Entrance Stairs-Rails Right    Home Layout Two level    Alternate Level Stairs-Number of Steps 14    Alternate Level Stairs-Rails Can reach both    Additional Comments she has to be very careful managing steps      Prior Function   Level of Independence Independent    Vocation Retired    Optometrist, travel, drive      Cognition   Overall Cognitive Status Within Functional Limits for tasks assessed      ROM / Strength   AROM / PROM / Strength AROM;Strength      AROM   Overall AROM Comments BUE and LE WFL.    AROM Assessment Site Cervical;Lumbar    Cervical Flexion 10%    Cervical Extension 10%    Cervical - Right Side Bend 10%    Cervical - Left Side Bend 10%    Cervical - Right Rotation 10%    Cervical - Left Rotation 10%    Lumbar Flexion 10%    Lumbar Extension 10%    Lumbar - Right Side Bend 10%    Lumbar - Left Side Bend 10%    Lumbar - Right Rotation 10%    Lumbar - Left Rotation 10%      Strength   Overall Strength Comments BUE, BLE MMT at least 4/5. Functionally, patient demosntrates decreased coordinated function, decreased muscular endurance.      Transfers   Five time sit to stand comments  25.33      Ambulation/Gait   Gait Comments Patient wlks with small, guarded step, decresed weight shifts over BOs, no AD.      Balance   Balance Assessed Yes      Standardized Balance Assessment   Standardized Balance Assessment Dynamic Gait Index;Timed Up and Go Test      Dynamic Gait Index   Level Surface Mild Impairment    Change in Gait Speed Mild Impairment    Gait with Horizontal Head Turns Mild Impairment    Gait with Vertical Head Turns Mild Impairment    Gait and Pivot Turn Mild Impairment    Step Over Obstacle Mild Impairment    Step Around Obstacles Mild Impairment    Steps Moderate Impairment    Total Score 15  Timed Up and Go Test    Normal TUG (seconds) 12.92      High Level Balance   High Level Balance Comments Patient reports incresed difficulty with balance, partially due to her limited cerv and back ROM. She moves in guarded fashion with small steps.      Functional Gait  Assessment   Gait assessed  Yes                        Objective measurements completed on examination: See above findings.                PT Education - 10/29/21 1715     Education Details POC to address strength, muscular tightness/trigger points, balance.    Person(s) Educated Patient    Methods Explanation    Comprehension Verbalized understanding              PT Short Term Goals - 10/29/21 1757       PT SHORT TERM GOAL #1   Title I with initial HEP    Time 4    Period Weeks    Status New    Target Date 11/26/21               PT Long Term Goals - 10/29/21 1757       PT LONG TERM GOAL #1   Title independent with HEP and how to progress herself    Time 8    Period Weeks    Status New    Target Date 12/24/21      PT LONG TERM GOAL #2   Title Perform TUG in <10 sec to demosntrate imporved balance and confidence.    Baseline 12.92    Time 8    Period Weeks    Status New    Target Date 12/24/21      PT LONG TERM GOAL #3   Title Paitent will score at least 19/24 on DGI.    Baseline 15/24    Time 8    Period Weeks    Status New    Target Date 12/24/21      PT LONG TERM GOAL #4   Title Patient will be able to get through her day with pain </+3/10    Baseline Up to 8/10    Time 8    Period Weeks    Status New    Target Date 12/24/21                    Plan - 10/29/21 1717     Clinical Impression Statement Patient presents after multiple back fusions and recent cervical fusion from occiput to C5. She demosntrates muscular trigger points, pain, weakness, decreased balance. She will benefit from PT to address these deficits to maximize safety and I with all  functional mobility, decrease fall risk.    Personal Factors and Comorbidities Past/Current Experience;Comorbidity 1    Comorbidities multiple back surgeries.    Examination-Activity Limitations Locomotion Level;Reach Overhead;Bend;Sit;Sleep;Carry;Squat;Stairs;Lift    Examination-Participation Restrictions Cleaning;Laundry    Stability/Clinical Decision Making Stable/Uncomplicated    Clinical Decision Making Low    Rehab Potential Good    PT Frequency 2x / week    PT Duration 8 weeks    PT Treatment/Interventions ADLs/Self Care Home Management;DME Instruction;Neuromuscular re-education;Manual techniques;Balance training;Therapeutic exercise;Therapeutic activities;Functional mobility training;Stair training;Gait training;Dry needling;Passive range of motion;Moist Heat;Iontophoresis 4mg /ml Dexamethasone    PT Next Visit Plan Initiate HEP    Consulted and  Agree with Plan of Care Patient             Patient will benefit from skilled therapeutic intervention in order to improve the following deficits and impairments:  Abnormal gait, Decreased range of motion, Difficulty walking, Pain, Decreased mobility, Decreased strength, Postural dysfunction, Improper body mechanics, Impaired flexibility, Increased muscle spasms  Visit Diagnosis: Radiculopathy, cervical region  Muscle weakness (generalized)  Unsteadiness on feet  Other lack of coordination  Difficulty in walking, not elsewhere classified     Problem List Patient Active Problem List   Diagnosis Date Noted   Hepatic cirrhosis ([employer]) 06/26/2021   Fracture of C1 vertebra, closed ([place]) 06/09/2021   Closed C1 fracture ([place]) 06/05/2021   Plantar fasciitis of left foot 08/21/2018   Acute renal failure (ARF) ([place]) 02/09/2018   Chronic migraine without aura without status migrainosus, not intractable 06/16/2017   History of lumbar fusion 08/03/2016    Class: Chronic   Migraine 04/09/2016   Acute-on-chronic kidney injury ([place])  09/07/2015   Hypertension 09/07/2015   Hypothyroidism 09/07/2015   Gout 09/07/2015   Low back pain 09/07/2015   Obesity (BMI 30-39.9) 07/06/2013   Headache 04/12/2013   Concussion 04/12/2013   Lumbar spinal stenosis 03/04/2012    Class: Diagnosis of    Marcelina Morel, DPT 10/29/2021, 6:04 PM  [facility]. [place], [place], [address] Phone: [phone]   Fax:  [phone]  Name: Becky Hobbs MRN: [id number] Date of Birth: November 03, 1945

## 2021-11-04 ENCOUNTER — Encounter: Payer: Self-pay | Admitting: Physical Therapy

## 2021-11-04 ENCOUNTER — Ambulatory Visit: Payer: Medicare Other | Admitting: Physical Therapy

## 2021-11-04 ENCOUNTER — Other Ambulatory Visit: Payer: Self-pay

## 2021-11-04 DIAGNOSIS — M5412 Radiculopathy, cervical region: Secondary | ICD-10-CM

## 2021-11-04 DIAGNOSIS — R262 Difficulty in walking, not elsewhere classified: Secondary | ICD-10-CM

## 2021-11-04 DIAGNOSIS — R2681 Unsteadiness on feet: Secondary | ICD-10-CM

## 2021-11-04 DIAGNOSIS — M6281 Muscle weakness (generalized): Secondary | ICD-10-CM

## 2021-11-04 NOTE — Therapy (Signed)
[place]. [place], [place], [address] Phone: [phone]   Fax:  [phone]  Physical Therapy Treatment  Patient Details  Name: Becky Hobbs MRN: [id number] Date of Birth: Jun 07, 1946 Referring Provider (PT): Lovorn   Encounter Date: 11/04/2021   PT End of Session - 11/04/21 1637     Visit Number 2    Date for PT Re-Evaluation 12/24/21    PT Start Time 1600    PT Stop Time 1640    PT Time Calculation (min) 40 min    Activity Tolerance Patient tolerated treatment well    Behavior During Therapy [facility] for tasks assessed/performed             Past Medical History:  Diagnosis Date   Anemia    Anxiety    panic attacks   Arthritis    Asthma    Blood transfusion    1973--with twins birth   Bronchitis    Chronic kidney disease    Diabetes mellitus    borderline   GERD (gastroesophageal reflux disease)    H/O hiatal hernia    surgery fixed   Headache    migraines   Heart murmur    MVP   Hepatitis    Hep C   Hypertension    Hypothyroidism    Pneumonia     Past Surgical History:  Procedure Laterality Date   ABDOMINAL HYSTERECTOMY     APPENDECTOMY     APPLICATION OF INTRAOPERATIVE CT SCAN N/A 06/05/2021   Procedure: APPLICATION OF INTRAOPERATIVE CT SCAN;  Surgeon: Karsten Ro, DO;  Location: [place];  Service: Neurosurgery;  Laterality: N/A;   BACK SURGERY     bladder tack     BREAST CYST EXCISION     BREAST CYST INCISION AND DRAINAGE     BREAST SURGERY     BUNIONECTOMY     CHOLECYSTECTOMY     DILATION AND CURETTAGE OF UTERUS     ESOPHAGOGASTRODUODENOSCOPY (EGD) WITH PROPOFOL N/A 01/03/2019   Procedure: ESOPHAGOGASTRODUODENOSCOPY (EGD) WITH PROPOFOL;  Surgeon: Otis Brace, MD;  Location: [place];  Service: Gastroenterology;  Laterality: N/A;   FOREIGN BODY REMOVAL  01/03/2019   Procedure: FOREIGN BODY REMOVAL;  Surgeon: Otis Brace, MD;  Location: [place] [facility];  Service:  Gastroenterology;;   FRACTURE SURGERY     left wrist   HERNIA REPAIR     hiatal hernia   LUMBAR FUSION  07/26/2017   LUMBAR LAMINECTOMY  03/04/2012   Procedure: MICRODISCECTOMY LUMBAR LAMINECTOMY;  Surgeon: Jessy Oto, MD;  Location: [place];  Service: Orthopedics;  Laterality: N/A;  L3-4 central laminectomy   NASAL SEPTUM SURGERY     nissan fundoplication     OVARIAN CYST SURGERY     POSTERIOR CERVICAL FUSION/FORAMINOTOMY N/A 06/05/2021   Procedure: OCCIPITAL- CERVICAL TWO INSTRUMENTATION AND FUSION; EXTENSION TO CERVICAL FIVE;  Surgeon: Karsten Ro, DO;  Location: [place];  Service: Neurosurgery;  Laterality: N/A;   THORACIC FUSION  07/26/2017   TONSILLECTOMY      There were no vitals filed for this visit.   Subjective Assessment - 11/04/21 1604     Subjective Pretty good, her neck gets tired fast. Legs feel weak    Currently in Pain? No/denies                               [facility] Adult PT Treatment/Exercise - 11/04/21 0001  Exercises   Exercises Neck;Lumbar      Neck Exercises: Machines for Strengthening   Nustep L4 x 6 min      Neck Exercises: Standing   Other Standing Exercises Rows red 2x10    Other Standing Exercises Shrugs 3lb 2x10      Neck Exercises: Seated   Other Seated Exercise ER red 2x10      Lumbar Exercises: Machines for Strengthening   Cybex Knee Flexion 20lb 2x10      Lumbar Exercises: Seated   Sit to Stand 10 reps   x2                      PT Short Term Goals - 10/29/21 1757       PT SHORT TERM GOAL #1   Title I with initial HEP    Time 4    Period Weeks    Status New    Target Date 11/26/21               PT Long Term Goals - 10/29/21 1757       PT LONG TERM GOAL #1   Title independent with HEP and how to progress herself    Time 8    Period Weeks    Status New    Target Date 12/24/21      PT LONG TERM GOAL #2   Title Perform TUG in <10 sec to demosntrate imporved balance and  confidence.    Baseline 12.92    Time 8    Period Weeks    Status New    Target Date 12/24/21      PT LONG TERM GOAL #3   Title Paitent will score at least 19/24 on DGI.    Baseline 15/24    Time 8    Period Weeks    Status New    Target Date 12/24/21      PT LONG TERM GOAL #4   Title Patient will be able to get through her day with pain </+3/10    Baseline Up to 8/10    Time 8    Period Weeks    Status New    Target Date 12/24/21                   Plan - 11/04/21 1616     Clinical Impression Statement Pt tolerated an initial progression to TE well. HEP interventions implemented in session for better compliance. Cue for core engagement needed with standing shoulder extensions. Some instability noted with step ups, requiring min assist at times. LE fatigue with hamstring curls.    Personal Factors and Comorbidities Past/Current Experience;Comorbidity 1    Examination-Activity Limitations Locomotion Level;Reach Overhead;Bend;Sit;Sleep;Carry;Squat;Stairs;Lift    Examination-Participation Restrictions Cleaning;Laundry    Stability/Clinical Decision Making Stable/Uncomplicated    Rehab Potential Good    PT Frequency 2x / week    PT Treatment/Interventions ADLs/Self Care Home Management;DME Instruction;Neuromuscular re-education;Manual techniques;Balance training;Therapeutic exercise;Therapeutic activities;Functional mobility training;Stair training;Gait training;Dry needling;Passive range of motion;Moist Heat;Iontophoresis 4mg /ml Dexamethasone    PT Next Visit Plan Assess HEP, postural strength    PT Home Exercise Plan Access Code: [email or web address]  URL: [email or web address]  Date: 11/04/2021  Prepared by: Cheri Fowler    Exercises  Sit to Stand - 1 x daily - 7 x weekly - 3 sets - 10 reps  Standing Row with Resistance - 1 x daily - 7 x weekly - 3 sets - 10 reps  Standing Shoulder Extension with Resistance -  1 x daily - 7 x weekly - 3 sets - 10 reps              Patient will benefit from skilled therapeutic intervention in order to improve the following deficits and impairments:  Abnormal gait, Decreased range of motion, Difficulty walking, Pain, Decreased mobility, Decreased strength, Postural dysfunction, Improper body mechanics, Impaired flexibility, Increased muscle spasms  Visit Diagnosis: Radiculopathy, cervical region  Muscle weakness (generalized)  Unsteadiness on feet  Difficulty in walking, not elsewhere classified     Problem List Patient Active Problem List   Diagnosis Date Noted   Hepatic cirrhosis ([place]) 06/26/2021   Fracture of C1 vertebra, closed ([place]) 06/09/2021   Closed C1 fracture ([place]) 06/05/2021   Plantar fasciitis of left foot 08/21/2018   Acute renal failure (ARF) ([place]) 02/09/2018   Chronic migraine without aura without status migrainosus, not intractable 06/16/2017   History of lumbar fusion 08/03/2016    Class: Chronic   Migraine 04/09/2016   Acute-on-chronic kidney injury ([place]) 09/07/2015   Hypertension 09/07/2015   Hypothyroidism 09/07/2015   Gout 09/07/2015   Low back pain 09/07/2015   Obesity (BMI 30-39.9) 07/06/2013   Headache 04/12/2013   Concussion 04/12/2013   Lumbar spinal stenosis 03/04/2012    Class: Diagnosis of    Scot Jun, PTA 11/04/2021, 4:43 PM  [place]. [place], [place], [address] Phone: [phone]   Fax:  [phone]  Name: Becky Hobbs MRN: [id number] Date of Birth: [birth year]

## 2021-11-06 ENCOUNTER — Other Ambulatory Visit: Payer: Self-pay

## 2021-11-06 ENCOUNTER — Ambulatory Visit: Payer: Medicare Other | Admitting: Physical Therapy

## 2021-11-06 DIAGNOSIS — M5412 Radiculopathy, cervical region: Secondary | ICD-10-CM

## 2021-11-06 DIAGNOSIS — M6281 Muscle weakness (generalized): Secondary | ICD-10-CM

## 2021-11-06 NOTE — Therapy (Signed)
[place]. [place], [place], [address] Phone: [phone]   Fax:  [phone]  Physical Therapy Treatment  Patient Details  Name: Becky Hobbs MRN: [id number] Date of Birth: 1946-07-05 Referring Provider (PT): Lovorn   Encounter Date: 11/06/2021   PT End of Session - 11/06/21 1707     Visit Number 3    Number of Visits 16    Date for PT Re-Evaluation 12/24/21    PT Start Time 1620    PT Stop Time 23    PT Time Calculation (min) 45 min             Past Medical History:  Diagnosis Date   Anemia    Anxiety    panic attacks   Arthritis    Asthma    Blood transfusion    1973--with twins birth   Bronchitis    Chronic kidney disease    Diabetes mellitus    borderline   GERD (gastroesophageal reflux disease)    H/O hiatal hernia    surgery fixed   Headache    migraines   Heart murmur    MVP   Hepatitis    Hep C   Hypertension    Hypothyroidism    Pneumonia     Past Surgical History:  Procedure Laterality Date   ABDOMINAL HYSTERECTOMY     APPENDECTOMY     APPLICATION OF INTRAOPERATIVE CT SCAN N/A 06/05/2021   Procedure: APPLICATION OF INTRAOPERATIVE CT SCAN;  Surgeon: Karsten Ro, DO;  Location: [place];  Service: Neurosurgery;  Laterality: N/A;   BACK SURGERY     bladder tack     BREAST CYST EXCISION     BREAST CYST INCISION AND DRAINAGE     BREAST SURGERY     BUNIONECTOMY     CHOLECYSTECTOMY     DILATION AND CURETTAGE OF UTERUS     ESOPHAGOGASTRODUODENOSCOPY (EGD) WITH PROPOFOL N/A 01/03/2019   Procedure: ESOPHAGOGASTRODUODENOSCOPY (EGD) WITH PROPOFOL;  Surgeon: Otis Brace, MD;  Location: [place];  Service: Gastroenterology;  Laterality: N/A;   FOREIGN BODY REMOVAL  01/03/2019   Procedure: FOREIGN BODY REMOVAL;  Surgeon: Otis Brace, MD;  Location: [place] [facility];  Service: Gastroenterology;;   FRACTURE SURGERY     left wrist   HERNIA REPAIR     hiatal hernia    LUMBAR FUSION  07/26/2017   LUMBAR LAMINECTOMY  03/04/2012   Procedure: MICRODISCECTOMY LUMBAR LAMINECTOMY;  Surgeon: Jessy Oto, MD;  Location: [place];  Service: Orthopedics;  Laterality: N/A;  L3-4 central laminectomy   NASAL SEPTUM SURGERY     nissan fundoplication     OVARIAN CYST SURGERY     POSTERIOR CERVICAL FUSION/FORAMINOTOMY N/A 06/05/2021   Procedure: OCCIPITAL- CERVICAL TWO INSTRUMENTATION AND FUSION; EXTENSION TO CERVICAL FIVE;  Surgeon: Karsten Ro, DO;  Location: [place];  Service: Neurosurgery;  Laterality: N/A;   THORACIC FUSION  07/26/2017   TONSILLECTOMY      There were no vitals filed for this visit.   Subjective Assessment - 11/06/21 1622     Subjective did HEP , doing okay. amb in without AD very stiff. pain when i first get up and then it improves    Currently in Pain? No/denies                               [facility] Adult PT Treatment/Exercise - 11/06/21 0001  Neck Exercises: Machines for Strengthening   UBE (Upper Arm Bike) L 2 2 min fwd/2 min back    Nustep L4 x 6 min      Neck Exercises: Standing   Other Standing Exercises Rows red 2x10    Other Standing Exercises shruggs, backward circles and  shld ext 4# 15 x      Lumbar Exercises: Standing   Other Standing Lumbar Exercises 6 inch step with 2 rails 10 x each   alt step tap 20 x with UE support     Lumbar Exercises: Seated   Long Arc Quad on Chair Strengthening;Both;2 sets;10 reps    LAQ on Chair Weights (lbs) 3    Sit to Stand 10 reps   2x from mat without UE with 2# each hand chest press   Other Seated Lumbar Exercises HS curl green tband 2 sets 10                       PT Short Term Goals - 11/06/21 1654       PT SHORT TERM GOAL #1   Title I with initial HEP    Status Achieved               PT Long Term Goals - 10/29/21 1757       PT LONG TERM GOAL #1   Title independent with HEP and how to progress herself    Time 8    Period Weeks     Status New    Target Date 12/24/21      PT LONG TERM GOAL #2   Title Perform TUG in <10 sec to demosntrate imporved balance and confidence.    Baseline 12.92    Time 8    Period Weeks    Status New    Target Date 12/24/21      PT LONG TERM GOAL #3   Title Paitent will score at least 19/24 on DGI.    Baseline 15/24    Time 8    Period Weeks    Status New    Target Date 12/24/21      PT LONG TERM GOAL #4   Title Patient will be able to get through her day with pain </+3/10    Baseline Up to 8/10    Time 8    Period Weeks    Status New    Target Date 12/24/21                   Plan - 11/06/21 1707     Clinical Impression Statement STG met. Increased exercises with resistance and cuing. Fatigue is and issue but with short breaks and alt UE and LE pt did very wel- she is very motivted.Cuing needed to decrease compensations    PT Treatment/Interventions ADLs/Self Care Home Management;DME Instruction;Neuromuscular re-education;Manual techniques;Balance training;Therapeutic exercise;Therapeutic activities;Functional mobility training;Stair training;Gait training;Dry needling;Passive range of motion;Moist Heat;Iontophoresis 4mg /ml Dexamethasone    PT Next Visit Plan total body strength and conditioning             Patient will benefit from skilled therapeutic intervention in order to improve the following deficits and impairments:  Abnormal gait, Decreased range of motion, Difficulty walking, Pain, Decreased mobility, Decreased strength, Postural dysfunction, Improper body mechanics, Impaired flexibility, Increased muscle spasms  Visit Diagnosis: Radiculopathy, cervical region  Muscle weakness (generalized)     Problem List Patient Active Problem List   Diagnosis Date Noted   Hepatic cirrhosis ([place])  06/26/2021   Fracture of C1 vertebra, closed ([place]) 06/09/2021   Closed C1 fracture ([place]) 06/05/2021   Plantar fasciitis of left foot 08/21/2018   Acute renal  failure (ARF) ([place]) 02/09/2018   Chronic migraine without aura without status migrainosus, not intractable 06/16/2017   History of lumbar fusion 08/03/2016    Class: Chronic   Migraine 04/09/2016   Acute-on-chronic kidney injury ([place]) 09/07/2015   Hypertension 09/07/2015   Hypothyroidism 09/07/2015   Gout 09/07/2015   Low back pain 09/07/2015   Obesity (BMI 30-39.9) 07/06/2013   Headache 04/12/2013   Concussion 04/12/2013   Lumbar spinal stenosis 03/04/2012    Class: Diagnosis of    Deveney Bayon,ANGIE, PTA 11/06/2021, 5:09 PM  [place]. [place], [place], [address] Phone: [phone]   Fax:  [phone]  Name: Becky Hobbs MRN: [id number] Date of Birth: 1946/07/04

## 2021-11-10 ENCOUNTER — Ambulatory Visit: Payer: Medicare Other | Admitting: Physical Therapy

## 2021-11-13 ENCOUNTER — Ambulatory Visit: Payer: Medicare Other | Attending: Physical Medicine and Rehabilitation | Admitting: Physical Therapy

## 2021-11-13 DIAGNOSIS — R2681 Unsteadiness on feet: Secondary | ICD-10-CM | POA: Insufficient documentation

## 2021-11-13 DIAGNOSIS — R262 Difficulty in walking, not elsewhere classified: Secondary | ICD-10-CM | POA: Insufficient documentation

## 2021-11-13 DIAGNOSIS — M5442 Lumbago with sciatica, left side: Secondary | ICD-10-CM | POA: Insufficient documentation

## 2021-11-13 DIAGNOSIS — M5412 Radiculopathy, cervical region: Secondary | ICD-10-CM | POA: Insufficient documentation

## 2021-11-13 DIAGNOSIS — G8929 Other chronic pain: Secondary | ICD-10-CM | POA: Insufficient documentation

## 2021-11-13 DIAGNOSIS — R278 Other lack of coordination: Secondary | ICD-10-CM | POA: Insufficient documentation

## 2021-11-13 DIAGNOSIS — M6281 Muscle weakness (generalized): Secondary | ICD-10-CM | POA: Insufficient documentation

## 2021-11-17 ENCOUNTER — Encounter: Payer: Self-pay | Admitting: Physical Therapy

## 2021-11-17 ENCOUNTER — Other Ambulatory Visit: Payer: Self-pay

## 2021-11-17 ENCOUNTER — Ambulatory Visit: Payer: Medicare Other | Admitting: Physical Therapy

## 2021-11-17 DIAGNOSIS — R262 Difficulty in walking, not elsewhere classified: Secondary | ICD-10-CM | POA: Diagnosis present

## 2021-11-17 DIAGNOSIS — M6281 Muscle weakness (generalized): Secondary | ICD-10-CM

## 2021-11-17 DIAGNOSIS — M5442 Lumbago with sciatica, left side: Secondary | ICD-10-CM | POA: Diagnosis present

## 2021-11-17 DIAGNOSIS — M5412 Radiculopathy, cervical region: Secondary | ICD-10-CM | POA: Diagnosis present

## 2021-11-17 DIAGNOSIS — G8929 Other chronic pain: Secondary | ICD-10-CM | POA: Diagnosis present

## 2021-11-17 DIAGNOSIS — R2681 Unsteadiness on feet: Secondary | ICD-10-CM | POA: Diagnosis present

## 2021-11-17 DIAGNOSIS — R278 Other lack of coordination: Secondary | ICD-10-CM | POA: Diagnosis present

## 2021-11-17 NOTE — Patient Instructions (Signed)
Access Code: [email or web address] URL: [email or web address] Date: 11/17/2021 Prepared by: Ethel Rana  Exercises Sit to Stand - 1 x daily - 7 x weekly - 3 sets - 10 reps Standing Row with Resistance - 1 x daily - 7 x weekly - 3 sets - 10 reps Standing Shoulder Extension with Resistance - 1 x daily - 7 x weekly - 3 sets - 10 reps Standing Knee Flexion with Ankle Weights and Unilateral Counter Support - 1 x daily - 7 x weekly - 2 sets - 10 reps Seated Long Arc Quad with Ankle Weight - 1 x daily - 7 x weekly - 2 sets - 10 reps Standing Hip Abduction with Ankle Weight - 1 x daily - 7 x weekly - 2 sets - 10 reps Standing Shoulder Shrugs with Dumbbells - 1 x daily - 7 x weekly - 2 sets - 10 reps Shoulder External Rotation and Scapular Retraction with Resistance - 1 x daily - 7 x weekly - 2 sets - 10 reps

## 2021-11-17 NOTE — Therapy (Signed)
[place]. [place], [place], [address] Phone: [phone]   Fax:  [phone]  Physical Therapy Treatment  Patient Details  Name: Becky Hobbs MRN: [id number] Date of Birth: 1946/05/16 Referring Provider (PT): Lovorn   Encounter Date: 11/17/2021   PT End of Session - 11/17/21 1542     Visit Number 4    Number of Visits 16    Date for PT Re-Evaluation 12/24/21    PT Start Time 24    PT Stop Time 6387    PT Time Calculation (min) 38 min    Activity Tolerance Patient tolerated treatment well    Behavior During Therapy [facility] for tasks assessed/performed             Past Medical History:  Diagnosis Date   Anemia    Anxiety    panic attacks   Arthritis    Asthma    Blood transfusion    1973--with twins birth   Bronchitis    Chronic kidney disease    Diabetes mellitus    borderline   GERD (gastroesophageal reflux disease)    H/O hiatal hernia    surgery fixed   Headache    migraines   Heart murmur    MVP   Hepatitis    Hep C   Hypertension    Hypothyroidism    Pneumonia     Past Surgical History:  Procedure Laterality Date   ABDOMINAL HYSTERECTOMY     APPENDECTOMY     APPLICATION OF INTRAOPERATIVE CT SCAN N/A 06/05/2021   Procedure: APPLICATION OF INTRAOPERATIVE CT SCAN;  Surgeon: Karsten Ro, DO;  Location: [place];  Service: Neurosurgery;  Laterality: N/A;   BACK SURGERY     bladder tack     BREAST CYST EXCISION     BREAST CYST INCISION AND DRAINAGE     BREAST SURGERY     BUNIONECTOMY     CHOLECYSTECTOMY     DILATION AND CURETTAGE OF UTERUS     ESOPHAGOGASTRODUODENOSCOPY (EGD) WITH PROPOFOL N/A 01/03/2019   Procedure: ESOPHAGOGASTRODUODENOSCOPY (EGD) WITH PROPOFOL;  Surgeon: Otis Brace, MD;  Location: [place];  Service: Gastroenterology;  Laterality: N/A;   FOREIGN BODY REMOVAL  01/03/2019   Procedure: FOREIGN BODY REMOVAL;  Surgeon: Otis Brace, MD;  Location:  [place] [facility];  Service: Gastroenterology;;   FRACTURE SURGERY     left wrist   HERNIA REPAIR     hiatal hernia   LUMBAR FUSION  07/26/2017   LUMBAR LAMINECTOMY  03/04/2012   Procedure: MICRODISCECTOMY LUMBAR LAMINECTOMY;  Surgeon: Jessy Oto, MD;  Location: [place];  Service: Orthopedics;  Laterality: N/A;  L3-4 central laminectomy   NASAL SEPTUM SURGERY     nissan fundoplication     OVARIAN CYST SURGERY     POSTERIOR CERVICAL FUSION/FORAMINOTOMY N/A 06/05/2021   Procedure: OCCIPITAL- CERVICAL TWO INSTRUMENTATION AND FUSION; EXTENSION TO CERVICAL FIVE;  Surgeon: Karsten Ro, DO;  Location: [place];  Service: Neurosurgery;  Laterality: N/A;   THORACIC FUSION  07/26/2017   TONSILLECTOMY      There were no vitals filed for this visit.   Subjective Assessment - 11/17/21 1505     Subjective Patient reports she is a little tired today, but willing to try her best. She went shopping with her daughter and thinks this may be contributing.    Pertinent History Multiple back surgeries, recent neck fusion, 8/22, with therapy afterwards.    Limitations Sitting;Lifting;Standing;Walking;House hold activities  Currently in Pain? No/denies                               [facility] Adult PT Treatment/Exercise - 11/17/21 0001       Neck Exercises: Seated   Shoulder Shrugs 20 reps    Shoulder Shrugs Limitations 3#    Other Seated Exercise ER 2 x 10 reps red T band      Lumbar Exercises: Aerobic   Nustep L4 x 5 minutes.      Lumbar Exercises: Standing   Other Standing Lumbar Exercises Standing hip abd, 2 x 10 reps, 3#    Other Standing Lumbar Exercises Standing knee flexion with 3# weights, 2 x 10 reps.      Lumbar Exercises: Seated   Long Arc Quad on Chair Strengthening;Both;2 sets;15 reps;Weights    LAQ on Chair Weights (lbs) 3    Other Seated Lumbar Exercises Sitting with upright posture, hold 2# ball in BUE at chest height, hold upright posture while reaching out and  back 2 x 10 reps.                     PT Education - 11/17/21 1538     Education Details Updated HEP    Person(s) Educated Patient    Methods Explanation;Demonstration;Handout    Comprehension Returned demonstration;Verbalized understanding              PT Short Term Goals - 11/06/21 1654       PT SHORT TERM GOAL #1   Title I with initial HEP    Status Achieved               PT Long Term Goals - 10/29/21 1757       PT LONG TERM GOAL #1   Title independent with HEP and how to progress herself    Time 8    Period Weeks    Status New    Target Date 12/24/21      PT LONG TERM GOAL #2   Title Perform TUG in <10 sec to demosntrate imporved balance and confidence.    Baseline 12.92    Time 8    Period Weeks    Status New    Target Date 12/24/21      PT LONG TERM GOAL #3   Title Paitent will score at least 19/24 on DGI.    Baseline 15/24    Time 8    Period Weeks    Status New    Target Date 12/24/21      PT LONG TERM GOAL #4   Title Patient will be able to get through her day with pain </+3/10    Baseline Up to 8/10    Time 8    Period Weeks    Status New    Target Date 12/24/21                   Plan - 11/17/21 1509     Clinical Impression Statement Patient arrives feeling tired, but ready to work. Alternated upper and lower body exercises again to maximize her ability to participate. She tolerated all activities well. HEP updated to include some exercises with her cuff weights, which she found in her home.    PT Treatment/Interventions ADLs/Self Care Home Management;DME Instruction;Neuromuscular re-education;Manual techniques;Balance training;Therapeutic exercise;Therapeutic activities;Functional mobility training;Stair training;Gait training;Dry needling;Passive range of motion;Moist Heat;Iontophoresis 4mg /ml Dexamethasone    PT Next  Visit Plan total body strength and conditioning    PT Home Exercise Plan Access Code: [email or web address]   URL: [email or web address]  Date: 11/04/2021  Prepared by: Cheri Fowler    Exercises  Sit to Stand - 1 x daily - 7 x weekly - 3 sets - 10 reps  Standing Row with Resistance - 1 x daily - 7 x weekly - 3 sets - 10 reps  Standing Shoulder Extension with Resistance - 1 x daily - 7 x weekly - 3 sets - 10 reps             Patient will benefit from skilled therapeutic intervention in order to improve the following deficits and impairments:  Abnormal gait, Decreased range of motion, Difficulty walking, Pain, Decreased mobility, Decreased strength, Postural dysfunction, Improper body mechanics, Impaired flexibility, Increased muscle spasms  Visit Diagnosis: Radiculopathy, cervical region  Muscle weakness (generalized)  Unsteadiness on feet  Difficulty in walking, not elsewhere classified  Other lack of coordination     Problem List Patient Active Problem List   Diagnosis Date Noted   Hepatic cirrhosis ([place]) 06/26/2021   Fracture of C1 vertebra, closed ([place]) 06/09/2021   Closed C1 fracture ([place]) 06/05/2021   Plantar fasciitis of left foot 08/21/2018   Acute renal failure (ARF) ([place]) 02/09/2018   Chronic migraine without aura without status migrainosus, not intractable 06/16/2017   History of lumbar fusion 08/03/2016    Class: Chronic   Migraine 04/09/2016   Acute-on-chronic kidney injury ([place]) 09/07/2015   Hypertension 09/07/2015   Hypothyroidism 09/07/2015   Gout 09/07/2015   Low back pain 09/07/2015   Obesity (BMI 30-39.9) 07/06/2013   Headache 04/12/2013   Concussion 04/12/2013   Lumbar spinal stenosis 03/04/2012    Class: Diagnosis of    Marcelina Morel, DPT 11/17/2021, 3:43 PM  [place]. [place], [place], [address] Phone: [phone]   Fax:  [phone]  Name: Becky Hobbs MRN: [id number] Date of Birth: 10-05-1946

## 2021-11-19 ENCOUNTER — Encounter: Payer: Self-pay | Admitting: Physical Therapy

## 2021-11-19 ENCOUNTER — Ambulatory Visit: Payer: Medicare Other | Admitting: Physical Therapy

## 2021-11-19 ENCOUNTER — Other Ambulatory Visit: Payer: Self-pay

## 2021-11-19 DIAGNOSIS — M6281 Muscle weakness (generalized): Secondary | ICD-10-CM

## 2021-11-19 DIAGNOSIS — M5412 Radiculopathy, cervical region: Secondary | ICD-10-CM

## 2021-11-19 DIAGNOSIS — R2681 Unsteadiness on feet: Secondary | ICD-10-CM

## 2021-11-19 DIAGNOSIS — R278 Other lack of coordination: Secondary | ICD-10-CM

## 2021-11-19 DIAGNOSIS — R262 Difficulty in walking, not elsewhere classified: Secondary | ICD-10-CM

## 2021-11-19 NOTE — Therapy (Signed)
[place]. [place], [place], [address] Phone: [phone]   Fax:  [phone]  Physical Therapy Treatment  Patient Details  Name: Becky Hobbs MRN: [id number] Date of Birth: [birth year] Referring Provider (PT): Lovorn   Encounter Date: 11/19/2021   PT End of Session - 11/19/21 1702     Visit Number 5    Number of Visits 16    Date for PT Re-Evaluation 12/24/21    PT Start Time 1629    PT Stop Time 1707    PT Time Calculation (min) 38 min    Activity Tolerance Patient tolerated treatment well    Behavior During Therapy [facility] for tasks assessed/performed             Past Medical History:  Diagnosis Date   Anemia    Anxiety    panic attacks   Arthritis    Asthma    Blood transfusion    1973--with twins birth   Bronchitis    Chronic kidney disease    Diabetes mellitus    borderline   GERD (gastroesophageal reflux disease)    H/O hiatal hernia    surgery fixed   Headache    migraines   Heart murmur    MVP   Hepatitis    Hep C   Hypertension    Hypothyroidism    Pneumonia     Past Surgical History:  Procedure Laterality Date   ABDOMINAL HYSTERECTOMY     APPENDECTOMY     APPLICATION OF INTRAOPERATIVE CT SCAN N/A 06/05/2021   Procedure: APPLICATION OF INTRAOPERATIVE CT SCAN;  Surgeon: Karsten Ro, DO;  Location: [place];  Service: Neurosurgery;  Laterality: N/A;   BACK SURGERY     bladder tack     BREAST CYST EXCISION     BREAST CYST INCISION AND DRAINAGE     BREAST SURGERY     BUNIONECTOMY     CHOLECYSTECTOMY     DILATION AND CURETTAGE OF UTERUS     ESOPHAGOGASTRODUODENOSCOPY (EGD) WITH PROPOFOL N/A 01/03/2019   Procedure: ESOPHAGOGASTRODUODENOSCOPY (EGD) WITH PROPOFOL;  Surgeon: Otis Brace, MD;  Location: [facility];  Service: Gastroenterology;  Laterality: N/A;   FOREIGN BODY REMOVAL  01/03/2019   Procedure: FOREIGN BODY REMOVAL;  Surgeon: Otis Brace, MD;  Location:  [facility];  Service: Gastroenterology;;   FRACTURE SURGERY     left wrist   HERNIA REPAIR     hiatal hernia   LUMBAR FUSION  07/26/2017   LUMBAR LAMINECTOMY  03/04/2012   Procedure: MICRODISCECTOMY LUMBAR LAMINECTOMY;  Surgeon: Jessy Oto, MD;  Location: [facility];  Service: Orthopedics;  Laterality: N/A;  L3-4 central laminectomy   NASAL SEPTUM SURGERY     nissan fundoplication     OVARIAN CYST SURGERY     POSTERIOR CERVICAL FUSION/FORAMINOTOMY N/A 06/05/2021   Procedure: OCCIPITAL- CERVICAL TWO INSTRUMENTATION AND FUSION; EXTENSION TO CERVICAL FIVE;  Surgeon: Karsten Ro, DO;  Location: [facility] [facility];  Service: Neurosurgery;  Laterality: N/A;   THORACIC FUSION  07/26/2017   TONSILLECTOMY      There were no vitals filed for this visit.   Subjective Assessment - 11/19/21 1635     Subjective Patient reports her energy is better today, her low back is slightly sore.    Pertinent History Multiple back surgeries, recent neck fusion, 8/22, with therapy afterwards.    Limitations Sitting;Lifting;Standing;Walking;House hold activities    How long can you sit comfortably? 20 minutes  Patient Stated Goals Strengthen neck and LE, improve balance.    Currently in Pain? Yes    Pain Score 4     Pain Location Back    Pain Orientation Lower;Medial    Pain Descriptors / Indicators Aching    Pain Type Chronic pain    Pain Onset More than a month ago    Pain Frequency Intermittent    Pain Relieving Factors Advil                               [facility] Adult PT Treatment/Exercise - 11/19/21 0001       Neck Exercises: Machines for Strengthening   Lat Pull 15#  10 reps      Lumbar Exercises: Aerobic   Nustep L4 x 5 minutes.                       PT Short Term Goals - 11/06/21 1654       PT SHORT TERM GOAL #1   Title I with initial HEP    Status Achieved               PT Long Term Goals - 11/19/21 1705       PT LONG TERM GOAL #1   Title  independent with HEP and how to progress herself    Time 8    Period Weeks    Status New    Target Date 12/24/21      PT LONG TERM GOAL #2   Title Perform TUG in <10 sec to demosntrate imporved balance and confidence.    Baseline 12.92    Time 8    Period Weeks    Status New    Target Date 12/24/21      PT LONG TERM GOAL #3   Title Paitent will score at least 19/24 on DGI.    Baseline 15/24    Time 8    Period Weeks    Status New    Target Date 12/24/21      PT LONG TERM GOAL #4   Title Patient will be able to get through her day with pain </+3/10    Baseline Up to 7/10    Time 6    Period Weeks    Status On-going    Target Date 12/24/21                   Plan - 11/19/21 1654     Clinical Impression Statement Patient reports less fatigue today. Continued with treatment pattern of Upper body exercise, then lower body exercise in order to allow her to [facility] the entire session.    Personal Factors and Comorbidities Past/Current Experience;Comorbidity 1    Comorbidities multiple back surgeries.    Examination-Activity Limitations Locomotion Level;Reach Overhead;Bend;Sit;Sleep;Carry;Squat;Stairs;Lift    Examination-Participation Restrictions Cleaning;Laundry    Stability/Clinical Decision Making Stable/Uncomplicated    Clinical Decision Making Low    Rehab Potential Good    PT Frequency 2x / week    PT Duration 6 weeks    PT Treatment/Interventions ADLs/Self Care Home Management;DME Instruction;Neuromuscular re-education;Manual techniques;Balance training;Therapeutic exercise;Therapeutic activities;Functional mobility training;Stair training;Gait training;Dry needling;Passive range of motion;Moist Heat;Iontophoresis 4mg /ml Dexamethasone    PT Next Visit Plan Patient would like to try the elliptical as she has one at her home.    PT Home Exercise Plan Access Code: [email or web address]  URL: [email or web address]  Date: 11/04/2021  Prepared by: Jori Moll  [place]     Exercises  Sit to Stand - 1 x daily - 7 x weekly - 3 sets - 10 reps  Standing Row with Resistance - 1 x daily - 7 x weekly - 3 sets - 10 reps  Standing Shoulder Extension with Resistance - 1 x daily - 7 x weekly - 3 sets - 10 reps    Consulted and Agree with Plan of Care Patient             Patient will benefit from skilled therapeutic intervention in order to improve the following deficits and impairments:  Abnormal gait, Decreased range of motion, Difficulty walking, Pain, Decreased mobility, Decreased strength, Postural dysfunction, Improper body mechanics, Impaired flexibility, Increased muscle spasms  Visit Diagnosis: Radiculopathy, cervical region  Muscle weakness (generalized)  Unsteadiness on feet  Difficulty in walking, not elsewhere classified  Other lack of coordination     Problem List Patient Active Problem List   Diagnosis Date Noted   Hepatic cirrhosis ([place]) 06/26/2021   Fracture of C1 vertebra, closed ([place]) 06/09/2021   Closed C1 fracture ([place]) 06/05/2021   Plantar fasciitis of left foot 08/21/2018   Acute renal failure (ARF) ([place]) 02/09/2018   Chronic migraine without aura without status migrainosus, not intractable 06/16/2017   History of lumbar fusion 08/03/2016    Class: Chronic   Migraine 04/09/2016   Acute-on-chronic kidney injury ([place]) 09/07/2015   Hypertension 09/07/2015   Hypothyroidism 09/07/2015   Gout 09/07/2015   Low back pain 09/07/2015   Obesity (BMI 30-39.9) 07/06/2013   Headache 04/12/2013   Concussion 04/12/2013   Lumbar spinal stenosis 03/04/2012    Class: Diagnosis of    Marcelina Morel, DPT 11/19/2021, 5:09 PM  [place]. [place], [place], [address] Phone: [phone]   Fax:  [phone]  Name: Becky Hobbs MRN: [id number] Date of Birth: 11-02-1945

## 2021-11-24 ENCOUNTER — Ambulatory Visit: Payer: Medicare Other | Admitting: Physical Therapy

## 2021-11-26 ENCOUNTER — Other Ambulatory Visit: Payer: Self-pay

## 2021-11-26 ENCOUNTER — Encounter: Payer: Self-pay | Admitting: Physical Therapy

## 2021-11-26 ENCOUNTER — Ambulatory Visit: Payer: Medicare Other | Admitting: Physical Therapy

## 2021-11-26 DIAGNOSIS — M6281 Muscle weakness (generalized): Secondary | ICD-10-CM

## 2021-11-26 DIAGNOSIS — R2681 Unsteadiness on feet: Secondary | ICD-10-CM

## 2021-11-26 DIAGNOSIS — R278 Other lack of coordination: Secondary | ICD-10-CM

## 2021-11-26 DIAGNOSIS — M5412 Radiculopathy, cervical region: Secondary | ICD-10-CM | POA: Diagnosis not present

## 2021-11-26 DIAGNOSIS — R262 Difficulty in walking, not elsewhere classified: Secondary | ICD-10-CM

## 2021-11-26 NOTE — Therapy (Signed)
[place]. [place], [place], [address] Phone: [phone]   Fax:  [phone]  Physical Therapy Treatment  Patient Details  Name: Becky Hobbs MRN: [id number] Date of Birth: 1946-03-23 Referring Provider (PT): Lovorn   Encounter Date: 11/26/2021   PT End of Session - 11/26/21 1538     Visit Number 6    Number of Visits 16    Date for PT Re-Evaluation 12/24/21    PT Start Time 1510    PT Stop Time 1543    PT Time Calculation (min) 33 min    Activity Tolerance Patient tolerated treatment well    Behavior During Therapy [facility] for tasks assessed/performed             Past Medical History:  Diagnosis Date   Anemia    Anxiety    panic attacks   Arthritis    Asthma    Blood transfusion    1973--with twins birth   Bronchitis    Chronic kidney disease    Diabetes mellitus    borderline   GERD (gastroesophageal reflux disease)    H/O hiatal hernia    surgery fixed   Headache    migraines   Heart murmur    MVP   Hepatitis    Hep C   Hypertension    Hypothyroidism    Pneumonia     Past Surgical History:  Procedure Laterality Date   ABDOMINAL HYSTERECTOMY     APPENDECTOMY     APPLICATION OF INTRAOPERATIVE CT SCAN N/A 06/05/2021   Procedure: APPLICATION OF INTRAOPERATIVE CT SCAN;  Surgeon: Karsten Ro, DO;  Location: [place];  Service: Neurosurgery;  Laterality: N/A;   BACK SURGERY     bladder tack     BREAST CYST EXCISION     BREAST CYST INCISION AND DRAINAGE     BREAST SURGERY     BUNIONECTOMY     CHOLECYSTECTOMY     DILATION AND CURETTAGE OF UTERUS     ESOPHAGOGASTRODUODENOSCOPY (EGD) WITH PROPOFOL N/A 01/03/2019   Procedure: ESOPHAGOGASTRODUODENOSCOPY (EGD) WITH PROPOFOL;  Surgeon: Otis Brace, MD;  Location: [place];  Service: Gastroenterology;  Laterality: N/A;   FOREIGN BODY REMOVAL  01/03/2019   Procedure: FOREIGN BODY REMOVAL;  Surgeon: Otis Brace, MD;  Location:  [facility];  Service: Gastroenterology;;   FRACTURE SURGERY     left wrist   HERNIA REPAIR     hiatal hernia   LUMBAR FUSION  07/26/2017   LUMBAR LAMINECTOMY  03/04/2012   Procedure: MICRODISCECTOMY LUMBAR LAMINECTOMY;  Surgeon: Jessy Oto, MD;  Location: [facility];  Service: Orthopedics;  Laterality: N/A;  L3-4 central laminectomy   NASAL SEPTUM SURGERY     nissan fundoplication     OVARIAN CYST SURGERY     POSTERIOR CERVICAL FUSION/FORAMINOTOMY N/A 06/05/2021   Procedure: OCCIPITAL- CERVICAL TWO INSTRUMENTATION AND FUSION; EXTENSION TO CERVICAL FIVE;  Surgeon: Karsten Ro, DO;  Location: [facility];  Service: Neurosurgery;  Laterality: N/A;   THORACIC FUSION  07/26/2017   TONSILLECTOMY      There were no vitals filed for this visit.   Subjective Assessment - 11/26/21 1516     Subjective Patient states that she felt more weak last week. She things she still feels weaker than before.    Pertinent History Multiple back surgeries, recent neck fusion, 8/22, with therapy afterwards.    Limitations Sitting;Lifting;Standing;Walking;House hold activities    Currently in Pain? No/denies  [facility] Adult PT Treatment/Exercise - 11/26/21 0001       Lumbar Exercises: Aerobic   Nustep L5 x 5 minutes.      Lumbar Exercises: Standing   Heel Raises 10 reps;1 second    Heel Raises Limitations B HHA    Row Strengthening;Both;20 reps;Theraband    Theraband Level (Row) Level 2 (Red)    Shoulder Extension Strengthening;Both;20 reps;Theraband    Theraband Level (Shoulder Extension) Level 2 (Red)    Other Standing Lumbar Exercises Standing side to side step x 10 against yellow Tband.      Lumbar Exercises: Seated   Long Arc Quad on Chair Strengthening;Both;2 sets;15 reps;Weights    LAQ on Chair Weights (lbs) 3                       PT Short Term Goals - 11/06/21 1654       PT SHORT TERM GOAL #1   Title I with initial HEP     Status Achieved               PT Long Term Goals - 11/26/21 1544       PT LONG TERM GOAL #4   Title Patient will be able to get through her day with pain </+3/10    Baseline 3/10 max    Status Achieved                   Plan - 11/26/21 1539     Clinical Impression Statement Patient appeared mildly unsteady today. She participated well. Arrived 10 minutes late, so treatment modified. continued to move upper body/lower body to increase tolerance to activity.    Personal Factors and Comorbidities Past/Current Experience;Comorbidity 1    Comorbidities multiple back surgeries.    Examination-Activity Limitations Locomotion Level;Reach Overhead;Bend;Sit;Sleep;Carry;Squat;Stairs;Lift    Examination-Participation Restrictions Cleaning;Laundry    Stability/Clinical Decision Making Stable/Uncomplicated    Clinical Decision Making Low    Rehab Potential Good    PT Frequency 2x / week    PT Duration 6 weeks    PT Treatment/Interventions ADLs/Self Care Home Management;DME Instruction;Neuromuscular re-education;Manual techniques;Balance training;Therapeutic exercise;Therapeutic activities;Functional mobility training;Stair training;Gait training;Dry needling;Passive range of motion;Moist Heat;Iontophoresis 4mg /ml Dexamethasone    PT Next Visit Plan Patient would like to try the elliptical as she has one at her home.    PT Home Exercise Plan Access Code: [email or web address]  URL: [email or web address]  Date: 11/04/2021  Prepared by: Cheri Fowler    Exercises  Sit to Stand - 1 x daily - 7 x weekly - 3 sets - 10 reps  Standing Row with Resistance - 1 x daily - 7 x weekly - 3 sets - 10 reps  Standing Shoulder Extension with Resistance - 1 x daily - 7 x weekly - 3 sets - 10 reps    Consulted and Agree with Plan of Care Patient             Patient will benefit from skilled therapeutic intervention in order to improve the following deficits and impairments:  Abnormal gait,  Decreased range of motion, Difficulty walking, Pain, Decreased mobility, Decreased strength, Postural dysfunction, Improper body mechanics, Impaired flexibility, Increased muscle spasms  Visit Diagnosis: Radiculopathy, cervical region  Muscle weakness (generalized)  Unsteadiness on feet  Difficulty in walking, not elsewhere classified  Other lack of coordination     Problem List Patient Active Problem List   Diagnosis Date Noted   Hepatic cirrhosis ([place]) 06/26/2021   Fracture of C1 vertebra,  closed ([place]) 06/09/2021   Closed C1 fracture ([place]) 06/05/2021   Plantar fasciitis of left foot 08/21/2018   Acute renal failure (ARF) ([place]) 02/09/2018   Chronic migraine without aura without status migrainosus, not intractable 06/16/2017   History of lumbar fusion 08/03/2016    Class: Chronic   Migraine 04/09/2016   Acute-on-chronic kidney injury ([place]) 09/07/2015   Hypertension 09/07/2015   Hypothyroidism 09/07/2015   Gout 09/07/2015   Low back pain 09/07/2015   Obesity (BMI 30-39.9) 07/06/2013   Headache 04/12/2013   Concussion 04/12/2013   Lumbar spinal stenosis 03/04/2012    Class: Diagnosis of    Marcelina Morel, DPT 11/26/2021, 3:45 PM  [place]. [place], [place], [address] Phone: [phone]   Fax:  [phone]  Name: Becky Hobbs MRN: [id number] Date of Birth: 04/16/1946

## 2021-12-03 ENCOUNTER — Ambulatory Visit: Payer: Medicare Other | Admitting: Physical Therapy

## 2021-12-04 ENCOUNTER — Ambulatory Visit: Payer: Medicare Other | Admitting: Physical Therapy

## 2021-12-04 ENCOUNTER — Encounter: Payer: Self-pay | Admitting: Physical Therapy

## 2021-12-04 ENCOUNTER — Other Ambulatory Visit: Payer: Self-pay

## 2021-12-04 DIAGNOSIS — R262 Difficulty in walking, not elsewhere classified: Secondary | ICD-10-CM

## 2021-12-04 DIAGNOSIS — M5412 Radiculopathy, cervical region: Secondary | ICD-10-CM

## 2021-12-04 DIAGNOSIS — M6281 Muscle weakness (generalized): Secondary | ICD-10-CM

## 2021-12-04 DIAGNOSIS — R2681 Unsteadiness on feet: Secondary | ICD-10-CM

## 2021-12-04 DIAGNOSIS — G8929 Other chronic pain: Secondary | ICD-10-CM

## 2021-12-04 DIAGNOSIS — R278 Other lack of coordination: Secondary | ICD-10-CM

## 2021-12-04 NOTE — Therapy (Signed)
[place]. [place], [place], [address] Phone: [phone]   Fax:  [phone]  Physical Therapy Treatment  Patient Details  Name: Becky Hobbs MRN: [id number] Date of Birth: 09/28/1946 Referring Provider (PT): Lovorn   Encounter Date: 12/04/2021   PT End of Session - 12/04/21 1353     Visit Number 7    Date for PT Re-Evaluation 12/24/21    PT Start Time 1315    PT Stop Time 1355    PT Time Calculation (min) 40 min    Activity Tolerance Patient tolerated treatment well    Behavior During Therapy [facility] for tasks assessed/performed             Past Medical History:  Diagnosis Date   Anemia    Anxiety    panic attacks   Arthritis    Asthma    Blood transfusion    1973--with twins birth   Bronchitis    Chronic kidney disease    Diabetes mellitus    borderline   GERD (gastroesophageal reflux disease)    H/O hiatal hernia    surgery fixed   Headache    migraines   Heart murmur    MVP   Hepatitis    Hep C   Hypertension    Hypothyroidism    Pneumonia     Past Surgical History:  Procedure Laterality Date   ABDOMINAL HYSTERECTOMY     APPENDECTOMY     APPLICATION OF INTRAOPERATIVE CT SCAN N/A 06/05/2021   Procedure: APPLICATION OF INTRAOPERATIVE CT SCAN;  Surgeon: Karsten Ro, DO;  Location: [facility];  Service: Neurosurgery;  Laterality: N/A;   BACK SURGERY     bladder tack     BREAST CYST EXCISION     BREAST CYST INCISION AND DRAINAGE     BREAST SURGERY     BUNIONECTOMY     CHOLECYSTECTOMY     DILATION AND CURETTAGE OF UTERUS     ESOPHAGOGASTRODUODENOSCOPY (EGD) WITH PROPOFOL N/A 01/03/2019   Procedure: ESOPHAGOGASTRODUODENOSCOPY (EGD) WITH PROPOFOL;  Surgeon: Otis Brace, MD;  Location: [place] [facility];  Service: Gastroenterology;  Laterality: N/A;   FOREIGN BODY REMOVAL  01/03/2019   Procedure: FOREIGN BODY REMOVAL;  Surgeon: Otis Brace, MD;  Location: [facility];  Service:  Gastroenterology;;   FRACTURE SURGERY     left wrist   HERNIA REPAIR     hiatal hernia   LUMBAR FUSION  07/26/2017   LUMBAR LAMINECTOMY  03/04/2012   Procedure: MICRODISCECTOMY LUMBAR LAMINECTOMY;  Surgeon: Jessy Oto, MD;  Location: [facility];  Service: Orthopedics;  Laterality: N/A;  L3-4 central laminectomy   NASAL SEPTUM SURGERY     nissan fundoplication     OVARIAN CYST SURGERY     POSTERIOR CERVICAL FUSION/FORAMINOTOMY N/A 06/05/2021   Procedure: OCCIPITAL- CERVICAL TWO INSTRUMENTATION AND FUSION; EXTENSION TO CERVICAL FIVE;  Surgeon: Karsten Ro, DO;  Location: [facility];  Service: Neurosurgery;  Laterality: N/A;   THORACIC FUSION  07/26/2017   TONSILLECTOMY      There were no vitals filed for this visit.   Subjective Assessment - 12/04/21 1318     Subjective Patient reports that after getting out of the hospital, her Dr instructed her to stop her Potassium. After feeling poorly she went to another Dr who did blood work and put her back on the Potassium. She is feeling better now.    Pertinent History Multiple back surgeries, recent neck fusion, 8/22, with therapy afterwards.  Currently in Pain? No/denies                               [facility] Adult PT Treatment/Exercise - 12/04/21 0001       Neck Exercises: Machines for Strengthening   Cybex Row 15#, 10 reps      Lumbar Exercises: Stretches   Other Lumbar Stretch Exercise Seated rotation, place both hands on mat/bed to L, stretching R thoracic trunk, perform weight shifts x 4, repeat to R to stretch L side.      Lumbar Exercises: Aerobic   Nustep L5 x 5 minutes      Lumbar Exercises: Standing   Heel Raises 10 reps;1 second    Other Standing Lumbar Exercises Side stepping over a row of cones, 6" high. Very unsteady, required up to mod A for balance, 5 times each way, improved with repetition.      Lumbar Exercises: Seated   Other Seated Lumbar Exercises Sitting with upright posture, hold 2# ball in  BUE at chest height, hold upright posture while reaching out and back 2 x 10 reps.                     PT Education - 12/04/21 1344     Education Details Patient encouraged to pay close attention to how she feels during exercise as she tends to push too hard. Also cautined not to try side stepping over objects at home due to fall risk.    Person(s) Educated Patient    Methods Explanation    Comprehension Verbalized understanding              PT Short Term Goals - 11/06/21 1654       PT SHORT TERM GOAL #1   Title I with initial HEP    Status Achieved               PT Long Term Goals - 12/04/21 1350       PT LONG TERM GOAL #1   Title independent with HEP and how to progress herself    Time 6    Period Weeks    Status On-going    Target Date 12/24/21      PT LONG TERM GOAL #4   Title Patient will be able to get through her day with pain </+3/10    Baseline 3/10 max    Status Achieved                   Plan - 12/04/21 1346     Clinical Impression Statement Patient returns after missing several treatments due to illness, Dr appointments. She gets very frustrated with herself when unable to complete a task to her high standards. Therapsit reminded her that the tasks are chosen in order to be challenging, and that she is demosntrating excellent effort. Encouraged to be kind to herself. She performed alternating upper and lower body exercises with rest breaks in between.    Personal Factors and Comorbidities Past/Current Experience;Comorbidity 1    Comorbidities multiple back surgeries.    Examination-Activity Limitations Locomotion Level;Reach Overhead;Bend;Sit;Sleep;Carry;Squat;Stairs;Lift    Examination-Participation Restrictions Cleaning;Laundry    Stability/Clinical Decision Making Stable/Uncomplicated    Rehab Potential Good    PT Frequency 2x / week    PT Duration 6 weeks    PT Treatment/Interventions ADLs/Self Care Home Management;DME  Instruction;Neuromuscular re-education;Manual techniques;Balance training;Therapeutic exercise;Therapeutic activities;Functional mobility training;Stair training;Gait training;Dry needling;Passive range  of motion;Moist Heat;Iontophoresis 4mg /ml Dexamethasone    PT Next Visit Plan Patient would like to try the elliptical as she has one at her home.    PT Home Exercise Plan Access Code: [email or web address]  URL: [email or web address]  Date: 11/04/2021  Prepared by: Cheri Fowler    Exercises  Sit to Stand - 1 x daily - 7 x weekly - 3 sets - 10 reps  Standing Row with Resistance - 1 x daily - 7 x weekly - 3 sets - 10 reps  Standing Shoulder Extension with Resistance - 1 x daily - 7 x weekly - 3 sets - 10 reps    Consulted and Agree with Plan of Care Patient             Patient will benefit from skilled therapeutic intervention in order to improve the following deficits and impairments:  Abnormal gait, Decreased range of motion, Difficulty walking, Pain, Decreased mobility, Decreased strength, Postural dysfunction, Improper body mechanics, Impaired flexibility, Increased muscle spasms  Visit Diagnosis: Radiculopathy, cervical region  Muscle weakness (generalized)  Unsteadiness on feet  Difficulty in walking, not elsewhere classified  Other lack of coordination  Chronic right-sided low back pain with left-sided sciatica     Problem List Patient Active Problem List   Diagnosis Date Noted   Hepatic cirrhosis ([place]) 06/26/2021   Fracture of C1 vertebra, closed ([place]) 06/09/2021   Closed C1 fracture ([place]) 06/05/2021   Plantar fasciitis of left foot 08/21/2018   Acute renal failure (ARF) ([place]) 02/09/2018   Chronic migraine without aura without status migrainosus, not intractable 06/16/2017   History of lumbar fusion 08/03/2016    Class: Chronic   Migraine 04/09/2016   Acute-on-chronic kidney injury ([place]) 09/07/2015   Hypertension 09/07/2015   Hypothyroidism 09/07/2015   Gout  09/07/2015   Low back pain 09/07/2015   Obesity (BMI 30-39.9) 07/06/2013   Headache 04/12/2013   Concussion 04/12/2013   Lumbar spinal stenosis 03/04/2012    Class: Diagnosis of    Marcelina Morel, DPT 12/04/2021, 1:56 PM  [place]. [place], [place], [address] Phone: [phone]   Fax:  [phone]  Name: Becky Hobbs MRN: [id number] Date of Birth: 1946-07-08

## 2021-12-08 ENCOUNTER — Encounter: Payer: Self-pay | Admitting: Physical Therapy

## 2021-12-08 ENCOUNTER — Ambulatory Visit: Payer: Medicare Other | Admitting: Physical Therapy

## 2021-12-08 ENCOUNTER — Other Ambulatory Visit: Payer: Self-pay

## 2021-12-08 ENCOUNTER — Encounter: Payer: Self-pay | Admitting: Podiatrist

## 2021-12-08 ENCOUNTER — Ambulatory Visit: Payer: Medicare Other | Admitting: Podiatrist

## 2021-12-08 DIAGNOSIS — R278 Other lack of coordination: Secondary | ICD-10-CM

## 2021-12-08 DIAGNOSIS — M5412 Radiculopathy, cervical region: Secondary | ICD-10-CM

## 2021-12-08 DIAGNOSIS — L089 Local infection of the skin and subcutaneous tissue, unspecified: Secondary | ICD-10-CM

## 2021-12-08 DIAGNOSIS — R262 Difficulty in walking, not elsewhere classified: Secondary | ICD-10-CM

## 2021-12-08 DIAGNOSIS — L89891 Pressure ulcer of other site, stage 1: Secondary | ICD-10-CM | POA: Diagnosis not present

## 2021-12-08 DIAGNOSIS — R2681 Unsteadiness on feet: Secondary | ICD-10-CM

## 2021-12-08 DIAGNOSIS — M6281 Muscle weakness (generalized): Secondary | ICD-10-CM

## 2021-12-08 MED ORDER — MUPIROCIN 2 % EX OINT: 1.0000 "application " | TOPICAL_OINTMENT | Freq: Two times a day (BID) | CUTANEOUS | 2 refills | Status: DC

## 2021-12-08 MED ORDER — CLINDAMYCIN HCL 300 MG PO CAPS: 300.0000 mg | ORAL_CAPSULE | Freq: Three times a day (TID) | ORAL | 0 refills | Status: DC

## 2021-12-08 NOTE — Progress Notes (Signed)
Chief Complaint  Patient presents with   Nail Problem    Bilateral hallux nail causing pain and discomfort      HPI: Patient is 76 y.o. female who presents today for pain at the tip of her great toes left greater than right.  She relates she was treated to a pedicure where they put a gel type of nail on her great toenails. She states the whole toenail on the left great toe fell off about 2 weeks ago and this is when her pain began. She noticed the toe become red and painful especially when she puts any pressure on the tip of the toe.   Patient Active Problem List   Diagnosis Date Noted   Hepatic cirrhosis ([place]) 06/26/2021   Fracture of C1 vertebra, closed ([place]) 06/09/2021   Closed C1 fracture ([place]) 06/05/2021   Plantar fasciitis of left foot 08/21/2018   Acute renal failure (ARF) ([place]) 02/09/2018   Chronic migraine without aura without status migrainosus, not intractable 06/16/2017   History of lumbar fusion 08/03/2016    Class: Chronic   Migraine 04/09/2016   Acute-on-chronic kidney injury ([place]) 09/07/2015   Hypertension 09/07/2015   Hypothyroidism 09/07/2015   Gout 09/07/2015   Low back pain 09/07/2015   Obesity (BMI 30-39.9) 07/06/2013   Headache 04/12/2013   Concussion 04/12/2013   Lumbar spinal stenosis 03/04/2012    Class: Diagnosis of    Current Outpatient Medications on File Prior to Visit  Medication Sig Dispense Refill   acetaminophen (TYLENOL) 325 MG tablet Take 1-2 tablets (325-650 mg total) by mouth every 4 (four) hours as needed for mild pain.     acidophilus (RISAQUAD) CAPS capsule Take 1 capsule by mouth daily. 30 capsule 0   albuterol (VENTOLIN HFA) 108 (90 Base) MCG/ACT inhaler Inhale 2 puffs into the lungs every 6 (six) hours as needed for wheezing or shortness of breath. 18 g 6   allopurinol (ZYLOPRIM) 100 MG tablet Take 1 tablet (100 mg total) by mouth daily. 30 tablet 0   ALPRAZolam (XANAX) 0.5 MG tablet Take 1 tablet (0.5 mg total) by mouth at bedtime. 30  tablet 0   bismuth subsalicylate (PEPTO BISMOL) 262 MG/15ML suspension Take 30 mLs by mouth every 6 (six) hours as needed for indigestion or diarrhea or loose stools.     Cholecalciferol 125 MCG (5000 UT) TABS Take 1 tablet (5,000 Units total) by mouth daily. 30 tablet 0   clotrimazole-betamethasone (LOTRISONE) cream Apply 1 application topically 2 (two) times daily.     Coenzyme Q10 (CO Q-10) 200 MG CAPS Take 200 mg by mouth daily.     fluticasone (FLONASE) 50 MCG/ACT nasal spray Place 2 sprays into both nostrils in the morning and at bedtime.  5   fluticasone furoate-vilanterol (BREO ELLIPTA) 100-25 MCG/INH AEPB Inhale 1 puff into the lungs at bedtime. 60 each 0   gabapentin (NEURONTIN) 300 MG capsule Take 1 capsule (300 mg total) by mouth 2 (two) times daily. 60 capsule 0   levothyroxine (SYNTHROID) 100 MCG tablet Take 1 tablet (100 mcg total) by mouth daily before breakfast. 30 tablet 0   lidocaine (LIDODERM) 5 % Place 2 patches onto the skin daily. Remove & Discard patch within 12 hours or as directed by MD 30 patch 0   loratadine (CLARITIN) 10 MG tablet Take 10 mg by mouth daily.     metoprolol succinate (TOPROL-XL) 25 MG 24 hr tablet Take 7 tablets (175 mg total) by mouth daily. 320 tablet 0  mirabegron ER (MYRBETRIQ) 50 MG TB24 tablet Take 1 tablet (50 mg total) by mouth daily. 30 tablet 0   Multiple Vitamin (MULITIVITAMIN WITH MINERALS) TABS Take 1 tablet by mouth daily.     oxyCODONE (OXY IR/ROXICODONE) 5 MG immediate release tablet Take 1 tablet (5 mg total) by mouth every 4 (four) hours as needed for severe pain. 30 tablet 0   pantoprazole (PROTONIX) 40 MG tablet Take 1 tablet (40 mg total) by mouth daily. 30 tablet 0   Polyethyl Glycol-Propyl Glycol (SYSTANE) 0.4-0.3 % GEL ophthalmic gel Place 1 application into both eyes at bedtime.     rOPINIRole (REQUIP) 1 MG tablet Take 1/2 tablet (0.5 mg total) by mouth 3 (three) times daily as needed (cramping). 45 tablet 0   rosuvastatin  (CRESTOR) 10 MG tablet Take 1 tablet (10 mg total) by mouth daily. 30 tablet 0   SUMAtriptan (IMITREX) 100 MG tablet Take 1 tablet (100 mg total) by mouth every 2 (two) hours as needed for migraine or headache. May repeat in 2 hours if headache persists or recurs. 10 tablet 0   tiZANidine (ZANAFLEX) 2 MG tablet Take 1 tablet (2 mg total) by mouth 3 (three) times daily. 90 tablet 0   topiramate (TOPAMAX) 25 MG tablet Take 1 tablet (25 mg total) by mouth at bedtime. Then increase to 50 mg nightly- for headache prevention 60 tablet 5   torsemide (DEMADEX) 10 MG tablet Take 1 tablet (10 mg total) by mouth daily. 30 tablet 0   traZODone (DESYREL) 50 MG tablet Take 0.5-1 tablets (25-50 mg total) by mouth at bedtime as needed for sleep. (Patient taking differently: Take 100 mg by mouth at bedtime as needed for sleep.) 10 tablet 0   TRULICITY 1.5 XQ/1.1HE SOPN Inject 1.5 mg as directed every Sunday. 2 mL 11   vitamin B-12 (CYANOCOBALAMIN) 1000 MCG tablet Take 2.5 tablets (2,500 mcg total) by mouth daily. 30 tablet 0   No current facility-administered medications on file prior to visit.    Allergies  Allergen Reactions   Bee Venom Anaphylaxis    Other reaction(s): Unknown   Buprenorphine Hcl Hives and Itching   Erythromycin Hives and Itching    Other reaction(s): rash   Other Anaphylaxis    bees   Penicillins Hives    Has patient had a PCN reaction causing immediate rash, facial/tongue/throat swelling, SOB or lightheadedness with hypotension: No Has patient had a PCN reaction causing severe rash involving mucus membranes or skin necrosis: No Has patient had a PCN reaction that required hospitalization: No Has patient had a PCN reaction occurring within the last 10 years: No If all of the above answers are "NO", then may proceed with Cephalosporin use.   Hydrocodone Itching   Morphine And Related Hives and Itching    Hives (moderate severity)   Tramadol Hcl Itching   Tramadol Hcl Itching    Ciprofloxacin     Other reaction(s): itching   Trimethoprim     Other reaction(s): Increased CK   Hydrocodone-Acetaminophen Itching    Pt says it causes hallucinations    Review of Systems No fevers, chills, nausea, muscle aches, no difficulty breathing, no calf pain, no chest pain or shortness of breath.   Physical Exam  GENERAL APPEARANCE: Alert, conversant. Appropriately groomed. No acute distress.   VASCULAR: Pedal pulses palpable DP and PT bilateral.  Capillary refill time is immediate to all digits,  Proximal to distal cooling it warm to warm.  Digital perfusion adequate.  NEUROLOGIC: sensation is intact to 5.07 monofilament at 5/5 sites bilateral.  Light touch is intact bilateral, vibratory sensation intact bilateral  MUSCULOSKELETAL: acceptable muscle strength, tone and stability bilateral.  Mild HAV deformity present bilateral with flexible digital contractures bilateral.    DERMATOLOGIC: skin is warm, supple, and dry.  Redness and some swelling is present at the tip of the left great toe.  The toenail itself is not infected or ingrown. It does appear thick and mycotic as do the remainder of the nails.  Pain with pressure at the end of the toe is noted.  Skin is intact, no pus or fluid filled area is present.    Assessment     ICD-10-CM   1. Infection of skin of toes  L08.9     2. Pressure injury of toe of left foot, stage 1  L89.891          Plan  Discussed exam findings with the patient.  She does have some redness at the tip of the left hallux which could be cellulitis therefore I recommended a round of Clindamycin. This was called into her pharmacy.  I also called in mupirocin ointment to use as well. She will soak in epsom salts and keep pressure off the end of the toes for the next 2 weeks.  She will be seen back in 2 weeks for a recheck and will call sooner if any concerns arise.

## 2021-12-08 NOTE — Therapy (Signed)
[facility]. [place], [place], [address] Phone: [phone]   Fax:  [phone]  Physical Therapy Treatment  Patient Details  Name: Becky Hobbs MRN: [id number] Date of Birth: 1946-01-07 Referring Provider (PT): Lovorn   Encounter Date: 12/08/2021   PT End of Session - 12/08/21 1511     Visit Number 8    Date for PT Re-Evaluation 12/24/21    PT Start Time 1430    PT Stop Time 1515    PT Time Calculation (min) 45 min    Activity Tolerance Patient tolerated treatment well    Behavior During Therapy [facility] for tasks assessed/performed             Past Medical History:  Diagnosis Date   Anemia    Anxiety    panic attacks   Arthritis    Asthma    Blood transfusion    1973--with twins birth   Bronchitis    Chronic kidney disease    Diabetes mellitus    borderline   GERD (gastroesophageal reflux disease)    H/O hiatal hernia    surgery fixed   Headache    migraines   Heart murmur    MVP   Hepatitis    Hep C   Hypertension    Hypothyroidism    Pneumonia     Past Surgical History:  Procedure Laterality Date   ABDOMINAL HYSTERECTOMY     APPENDECTOMY     APPLICATION OF INTRAOPERATIVE CT SCAN N/A 06/05/2021   Procedure: APPLICATION OF INTRAOPERATIVE CT SCAN;  Surgeon: Karsten Ro, DO;  Location: [facility];  Service: Neurosurgery;  Laterality: N/A;   BACK SURGERY     bladder tack     BREAST CYST EXCISION     BREAST CYST INCISION AND DRAINAGE     BREAST SURGERY     BUNIONECTOMY     CHOLECYSTECTOMY     DILATION AND CURETTAGE OF UTERUS     ESOPHAGOGASTRODUODENOSCOPY (EGD) WITH PROPOFOL N/A 01/03/2019   Procedure: ESOPHAGOGASTRODUODENOSCOPY (EGD) WITH PROPOFOL;  Surgeon: Otis Brace, MD;  Location: [facility];  Service: Gastroenterology;  Laterality: N/A;   FOREIGN BODY REMOVAL  01/03/2019   Procedure: FOREIGN BODY REMOVAL;  Surgeon: Otis Brace, MD;  Location: [facility];  Service:  Gastroenterology;;   FRACTURE SURGERY     left wrist   HERNIA REPAIR     hiatal hernia   LUMBAR FUSION  07/26/2017   LUMBAR LAMINECTOMY  03/04/2012   Procedure: MICRODISCECTOMY LUMBAR LAMINECTOMY;  Surgeon: Jessy Oto, MD;  Location: [facility];  Service: Orthopedics;  Laterality: N/A;  L3-4 central laminectomy   NASAL SEPTUM SURGERY     nissan fundoplication     OVARIAN CYST SURGERY     POSTERIOR CERVICAL FUSION/FORAMINOTOMY N/A 06/05/2021   Procedure: OCCIPITAL- CERVICAL TWO INSTRUMENTATION AND FUSION; EXTENSION TO CERVICAL FIVE;  Surgeon: Karsten Ro, DO;  Location: [facility];  Service: Neurosurgery;  Laterality: N/A;   THORACIC FUSION  07/26/2017   TONSILLECTOMY      There were no vitals filed for this visit.   Subjective Assessment - 12/08/21 1435     Subjective Pt reports having some back spasms. "I go to the back doctor tomorrow"    Currently in Pain? Yes    Pain Score 5     Pain Location Back    Pain Descriptors / Indicators Spasm  [facility] Adult PT Treatment/Exercise - 12/08/21 0001       High Level Balance   High Level Balance Activities Side stepping      Neck Exercises: Machines for Strengthening   UBE (Upper Arm Bike) --      Neck Exercises: Standing   Other Standing Exercises Rows & Ext  green 2x10      Lumbar Exercises: Aerobic   Nustep L4 x 6 minutes      Lumbar Exercises: Machines for Strengthening   Cybex Knee Extension 5lb 2x10    Cybex Knee Flexion 20lb 2x10      Lumbar Exercises: Seated   Sit to Stand 5 reps   x2 OHP yellow ball                      PT Short Term Goals - 11/06/21 1654       PT SHORT TERM GOAL #1   Title I with initial HEP    Status Achieved               PT Long Term Goals - 12/04/21 1350       PT LONG TERM GOAL #1   Title independent with HEP and how to progress herself    Time 6    Period Weeks    Status On-going    Target Date 12/24/21      PT  LONG TERM GOAL #4   Title Patient will be able to get through her day with pain </+3/10    Baseline 3/10 max    Status Achieved                   Plan - 12/08/21 1512     Clinical Impression Statement Pt did well overall today. She reports improvement in her spasms after standing rows and extensions. Cue for full ROM needed with seated leg curls and extensions. Pt did struggle some with sit to stands OHP. Some instability with side steps.    Personal Factors and Comorbidities Past/Current Experience;Comorbidity 1    Comorbidities multiple back surgeries.    Examination-Activity Limitations Locomotion Level;Reach Overhead;Bend;Sit;Sleep;Carry;Squat;Stairs;Lift    Examination-Participation Restrictions Cleaning;Laundry    Stability/Clinical Decision Making Stable/Uncomplicated    Rehab Potential Good    PT Frequency 2x / week    PT Duration 6 weeks    PT Treatment/Interventions ADLs/Self Care Home Management;DME Instruction;Neuromuscular re-education;Manual techniques;Balance training;Therapeutic exercise;Therapeutic activities;Functional mobility training;Stair training;Gait training;Dry needling;Passive range of motion;Moist Heat;Iontophoresis 4mg /ml Dexamethasone    PT Next Visit Plan Patient would like to try the elliptical as she has one at her home.             Patient will benefit from skilled therapeutic intervention in order to improve the following deficits and impairments:  Abnormal gait, Decreased range of motion, Difficulty walking, Pain, Decreased mobility, Decreased strength, Postural dysfunction, Improper body mechanics, Impaired flexibility, Increased muscle spasms  Visit Diagnosis: Radiculopathy, cervical region  Unsteadiness on feet  Difficulty in walking, not elsewhere classified  Muscle weakness (generalized)  Other lack of coordination     Problem List Patient Active Problem List   Diagnosis Date Noted   Hepatic cirrhosis ([place]) 06/26/2021    Fracture of C1 vertebra, closed ([place]) 06/09/2021   Closed C1 fracture ([place]) 06/05/2021   Plantar fasciitis of left foot 08/21/2018   Acute renal failure (ARF) ([place]) 02/09/2018   Chronic migraine without aura without status migrainosus, not intractable 06/16/2017   History of lumbar fusion 08/03/2016  Class: Chronic   Migraine 04/09/2016   Acute-on-chronic kidney injury ([place]) 09/07/2015   Hypertension 09/07/2015   Hypothyroidism 09/07/2015   Gout 09/07/2015   Low back pain 09/07/2015   Obesity (BMI 30-39.9) 07/06/2013   Headache 04/12/2013   Concussion 04/12/2013   Lumbar spinal stenosis 03/04/2012    Class: Diagnosis of    Scot Jun, PTA 12/08/2021, 3:14 PM  [facility]. [place], [place], [address] Phone: [phone]   Fax:  [phone]  Name: Becky Hobbs MRN: [id number] Date of Birth: [birth year]

## 2021-12-08 NOTE — Patient Instructions (Signed)
Soak Instructions      Place 1/4 cup of epsom salts in a quart of warm tap water.  Submerge your foot for about 20  minutes -  remove your feet and dry well.  Apply a lotion to your feet to help keep the skin moist after soaks.

## 2021-12-11 ENCOUNTER — Other Ambulatory Visit: Payer: Self-pay

## 2021-12-11 ENCOUNTER — Ambulatory Visit: Payer: Medicare Other | Attending: Physical Medicine and Rehabilitation | Admitting: Physical Therapy

## 2021-12-11 ENCOUNTER — Encounter: Payer: Self-pay | Admitting: Physical Therapy

## 2021-12-11 DIAGNOSIS — R2681 Unsteadiness on feet: Secondary | ICD-10-CM | POA: Insufficient documentation

## 2021-12-11 DIAGNOSIS — M5412 Radiculopathy, cervical region: Secondary | ICD-10-CM | POA: Insufficient documentation

## 2021-12-11 DIAGNOSIS — M6281 Muscle weakness (generalized): Secondary | ICD-10-CM | POA: Insufficient documentation

## 2021-12-11 DIAGNOSIS — R262 Difficulty in walking, not elsewhere classified: Secondary | ICD-10-CM | POA: Insufficient documentation

## 2021-12-11 DIAGNOSIS — R278 Other lack of coordination: Secondary | ICD-10-CM | POA: Diagnosis present

## 2021-12-11 NOTE — Therapy (Signed)
[facility] ?[address] ?[address] ?[place], [place], [address] ?Phone: [phone]   Fax:  [phone] ? ?Physical Therapy Treatment ? ?Patient Details  ?Name: Becky Hobbs ?MRN: [id number] ?Date of Birth: 01/05/1946 ?Referring Provider (PT): Lovorn ? ? ?Encounter Date: 12/11/2021 ? ? PT End of Session - 12/11/21 1415   ? ? Visit Number 9   ? Date for PT Re-Evaluation 12/24/21   ? PT Start Time 1402   ? PT Stop Time 1440   ? PT Time Calculation (min) 38 min   ? Activity Tolerance Patient tolerated treatment well   ? Behavior During Therapy [facility] for tasks assessed/performed   ? ?  ?  ? ?  ? ? ?Past Medical History:  ?Diagnosis Date  ? Anemia   ? Anxiety   ? panic attacks  ? Arthritis   ? Asthma   ? Blood transfusion   ? 1973--with twins birth  ? Bronchitis   ? Chronic kidney disease   ? Diabetes mellitus   ? borderline  ? GERD (gastroesophageal reflux disease)   ? H/O hiatal hernia   ? surgery fixed  ? Headache   ? migraines  ? Heart murmur   ? MVP  ? Hepatitis   ? Hep C  ? Hypertension   ? Hypothyroidism   ? Pneumonia   ? ? ?Past Surgical History:  ?Procedure Laterality Date  ? ABDOMINAL HYSTERECTOMY    ? APPENDECTOMY    ? APPLICATION OF INTRAOPERATIVE CT SCAN N/A 06/05/2021  ? Procedure: APPLICATION OF INTRAOPERATIVE CT SCAN;  Surgeon: Dawley, Theodoro Doing, DO;  Location: [facility];  Service: Neurosurgery;  Laterality: N/A;  ? BACK SURGERY    ? bladder tack    ? BREAST CYST EXCISION    ? BREAST CYST INCISION AND DRAINAGE    ? BREAST SURGERY    ? BUNIONECTOMY    ? CHOLECYSTECTOMY    ? DILATION AND CURETTAGE OF UTERUS    ? ESOPHAGOGASTRODUODENOSCOPY (EGD) WITH PROPOFOL N/A 01/03/2019  ? Procedure: ESOPHAGOGASTRODUODENOSCOPY (EGD) WITH PROPOFOL;  Surgeon: Otis Brace, MD;  Location: [facility];  Service: Gastroenterology;  Laterality: N/A;  ? FOREIGN BODY REMOVAL  01/03/2019  ? Procedure: FOREIGN BODY REMOVAL;  Surgeon: Otis Brace, MD;  Location: [facility];  Service:  Gastroenterology;;  ? FRACTURE SURGERY    ? left wrist  ? HERNIA REPAIR    ? hiatal hernia  ? LUMBAR FUSION  07/26/2017  ? LUMBAR LAMINECTOMY  03/04/2012  ? Procedure: MICRODISCECTOMY LUMBAR LAMINECTOMY;  Surgeon: Jessy Oto, MD;  Location: [facility];  Service: Orthopedics;  Laterality: N/A;  L3-4 central laminectomy  ? NASAL SEPTUM SURGERY    ? nissan fundoplication    ? OVARIAN CYST SURGERY    ? POSTERIOR CERVICAL FUSION/FORAMINOTOMY N/A 06/05/2021  ? Procedure: OCCIPITAL- CERVICAL TWO INSTRUMENTATION AND FUSION; EXTENSION TO CERVICAL FIVE;  Surgeon: Dawley, Theodoro Doing, DO;  Location: [facility];  Service: Neurosurgery;  Laterality: N/A;  ? THORACIC FUSION  07/26/2017  ? TONSILLECTOMY    ? ? ?There were no vitals filed for this visit. ? ? Subjective Assessment - 12/11/21 1405   ? ? Subjective Patient reports increased pain after trying to paint her ceiling. She had diffiuclty sleeping, but after performing some stretching she feels better.   ? Pertinent History Multiple back surgeries, recent neck fusion, 8/22, with therapy afterwards.   ? Limitations Sitting;Lifting;Standing;Walking;House hold activities   ? Currently in Pain? No/denies   ? ?  ?  ? ?  ? ? ? ? ? ? ? ? ? ? ? ? ? ? ? ? ? ? ? ?  [facility] Adult PT Treatment/Exercise - 12/11/21 0001   ? ?  ? Lumbar Exercises: Aerobic  ? Stationary Bike L3 x 5 minutes.   ? Nustep L5- 2 x 30 seconds each of pushing and then pulling with UEs.   ?  ? Lumbar Exercises: Standing  ? Other Standing Lumbar Exercises Paloff press against 5# resistance, 2 x 10 reps each side.   ? Other Standing Lumbar Exercises Alternating step ups on 4" step. Able to complete 7 reps with each leg before fatiguing.   ?  ? Lumbar Exercises: Seated  ? Other Seated Lumbar Exercises Seated trunk extension against black theraband. 1 x 10 reps.   ? ?  ?  ? ?  ? ? ? ? ? ? ? ? ? ? ? ? PT Short Term Goals - 11/06/21 1654   ? ?  ? PT SHORT TERM GOAL #1  ? Title I with initial HEP   ? Status Achieved   ? ?  ?  ? ?  ? ? ? ?  PT Long Term Goals - 12/11/21 1440   ? ?  ? PT LONG TERM GOAL #1  ? Title independent with HEP and how to progress herself   ? Time 4   ? Period Weeks   ? Status On-going   ? Target Date 12/24/21   ?  ? PT LONG TERM GOAL #4  ? Title Patient will be able to get through her day with pain </+3/10   ? Baseline 3/10 max   ? Status Achieved   ? ?  ?  ? ?  ? ? ? ? ? ? ? ? Plan - 12/11/21 1416   ? ? Clinical Impression Statement Patient reports no changes. She feels she needs to emphasize upper trunk, so treatment focused on new activities for scapular and trunk stabilization. She tolerated well. Needs to be reminded to not overdue activities.   ? Personal Factors and Comorbidities Past/Current Experience;Comorbidity 1   ? Comorbidities multiple back surgeries.   ? Examination-Activity Limitations Locomotion Level;Reach Overhead;Bend;Sit;Sleep;Carry;Squat;Stairs;Lift   ? Examination-Participation Restrictions Cleaning;Laundry   ? Stability/Clinical Decision Making Stable/Uncomplicated   ? Clinical Decision Making Low   ? Rehab Potential Good   ? PT Frequency 2x / week   ? PT Duration 6 weeks   ? PT Treatment/Interventions ADLs/Self Care Home Management;DME Instruction;Neuromuscular re-education;Manual techniques;Balance training;Therapeutic exercise;Therapeutic activities;Functional mobility training;Stair training;Gait training;Dry needling;Passive range of motion;Moist Heat;Iontophoresis 4mg /ml Dexamethasone   ? PT Next Visit Plan Re -asses LTG   ? Consulted and Agree with Plan of Care Patient   ? ?  ?  ? ?  ? ? ?Patient will benefit from skilled therapeutic intervention in order to improve the following deficits and impairments:  Abnormal gait, Decreased range of motion, Difficulty walking, Pain, Decreased mobility, Decreased strength, Postural dysfunction, Improper body mechanics, Impaired flexibility, Increased muscle spasms ? ?Visit Diagnosis: ?Radiculopathy, cervical region ? ?Unsteadiness on feet ? ?Difficulty in  walking, not elsewhere classified ? ?Muscle weakness (generalized) ? ?Other lack of coordination ? ? ? ? ?Problem List ?Patient Active Problem List  ? Diagnosis Date Noted  ? Hepatic cirrhosis ([place]) 06/26/2021  ? Fracture of C1 vertebra, closed ([place]) 06/09/2021  ? Closed C1 fracture ([place]) 06/05/2021  ? Plantar fasciitis of left foot 08/21/2018  ? Acute renal failure (ARF) ([place]) 02/09/2018  ? Chronic migraine without aura without status migrainosus, not intractable 06/16/2017  ? History of lumbar fusion 08/03/2016  ?  Class: Chronic  ?  Migraine 04/09/2016  ? Acute-on-chronic kidney injury ([place]) 09/07/2015  ? Hypertension 09/07/2015  ? Hypothyroidism 09/07/2015  ? Gout 09/07/2015  ? Low back pain 09/07/2015  ? Obesity (BMI 30-39.9) 07/06/2013  ? Headache 04/12/2013  ? Concussion 04/12/2013  ? Lumbar spinal stenosis 03/04/2012  ?  Class: Diagnosis of  ? ? ?Marcelina Morel, DPT ?12/11/2021, 2:42 PM ? ?[facility] ?[address] ?[address] ?[place], [place], [address] ?Phone: [phone]   Fax:  [phone] ? ?Name: Becky Hobbs ?MRN: [id number] ?Date of Birth: 1946-02-12 ? ? ? ?

## 2021-12-15 ENCOUNTER — Ambulatory Visit: Payer: Medicare Other | Admitting: Physical Therapy

## 2021-12-17 ENCOUNTER — Encounter: Payer: Self-pay | Admitting: Physical Medicine and Rehabilitation

## 2021-12-17 ENCOUNTER — Ambulatory Visit: Payer: Medicare Other | Admitting: Physical Therapy

## 2021-12-17 ENCOUNTER — Telehealth: Payer: Self-pay | Admitting: Podiatrist

## 2021-12-17 ENCOUNTER — Encounter
Payer: Medicare Other | Attending: Physical Medicine and Rehabilitation | Admitting: Physical Medicine and Rehabilitation

## 2021-12-17 ENCOUNTER — Other Ambulatory Visit: Payer: Self-pay

## 2021-12-17 VITALS — BP 153/78 | HR 73 | Ht 64.0 in | Wt 211.0 lb

## 2021-12-17 DIAGNOSIS — M7918 Myalgia, other site: Secondary | ICD-10-CM | POA: Insufficient documentation

## 2021-12-17 DIAGNOSIS — S12000S Unspecified displaced fracture of first cervical vertebra, sequela: Secondary | ICD-10-CM | POA: Insufficient documentation

## 2021-12-17 DIAGNOSIS — S12001G Unspecified nondisplaced fracture of first cervical vertebra, subsequent encounter for fracture with delayed healing: Secondary | ICD-10-CM | POA: Insufficient documentation

## 2021-12-17 MED ORDER — BACLOFEN 5 MG PO TABS: 5.0000 mg | ORAL_TABLET | Freq: Two times a day (BID) | ORAL | 5 refills | Status: AC | PRN

## 2021-12-17 NOTE — Progress Notes (Signed)
? ?Subjective:  ? ? Patient ID: Becky Hobbs, female    DOB: 1946-01-07, 76 y.o.   MRN: [id number] ? 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  ? ?Getting spasms in back- upper back and neck  ? ?Got Prednisone for spasms- is tapering from 60 mg to 10 mg- over 6 days.  ? ?Didn't go to PT Monday ? ?Cramping/spasms/grabs her ?Has Zanaflex- 4 mg- takes at night-  ?Sleeps in recliner- because too hard to get in bathroom from bed.  ? ?Pain Inventory ?Average Pain 10 ?Pain Right Now 3 ?My pain is intermittent, sharp, and stabbing ? ?In the last 24 hours, has pain interfered with the following? ?General activity 5 ?Relation with others 0 ?Enjoyment of life 0 ?What TIME of day is your pain at its worst? morning , daytime, evening, and night ?Sleep (in general) Poor ? ?Pain is worse with: walking, bending, sitting, standing, and some activites ?Pain improves with: rest, heat/ice, and medication ?Relief from Meds: 3 ? ?Family History  ?Problem Relation Age of Onset  ? Kidney disease Mother   ? Vascular Disease Mother   ? ?Social History  ? ?Socioeconomic History  ? Marital status: Widowed  ?  Spouse name: Not on file  ? Number of children: 3  ? Years of education: college  ? Highest education level: Not on file  ?Occupational History  ? Occupation: retired  ?  Comment: Retired  ?Tobacco Use  ? Smoking status: Former  ?  Types: Cigarettes  ? Smokeless tobacco: Never  ?Vaping Use  ? Vaping Use: Never used  ?Substance and Sexual Activity  ? Alcohol use: No  ? Drug use: No  ? Sexual activity: Never  ?Other Topics Concern  ? Not on file  ?Social History Narrative  ? Patient has a high school education.   ? Patient is widow.  ? Patient is retired.  ? ?Social Determinants of Health  ? ?Financial Resource Strain: Not on file   ?Food Insecurity: Not on file  ?Transportation Needs: Not on file  ?Physical Activity: Not on file  ?Stress: Not on file  ?Social Connections: Not on file  ? ?Past Surgical History:  ?Procedure Laterality Date  ? ABDOMINAL HYSTERECTOMY    ? APPENDECTOMY    ? APPLICATION OF INTRAOPERATIVE CT SCAN N/A 06/05/2021  ? Procedure: APPLICATION OF INTRAOPERATIVE CT SCAN;  Surgeon: Dawley, Theodoro Doing, DO;  Location: [place];  Service: Neurosurgery;  Laterality: N/A;  ? BACK SURGERY    ? bladder tack    ? BREAST CYST EXCISION    ? BREAST CYST INCISION AND DRAINAGE    ? BREAST SURGERY    ? BUNIONECTOMY    ? CHOLECYSTECTOMY    ? DILATION AND CURETTAGE OF UTERUS    ? ESOPHAGOGASTRODUODENOSCOPY (EGD) WITH PROPOFOL N/A 01/03/2019  ? Procedure: ESOPHAGOGASTRODUODENOSCOPY (EGD) WITH PROPOFOL;  Surgeon: Otis Brace, MD;  [facility];  Service: Gastroenterology;  Laterality: N/A;  ? FOREIGN BODY REMOVAL  01/03/2019  ? Procedure: FOREIGN BODY REMOVAL;  Surgeon: Otis Brace, MD;  [facility];  Service: Gastroenterology;;  ? FRACTURE SURGERY    ? left wrist  ? HERNIA REPAIR    ? hiatal hernia  ? LUMBAR FUSION  07/26/2017  ? LUMBAR LAMINECTOMY  03/04/2012  ? Procedure: MICRODISCECTOMY LUMBAR LAMINECTOMY;  Surgeon: Jessy Oto, MD;  Location: [place];  Service: Orthopedics;  Laterality: N/A;  L3-4 central laminectomy  ? NASAL SEPTUM SURGERY    ? nissan fundoplication    ? OVARIAN CYST SURGERY    ? POSTERIOR CERVICAL FUSION/FORAMINOTOMY N/A 06/05/2021  ? Procedure: OCCIPITAL- CERVICAL TWO INSTRUMENTATION AND FUSION; EXTENSION TO CERVICAL FIVE;  Surgeon: Dawley, Theodoro Doing, DO;  Location: [facility];  Service: Neurosurgery;  Laterality: N/A;  ? THORACIC FUSION  07/26/2017  ? TONSILLECTOMY    ? ?Past Surgical History:  ?Procedure Laterality Date  ? ABDOMINAL HYSTERECTOMY    ? APPENDECTOMY    ? APPLICATION OF INTRAOPERATIVE CT SCAN N/A 06/05/2021  ? Procedure: APPLICATION OF INTRAOPERATIVE CT SCAN;  Surgeon: Dawley, Theodoro Doing, DO;   Location: [place];  Service: Neurosurgery;  Laterality: N/A;  ? BACK SURGERY    ? bladder tack    ? BREAST CYST EXCISION    ? BREAST CYST INCISION AND DRAINAGE    ? BREAST SURGERY    ? BUNIONECTOMY    ? CHOLECYSTECTOMY    ? DILATION AND CURETTAGE OF UTERUS    ? ESOPHAGOGASTRODUODENOSCOPY (EGD) WITH PROPOFOL N/A 01/03/2019  ? Procedure: ESOPHAGOGASTRODUODENOSCOPY (EGD) WITH PROPOFOL;  Surgeon: Otis Brace, MD;  [facility];  Service: Gastroenterology;  Laterality: N/A;  ? FOREIGN BODY REMOVAL  01/03/2019  ? Procedure: FOREIGN BODY REMOVAL;  Surgeon: Otis Brace, MD;  [facility];  Service: Gastroenterology;;  ? FRACTURE SURGERY    ? left wrist  ? HERNIA REPAIR    ? hiatal hernia  ? LUMBAR FUSION  07/26/2017  ? LUMBAR LAMINECTOMY  03/04/2012  ? Procedure: MICRODISCECTOMY LUMBAR LAMINECTOMY;  Surgeon: Jessy Oto, MD;  Location: [facility];  Service: Orthopedics;  Laterality: N/A;  L3-4 central laminectomy  ? NASAL SEPTUM SURGERY    ? nissan fundoplication    ? OVARIAN CYST SURGERY    ? POSTERIOR CERVICAL FUSION/FORAMINOTOMY N/A 06/05/2021  ? Procedure: OCCIPITAL- CERVICAL TWO INSTRUMENTATION AND FUSION; EXTENSION TO CERVICAL FIVE;  Surgeon: Dawley, Theodoro Doing, DO;  Location: [place];  Service: Neurosurgery;  Laterality: N/A;  ? THORACIC FUSION  07/26/2017  ? TONSILLECTOMY    ? ?Past Medical History:  ?Diagnosis Date  ? Anemia   ? Anxiety   ? panic attacks  ? Arthritis   ? Asthma   ? Blood transfusion   ? 1973--with twins birth  ? Bronchitis   ? Chronic kidney disease   ? Diabetes mellitus   ? borderline  ? GERD (gastroesophageal reflux disease)   ? H/O hiatal hernia   ? surgery fixed  ? Headache   ? migraines  ? Heart murmur   ? MVP  ? Hepatitis   ? Hep C  ? Hypertension   ? Hypothyroidism   ? Pneumonia   ? ?Ht '5\' 4"'$  (1.626 m)   Wt 211 lb (95.7 kg)   BMI 36.22 kg/m?  ? ?Opioid Risk Score:   ?Fall Risk Score:  `1 ? ?Depression screen PHQ 2/9 ? ?Depression screen [facility] 2/9 08/01/2021  ?Decreased  Interest 0  ?Down, Depressed, Hopeless 0  ?PHQ - 2 Score 0  ?Altered sleeping 3  ?Tired, decreased energy 3  ?Change in appetite 1  ?Feeling bad or failure about yourself  0  ?Trouble concentrating 0  ?Moving slowly or fidgety/restless 0  ?Suicidal thoughts 0  ?PHQ-9 Score 7  ?  ?Review of Systems  ?Musculoskeletal:  Positive for back  pain and gait problem.  ?All other systems reviewed and are negative. ? ?   ?Objective:  ? Physical Exam ? ?Awake, alert, appropriate, walking with SPC; NAD ?Trigger points in upper traps, levators and cervical paraspinals B/L  ?No hoffman's in Ue's B/L  ?No increased tone in Ue's or LE's B/L  ?   ?Assessment & Plan:  ? 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 .  ? ?Cr usually ~ 2.6-2.9- but per pt, down to 2.1 ? ?Can take Tizanidine/Zanaflex 2 mg (so 1/2 tab)- but only has capsules- and has tried 2 mg in past, without improvement.  ? ?2. Although has CKD stage IV- can do tiny dose of Baclofen 5 mg 2x/day  as needed- CANNOT more/higher dose due to kidney disease.  ? ? ?3. STRONGLY SUGGEST dry needling by therapy- see if anyone can do this for myofascial pain. Ask them if they need another prescription- actually wrote a written Rx for it.  ? ?4. Can take Zanaflex/Tizanidine  and Baclofen if needed- I suggest taking Baclofen during day and Tizanidine at night.  ? ?5. F/U in 3 months.  ? ?6. Tennis ball- hold in place for at least 2 minutes-(hold 2-4 minutes)  can get at grocery store usually- holding pressure, not massaging.  ? ?I spent a total of 22   minutes on total care today- >50% coordination of care- due to  education on muscle spasms- and to try Baclofen. Also educated on muscle spasms.  ? ?

## 2021-12-17 NOTE — Telephone Encounter (Signed)
Pt called and stated she was given steriods by the back doctor for her spasms and her feet are better. She stated that she was not sure if she should keep her appt since her foot is feeling better. She has canceled the appt for Monday and she is to continue the antibiotics. ?

## 2021-12-17 NOTE — Patient Instructions (Addendum)
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 .  ? ?Cr usually ~ 2.6-2.9- but per pt, down to 2.1 ? ?Can take Tizanidine/Zanaflex 2 mg (so 1/2 tab)- but only has capsules- and has tried 2 mg in past, without improvement.  ? ?2. Although has CKD stage IV- can do tiny dose of Baclofen 5 mg 2x/day  as needed- CANNOT more/higher dose due to kidney disease.  ? ? ?3. STRONGLY SUGGEST dry needling by therapy- see if anyone can do this for myofascial pain. Ask them if they need another prescription- actually wrote a written Rx for it.  ? ?4. Can take Zanaflex/Tizanidine  and Baclofen if needed- I suggest taking Baclofen during day and Tizanidine at night.  ? ?5. F/U in 3 months.  ? ?6. Tennis ball- hold in place for at least 2 minutes-(hold 2-4 minutes)  can get at grocery store usually- holding pressure, not massaging.  ?

## 2021-12-22 ENCOUNTER — Ambulatory Visit: Payer: Medicare Other | Admitting: Podiatrist

## 2021-12-22 ENCOUNTER — Telehealth: Payer: Self-pay

## 2021-12-22 ENCOUNTER — Ambulatory Visit: Payer: Medicare Other | Admitting: Physical Therapy

## 2021-12-22 NOTE — Telephone Encounter (Signed)
Patient called stating that she is not going to rehab today and probably not Wednesday either. Her upper back, neck hurts. She felt bad all weekend. She has been doing the exercises with the tennis ball.  ?

## 2021-12-22 NOTE — Telephone Encounter (Signed)
That's up to her, but I wouldn't miss more than 3 days, they might d/c her from therapy- ML

## 2021-12-24 ENCOUNTER — Ambulatory Visit: Payer: Medicare Other | Admitting: Physical Therapy

## 2021-12-24 NOTE — Telephone Encounter (Signed)
Patient called back. She is aware of Dr. Florentina Jenny  reply. ?

## 2021-12-24 NOTE — Telephone Encounter (Signed)
Called and left patient a message to call back.

## 2022-01-02 ENCOUNTER — Telehealth: Payer: Self-pay | Admitting: Sports Medicine

## 2022-01-02 NOTE — Telephone Encounter (Signed)
Patient called answering service and states that she has a pain in her foot that is underneath her big toe joint radiating into the arch states that she noticed the pain this morning and it is difficult to walk or step states that she has been calling the office but could not get anyone to answer.  I apologized to the patient and inform her that our office closes early on Fridays.  Advised patient to rest ice elevate use over-the-counter Ace wrap and may take Tylenol or Aleve if this pain does not improve encouraged her to go to urgent care over the weekend.  Patient reports that she thinks she will be okay for the weekend and if pain is no better on Monday she will call office for a follow-up appointment.  Advised patient that our office opens from 8-5 on Monday and she can call if she feels like she still needs an appointment to be seen. ?

## 2022-01-09 ENCOUNTER — Ambulatory Visit: Payer: Medicare Other | Admitting: Podiatry

## 2022-01-09 ENCOUNTER — Encounter: Payer: Self-pay | Admitting: Podiatry

## 2022-01-09 DIAGNOSIS — M7752 Other enthesopathy of left foot: Secondary | ICD-10-CM | POA: Diagnosis not present

## 2022-01-09 MED ORDER — TRIAMCINOLONE ACETONIDE 10 MG/ML IJ SUSP
10.0000 mg | Freq: Once | INTRAMUSCULAR | Status: AC
  Administered 2022-01-09: 10 mg

## 2022-01-09 NOTE — Progress Notes (Signed)
Subjective:  ? ?Patient ID: Becky Hobbs, female   DOB: 76 y.o.   MRN: [id number]  ? ?HPI ?Patient states that she finished her antibiotic but she has developed inflammation in her forefoot with no drainage no pathology and has had history of gout.  States its been very sore around the big toe joint ? ? ?ROS ? ? ?   ?Objective:  ?Physical Exam  ?Neurovascular status intact no signs of soft tissue breakdown of tissue or other infective pathology appears to be inflammatory around the first MPJ ? ?   ?Assessment:  ?Probability for inflammatory condition first MPJ left foot ? ?   ?Plan:  ?Sterile prep and went ahead today and injected the first MPJ 3 mg dexamethasone Kenalog 5 mg Xylocaine advised on compression therapy and if any changes were to occur to let us know immediately and if any systemic signs of infection or other pathology to go straight to the emergency room.  She will be seen back as needed ?   ? ? ?

## 2022-01-13 ENCOUNTER — Encounter: Payer: Self-pay | Admitting: Physical Therapy

## 2022-01-13 ENCOUNTER — Ambulatory Visit: Payer: Medicare Other | Attending: Physical Medicine and Rehabilitation | Admitting: Physical Therapy

## 2022-01-13 DIAGNOSIS — M5412 Radiculopathy, cervical region: Secondary | ICD-10-CM | POA: Insufficient documentation

## 2022-01-13 DIAGNOSIS — R2681 Unsteadiness on feet: Secondary | ICD-10-CM | POA: Insufficient documentation

## 2022-01-13 DIAGNOSIS — M6281 Muscle weakness (generalized): Secondary | ICD-10-CM | POA: Diagnosis present

## 2022-01-13 DIAGNOSIS — R278 Other lack of coordination: Secondary | ICD-10-CM | POA: Diagnosis present

## 2022-01-13 DIAGNOSIS — R262 Difficulty in walking, not elsewhere classified: Secondary | ICD-10-CM | POA: Diagnosis present

## 2022-01-13 NOTE — Therapy (Addendum)
[facility] [address] [place], [place], [address] Phone: [phone]   Fax:  [phone]  Physical Therapy Treatment Progress Note Reporting Period 10/29/21 to 01/13/22  See note below for Objective Data and Assessment of Progress/Goals.     Patient Details  Name: Becky Hobbs MRN: [id number] Date of Birth: [birth year] Referring Provider (PT): Lov[place]   Encounter Date: 01/13/2022   PT End of Session - 01/13/22 1058     Visit Number 10    PT Start Time 1016    PT Stop Time 1058    PT Time Calculation (min) 42 min    Activity Tolerance Patient tolerated treatment well    Behavior During Therapy WFL for tasks assessed/performed             Past Medical History:  Diagnosis Date   Anemia    Anxiety    panic attacks   Arthritis    Asthma    Blood transfusion    1973--with twins birth   Bronchitis    Chronic kidney disease    Diabetes mellitus    borderline   GERD (gastroesophageal reflux disease)    H/O hiatal hernia    surgery fixed   Headache    migraines   Heart murmur    MVP   Hepatitis    Hep C   Hypertension    Hypothyroidism    Pneumonia     Past Surgical History:  Procedure Laterality Date   ABDOMINAL HYSTERECTOMY     APPENDECTOMY     APPLICATION OF INTRAOPERATIVE CT SCAN N/A 06/05/2021   Procedure: APPLICATION OF INTRAOPERATIVE CT SCAN;  Surgeon: Bethann Goo, DO;  [facility];  Service: Neurosurgery;  Laterality: N/A;   BACK SURGERY     bladder tack     BREAST CYST EXCISION     BREAST CYST INCISION AND DRAINAGE     BREAST SURGERY     BUNIONECTOMY     CHOLECYSTECTOMY     DILATION AND CURETTAGE OF UTERUS     ESOPHAGOGASTRODUODENOSCOPY (EGD) WITH PROPOFOL N/A 01/03/2019   Procedure: ESOPHAGOGASTRODUODENOSCOPY (EGD) WITH PROPOFOL;  Surgeon: Kathi Der, MD;  [facility];  Service: Gastroenterology;  Laterality: N/A;   FOREIGN BODY REMOVAL  01/03/2019   Procedure:  FOREIGN BODY REMOVAL;  Surgeon: Kathi Der, MD;  [facility];  Service: Gastroenterology;;   FRACTURE SURGERY     left wrist   HERNIA REPAIR     hiatal hernia   LUMBAR FUSION  07/26/2017   LUMBAR LAMINECTOMY  03/04/2012   Procedure: MICRODISCECTOMY LUMBAR LAMINECTOMY;  Surgeon: Kerrin Champagne, MD;  [facility];  Service: Orthopedics;  Laterality: N/A;  L3-4 central laminectomy   NASAL SEPTUM SURGERY     nissan fundoplication     OVARIAN CYST SURGERY     POSTERIOR CERVICAL FUSION/FORAMINOTOMY N/A 06/05/2021   Procedure: OCCIPITAL- CERVICAL TWO INSTRUMENTATION AND FUSION; EXTENSION TO CERVICAL FIVE;  Surgeon: Bethann Goo, DO;  [facility];  Service: Neurosurgery;  Laterality: N/A;   THORACIC FUSION  07/26/2017   TONSILLECTOMY      There were no vitals filed for this visit.   Subjective Assessment - 01/13/22 1021     Subjective Patient reports that she received an order for DN and also received education to use a tennis ball for deep pressure. She also recently had her L toe drained and injected with cortizone for inflammation. She feels unsteady and has been losing her  balance at home lately.    Pertinent History Multiple back surgeries, recent neck fusion, 8/22, with therapy afterwards.    Limitations Sitting;Lifting;Standing;Walking;House hold activities    Patient Stated Goals Strengthen neck and LE, improve balance.    Currently in Pain? No/denies                [facility] PT Assessment - 01/13/22 0001       Timed Up and Go Test   Normal TUG (seconds) 9.41                           [facility] Adult PT Treatment/Exercise - 01/13/22 0001       Neck Exercises: Theraband   Other Theraband Exercises Side taps on 4" step, 10 reps each.      Neck Exercises: Standing   Other Standing Exercises B side sto side stepping over a line on the floor, emphasizing speed and control. 60 seconds, CGA with 1 LOB posteriorly.    Other Standing Exercises  Paloff press with 10# resistance, 1 x 10 reps each side.      Lumbar Exercises: Standing   Other Standing Lumbar Exercises B side stepping against red Tband resistance with B handhold A, 3 x 5 in each direction.    Other Standing Lumbar Exercises monster walking forward and back with Red Tband around ankles, CGA, 1 episode of balance loss.   Then side step up onto Airex pad and off the other side, 8 times each direction. CGA with 2 episodes of posterior balance loss.      Manual Therapy   Manual Therapy Soft tissue mobilization    Soft tissue mobilization left upper trap              Trigger Point Dry Needling - 01/13/22 0001     Consent Given? Yes    Education Handout Provided Yes    Muscles Treated Head and Neck Upper trapezius    Upper Trapezius Response Twitch reponse elicited;Palpable increased muscle length                     PT Short Term Goals - 11/06/21 1654       PT SHORT TERM GOAL #1   Title I with initial HEP    Status Achieved               PT Long Term Goals - 01/13/22 1047       PT LONG TERM GOAL #1   Status On-going      PT LONG TERM GOAL #2   Title Perform TUG in <10 sec to demosntrate imporved balance and confidence.    Baseline 9.41    Status Achieved      PT LONG TERM GOAL #3   Status On-going      PT LONG TERM GOAL #4   Title Patient will be able to get through her day with pain </+3/10                   Plan - 01/13/22 1025     Clinical Impression Statement Patient reports that she has an order for DN for her neck. She feels that she still needs to work on her balance as she feels unsteady when walking, tends to lose her balance to her R.Treatemnt focused on BLE strength, espacially lateral hips, and balance, as well as DN for neck spasms. She continues to progress with her balance and strength,  met LTG for TUG.    Personal Factors and Comorbidities Past/Current Experience;Comorbidity 1    Examination-Activity  Limitations Locomotion Level;Reach Overhead;Bend;Sit;Sleep;Carry;Squat;Stairs;Lift    Examination-Participation Restrictions Cleaning;Laundry    Stability/Clinical Decision Making Stable/Uncomplicated    Clinical Decision Making Low    Rehab Potential Good    PT Frequency 2x / week    PT Treatment/Interventions ADLs/Self Care Home Management;DME Instruction;Neuromuscular re-education;Manual techniques;Balance training;Therapeutic exercise;Therapeutic activities;Functional mobility training;Stair training;Gait training;Dry needling;Passive range of motion;Moist Heat;Iontophoresis 4mg /ml Dexamethasone    PT Home Exercise Plan Access Code: [email or web address]    Consulted and Agree with Plan of Care Patient             Patient will benefit from skilled therapeutic intervention in order to improve the following deficits and impairments:  Abnormal gait, Decreased range of motion, Difficulty walking, Pain, Decreased mobility, Decreased strength, Postural dysfunction, Improper body mechanics, Impaired flexibility, Increased muscle spasms  Visit Diagnosis: Radiculopathy, cervical region  Unsteadiness on feet  Difficulty in walking, not elsewhere classified  Muscle weakness (generalized)  Other lack of coordination     Problem List Patient Active Problem List   Diagnosis Date Noted   Myofascial pain 12/17/2021   Hepatic cirrhosis (HCC) 06/26/2021   Fracture of C1 vertebra, closed (HCC) 06/09/2021   Closed C1 fracture (HCC) 06/05/2021   Plantar fasciitis of left foot 08/21/2018   Acute renal failure (ARF) (HCC) 02/09/2018   Chronic migraine without aura without status migrainosus, not intractable 06/16/2017   History of lumbar fusion 08/03/2016    Class: Chronic   Migraine 04/09/2016   Acute-on-chronic kidney injury (HCC) 09/07/2015   Hypertension 09/07/2015   Hypothyroidism 09/07/2015   Gout 09/07/2015   Low back pain 09/07/2015   Obesity (BMI 30-39.9) 07/06/2013   Headache 04/12/2013    Concussion 04/12/2013   Lumbar spinal stenosis 03/04/2012    Class: Diagnosis of    Iona Beard, DPT 01/13/2022, 11:56 AM  [facility] [address] [place], [place], [address] Phone: [phone]   Fax:  [phone]  Name: Becky Hobbs MRN: [id number] Date of Birth: September 27, 1946

## 2022-01-13 NOTE — Patient Instructions (Signed)

## 2022-01-15 ENCOUNTER — Ambulatory Visit: Payer: Medicare Other | Admitting: Physical Therapy

## 2022-01-27 ENCOUNTER — Encounter: Payer: Self-pay | Admitting: Physical Therapy

## 2022-01-27 ENCOUNTER — Ambulatory Visit: Payer: Medicare Other | Admitting: Physical Therapy

## 2022-01-27 DIAGNOSIS — M6281 Muscle weakness (generalized): Secondary | ICD-10-CM

## 2022-01-27 DIAGNOSIS — R262 Difficulty in walking, not elsewhere classified: Secondary | ICD-10-CM

## 2022-01-27 DIAGNOSIS — R2681 Unsteadiness on feet: Secondary | ICD-10-CM

## 2022-01-27 DIAGNOSIS — M5412 Radiculopathy, cervical region: Secondary | ICD-10-CM | POA: Diagnosis not present

## 2022-01-27 NOTE — Therapy (Signed)
[place] ?[address] ?[place], [place], [address] ?Phone: [phone]   Fax:  [phone] ? ?Physical Therapy Treatment ? ?Patient Details  ?Name: Becky Hobbs ?MRN: [id number] ?Date of Birth: 25-Jul-1946 ?Referring Provider (PT): Lovorn ? ? ?Encounter Date: 01/27/2022 ? ? PT End of Session - 01/27/22 1335   ? ? Visit Number 11   ? PT Start Time 1300   ? PT Stop Time 2694   ? PT Time Calculation (min) 45 min   ? Activity Tolerance Patient tolerated treatment well   ? Behavior During Therapy [facility] for tasks assessed/performed   ? ?  ?  ? ?  ? ? ?Past Medical History:  ?Diagnosis Date  ? Anemia   ? Anxiety   ? panic attacks  ? Arthritis   ? Asthma   ? Blood transfusion   ? 1973--with twins birth  ? Bronchitis   ? Chronic kidney disease   ? Diabetes mellitus   ? borderline  ? GERD (gastroesophageal reflux disease)   ? H/O hiatal hernia   ? surgery fixed  ? Headache   ? migraines  ? Heart murmur   ? MVP  ? Hepatitis   ? Hep C  ? Hypertension   ? Hypothyroidism   ? Pneumonia   ? ? ?Past Surgical History:  ?Procedure Laterality Date  ? ABDOMINAL HYSTERECTOMY    ? APPENDECTOMY    ? APPLICATION OF INTRAOPERATIVE CT SCAN N/A 06/05/2021  ? Procedure: APPLICATION OF INTRAOPERATIVE CT SCAN;  Surgeon: Dawley, Theodoro Doing, DO;  Location: [place];  Service: Neurosurgery;  Laterality: N/A;  ? BACK SURGERY    ? bladder tack    ? BREAST CYST EXCISION    ? BREAST CYST INCISION AND DRAINAGE    ? BREAST SURGERY    ? BUNIONECTOMY    ? CHOLECYSTECTOMY    ? DILATION AND CURETTAGE OF UTERUS    ? ESOPHAGOGASTRODUODENOSCOPY (EGD) WITH PROPOFOL N/A 01/03/2019  ? Procedure: ESOPHAGOGASTRODUODENOSCOPY (EGD) WITH PROPOFOL;  Surgeon: Otis Brace, MD;  [facility];  Service: Gastroenterology;  Laterality: N/A;  ? FOREIGN BODY REMOVAL  01/03/2019  ? Procedure: FOREIGN BODY REMOVAL;  Surgeon: Otis Brace, MD;  [facility];  Service: Gastroenterology;;  ? FRACTURE SURGERY     ? left wrist  ? HERNIA REPAIR    ? hiatal hernia  ? LUMBAR FUSION  07/26/2017  ? LUMBAR LAMINECTOMY  03/04/2012  ? Procedure: MICRODISCECTOMY LUMBAR LAMINECTOMY;  Surgeon: Jessy Oto, MD;  Location: [place];  Service: Orthopedics;  Laterality: N/A;  L3-4 central laminectomy  ? NASAL SEPTUM SURGERY    ? nissan fundoplication    ? OVARIAN CYST SURGERY    ? POSTERIOR CERVICAL FUSION/FORAMINOTOMY N/A 06/05/2021  ? Procedure: OCCIPITAL- CERVICAL TWO INSTRUMENTATION AND FUSION; EXTENSION TO CERVICAL FIVE;  Surgeon: Dawley, Theodoro Doing, DO;  Location: [place];  Service: Neurosurgery;  Laterality: N/A;  ? THORACIC FUSION  07/26/2017  ? TONSILLECTOMY    ? ? ?There were no vitals filed for this visit. ? ? Subjective Assessment - 01/27/22 1301   ? ? Subjective Needling helped feeling good   ? Currently in Pain? No/denies   ? ?  ?  ? ?  ? ? ? ? ? ? ? ? ? ? ? ? ? ? ? ? ? ? ? ? [place] Adult PT Treatment/Exercise - 01/27/22 0001   ? ?  ? High Level Balance  ? High Level Balance Comments side  step on an off airex   ?  ? Lumbar Exercises: Aerobic  ? Nustep L4 x 6 minutes   ?  ? Lumbar Exercises: Standing  ? Row Strengthening;Both;20 reps;Theraband   ? Theraband Level (Row) Level 3 (Green)   ? Shoulder Extension Strengthening;Both;20 reps;Theraband   ? Theraband Level (Shoulder Extension) Level 3 (Green)   ? Other Standing Lumbar Exercises resisted gait 20lb forward backward x3 each, Alt box taps 6'' 2x10   ?  ? Manual Therapy  ? Manual Therapy Soft tissue mobilization   ? Soft tissue mobilization left upper trap   ? ?  ?  ? ?  ? ? ? Trigger Point Dry Needling - 01/27/22 0001   ? ? Consent Given? Yes   ? Upper Trapezius Response Twitch reponse elicited;Palpable increased muscle length   ? ?  ?  ? ?  ? ? ? ? ? ? ? ? ? ? PT Short Term Goals - 11/06/21 1654   ? ?  ? PT SHORT TERM GOAL #1  ? Title I with initial HEP   ? Status Achieved   ? ?  ?  ? ?  ? ? ? ? PT Long Term Goals - 01/27/22 1337   ? ?  ? PT LONG TERM GOAL #1  ? Title independent  with HEP and how to progress herself   ? Status Achieved   ?  ? PT LONG TERM GOAL #2  ? Title Perform TUG in <10 sec to demosntrate imporved balance and confidence.   ? Status Achieved   ?  ? PT LONG TERM GOAL #3  ? Title Paitent will score at least 19/24 on DGI.   ? Status On-going   ? ?  ?  ? ?  ? ? ? ? ? ? ? ? Plan - 01/27/22 1337   ? ? Clinical Impression Statement Pt enters clinic feeling well reporting that DN did help. Lead PT assisted in treatment session providing pt with DN. Continues with a progression of balance interventions. Pt has dome instability with resisted gait and side step son airex paid. No reports of increase pain. Constant cue for arm angle needed with rows.   ? Personal Factors and Comorbidities Past/Current Experience;Comorbidity 1   ? Comorbidities multiple back surgeries.   ? Examination-Activity Limitations Locomotion Level;Reach Overhead;Bend;Sit;Sleep;Carry;Squat;Stairs;Lift   ? PT Frequency 2x / week   ? PT Treatment/Interventions ADLs/Self Care Home Management;DME Instruction;Neuromuscular re-education;Manual techniques;Balance training;Therapeutic exercise;Therapeutic activities;Functional mobility training;Stair training;Gait training;Dry needling;Passive range of motion;Moist Heat;Iontophoresis '4mg'$ /ml Dexamethasone   ? PT Next Visit Plan Assess DN progress as tolerated   ? ?  ?  ? ?  ? ? ?Patient will benefit from skilled therapeutic intervention in order to improve the following deficits and impairments:  Abnormal gait, Decreased range of motion, Difficulty walking, Pain, Decreased mobility, Decreased strength, Postural dysfunction, Improper body mechanics, Impaired flexibility, Increased muscle spasms ? ?Visit Diagnosis: ?Muscle weakness (generalized) ? ?Difficulty in walking, not elsewhere classified ? ?Unsteadiness on feet ? ? ? ? ?Problem List ?Patient Active Problem List  ? Diagnosis Date Noted  ? Myofascial pain 12/17/2021  ? Hepatic cirrhosis ([place]) 06/26/2021  ? Fracture  of C1 vertebra, closed ([place]) 06/09/2021  ? Closed C1 fracture ([place]) 06/05/2021  ? Plantar fasciitis of left foot 08/21/2018  ? Acute renal failure (ARF) ([place]) 02/09/2018  ? Chronic migraine without aura without status migrainosus, not intractable 06/16/2017  ? History of lumbar fusion 08/03/2016  ?  Class: Chronic  ?  Migraine 04/09/2016  ? Acute-on-chronic kidney injury ([place]) 09/07/2015  ? Hypertension 09/07/2015  ? Hypothyroidism 09/07/2015  ? Gout 09/07/2015  ? Low back pain 09/07/2015  ? Obesity (BMI 30-39.9) 07/06/2013  ? Headache 04/12/2013  ? Concussion 04/12/2013  ? Lumbar spinal stenosis 03/04/2012  ?  Class: Diagnosis of  ? ? ?Scot Jun, PTA ?01/27/2022, 1:42 PM ? ?[place] ?[address] ?[address] ?[place], [place], [address] ?Phone: [phone]   Fax:  [phone] ? ?Name: Becky Hobbs ?MRN: [id number] ?Date of Birth: 29-Oct-1945 ? ? ? ?

## 2022-01-29 ENCOUNTER — Encounter: Payer: Self-pay | Admitting: Physical Therapy

## 2022-01-29 ENCOUNTER — Ambulatory Visit: Payer: Medicare Other | Admitting: Physical Therapy

## 2022-01-29 DIAGNOSIS — R2681 Unsteadiness on feet: Secondary | ICD-10-CM

## 2022-01-29 DIAGNOSIS — R262 Difficulty in walking, not elsewhere classified: Secondary | ICD-10-CM

## 2022-01-29 DIAGNOSIS — M5412 Radiculopathy, cervical region: Secondary | ICD-10-CM | POA: Diagnosis not present

## 2022-01-29 DIAGNOSIS — M6281 Muscle weakness (generalized): Secondary | ICD-10-CM

## 2022-01-29 NOTE — Therapy (Signed)
?[place] ?[address] ?[place], [place], [address] ?Phone: [phone]   Fax:  [phone] ? ?Physical Therapy Treatment ? ?Patient Details  ?Name: Becky Hobbs ?MRN: [id number] ?Date of Birth: 09-13-1946 ?Referring Provider (PT): Lovorn ? ? ?Encounter Date: 01/29/2022 ? ? PT End of Session - 01/29/22 1549   ? ? Visit Number 12   ? Date for PT Re-Evaluation 02/28/22   ? PT Start Time 1515   ? PT Stop Time 1600   ? PT Time Calculation (min) 45 min   ? Activity Tolerance Patient tolerated treatment well   ? Behavior During Therapy [facility] for tasks assessed/performed   ? ?  ?  ? ?  ? ? ?Past Medical History:  ?Diagnosis Date  ? Anemia   ? Anxiety   ? panic attacks  ? Arthritis   ? Asthma   ? Blood transfusion   ? 1973--with twins birth  ? Bronchitis   ? Chronic kidney disease   ? Diabetes mellitus   ? borderline  ? GERD (gastroesophageal reflux disease)   ? H/O hiatal hernia   ? surgery fixed  ? Headache   ? migraines  ? Heart murmur   ? MVP  ? Hepatitis   ? Hep C  ? Hypertension   ? Hypothyroidism   ? Pneumonia   ? ? ?Past Surgical History:  ?Procedure Laterality Date  ? ABDOMINAL HYSTERECTOMY    ? APPENDECTOMY    ? APPLICATION OF INTRAOPERATIVE CT SCAN N/A 06/05/2021  ? Procedure: APPLICATION OF INTRAOPERATIVE CT SCAN;  Surgeon: Dawley, Theodoro Doing, DO;  Location: [place];  Service: Neurosurgery;  Laterality: N/A;  ? BACK SURGERY    ? bladder tack    ? BREAST CYST EXCISION    ? BREAST CYST INCISION AND DRAINAGE    ? BREAST SURGERY    ? BUNIONECTOMY    ? CHOLECYSTECTOMY    ? DILATION AND CURETTAGE OF UTERUS    ? ESOPHAGOGASTRODUODENOSCOPY (EGD) WITH PROPOFOL N/A 01/03/2019  ? Procedure: ESOPHAGOGASTRODUODENOSCOPY (EGD) WITH PROPOFOL;  Surgeon: Otis Brace, MD;  [facility];  Service: Gastroenterology;  Laterality: N/A;  ? FOREIGN BODY REMOVAL  01/03/2019  ? Procedure: FOREIGN BODY REMOVAL;  Surgeon: Otis Brace, MD;  [facility];  Service:  Gastroenterology;;  ? FRACTURE SURGERY    ? left wrist  ? HERNIA REPAIR    ? hiatal hernia  ? LUMBAR FUSION  07/26/2017  ? LUMBAR LAMINECTOMY  03/04/2012  ? Procedure: MICRODISCECTOMY LUMBAR LAMINECTOMY;  Surgeon: Jessy Oto, MD;  Location: [place];  Service: Orthopedics;  Laterality: N/A;  L3-4 central laminectomy  ? NASAL SEPTUM SURGERY    ? nissan fundoplication    ? OVARIAN CYST SURGERY    ? POSTERIOR CERVICAL FUSION/FORAMINOTOMY N/A 06/05/2021  ? Procedure: OCCIPITAL- CERVICAL TWO INSTRUMENTATION AND FUSION; EXTENSION TO CERVICAL FIVE;  Surgeon: Dawley, Theodoro Doing, DO;  Location: [place];  Service: Neurosurgery;  Laterality: N/A;  ? THORACIC FUSION  07/26/2017  ? TONSILLECTOMY    ? ? ?There were no vitals filed for this visit. ? ? Subjective Assessment - 01/29/22 1515   ? ? Subjective Good   ? Currently in Pain? No/denies   ? ?  ?  ? ?  ? ? ? ? ? [facility] PT Assessment - 01/29/22 0001   ? ?  ? Assessment  ? Medical Diagnosis Neck pain   ? Referring Provider (PT) Lovorn   ? ?  ?  ? ?  ? ? ? ? ? ? ? ? ? ? ? ? ? ? ? ?  [place] Adult PT Treatment/Exercise - 01/29/22 0001   ? ?  ? Neck Exercises: Standing  ? Other Standing Exercises Paloff press with 10# resistance, 1 x 10 reps each side.   ?  ? Lumbar Exercises: Aerobic  ? Nustep L5 x 6 minutes   ?  ? Lumbar Exercises: Machines for Strengthening  ? Cybex Knee Extension 5lb 2x10   ? Cybex Knee Flexion 20lb 2x10   ?  ? Lumbar Exercises: Standing  ? Row Strengthening;Both;20 reps;Theraband   ? Theraband Level (Row) Level 3 (Green)   ? Shoulder Extension Strengthening;Both;20 reps;Theraband   ? Theraband Level (Shoulder Extension) Level 3 (Green)   ? Other Standing Lumbar Exercises resisted gait 20lb 4 way x 3 each   ?  ? Manual Therapy  ? Manual Therapy Soft tissue mobilization   ? Soft tissue mobilization left upper trap   ? ?  ?  ? ?  ? ? ? Trigger Point Dry Needling - 01/29/22 0001   ? ? Consent Given? Yes   ? ?  ?  ? ?  ? ? ? ? ? ? ? ? ? ? PT Short Term Goals - 01/29/22 1550    ? ?  ? PT SHORT TERM GOAL #1  ? Title I with initial HEP   ? Status Achieved   ? ?  ?  ? ?  ? ? ? ? PT Long Term Goals - 01/29/22 1551   ? ?  ? PT LONG TERM GOAL #1  ? Title independent with HEP and how to progress herself   ? Status Achieved   ? ?  ?  ? ?  ? ? ? ? ? ? ? ? Plan - 01/29/22 1551   ? ? Clinical Impression Statement Pt enters feeling well reporting positive response form DN and would like to receive it again. Lead PT assisted in treatment providing pt with DN. Pt able to tolerate a more progressed therapy session. Increase LE fatigue reported with resisted side steps. Core weakness present with pall of press. Tactile cues for posture needed with rows and extensions.   ? Personal Factors and Comorbidities Past/Current Experience;Comorbidity 1   ? Examination-Activity Limitations Locomotion Level;Reach Overhead;Bend;Sit;Sleep;Carry;Squat;Stairs;Lift   ? Examination-Participation Restrictions Cleaning;Laundry   ? PT Frequency 2x / week   ? PT Duration 6 weeks   ? PT Treatment/Interventions ADLs/Self Care Home Management;DME Instruction;Neuromuscular re-education;Manual techniques;Balance training;Therapeutic exercise;Therapeutic activities;Functional mobility training;Stair training;Gait training;Dry needling;Passive range of motion;Moist Heat;Iontophoresis '4mg'$ /ml Dexamethasone   ? PT Next Visit Plan Assess DN progress as tolerated   ? ?  ?  ? ?  ? ? ?Patient will benefit from skilled therapeutic intervention in order to improve the following deficits and impairments:  Abnormal gait, Decreased range of motion, Difficulty walking, Pain, Decreased mobility, Decreased strength, Postural dysfunction, Improper body mechanics, Impaired flexibility, Increased muscle spasms ? ?Visit Diagnosis: ?Muscle weakness (generalized) - Plan: PT plan of care cert/re-cert ? ?Unsteadiness on feet - Plan: PT plan of care cert/re-cert ? ?Difficulty in walking, not elsewhere classified - Plan: PT plan of care  cert/re-cert ? ? ? ? ?Problem List ?Patient Active Problem List  ? Diagnosis Date Noted  ? Myofascial pain 12/17/2021  ? Hepatic cirrhosis ([place]) 06/26/2021  ? Fracture of C1 vertebra, closed ([place]) 06/09/2021  ? Closed C1 fracture ([place]) 06/05/2021  ? Plantar fasciitis of left foot 08/21/2018  ? Acute renal failure (ARF) ([place]) 02/09/2018  ? Chronic migraine without aura without status migrainosus, not intractable 06/16/2017  ?  History of lumbar fusion 08/03/2016  ?  Class: Chronic  ? Migraine 04/09/2016  ? Acute-on-chronic kidney injury ([place]) 09/07/2015  ? Hypertension 09/07/2015  ? Hypothyroidism 09/07/2015  ? Gout 09/07/2015  ? Low back pain 09/07/2015  ? Obesity (BMI 30-39.9) 07/06/2013  ? Headache 04/12/2013  ? Concussion 04/12/2013  ? Lumbar spinal stenosis 03/04/2012  ?  Class: Diagnosis of  ? ? Sumner Boast, PT ?01/29/2022, 4:16 PM ? ?[facility] ?[address] ?[place], [place], [address] ?Phone: [phone]   Fax:  [phone] ? ?Name: KEYSHIA ORWICK ?MRN: [id number] ?Date of Birth: 1946-05-10 ? ? ? ?

## 2022-02-03 ENCOUNTER — Encounter: Payer: Self-pay | Admitting: Physical Therapy

## 2022-02-03 ENCOUNTER — Ambulatory Visit: Payer: Medicare Other | Admitting: Physical Therapy

## 2022-02-03 DIAGNOSIS — M5412 Radiculopathy, cervical region: Secondary | ICD-10-CM | POA: Diagnosis not present

## 2022-02-03 DIAGNOSIS — M6281 Muscle weakness (generalized): Secondary | ICD-10-CM

## 2022-02-03 DIAGNOSIS — R2681 Unsteadiness on feet: Secondary | ICD-10-CM

## 2022-02-03 DIAGNOSIS — R262 Difficulty in walking, not elsewhere classified: Secondary | ICD-10-CM

## 2022-02-03 NOTE — Therapy (Signed)
[facility] ?[place] ?[address] ?[place], [place], [address] ?Phone: [phone]   Fax:  [phone] ? ?Physical Therapy Treatment ? ?Patient Details  ?Name: Becky Hobbs ?MRN: [id number] ?Date of Birth: 1945-12-16 ?Referring Provider (PT): Lovorn ? ? ?Encounter Date: 02/03/2022 ? ? PT End of Session - 02/03/22 1552   ? ? Visit Number 13   ? Date for PT Re-Evaluation 02/28/22   ? PT Stop Time 1856   ? Activity Tolerance Patient tolerated treatment well   ? Behavior During Therapy [facility] for tasks assessed/performed   ? ?  ?  ? ?  ? ? ?Past Medical History:  ?Diagnosis Date  ? Anemia   ? Anxiety   ? panic attacks  ? Arthritis   ? Asthma   ? Blood transfusion   ? 1973--with twins birth  ? Bronchitis   ? Chronic kidney disease   ? Diabetes mellitus   ? borderline  ? GERD (gastroesophageal reflux disease)   ? H/O hiatal hernia   ? surgery fixed  ? Headache   ? migraines  ? Heart murmur   ? MVP  ? Hepatitis   ? Hep C  ? Hypertension   ? Hypothyroidism   ? Pneumonia   ? ? ?Past Surgical History:  ?Procedure Laterality Date  ? ABDOMINAL HYSTERECTOMY    ? APPENDECTOMY    ? APPLICATION OF INTRAOPERATIVE CT SCAN N/A 06/05/2021  ? Procedure: APPLICATION OF INTRAOPERATIVE CT SCAN;  Surgeon: Dawley, Theodoro Doing, DO;  Location: [place];  Service: Neurosurgery;  Laterality: N/A;  ? BACK SURGERY    ? bladder tack    ? BREAST CYST EXCISION    ? BREAST CYST INCISION AND DRAINAGE    ? BREAST SURGERY    ? BUNIONECTOMY    ? CHOLECYSTECTOMY    ? DILATION AND CURETTAGE OF UTERUS    ? ESOPHAGOGASTRODUODENOSCOPY (EGD) WITH PROPOFOL N/A 01/03/2019  ? Procedure: ESOPHAGOGASTRODUODENOSCOPY (EGD) WITH PROPOFOL;  Surgeon: Otis Brace, MD;  [facility];  Service: Gastroenterology;  Laterality: N/A;  ? FOREIGN BODY REMOVAL  01/03/2019  ? Procedure: FOREIGN BODY REMOVAL;  Surgeon: Otis Brace, MD;  [facility];  Service: Gastroenterology;;  ? FRACTURE SURGERY    ? left wrist  ?  HERNIA REPAIR    ? hiatal hernia  ? LUMBAR FUSION  07/26/2017  ? LUMBAR LAMINECTOMY  03/04/2012  ? Procedure: MICRODISCECTOMY LUMBAR LAMINECTOMY;  Surgeon: Jessy Oto, MD;  Location: [place];  Service: Orthopedics;  Laterality: N/A;  L3-4 central laminectomy  ? NASAL SEPTUM SURGERY    ? nissan fundoplication    ? OVARIAN CYST SURGERY    ? POSTERIOR CERVICAL FUSION/FORAMINOTOMY N/A 06/05/2021  ? Procedure: OCCIPITAL- CERVICAL TWO INSTRUMENTATION AND FUSION; EXTENSION TO CERVICAL FIVE;  Surgeon: Dawley, Theodoro Doing, DO;  Location: [place];  Service: Neurosurgery;  Laterality: N/A;  ? THORACIC FUSION  07/26/2017  ? TONSILLECTOMY    ? ? ?There were no vitals filed for this visit. ? ? Subjective Assessment - 02/03/22 1515   ? ? Subjective ok, just tired   ? Currently in Pain? No/denies   ? ?  ?  ? ?  ? ? ? ? ? ? ? ? ? ? ? ? ? ? ? ? ? ? ? ? [place] Adult PT Treatment/Exercise - 02/03/22 0001   ? ?  ? High Level Balance  ? High Level Balance Comments side step on an off airex   ?  ?  Lumbar Exercises: Aerobic  ? Nustep L5 x 6 minutes   ?  ? Lumbar Exercises: Machines for Strengthening  ? Cybex Knee Extension 5lb 2x10   ? Cybex Knee Flexion 20lb 2x10   ? Other Lumbar Machine Exercise Rows & Lats 15lb 2x10   ?  ? Manual Therapy  ? Manual Therapy Soft tissue mobilization   ? Soft tissue mobilization left upper trap   ? ?  ?  ? ?  ? ? ? Trigger Point Dry Needling - 02/03/22 0001   ? ? Consent Given? Yes   ? Upper Trapezius Response Twitch reponse elicited;Palpable increased muscle length   ? ?  ?  ? ?  ? ? ? ? ? ? ? ? ? ? PT Short Term Goals - 01/29/22 1550   ? ?  ? PT SHORT TERM GOAL #1  ? Title I with initial HEP   ? Status Achieved   ? ?  ?  ? ?  ? ? ? ? PT Long Term Goals - 01/29/22 1551   ? ?  ? PT LONG TERM GOAL #1  ? Title independent with HEP and how to progress herself   ? Status Achieved   ? ?  ?  ? ?  ? ? ? ? ? ? ? ? Plan - 02/03/22 1553   ? ? Clinical Impression Statement Pt enters reporting increase fatigue today. Againt  lead PT assisted in treatment session providing patient with DN. She did well with all machine level interventions. CGA needed todaus tihw side step on airex due to instability. Tactiel cue to Tspine to prevent posterior leaning with seated rows.   ? Personal Factors and Comorbidities Past/Current Experience;Comorbidity 1   ? Comorbidities multiple back surgeries.   ? Examination-Activity Limitations Locomotion Level;Reach Overhead;Bend;Sit;Sleep;Carry;Squat;Stairs;Lift   ? Examination-Participation Restrictions Cleaning;Laundry   ? Stability/Clinical Decision Making Stable/Uncomplicated   ? Rehab Potential Good   ? PT Frequency 2x / week   ? PT Duration 6 weeks   ? PT Treatment/Interventions ADLs/Self Care Home Management;DME Instruction;Neuromuscular re-education;Manual techniques;Balance training;Therapeutic exercise;Therapeutic activities;Functional mobility training;Stair training;Gait training;Dry needling;Passive range of motion;Moist Heat;Iontophoresis '4mg'$ /ml Dexamethasone   ? PT Next Visit Plan Assess DN progress as tolerated   ? ?  ?  ? ?  ? ? ?Patient will benefit from skilled therapeutic intervention in order to improve the following deficits and impairments:  Abnormal gait, Decreased range of motion, Difficulty walking, Pain, Decreased mobility, Decreased strength, Postural dysfunction, Improper body mechanics, Impaired flexibility, Increased muscle spasms ? ?Visit Diagnosis: ?Muscle weakness (generalized) ? ?Unsteadiness on feet ? ?Difficulty in walking, not elsewhere classified ? ? ? ? ?Problem List ?Patient Active Problem List  ? Diagnosis Date Noted  ? Myofascial pain 12/17/2021  ? Hepatic cirrhosis ([place]) 06/26/2021  ? Fracture of C1 vertebra, closed ([place]) 06/09/2021  ? Closed C1 fracture ([place]) 06/05/2021  ? Plantar fasciitis of left foot 08/21/2018  ? Acute renal failure (ARF) ([place]) 02/09/2018  ? Chronic migraine without aura without status migrainosus, not intractable 06/16/2017  ? History of  lumbar fusion 08/03/2016  ?  Class: Chronic  ? Migraine 04/09/2016  ? Acute-on-chronic kidney injury ([place]) 09/07/2015  ? Hypertension 09/07/2015  ? Hypothyroidism 09/07/2015  ? Gout 09/07/2015  ? Low back pain 09/07/2015  ? Obesity (BMI 30-39.9) 07/06/2013  ? Headache 04/12/2013  ? Concussion 04/12/2013  ? Lumbar spinal stenosis 03/04/2012  ?  Class: Diagnosis of  ? ? [place]ner Boast, PT ?02/03/2022, 4:06 PM ? ?[place] ?  [place] ?[address] ?[place], [place], [address] ?Phone: [phone]   Fax:  [phone] ? ?Name: Becky Hobbs ?MRN: [id number] ?Date of Birth: 17-Oct-1945 ? ? ? ?

## 2022-02-03 NOTE — Therapy (Signed)
[facility] ?[place] ?[address] ?[place], [place], [address] ?Phone: [phone]   Fax:  [phone] ? ?Physical Therapy Treatment ? ?Patient Details  ?Name: Becky Hobbs ?MRN: [id number] ?Date of Birth: January 14, 1946 ?Referring Provider (PT): Lovorn ? ? ?Encounter Date: 02/03/2022 ? ? PT End of Session - 02/03/22 1552   ? ? Visit Number 13   ? Date for PT Re-Evaluation 02/28/22   ? PT Stop Time 3220   ? Activity Tolerance Patient tolerated treatment well   ? Behavior During Therapy [facility] for tasks assessed/performed   ? ?  ?  ? ?  ? ? ?Past Medical History:  ?Diagnosis Date  ? Anemia   ? Anxiety   ? panic attacks  ? Arthritis   ? Asthma   ? Blood transfusion   ? 1973--with twins birth  ? Bronchitis   ? Chronic kidney disease   ? Diabetes mellitus   ? borderline  ? GERD (gastroesophageal reflux disease)   ? H/O hiatal hernia   ? surgery fixed  ? Headache   ? migraines  ? Heart murmur   ? MVP  ? Hepatitis   ? Hep C  ? Hypertension   ? Hypothyroidism   ? Pneumonia   ? ? ?Past Surgical History:  ?Procedure Laterality Date  ? ABDOMINAL HYSTERECTOMY    ? APPENDECTOMY    ? APPLICATION OF INTRAOPERATIVE CT SCAN N/A 06/05/2021  ? Procedure: APPLICATION OF INTRAOPERATIVE CT SCAN;  Surgeon: Dawley, Theodoro Doing, DO;  Location: [place];  Service: Neurosurgery;  Laterality: N/A;  ? BACK SURGERY    ? bladder tack    ? BREAST CYST EXCISION    ? BREAST CYST INCISION AND DRAINAGE    ? BREAST SURGERY    ? BUNIONECTOMY    ? CHOLECYSTECTOMY    ? DILATION AND CURETTAGE OF UTERUS    ? ESOPHAGOGASTRODUODENOSCOPY (EGD) WITH PROPOFOL N/A 01/03/2019  ? Procedure: ESOPHAGOGASTRODUODENOSCOPY (EGD) WITH PROPOFOL;  Surgeon: Otis Brace, MD;  [facility];  Service: Gastroenterology;  Laterality: N/A;  ? FOREIGN BODY REMOVAL  01/03/2019  ? Procedure: FOREIGN BODY REMOVAL;  Surgeon: Otis Brace, MD;  [facility];  Service: Gastroenterology;;  ? FRACTURE SURGERY    ? left wrist  ?  HERNIA REPAIR    ? hiatal hernia  ? LUMBAR FUSION  07/26/2017  ? LUMBAR LAMINECTOMY  03/04/2012  ? Procedure: MICRODISCECTOMY LUMBAR LAMINECTOMY;  Surgeon: Jessy Oto, MD;  Location: [place];  Service: Orthopedics;  Laterality: N/A;  L3-4 central laminectomy  ? NASAL SEPTUM SURGERY    ? nissan fundoplication    ? OVARIAN CYST SURGERY    ? POSTERIOR CERVICAL FUSION/FORAMINOTOMY N/A 06/05/2021  ? Procedure: OCCIPITAL- CERVICAL TWO INSTRUMENTATION AND FUSION; EXTENSION TO CERVICAL FIVE;  Surgeon: Dawley, Theodoro Doing, DO;  Location: [place];  Service: Neurosurgery;  Laterality: N/A;  ? THORACIC FUSION  07/26/2017  ? TONSILLECTOMY    ? ? ?There were no vitals filed for this visit. ? ? Subjective Assessment - 02/03/22 1515   ? ? Subjective ok, just tired   ? Currently in Pain? No/denies   ? ?  ?  ? ?  ? ? ? ? ? ? ? ? ? ? ? ? ? ? ? ? ? ? ? ? [place] Adult PT Treatment/Exercise - 02/03/22 0001   ? ?  ? High Level Balance  ? High Level Balance Comments side step on an off airex   ?  ?  Lumbar Exercises: Aerobic  ? Nustep L5 x 6 minutes   ?  ? Lumbar Exercises: Machines for Strengthening  ? Cybex Knee Extension 5lb 2x10   ? Cybex Knee Flexion 20lb 2x10   ? Other Lumbar Machine Exercise Rows & Lats 15lb 2x10   ?  ? Manual Therapy  ? Manual Therapy Soft tissue mobilization   ? Soft tissue mobilization left upper trap   ? ?  ?  ? ?  ? ? ? Trigger Point Dry Needling - 02/03/22 0001   ? ? Consent Given? Yes   ? Upper Trapezius Response Twitch reponse elicited;Palpable increased muscle length   ? ?  ?  ? ?  ? ? ? ? ? ? ? ? ? ? PT Short Term Goals - 01/29/22 1550   ? ?  ? PT SHORT TERM GOAL #1  ? Title I with initial HEP   ? Status Achieved   ? ?  ?  ? ?  ? ? ? ? PT Long Term Goals - 01/29/22 1551   ? ?  ? PT LONG TERM GOAL #1  ? Title independent with HEP and how to progress herself   ? Status Achieved   ? ?  ?  ? ?  ? ? ? ? ? ? ? ? Plan - 02/03/22 1553   ? ? Clinical Impression Statement Pt enters reporting increase fatigue today. Against  lead PT assisted in treatment session providing patient with DN. She did well with all machine level interventions. CGA needed today with side step on airex due to instability. Tactile cue to Tspine to prevent posterior leaning with seated rows.   ? Personal Factors and Comorbidities Past/Current Experience;Comorbidity 1   ? Comorbidities multiple back surgeries.   ? Examination-Activity Limitations Locomotion Level;Reach Overhead;Bend;Sit;Sleep;Carry;Squat;Stairs;Lift   ? Examination-Participation Restrictions Cleaning;Laundry   ? Stability/Clinical Decision Making Stable/Uncomplicated   ? Rehab Potential Good   ? PT Frequency 2x / week   ? PT Duration 6 weeks   ? PT Treatment/Interventions ADLs/Self Care Home Management;DME Instruction;Neuromuscular re-education;Manual techniques;Balance training;Therapeutic exercise;Therapeutic activities;Functional mobility training;Stair training;Gait training;Dry needling;Passive range of motion;Moist Heat;Iontophoresis '4mg'$ /ml Dexamethasone   ? PT Next Visit Plan Assess DN progress as tolerated   ? ?  ?  ? ?  ? ? ?Patient will benefit from skilled therapeutic intervention in order to improve the following deficits and impairments:  Abnormal gait, Decreased range of motion, Difficulty walking, Pain, Decreased mobility, Decreased strength, Postural dysfunction, Improper body mechanics, Impaired flexibility, Increased muscle spasms ? ?Visit Diagnosis: ?Muscle weakness (generalized) ? ?Unsteadiness on feet ? ?Difficulty in walking, not elsewhere classified ? ? ? ? ?Problem List ?Patient Active Problem List  ? Diagnosis Date Noted  ? Myofascial pain 12/17/2021  ? Hepatic cirrhosis ([place]) 06/26/2021  ? Fracture of C1 vertebra, closed ([place]) 06/09/2021  ? Closed C1 fracture ([place]) 06/05/2021  ? Plantar fasciitis of left foot 08/21/2018  ? Acute renal failure (ARF) ([place]) 02/09/2018  ? Chronic migraine without aura without status migrainosus, not intractable 06/16/2017  ? History of  lumbar fusion 08/03/2016  ?  Class: Chronic  ? Migraine 04/09/2016  ? Acute-on-chronic kidney injury ([place]) 09/07/2015  ? Hypertension 09/07/2015  ? Hypothyroidism 09/07/2015  ? Gout 09/07/2015  ? Low back pain 09/07/2015  ? Obesity (BMI 30-39.9) 07/06/2013  ? Headache 04/12/2013  ? Concussion 04/12/2013  ? Lumbar spinal stenosis 03/04/2012  ?  Class: Diagnosis of  ? ? ?Scot Jun, PTA ?02/03/2022, 4:07 PM ? ?[facility] ?[address] ?[address] ?[place], [place], [address] ?Phone: [phone]   Fax:  [phone] ? ?Name: Becky Hobbs ?MRN: [id number] ?Date of Birth: [birth year] ? ? ? ?

## 2022-02-05 ENCOUNTER — Ambulatory Visit: Payer: Medicare Other | Admitting: Physical Therapy

## 2022-02-09 ENCOUNTER — Ambulatory Visit
Admission: RE | Admit: 2022-02-09 | Discharge: 2022-02-09 | Disposition: A | Discharge status: Home / Self Care | Payer: Medicare Other | Source: Ambulatory Visit | Attending: Internal Medicine | Admitting: Internal Medicine

## 2022-02-09 ENCOUNTER — Other Ambulatory Visit: Payer: Self-pay | Admitting: Internal Medicine

## 2022-02-09 DIAGNOSIS — Z1231 Encounter for screening mammogram for malignant neoplasm of breast: Secondary | ICD-10-CM

## 2022-02-10 ENCOUNTER — Encounter: Payer: Self-pay | Admitting: Physical Therapy

## 2022-02-10 ENCOUNTER — Ambulatory Visit: Payer: Medicare Other | Attending: Physical Medicine and Rehabilitation | Admitting: Physical Therapy

## 2022-02-10 DIAGNOSIS — R2681 Unsteadiness on feet: Secondary | ICD-10-CM | POA: Insufficient documentation

## 2022-02-10 DIAGNOSIS — R262 Difficulty in walking, not elsewhere classified: Secondary | ICD-10-CM | POA: Diagnosis present

## 2022-02-10 DIAGNOSIS — R278 Other lack of coordination: Secondary | ICD-10-CM | POA: Insufficient documentation

## 2022-02-10 DIAGNOSIS — M6281 Muscle weakness (generalized): Secondary | ICD-10-CM | POA: Diagnosis present

## 2022-02-10 NOTE — Therapy (Signed)
[place] ?[place] [address] ?[place], [place], [address] ?Phone: [phone]   Fax:  [phone] ? ?Physical Therapy Treatment ? ?Patient Details  ?Name: Becky Hobbs ?MRN: [id number] ?Date of Birth: 15-Mar-1946 ?Referring Provider (PT): Lovorn ? ? ?Encounter Date: 02/10/2022 ? ? PT End of Session - 02/10/22 1554   ? ? Visit Number 14   ? Date for PT Re-Evaluation 02/28/22   ? PT Start Time 1515   ? PT Stop Time 2671   ? PT Time Calculation (min) 52 min   ? Activity Tolerance Patient tolerated treatment well   ? Behavior During Therapy [facility] for tasks assessed/performed   ? ?  ?  ? ?  ? ? ?Past Medical History:  ?Diagnosis Date  ? Anemia   ? Anxiety   ? panic attacks  ? Arthritis   ? Asthma   ? Blood transfusion   ? 1973--with twins birth  ? Bronchitis   ? Chronic kidney disease   ? Diabetes mellitus   ? borderline  ? GERD (gastroesophageal reflux disease)   ? H/O hiatal hernia   ? surgery fixed  ? Headache   ? migraines  ? Heart murmur   ? MVP  ? Hepatitis   ? Hep C  ? Hypertension   ? Hypothyroidism   ? Pneumonia   ? ? ?Past Surgical History:  ?Procedure Laterality Date  ? ABDOMINAL HYSTERECTOMY    ? APPENDECTOMY    ? APPLICATION OF INTRAOPERATIVE CT SCAN N/A 06/05/2021  ? Procedure: APPLICATION OF INTRAOPERATIVE CT SCAN;  Surgeon: Dawley, Theodoro Doing, DO;  Location: [place];  Service: Neurosurgery;  Laterality: N/A;  ? BACK SURGERY    ? bladder tack    ? BREAST CYST EXCISION    ? BREAST CYST INCISION AND DRAINAGE    ? BREAST SURGERY    ? BUNIONECTOMY    ? CHOLECYSTECTOMY    ? DILATION AND CURETTAGE OF UTERUS    ? ESOPHAGOGASTRODUODENOSCOPY (EGD) WITH PROPOFOL N/A 01/03/2019  ? Procedure: ESOPHAGOGASTRODUODENOSCOPY (EGD) WITH PROPOFOL;  Surgeon: Otis Brace, MD;  [facility];  Service: Gastroenterology;  Laterality: N/A;  ? FOREIGN BODY REMOVAL  01/03/2019  ? Procedure: FOREIGN BODY REMOVAL;  Surgeon: Otis Brace, MD;  [facility];  Service:  Gastroenterology;;  ? FRACTURE SURGERY    ? left wrist  ? HERNIA REPAIR    ? hiatal hernia  ? LUMBAR FUSION  07/26/2017  ? LUMBAR LAMINECTOMY  03/04/2012  ? Procedure: MICRODISCECTOMY LUMBAR LAMINECTOMY;  Surgeon: Jessy Oto, MD;  Location: [place];  Service: Orthopedics;  Laterality: N/A;  L3-4 central laminectomy  ? NASAL SEPTUM SURGERY    ? nissan fundoplication    ? OVARIAN CYST SURGERY    ? POSTERIOR CERVICAL FUSION/FORAMINOTOMY N/A 06/05/2021  ? Procedure: OCCIPITAL- CERVICAL TWO INSTRUMENTATION AND FUSION; EXTENSION TO CERVICAL FIVE;  Surgeon: Dawley, Theodoro Doing, DO;  Location: [place];  Service: Neurosurgery;  Laterality: N/A;  ? THORACIC FUSION  07/26/2017  ? TONSILLECTOMY    ? ? ?There were no vitals filed for this visit. ? ? Subjective Assessment - 02/10/22 1518   ? ? Subjective Terrible, had to take all the sheets off her bed. Thinks she pulled her neck out this morning moving all the pillows.   ? Currently in Pain? No/denies   ? ?  ?  ? ?  ? ? ? ? ? ? ? ? ? ? ? ? ? ? ? ? ? ? ? ?  [place] Adult PT Treatment/Exercise - 02/10/22 0001   ? ?  ? Neck Exercises: Seated  ? Other Seated Exercise Shoulder Er red 2x10   ? Other Seated Exercise Horiz abd red 2x10   ?  ? Lumbar Exercises: Aerobic  ? Nustep L5 x 6 minutes   ?  ? Lumbar Exercises: Machines for Strengthening  ? Cybex Knee Extension 5lb 2x10   ? Cybex Knee Flexion 20lb 2x15   ?  ? Manual Therapy  ? Manual Therapy Soft tissue mobilization   ? Soft tissue mobilization left upper trap   ? ?  ?  ? ?  ? ? ? Trigger Point Dry Needling - 02/10/22 0001   ? ? Consent Given? Yes   ? Upper Trapezius Response Twitch reponse elicited;Palpable increased muscle length   ? ?  ?  ? ?  ? ? ? ? ? ? ? ? ? ? PT Short Term Goals - 01/29/22 1550   ? ?  ? PT SHORT TERM GOAL #1  ? Title I with initial HEP   ? Status Achieved   ? ?  ?  ? ?  ? ? ? ? PT Long Term Goals - 02/10/22 1555   ? ?  ? PT LONG TERM GOAL #1  ? Title independent with HEP and how to progress herself   ? Status Achieved    ?  ? PT LONG TERM GOAL #2  ? Title Perform TUG in <10 sec to demosntrate imporved balance and confidence.   ? Status Achieved   ?  ? PT LONG TERM GOAL #3  ? Title Paitent will score at least 19/24 on DGI.   ? Status On-going   ?  ? PT LONG TERM GOAL #4  ? Title Patient will be able to get through her day with pain </+3/10   ? Status Achieved   ? ?  ?  ? ?  ? ? ? ? ? ? ? ? Plan - 02/10/22 1556   ? ? Clinical Impression Statement Pt arrived with no pain but report some discomfort in her neck from hanging pulling all the bed sheets off her bed. Increase weight and or reps tolerated with seated leg curls and extensions. Some fatigue reported with sit to stands. Tactile cue to elbows for positioning needed with externals rotation. Cue also needed to keep arms up with horizontal abduction. Again lead PT assisted in session providing PT with DN   ? Personal Factors and Comorbidities Past/Current Experience;Comorbidity 1   ? Comorbidities multiple back surgeries.   ? Examination-Activity Limitations Locomotion Level;Reach Overhead;Bend;Sit;Sleep;Carry;Squat;Stairs;Lift   ? Examination-Participation Restrictions Cleaning;Laundry   ? Rehab Potential Good   ? PT Frequency 2x / week   ? PT Duration 6 weeks   ? PT Treatment/Interventions ADLs/Self Care Home Management;DME Instruction;Neuromuscular re-education;Manual techniques;Balance training;Therapeutic exercise;Therapeutic activities;Functional mobility training;Stair training;Gait training;Dry needling;Passive range of motion;Moist Heat;Iontophoresis '4mg'$ /ml Dexamethasone   ? PT Next Visit Plan Assess DN progress as tolerated   ? ?  ?  ? ?  ? ? ?Patient will benefit from skilled therapeutic intervention in order to improve the following deficits and impairments:  Abnormal gait, Decreased range of motion, Difficulty walking, Pain, Decreased mobility, Decreased strength, Postural dysfunction, Improper body mechanics, Impaired flexibility, Increased muscle spasms ? ?Visit  Diagnosis: ?Muscle weakness (generalized) ? ?Unsteadiness on feet ? ?Difficulty in walking, not elsewhere classified ? ? ? ? ?Problem List ?Patient Active Problem List  ? Diagnosis Date Noted  ? Myofascial pain  12/17/2021  ? Hepatic cirrhosis ([place]) 06/26/2021  ? Fracture of C1 vertebra, closed ([place]) 06/09/2021  ? Closed C1 fracture ([place]) 06/05/2021  ? Plantar fasciitis of left foot 08/21/2018  ? Acute renal failure (ARF) ([place]) 02/09/2018  ? Chronic migraine without aura without status migrainosus, not intractable 06/16/2017  ? History of lumbar fusion 08/03/2016  ?  Class: Chronic  ? Migraine 04/09/2016  ? Acute-on-chronic kidney injury ([place]) 09/07/2015  ? Hypertension 09/07/2015  ? Hypothyroidism 09/07/2015  ? Gout 09/07/2015  ? Low back pain 09/07/2015  ? Obesity (BMI 30-39.9) 07/06/2013  ? Headache 04/12/2013  ? Concussion 04/12/2013  ? Lumbar spinal stenosis 03/04/2012  ?  Class: Diagnosis of  ? ? ?Scot Jun, PTA ?02/10/2022, 4:03 PM ? ?[facility] ?[address] ?[address] ?[place], [place], [address] ?Phone: [phone]   Fax:  [phone] ? ?Name: Becky Hobbs ?MRN: [id number] ?Date of Birth: Feb 16, 1946 ? ? ? ?

## 2022-02-10 NOTE — Therapy (Deleted)
[facility] ?[place] [address] ?[place], [place], [address] ?Phone: [phone]   Fax:  [phone] ? ?Physical Therapy Treatment ? ?Patient Details  ?Name: Becky Hobbs ?MRN: [id number] ?Date of Birth: Sep 04, 1946 ?Referring Provider (PT): Lovorn ? ? ?Encounter Date: 02/10/2022 ? ? PT End of Session - 02/10/22 1554   ? ? Visit Number 14   ? Date for PT Re-Evaluation 02/28/22   ? PT Start Time 1515   ? PT Stop Time 3295   ? PT Time Calculation (min) 52 min   ? Activity Tolerance Patient tolerated treatment well   ? Behavior During Therapy [facility] for tasks assessed/performed   ? ?  ?  ? ?  ? ? ?Past Medical History:  ?Diagnosis Date  ? Anemia   ? Anxiety   ? panic attacks  ? Arthritis   ? Asthma   ? Blood transfusion   ? 1973--with twins birth  ? Bronchitis   ? Chronic kidney disease   ? Diabetes mellitus   ? borderline  ? GERD (gastroesophageal reflux disease)   ? H/O hiatal hernia   ? surgery fixed  ? Headache   ? migraines  ? Heart murmur   ? MVP  ? Hepatitis   ? Hep C  ? Hypertension   ? Hypothyroidism   ? Pneumonia   ? ? ?Past Surgical History:  ?Procedure Laterality Date  ? ABDOMINAL HYSTERECTOMY    ? APPENDECTOMY    ? APPLICATION OF INTRAOPERATIVE CT SCAN N/A 06/05/2021  ? Procedure: APPLICATION OF INTRAOPERATIVE CT SCAN;  Surgeon: Dawley, Theodoro Doing, DO;  Location: [place];  Service: Neurosurgery;  Laterality: N/A;  ? BACK SURGERY    ? bladder tack    ? BREAST CYST EXCISION    ? BREAST CYST INCISION AND DRAINAGE    ? BREAST SURGERY    ? BUNIONECTOMY    ? CHOLECYSTECTOMY    ? DILATION AND CURETTAGE OF UTERUS    ? ESOPHAGOGASTRODUODENOSCOPY (EGD) WITH PROPOFOL N/A 01/03/2019  ? Procedure: ESOPHAGOGASTRODUODENOSCOPY (EGD) WITH PROPOFOL;  Surgeon: Otis Brace, MD;  [facility];  Service: Gastroenterology;  Laterality: N/A;  ? FOREIGN BODY REMOVAL  01/03/2019  ? Procedure: FOREIGN BODY REMOVAL;  Surgeon: Otis Brace, MD;  [facility];  Service:  Gastroenterology;;  ? FRACTURE SURGERY    ? left wrist  ? HERNIA REPAIR    ? hiatal hernia  ? LUMBAR FUSION  07/26/2017  ? LUMBAR LAMINECTOMY  03/04/2012  ? Procedure: MICRODISCECTOMY LUMBAR LAMINECTOMY;  Surgeon: Jessy Oto, MD;  Location: [place];  Service: Orthopedics;  Laterality: N/A;  L3-4 central laminectomy  ? NASAL SEPTUM SURGERY    ? nissan fundoplication    ? OVARIAN CYST SURGERY    ? POSTERIOR CERVICAL FUSION/FORAMINOTOMY N/A 06/05/2021  ? Procedure: OCCIPITAL- CERVICAL TWO INSTRUMENTATION AND FUSION; EXTENSION TO CERVICAL FIVE;  Surgeon: Dawley, Theodoro Doing, DO;  Location: [facility];  Service: Neurosurgery;  Laterality: N/A;  ? THORACIC FUSION  07/26/2017  ? TONSILLECTOMY    ? ? ?There were no vitals filed for this visit. ? ? Subjective Assessment - 02/10/22 1518   ? ? Subjective Terrible, had to take all the sheets off her bed. Thinks she pulled her neck out this morning moving all the pillows.   ? Currently in Pain? No/denies   ? ?  ?  ? ?  ? ? ? ? ? ? ? ? ? ? ? ? ? ? ? ? ? ? ? ?  [place] Adult PT Treatment/Exercise - 02/10/22 0001   ? ?  ? Neck Exercises: Seated  ? Other Seated Exercise Shoulder Er red 2x10   ? Other Seated Exercise Horiz abd red 2x10   ?  ? Lumbar Exercises: Aerobic  ? Nustep L5 x 6 minutes   ?  ? Lumbar Exercises: Machines for Strengthening  ? Cybex Knee Extension 5lb 2x10   ? Cybex Knee Flexion 20lb 2x15   ? ?  ?  ? ?  ? ? ? ? ? ? ? ? ? ? ? ? PT Short Term Goals - 01/29/22 1550   ? ?  ? PT SHORT TERM GOAL #1  ? Title I with initial HEP   ? Status Achieved   ? ?  ?  ? ?  ? ? ? ? PT Long Term Goals - 02/10/22 1555   ? ?  ? PT LONG TERM GOAL #1  ? Title independent with HEP and how to progress herself   ? Status Achieved   ?  ? PT LONG TERM GOAL #2  ? Title Perform TUG in <10 sec to demosntrate imporved balance and confidence.   ? Status Achieved   ?  ? PT LONG TERM GOAL #3  ? Title Paitent will score at least 19/24 on DGI.   ? Status On-going   ?  ? PT LONG TERM GOAL #4  ? Title Patient will be  able to get through her day with pain </+3/10   ? Status Achieved   ? ?  ?  ? ?  ? ? ? ? ? ? ? ? Plan - 02/10/22 1556   ? ? Clinical Impression Statement Pt arrived with no pain but report some discomfort in her neck from hanging pulling all the bed sheets off her bed. Increase weight and or reps tolerated with seated leg curls and extensions. Some fatigue reported with sit to stands. Tactile cue to elbows for positioning needed with externals rotation. Cue also needed to keep arms up with horizontal abduction. Again lead PT assisted in session providing PT with DN   ? Personal Factors and Comorbidities Past/Current Experience;Comorbidity 1   ? Comorbidities multiple back surgeries.   ? Examination-Activity Limitations Locomotion Level;Reach Overhead;Bend;Sit;Sleep;Carry;Squat;Stairs;Lift   ? Examination-Participation Restrictions Cleaning;Laundry   ? Rehab Potential Good   ? PT Frequency 2x / week   ? PT Duration 6 weeks   ? PT Treatment/Interventions ADLs/Self Care Home Management;DME Instruction;Neuromuscular re-education;Manual techniques;Balance training;Therapeutic exercise;Therapeutic activities;Functional mobility training;Stair training;Gait training;Dry needling;Passive range of motion;Moist Heat;Iontophoresis '4mg'$ /ml Dexamethasone   ? PT Next Visit Plan Assess DN progress as tolerated   ? ?  ?  ? ?  ? ? ?Patient will benefit from skilled therapeutic intervention in order to improve the following deficits and impairments:  Abnormal gait, Decreased range of motion, Difficulty walking, Pain, Decreased mobility, Decreased strength, Postural dysfunction, Improper body mechanics, Impaired flexibility, Increased muscle spasms ? ?Visit Diagnosis: ?Muscle weakness (generalized) ? ?Unsteadiness on feet ? ?Difficulty in walking, not elsewhere classified ? ? ? ? ?Problem List ?Patient Active Problem List  ? Diagnosis Date Noted  ? Myofascial pain 12/17/2021  ? Hepatic cirrhosis ([place]) 06/26/2021  ? Fracture of C1  vertebra, closed ([place]) 06/09/2021  ? Closed C1 fracture ([place]) 06/05/2021  ? Plantar fasciitis of left foot 08/21/2018  ? Acute renal failure (ARF) ([place]) 02/09/2018  ? Chronic migraine without aura without status migrainosus, not intractable 06/16/2017  ? History of lumbar fusion 08/03/2016  ?  Class: Chronic  ? Migraine 04/09/2016  ? Acute-on-chronic kidney injury ([place]) 09/07/2015  ? Hypertension 09/07/2015  ? Hypothyroidism 09/07/2015  ? Gout 09/07/2015  ? Low back pain 09/07/2015  ? Obesity (BMI 30-39.9) 07/06/2013  ? Headache 04/12/2013  ? Concussion 04/12/2013  ? Lumbar spinal stenosis 03/04/2012  ?  Class: Diagnosis of  ? ? ?Scot Jun, PTA ?02/10/2022, 3:58 PM ? ?[facility] ?[address] ?[place], [place], [address] ?Phone: [phone]   Fax:  [phone] ? ?Name: Becky Hobbs ?MRN: [id number] ?Date of Birth: December 15, 1945 ? ? ? ?

## 2022-02-12 ENCOUNTER — Ambulatory Visit: Payer: Medicare Other | Admitting: Physical Therapy

## 2022-02-17 ENCOUNTER — Ambulatory Visit: Payer: Medicare Other | Admitting: Physical Therapy

## 2022-02-19 ENCOUNTER — Ambulatory Visit: Payer: Medicare Other | Admitting: Physical Therapy

## 2022-02-19 ENCOUNTER — Encounter: Payer: Self-pay | Admitting: Physical Therapy

## 2022-02-19 DIAGNOSIS — R2681 Unsteadiness on feet: Secondary | ICD-10-CM

## 2022-02-19 DIAGNOSIS — M6281 Muscle weakness (generalized): Secondary | ICD-10-CM | POA: Diagnosis not present

## 2022-02-19 DIAGNOSIS — R278 Other lack of coordination: Secondary | ICD-10-CM

## 2022-02-19 DIAGNOSIS — R262 Difficulty in walking, not elsewhere classified: Secondary | ICD-10-CM

## 2022-02-19 NOTE — Therapy (Signed)
[facility] ?[place] ?[address] ?[place], [place], [address] ?Phone: [phone]   Fax:  [phone] ? ?Physical Therapy Treatment ? ?Patient Details  ?Name: Becky Hobbs ?MRN: [id number] ?Date of Birth: 03-May-1946 ?Referring Provider (PT): Lovorn ? ? ?Encounter Date: 02/19/2022 ? ? PT End of Session - 02/19/22 1437   ? ? Visit Number 15   ? Date for PT Re-Evaluation 02/28/22   ? PT Start Time 9371   ? PT Stop Time 6967   ? PT Time Calculation (min) 39 min   ? Equipment Utilized During Treatment Gait belt   ? Activity Tolerance Patient tolerated treatment well   ? Behavior During Therapy [facility] for tasks assessed/performed   ? ?  ?  ? ?  ? ? ?Past Medical History:  ?Diagnosis Date  ? Anemia   ? Anxiety   ? panic attacks  ? Arthritis   ? Asthma   ? Blood transfusion   ? 1973--with twins birth  ? Bronchitis   ? Chronic kidney disease   ? Diabetes mellitus   ? borderline  ? GERD (gastroesophageal reflux disease)   ? H/O hiatal hernia   ? surgery fixed  ? Headache   ? migraines  ? Heart murmur   ? MVP  ? Hepatitis   ? Hep C  ? Hypertension   ? Hypothyroidism   ? Pneumonia   ? ? ?Past Surgical History:  ?Procedure Laterality Date  ? ABDOMINAL HYSTERECTOMY    ? APPENDECTOMY    ? APPLICATION OF INTRAOPERATIVE CT SCAN N/A 06/05/2021  ? Procedure: APPLICATION OF INTRAOPERATIVE CT SCAN;  Surgeon: Dawley, Theodoro Doing, DO;  Location: [place];  Service: Neurosurgery;  Laterality: N/A;  ? BACK SURGERY    ? bladder tack    ? BREAST CYST EXCISION    ? BREAST CYST INCISION AND DRAINAGE    ? BREAST SURGERY    ? BUNIONECTOMY    ? CHOLECYSTECTOMY    ? DILATION AND CURETTAGE OF UTERUS    ? ESOPHAGOGASTRODUODENOSCOPY (EGD) WITH PROPOFOL N/A 01/03/2019  ? Procedure: ESOPHAGOGASTRODUODENOSCOPY (EGD) WITH PROPOFOL;  Surgeon: Otis Brace, MD;  [facility];  Service: Gastroenterology;  Laterality: N/A;  ? FOREIGN BODY REMOVAL  01/03/2019  ? Procedure: FOREIGN BODY REMOVAL;  Surgeon:  Otis Brace, MD;  Location: [facility];  Service: Gastroenterology;;  ? FRACTURE SURGERY    ? left wrist  ? HERNIA REPAIR    ? hiatal hernia  ? LUMBAR FUSION  07/26/2017  ? LUMBAR LAMINECTOMY  03/04/2012  ? Procedure: MICRODISCECTOMY LUMBAR LAMINECTOMY;  Surgeon: Jessy Oto, MD;  Location: [facility];  Service: Orthopedics;  Laterality: N/A;  L3-4 central laminectomy  ? NASAL SEPTUM SURGERY    ? nissan fundoplication    ? OVARIAN CYST SURGERY    ? POSTERIOR CERVICAL FUSION/FORAMINOTOMY N/A 06/05/2021  ? Procedure: OCCIPITAL- CERVICAL TWO INSTRUMENTATION AND FUSION; EXTENSION TO CERVICAL FIVE;  Surgeon: Dawley, Theodoro Doing, DO;  Location: [facility];  Service: Neurosurgery;  Laterality: N/A;  ? THORACIC FUSION  07/26/2017  ? TONSILLECTOMY    ? ? ?There were no vitals filed for this visit. ? ? Subjective Assessment - 02/19/22 1411   ? ? Subjective Patient reports improved back pain, neck still sore. She feels the DN is helping. she feels she still needs to emphasize balance.   ? Pertinent History Multiple back surgeries, recent neck fusion, 8/22, with therapy afterwards.   ? Currently in Pain? No/denies   ? ?  ?  ? ?  ? ? ? ? ? ? ? ? ? ? ? ? ? ? ? ? ? ? ? ?  [facility] Adult PT Treatment/Exercise - 02/19/22 0001   ? ?  ? Lumbar Exercises: Aerobic  ? Recumbent Bike L3 x 6 minutes.   ?  ? Lumbar Exercises: Machines for Strengthening  ? Cybex Knee Flexion 25# 2 x 10   ?  ? Lumbar Exercises: Standing  ? Other Standing Lumbar Exercises B side stepping and alternate taps on cone while standing on mat for a compliant surface.   ?  ? Manual Therapy  ? Manual Therapy Soft tissue mobilization   ? Soft tissue mobilization left upper trap   ? ?  ?  ? ?  ? ? ? Trigger Point Dry Needling - 02/19/22 0001   ? ? Consent Given? Yes   ? Upper Trapezius Response Twitch reponse elicited;Palpable increased muscle length   ? ?  ?  ? ?  ? ? ? ? ? ? ? ? ? ? PT Short Term Goals - 01/29/22 1550   ? ?  ? PT SHORT TERM GOAL #1  ? Title I with initial HEP   ?  Status Achieved   ? ?  ?  ? ?  ? ? ? ? PT Long Term Goals - 02/19/22 1429   ? ?  ? PT LONG TERM GOAL #1  ? Title independent with HEP and how to progress herself   ? Status Achieved   ?  ? PT LONG TERM GOAL #2  ? Title Perform TUG in <10 sec to demosntrate imporved balance and confidence.   ? Status Achieved   ?  ? PT LONG TERM GOAL #3  ? Title Paitent will score at least 19/24 on DGI.   ? Time 1   ? Period Weeks   ? Status On-going   ? Target Date 02/26/22   ?  ? PT LONG TERM GOAL #4  ? Title Patient will be able to get through her day with pain </+3/10   ? Status Achieved   ? ?  ?  ? ?  ? ? ? ? ? ? ? ? Plan - 02/19/22 1432   ? ? Clinical Impression Statement Patient reports no back pain, but remains stiff. She wants to continue to work on her balance. She feels her HEP continues to appropriately challenge her. Treatment focused on standing activities on compliant surface, alternating taps on cones, occasional min guard A, but overall stable. Plan to re-assess for D/C next visit.   ? Personal Factors and Comorbidities Past/Current Experience;Comorbidity 1   ? Comorbidities multiple back surgeries.   ? Examination-Activity Limitations Locomotion Level;Reach Overhead;Bend;Sit;Sleep;Carry;Squat;Stairs;Lift   ? Examination-Participation Restrictions Cleaning;Laundry   ? Stability/Clinical Decision Making Stable/Uncomplicated   ? Clinical Decision Making Low   ? Rehab Potential Good   ? PT Frequency 2x / week   ? PT Duration 6 weeks   ? PT Treatment/Interventions ADLs/Self Care Home Management;DME Instruction;Neuromuscular re-education;Manual techniques;Balance training;Therapeutic exercise;Therapeutic activities;Functional mobility training;Stair training;Gait training;Dry needling;Passive range of motion;Moist Heat;Iontophoresis '4mg'$ /ml Dexamethasone   ? PT Next Visit Plan Re-assess for D/C. Possible DN?   ? PT Home Exercise Plan Access Code: [email or web address]   ? Consulted and Agree with Plan of Care Patient   ? ?  ?  ? ?   ? ? ?Patient will benefit from skilled therapeutic intervention in order to improve the following deficits and impairments:  Abnormal gait, Decreased range of motion, Difficulty walking, Pain, Decreased mobility, Decreased strength, Postural dysfunction, Improper body mechanics, Impaired flexibility, Increased muscle spasms ? ?Visit Diagnosis: ?Muscle weakness (generalized) ? ?  Unsteadiness on feet ? ?Difficulty in walking, not elsewhere classified ? ?Other lack of coordination ? ? ? ? ?Problem List ?Patient Active Problem List  ? Diagnosis Date Noted  ? Myofascial pain 12/17/2021  ? Hepatic cirrhosis ([place]) 06/26/2021  ? Fracture of C1 vertebra, closed ([place]) 06/09/2021  ? Closed C1 fracture ([place]) 06/05/2021  ? Plantar fasciitis of left foot 08/21/2018  ? Acute renal failure (ARF) ([place]) 02/09/2018  ? Chronic migraine without aura without status migrainosus, not intractable 06/16/2017  ? History of lumbar fusion 08/03/2016  ?  Class: Chronic  ? Migraine 04/09/2016  ? Acute-on-chronic kidney injury ([place]) 09/07/2015  ? Hypertension 09/07/2015  ? Hypothyroidism 09/07/2015  ? Gout 09/07/2015  ? Low back pain 09/07/2015  ? Obesity (BMI 30-39.9) 07/06/2013  ? Headache 04/12/2013  ? Concussion 04/12/2013  ? Lumbar spinal stenosis 03/04/2012  ?  Class: Diagnosis of  ? ? ?Marcelina Morel, DPT ?02/19/2022, 3:15 PM ? ?[facility] ?[address] ?[address] ?[place], [place], [address] ?Phone: [phone]   Fax:  [phone] ? ?Name: Becky Hobbs ?MRN: [id number] ?Date of Birth: 1946-01-10 ? ? ? ?

## 2022-02-26 ENCOUNTER — Ambulatory Visit: Payer: Medicare Other | Admitting: Physical Therapy

## 2022-02-26 ENCOUNTER — Encounter: Payer: Self-pay | Admitting: Physical Therapy

## 2022-02-26 DIAGNOSIS — R262 Difficulty in walking, not elsewhere classified: Secondary | ICD-10-CM

## 2022-02-26 DIAGNOSIS — R2681 Unsteadiness on feet: Secondary | ICD-10-CM

## 2022-02-26 DIAGNOSIS — M6281 Muscle weakness (generalized): Secondary | ICD-10-CM

## 2022-02-26 NOTE — Therapy (Signed)
[facility]. [place], [place], [address] Phone: [phone]   Fax:  [phone]  Physical Therapy Treatment  Patient Details  Name: Becky Hobbs MRN: [id number] Date of Birth: 08-Nov-1945 Referring Provider (PT): Lovorn   Encounter Date: 02/26/2022   PT End of Session - 02/26/22 1551     Visit Number 16    Date for PT Re-Evaluation 02/28/22    PT Start Time 1515    PT Stop Time 1555    PT Time Calculation (min) 40 min    Activity Tolerance Patient tolerated treatment well    Behavior During Therapy WFL for tasks assessed/performed             Past Medical History:  Diagnosis Date   Anemia    Anxiety    panic attacks   Arthritis    Asthma    Blood transfusion    1973--with twins birth   Bronchitis    Chronic kidney disease    Diabetes mellitus    borderline   GERD (gastroesophageal reflux disease)    H/O hiatal hernia    surgery fixed   Headache    migraines   Heart murmur    MVP   Hepatitis    Hep C   Hypertension    Hypothyroidism    Pneumonia     Past Surgical History:  Procedure Laterality Date   ABDOMINAL HYSTERECTOMY     APPENDECTOMY     APPLICATION OF INTRAOPERATIVE CT SCAN N/A 06/05/2021   Procedure: APPLICATION OF INTRAOPERATIVE CT SCAN;  Surgeon: Karsten Ro, DO;  Location: [facility];  Service: Neurosurgery;  Laterality: N/A;   BACK SURGERY     bladder tack     BREAST CYST EXCISION     BREAST CYST INCISION AND DRAINAGE     BREAST SURGERY     BUNIONECTOMY     CHOLECYSTECTOMY     DILATION AND CURETTAGE OF UTERUS     ESOPHAGOGASTRODUODENOSCOPY (EGD) WITH PROPOFOL N/A 01/03/2019   Procedure: ESOPHAGOGASTRODUODENOSCOPY (EGD) WITH PROPOFOL;  Surgeon: Otis Brace, MD;  Location: [facility];  Service: Gastroenterology;  Laterality: N/A;   FOREIGN BODY REMOVAL  01/03/2019   Procedure: FOREIGN BODY REMOVAL;  Surgeon: Otis Brace, MD;  Location: [facility];  Service:  Gastroenterology;;   FRACTURE SURGERY     left wrist   HERNIA REPAIR     hiatal hernia   LUMBAR FUSION  07/26/2017   LUMBAR LAMINECTOMY  03/04/2012   Procedure: MICRODISCECTOMY LUMBAR LAMINECTOMY;  Surgeon: Jessy Oto, MD;  Location: [facility];  Service: Orthopedics;  Laterality: N/A;  L3-4 central laminectomy   NASAL SEPTUM SURGERY     nissan fundoplication     OVARIAN CYST SURGERY     POSTERIOR CERVICAL FUSION/FORAMINOTOMY N/A 06/05/2021   Procedure: OCCIPITAL- CERVICAL TWO INSTRUMENTATION AND FUSION; EXTENSION TO CERVICAL FIVE;  Surgeon: Karsten Ro, DO;  Location: [facility];  Service: Neurosurgery;  Laterality: N/A;   THORACIC FUSION  07/26/2017   TONSILLECTOMY      There were no vitals filed for this visit.   Subjective Assessment - 02/26/22 1523     Subjective Good, no pain    Currently in Pain? No/denies                               [facility] Adult PT Treatment/Exercise - 02/26/22 0001       Neck Exercises: Seated  Other Seated Exercise Shoulder Er red 2x10    Other Seated Exercise Horiz abd red 2x10      Lumbar Exercises: Aerobic   Nustep L5 x 6 minutes      Lumbar Exercises: Machines for Strengthening   Cybex Knee Extension 5lb 2x10    Cybex Knee Flexion 25# 2 x 10              Trigger Point Dry Needling - 02/26/22 0001     Consent Given? Yes    Upper Trapezius Response Twitch reponse elicited;Palpable increased muscle length                     PT Short Term Goals - 01/29/22 1550       PT SHORT TERM GOAL #1   Title I with initial HEP    Status Achieved               PT Long Term Goals - 02/26/22 1552       PT LONG TERM GOAL #1   Title independent with HEP and how to progress herself    Status Achieved      PT LONG TERM GOAL #2   Title Perform TUG in <10 sec to demosntrate imporved balance and confidence.    Status Achieved      PT LONG TERM GOAL #3   Title Paitent will score at least 19/24 on DGI.     Status On-going      PT LONG TERM GOAL #4   Title Patient will be able to get through her day with pain </+3/10    Status Achieved                   Plan - 02/26/22 1553     Clinical Impression Statement Pt enters feeling well with no pain. She reports no functional limitations at home and is pleased with her current functional status. No issue completing today's interventions. Tactile cue to elbows needed to maintain arm positioning with seated external rotation. Pt ha progressed meeting most goals. Lead PT assisted in sessions providing pt with dry needling.    Personal Factors and Comorbidities Past/Current Experience;Comorbidity 1    Comorbidities multiple back surgeries.    Examination-Activity Limitations Locomotion Level;Reach Overhead;Bend;Sit;Sleep;Carry;Squat;Stairs;Lift    Examination-Participation Restrictions Cleaning;Laundry             Patient will benefit from skilled therapeutic intervention in order to improve the following deficits and impairments:     Visit Diagnosis: Muscle weakness (generalized)  Unsteadiness on feet  Difficulty in walking, not elsewhere classified     Problem List Patient Active Problem List   Diagnosis Date Noted   Myofascial pain 12/17/2021   Hepatic cirrhosis ([place]) 06/26/2021   Fracture of C1 vertebra, closed ([place]) 06/09/2021   Closed C1 fracture ([place]) 06/05/2021   Plantar fasciitis of left foot 08/21/2018   Acute renal failure (ARF) ([place]) 02/09/2018   Chronic migraine without aura without status migrainosus, not intractable 06/16/2017   History of lumbar fusion 08/03/2016    Class: Chronic   Migraine 04/09/2016   Acute-on-chronic kidney injury ([place]) 09/07/2015   Hypertension 09/07/2015   Hypothyroidism 09/07/2015   Gout 09/07/2015   Low back pain 09/07/2015   Obesity (BMI 30-39.9) 07/06/2013   Headache 04/12/2013   Concussion 04/12/2013   Lumbar spinal stenosis 03/04/2012    Class: Diagnosis of   PHYSICAL  THERAPY DISCHARGE SUMMARY  Visits from Start of Care: 16  Patient agrees to discharge. Patient goals were met. Patient is being discharged due to being pleased with the current functional level.  Sumner Boast, PT 02/26/2022, 4:18 PM  [facility]. [place], [place], [address] Phone: [phone]   Fax:  [phone]  Name: Becky Hobbs MRN: [id number] Date of Birth: 25-May-1946

## 2022-04-03 ENCOUNTER — Encounter
Payer: Medicare Other | Attending: Physical Medicine and Rehabilitation | Admitting: Physical Medicine and Rehabilitation

## 2022-04-03 ENCOUNTER — Encounter: Payer: Self-pay | Admitting: Physical Medicine and Rehabilitation

## 2022-04-03 VITALS — BP 116/72 | HR 74 | Ht 64.0 in | Wt 215.6 lb

## 2022-04-03 DIAGNOSIS — S12001G Unspecified nondisplaced fracture of first cervical vertebra, subsequent encounter for fracture with delayed healing: Secondary | ICD-10-CM

## 2022-04-03 DIAGNOSIS — S12000S Unspecified displaced fracture of first cervical vertebra, sequela: Secondary | ICD-10-CM

## 2022-04-03 DIAGNOSIS — M4712 Other spondylosis with myelopathy, cervical region: Secondary | ICD-10-CM

## 2022-04-03 DIAGNOSIS — M7918 Myalgia, other site: Secondary | ICD-10-CM | POA: Diagnosis present

## 2022-04-03 DIAGNOSIS — Z9889 Other specified postprocedural states: Secondary | ICD-10-CM

## 2022-04-03 DIAGNOSIS — N319 Neuromuscular dysfunction of bladder, unspecified: Secondary | ICD-10-CM | POA: Diagnosis present

## 2022-04-03 DIAGNOSIS — M542 Cervicalgia: Secondary | ICD-10-CM | POA: Diagnosis present

## 2022-04-09 IMAGING — MG DIGITAL SCREENING BILAT W/ TOMO W/ CAD
6 of 10 series · 6 of 30 positions shown · non-contrast
Comparison: Previous exam(s).

CLINICAL DATA: Screening.

EXAM:
DIGITAL SCREENING BILATERAL MAMMOGRAM WITH TOMO AND CAD

[R CC synth-2D (1 of 2)]
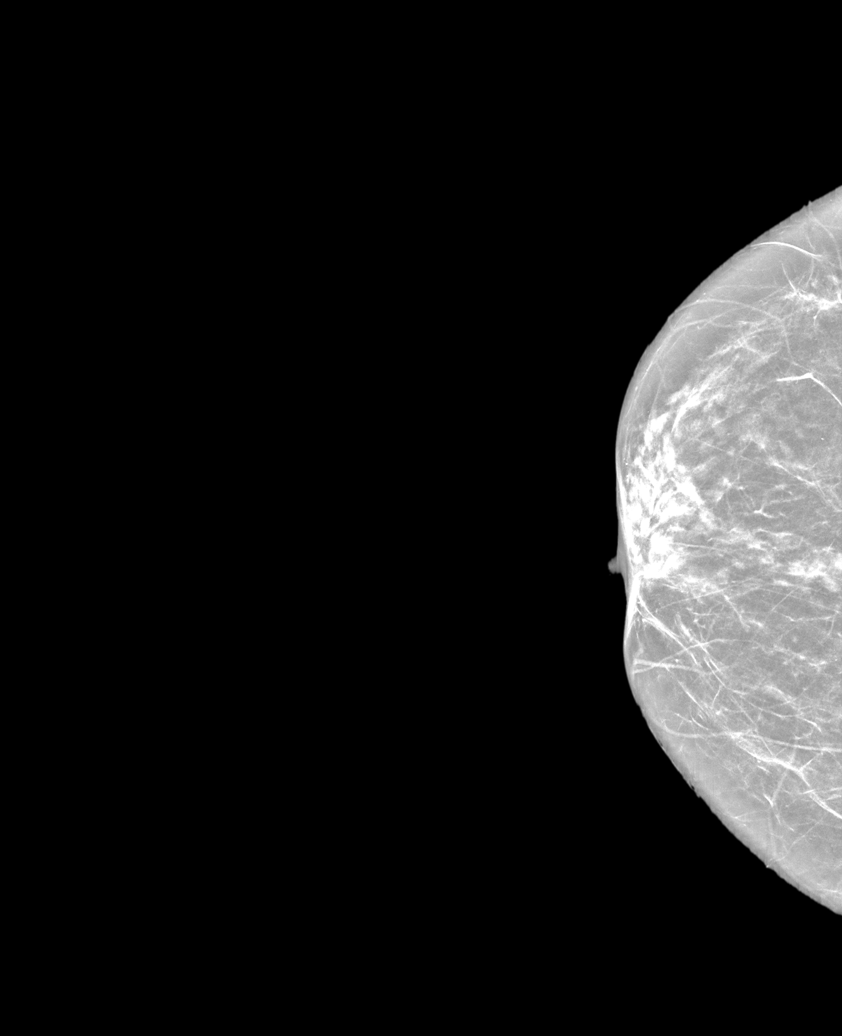

[R CC synth-2D (2 of 2)]
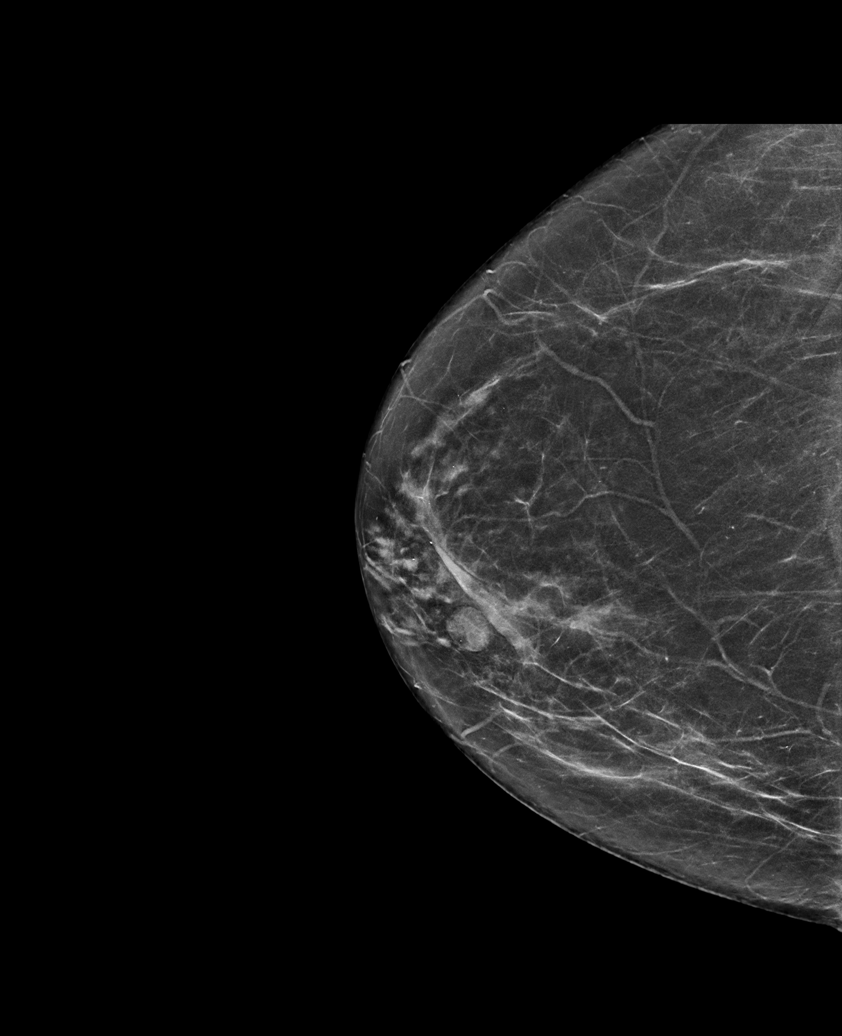

[L CC synth-2D]
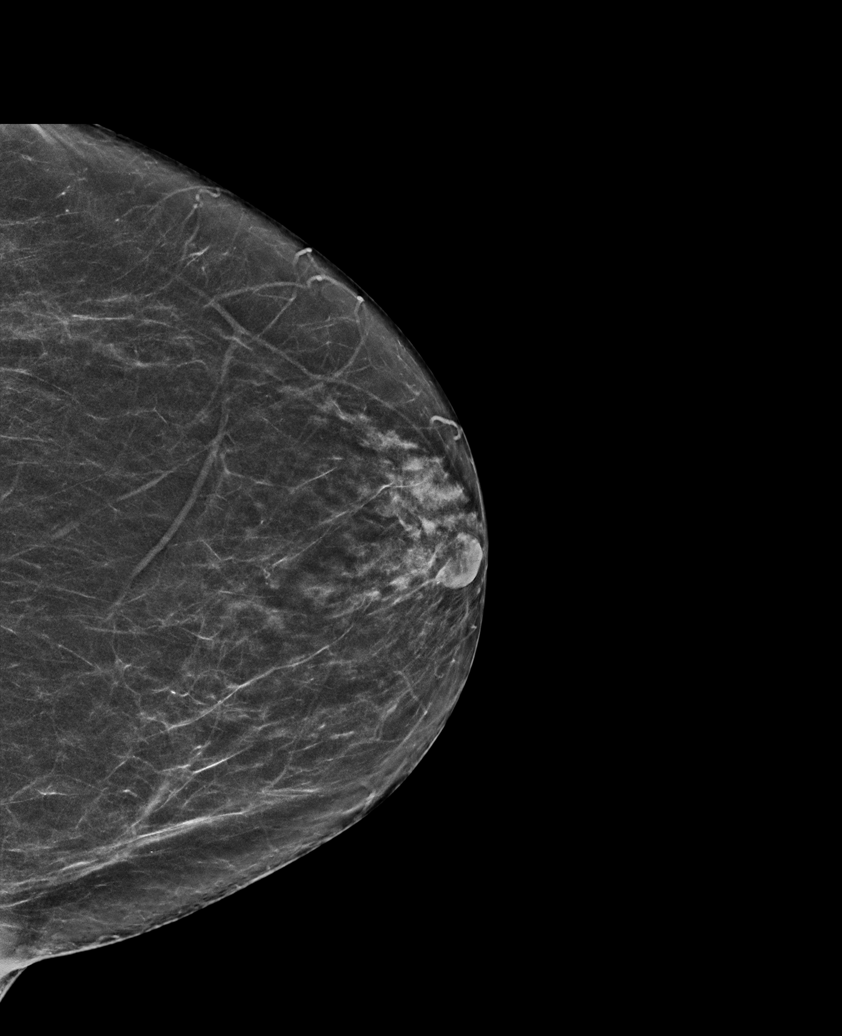

[L MLO synth-2D]
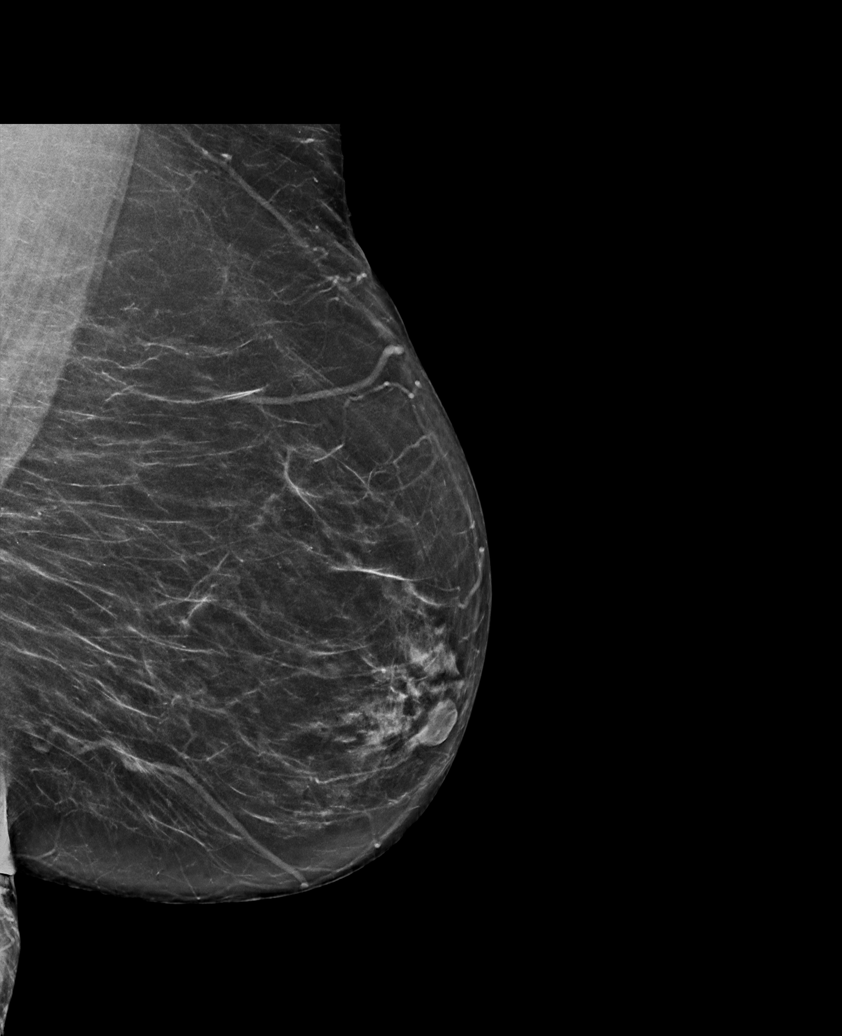

[R MLO synth-2D]
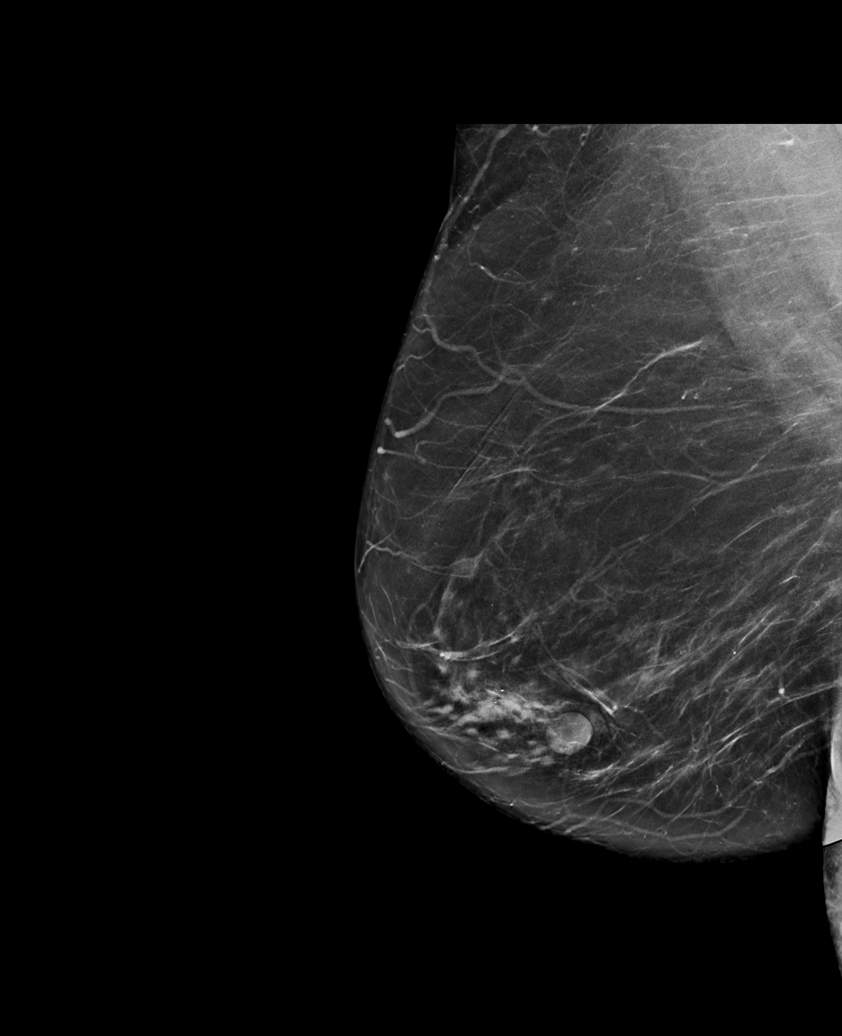

[L MLO tomo · tomo slice 35/69.0]
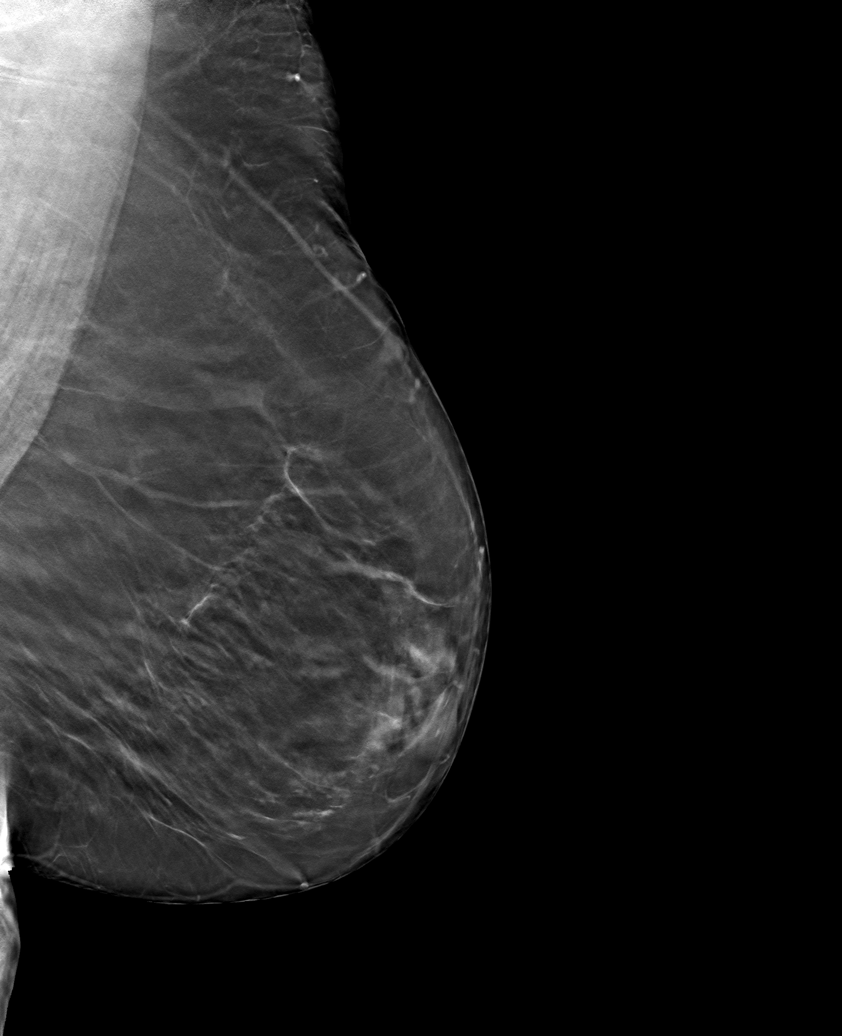

[6 of 30 positions shown; findings below may reference images not displayed]

ACR Breast Density Category b: There are scattered areas of
fibroglandular density.
FINDINGS: There are no findings suspicious for malignancy. Images were
processed with CAD.
IMPRESSION: No mammographic evidence of malignancy. A result letter of this
screening mammogram will be mailed directly to the patient.

RECOMMENDATION:
Screening mammogram in one year. (Code:[id number])

BI-RADS CATEGORY  1: Negative.

## 2022-04-22 ENCOUNTER — Ambulatory Visit: Payer: Medicare Other | Attending: Physical Medicine and Rehabilitation

## 2022-04-22 DIAGNOSIS — R278 Other lack of coordination: Secondary | ICD-10-CM

## 2022-04-22 DIAGNOSIS — G8929 Other chronic pain: Secondary | ICD-10-CM | POA: Diagnosis present

## 2022-04-22 DIAGNOSIS — R2681 Unsteadiness on feet: Secondary | ICD-10-CM

## 2022-04-22 DIAGNOSIS — M5442 Lumbago with sciatica, left side: Secondary | ICD-10-CM | POA: Diagnosis present

## 2022-04-22 DIAGNOSIS — M7918 Myalgia, other site: Secondary | ICD-10-CM | POA: Diagnosis not present

## 2022-04-22 DIAGNOSIS — M5412 Radiculopathy, cervical region: Secondary | ICD-10-CM | POA: Diagnosis present

## 2022-04-22 DIAGNOSIS — M6281 Muscle weakness (generalized): Secondary | ICD-10-CM

## 2022-04-22 DIAGNOSIS — M542 Cervicalgia: Secondary | ICD-10-CM | POA: Diagnosis not present

## 2022-04-22 DIAGNOSIS — M4712 Other spondylosis with myelopathy, cervical region: Secondary | ICD-10-CM | POA: Insufficient documentation

## 2022-04-22 DIAGNOSIS — R262 Difficulty in walking, not elsewhere classified: Secondary | ICD-10-CM | POA: Diagnosis present

## 2022-04-22 DIAGNOSIS — Z9889 Other specified postprocedural states: Secondary | ICD-10-CM | POA: Diagnosis not present

## 2022-04-22 NOTE — Therapy (Signed)
OUTPATIENT PHYSICAL THERAPY CERVICAL EVALUATION   Patient Name: Becky Hobbs MRN: [id number] DOB:12-31-1945, 76 y.o., female Today's Date: 04/22/2022   PT End of Session - 04/22/22 1020     Visit Number 1    Date for PT Re-Evaluation 06/24/22    PT Start Time 1020    PT Stop Time 1101    PT Time Calculation (min) 41 min    Activity Tolerance Patient tolerated treatment well;Patient limited by pain    Behavior During Therapy WFL for tasks assessed/performed             Past Medical History:  Diagnosis Date   Anemia    Anxiety    panic attacks   Arthritis    Asthma    Blood transfusion    1973--with twins birth   Bronchitis    Chronic kidney disease    Diabetes mellitus    borderline   GERD (gastroesophageal reflux disease)    H/O hiatal hernia    surgery fixed   Headache    migraines   Heart murmur    MVP   Hepatitis    Hep C   Hypertension    Hypothyroidism    Pneumonia    Past Surgical History:  Procedure Laterality Date   ABDOMINAL HYSTERECTOMY     APPENDECTOMY     APPLICATION OF INTRAOPERATIVE CT SCAN N/A 06/05/2021   Procedure: APPLICATION OF INTRAOPERATIVE CT SCAN;  Surgeon: Karsten Ro, DO;  Location: [facility];  Service: Neurosurgery;  Laterality: N/A;   BACK SURGERY     bladder tack     BREAST CYST EXCISION     BREAST CYST INCISION AND DRAINAGE     BREAST SURGERY     BUNIONECTOMY     CHOLECYSTECTOMY     DILATION AND CURETTAGE OF UTERUS     ESOPHAGOGASTRODUODENOSCOPY (EGD) WITH PROPOFOL N/A 01/03/2019   Procedure: ESOPHAGOGASTRODUODENOSCOPY (EGD) WITH PROPOFOL;  Surgeon: Otis Brace, MD;  Location: [facility];  Service: Gastroenterology;  Laterality: N/A;   FOREIGN BODY REMOVAL  01/03/2019   Procedure: FOREIGN BODY REMOVAL;  Surgeon: Otis Brace, MD;  Location: [facility];  Service: Gastroenterology;;   FRACTURE SURGERY     left wrist   HERNIA REPAIR     hiatal hernia   LUMBAR FUSION  07/26/2017   LUMBAR LAMINECTOMY   03/04/2012   Procedure: MICRODISCECTOMY LUMBAR LAMINECTOMY;  Surgeon: Jessy Oto, MD;  Location: [facility];  Service: Orthopedics;  Laterality: N/A;  L3-4 central laminectomy   NASAL SEPTUM SURGERY     nissan fundoplication     OVARIAN CYST SURGERY     POSTERIOR CERVICAL FUSION/FORAMINOTOMY N/A 06/05/2021   Procedure: OCCIPITAL- CERVICAL TWO INSTRUMENTATION AND FUSION; EXTENSION TO CERVICAL FIVE;  Surgeon: Karsten Ro, DO;  Location: [facility];  Service: Neurosurgery;  Laterality: N/A;   THORACIC FUSION  07/26/2017   TONSILLECTOMY     Patient Active Problem List   Diagnosis Date Noted   Neurogenic bladder 04/03/2022   Spondylosis, cervical, with myelopathy 04/03/2022   Myofascial pain dysfunction syndrome 12/17/2021   Hepatic cirrhosis ([place]) 06/26/2021   Fracture of C1 vertebra, closed ([place]) 06/09/2021   Closed C1 fracture ([place]) 06/05/2021   Plantar fasciitis of left foot 08/21/2018   Acute renal failure (ARF) ([place]) 02/09/2018   Chronic migraine without aura without status migrainosus, not intractable 06/16/2017   History of lumbar fusion 08/03/2016    Class: Chronic   Migraine 04/09/2016   Acute-on-chronic kidney injury ([place]) 09/07/2015  Hypertension 09/07/2015   Hypothyroidism 09/07/2015   Gout 09/07/2015   Low back pain 09/07/2015   Obesity (BMI 30-39.9) 07/06/2013   Headache 04/12/2013   Concussion 04/12/2013   Lumbar spinal stenosis 03/04/2012    Class: Diagnosis of    PCP: Marton Redwood  REFERRING PROVIDER: Courtney Heys  REFERRING DIAG: M79.18, M54.2, Z98.890, M47.12  THERAPY DIAG:  Radiculopathy, cervical region  Muscle weakness (generalized)  Unsteadiness on feet  Difficulty in walking, not elsewhere classified  Other lack of coordination  Chronic right-sided low back pain with left-sided sciatica  Rationale for Evaluation and Treatment Rehabilitation  ONSET DATE: 04/03/22  SUBJECTIVE:                                                                                                                                                                                                          SUBJECTIVE STATEMENT: I am back here for my neck and my balance. Since I left and came back, I have fallen a lot and always fall to the right. No dizziness, its just I get off balance and I end up on the ground. Legrand Como gave me needles in my neck and back and it helped a lot.   PERTINENT HISTORY:  Lumbar fusions and laminectomy, cervical fusion 2022  PAIN:  Are you having pain? Yes: NPRS scale: 7/10 Pain location: neck Pain description: achy, it just hurts Aggravating factors: bending, stretching, reaching overhead Relieving factors: pain med sometimes but I do not like taking them  PRECAUTIONS: Fall  WEIGHT BEARING RESTRICTIONS No  FALLS:  Has patient fallen in last 6 months? Yes. Number of falls 5 or 6   LIVING ENVIRONMENT: Lives with: lives alone Lives in: House/apartment Stairs: Yes: Internal: 14 steps; on left going up Has following equipment at home: Single point cane and Walker - 2 wheeled  OCCUPATION: retired  PLOF: Independent  PATIENT GOALS get my neck and back fixed, get my balance  OBJECTIVE:   DIAGNOSTIC FINDINGS:  FINDINGS: Single cross-table lateral portable spot fluoro image obtained of the upper cervical spine. Support catheters are seen overlying the hypopharynx.   IMPRESSION: Intraoperative localization during posterior cervical fusion.  PATIENT SURVEYS:  FOTO 37   COGNITION: Overall cognitive status: Within functional limits for tasks assessed   SENSATION: WFL  POSTURE: rounded shoulders, forward head, increased thoracic kyphosis, and flexed trunk   PALPATION: TTP and tightness along c-spine and UTs   CERVICAL ROM:   Active ROM A/PROM (deg) eval  Flexion 75% w/pain  Extension Very limited due to pain  Right lateral  flexion Limited due to pain  Left lateral flexion Limited due to pain  Right rotation  20d w/pain  Left rotation 20d w/pain   (Blank rows = not tested)  UPPER EXTREMITY ROM:  Active ROM Right eval Left eval  Shoulder flexion 110 110  Shoulder extension    Shoulder abduction 90 90  Shoulder adduction    Shoulder extension    Shoulder internal rotation 55 50  Shoulder external rotation C7 C7  Elbow flexion    Elbow extension    Wrist flexion    Wrist extension    Wrist ulnar deviation    Wrist radial deviation    Wrist pronation    Wrist supination     (Blank rows = not tested)  UPPER EXTREMITY MMT:  grossly 4+/5 for all shoulder MMT and LE   FUNCTIONAL TESTS:  5 times sit to stand: 15.29s Timed up and go (TUG): 13.09s Berg Balance Scale: TBD  VESTIBULAR TESTING Smooth pursuits and saccades are normal, no nystagmus   TODAY'S TREATMENT:  POC and assessment  PATIENT EDUCATION:  Education details: POC Person educated: Patient Education method: Explanation Education comprehension: verbalized understanding   HOME EXERCISE PROGRAM: TBD, review her old HEP and revise  ASSESSMENT:  CLINICAL IMPRESSION: Patient is a 76 y.o. female who was seen today for physical therapy evaluation and treatment for neck pain. Patient denies dizziness but has trouble with her balance as well. She states she has had multiple falls since she left therapy and she always falls to the right. Patient has returned to receive dry needling and will benefit from skilled PT intervention to continue working on range of motion, strength, and balance.    OBJECTIVE IMPAIRMENTS Abnormal gait, decreased balance, decreased coordination, decreased ROM, decreased strength, decreased safety awareness, hypomobility, impaired flexibility, improper body mechanics, and pain.   ACTIVITY LIMITATIONS carrying, lifting, bending, sitting, standing, squatting, sleeping, stairs, reach over head, hygiene/grooming, and locomotion level  PARTICIPATION LIMITATIONS: meal prep, cleaning, laundry, community  activity, and yard work  PERSONAL FACTORS Age, Behavior pattern, Past/current experiences, and 1-2 comorbidities: hx of neck and back surgery, HTN, chronic kidney disease  are also affecting patient's functional outcome.   REHAB POTENTIAL: Fair    CLINICAL DECISION MAKING: Evolving/moderate complexity  EVALUATION COMPLEXITY: Moderate   GOALS: Goals reviewed with patient? No  SHORT TERM GOALS: Target date: 05/27/22  Patient will be independent with initial HEP.  Goal status: INITIAL  2.  Patient will report pain levels at worst to be at least 5/10  Baseline: 7/10 Goal status: INITIAL    LONG TERM GOALS: Target date: 06/24/22  1.  Patient will demonstrate full pain free cervical ROM for safety with driving.  Goal status: INITIAL  2.  Patient will demonstrate improved posture to decrease muscle imbalance. Goal status: INITIAL  3.  Patient will report 46 on FOTO (patient outcome measure)  to demonstrate improved functional ability.  Baseline: 37 Goal status: INITIAL  6.  Patient will increase BERG score by 10 points to demonstrate improved balance and decreased fall risk.  Goal status: INITIAL    PLAN: PT FREQUENCY: 2x/week  PT DURATION: 8 weeks  PLANNED INTERVENTIONS: Therapeutic exercises, Therapeutic activity, Neuromuscular re-education, Balance training, Gait training, Patient/Family education, Self Care, Joint mobilization, Dry Needling, Electrical stimulation, Spinal mobilization, Cryotherapy, Moist heat, Taping, Vasopneumatic device, Traction, Ultrasound, Ionotophoresis '4mg'$ /ml Dexamethasone, Manual therapy, and Re-evaluation  PLAN FOR NEXT SESSION: review old HEP, balance training, cervical ROM and strength training, BERG    Jacinda Kanady  Ozie Lupe, PT 04/22/2022, 11:58 AM

## 2022-04-28 ENCOUNTER — Encounter: Payer: Self-pay | Admitting: Physical Therapy

## 2022-04-28 ENCOUNTER — Ambulatory Visit: Payer: Medicare Other | Admitting: Physical Therapy

## 2022-04-28 DIAGNOSIS — M5412 Radiculopathy, cervical region: Secondary | ICD-10-CM

## 2022-04-28 DIAGNOSIS — R262 Difficulty in walking, not elsewhere classified: Secondary | ICD-10-CM

## 2022-04-28 DIAGNOSIS — M6281 Muscle weakness (generalized): Secondary | ICD-10-CM

## 2022-04-28 DIAGNOSIS — R2681 Unsteadiness on feet: Secondary | ICD-10-CM

## 2022-04-28 NOTE — Therapy (Signed)
OUTPATIENT PHYSICAL THERAPY CERVICAL EVALUATION   Patient Name: Becky Hobbs MRN: [id number] DOB:09-Sep-1946, 76 y.o., female Today's Date: 04/28/2022   PT End of Session - 04/28/22 0940     Visit Number 2    Date for PT Re-Evaluation 06/24/22    PT Start Time 0934    PT Stop Time 1015    PT Time Calculation (min) 41 min    Activity Tolerance Patient tolerated treatment well;Patient limited by pain    Behavior During Therapy [facility] for tasks assessed/performed             Past Medical History:  Diagnosis Date   Anemia    Anxiety    panic attacks   Arthritis    Asthma    Blood transfusion    1973--with twins birth   Bronchitis    Chronic kidney disease    Diabetes mellitus    borderline   GERD (gastroesophageal reflux disease)    H/O hiatal hernia    surgery fixed   Headache    migraines   Heart murmur    MVP   Hepatitis    Hep C   Hypertension    Hypothyroidism    Pneumonia    Past Surgical History:  Procedure Laterality Date   ABDOMINAL HYSTERECTOMY     APPENDECTOMY     APPLICATION OF INTRAOPERATIVE CT SCAN N/A 06/05/2021   Procedure: APPLICATION OF INTRAOPERATIVE CT SCAN;  Surgeon: Karsten Ro, DO;  Location: [place];  Service: Neurosurgery;  Laterality: N/A;   BACK SURGERY     bladder tack     BREAST CYST EXCISION     BREAST CYST INCISION AND DRAINAGE     BREAST SURGERY     BUNIONECTOMY     CHOLECYSTECTOMY     DILATION AND CURETTAGE OF UTERUS     ESOPHAGOGASTRODUODENOSCOPY (EGD) WITH PROPOFOL N/A 01/03/2019   Procedure: ESOPHAGOGASTRODUODENOSCOPY (EGD) WITH PROPOFOL;  Surgeon: Otis Brace, MD;  Location: [place];  Service: Gastroenterology;  Laterality: N/A;   FOREIGN BODY REMOVAL  01/03/2019   Procedure: FOREIGN BODY REMOVAL;  Surgeon: Otis Brace, MD;  Location: [facility];  Service: Gastroenterology;;   FRACTURE SURGERY     left wrist   HERNIA REPAIR     hiatal hernia   LUMBAR FUSION  07/26/2017   LUMBAR LAMINECTOMY   03/04/2012   Procedure: MICRODISCECTOMY LUMBAR LAMINECTOMY;  Surgeon: Jessy Oto, MD;  Location: [place];  Service: Orthopedics;  Laterality: N/A;  L3-4 central laminectomy   NASAL SEPTUM SURGERY     nissan fundoplication     OVARIAN CYST SURGERY     POSTERIOR CERVICAL FUSION/FORAMINOTOMY N/A 06/05/2021   Procedure: OCCIPITAL- CERVICAL TWO INSTRUMENTATION AND FUSION; EXTENSION TO CERVICAL FIVE;  Surgeon: Karsten Ro, DO;  Location: [place];  Service: Neurosurgery;  Laterality: N/A;   THORACIC FUSION  07/26/2017   TONSILLECTOMY     Patient Active Problem List   Diagnosis Date Noted   Neurogenic bladder 04/03/2022   Spondylosis, cervical, with myelopathy 04/03/2022   Myofascial pain dysfunction syndrome 12/17/2021   Hepatic cirrhosis ([place]) 06/26/2021   Fracture of C1 vertebra, closed ([place]) 06/09/2021   Closed C1 fracture ([place]) 06/05/2021   Plantar fasciitis of left foot 08/21/2018   Acute renal failure (ARF) ([place]) 02/09/2018   Chronic migraine without aura without status migrainosus, not intractable 06/16/2017   History of lumbar fusion 08/03/2016    Class: Chronic   Migraine 04/09/2016   Acute-on-chronic kidney injury ([place]) 09/07/2015  Hypertension 09/07/2015   Hypothyroidism 09/07/2015   Gout 09/07/2015   Low back pain 09/07/2015   Obesity (BMI 30-39.9) 07/06/2013   Headache 04/12/2013   Concussion 04/12/2013   Lumbar spinal stenosis 03/04/2012    Class: Diagnosis of    PCP: Marton Redwood  REFERRING PROVIDER: Courtney Heys  REFERRING DIAG: M79.18, M54.2, Z98.890, M47.12  THERAPY DIAG:  Radiculopathy, cervical region  Muscle weakness (generalized)  Unsteadiness on feet  Difficulty in walking, not elsewhere classified  Rationale for Evaluation and Treatment Rehabilitation  ONSET DATE: 04/03/22  SUBJECTIVE:                                                                                                                                                                                                          SUBJECTIVE STATEMENT: I just had too many falls and I really have difficulty getting up, I am weak, neck still hurts  PERTINENT HISTORY:  Lumbar fusions and laminectomy, cervical fusion 2022  PAIN:  Are you having pain? Yes: NPRS scale: 7/10 Pain location: neck Pain description: achy, it just hurts Aggravating factors: bending, stretching, reaching overhead Relieving factors: pain med sometimes but I do not like taking them  PRECAUTIONS: Fall  WEIGHT BEARING RESTRICTIONS No  FALLS:  Has patient fallen in last 6 months? Yes. Number of falls 5 or 6   LIVING ENVIRONMENT: Lives with: lives alone Lives in: House/apartment Stairs: Yes: Internal: 14 steps; on left going up Has following equipment at home: Single point cane and Walker - 2 wheeled  OCCUPATION: retired  PLOF: Independent  PATIENT GOALS get my neck and back fixed, get my balance  OBJECTIVE:   DIAGNOSTIC FINDINGS:  FINDINGS: Single cross-table lateral portable spot fluoro image obtained of the upper cervical spine. Support catheters are seen overlying the hypopharynx.   IMPRESSION: Intraoperative localization during posterior cervical fusion.  PATIENT SURVEYS:  FOTO 37   COGNITION: Overall cognitive status: Within functional limits for tasks assessed   SENSATION: WFL  POSTURE: rounded shoulders, forward head, increased thoracic kyphosis, and flexed trunk   PALPATION: TTP and tightness along c-spine and UTs   CERVICAL ROM:   Active ROM A/PROM (deg) eval  Flexion 75% w/pain  Extension Very limited due to pain  Right lateral flexion Limited due to pain  Left lateral flexion Limited due to pain  Right rotation 20d w/pain  Left rotation 20d w/pain   (Blank rows = not tested)  UPPER EXTREMITY ROM:  Active ROM Right eval Left eval  Shoulder flexion 110 110  Shoulder extension  Shoulder abduction 90 90  Shoulder adduction    Shoulder extension     Shoulder internal rotation 55 50  Shoulder external rotation C7 C7  Elbow flexion    Elbow extension    Wrist flexion    Wrist extension    Wrist ulnar deviation    Wrist radial deviation    Wrist pronation    Wrist supination     (Blank rows = not tested)  UPPER EXTREMITY MMT:  grossly 4+/5 for all shoulder MMT and LE   FUNCTIONAL TESTS:  5 times sit to stand: 15.29s Timed up and go (TUG): 13.09s Berg Balance Scale: 31/56 tested on 04/28/22  VESTIBULAR TESTING Smooth pursuits and saccades are normal, no nystagmus   TODAY'S TREATMENT:  04/28/22 Merrilee Jansky 31/56 putting her at a high risk for falls Seated rows 15# 2x10 Lats 15# 2x 6 STM to the right upper trap,rhomboid and cervical area in sitting  PATIENT EDUCATION:  Education details: POC Person educated: Patient Education method: Explanation Education comprehension: verbalized understanding   HOME EXERCISE PROGRAM: TBD, review her old HEP and revise  ASSESSMENT:  CLINICAL IMPRESSION: Berg balance performed 31/56 putting her at a very high risk for falls, she has had 5-6 falls in the past 6 months, she is moving much slower than when we have seen her in the past and appears to be unsteady, she tended to have to self adjust constantly when standing due to imbalance, she still has a lot of spasms and tightness in the upper trap and the neck   OBJECTIVE IMPAIRMENTS Abnormal gait, decreased balance, decreased coordination, decreased ROM, decreased strength, decreased safety awareness, hypomobility, impaired flexibility, improper body mechanics, and pain.   ACTIVITY LIMITATIONS carrying, lifting, bending, sitting, standing, squatting, sleeping, stairs, reach over head, hygiene/grooming, and locomotion level  PARTICIPATION LIMITATIONS: meal prep, cleaning, laundry, community activity, and yard work  PERSONAL FACTORS Age, Behavior pattern, Past/current experiences, and 1-2 comorbidities: hx of neck and back surgery, HTN,  chronic kidney disease  are also affecting patient's functional outcome.   REHAB POTENTIAL: Fair    CLINICAL DECISION MAKING: Evolving/moderate complexity  EVALUATION COMPLEXITY: Moderate   GOALS: Goals reviewed with patient? No  SHORT TERM GOALS: Target date: 05/27/22  Patient will be independent with initial HEP.  Goal status: INITIAL  2.  Patient will report pain levels at worst to be at least 5/10  Baseline: 7/10 Goal status: INITIAL    LONG TERM GOALS: Target date: 06/24/22  1.  Patient will demonstrate full pain free cervical ROM for safety with driving.  Goal status: INITIAL  2.  Patient will demonstrate improved posture to decrease muscle imbalance. Goal status: INITIAL  3.  Patient will report 73 on FOTO (patient outcome measure)  to demonstrate improved functional ability.  Baseline: 37 Goal status: INITIAL  6.  Patient will increase BERG score by 10 points to demonstrate improved balance and decreased fall risk.  Goal status: INITIAL    PLAN: PT FREQUENCY: 2x/week  PT DURATION: 8 weeks  PLANNED INTERVENTIONS: Therapeutic exercises, Therapeutic activity, Neuromuscular re-education, Balance training, Gait training, Patient/Family education, Self Care, Joint mobilization, Dry Needling, Electrical stimulation, Spinal mobilization, Cryotherapy, Moist heat, Taping, Vasopneumatic device, Traction, Ultrasound, Ionotophoresis '4mg'$ /ml Dexamethasone, Manual therapy, and Re-evaluation  PLAN FOR NEXT SESSION: rwork on strength, balance and treat spasms as needed  Amadi Yoshino W, PT 04/28/2022, 9:40 AM

## 2022-05-05 ENCOUNTER — Ambulatory Visit: Payer: Medicare Other | Admitting: Physical Therapy

## 2022-05-07 ENCOUNTER — Encounter: Payer: Self-pay | Admitting: Physical Therapy

## 2022-05-07 ENCOUNTER — Ambulatory Visit: Payer: Medicare Other | Admitting: Physical Therapy

## 2022-05-07 DIAGNOSIS — M5412 Radiculopathy, cervical region: Secondary | ICD-10-CM | POA: Diagnosis not present

## 2022-05-07 DIAGNOSIS — R278 Other lack of coordination: Secondary | ICD-10-CM

## 2022-05-07 DIAGNOSIS — R2681 Unsteadiness on feet: Secondary | ICD-10-CM

## 2022-05-07 DIAGNOSIS — M6281 Muscle weakness (generalized): Secondary | ICD-10-CM

## 2022-05-07 DIAGNOSIS — G8929 Other chronic pain: Secondary | ICD-10-CM

## 2022-05-07 DIAGNOSIS — R262 Difficulty in walking, not elsewhere classified: Secondary | ICD-10-CM

## 2022-05-07 NOTE — Therapy (Signed)
OUTPATIENT PHYSICAL THERAPY CERVICAL EVALUATION   Patient Name: Becky Hobbs MRN: [id number] DOB:Nov 05, 1945, 76 y.o., female Today's Date: 05/07/2022   PT End of Session - 05/07/22 1153     Visit Number 3    Date for PT Re-Evaluation 06/24/22    PT Start Time 1144    PT Stop Time 1225    PT Time Calculation (min) 41 min    Activity Tolerance Patient tolerated treatment well;Patient limited by pain    Behavior During Therapy WFL for tasks assessed/performed              Past Medical History:  Diagnosis Date   Anemia    Anxiety    panic attacks   Arthritis    Asthma    Blood transfusion    1973--with twins birth   Bronchitis    Chronic kidney disease    Diabetes mellitus    borderline   GERD (gastroesophageal reflux disease)    H/O hiatal hernia    surgery fixed   Headache    migraines   Heart murmur    MVP   Hepatitis    Hep C   Hypertension    Hypothyroidism    Pneumonia    Past Surgical History:  Procedure Laterality Date   ABDOMINAL HYSTERECTOMY     APPENDECTOMY     APPLICATION OF INTRAOPERATIVE CT SCAN N/A 06/05/2021   Procedure: APPLICATION OF INTRAOPERATIVE CT SCAN;  Surgeon: Karsten Ro, DO;  Location: [place];  Service: Neurosurgery;  Laterality: N/A;   BACK SURGERY     bladder tack     BREAST CYST EXCISION     BREAST CYST INCISION AND DRAINAGE     BREAST SURGERY     BUNIONECTOMY     CHOLECYSTECTOMY     DILATION AND CURETTAGE OF UTERUS     ESOPHAGOGASTRODUODENOSCOPY (EGD) WITH PROPOFOL N/A 01/03/2019   Procedure: ESOPHAGOGASTRODUODENOSCOPY (EGD) WITH PROPOFOL;  Surgeon: Otis Brace, MD;  Location: [place];  Service: Gastroenterology;  Laterality: N/A;   FOREIGN BODY REMOVAL  01/03/2019   Procedure: FOREIGN BODY REMOVAL;  Surgeon: Otis Brace, MD;  Location: [facility];  Service: Gastroenterology;;   FRACTURE SURGERY     left wrist   HERNIA REPAIR     hiatal hernia   LUMBAR FUSION  07/26/2017   LUMBAR LAMINECTOMY   03/04/2012   Procedure: MICRODISCECTOMY LUMBAR LAMINECTOMY;  Surgeon: Jessy Oto, MD;  Location: [place];  Service: Orthopedics;  Laterality: N/A;  L3-4 central laminectomy   NASAL SEPTUM SURGERY     nissan fundoplication     OVARIAN CYST SURGERY     POSTERIOR CERVICAL FUSION/FORAMINOTOMY N/A 06/05/2021   Procedure: OCCIPITAL- CERVICAL TWO INSTRUMENTATION AND FUSION; EXTENSION TO CERVICAL FIVE;  Surgeon: Karsten Ro, DO;  Location: [place];  Service: Neurosurgery;  Laterality: N/A;   THORACIC FUSION  07/26/2017   TONSILLECTOMY     Patient Active Problem List   Diagnosis Date Noted   Neurogenic bladder 04/03/2022   Spondylosis, cervical, with myelopathy 04/03/2022   Myofascial pain dysfunction syndrome 12/17/2021   Hepatic cirrhosis ([place]) 06/26/2021   Fracture of C1 vertebra, closed ([place]) 06/09/2021   Closed C1 fracture ([place]) 06/05/2021   Plantar fasciitis of left foot 08/21/2018   Acute renal failure (ARF) ([place]) 02/09/2018   Chronic migraine without aura without status migrainosus, not intractable 06/16/2017   History of lumbar fusion 08/03/2016    Class: Chronic   Migraine 04/09/2016   Acute-on-chronic kidney injury ([place]) 09/07/2015  Hypertension 09/07/2015   Hypothyroidism 09/07/2015   Gout 09/07/2015   Low back pain 09/07/2015   Obesity (BMI 30-39.9) 07/06/2013   Headache 04/12/2013   Concussion 04/12/2013   Lumbar spinal stenosis 03/04/2012    Class: Diagnosis of    PCP: Marton Redwood  REFERRING PROVIDER: Courtney Heys  REFERRING DIAG: M79.18, M54.2, Z98.890, M47.12  THERAPY DIAG:  Radiculopathy, cervical region  Difficulty in walking, not elsewhere classified  Muscle weakness (generalized)  Unsteadiness on feet  Chronic right-sided low back pain with left-sided sciatica  Other lack of coordination  Rationale for Evaluation and Treatment Rehabilitation  ONSET DATE: 04/03/22  SUBJECTIVE:                                                                                                                                                                                                          SUBJECTIVE STATEMENT: Patient reports no falls since last visit. She has lost her balance, but caught herself. She uses her cane in her R side, which she feels makes a difference. She is performing home videos for strengthening.  PERTINENT HISTORY:  Lumbar fusions and laminectomy, cervical fusion 2022  PAIN:  Are you having pain? Yes: NPRS scale: 7/10 Pain location: neck Pain description: achy, it just hurts Aggravating factors: bending, stretching, reaching overhead Relieving factors: pain med sometimes but I do not like taking them  PRECAUTIONS: Fall  WEIGHT BEARING RESTRICTIONS No  FALLS:  Has patient fallen in last 6 months? Yes. Number of falls 5 or 6   LIVING ENVIRONMENT: Lives with: lives alone Lives in: House/apartment Stairs: Yes: Internal: 14 steps; on left going up Has following equipment at home: Single point cane and Walker - 2 wheeled  OCCUPATION: retired  PLOF: Independent  PATIENT GOALS get my neck and back fixed, get my balance  OBJECTIVE:   DIAGNOSTIC FINDINGS:  FINDINGS: Single cross-table lateral portable spot fluoro image obtained of the upper cervical spine. Support catheters are seen overlying the hypopharynx.   IMPRESSION: Intraoperative localization during posterior cervical fusion.  PATIENT SURVEYS:  FOTO 37   COGNITION: Overall cognitive status: Within functional limits for tasks assessed   SENSATION: WFL  POSTURE: rounded shoulders, forward head, increased thoracic kyphosis, and flexed trunk   PALPATION: TTP and tightness along c-spine and UTs   CERVICAL ROM:   Active ROM A/PROM (deg) eval  Flexion 75% w/pain  Extension Very limited due to pain  Right lateral flexion Limited due to pain  Left lateral flexion Limited due to pain  Right rotation 20d w/pain  Left rotation  20d w/pain   (Blank  rows = not tested)  UPPER EXTREMITY ROM:  Active ROM Right eval Left eval  Shoulder flexion 110 110  Shoulder extension    Shoulder abduction 90 90  Shoulder adduction    Shoulder extension    Shoulder internal rotation 55 50  Shoulder external rotation C7 C7  Elbow flexion    Elbow extension    Wrist flexion    Wrist extension    Wrist ulnar deviation    Wrist radial deviation    Wrist pronation    Wrist supination     (Blank rows = not tested)  UPPER EXTREMITY MMT:  grossly 4+/5 for all shoulder MMT and LE   FUNCTIONAL TESTS:  5 times sit to stand: 15.29s Timed up and go (TUG): 13.09s Berg Balance Scale: 31/56 tested on 04/28/22  VESTIBULAR TESTING Smooth pursuits and saccades are normal, no nystagmus   TODAY'S TREATMENT:  05/07/22 NuStep L3 x 6 minutes Sit to stand while holding 4# ball in BUE, with chest press at the top. 2 x 10 B side stepping 2 x  4 sets in each direction with red T band resistance at knees. B knee flex 20#, 2 x 12 reps B knee ext 5#, 2 x 12 reps STM to R Up traps LS followed by stretch  04/28/22 Merrilee Jansky 31/56 putting her at a high risk for falls Seated rows 15# 2x10 Lats 15# 2x 6 STM to the right upper trap,rhomboid and cervical area in sitting  PATIENT EDUCATION:  Education details: Reviewed previous HEP Person educated: Patient Education method: Explanation Education comprehension: verbalized understanding   HOME EXERCISE PROGRAM: [id number]  ASSESSMENT:  CLINICAL IMPRESSION: Patient reports no more falls since her last visit. She is performing some exercises using videos, uses cane or RW for balance during gait. Treatment emphasized BLE strengthening in all planes, including standing activities to also challenge her balance. Re issued HEP from previous treatments.   OBJECTIVE IMPAIRMENTS Abnormal gait, decreased balance, decreased coordination, decreased ROM, decreased strength, decreased safety awareness, hypomobility, impaired  flexibility, improper body mechanics, and pain.   ACTIVITY LIMITATIONS carrying, lifting, bending, sitting, standing, squatting, sleeping, stairs, reach over head, hygiene/grooming, and locomotion level  PARTICIPATION LIMITATIONS: meal prep, cleaning, laundry, community activity, and yard work  PERSONAL FACTORS Age, Behavior pattern, Past/current experiences, and 1-2 comorbidities: hx of neck and back surgery, HTN, chronic kidney disease  are also affecting patient's functional outcome.   REHAB POTENTIAL: Fair    CLINICAL DECISION MAKING: Evolving/moderate complexity  EVALUATION COMPLEXITY: Moderate   GOALS: Goals reviewed with patient? No  SHORT TERM GOALS: Target date: 05/27/22  Patient will be independent with initial HEP.  Goal status: ongoing  2.  Patient will report pain levels at worst to be at least 5/10  Baseline: 7/10 Goal status: INITIAL    LONG TERM GOALS: Target date: 06/24/22  1.  Patient will demonstrate full pain free cervical ROM for safety with driving.  Goal status: INITIAL  2.  Patient will demonstrate improved posture to decrease muscle imbalance. Goal status: INITIAL  3.  Patient will report 47 on FOTO (patient outcome measure)  to demonstrate improved functional ability.  Baseline: 37 Goal status: INITIAL  6.  Patient will increase BERG score by 10 points to demonstrate improved balance and decreased fall risk.  Goal status: INITIAL    PLAN: PT FREQUENCY: 2x/week  PT DURATION: 8 weeks  PLANNED INTERVENTIONS: Therapeutic exercises, Therapeutic activity, Neuromuscular re-education, Balance training, Gait training, Patient/Family education,  Self Care, Joint mobilization, Dry Needling, Electrical stimulation, Spinal mobilization, Cryotherapy, Moist heat, Taping, Vasopneumatic device, Traction, Ultrasound, Ionotophoresis '4mg'$ /ml Dexamethasone, Manual therapy, and Re-evaluation  PLAN FOR NEXT SESSION: rwork on strength, balance and treat spasms as  needed  Marcelina Morel, DPT 05/07/2022, 12:51 PM

## 2022-05-12 ENCOUNTER — Ambulatory Visit: Payer: Medicare Other | Attending: Physical Medicine and Rehabilitation | Admitting: Physical Therapy

## 2022-05-12 ENCOUNTER — Encounter: Payer: Self-pay | Admitting: Physical Therapy

## 2022-05-12 DIAGNOSIS — S12000A Unspecified displaced fracture of first cervical vertebra, initial encounter for closed fracture: Secondary | ICD-10-CM | POA: Insufficient documentation

## 2022-05-12 DIAGNOSIS — G8929 Other chronic pain: Secondary | ICD-10-CM | POA: Insufficient documentation

## 2022-05-12 DIAGNOSIS — M5412 Radiculopathy, cervical region: Secondary | ICD-10-CM | POA: Diagnosis present

## 2022-05-12 DIAGNOSIS — R262 Difficulty in walking, not elsewhere classified: Secondary | ICD-10-CM | POA: Insufficient documentation

## 2022-05-12 DIAGNOSIS — M6281 Muscle weakness (generalized): Secondary | ICD-10-CM | POA: Insufficient documentation

## 2022-05-12 DIAGNOSIS — R2681 Unsteadiness on feet: Secondary | ICD-10-CM | POA: Diagnosis present

## 2022-05-12 DIAGNOSIS — R278 Other lack of coordination: Secondary | ICD-10-CM | POA: Insufficient documentation

## 2022-05-12 DIAGNOSIS — M5442 Lumbago with sciatica, left side: Secondary | ICD-10-CM | POA: Diagnosis present

## 2022-05-12 NOTE — Therapy (Signed)
OUTPATIENT PHYSICAL THERAPY CERVICAL EVALUATION   Patient Name: Becky Hobbs MRN: [id number] DOB:1946/02/09, 76 y.o., female Today's Date: 05/12/2022   PT End of Session - 05/12/22 1320     Visit Number 4    Date for PT Re-Evaluation 06/24/22    PT Start Time 1315    PT Stop Time 1400    PT Time Calculation (min) 45 min    Activity Tolerance Patient tolerated treatment well;Patient limited by pain    Behavior During Therapy WFL for tasks assessed/performed              Past Medical History:  Diagnosis Date   Anemia    Anxiety    panic attacks   Arthritis    Asthma    Blood transfusion    1973--with twins birth   Bronchitis    Chronic kidney disease    Diabetes mellitus    borderline   GERD (gastroesophageal reflux disease)    H/O hiatal hernia    surgery fixed   Headache    migraines   Heart murmur    MVP   Hepatitis    Hep C   Hypertension    Hypothyroidism    Pneumonia    Past Surgical History:  Procedure Laterality Date   ABDOMINAL HYSTERECTOMY     APPENDECTOMY     APPLICATION OF INTRAOPERATIVE CT SCAN N/A 06/05/2021   Procedure: APPLICATION OF INTRAOPERATIVE CT SCAN;  Surgeon: Karsten Ro, DO;  Location: [place];  Service: Neurosurgery;  Laterality: N/A;   BACK SURGERY     bladder tack     BREAST CYST EXCISION     BREAST CYST INCISION AND DRAINAGE     BREAST SURGERY     BUNIONECTOMY     CHOLECYSTECTOMY     DILATION AND CURETTAGE OF UTERUS     ESOPHAGOGASTRODUODENOSCOPY (EGD) WITH PROPOFOL N/A 01/03/2019   Procedure: ESOPHAGOGASTRODUODENOSCOPY (EGD) WITH PROPOFOL;  Surgeon: Otis Brace, MD;  Location: [place];  Service: Gastroenterology;  Laterality: N/A;   FOREIGN BODY REMOVAL  01/03/2019   Procedure: FOREIGN BODY REMOVAL;  Surgeon: Otis Brace, MD;  Location: [facility];  Service: Gastroenterology;;   FRACTURE SURGERY     left wrist   HERNIA REPAIR     hiatal hernia   LUMBAR FUSION  07/26/2017   LUMBAR LAMINECTOMY   03/04/2012   Procedure: MICRODISCECTOMY LUMBAR LAMINECTOMY;  Surgeon: Jessy Oto, MD;  Location: [place];  Service: Orthopedics;  Laterality: N/A;  L3-4 central laminectomy   NASAL SEPTUM SURGERY     nissan fundoplication     OVARIAN CYST SURGERY     POSTERIOR CERVICAL FUSION/FORAMINOTOMY N/A 06/05/2021   Procedure: OCCIPITAL- CERVICAL TWO INSTRUMENTATION AND FUSION; EXTENSION TO CERVICAL FIVE;  Surgeon: Karsten Ro, DO;  Location: [place];  Service: Neurosurgery;  Laterality: N/A;   THORACIC FUSION  07/26/2017   TONSILLECTOMY     Patient Active Problem List   Diagnosis Date Noted   Neurogenic bladder 04/03/2022   Spondylosis, cervical, with myelopathy 04/03/2022   Myofascial pain dysfunction syndrome 12/17/2021   Hepatic cirrhosis ([place]) 06/26/2021   Fracture of C1 vertebra, closed ([place]) 06/09/2021   Closed C1 fracture ([place]) 06/05/2021   Plantar fasciitis of left foot 08/21/2018   Acute renal failure (ARF) ([place]) 02/09/2018   Chronic migraine without aura without status migrainosus, not intractable 06/16/2017   History of lumbar fusion 08/03/2016    Class: Chronic   Migraine 04/09/2016   Acute-on-chronic kidney injury ([place]) 09/07/2015  Hypertension 09/07/2015   Hypothyroidism 09/07/2015   Gout 09/07/2015   Low back pain 09/07/2015   Obesity (BMI 30-39.9) 07/06/2013   Headache 04/12/2013   Concussion 04/12/2013   Lumbar spinal stenosis 03/04/2012    Class: Diagnosis of    PCP: Marton Redwood  REFERRING PROVIDER: Courtney Heys  REFERRING DIAG: M79.18, M54.2, Z98.890, M47.12  THERAPY DIAG:  Radiculopathy, cervical region  Difficulty in walking, not elsewhere classified  Muscle weakness (generalized)  Unsteadiness on feet  Chronic right-sided low back pain with left-sided sciatica  Rationale for Evaluation and Treatment Rehabilitation  ONSET DATE: 04/03/22  SUBJECTIVE:                                                                                                                                                                                                          SUBJECTIVE STATEMENT: My legs are the weakest, I have a fear of falling, still hurting in the neck and shoulders  PERTINENT HISTORY:  Lumbar fusions and laminectomy, cervical fusion 2022  PAIN:  Are you having pain? Yes: NPRS scale: 7/10 Pain location: neck Pain description: achy, it just hurts Aggravating factors: bending, stretching, reaching overhead Relieving factors: pain med sometimes but I do not like taking them  PRECAUTIONS: Fall  WEIGHT BEARING RESTRICTIONS No  FALLS:  Has patient fallen in last 6 months? Yes. Number of falls 5 or 6   LIVING ENVIRONMENT: Lives with: lives alone Lives in: House/apartment Stairs: Yes: Internal: 14 steps; on left going up Has following equipment at home: Single point cane and Walker - 2 wheeled  OCCUPATION: retired  PLOF: Independent  PATIENT GOALS get my neck and back fixed, get my balance  OBJECTIVE:   DIAGNOSTIC FINDINGS:  FINDINGS: Single cross-table lateral portable spot fluoro image obtained of the upper cervical spine. Support catheters are seen overlying the hypopharynx.   IMPRESSION: Intraoperative localization during posterior cervical fusion.  PATIENT SURVEYS:  FOTO 37   COGNITION: Overall cognitive status: Within functional limits for tasks assessed   SENSATION: WFL  POSTURE: rounded shoulders, forward head, increased thoracic kyphosis, and flexed trunk   PALPATION: TTP and tightness along c-spine and UTs   CERVICAL ROM:   Active ROM A/PROM (deg) eval  Flexion 75% w/pain  Extension Very limited due to pain  Right lateral flexion Limited due to pain  Left lateral flexion Limited due to pain  Right rotation 20d w/pain  Left rotation 20d w/pain   (Blank rows = not tested)  UPPER EXTREMITY ROM:  Active ROM Right eval Left eval  Shoulder flexion  110 110  Shoulder extension    Shoulder  abduction 90 90  Shoulder adduction    Shoulder extension    Shoulder internal rotation 55 50  Shoulder external rotation C7 C7  Elbow flexion    Elbow extension    Wrist flexion    Wrist extension    Wrist ulnar deviation    Wrist radial deviation    Wrist pronation    Wrist supination     (Blank rows = not tested)  UPPER EXTREMITY MMT:  grossly 4+/5 for all shoulder MMT and LE   FUNCTIONAL TESTS:  5 times sit to stand: 15.29s Timed up and go (TUG): 13.09s Berg Balance Scale: 31/56 tested on 04/28/22  VESTIBULAR TESTING Smooth pursuits and saccades are normal, no nystagmus   TODAY'S TREATMENT:  05/12/22 UBE level 2 x 5 minutes Leg press 20# 2x10 Seated row 10# 2x10 Cone toe touches Bend over and touch cone with hand On airex standing,  Ball toss, side stepping, backward walking, direction changes STM to the upper traps in sitting  05/07/22 NuStep L3 x 6 minutes Sit to stand while holding 4# ball in BUE, with chest press at the top. 2 x 10 B side stepping 2 x  4 sets in each direction with red T band resistance at knees. B knee flex 20#, 2 x 12 reps B knee ext 5#, 2 x 12 reps STM to R Up traps LS followed by stretch  04/28/22 Merrilee Jansky 31/56 putting her at a high risk for falls Seated rows 15# 2x10 Lats 15# 2x 6 STM to the right upper trap,rhomboid and cervical area in sitting  PATIENT EDUCATION:  Education details: Reviewed previous HEP Person educated: Patient Education method: Explanation Education comprehension: verbalized understanding   HOME EXERCISE PROGRAM: [id number]  ASSESSMENT:  CLINICAL IMPRESSION: Starting working on balance today, tends to not be stable on the uneven surfaces and when bending forward, had a few times that she needed mod A to stop a LOB   OBJECTIVE IMPAIRMENTS Abnormal gait, decreased balance, decreased coordination, decreased ROM, decreased strength, decreased safety awareness, hypomobility, impaired flexibility, improper body  mechanics, and pain.   ACTIVITY LIMITATIONS carrying, lifting, bending, sitting, standing, squatting, sleeping, stairs, reach over head, hygiene/grooming, and locomotion level  PARTICIPATION LIMITATIONS: meal prep, cleaning, laundry, community activity, and yard work  PERSONAL FACTORS Age, Behavior pattern, Past/current experiences, and 1-2 comorbidities: hx of neck and back surgery, HTN, chronic kidney disease  are also affecting patient's functional outcome.   REHAB POTENTIAL: Fair    CLINICAL DECISION MAKING: Evolving/moderate complexity  EVALUATION COMPLEXITY: Moderate   GOALS: Goals reviewed with patient? No  SHORT TERM GOALS: Target date: 05/27/22  Patient will be independent with initial HEP.  Goal status: ongoing  2.  Patient will report pain levels at worst to be at least 5/10  Baseline: 7/10 Goal status: INITIAL    LONG TERM GOALS: Target date: 06/24/22  1.  Patient will demonstrate full pain free cervical ROM for safety with driving.  Goal status: INITIAL  2.  Patient will demonstrate improved posture to decrease muscle imbalance. Goal status: INITIAL  3.  Patient will report 67 on FOTO (patient outcome measure)  to demonstrate improved functional ability.  Baseline: 37 Goal status: INITIAL  6.  Patient will increase BERG score by 10 points to demonstrate improved balance and decreased fall risk.  Goal status: INITIAL    PLAN: PT FREQUENCY: 2x/week  PT DURATION: 8 weeks  PLANNED INTERVENTIONS: Therapeutic exercises, Therapeutic activity,  Neuromuscular re-education, Balance training, Gait training, Patient/Family education, Self Care, Joint mobilization, Dry Needling, Electrical stimulation, Spinal mobilization, Cryotherapy, Moist heat, Taping, Vasopneumatic device, Traction, Ultrasound, Ionotophoresis '4mg'$ /ml Dexamethasone, Manual therapy, and Re-evaluation  PLAN FOR NEXT SESSION: work on strength, balance and treat spasms as needed  Marcelina Morel,  DPT 05/12/2022, 1:21 PM

## 2022-05-14 ENCOUNTER — Ambulatory Visit: Payer: Medicare Other | Admitting: Physical Therapy

## 2022-05-14 ENCOUNTER — Encounter: Payer: Self-pay | Admitting: Physical Therapy

## 2022-05-14 DIAGNOSIS — M6281 Muscle weakness (generalized): Secondary | ICD-10-CM

## 2022-05-14 DIAGNOSIS — M5412 Radiculopathy, cervical region: Secondary | ICD-10-CM

## 2022-05-14 DIAGNOSIS — R262 Difficulty in walking, not elsewhere classified: Secondary | ICD-10-CM

## 2022-05-14 DIAGNOSIS — R278 Other lack of coordination: Secondary | ICD-10-CM

## 2022-05-14 DIAGNOSIS — R2681 Unsteadiness on feet: Secondary | ICD-10-CM

## 2022-05-14 NOTE — Therapy (Signed)
OUTPATIENT PHYSICAL THERAPY CERVICAL EVALUATION   Patient Name: Becky Hobbs MRN: [id number] DOB:10/22/1945, 76 y.o., female Today's Date: 05/14/2022   PT End of Session - 05/14/22 1322     Visit Number 5    Date for PT Re-Evaluation 06/24/22    PT Start Time 1319    PT Stop Time 48    PT Time Calculation (min) 39 min    Activity Tolerance Patient tolerated treatment well;Patient limited by pain    Behavior During Therapy WFL for tasks assessed/performed               Past Medical History:  Diagnosis Date   Anemia    Anxiety    panic attacks   Arthritis    Asthma    Blood transfusion    1973--with twins birth   Bronchitis    Chronic kidney disease    Diabetes mellitus    borderline   GERD (gastroesophageal reflux disease)    H/O hiatal hernia    surgery fixed   Headache    migraines   Heart murmur    MVP   Hepatitis    Hep C   Hypertension    Hypothyroidism    Pneumonia    Past Surgical History:  Procedure Laterality Date   ABDOMINAL HYSTERECTOMY     APPENDECTOMY     APPLICATION OF INTRAOPERATIVE CT SCAN N/A 06/05/2021   Procedure: APPLICATION OF INTRAOPERATIVE CT SCAN;  Surgeon: Karsten Ro, DO;  Location: [place];  Service: Neurosurgery;  Laterality: N/A;   BACK SURGERY     bladder tack     BREAST CYST EXCISION     BREAST CYST INCISION AND DRAINAGE     BREAST SURGERY     BUNIONECTOMY     CHOLECYSTECTOMY     DILATION AND CURETTAGE OF UTERUS     ESOPHAGOGASTRODUODENOSCOPY (EGD) WITH PROPOFOL N/A 01/03/2019   Procedure: ESOPHAGOGASTRODUODENOSCOPY (EGD) WITH PROPOFOL;  Surgeon: Otis Brace, MD;  Location: [place];  Service: Gastroenterology;  Laterality: N/A;   FOREIGN BODY REMOVAL  01/03/2019   Procedure: FOREIGN BODY REMOVAL;  Surgeon: Otis Brace, MD;  Location: [facility];  Service: Gastroenterology;;   FRACTURE SURGERY     left wrist   HERNIA REPAIR     hiatal hernia   LUMBAR FUSION  07/26/2017   LUMBAR LAMINECTOMY   03/04/2012   Procedure: MICRODISCECTOMY LUMBAR LAMINECTOMY;  Surgeon: Jessy Oto, MD;  Location: [place];  Service: Orthopedics;  Laterality: N/A;  L3-4 central laminectomy   NASAL SEPTUM SURGERY     nissan fundoplication     OVARIAN CYST SURGERY     POSTERIOR CERVICAL FUSION/FORAMINOTOMY N/A 06/05/2021   Procedure: OCCIPITAL- CERVICAL TWO INSTRUMENTATION AND FUSION; EXTENSION TO CERVICAL FIVE;  Surgeon: Karsten Ro, DO;  Location: [place];  Service: Neurosurgery;  Laterality: N/A;   THORACIC FUSION  07/26/2017   TONSILLECTOMY     Patient Active Problem List   Diagnosis Date Noted   Neurogenic bladder 04/03/2022   Spondylosis, cervical, with myelopathy 04/03/2022   Myofascial pain dysfunction syndrome 12/17/2021   Hepatic cirrhosis ([place]) 06/26/2021   Fracture of C1 vertebra, closed ([place]) 06/09/2021   Closed C1 fracture ([place]) 06/05/2021   Plantar fasciitis of left foot 08/21/2018   Acute renal failure (ARF) ([place]) 02/09/2018   Chronic migraine without aura without status migrainosus, not intractable 06/16/2017   History of lumbar fusion 08/03/2016    Class: Chronic   Migraine 04/09/2016   Acute-on-chronic kidney injury ([place]) 09/07/2015  Hypertension 09/07/2015   Hypothyroidism 09/07/2015   Gout 09/07/2015   Low back pain 09/07/2015   Obesity (BMI 30-39.9) 07/06/2013   Headache 04/12/2013   Concussion 04/12/2013   Lumbar spinal stenosis 03/04/2012    Class: Diagnosis of    PCP: Marton Redwood  REFERRING PROVIDER: Courtney Heys  REFERRING DIAG: M79.18, M54.2, Z98.890, M47.12  THERAPY DIAG:  Radiculopathy, cervical region  Difficulty in walking, not elsewhere classified  Muscle weakness (generalized)  Other lack of coordination  Unsteadiness on feet  Rationale for Evaluation and Treatment Rehabilitation  ONSET DATE: 04/03/22  SUBJECTIVE:                                                                                                                                                                                                          SUBJECTIVE STATEMENT: Patient reports increased stiffness due to weather today.   PERTINENT HISTORY:  Lumbar fusions and laminectomy, cervical fusion 2022  PAIN:  Are you having pain? Yes: NPRS scale: 7/10 Pain location: neck Pain description: achy, it just hurts Aggravating factors: bending, stretching, reaching overhead Relieving factors: pain med sometimes but I do not like taking them  PRECAUTIONS: Fall  WEIGHT BEARING RESTRICTIONS No  FALLS:  Has patient fallen in last 6 months? Yes. Number of falls 5 or 6   LIVING ENVIRONMENT: Lives with: lives alone Lives in: House/apartment Stairs: Yes: Internal: 14 steps; on left going up Has following equipment at home: Single point cane and Walker - 2 wheeled  OCCUPATION: retired  PLOF: Independent  PATIENT GOALS get my neck and back fixed, get my balance  OBJECTIVE:   DIAGNOSTIC FINDINGS:  FINDINGS: Single cross-table lateral portable spot fluoro image obtained of the upper cervical spine. Support catheters are seen overlying the hypopharynx.   IMPRESSION: Intraoperative localization during posterior cervical fusion.  PATIENT SURVEYS:  FOTO 37   COGNITION: Overall cognitive status: Within functional limits for tasks assessed   SENSATION: WFL  POSTURE: rounded shoulders, forward head, increased thoracic kyphosis, and flexed trunk   PALPATION: TTP and tightness along c-spine and UTs   CERVICAL ROM:   Active ROM A/PROM (deg) eval  Flexion 75% w/pain  Extension Very limited due to pain  Right lateral flexion Limited due to pain  Left lateral flexion Limited due to pain  Right rotation 20d w/pain  Left rotation 20d w/pain   (Blank rows = not tested)  UPPER EXTREMITY ROM:  Active ROM Right eval Left eval  Shoulder flexion 110 110  Shoulder extension    Shoulder abduction 90 90  Shoulder adduction    Shoulder extension     Shoulder internal rotation 55 50  Shoulder external rotation C7 C7  Elbow flexion    Elbow extension    Wrist flexion    Wrist extension    Wrist ulnar deviation    Wrist radial deviation    Wrist pronation    Wrist supination     (Blank rows = not tested)  UPPER EXTREMITY MMT:  grossly 4+/5 for all shoulder MMT and LE   FUNCTIONAL TESTS:  5 times sit to stand: 15.29s Timed up and go (TUG): 13.09s Berg Balance Scale: 31/56 tested on 04/28/22  VESTIBULAR TESTING Smooth pursuits and saccades are normal, no nystagmus   TODAY'S TREATMENT:  05/14/22 Recumbent bike L3 x 6 minutes Sit to stand with 2# ball in BUE, chest press at the top, 10 reps. Standing Hip strength facing wall with 2# weight, 5 reps each in abd, diagonally back, hip ext. B Heel raises on bar 10 reps Alternating taps on cones, increased in challenge with rotation and tapping multiple cones, occ min a Crossover step with weight shift, then return to start, min a for balance.   05/12/22 UBE level 2 x 5 minutes Leg press 20# 2x10 Seated row 10# 2x10 Cone toe touches Bend over and touch cone with hand On airex standing,  Ball toss, side stepping, backward walking, direction changes STM to the upper traps in sitting  05/07/22 NuStep L3 x 6 minutes Sit to stand while holding 4# ball in BUE, with chest press at the top. 2 x 10 B side stepping 2 x  4 sets in each direction with red T band resistance at knees. B knee flex 20#, 2 x 12 reps B knee ext 5#, 2 x 12 reps STM to R Up traps LS followed by stretch  04/28/22 Merrilee Jansky 31/56 putting her at a high risk for falls Seated rows 15# 2x10 Lats 15# 2x 6 STM to the right upper trap,rhomboid and cervical area in sitting  PATIENT EDUCATION:  Education details: Reviewed previous HEP Person educated: Patient Education method: Explanation Education comprehension: verbalized understanding   HOME EXERCISE PROGRAM: [id number]  ASSESSMENT:  CLINICAL  IMPRESSION: Treatment initiated with strengthening for BLE, moved to balance, especially SLS. She improves with VC to stand tall on her supporting limb-improved balance.   OBJECTIVE IMPAIRMENTS Abnormal gait, decreased balance, decreased coordination, decreased ROM, decreased strength, decreased safety awareness, hypomobility, impaired flexibility, improper body mechanics, and pain.   ACTIVITY LIMITATIONS carrying, lifting, bending, sitting, standing, squatting, sleeping, stairs, reach over head, hygiene/grooming, and locomotion level  PARTICIPATION LIMITATIONS: meal prep, cleaning, laundry, community activity, and yard work  PERSONAL FACTORS Age, Behavior pattern, Past/current experiences, and 1-2 comorbidities: hx of neck and back surgery, HTN, chronic kidney disease  are also affecting patient's functional outcome.   REHAB POTENTIAL: Fair    CLINICAL DECISION MAKING: Evolving/moderate complexity  EVALUATION COMPLEXITY: Moderate   GOALS: Goals reviewed with patient? No  SHORT TERM GOALS: Target date: 05/27/22  Patient will be independent with initial HEP.  Goal status: ongoing  2.  Patient will report pain levels at worst to be at least 5/10  Baseline: 7/10 Goal status: INITIAL    LONG TERM GOALS: Target date: 06/24/22  1.  Patient will demonstrate full pain free cervical ROM for safety with driving.  Goal status: INITIAL  2.  Patient will demonstrate improved posture to decrease muscle imbalance. Goal status: INITIAL  3.  Patient will report 23 on FOTO (patient  outcome measure)  to demonstrate improved functional ability.  Baseline: 37 Goal status: INITIAL  6.  Patient will increase BERG score by 10 points to demonstrate improved balance and decreased fall risk.  Goal status: INITIAL    PLAN: PT FREQUENCY: 2x/week  PT DURATION: 8 weeks  PLANNED INTERVENTIONS: Therapeutic exercises, Therapeutic activity, Neuromuscular re-education, Balance training, Gait  training, Patient/Family education, Self Care, Joint mobilization, Dry Needling, Electrical stimulation, Spinal mobilization, Cryotherapy, Moist heat, Taping, Vasopneumatic device, Traction, Ultrasound, Ionotophoresis '4mg'$ /ml Dexamethasone, Manual therapy, and Re-evaluation  PLAN FOR NEXT SESSION: work on strength, balance and treat spasms as needed  Marcelina Morel, DPT 05/14/2022, 1:57 PM

## 2022-05-19 ENCOUNTER — Ambulatory Visit: Payer: Medicare Other | Admitting: Physical Therapy

## 2022-05-19 ENCOUNTER — Encounter: Payer: Self-pay | Admitting: Physical Therapy

## 2022-05-19 DIAGNOSIS — R262 Difficulty in walking, not elsewhere classified: Secondary | ICD-10-CM

## 2022-05-19 DIAGNOSIS — R2681 Unsteadiness on feet: Secondary | ICD-10-CM

## 2022-05-19 DIAGNOSIS — M5412 Radiculopathy, cervical region: Secondary | ICD-10-CM

## 2022-05-19 DIAGNOSIS — R278 Other lack of coordination: Secondary | ICD-10-CM

## 2022-05-19 DIAGNOSIS — M6281 Muscle weakness (generalized): Secondary | ICD-10-CM

## 2022-05-19 NOTE — Therapy (Signed)
OUTPATIENT PHYSICAL THERAPY CERVICAL TREATMENT   Patient Name: Becky Hobbs MRN: [id number] DOB:1945/12/25, 76 y.o., female Today's Date: 05/19/2022   PT End of Session - 05/19/22 1321     Visit Number 6    Date for PT Re-Evaluation 06/24/22    PT Start Time 1318    PT Stop Time 1400    PT Time Calculation (min) 42 min    Activity Tolerance Patient tolerated treatment well;Patient limited by pain    Behavior During Therapy WFL for tasks assessed/performed               Past Medical History:  Diagnosis Date   Anemia    Anxiety    panic attacks   Arthritis    Asthma    Blood transfusion    1973--with twins birth   Bronchitis    Chronic kidney disease    Diabetes mellitus    borderline   GERD (gastroesophageal reflux disease)    H/O hiatal hernia    surgery fixed   Headache    migraines   Heart murmur    MVP   Hepatitis    Hep C   Hypertension    Hypothyroidism    Pneumonia    Past Surgical History:  Procedure Laterality Date   ABDOMINAL HYSTERECTOMY     APPENDECTOMY     APPLICATION OF INTRAOPERATIVE CT SCAN N/A 06/05/2021   Procedure: APPLICATION OF INTRAOPERATIVE CT SCAN;  Surgeon: Karsten Ro, DO;  Location: [place];  Service: Neurosurgery;  Laterality: N/A;   BACK SURGERY     bladder tack     BREAST CYST EXCISION     BREAST CYST INCISION AND DRAINAGE     BREAST SURGERY     BUNIONECTOMY     CHOLECYSTECTOMY     DILATION AND CURETTAGE OF UTERUS     ESOPHAGOGASTRODUODENOSCOPY (EGD) WITH PROPOFOL N/A 01/03/2019   Procedure: ESOPHAGOGASTRODUODENOSCOPY (EGD) WITH PROPOFOL;  Surgeon: Otis Brace, MD;  Location: [place];  Service: Gastroenterology;  Laterality: N/A;   FOREIGN BODY REMOVAL  01/03/2019   Procedure: FOREIGN BODY REMOVAL;  Surgeon: Otis Brace, MD;  Location: [facility] [facility];  Service: Gastroenterology;;   FRACTURE SURGERY     left wrist   HERNIA REPAIR     hiatal hernia   LUMBAR FUSION  07/26/2017   LUMBAR LAMINECTOMY   03/04/2012   Procedure: MICRODISCECTOMY LUMBAR LAMINECTOMY;  Surgeon: Jessy Oto, MD;  Location: [place];  Service: Orthopedics;  Laterality: N/A;  L3-4 central laminectomy   NASAL SEPTUM SURGERY     nissan fundoplication     OVARIAN CYST SURGERY     POSTERIOR CERVICAL FUSION/FORAMINOTOMY N/A 06/05/2021   Procedure: OCCIPITAL- CERVICAL TWO INSTRUMENTATION AND FUSION; EXTENSION TO CERVICAL FIVE;  Surgeon: Karsten Ro, DO;  Location: [place];  Service: Neurosurgery;  Laterality: N/A;   THORACIC FUSION  07/26/2017   TONSILLECTOMY     Patient Active Problem List   Diagnosis Date Noted   Neurogenic bladder 04/03/2022   Spondylosis, cervical, with myelopathy 04/03/2022   Myofascial pain dysfunction syndrome 12/17/2021   Hepatic cirrhosis ([place]) 06/26/2021   Fracture of C1 vertebra, closed ([place]) 06/09/2021   Closed C1 fracture ([place]) 06/05/2021   Plantar fasciitis of left foot 08/21/2018   Acute renal failure (ARF) ([place]) 02/09/2018   Chronic migraine without aura without status migrainosus, not intractable 06/16/2017   History of lumbar fusion 08/03/2016    Class: Chronic   Migraine 04/09/2016   Acute-on-chronic kidney injury ([place]) 09/07/2015  Hypertension 09/07/2015   Hypothyroidism 09/07/2015   Gout 09/07/2015   Low back pain 09/07/2015   Obesity (BMI 30-39.9) 07/06/2013   Headache 04/12/2013   Concussion 04/12/2013   Lumbar spinal stenosis 03/04/2012    Class: Diagnosis of    PCP: Marton Redwood  REFERRING PROVIDER: Courtney Heys  REFERRING DIAG: M79.18, M54.2, Z98.890, M47.12  THERAPY DIAG:  Radiculopathy, cervical region  Difficulty in walking, not elsewhere classified  Muscle weakness (generalized)  Other lack of coordination  Unsteadiness on feet  Rationale for Evaluation and Treatment Rehabilitation  ONSET DATE: 04/03/22  SUBJECTIVE:                                                                                                                                                                                                          SUBJECTIVE STATEMENT: Reports increased pain I the neck and upper back, again reports weather related  PERTINENT HISTORY:  Lumbar fusions and laminectomy, cervical fusion 2022  PAIN:  Are you having pain? Yes: NPRS scale: 5/10 Pain location: neck Pain description: achy, it just hurts Aggravating factors: bending, stretching, reaching overhead Relieving factors: pain med sometimes but I do not like taking them  PRECAUTIONS: Fall  WEIGHT BEARING RESTRICTIONS No  FALLS:  Has patient fallen in last 6 months? Yes. Number of falls 5 or 6   LIVING ENVIRONMENT: Lives with: lives alone Lives in: House/apartment Stairs: Yes: Internal: 14 steps; on left going up Has following equipment at home: Single point cane and Walker - 2 wheeled  OCCUPATION: retired  PLOF: Independent  PATIENT GOALS get my neck and back fixed, get my balance  OBJECTIVE:   DIAGNOSTIC FINDINGS:  FINDINGS: Single cross-table lateral portable spot fluoro image obtained of the upper cervical spine. Support catheters are seen overlying the hypopharynx.   IMPRESSION: Intraoperative localization during posterior cervical fusion.  PATIENT SURVEYS:  FOTO 37   COGNITION: Overall cognitive status: Within functional limits for tasks assessed   SENSATION: WFL  POSTURE: rounded shoulders, forward head, increased thoracic kyphosis, and flexed trunk   PALPATION: TTP and tightness along c-spine and UTs   CERVICAL ROM:   Active ROM A/PROM (deg) eval  Flexion 75% w/pain  Extension Very limited due to pain  Right lateral flexion Limited due to pain  Left lateral flexion Limited due to pain  Right rotation 20d w/pain  Left rotation 20d w/pain   (Blank rows = not tested)  UPPER EXTREMITY ROM:  Active ROM Right eval Left eval  Shoulder flexion 110 110  Shoulder extension    Shoulder  abduction 90 90  Shoulder adduction    Shoulder  extension    Shoulder internal rotation 55 50  Shoulder external rotation C7 C7  Elbow flexion    Elbow extension    Wrist flexion    Wrist extension    Wrist ulnar deviation    Wrist radial deviation    Wrist pronation    Wrist supination     (Blank rows = not tested)  UPPER EXTREMITY MMT:  grossly 4+/5 for all shoulder MMT and LE   FUNCTIONAL TESTS:  5 times sit to stand: 15.29s Timed up and go (TUG): 13.09s Berg Balance Scale: 31/56 tested on 04/28/22  VESTIBULAR TESTING Smooth pursuits and saccades are normal, no nystagmus   TODAY'S TREATMENT:  05/19/22 Bike Level 3 x 6 minutes Seated row 10# 2x10 Lats 15# 2x10 Leg press 20# 2x10 Feet on ball K2C, trunk rotation, small bridges and isometric abs Clamshells hooklying green tband STM to the upper traps, neck and rhomboids  05/14/22 Recumbent bike L3 x 6 minutes Sit to stand with 2# ball in BUE, chest press at the top, 10 reps. Standing Hip strength facing wall with 2# weight, 5 reps each in abd, diagonally back, hip ext. B Heel raises on bar 10 reps Alternating taps on cones, increased in challenge with rotation and tapping multiple cones, occ min a Crossover step with weight shift, then return to start, min a for balance.   05/12/22 UBE level 2 x 5 minutes Leg press 20# 2x10 Seated row 10# 2x10 Cone toe touches Bend over and touch cone with hand On airex standing,  Ball toss, side stepping, backward walking, direction changes STM to the upper traps in sitting  05/07/22 NuStep L3 x 6 minutes Sit to stand while holding 4# ball in BUE, with chest press at the top. 2 x 10 B side stepping 2 x  4 sets in each direction with red T band resistance at knees. B knee flex 20#, 2 x 12 reps B knee ext 5#, 2 x 12 reps STM to R Up traps LS followed by stretch  04/28/22 Merrilee Jansky 31/56 putting her at a high risk for falls Seated rows 15# 2x10 Lats 15# 2x 6 STM to the right upper trap,rhomboid and cervical area in  sitting  PATIENT EDUCATION:  Education details: Reviewed previous HEP Person educated: Patient Education method: Explanation Education comprehension: verbalized understanding   HOME EXERCISE PROGRAM: [id number]  ASSESSMENT:  CLINICAL IMPRESSION: Continues to report pain in the neck and upper back, does well with exercises is just very stiff and guarded, needs cues for form and to relax.  Still very tight in the upper traps    OBJECTIVE IMPAIRMENTS Abnormal gait, decreased balance, decreased coordination, decreased ROM, decreased strength, decreased safety awareness, hypomobility, impaired flexibility, improper body mechanics, and pain.   ACTIVITY LIMITATIONS carrying, lifting, bending, sitting, standing, squatting, sleeping, stairs, reach over head, hygiene/grooming, and locomotion level  PARTICIPATION LIMITATIONS: meal prep, cleaning, laundry, community activity, and yard work  PERSONAL FACTORS Age, Behavior pattern, Past/current experiences, and 1-2 comorbidities: hx of neck and back surgery, HTN, chronic kidney disease  are also affecting patient's functional outcome.   REHAB POTENTIAL: Fair    CLINICAL DECISION MAKING: Evolving/moderate complexity  EVALUATION COMPLEXITY: Moderate   GOALS: Goals reviewed with patient? No  SHORT TERM GOALS: Target date: 05/27/22  Patient will be independent with initial HEP.  Goal status: met  2.  Patient will report pain levels at worst to be at least 5/10  Baseline: 7/10 Goal status:ongoing    LONG TERM GOALS: Target date: 06/24/22  1.  Patient will demonstrate full pain free cervical ROM for safety with driving.  Goal status: INITIAL  2.  Patient will demonstrate improved posture to decrease muscle imbalance. Goal status: INITIAL  3.  Patient will report 47 on FOTO (patient outcome measure)  to demonstrate improved functional ability.  Baseline: 37 Goal status: INITIAL  6.  Patient will increase BERG score by 10 points to  demonstrate improved balance and decreased fall risk.  Goal status: INITIAL    PLAN: PT FREQUENCY: 2x/week  PT DURATION: 8 weeks  PLANNED INTERVENTIONS: Therapeutic exercises, Therapeutic activity, Neuromuscular re-education, Balance training, Gait training, Patient/Family education, Self Care, Joint mobilization, Dry Needling, Electrical stimulation, Spinal mobilization, Cryotherapy, Moist heat, Taping, Vasopneumatic device, Traction, Ultrasound, Ionotophoresis 60m/ml Dexamethasone, Manual therapy, and Re-evaluation  PLAN FOR NEXT SESSION: work on strength, balance and treat spasms as needed  MLum Babe PT 05/19/2022, 1:21 PM

## 2022-05-21 ENCOUNTER — Ambulatory Visit: Payer: Medicare Other | Admitting: Physical Therapy

## 2022-05-21 DIAGNOSIS — R262 Difficulty in walking, not elsewhere classified: Secondary | ICD-10-CM

## 2022-05-21 DIAGNOSIS — M5412 Radiculopathy, cervical region: Secondary | ICD-10-CM

## 2022-05-21 DIAGNOSIS — R2681 Unsteadiness on feet: Secondary | ICD-10-CM

## 2022-05-21 DIAGNOSIS — M6281 Muscle weakness (generalized): Secondary | ICD-10-CM

## 2022-05-21 DIAGNOSIS — R278 Other lack of coordination: Secondary | ICD-10-CM

## 2022-05-21 NOTE — Therapy (Signed)
OUTPATIENT PHYSICAL THERAPY CERVICAL TREATMENT   Patient Name: Becky Hobbs MRN: [id number] DOB:Feb 09, 1946, 76 y.o., female Today's Date: 05/21/2022   PT End of Session - 05/21/22 1321     Visit Number 7    Date for PT Re-Evaluation 06/24/22    PT Start Time 1319    PT Stop Time 1400    PT Time Calculation (min) 41 min    Activity Tolerance Patient tolerated treatment well;Patient limited by pain    Behavior During Therapy WFL for tasks assessed/performed               Past Medical History:  Diagnosis Date   Anemia    Anxiety    panic attacks   Arthritis    Asthma    Blood transfusion    1973--with twins birth   Bronchitis    Chronic kidney disease    Diabetes mellitus    borderline   GERD (gastroesophageal reflux disease)    H/O hiatal hernia    surgery fixed   Headache    migraines   Heart murmur    MVP   Hepatitis    Hep C   Hypertension    Hypothyroidism    Pneumonia    Past Surgical History:  Procedure Laterality Date   ABDOMINAL HYSTERECTOMY     APPENDECTOMY     APPLICATION OF INTRAOPERATIVE CT SCAN N/A 06/05/2021   Procedure: APPLICATION OF INTRAOPERATIVE CT SCAN;  Surgeon: Karsten Ro, DO;  Location: [place];  Service: Neurosurgery;  Laterality: N/A;   BACK SURGERY     bladder tack     BREAST CYST EXCISION     BREAST CYST INCISION AND DRAINAGE     BREAST SURGERY     BUNIONECTOMY     CHOLECYSTECTOMY     DILATION AND CURETTAGE OF UTERUS     ESOPHAGOGASTRODUODENOSCOPY (EGD) WITH PROPOFOL N/A 01/03/2019   Procedure: ESOPHAGOGASTRODUODENOSCOPY (EGD) WITH PROPOFOL;  Surgeon: Otis Brace, MD;  Location: [place];  Service: Gastroenterology;  Laterality: N/A;   FOREIGN BODY REMOVAL  01/03/2019   Procedure: FOREIGN BODY REMOVAL;  Surgeon: Otis Brace, MD;  Location: [facility];  Service: Gastroenterology;;   FRACTURE SURGERY     left wrist   HERNIA REPAIR     hiatal hernia   LUMBAR FUSION  07/26/2017   LUMBAR LAMINECTOMY   03/04/2012   Procedure: MICRODISCECTOMY LUMBAR LAMINECTOMY;  Surgeon: Jessy Oto, MD;  Location: [place];  Service: Orthopedics;  Laterality: N/A;  L3-4 central laminectomy   NASAL SEPTUM SURGERY     nissan fundoplication     OVARIAN CYST SURGERY     POSTERIOR CERVICAL FUSION/FORAMINOTOMY N/A 06/05/2021   Procedure: OCCIPITAL- CERVICAL TWO INSTRUMENTATION AND FUSION; EXTENSION TO CERVICAL FIVE;  Surgeon: Karsten Ro, DO;  Location: [place];  Service: Neurosurgery;  Laterality: N/A;   THORACIC FUSION  07/26/2017   TONSILLECTOMY     Patient Active Problem List   Diagnosis Date Noted   Neurogenic bladder 04/03/2022   Spondylosis, cervical, with myelopathy 04/03/2022   Myofascial pain dysfunction syndrome 12/17/2021   Hepatic cirrhosis ([place]) 06/26/2021   Fracture of C1 vertebra, closed ([place]) 06/09/2021   Closed C1 fracture ([place]) 06/05/2021   Plantar fasciitis of left foot 08/21/2018   Acute renal failure (ARF) ([place]) 02/09/2018   Chronic migraine without aura without status migrainosus, not intractable 06/16/2017   History of lumbar fusion 08/03/2016    Class: Chronic   Migraine 04/09/2016   Acute-on-chronic kidney injury ([place]) 09/07/2015  Hypertension 09/07/2015   Hypothyroidism 09/07/2015   Gout 09/07/2015   Low back pain 09/07/2015   Obesity (BMI 30-39.9) 07/06/2013   Headache 04/12/2013   Concussion 04/12/2013   Lumbar spinal stenosis 03/04/2012    Class: Diagnosis of    PCP: Marton Redwood  REFERRING PROVIDER: Courtney Heys  REFERRING DIAG: M79.18, M54.2, Z98.890, M47.12  THERAPY DIAG:  Radiculopathy, cervical region  Difficulty in walking, not elsewhere classified  Muscle weakness (generalized)  Other lack of coordination  Unsteadiness on feet  Rationale for Evaluation and Treatment Rehabilitation  ONSET DATE: 04/03/22  SUBJECTIVE:                                                                                                                                                                                                          SUBJECTIVE STATEMENT: Feeling stiff and sluggish, the weather has my allergies going bad  PERTINENT HISTORY:  Lumbar fusions and laminectomy, cervical fusion 2022  PAIN:  Are you having pain? Yes: NPRS scale: 5/10 Pain location: neck Pain description: achy, it just hurts Aggravating factors: bending, stretching, reaching overhead Relieving factors: pain med sometimes but I do not like taking them  PRECAUTIONS: Fall  WEIGHT BEARING RESTRICTIONS No  FALLS:  Has patient fallen in last 6 months? Yes. Number of falls 5 or 6   LIVING ENVIRONMENT: Lives with: lives alone Lives in: House/apartment Stairs: Yes: Internal: 14 steps; on left going up Has following equipment at home: Single point cane and Walker - 2 wheeled  OCCUPATION: retired  PLOF: Independent  PATIENT GOALS get my neck and back fixed, get my balance  OBJECTIVE:   DIAGNOSTIC FINDINGS:  FINDINGS: Single cross-table lateral portable spot fluoro image obtained of the upper cervical spine. Support catheters are seen overlying the hypopharynx.   IMPRESSION: Intraoperative localization during posterior cervical fusion.  PATIENT SURVEYS:  FOTO 37   COGNITION: Overall cognitive status: Within functional limits for tasks assessed   SENSATION: WFL  POSTURE: rounded shoulders, forward head, increased thoracic kyphosis, and flexed trunk   PALPATION: TTP and tightness along c-spine and UTs   CERVICAL ROM:   Active ROM A/PROM (deg) eval  Flexion 75% w/pain  Extension Very limited due to pain  Right lateral flexion Limited due to pain  Left lateral flexion Limited due to pain  Right rotation 20d w/pain  Left rotation 20d w/pain   (Blank rows = not tested)  UPPER EXTREMITY ROM:  Active ROM Right eval Left eval  Shoulder flexion 110 110  Shoulder extension    Shoulder abduction 90  90  Shoulder adduction    Shoulder extension     Shoulder internal rotation 55 50  Shoulder external rotation C7 C7  Elbow flexion    Elbow extension    Wrist flexion    Wrist extension    Wrist ulnar deviation    Wrist radial deviation    Wrist pronation    Wrist supination     (Blank rows = not tested)  UPPER EXTREMITY MMT:  grossly 4+/5 for all shoulder MMT and LE   FUNCTIONAL TESTS:  5 times sit to stand: 15.29s Timed up and go (TUG): 13.09s Berg Balance Scale: 31/56 tested on 04/28/22  VESTIBULAR TESTING Smooth pursuits and saccades are normal, no nystagmus   TODAY'S TREATMENT:  05/21/22 UBE level 3.2 x 6 minutes Seated row 10# 2x10 Lats 15# 2x10 Leg press 20# 2x10 Leg curls 15# 2x10 Leg extension 5# 2x10 Balance ball toss, cone toe touches, on airex reaching STM to the neck Side stepping over cane  05/19/22 Bike Level 3 x 6 minutes Seated row 10# 2x10 Lats 15# 2x10 Leg press 20# 2x10 Feet on ball K2C, trunk rotation, small bridges and isometric abs Clamshells hooklying green tband STM to the upper traps, neck and rhomboids  05/14/22 Recumbent bike L3 x 6 minutes Sit to stand with 2# ball in BUE, chest press at the top, 10 reps. Standing Hip strength facing wall with 2# weight, 5 reps each in abd, diagonally back, hip ext. B Heel raises on bar 10 reps Alternating taps on cones, increased in challenge with rotation and tapping multiple cones, occ min a Crossover step with weight shift, then return to start, min a for balance.   05/12/22 UBE level 2 x 5 minutes Leg press 20# 2x10 Seated row 10# 2x10 Cone toe touches Bend over and touch cone with hand On airex standing,  Ball toss, side stepping, backward walking, direction changes STM to the upper traps in sitting PATIENT EDUCATION:  Education details: Reviewed previous HEP Person educated: Patient Education method: Explanation Education comprehension: verbalized understanding   HOME EXERCISE PROGRAM: [id number]  ASSESSMENT:  CLINICAL  IMPRESSION: Reports doing okay, allergies are bothering her.  She has issues moving to the right with LOB, still some significant spasms and knots in the upper trap and neck   OBJECTIVE IMPAIRMENTS Abnormal gait, decreased balance, decreased coordination, decreased ROM, decreased strength, decreased safety awareness, hypomobility, impaired flexibility, improper body mechanics, and pain.   ACTIVITY LIMITATIONS carrying, lifting, bending, sitting, standing, squatting, sleeping, stairs, reach over head, hygiene/grooming, and locomotion level  PARTICIPATION LIMITATIONS: meal prep, cleaning, laundry, community activity, and yard work  PERSONAL FACTORS Age, Behavior pattern, Past/current experiences, and 1-2 comorbidities: hx of neck and back surgery, HTN, chronic kidney disease  are also affecting patient's functional outcome.   REHAB POTENTIAL: Fair    CLINICAL DECISION MAKING: Evolving/moderate complexity  EVALUATION COMPLEXITY: Moderate   GOALS: Goals reviewed with patient? No  SHORT TERM GOALS: Target date: 05/27/22  Patient will be independent with initial HEP.  Goal status: met  2.  Patient will report pain levels at worst to be at least 5/10  Baseline: 7/10 Goal status:ongoing    LONG TERM GOALS: Target date: 06/24/22  1.  Patient will demonstrate full pain free cervical ROM for safety with driving.  Goal status: on going  2.  Patient will demonstrate improved posture to decrease muscle imbalance. Goal status: ongoing  3.  Patient will report 22 on FOTO (patient outcome measure)  to demonstrate improved  functional ability.  Baseline: 37 Goal status: INITIAL  6.  Patient will increase BERG score by 10 points to demonstrate improved balance and decreased fall risk.  Goal status: INITIAL    PLAN: PT FREQUENCY: 2x/week  PT DURATION: 8 weeks  PLANNED INTERVENTIONS: Therapeutic exercises, Therapeutic activity, Neuromuscular re-education, Balance training, Gait  training, Patient/Family education, Self Care, Joint mobilization, Dry Needling, Electrical stimulation, Spinal mobilization, Cryotherapy, Moist heat, Taping, Vasopneumatic device, Traction, Ultrasound, Ionotophoresis 70m/ml Dexamethasone, Manual therapy, and Re-evaluation  PLAN FOR NEXT SESSION: work on strength, balance and treat spasms as needed  MLum Babe PT 05/21/2022, 1:21 PM

## 2022-06-02 ENCOUNTER — Ambulatory Visit: Payer: Medicare Other | Admitting: Physical Therapy

## 2022-06-04 ENCOUNTER — Other Ambulatory Visit: Payer: Self-pay | Admitting: Nurse Practitioner

## 2022-06-04 DIAGNOSIS — K7469 Other cirrhosis of liver: Secondary | ICD-10-CM

## 2022-06-05 ENCOUNTER — Encounter: Payer: Self-pay | Admitting: Physical Therapy

## 2022-06-05 ENCOUNTER — Ambulatory Visit: Payer: Medicare Other | Admitting: Physical Therapy

## 2022-06-05 DIAGNOSIS — M5412 Radiculopathy, cervical region: Secondary | ICD-10-CM | POA: Diagnosis not present

## 2022-06-05 DIAGNOSIS — R262 Difficulty in walking, not elsewhere classified: Secondary | ICD-10-CM

## 2022-06-05 DIAGNOSIS — M6281 Muscle weakness (generalized): Secondary | ICD-10-CM

## 2022-06-05 NOTE — Therapy (Signed)
OUTPATIENT PHYSICAL THERAPY CERVICAL TREATMENT   Patient Name: Becky Hobbs MRN: [id number] DOB:[birth year], 76 y.o., female Today's Date: 06/05/2022   PT End of Session - 06/05/22 0931     Visit Number 8    Date for PT Re-Evaluation 06/24/22    PT Start Time 0930    PT Stop Time 1015    PT Time Calculation (min) 45 min    Activity Tolerance Patient tolerated treatment well    Behavior During Therapy WFL for tasks assessed/performed               Past Medical History:  Diagnosis Date   Anemia    Anxiety    panic attacks   Arthritis    Asthma    Blood transfusion    1973--with twins birth   Bronchitis    Chronic kidney disease    Diabetes mellitus    borderline   GERD (gastroesophageal reflux disease)    H/O hiatal hernia    surgery fixed   Headache    migraines   Heart murmur    MVP   Hepatitis    Hep C   Hypertension    Hypothyroidism    Pneumonia    Past Surgical History:  Procedure Laterality Date   ABDOMINAL HYSTERECTOMY     APPENDECTOMY     APPLICATION OF INTRAOPERATIVE CT SCAN N/A 06/05/2021   Procedure: APPLICATION OF INTRAOPERATIVE CT SCAN;  Surgeon: Karsten Ro, DO;  Location: [place];  Service: Neurosurgery;  Laterality: N/A;   BACK SURGERY     bladder tack     BREAST CYST EXCISION     BREAST CYST INCISION AND DRAINAGE     BREAST SURGERY     BUNIONECTOMY     CHOLECYSTECTOMY     DILATION AND CURETTAGE OF UTERUS     ESOPHAGOGASTRODUODENOSCOPY (EGD) WITH PROPOFOL N/A 01/03/2019   Procedure: ESOPHAGOGASTRODUODENOSCOPY (EGD) WITH PROPOFOL;  Surgeon: Otis Brace, MD;  Location: [facility];  Service: Gastroenterology;  Laterality: N/A;   FOREIGN BODY REMOVAL  01/03/2019   Procedure: FOREIGN BODY REMOVAL;  Surgeon: Otis Brace, MD;  Location: [facility];  Service: Gastroenterology;;   FRACTURE SURGERY     left wrist   HERNIA REPAIR     hiatal hernia   LUMBAR FUSION  07/26/2017   LUMBAR LAMINECTOMY  03/04/2012   Procedure:  MICRODISCECTOMY LUMBAR LAMINECTOMY;  Surgeon: Jessy Oto, MD;  Location: [facility];  Service: Orthopedics;  Laterality: N/A;  L3-4 central laminectomy   NASAL SEPTUM SURGERY     nissan fundoplication     OVARIAN CYST SURGERY     POSTERIOR CERVICAL FUSION/FORAMINOTOMY N/A 06/05/2021   Procedure: OCCIPITAL- CERVICAL TWO INSTRUMENTATION AND FUSION; EXTENSION TO CERVICAL FIVE;  Surgeon: Karsten Ro, DO;  Location: [place];  Service: Neurosurgery;  Laterality: N/A;   THORACIC FUSION  07/26/2017   TONSILLECTOMY     Patient Active Problem List   Diagnosis Date Noted   Neurogenic bladder 04/03/2022   Spondylosis, cervical, with myelopathy 04/03/2022   Myofascial pain dysfunction syndrome 12/17/2021   Hepatic cirrhosis ([place]) 06/26/2021   Fracture of C1 vertebra, closed ([place]) 06/09/2021   Closed C1 fracture ([place]) 06/05/2021   Plantar fasciitis of left foot 08/21/2018   Acute renal failure (ARF) ([place]) 02/09/2018   Chronic migraine without aura without status migrainosus, not intractable 06/16/2017   History of lumbar fusion 08/03/2016    Class: Chronic   Migraine 04/09/2016   Acute-on-chronic kidney injury ([place]) 09/07/2015   Hypertension  09/07/2015   Hypothyroidism 09/07/2015   Gout 09/07/2015   Low back pain 09/07/2015   Obesity (BMI 30-39.9) 07/06/2013   Headache 04/12/2013   Concussion 04/12/2013   Lumbar spinal stenosis 03/04/2012    Class: Diagnosis of    PCP: Marton Redwood  REFERRING PROVIDER: Courtney Heys  REFERRING DIAG: M79.18, M54.2, Z98.890, M47.12  THERAPY DIAG:  Radiculopathy, cervical region  Difficulty in walking, not elsewhere classified  Muscle weakness (generalized)  Rationale for Evaluation and Treatment Rehabilitation  ONSET DATE: 04/03/22  SUBJECTIVE:                                                                                                                                                                                                          SUBJECTIVE STATEMENT: Doing ok""  PERTINENT HISTORY:  Lumbar fusions and laminectomy, cervical fusion 2022  PAIN:  Are you having pain? Yes: NPRS scale: 0/10 Pain location: neck Pain description: achy, it just hurts Aggravating factors: bending, stretching, reaching overhead Relieving factors: pain med sometimes but I do not like taking them  PRECAUTIONS: Fall  WEIGHT BEARING RESTRICTIONS No  FALLS:  Has patient fallen in last 6 months? Yes. Number of falls 5 or 6   LIVING ENVIRONMENT: Lives with: lives alone Lives in: House/apartment Stairs: Yes: Internal: 14 steps; on left going up Has following equipment at home: Single point cane and Walker - 2 wheeled  OCCUPATION: retired  PLOF: Independent  PATIENT GOALS get my neck and back fixed, get my balance  OBJECTIVE:   DIAGNOSTIC FINDINGS:  FINDINGS: Single cross-table lateral portable spot fluoro image obtained of the upper cervical spine. Support catheters are seen overlying the hypopharynx.   IMPRESSION: Intraoperative localization during posterior cervical fusion.  PATIENT SURVEYS:  FOTO 37   COGNITION: Overall cognitive status: Within functional limits for tasks assessed   SENSATION: WFL  POSTURE: rounded shoulders, forward head, increased thoracic kyphosis, and flexed trunk   PALPATION: TTP and tightness along c-spine and UTs   CERVICAL ROM:   Active ROM A/PROM (deg) eval  Flexion 75% w/pain  Extension Very limited due to pain  Right lateral flexion Limited due to pain  Left lateral flexion Limited due to pain  Right rotation 20d w/pain  Left rotation 20d w/pain   (Blank rows = not tested)  UPPER EXTREMITY ROM:  Active ROM Right eval Left eval  Shoulder flexion 110 110  Shoulder extension    Shoulder abduction 90 90  Shoulder adduction    Shoulder extension    Shoulder internal rotation 55 50  Shoulder  external rotation C7 C7  Elbow flexion    Elbow extension    Wrist flexion     Wrist extension    Wrist ulnar deviation    Wrist radial deviation    Wrist pronation    Wrist supination     (Blank rows = not tested)  UPPER EXTREMITY MMT:  grossly 4+/5 for all shoulder MMT and LE   FUNCTIONAL TESTS:  5 times sit to stand: 15.29s Timed up and go (TUG): 13.09s Berg Balance Scale: 31/56 tested on 04/28/22  VESTIBULAR TESTING Smooth pursuits and saccades are normal, no nystagmus   TODAY'S TREATMENT:  03/05/22 NuStep L5 x 6 min  Seated Rows 15lb 2x10 Lat pull 15lb 2x10 Leg press 2x10 20lb  Leg curls 20lb 2x10 Leg Ext 5lb 2x10 Alt 4 in box taps 2x5  STM UT and neck  05/21/22 UBE level 3.2 x 6 minutes Seated row 10# 2x10 Lats 15# 2x10 Leg press 20# 2x10 Leg curls 15# 2x10 Leg extension 5# 2x10 Balance ball toss, cone toe touches, on airex reaching STM to the neck Side stepping over cane  05/19/22 Bike Level 3 x 6 minutes Seated row 10# 2x10 Lats 15# 2x10 Leg press 20# 2x10 Feet on ball K2C, trunk rotation, small bridges and isometric abs Clamshells hooklying green tband STM to the upper traps, neck and rhomboids  05/14/22 Recumbent bike L3 x 6 minutes Sit to stand with 2# ball in BUE, chest press at the top, 10 reps. Standing Hip strength facing wall with 2# weight, 5 reps each in abd, diagonally back, hip ext. B Heel raises on bar 10 reps Alternating taps on cones, increased in challenge with rotation and tapping multiple cones, occ min a Crossover step with weight shift, then return to start, min a for balance.   05/12/22 UBE level 2 x 5 minutes Leg press 20# 2x10 Seated row 10# 2x10 Cone toe touches Bend over and touch cone with hand On airex standing,  Ball toss, side stepping, backward walking, direction changes STM to the upper traps in sitting PATIENT EDUCATION:  Education details: Reviewed previous HEP Person educated: Patient Education method: Explanation Education comprehension: verbalized understanding   HOME EXERCISE  PROGRAM: [id number]  ASSESSMENT:  CLINICAL IMPRESSION: Reports doing okay, ptappears to have a lack of energy today/.  She reports needing to work on ger legs. Session focused on total body strengthening with some MT to UT and cervical spine.    OBJECTIVE IMPAIRMENTS Abnormal gait, decreased balance, decreased coordination, decreased ROM, decreased strength, decreased safety awareness, hypomobility, impaired flexibility, improper body mechanics, and pain.   ACTIVITY LIMITATIONS carrying, lifting, bending, sitting, standing, squatting, sleeping, stairs, reach over head, hygiene/grooming, and locomotion level  PARTICIPATION LIMITATIONS: meal prep, cleaning, laundry, community activity, and yard work  PERSONAL FACTORS Age, Behavior pattern, Past/current experiences, and 1-2 comorbidities: hx of neck and back surgery, HTN, chronic kidney disease  are also affecting patient's functional outcome.   REHAB POTENTIAL: Fair    CLINICAL DECISION MAKING: Evolving/moderate complexity  EVALUATION COMPLEXITY: Moderate   GOALS: Goals reviewed with patient? No  SHORT TERM GOALS: Target date: 05/27/22  Patient will be independent with initial HEP.  Goal status: met  2.  Patient will report pain levels at worst to be at least 5/10  Baseline: 7/10 Goal status:ongoing    LONG TERM GOALS: Target date: 06/24/22  1.  Patient will demonstrate full pain free cervical ROM for safety with driving.  Goal status: on going  2.  Patient  will demonstrate improved posture to decrease muscle imbalance. Goal status: ongoing  3.  Patient will report 68 on FOTO (patient outcome measure)  to demonstrate improved functional ability.  Baseline: 37 Goal status: INITIAL  6.  Patient will increase BERG score by 10 points to demonstrate improved balance and decreased fall risk.  Goal status: INITIAL    PLAN: PT FREQUENCY: 2x/week  PT DURATION: 8 weeks  PLANNED INTERVENTIONS: Therapeutic exercises,  Therapeutic activity, Neuromuscular re-education, Balance training, Gait training, Patient/Family education, Self Care, Joint mobilization, Dry Needling, Electrical stimulation, Spinal mobilization, Cryotherapy, Moist heat, Taping, Vasopneumatic device, Traction, Ultrasound, Ionotophoresis 6m/ml Dexamethasone, Manual therapy, and Re-evaluation  PLAN FOR NEXT SESSION: work on strength, balance and treat spasms as needed  MLum Babe PT 06/05/2022, 9:35 AM

## 2022-06-09 ENCOUNTER — Ambulatory Visit: Payer: Medicare Other | Admitting: Physical Therapy

## 2022-06-09 ENCOUNTER — Encounter: Payer: Self-pay | Admitting: Physical Therapy

## 2022-06-09 DIAGNOSIS — M5412 Radiculopathy, cervical region: Secondary | ICD-10-CM

## 2022-06-09 DIAGNOSIS — R262 Difficulty in walking, not elsewhere classified: Secondary | ICD-10-CM

## 2022-06-09 DIAGNOSIS — M6281 Muscle weakness (generalized): Secondary | ICD-10-CM

## 2022-06-09 DIAGNOSIS — R2681 Unsteadiness on feet: Secondary | ICD-10-CM

## 2022-06-09 NOTE — Therapy (Signed)
OUTPATIENT PHYSICAL THERAPY CERVICAL TREATMENT   Patient Name: Becky Hobbs MRN: [id number] DOB:1946/04/19, 76 y.o., female Today's Date: 06/09/2022   PT End of Session - 06/09/22 1432     Visit Number 9    Date for PT Re-Evaluation 06/24/22    PT Start Time 27    PT Stop Time 1515    PT Time Calculation (min) 45 min    Activity Tolerance Patient tolerated treatment well    Behavior During Therapy WFL for tasks assessed/performed               Past Medical History:  Diagnosis Date   Anemia    Anxiety    panic attacks   Arthritis    Asthma    Blood transfusion    1973--with twins birth   Bronchitis    Chronic kidney disease    Diabetes mellitus    borderline   GERD (gastroesophageal reflux disease)    H/O hiatal hernia    surgery fixed   Headache    migraines   Heart murmur    MVP   Hepatitis    Hep C   Hypertension    Hypothyroidism    Pneumonia    Past Surgical History:  Procedure Laterality Date   ABDOMINAL HYSTERECTOMY     APPENDECTOMY     APPLICATION OF INTRAOPERATIVE CT SCAN N/A 06/05/2021   Procedure: APPLICATION OF INTRAOPERATIVE CT SCAN;  Surgeon: Karsten Ro, DO;  Location: [facility];  Service: Neurosurgery;  Laterality: N/A;   BACK SURGERY     bladder tack     BREAST CYST EXCISION     BREAST CYST INCISION AND DRAINAGE     BREAST SURGERY     BUNIONECTOMY     CHOLECYSTECTOMY     DILATION AND CURETTAGE OF UTERUS     ESOPHAGOGASTRODUODENOSCOPY (EGD) WITH PROPOFOL N/A 01/03/2019   Procedure: ESOPHAGOGASTRODUODENOSCOPY (EGD) WITH PROPOFOL;  Surgeon: Otis Brace, MD;  Location: [facility];  Service: Gastroenterology;  Laterality: N/A;   FOREIGN BODY REMOVAL  01/03/2019   Procedure: FOREIGN BODY REMOVAL;  Surgeon: Otis Brace, MD;  Location: [facility];  Service: Gastroenterology;;   FRACTURE SURGERY     left wrist   HERNIA REPAIR     hiatal hernia   LUMBAR FUSION  07/26/2017   LUMBAR LAMINECTOMY  03/04/2012   Procedure:  MICRODISCECTOMY LUMBAR LAMINECTOMY;  Surgeon: Jessy Oto, MD;  Location: [facility];  Service: Orthopedics;  Laterality: N/A;  L3-4 central laminectomy   NASAL SEPTUM SURGERY     nissan fundoplication     OVARIAN CYST SURGERY     POSTERIOR CERVICAL FUSION/FORAMINOTOMY N/A 06/05/2021   Procedure: OCCIPITAL- CERVICAL TWO INSTRUMENTATION AND FUSION; EXTENSION TO CERVICAL FIVE;  Surgeon: Karsten Ro, DO;  Location: [place];  Service: Neurosurgery;  Laterality: N/A;   THORACIC FUSION  07/26/2017   TONSILLECTOMY     Patient Active Problem List   Diagnosis Date Noted   Neurogenic bladder 04/03/2022   Spondylosis, cervical, with myelopathy 04/03/2022   Myofascial pain dysfunction syndrome 12/17/2021   Hepatic cirrhosis ([place]) 06/26/2021   Fracture of C1 vertebra, closed ([place]) 06/09/2021   Closed C1 fracture ([place]) 06/05/2021   Plantar fasciitis of left foot 08/21/2018   Acute renal failure (ARF) ([place]) 02/09/2018   Chronic migraine without aura without status migrainosus, not intractable 06/16/2017   History of lumbar fusion 08/03/2016    Class: Chronic   Migraine 04/09/2016   Acute-on-chronic kidney injury ([place]) 09/07/2015   Hypertension  09/07/2015   Hypothyroidism 09/07/2015   Gout 09/07/2015   Low back pain 09/07/2015   Obesity (BMI 30-39.9) 07/06/2013   Headache 04/12/2013   Concussion 04/12/2013   Lumbar spinal stenosis 03/04/2012    Class: Diagnosis of    PCP: Marton Redwood  REFERRING PROVIDER: Courtney Heys  REFERRING DIAG: M79.18, M54.2, Z98.890, M47.12  THERAPY DIAG:  Radiculopathy, cervical region  Difficulty in walking, not elsewhere classified  Muscle weakness (generalized)  Unsteadiness on feet  Rationale for Evaluation and Treatment Rehabilitation  ONSET DATE: 04/03/22  SUBJECTIVE:                                                                                                                                                                                                          SUBJECTIVE STATEMENT: "ok"  PERTINENT HISTORY:  Lumbar fusions and laminectomy, cervical fusion 2022  PAIN:  Are you having pain? Yes: NPRS scale: 0/10 Pain location: neck Pain description: achy, it just hurts Aggravating factors: bending, stretching, reaching overhead Relieving factors: pain med sometimes but I do not like taking them  PRECAUTIONS: Fall  WEIGHT BEARING RESTRICTIONS No  FALLS:  Has patient fallen in last 6 months? Yes. Number of falls 5 or 6   LIVING ENVIRONMENT: Lives with: lives alone Lives in: House/apartment Stairs: Yes: Internal: 14 steps; on left going up Has following equipment at home: Single point cane and Walker - 2 wheeled  OCCUPATION: retired  PLOF: Independent  PATIENT GOALS get my neck and back fixed, get my balance  OBJECTIVE:   DIAGNOSTIC FINDINGS:  FINDINGS: Single cross-table lateral portable spot fluoro image obtained of the upper cervical spine. Support catheters are seen overlying the hypopharynx.   IMPRESSION: Intraoperative localization during posterior cervical fusion.  PATIENT SURVEYS:  FOTO 37   COGNITION: Overall cognitive status: Within functional limits for tasks assessed   SENSATION: WFL  POSTURE: rounded shoulders, forward head, increased thoracic kyphosis, and flexed trunk   PALPATION: TTP and tightness along c-spine and UTs   CERVICAL ROM:   Active ROM A/PROM (deg) eval  Flexion 75% w/pain  Extension Very limited due to pain  Right lateral flexion Limited due to pain  Left lateral flexion Limited due to pain  Right rotation 20d w/pain  Left rotation 20d w/pain   (Blank rows = not tested)  UPPER EXTREMITY ROM:  Active ROM Right eval Left eval  Shoulder flexion 110 110  Shoulder extension    Shoulder abduction 90 90  Shoulder adduction    Shoulder extension    Shoulder internal rotation 55  50  Shoulder external rotation C7 C7  Elbow flexion    Elbow extension    Wrist  flexion    Wrist extension    Wrist ulnar deviation    Wrist radial deviation    Wrist pronation    Wrist supination     (Blank rows = not tested)  UPPER EXTREMITY MMT:  grossly 4+/5 for all shoulder MMT and LE   FUNCTIONAL TESTS:  5 times sit to stand: 15.29s Timed up and go (TUG): 13.09s Berg Balance Scale: 31/56 tested on 04/28/22  VESTIBULAR TESTING Smooth pursuits and saccades are normal, no nystagmus   TODAY'S TREATMENT:  06/09/22 UBE L2 x 3 min each Seated rows 20lb 2x10 Lat pull downs 2x10 Leg press 30lb 2x10 Shoulder Ext 5lb 2x10  Leg curls 20lb 2x10  Leg Ext 10lb 2x10 Shoulder ER red 2x10 Side step in // on balance beam Side step on an doff airex   06/05/22 NuStep L5 x 6 min  Seated Rows 15lb 2x10 Lat pull 15lb 2x10 Leg press 2x10 20lb  Leg curls 20lb 2x10 Leg Ext 5lb 2x10 Alt 4 in box taps 2x5  STM UT and neck  05/21/22 UBE level 3.2 x 6 minutes Seated row 10# 2x10 Lats 15# 2x10 Leg press 20# 2x10 Leg curls 15# 2x10 Leg extension 5# 2x10 Balance ball toss, cone toe touches, on airex reaching STM to the neck Side stepping over cane  05/19/22 Bike Level 3 x 6 minutes Seated row 10# 2x10 Lats 15# 2x10 Leg press 20# 2x10 Feet on ball K2C, trunk rotation, small bridges and isometric abs Clamshells hooklying green tband STM to the upper traps, neck and rhomboids  05/14/22 Recumbent bike L3 x 6 minutes Sit to stand with 2# ball in BUE, chest press at the top, 10 reps. Standing Hip strength facing wall with 2# weight, 5 reps each in abd, diagonally back, hip ext. B Heel raises on bar 10 reps Alternating taps on cones, increased in challenge with rotation and tapping multiple cones, occ min a Crossover step with weight shift, then return to start, min a for balance.   PATIENT EDUCATION:  Education details: Reviewed previous HEP Person educated: Patient Education method: Explanation Education comprehension: verbalized understanding   HOME EXERCISE  PROGRAM: [id number]  ASSESSMENT:  CLINICAL IMPRESSION: Pt enters feeling well. Session focused on total body strengthening and balance. Increase resistance tolerated on all machine level interventions. Some LE fatigue reported after leg extensions. UE use needed at times to maintain stability in parallel bars. Stand by and some CGA needed with side steps on airex.   OBJECTIVE IMPAIRMENTS Abnormal gait, decreased balance, decreased coordination, decreased ROM, decreased strength, decreased safety awareness, hypomobility, impaired flexibility, improper body mechanics, and pain.   ACTIVITY LIMITATIONS carrying, lifting, bending, sitting, standing, squatting, sleeping, stairs, reach over head, hygiene/grooming, and locomotion level  PARTICIPATION LIMITATIONS: meal prep, cleaning, laundry, community activity, and yard work  PERSONAL FACTORS Age, Behavior pattern, Past/current experiences, and 1-2 comorbidities: hx of neck and back surgery, HTN, chronic kidney disease  are also affecting patient's functional outcome.   REHAB POTENTIAL: Fair    CLINICAL DECISION MAKING: Evolving/moderate complexity  EVALUATION COMPLEXITY: Moderate   GOALS: Goals reviewed with patient? No  SHORT TERM GOALS: Target date: 05/27/22  Patient will be independent with initial HEP.  Goal status: met  2.  Patient will report pain levels at worst to be at least 5/10  Baseline: 7/10 Goal status:ongoing    LONG TERM GOALS: Target date:  06/24/22  1.  Patient will demonstrate full pain free cervical ROM for safety with driving.  Goal status: on going  2.  Patient will demonstrate improved posture to decrease muscle imbalance. Goal status: ongoing  3.  Patient will report 18 on FOTO (patient outcome measure)  to demonstrate improved functional ability.  Baseline: 37 Goal status: INITIAL  6.  Patient will increase BERG score by 10 points to demonstrate improved balance and decreased fall risk.  Goal status:  INITIAL    PLAN: PT FREQUENCY: 2x/week  PT DURATION: 8 weeks  PLANNED INTERVENTIONS: Therapeutic exercises, Therapeutic activity, Neuromuscular re-education, Balance training, Gait training, Patient/Family education, Self Care, Joint mobilization, Dry Needling, Electrical stimulation, Spinal mobilization, Cryotherapy, Moist heat, Taping, Vasopneumatic device, Traction, Ultrasound, Ionotophoresis 36m/ml Dexamethasone, Manual therapy, and Re-evaluation  PLAN FOR NEXT SESSION: work on strength, balance and treat spasms as needed  MLum Babe PT 06/09/2022, 2:32 PM

## 2022-06-10 ENCOUNTER — Ambulatory Visit
Admission: RE | Admit: 2022-06-10 | Discharge: 2022-06-10 | Disposition: A | Discharge status: Home / Self Care | Payer: Medicare Other | Source: Ambulatory Visit | Attending: Nurse Practitioner | Admitting: Nurse Practitioner

## 2022-06-10 DIAGNOSIS — K7469 Other cirrhosis of liver: Secondary | ICD-10-CM

## 2022-06-11 ENCOUNTER — Ambulatory Visit: Payer: Medicare Other

## 2022-06-11 DIAGNOSIS — M6281 Muscle weakness (generalized): Secondary | ICD-10-CM

## 2022-06-11 DIAGNOSIS — R262 Difficulty in walking, not elsewhere classified: Secondary | ICD-10-CM

## 2022-06-11 DIAGNOSIS — M5412 Radiculopathy, cervical region: Secondary | ICD-10-CM | POA: Diagnosis not present

## 2022-06-11 DIAGNOSIS — R278 Other lack of coordination: Secondary | ICD-10-CM

## 2022-06-11 DIAGNOSIS — R2681 Unsteadiness on feet: Secondary | ICD-10-CM

## 2022-06-11 DIAGNOSIS — S12000A Unspecified displaced fracture of first cervical vertebra, initial encounter for closed fracture: Secondary | ICD-10-CM

## 2022-06-11 NOTE — Therapy (Signed)
OUTPATIENT PHYSICAL THERAPY CERVICAL TREATMENT   Patient Name: Becky Hobbs MRN: [id number] DOB:[birth year], 76 y.o., female Today's Date: 06/11/2022   PT End of Session - 06/11/22 1501     Visit Number 10    Date for PT Re-Evaluation 06/24/22    PT Start Time 1500    PT Stop Time 1545    PT Time Calculation (min) 45 min    Activity Tolerance Patient tolerated treatment well    Behavior During Therapy [facility] for tasks assessed/performed           Progress Note Reporting Period 04/22/22 to 05/30/22  See note below for Objective Data and Assessment of Progress/Goals.         Past Medical History:  Diagnosis Date   Anemia    Anxiety    panic attacks   Arthritis    Asthma    Blood transfusion    1973--with twins birth   Bronchitis    Chronic kidney disease    Diabetes mellitus    borderline   GERD (gastroesophageal reflux disease)    H/O hiatal hernia    surgery fixed   Headache    migraines   Heart murmur    MVP   Hepatitis    Hep C   Hypertension    Hypothyroidism    Pneumonia    Past Surgical History:  Procedure Laterality Date   ABDOMINAL HYSTERECTOMY     APPENDECTOMY     APPLICATION OF INTRAOPERATIVE CT SCAN N/A 06/05/2021   Procedure: APPLICATION OF INTRAOPERATIVE CT SCAN;  Surgeon: Dawley, Theodoro Doing, DO;  Location: [place];  Service: Neurosurgery;  Laterality: N/A;   BACK SURGERY     bladder tack     BREAST CYST EXCISION     BREAST CYST INCISION AND DRAINAGE     BREAST SURGERY     BUNIONECTOMY     CHOLECYSTECTOMY     DILATION AND CURETTAGE OF UTERUS     ESOPHAGOGASTRODUODENOSCOPY (EGD) WITH PROPOFOL N/A 01/03/2019   Procedure: ESOPHAGOGASTRODUODENOSCOPY (EGD) WITH PROPOFOL;  Surgeon: Otis Brace, MD;  Location: [place];  Service: Gastroenterology;  Laterality: N/A;   FOREIGN BODY REMOVAL  01/03/2019   Procedure: FOREIGN BODY REMOVAL;  Surgeon: Otis Brace, MD;  Location: [facility];  Service: Gastroenterology;;   FRACTURE SURGERY      left wrist   HERNIA REPAIR     hiatal hernia   LUMBAR FUSION  07/26/2017   LUMBAR LAMINECTOMY  03/04/2012   Procedure: MICRODISCECTOMY LUMBAR LAMINECTOMY;  Surgeon: Jessy Oto, MD;  Location: [place];  Service: Orthopedics;  Laterality: N/A;  L3-4 central laminectomy   NASAL SEPTUM SURGERY     nissan fundoplication     OVARIAN CYST SURGERY     POSTERIOR CERVICAL FUSION/FORAMINOTOMY N/A 06/05/2021   Procedure: OCCIPITAL- CERVICAL TWO INSTRUMENTATION AND FUSION; EXTENSION TO CERVICAL FIVE;  Surgeon: Karsten Ro, DO;  Location: [place];  Service: Neurosurgery;  Laterality: N/A;   THORACIC FUSION  07/26/2017   TONSILLECTOMY     Patient Active Problem List   Diagnosis Date Noted   Neurogenic bladder 04/03/2022   Spondylosis, cervical, with myelopathy 04/03/2022   Myofascial pain dysfunction syndrome 12/17/2021   Hepatic cirrhosis ([place]) 06/26/2021   Fracture of C1 vertebra, closed ([place]) 06/09/2021   Closed C1 fracture ([place]) 06/05/2021   Plantar fasciitis of left foot 08/21/2018   Acute renal failure (ARF) ([place]) 02/09/2018   Chronic migraine without aura without status migrainosus, not intractable 06/16/2017   History of  lumbar fusion 08/03/2016    Class: Chronic   Migraine 04/09/2016   Acute-on-chronic kidney injury ([place]) 09/07/2015   Hypertension 09/07/2015   Hypothyroidism 09/07/2015   Gout 09/07/2015   Low back pain 09/07/2015   Obesity (BMI 30-39.9) 07/06/2013   Headache 04/12/2013   Concussion 04/12/2013   Lumbar spinal stenosis 03/04/2012    Class: Diagnosis of    PCP: Marton Redwood  REFERRING PROVIDER: Courtney Heys  REFERRING DIAG: M79.18, M54.2, Z98.890, M47.12  THERAPY DIAG:  Radiculopathy, cervical region  Difficulty in walking, not elsewhere classified  Muscle weakness (generalized)  Unsteadiness on feet  Other lack of coordination  Closed displaced fracture of first cervical vertebra, unspecified fracture morphology, initial encounter  ([place])  Rationale for Evaluation and Treatment Rehabilitation  ONSET DATE: 04/03/22  SUBJECTIVE:                                                                                                                                                                                                         SUBJECTIVE STATEMENT: Thing are going pretty good, my neck and upper back is feeling better.   PERTINENT HISTORY:  Lumbar fusions and laminectomy, cervical fusion 2022  PAIN:  Are you having pain? Yes: NPRS scale: 0/10 Pain location: neck Pain description: achy, it just hurts Aggravating factors: bending, stretching, reaching overhead Relieving factors: pain med sometimes but I do not like taking them  PRECAUTIONS: Fall  WEIGHT BEARING RESTRICTIONS No  FALLS:  Has patient fallen in last 6 months? Yes. Number of falls 5 or 6   LIVING ENVIRONMENT: Lives with: lives alone Lives in: House/apartment Stairs: Yes: Internal: 14 steps; on left going up Has following equipment at home: Single point cane and Walker - 2 wheeled  OCCUPATION: retired  PLOF: Independent  PATIENT GOALS get my neck and back fixed, get my balance  OBJECTIVE:   DIAGNOSTIC FINDINGS:  FINDINGS: Single cross-table lateral portable spot fluoro image obtained of the upper cervical spine. Support catheters are seen overlying the hypopharynx.   IMPRESSION: Intraoperative localization during posterior cervical fusion.  PATIENT SURVEYS:  FOTO 37   COGNITION: Overall cognitive status: Within functional limits for tasks assessed   SENSATION: WFL  POSTURE: rounded shoulders, forward head, increased thoracic kyphosis, and flexed trunk   PALPATION: TTP and tightness along c-spine and UTs   CERVICAL ROM:   Active ROM A/PROM (deg) eval 06/11/22  Flexion 75% w/pain 50%   Extension Very limited due to pain Still very limited   Right lateral flexion Limited due to pain limited  Left lateral flexion  Limited  due to pain Limited   Right rotation 20d w/pain 20d   Left rotation 20d w/pain 20d    (Blank rows = not tested)  UPPER EXTREMITY ROM:  Active ROM Right eval Left eval Right 06/11/22 Left  06/11/22  Shoulder flexion 110 110 120 120  Shoulder extension      Shoulder abduction 90 90 110 110  Shoulder adduction      Shoulder extension      Shoulder internal rotation 55 50 WFL WFL  Shoulder external rotation C7 C7 T2 T2  Elbow flexion      Elbow extension      Wrist flexion      Wrist extension      Wrist ulnar deviation      Wrist radial deviation      Wrist pronation      Wrist supination       (Blank rows = not tested)  UPPER EXTREMITY MMT:  grossly 4+/5 for all shoulder MMT and LE   FUNCTIONAL TESTS:  5 times sit to stand: 15.29s Timed up and go (TUG): 13.09s Berg Balance Scale: 31/56 tested on 04/28/22  VESTIBULAR TESTING Smooth pursuits and saccades are normal, no nystagmus   TODAY'S TREATMENT:  06/11/22 FOTO Shoulder and cervical ROM  5 times sit to stand: 12.43s Timed up and go (TUG): 11.41s Berg Balance Scale: 36/56  UBE L2 x 18mns  Seated row 20# 2x10 Lat pull downs 20# 2x10 Shoulder ext 5# 2x10    06/09/22 UBE L2 x 3 min each Seated rows 20lb 2x10 Lat pull downs 2x10 Leg press 30lb 2x10 Shoulder Ext 5lb 2x10  Leg curls 20lb 2x10  Leg Ext 10lb 2x10 Shoulder ER red 2x10 Side step in // on balance beam Side step on an doff airex   06/05/22 NuStep L5 x 6 min  Seated Rows 15lb 2x10 Lat pull 15lb 2x10 Leg press 2x10 20lb  Leg curls 20lb 2x10 Leg Ext 5lb 2x10 Alt 4 in box taps 2x5  STM UT and neck  05/21/22 UBE level 3.2 x 6 minutes Seated row 10# 2x10 Lats 15# 2x10 Leg press 20# 2x10 Leg curls 15# 2x10 Leg extension 5# 2x10 Balance ball toss, cone toe touches, on airex reaching STM to the neck Side stepping over cane  05/19/22 Bike Level 3 x 6 minutes Seated row 10# 2x10 Lats 15# 2x10 Leg press 20# 2x10 Feet on ball K2C, trunk rotation,  small bridges and isometric abs Clamshells hooklying green tband STM to the upper traps, neck and rhomboids  05/14/22 Recumbent bike L3 x 6 minutes Sit to stand with 2# ball in BUE, chest press at the top, 10 reps. Standing Hip strength facing wall with 2# weight, 5 reps each in abd, diagonally back, hip ext. B Heel raises on bar 10 reps Alternating taps on cones, increased in challenge with rotation and tapping multiple cones, occ min a Crossover step with weight shift, then return to start, min a for balance.   PATIENT EDUCATION:  Education details: Reviewed previous HEP Person educated: Patient Education method: Explanation Education comprehension: verbalized understanding   HOME EXERCISE PROGRAM: [id number]  ASSESSMENT:  CLINICAL IMPRESSION: Completed progress note today. Rechecked functional tests and ROM. Patient continues to have significant cervical range of motion deficits but has improved with UE. She has made good improvements with her balance and states that she feels like she is getting better overall. Focused on more cervical and UE strengthening today. Will benefit from some higher level  balance tasks such as step ups on airex and walking on beam.   OBJECTIVE IMPAIRMENTS Abnormal gait, decreased balance, decreased coordination, decreased ROM, decreased strength, decreased safety awareness, hypomobility, impaired flexibility, improper body mechanics, and pain.   ACTIVITY LIMITATIONS carrying, lifting, bending, sitting, standing, squatting, sleeping, stairs, reach over head, hygiene/grooming, and locomotion level  PARTICIPATION LIMITATIONS: meal prep, cleaning, laundry, community activity, and yard work  PERSONAL FACTORS Age, Behavior pattern, Past/current experiences, and 1-2 comorbidities: hx of neck and back surgery, HTN, chronic kidney disease  are also affecting patient's functional outcome.   REHAB POTENTIAL: Fair    CLINICAL DECISION MAKING: Evolving/moderate  complexity  EVALUATION COMPLEXITY: Moderate   GOALS: Goals reviewed with patient? No  SHORT TERM GOALS: Target date: 05/27/22  Patient will be independent with initial HEP.  Goal status: met  2.  Patient will report pain levels at worst to be at least 5/10  Baseline: 7/10 Goal status:ongoing    LONG TERM GOALS: Target date: 06/24/22  1.  Patient will demonstrate full pain free cervical ROM for safety with driving.  Goal status: on going  2.  Patient will demonstrate improved posture to decrease muscle imbalance. Goal status: ongoing  3.  Patient will report 4 on FOTO (patient outcome measure)  to demonstrate improved functional ability.  Baseline: 37, 43- 8/31 Goal status: IN PROGRESS  6.  Patient will increase BERG score by 10 points to demonstrate improved balance and decreased fall risk.   Baseline: 31/56, 36- 8/31 Goal status: IN PROGRESS    PLAN: PT FREQUENCY: 2x/week  PT DURATION: 8 weeks  PLANNED INTERVENTIONS: Therapeutic exercises, Therapeutic activity, Neuromuscular re-education, Balance training, Gait training, Patient/Family education, Self Care, Joint mobilization, Dry Needling, Electrical stimulation, Spinal mobilization, Cryotherapy, Moist heat, Taping, Vasopneumatic device, Traction, Ultrasound, Ionotophoresis 62m/ml Dexamethasone, Manual therapy, and Re-evaluation  PLAN FOR NEXT SESSION: work on strength, balance and treat spasms as needed  MLum Babe PT 06/11/2022, 3:46 PM

## 2022-06-16 ENCOUNTER — Ambulatory Visit: Payer: Medicare Other | Admitting: Physical Therapy

## 2022-06-18 ENCOUNTER — Encounter: Payer: Self-pay | Admitting: Physical Therapy

## 2022-06-18 ENCOUNTER — Ambulatory Visit: Payer: Medicare Other | Attending: Physical Medicine and Rehabilitation | Admitting: Physical Therapy

## 2022-06-18 DIAGNOSIS — R2681 Unsteadiness on feet: Secondary | ICD-10-CM

## 2022-06-18 DIAGNOSIS — G8929 Other chronic pain: Secondary | ICD-10-CM | POA: Diagnosis present

## 2022-06-18 DIAGNOSIS — R278 Other lack of coordination: Secondary | ICD-10-CM | POA: Diagnosis present

## 2022-06-18 DIAGNOSIS — R262 Difficulty in walking, not elsewhere classified: Secondary | ICD-10-CM

## 2022-06-18 DIAGNOSIS — M5442 Lumbago with sciatica, left side: Secondary | ICD-10-CM | POA: Diagnosis present

## 2022-06-18 DIAGNOSIS — M5412 Radiculopathy, cervical region: Secondary | ICD-10-CM

## 2022-06-18 DIAGNOSIS — M6281 Muscle weakness (generalized): Secondary | ICD-10-CM | POA: Diagnosis present

## 2022-06-18 NOTE — Therapy (Signed)
OUTPATIENT PHYSICAL THERAPY CERVICAL TREATMENT   Patient Name: Becky Hobbs MRN: [id number] DOB:June 24, 1946, 76 y.o., female Today's Date: 06/18/2022   PT End of Session - 06/18/22 1409     Visit Number 11    Date for PT Re-Evaluation 06/24/22    PT Start Time 1400    PT Stop Time 1445    PT Time Calculation (min) 45 min    Activity Tolerance Patient tolerated treatment well    Behavior During Therapy [facility] for tasks assessed/performed           Progress Note Reporting Period 04/22/22 to 05/30/22  See note below for Objective Data and Assessment of Progress/Goals.         Past Medical History:  Diagnosis Date   Anemia    Anxiety    panic attacks   Arthritis    Asthma    Blood transfusion    1973--with twins birth   Bronchitis    Chronic kidney disease    Diabetes mellitus    borderline   GERD (gastroesophageal reflux disease)    H/O hiatal hernia    surgery fixed   Headache    migraines   Heart murmur    MVP   Hepatitis    Hep C   Hypertension    Hypothyroidism    Pneumonia    Past Surgical History:  Procedure Laterality Date   ABDOMINAL HYSTERECTOMY     APPENDECTOMY     APPLICATION OF INTRAOPERATIVE CT SCAN N/A 06/05/2021   Procedure: APPLICATION OF INTRAOPERATIVE CT SCAN;  Surgeon: Dawley, Theodoro Doing, DO;  Location: [place];  Service: Neurosurgery;  Laterality: N/A;   BACK SURGERY     bladder tack     BREAST CYST EXCISION     BREAST CYST INCISION AND DRAINAGE     BREAST SURGERY     BUNIONECTOMY     CHOLECYSTECTOMY     DILATION AND CURETTAGE OF UTERUS     ESOPHAGOGASTRODUODENOSCOPY (EGD) WITH PROPOFOL N/A 01/03/2019   Procedure: ESOPHAGOGASTRODUODENOSCOPY (EGD) WITH PROPOFOL;  Surgeon: Otis Brace, MD;  Location: [place];  Service: Gastroenterology;  Laterality: N/A;   FOREIGN BODY REMOVAL  01/03/2019   Procedure: FOREIGN BODY REMOVAL;  Surgeon: Otis Brace, MD;  Location: [place] [facility];  Service: Gastroenterology;;   FRACTURE SURGERY      left wrist   HERNIA REPAIR     hiatal hernia   LUMBAR FUSION  07/26/2017   LUMBAR LAMINECTOMY  03/04/2012   Procedure: MICRODISCECTOMY LUMBAR LAMINECTOMY;  Surgeon: Jessy Oto, MD;  Location: [place];  Service: Orthopedics;  Laterality: N/A;  L3-4 central laminectomy   NASAL SEPTUM SURGERY     nissan fundoplication     OVARIAN CYST SURGERY     POSTERIOR CERVICAL FUSION/FORAMINOTOMY N/A 06/05/2021   Procedure: OCCIPITAL- CERVICAL TWO INSTRUMENTATION AND FUSION; EXTENSION TO CERVICAL FIVE;  Surgeon: Karsten Ro, DO;  Location: [place];  Service: Neurosurgery;  Laterality: N/A;   THORACIC FUSION  07/26/2017   TONSILLECTOMY     Patient Active Problem List   Diagnosis Date Noted   Neurogenic bladder 04/03/2022   Spondylosis, cervical, with myelopathy 04/03/2022   Myofascial pain dysfunction syndrome 12/17/2021   Hepatic cirrhosis ([place]) 06/26/2021   Fracture of C1 vertebra, closed ([place]) 06/09/2021   Closed C1 fracture ([place]) 06/05/2021   Plantar fasciitis of left foot 08/21/2018   Acute renal failure (ARF) ([place]) 02/09/2018   Chronic migraine without aura without status migrainosus, not intractable 06/16/2017   History of  lumbar fusion 08/03/2016    Class: Chronic   Migraine 04/09/2016   Acute-on-chronic kidney injury ([place]) 09/07/2015   Hypertension 09/07/2015   Hypothyroidism 09/07/2015   Gout 09/07/2015   Low back pain 09/07/2015   Obesity (BMI 30-39.9) 07/06/2013   Headache 04/12/2013   Concussion 04/12/2013   Lumbar spinal stenosis 03/04/2012    Class: Diagnosis of    PCP: Marton Redwood  REFERRING PROVIDER: Courtney Heys  REFERRING DIAG: M79.18, M54.2, Z98.890, M47.12  THERAPY DIAG:  Radiculopathy, cervical region  Difficulty in walking, not elsewhere classified  Muscle weakness (generalized)  Unsteadiness on feet  Other lack of coordination  Rationale for Evaluation and Treatment Rehabilitation  ONSET DATE: 04/03/22  SUBJECTIVE:                                                                                                                                                                                                          SUBJECTIVE STATEMENT: Patient reports that she has been doing a lot of cleaning her carpets, reports that she is tight ans sore and hurting a little more but pleased that she is not too bad  PERTINENT HISTORY:  Lumbar fusions and laminectomy, cervical fusion 2022  PAIN:  Are you having pain? Yes: NPRS scale: 4/10 Pain location: neck Pain description: achy, it just hurts Aggravating factors: bending, stretching, reaching overhead Relieving factors: pain med sometimes but I do not like taking them  PRECAUTIONS: Fall  WEIGHT BEARING RESTRICTIONS No  FALLS:  Has patient fallen in last 6 months? Yes. Number of falls 5 or 6   LIVING ENVIRONMENT: Lives with: lives alone Lives in: House/apartment Stairs: Yes: Internal: 14 steps; on left going up Has following equipment at home: Single point cane and Walker - 2 wheeled  OCCUPATION: retired  PLOF: Independent  PATIENT GOALS get my neck and back fixed, get my balance  OBJECTIVE:   DIAGNOSTIC FINDINGS:  FINDINGS: Single cross-table lateral portable spot fluoro image obtained of the upper cervical spine. Support catheters are seen overlying the hypopharynx.   IMPRESSION: Intraoperative localization during posterior cervical fusion.  PATIENT SURVEYS:  FOTO 37   COGNITION: Overall cognitive status: Within functional limits for tasks assessed   SENSATION: WFL  POSTURE: rounded shoulders, forward head, increased thoracic kyphosis, and flexed trunk   PALPATION: TTP and tightness along c-spine and UTs   CERVICAL ROM:   Active ROM A/PROM (deg) eval 06/11/22  Flexion 75% w/pain 50%   Extension Very limited due to pain Still very limited   Right lateral flexion Limited due to pain  limited  Left lateral flexion Limited due to pain Limited   Right  rotation 20d w/pain 20d   Left rotation 20d w/pain 20d    (Blank rows = not tested)  UPPER EXTREMITY ROM:  Active ROM Right eval Left eval Right 06/11/22 Left  06/11/22  Shoulder flexion 110 110 120 120  Shoulder extension      Shoulder abduction 90 90 110 110  Shoulder adduction      Shoulder extension      Shoulder internal rotation 55 50 WFL WFL  Shoulder external rotation C7 C7 T2 T2  Elbow flexion      Elbow extension      Wrist flexion      Wrist extension      Wrist ulnar deviation      Wrist radial deviation      Wrist pronation      Wrist supination       (Blank rows = not tested)  UPPER EXTREMITY MMT:  grossly 4+/5 for all shoulder MMT and LE   FUNCTIONAL TESTS:  5 times sit to stand: 15.29s Timed up and go (TUG): 13.09s Berg Balance Scale: 31/56 tested on 04/28/22  VESTIBULAR TESTING Smooth pursuits and saccades are normal, no nystagmus   TODAY'S TREATMENT:  06/18/22 UBE level 3 x 6 minutes Volley ball High step toe touches,then on airex toe touches Side stepping on airex balance beam, tandem on the beam Marches on the minitramp 20# lats 5# shoulder extension STM to the upper traps Figure 8's, box walk fwd/side/back  06/11/22 FOTO Shoulder and cervical ROM  5 times sit to stand: 12.43s Timed up and go (TUG): 11.41s Berg Balance Scale: 36/56  UBE L2 x 12mns  Seated row 20# 2x10 Lat pull downs 20# 2x10 Shoulder ext 5# 2x10   06/09/22 UBE L2 x 3 min each Seated rows 20lb 2x10 Lat pull downs 2x10 Leg press 30lb 2x10 Shoulder Ext 5lb 2x10  Leg curls 20lb 2x10  Leg Ext 10lb 2x10 Shoulder ER red 2x10 Side step in // on balance beam Side step on an doff airex   06/05/22 NuStep L5 x 6 min  Seated Rows 15lb 2x10 Lat pull 15lb 2x10 Leg press 2x10 20lb  Leg curls 20lb 2x10 Leg Ext 5lb 2x10 Alt 4 in box taps 2x5  STM UT and neck  05/21/22 UBE level 3.2 x 6 minutes Seated row 10# 2x10 Lats 15# 2x10 Leg press 20# 2x10 Leg curls 15# 2x10 Leg  extension 5# 2x10 Balance ball toss, cone toe touches, on airex reaching STM to the neck Side stepping over cane  05/19/22 Bike Level 3 x 6 minutes Seated row 10# 2x10 Lats 15# 2x10 Leg press 20# 2x10 Feet on ball K2C, trunk rotation, small bridges and isometric abs Clamshells hooklying green tband STM to the upper traps, neck and rhomboids  05/14/22 Recumbent bike L3 x 6 minutes Sit to stand with 2# ball in BUE, chest press at the top, 10 reps. Standing Hip strength facing wall with 2# weight, 5 reps each in abd, diagonally back, hip ext. B Heel raises on bar 10 reps Alternating taps on cones, increased in challenge with rotation and tapping multiple cones, occ min a Crossover step with weight shift, then return to start, min a for balance.   PATIENT EDUCATION:  Education details: Reviewed previous HEP Person educated: Patient Education method: Explanation Education comprehension: verbalized understanding   HOME EXERCISE PROGRAM: [id number]  ASSESSMENT:  CLINICAL IMPRESSION: Patient doing well, she is in  a little more pain due to her cleaning some carpets.  But she reports that she is pleased that she was able to do it and not have too much pain, she is hurting a little more with this, we also worked on her balance, tends to really have difficulty going to the right  OBJECTIVE IMPAIRMENTS Abnormal gait, decreased balance, decreased coordination, decreased ROM, decreased strength, decreased safety awareness, hypomobility, impaired flexibility, improper body mechanics, and pain.   ACTIVITY LIMITATIONS carrying, lifting, bending, sitting, standing, squatting, sleeping, stairs, reach over head, hygiene/grooming, and locomotion level  PARTICIPATION LIMITATIONS: meal prep, cleaning, laundry, community activity, and yard work  PERSONAL FACTORS Age, Behavior pattern, Past/current experiences, and 1-2 comorbidities: hx of neck and back surgery, HTN, chronic kidney disease  are also  affecting patient's functional outcome.   REHAB POTENTIAL: Fair    CLINICAL DECISION MAKING: Evolving/moderate complexity  EVALUATION COMPLEXITY: Moderate   GOALS: Goals reviewed with patient? No  SHORT TERM GOALS: Target date: 05/27/22  Patient will be independent with initial HEP.  Goal status: met  2.  Patient will report pain levels at worst to be at least 5/10  Baseline: 7/10 Goal status:ongoing    LONG TERM GOALS: Target date: 06/24/22  1.  Patient will demonstrate full pain free cervical ROM for safety with driving.  Goal status: on going  2.  Patient will demonstrate improved posture to decrease muscle imbalance. Goal status: ongoing  3.  Patient will report 56 on FOTO (patient outcome measure)  to demonstrate improved functional ability.  Baseline: 37, 43- 8/31 Goal status: IN PROGRESS  6.  Patient will increase BERG score by 10 points to demonstrate improved balance and decreased fall risk.   Baseline: 31/56, 36- 8/31 Goal status: IN PROGRESS    PLAN: PT FREQUENCY: 2x/week  PT DURATION: 8 weeks  PLANNED INTERVENTIONS: Therapeutic exercises, Therapeutic activity, Neuromuscular re-education, Balance training, Gait training, Patient/Family education, Self Care, Joint mobilization, Dry Needling, Electrical stimulation, Spinal mobilization, Cryotherapy, Moist heat, Taping, Vasopneumatic device, Traction, Ultrasound, Ionotophoresis 77m/ml Dexamethasone, Manual therapy, and Re-evaluation  PLAN FOR NEXT SESSION: work on strength, balance and treat spasms as needed  MLum Babe PT 06/18/2022, 2:10 PM

## 2022-06-23 ENCOUNTER — Encounter: Payer: Self-pay | Admitting: Physical Therapy

## 2022-06-23 ENCOUNTER — Ambulatory Visit: Payer: Medicare Other | Admitting: Physical Therapy

## 2022-06-23 DIAGNOSIS — M6281 Muscle weakness (generalized): Secondary | ICD-10-CM

## 2022-06-23 DIAGNOSIS — M5412 Radiculopathy, cervical region: Secondary | ICD-10-CM

## 2022-06-23 DIAGNOSIS — R262 Difficulty in walking, not elsewhere classified: Secondary | ICD-10-CM

## 2022-06-23 DIAGNOSIS — R2681 Unsteadiness on feet: Secondary | ICD-10-CM

## 2022-06-23 NOTE — Therapy (Signed)
OUTPATIENT PHYSICAL THERAPY CERVICAL TREATMENT   Patient Name: Becky Hobbs MRN: [id number] DOB:05/17/1946, 76 y.o., female Today's Date: 06/23/2022   PT End of Session - 06/23/22 1600     Visit Number 12    Date for PT Re-Evaluation 06/24/22    PT Start Time 1600    PT Stop Time 1633    PT Time Calculation (min) 33 min    Activity Tolerance Patient tolerated treatment well    Behavior During Therapy WFL for tasks assessed/performed                 Past Medical History:  Diagnosis Date   Anemia    Anxiety    panic attacks   Arthritis    Asthma    Blood transfusion    1973--with twins birth   Bronchitis    Chronic kidney disease    Diabetes mellitus    borderline   GERD (gastroesophageal reflux disease)    H/O hiatal hernia    surgery fixed   Headache    migraines   Heart murmur    MVP   Hepatitis    Hep C   Hypertension    Hypothyroidism    Pneumonia    Past Surgical History:  Procedure Laterality Date   ABDOMINAL HYSTERECTOMY     APPENDECTOMY     APPLICATION OF INTRAOPERATIVE CT SCAN N/A 06/05/2021   Procedure: APPLICATION OF INTRAOPERATIVE CT SCAN;  Surgeon: Karsten Ro, DO;  Location: [place];  Service: Neurosurgery;  Laterality: N/A;   BACK SURGERY     bladder tack     BREAST CYST EXCISION     BREAST CYST INCISION AND DRAINAGE     BREAST SURGERY     BUNIONECTOMY     CHOLECYSTECTOMY     DILATION AND CURETTAGE OF UTERUS     ESOPHAGOGASTRODUODENOSCOPY (EGD) WITH PROPOFOL N/A 01/03/2019   Procedure: ESOPHAGOGASTRODUODENOSCOPY (EGD) WITH PROPOFOL;  Surgeon: Otis Brace, MD;  Location: [place];  Service: Gastroenterology;  Laterality: N/A;   FOREIGN BODY REMOVAL  01/03/2019   Procedure: FOREIGN BODY REMOVAL;  Surgeon: Otis Brace, MD;  Location: [place] [facility];  Service: Gastroenterology;;   FRACTURE SURGERY     left wrist   HERNIA REPAIR     hiatal hernia   LUMBAR FUSION  07/26/2017   LUMBAR LAMINECTOMY  03/04/2012    Procedure: MICRODISCECTOMY LUMBAR LAMINECTOMY;  Surgeon: Jessy Oto, MD;  Location: [place];  Service: Orthopedics;  Laterality: N/A;  L3-4 central laminectomy   NASAL SEPTUM SURGERY     nissan fundoplication     OVARIAN CYST SURGERY     POSTERIOR CERVICAL FUSION/FORAMINOTOMY N/A 06/05/2021   Procedure: OCCIPITAL- CERVICAL TWO INSTRUMENTATION AND FUSION; EXTENSION TO CERVICAL FIVE;  Surgeon: Karsten Ro, DO;  Location: [place];  Service: Neurosurgery;  Laterality: N/A;   THORACIC FUSION  07/26/2017   TONSILLECTOMY     Patient Active Problem List   Diagnosis Date Noted   Neurogenic bladder 04/03/2022   Spondylosis, cervical, with myelopathy 04/03/2022   Myofascial pain dysfunction syndrome 12/17/2021   Hepatic cirrhosis ([place]) 06/26/2021   Fracture of C1 vertebra, closed ([place]) 06/09/2021   Closed C1 fracture ([place]) 06/05/2021   Plantar fasciitis of left foot 08/21/2018   Acute renal failure (ARF) ([place]) 02/09/2018   Chronic migraine without aura without status migrainosus, not intractable 06/16/2017   History of lumbar fusion 08/03/2016    Class: Chronic   Migraine 04/09/2016   Acute-on-chronic kidney injury ([place]) 09/07/2015  Hypertension 09/07/2015   Hypothyroidism 09/07/2015   Gout 09/07/2015   Low back pain 09/07/2015   Obesity (BMI 30-39.9) 07/06/2013   Headache 04/12/2013   Concussion 04/12/2013   Lumbar spinal stenosis 03/04/2012    Class: Diagnosis of    PCP: Marton Redwood  REFERRING PROVIDER: Courtney Heys  REFERRING DIAG: M79.18, M54.2, Z98.890, M47.12  THERAPY DIAG:  Radiculopathy, cervical region  Difficulty in walking, not elsewhere classified  Muscle weakness (generalized)  Unsteadiness on feet  Rationale for Evaluation and Treatment Rehabilitation  ONSET DATE: 04/03/22  SUBJECTIVE:                                                                                                                                                                                                          SUBJECTIVE STATEMENT: "Not too bad" Took some OTC pain meds   PERTINENT HISTORY:  Lumbar fusions and laminectomy, cervical fusion 2022  PAIN:  Are you having pain? Yes: NPRS scale: 2/10 Pain location: neck Pain description: achy, it just hurts Aggravating factors: bending, stretching, reaching overhead Relieving factors: pain med sometimes but I do not like taking them  PRECAUTIONS: Fall  WEIGHT BEARING RESTRICTIONS No  FALLS:  Has patient fallen in last 6 months? Yes. Number of falls 5 or 6   LIVING ENVIRONMENT: Lives with: lives alone Lives in: House/apartment Stairs: Yes: Internal: 14 steps; on left going up Has following equipment at home: Single point cane and Walker - 2 wheeled  OCCUPATION: retired  PLOF: Independent  PATIENT GOALS get my neck and back fixed, get my balance  OBJECTIVE:   DIAGNOSTIC FINDINGS:  FINDINGS: Single cross-table lateral portable spot fluoro image obtained of the upper cervical spine. Support catheters are seen overlying the hypopharynx.   IMPRESSION: Intraoperative localization during posterior cervical fusion.  PATIENT SURVEYS:  FOTO 37   COGNITION: Overall cognitive status: Within functional limits for tasks assessed   SENSATION: WFL  POSTURE: rounded shoulders, forward head, increased thoracic kyphosis, and flexed trunk   PALPATION: TTP and tightness along c-spine and UTs   CERVICAL ROM:   Active ROM A/PROM (deg) eval 06/11/22  Flexion 75% w/pain 50%   Extension Very limited due to pain Still very limited   Right lateral flexion Limited due to pain limited  Left lateral flexion Limited due to pain Limited   Right rotation 20d w/pain 20d   Left rotation 20d w/pain 20d    (Blank rows = not tested)  UPPER EXTREMITY ROM:  Active ROM Right eval Left eval Right 06/11/22 Left  06/11/22  Shoulder  flexion 110 110 120 120  Shoulder extension      Shoulder abduction 90 90 110 110  Shoulder  adduction      Shoulder extension      Shoulder internal rotation 55 50 WFL WFL  Shoulder external rotation C7 C7 T2 T2  Elbow flexion      Elbow extension      Wrist flexion      Wrist extension      Wrist ulnar deviation      Wrist radial deviation      Wrist pronation      Wrist supination       (Blank rows = not tested)  UPPER EXTREMITY MMT:  grossly 4+/5 for all shoulder MMT and LE   FUNCTIONAL TESTS:  5 times sit to stand: 15.29s Timed up and go (TUG): 13.09s Berg Balance Scale: 31/56 tested on 04/28/22  VESTIBULAR TESTING Smooth pursuits and saccades are normal, no nystagmus   TODAY'S TREATMENT:  06/22/22 UBE L2 x 3 min each Leg press 40lb 2x12  Volley ball Side stepping on airex balance beam, tandem on the beam Shoulder Ext 5lb 2x10   Standing rows 10lb 2x10   06/18/22 UBE level 3 x 6 minutes Volley ball High step toe touches,then on airex toe touches Side stepping on airex balance beam, tandem on the beam Marches on the minitramp 20# lats 5# shoulder extension STM to the upper traps Figure 8's, box walk fwd/side/back  06/11/22 FOTO Shoulder and cervical ROM  5 times sit to stand: 12.43s Timed up and go (TUG): 11.41s Berg Balance Scale: 36/56  UBE L2 x 32mns  Seated row 20# 2x10 Lat pull downs 20# 2x10 Shoulder ext 5# 2x10   06/09/22 UBE L2 x 3 min each Seated rows 20lb 2x10 Lat pull downs 2x10 Leg press 30lb 2x10 Shoulder Ext 5lb 2x10  Leg curls 20lb 2x10  Leg Ext 10lb 2x10 Shoulder ER red 2x10 Side step in // on balance beam Side step on an doff airex   06/05/22 NuStep L5 x 6 min  Seated Rows 15lb 2x10 Lat pull 15lb 2x10 Leg press 2x10 20lb  Leg curls 20lb 2x10 Leg Ext 5lb 2x10 Alt 4 in box taps 2x5  STM UT and neck   PATIENT EDUCATION:  Education details: Reviewed previous HEP Person educated: Patient Education method: Explanation Education comprehension: verbalized understanding   HOME EXERCISE  PROGRAM: [id number]  ASSESSMENT:  CLINICAL IMPRESSION: Patient doing well, Pt requested a shorted  session to go check on her sister. She did well completing interventions. Some fatigue and eave breathing noted with volleyball. UE use required at times to maintain balance in // bars with side steps and tandem walking. Some postural weakness present with standing rows and extensions.  OBJECTIVE IMPAIRMENTS Abnormal gait, decreased balance, decreased coordination, decreased ROM, decreased strength, decreased safety awareness, hypomobility, impaired flexibility, improper body mechanics, and pain.   ACTIVITY LIMITATIONS carrying, lifting, bending, sitting, standing, squatting, sleeping, stairs, reach over head, hygiene/grooming, and locomotion level  PARTICIPATION LIMITATIONS: meal prep, cleaning, laundry, community activity, and yard work  PERSONAL FACTORS Age, Behavior pattern, Past/current experiences, and 1-2 comorbidities: hx of neck and back surgery, HTN, chronic kidney disease  are also affecting patient's functional outcome.   REHAB POTENTIAL: Fair    CLINICAL DECISION MAKING: Evolving/moderate complexity  EVALUATION COMPLEXITY: Moderate   GOALS: Goals reviewed with patient? No  SHORT TERM GOALS: Target date: 05/27/22  Patient will be independent with initial HEP.  Goal status: met  2.  Patient will report pain levels at worst to be at least 5/10  Baseline: 7/10 Goal status:ongoing    LONG TERM GOALS: Target date: 06/24/22  1.  Patient will demonstrate full pain free cervical ROM for safety with driving.  Goal status: on going  2.  Patient will demonstrate improved posture to decrease muscle imbalance. Goal status: ongoing  3.  Patient will report 28 on FOTO (patient outcome measure)  to demonstrate improved functional ability.  Baseline: 37, 43- 8/31 Goal status: IN PROGRESS  6.  Patient will increase BERG score by 10 points to demonstrate improved balance and  decreased fall risk.   Baseline: 31/56, 36- 8/31 Goal status: IN PROGRESS    PLAN: PT FREQUENCY: 2x/week  PT DURATION: 8 weeks  PLANNED INTERVENTIONS: Therapeutic exercises, Therapeutic activity, Neuromuscular re-education, Balance training, Gait training, Patient/Family education, Self Care, Joint mobilization, Dry Needling, Electrical stimulation, Spinal mobilization, Cryotherapy, Moist heat, Taping, Vasopneumatic device, Traction, Ultrasound, Ionotophoresis 71m/ml Dexamethasone, Manual therapy, and Re-evaluation  PLAN FOR NEXT SESSION: work on strength, balance and treat spasms as needed  MLum Babe PT 06/23/2022, 4:37 PM

## 2022-06-25 ENCOUNTER — Ambulatory Visit: Payer: Medicare Other | Admitting: Physical Therapy

## 2022-06-25 ENCOUNTER — Encounter: Payer: Self-pay | Admitting: Physical Therapy

## 2022-06-25 DIAGNOSIS — M6281 Muscle weakness (generalized): Secondary | ICD-10-CM

## 2022-06-25 DIAGNOSIS — M5412 Radiculopathy, cervical region: Secondary | ICD-10-CM

## 2022-06-25 DIAGNOSIS — R262 Difficulty in walking, not elsewhere classified: Secondary | ICD-10-CM

## 2022-06-25 NOTE — Therapy (Unsigned)
OUTPATIENT PHYSICAL THERAPY CERVICAL TREATMENT   Patient Name: Becky Hobbs MRN: [id number] DOB:1946/03/08, 76 y.o., female Today's Date: 06/25/2022   PT End of Session - 06/25/22 1525     Visit Number 13    Date for PT Re-Evaluation 06/24/22    PT Start Time 1515    PT Stop Time 1600    PT Time Calculation (min) 45 min    Activity Tolerance Patient tolerated treatment well    Behavior During Therapy WFL for tasks assessed/performed                 Past Medical History:  Diagnosis Date   Anemia    Anxiety    panic attacks   Arthritis    Asthma    Blood transfusion    1973--with twins birth   Bronchitis    Chronic kidney disease    Diabetes mellitus    borderline   GERD (gastroesophageal reflux disease)    H/O hiatal hernia    surgery fixed   Headache    migraines   Heart murmur    MVP   Hepatitis    Hep C   Hypertension    Hypothyroidism    Pneumonia    Past Surgical History:  Procedure Laterality Date   ABDOMINAL HYSTERECTOMY     APPENDECTOMY     APPLICATION OF INTRAOPERATIVE CT SCAN N/A 06/05/2021   Procedure: APPLICATION OF INTRAOPERATIVE CT SCAN;  Surgeon: Karsten Ro, DO;  Location: [place];  Service: Neurosurgery;  Laterality: N/A;   BACK SURGERY     bladder tack     BREAST CYST EXCISION     BREAST CYST INCISION AND DRAINAGE     BREAST SURGERY     BUNIONECTOMY     CHOLECYSTECTOMY     DILATION AND CURETTAGE OF UTERUS     ESOPHAGOGASTRODUODENOSCOPY (EGD) WITH PROPOFOL N/A 01/03/2019   Procedure: ESOPHAGOGASTRODUODENOSCOPY (EGD) WITH PROPOFOL;  Surgeon: Otis Brace, MD;  Location: [place];  Service: Gastroenterology;  Laterality: N/A;   FOREIGN BODY REMOVAL  01/03/2019   Procedure: FOREIGN BODY REMOVAL;  Surgeon: Otis Brace, MD;  Location: [place] [facility];  Service: Gastroenterology;;   FRACTURE SURGERY     left wrist   HERNIA REPAIR     hiatal hernia   LUMBAR FUSION  07/26/2017   LUMBAR LAMINECTOMY  03/04/2012    Procedure: MICRODISCECTOMY LUMBAR LAMINECTOMY;  Surgeon: Jessy Oto, MD;  Location: [place];  Service: Orthopedics;  Laterality: N/A;  L3-4 central laminectomy   NASAL SEPTUM SURGERY     nissan fundoplication     OVARIAN CYST SURGERY     POSTERIOR CERVICAL FUSION/FORAMINOTOMY N/A 06/05/2021   Procedure: OCCIPITAL- CERVICAL TWO INSTRUMENTATION AND FUSION; EXTENSION TO CERVICAL FIVE;  Surgeon: Karsten Ro, DO;  Location: [place];  Service: Neurosurgery;  Laterality: N/A;   THORACIC FUSION  07/26/2017   TONSILLECTOMY     Patient Active Problem List   Diagnosis Date Noted   Neurogenic bladder 04/03/2022   Spondylosis, cervical, with myelopathy 04/03/2022   Myofascial pain dysfunction syndrome 12/17/2021   Hepatic cirrhosis ([place]) 06/26/2021   Fracture of C1 vertebra, closed ([place]) 06/09/2021   Closed C1 fracture ([place]) 06/05/2021   Plantar fasciitis of left foot 08/21/2018   Acute renal failure (ARF) ([place]) 02/09/2018   Chronic migraine without aura without status migrainosus, not intractable 06/16/2017   History of lumbar fusion 08/03/2016    Class: Chronic   Migraine 04/09/2016   Acute-on-chronic kidney injury ([place]) 09/07/2015  Hypertension 09/07/2015   Hypothyroidism 09/07/2015   Gout 09/07/2015   Low back pain 09/07/2015   Obesity (BMI 30-39.9) 07/06/2013   Headache 04/12/2013   Concussion 04/12/2013   Lumbar spinal stenosis 03/04/2012    Class: Diagnosis of    PCP: Marton Redwood  REFERRING PROVIDER: Courtney Heys  REFERRING DIAG: M79.18, M54.2, Z98.890, M47.12  THERAPY DIAG:  Difficulty in walking, not elsewhere classified  Muscle weakness (generalized)  Radiculopathy, cervical region  Rationale for Evaluation and Treatment Rehabilitation  ONSET DATE: 04/03/22  SUBJECTIVE:                                                                                                                                                                                                          SUBJECTIVE STATEMENT: "Pretty good"  PERTINENT HISTORY:  Lumbar fusions and laminectomy, cervical fusion 2022  PAIN:  Are you having pain? Yes: NPRS scale: 4/10 Pain location: neck Pain description: achy, it just hurts Aggravating factors: bending, stretching, reaching overhead Relieving factors: pain med sometimes but I do not like taking them  PRECAUTIONS: Fall  WEIGHT BEARING RESTRICTIONS No  FALLS:  Has patient fallen in last 6 months? Yes. Number of falls 5 or 6   LIVING ENVIRONMENT: Lives with: lives alone Lives in: House/apartment Stairs: Yes: Internal: 14 steps; on left going up Has following equipment at home: Single point cane and Walker - 2 wheeled  OCCUPATION: retired  PLOF: Independent  PATIENT GOALS get my neck and back fixed, get my balance  OBJECTIVE:   DIAGNOSTIC FINDINGS:  FINDINGS: Single cross-table lateral portable spot fluoro image obtained of the upper cervical spine. Support catheters are seen overlying the hypopharynx.   IMPRESSION: Intraoperative localization during posterior cervical fusion.  PATIENT SURVEYS:  FOTO 37   COGNITION: Overall cognitive status: Within functional limits for tasks assessed   SENSATION: WFL  POSTURE: rounded shoulders, forward head, increased thoracic kyphosis, and flexed trunk   PALPATION: TTP and tightness along c-spine and UTs   CERVICAL ROM:   Active ROM A/PROM (deg) eval 06/11/22 06/25/22  Flexion 75% w/pain 50%  Decrease 25%  Extension Very limited due to pain Still very limited  75% limited  Right lateral flexion Limited due to pain limited 75% limited  Left lateral flexion Limited due to pain Limited  75% limited  Right rotation 20d w/pain 20d  20deg  Left rotation 20d w/pain 20d  20deg   (Blank rows = not tested)  UPPER EXTREMITY ROM:  Active ROM Right eval Left eval Right 06/11/22 Left  06/11/22  Shoulder  flexion 110 110 120 120  Shoulder extension      Shoulder abduction 90  90 110 110  Shoulder adduction      Shoulder extension      Shoulder internal rotation 55 50 WFL WFL  Shoulder external rotation C7 C7 T2 T2  Elbow flexion      Elbow extension      Wrist flexion      Wrist extension      Wrist ulnar deviation      Wrist radial deviation      Wrist pronation      Wrist supination       (Blank rows = not tested)  UPPER EXTREMITY MMT:  grossly 4+/5 for all shoulder MMT and LE   FUNCTIONAL TESTS:  5 times sit to stand: 15.29s Timed up and go (TUG): 13.09s Berg Balance Scale: 31/56 tested on 04/28/22  VESTIBULAR TESTING Smooth pursuits and saccades are normal, no nystagmus   TODAY'S TREATMENT:  06/25/22 UBE L2 x3 min each Leg press 40lb 2x10 Shoulder Ext 5lb 2x10 Standing rows 10lb 2x10   06/22/22 UBE L2 x 3 min each Leg press 40lb 2x12  Volley ball Side stepping on airex balance beam, tandem on the beam Shoulder Ext 5lb 2x10   Standing rows 10lb 2x10   06/18/22 UBE level 3 x 6 minutes Volley ball High step toe touches,then on airex toe touches Side stepping on airex balance beam, tandem on the beam Marches on the minitramp 20# lats 5# shoulder extension STM to the upper traps Figure 8's, box walk fwd/side/back  06/11/22 FOTO Shoulder and cervical ROM  5 times sit to stand: 12.43s Timed up and go (TUG): 11.41s Berg Balance Scale: 36/56  UBE L2 x 42mins  Seated row 20# 2x10 Lat pull downs 20# 2x10 Shoulder ext 5# 2x10   PATIENT EDUCATION:  Education details: Reviewed previous HEP Person educated: Patient Education method: Explanation Education comprehension: verbalized understanding   HOME EXERCISE PROGRAM: [id number]  ASSESSMENT:  CLINICAL IMPRESSION: Patient doing well but reports some increase fatigue. She has progressed towards LTG increasing hr BERG balance test. Some postural weakness present with standing rows and extensions. Fatigue noted throughout session.  OBJECTIVE IMPAIRMENTS Abnormal gait, decreased  balance, decreased coordination, decreased ROM, decreased strength, decreased safety awareness, hypomobility, impaired flexibility, improper body mechanics, and pain.   ACTIVITY LIMITATIONS carrying, lifting, bending, sitting, standing, squatting, sleeping, stairs, reach over head, hygiene/grooming, and locomotion level  PARTICIPATION LIMITATIONS: meal prep, cleaning, laundry, community activity, and yard work  PERSONAL FACTORS Age, Behavior pattern, Past/current experiences, and 1-2 comorbidities: hx of neck and back surgery, HTN, chronic kidney disease  are also affecting patient's functional outcome.   REHAB POTENTIAL: Fair    CLINICAL DECISION MAKING: Evolving/moderate complexity  EVALUATION COMPLEXITY: Moderate   GOALS: Goals reviewed with patient? No  SHORT TERM GOALS: Target date: 05/27/22  Patient will be independent with initial HEP.  Goal status: met  2.  Patient will report pain levels at worst to be at least 5/10  Baseline: 7/10 Goal status:Met    LONG TERM GOALS: Target date: 06/24/22  1.  Patient will demonstrate full pain free cervical ROM for safety with driving.  Goal status: on going  2.  Patient will demonstrate improved posture to decrease muscle imbalance. Goal status: Progressing  3.  Patient will report 23 on FOTO (patient outcome measure)  to demonstrate improved functional ability.  Baseline: 37, 43- 8/31 Goal status: IN PROGRESS   6.  Patient will  increase BERG score by 10 points to demonstrate improved balance and decreased fall risk.   Baseline: 31/56, 36- 8/31 Goal status: IN PROGRESS 45/56 06/25/22   PLAN: PT FREQUENCY: 2x/week  PT DURATION: 8 weeks  PLANNED INTERVENTIONS: Therapeutic exercises, Therapeutic activity, Neuromuscular re-education, Balance training, Gait training, Patient/Family education, Self Care, Joint mobilization, Dry Needling, Electrical stimulation, Spinal mobilization, Cryotherapy, Moist heat, Taping, Vasopneumatic  device, Traction, Ultrasound, Ionotophoresis 80m/ml Dexamethasone, Manual therapy, and Re-evaluation  PLAN FOR NEXT SESSION: work on strength, balance and treat spasms as needed  MLum Babe PT 06/25/2022, 3:26 PM

## 2022-06-30 ENCOUNTER — Ambulatory Visit: Payer: Medicare Other

## 2022-06-30 DIAGNOSIS — M6281 Muscle weakness (generalized): Secondary | ICD-10-CM

## 2022-06-30 DIAGNOSIS — R262 Difficulty in walking, not elsewhere classified: Secondary | ICD-10-CM

## 2022-06-30 DIAGNOSIS — R278 Other lack of coordination: Secondary | ICD-10-CM

## 2022-06-30 DIAGNOSIS — M5412 Radiculopathy, cervical region: Secondary | ICD-10-CM

## 2022-06-30 DIAGNOSIS — R2681 Unsteadiness on feet: Secondary | ICD-10-CM

## 2022-06-30 NOTE — Therapy (Signed)
OUTPATIENT PHYSICAL THERAPY CERVICAL TREATMENT   Patient Name: Becky Hobbs MRN: [id number] DOB:31-Aug-1946, 76 y.o., female Today's Date: 06/30/2022   PT End of Session - 06/30/22 1545     Visit Number 14    Date for PT Re-Evaluation 07/24/22    PT Start Time 1545    PT Stop Time 1627    PT Time Calculation (min) 42 min    Activity Tolerance Patient tolerated treatment well    Behavior During Therapy WFL for tasks assessed/performed                  Past Medical History:  Diagnosis Date   Anemia    Anxiety    panic attacks   Arthritis    Asthma    Blood transfusion    1973--with twins birth   Bronchitis    Chronic kidney disease    Diabetes mellitus    borderline   GERD (gastroesophageal reflux disease)    H/O hiatal hernia    surgery fixed   Headache    migraines   Heart murmur    MVP   Hepatitis    Hep C   Hypertension    Hypothyroidism    Pneumonia    Past Surgical History:  Procedure Laterality Date   ABDOMINAL HYSTERECTOMY     APPENDECTOMY     APPLICATION OF INTRAOPERATIVE CT SCAN N/A 06/05/2021   Procedure: APPLICATION OF INTRAOPERATIVE CT SCAN;  Surgeon: Karsten Ro, DO;  Location: [facility];  Service: Neurosurgery;  Laterality: N/A;   BACK SURGERY     bladder tack     BREAST CYST EXCISION     BREAST CYST INCISION AND DRAINAGE     BREAST SURGERY     BUNIONECTOMY     CHOLECYSTECTOMY     DILATION AND CURETTAGE OF UTERUS     ESOPHAGOGASTRODUODENOSCOPY (EGD) WITH PROPOFOL N/A 01/03/2019   Procedure: ESOPHAGOGASTRODUODENOSCOPY (EGD) WITH PROPOFOL;  Surgeon: Otis Brace, MD;  Location: [facility];  Service: Gastroenterology;  Laterality: N/A;   FOREIGN BODY REMOVAL  01/03/2019   Procedure: FOREIGN BODY REMOVAL;  Surgeon: Otis Brace, MD;  Location: [facility];  Service: Gastroenterology;;   FRACTURE SURGERY     left wrist   HERNIA REPAIR     hiatal hernia   LUMBAR FUSION  07/26/2017   LUMBAR LAMINECTOMY  03/04/2012    Procedure: MICRODISCECTOMY LUMBAR LAMINECTOMY;  Surgeon: Jessy Oto, MD;  Location: [facility];  Service: Orthopedics;  Laterality: N/A;  L3-4 central laminectomy   NASAL SEPTUM SURGERY     nissan fundoplication     OVARIAN CYST SURGERY     POSTERIOR CERVICAL FUSION/FORAMINOTOMY N/A 06/05/2021   Procedure: OCCIPITAL- CERVICAL TWO INSTRUMENTATION AND FUSION; EXTENSION TO CERVICAL FIVE;  Surgeon: Karsten Ro, DO;  Location: [facility];  Service: Neurosurgery;  Laterality: N/A;   THORACIC FUSION  07/26/2017   TONSILLECTOMY     Patient Active Problem List   Diagnosis Date Noted   Neurogenic bladder 04/03/2022   Spondylosis, cervical, with myelopathy 04/03/2022   Myofascial pain dysfunction syndrome 12/17/2021   Hepatic cirrhosis ([place]) 06/26/2021   Fracture of C1 vertebra, closed ([place]) 06/09/2021   Closed C1 fracture ([place]) 06/05/2021   Plantar fasciitis of left foot 08/21/2018   Acute renal failure (ARF) ([place]) 02/09/2018   Chronic migraine without aura without status migrainosus, not intractable 06/16/2017   History of lumbar fusion 08/03/2016    Class: Chronic   Migraine 04/09/2016   Acute-on-chronic kidney injury ([place]) 09/07/2015  Hypertension 09/07/2015   Hypothyroidism 09/07/2015   Gout 09/07/2015   Low back pain 09/07/2015   Obesity (BMI 30-39.9) 07/06/2013   Headache 04/12/2013   Concussion 04/12/2013   Lumbar spinal stenosis 03/04/2012    Class: Diagnosis of    PCP: Marton Redwood  REFERRING PROVIDER: Courtney Heys  REFERRING DIAG: M79.18, M54.2, Z98.890, M47.12  THERAPY DIAG:  Difficulty in walking, not elsewhere classified  Muscle weakness (generalized)  Radiculopathy, cervical region  Unsteadiness on feet  Other lack of coordination  Rationale for Evaluation and Treatment Rehabilitation  ONSET DATE: 04/03/22  SUBJECTIVE:                                                                                                                                                                                                          SUBJECTIVE STATEMENT: "Doing good, no falls or stumbles"   PERTINENT HISTORY:  Lumbar fusions and laminectomy, cervical fusion 2022  PAIN:  Are you having pain? Yes: NPRS scale: 4/10 Pain location: neck Pain description: achy, it just hurts Aggravating factors: bending, stretching, reaching overhead Relieving factors: pain med sometimes but I do not like taking them  PRECAUTIONS: Fall  WEIGHT BEARING RESTRICTIONS No  FALLS:  Has patient fallen in last 6 months? Yes. Number of falls 5 or 6   LIVING ENVIRONMENT: Lives with: lives alone Lives in: House/apartment Stairs: Yes: Internal: 14 steps; on left going up Has following equipment at home: Single point cane and Walker - 2 wheeled  OCCUPATION: retired  PLOF: Independent  PATIENT GOALS get my neck and back fixed, get my balance  OBJECTIVE:   DIAGNOSTIC FINDINGS:  FINDINGS: Single cross-table lateral portable spot fluoro image obtained of the upper cervical spine. Support catheters are seen overlying the hypopharynx.   IMPRESSION: Intraoperative localization during posterior cervical fusion.  PATIENT SURVEYS:  FOTO 37   COGNITION: Overall cognitive status: Within functional limits for tasks assessed   SENSATION: WFL  POSTURE: rounded shoulders, forward head, increased thoracic kyphosis, and flexed trunk   PALPATION: TTP and tightness along c-spine and UTs   CERVICAL ROM:   Active ROM A/PROM (deg) eval 06/11/22 06/25/22  Flexion 75% w/pain 50%  Decrease 25%  Extension Very limited due to pain Still very limited  75% limited  Right lateral flexion Limited due to pain limited 75% limited  Left lateral flexion Limited due to pain Limited  75% limited  Right rotation 20d w/pain 20d  20deg  Left rotation 20d w/pain 20d  20deg   (Blank rows = not tested)  UPPER EXTREMITY ROM:  Active ROM Right eval Left eval Right 06/11/22 Left  06/11/22  Shoulder  flexion 110 110 120 120  Shoulder extension      Shoulder abduction 90 90 110 110  Shoulder adduction      Shoulder extension      Shoulder internal rotation 55 50 WFL WFL  Shoulder external rotation C7 C7 T2 T2  Elbow flexion      Elbow extension      Wrist flexion      Wrist extension      Wrist ulnar deviation      Wrist radial deviation      Wrist pronation      Wrist supination       (Blank rows = not tested)  UPPER EXTREMITY MMT:  grossly 4+/5 for all shoulder MMT and LE   FUNCTIONAL TESTS:  5 times sit to stand: 15.29s Timed up and go (TUG): 13.09s Berg Balance Scale: 31/56 tested on 04/28/22  VESTIBULAR TESTING Smooth pursuits and saccades are normal, no nystagmus   TODAY'S TREATMENT:  06/30/22 Nustep L5 x6mns  UBE L2 x667ms  Shoulder ext 5# 2x10 Cable rows 10# 2x10 Walking on beam tandem and side steps Leg ext 10# 2x10 HS curls 20# 2x10    06/25/22 UBE L2 x3 min each Leg press 40lb 2x10 Shoulder Ext 5lb 2x10 Standing rows 10lb 2x10   06/22/22 UBE L2 x 3 min each Leg press 40lb 2x12  Volley ball Side stepping on airex balance beam, tandem on the beam Shoulder Ext 5lb 2x10   Standing rows 10lb 2x10   06/18/22 UBE level 3 x 6 minutes Volley ball High step toe touches,then on airex toe touches Side stepping on airex balance beam, tandem on the beam Marches on the minitramp 20# lats 5# shoulder extension STM to the upper traps Figure 8's, box walk fwd/side/back  06/11/22 FOTO Shoulder and cervical ROM  5 times sit to stand: 12.43s Timed up and go (TUG): 11.41s Berg Balance Scale: 36/56  UBE L2 x 7m69m  Seated row 20# 2x10 Lat pull downs 20# 2x10 Shoulder ext 5# 2x10   PATIENT EDUCATION:  Education details: Reviewed previous HEP Person educated: Patient Education method: Explanation Education comprehension: verbalized understanding   HOME EXERCISE PROGRAM: [email or web address]  ASSESSMENT:  CLINICAL IMPRESSION: Patient doing well not having pain  but still presents with lots of stiffness in cervical spine. Some postural weakness present with standing rows and extensions. Fatigue throughout session. Worked on strength and balance for UE and LE. States that walking on the beam was "the best she's ever done."   OBJECTIVE IMPAIRMENTS Abnormal gait, decreased balance, decreased coordination, decreased ROM, decreased strength, decreased safety awareness, hypomobility, impaired flexibility, improper body mechanics, and pain.   ACTIVITY LIMITATIONS carrying, lifting, bending, sitting, standing, squatting, sleeping, stairs, reach over head, hygiene/grooming, and locomotion level  PARTICIPATION LIMITATIONS: meal prep, cleaning, laundry, community activity, and yard work  PERSONAL FACTORS Age, Behavior pattern, Past/current experiences, and 1-2 comorbidities: hx of neck and back surgery, HTN, chronic kidney disease  are also affecting patient's functional outcome.   REHAB POTENTIAL: Fair    CLINICAL DECISION MAKING: Evolving/moderate complexity  EVALUATION COMPLEXITY: Moderate   GOALS: Goals reviewed with patient? No  SHORT TERM GOALS: Target date: 05/27/22  Patient will be independent with initial HEP.  Goal status: met  2.  Patient will report pain levels at worst to be at least 5/10  Baseline: 7/10 Goal status:Met    LONG TERM GOALS: Target date: 06/24/22  1.  Patient will demonstrate full pain free cervical ROM for safety with driving.  Goal status: on going  2.  Patient will demonstrate improved posture to decrease muscle imbalance. Goal status: Progressing  3.  Patient will report 48 on FOTO (patient outcome measure)  to demonstrate improved functional ability.  Baseline: 37, 43- 8/31 Goal status: IN PROGRESS   6.  Patient will increase BERG score by 10 points to demonstrate improved balance and decreased fall risk.   Baseline: 31/56, 36- 8/31 Goal status: IN PROGRESS 45/56 06/25/22   PLAN: PT FREQUENCY: 2x/week  PT  DURATION: 8 weeks  PLANNED INTERVENTIONS: Therapeutic exercises, Therapeutic activity, Neuromuscular re-education, Balance training, Gait training, Patient/Family education, Self Care, Joint mobilization, Dry Needling, Electrical stimulation, Spinal mobilization, Cryotherapy, Moist heat, Taping, Vasopneumatic device, Traction, Ultrasound, Ionotophoresis 42m/ml Dexamethasone, Manual therapy, and Re-evaluation  PLAN FOR NEXT SESSION: work on strength, balance and treat spasms as needed, FFerol Luz DPT 06/30/2022, 4:28 PM

## 2022-07-01 NOTE — Therapy (Signed)
OUTPATIENT PHYSICAL THERAPY CERVICAL TREATMENT   Patient Name: Becky Hobbs MRN: [id number] DOB:[birth year], 76 y.o., female Today's Date: 07/02/2022   PT End of Session - 07/02/22 1146     Visit Number 15    Date for PT Re-Evaluation 07/24/22    PT Start Time 1145    PT Stop Time 29    PT Time Calculation (min) 45 min    Activity Tolerance Patient tolerated treatment well    Behavior During Therapy WFL for tasks assessed/performed                   Past Medical History:  Diagnosis Date   Anemia    Anxiety    panic attacks   Arthritis    Asthma    Blood transfusion    1973--with twins birth   Bronchitis    Chronic kidney disease    Diabetes mellitus    borderline   GERD (gastroesophageal reflux disease)    H/O hiatal hernia    surgery fixed   Headache    migraines   Heart murmur    MVP   Hepatitis    Hep C   Hypertension    Hypothyroidism    Pneumonia    Past Surgical History:  Procedure Laterality Date   ABDOMINAL HYSTERECTOMY     APPENDECTOMY     APPLICATION OF INTRAOPERATIVE CT SCAN N/A 06/05/2021   Procedure: APPLICATION OF INTRAOPERATIVE CT SCAN;  Surgeon: Karsten Ro, DO;  Location: [facility];  Service: Neurosurgery;  Laterality: N/A;   BACK SURGERY     bladder tack     BREAST CYST EXCISION     BREAST CYST INCISION AND DRAINAGE     BREAST SURGERY     BUNIONECTOMY     CHOLECYSTECTOMY     DILATION AND CURETTAGE OF UTERUS     ESOPHAGOGASTRODUODENOSCOPY (EGD) WITH PROPOFOL N/A 01/03/2019   Procedure: ESOPHAGOGASTRODUODENOSCOPY (EGD) WITH PROPOFOL;  Surgeon: Otis Brace, MD;  Location: [facility];  Service: Gastroenterology;  Laterality: N/A;   FOREIGN BODY REMOVAL  01/03/2019   Procedure: FOREIGN BODY REMOVAL;  Surgeon: Otis Brace, MD;  Location: [facility];  Service: Gastroenterology;;   FRACTURE SURGERY     left wrist   HERNIA REPAIR     hiatal hernia   LUMBAR FUSION  07/26/2017   LUMBAR LAMINECTOMY  03/04/2012    Procedure: MICRODISCECTOMY LUMBAR LAMINECTOMY;  Surgeon: Jessy Oto, MD;  Location: [facility];  Service: Orthopedics;  Laterality: N/A;  L3-4 central laminectomy   NASAL SEPTUM SURGERY     nissan fundoplication     OVARIAN CYST SURGERY     POSTERIOR CERVICAL FUSION/FORAMINOTOMY N/A 06/05/2021   Procedure: OCCIPITAL- CERVICAL TWO INSTRUMENTATION AND FUSION; EXTENSION TO CERVICAL FIVE;  Surgeon: Karsten Ro, DO;  Location: [facility];  Service: Neurosurgery;  Laterality: N/A;   THORACIC FUSION  07/26/2017   TONSILLECTOMY     Patient Active Problem List   Diagnosis Date Noted   Neurogenic bladder 04/03/2022   Spondylosis, cervical, with myelopathy 04/03/2022   Myofascial pain dysfunction syndrome 12/17/2021   Hepatic cirrhosis ([place]) 06/26/2021   Fracture of C1 vertebra, closed ([place]) 06/09/2021   Closed C1 fracture ([place]) 06/05/2021   Plantar fasciitis of left foot 08/21/2018   Acute renal failure (ARF) ([place]) 02/09/2018   Chronic migraine without aura without status migrainosus, not intractable 06/16/2017   History of lumbar fusion 08/03/2016    Class: Chronic   Migraine 04/09/2016   Acute-on-chronic kidney injury ([place])  09/07/2015   Hypertension 09/07/2015   Hypothyroidism 09/07/2015   Gout 09/07/2015   Low back pain 09/07/2015   Obesity (BMI 30-39.9) 07/06/2013   Headache 04/12/2013   Concussion 04/12/2013   Lumbar spinal stenosis 03/04/2012    Class: Diagnosis of    PCP: Marton Redwood  REFERRING PROVIDER: Courtney Heys  REFERRING DIAG: M79.18, M54.2, Z98.890, M47.12  THERAPY DIAG:  Difficulty in walking, not elsewhere classified  Muscle weakness (generalized)  Radiculopathy, cervical region  Unsteadiness on feet  Other lack of coordination  Rationale for Evaluation and Treatment Rehabilitation  ONSET DATE: 04/03/22  SUBJECTIVE:                                                                                                                                                                                                          SUBJECTIVE STATEMENT: Doing fine, middle of back is hurting today because of arthritis. I went shopping for 3hrs yesterday so I am tired and my neck gets tired.   PERTINENT HISTORY:  Lumbar fusions and laminectomy, cervical fusion 2022  PAIN:  Are you having pain? Yes: NPRS scale: 4/10 Pain location: neck Pain description: achy, it just hurts Aggravating factors: bending, stretching, reaching overhead Relieving factors: pain med sometimes but I do not like taking them  PRECAUTIONS: Fall  WEIGHT BEARING RESTRICTIONS No  FALLS:  Has patient fallen in last 6 months? Yes. Number of falls 5 or 6   LIVING ENVIRONMENT: Lives with: lives alone Lives in: House/apartment Stairs: Yes: Internal: 14 steps; on left going up Has following equipment at home: Single point cane and Walker - 2 wheeled  OCCUPATION: retired  PLOF: Independent  PATIENT GOALS get my neck and back fixed, get my balance  OBJECTIVE:   DIAGNOSTIC FINDINGS:  FINDINGS: Single cross-table lateral portable spot fluoro image obtained of the upper cervical spine. Support catheters are seen overlying the hypopharynx.   IMPRESSION: Intraoperative localization during posterior cervical fusion.  PATIENT SURVEYS:  FOTO 37   COGNITION: Overall cognitive status: Within functional limits for tasks assessed   SENSATION: WFL  POSTURE: rounded shoulders, forward head, increased thoracic kyphosis, and flexed trunk   PALPATION: TTP and tightness along c-spine and UTs   CERVICAL ROM:   Active ROM A/PROM (deg) eval 06/11/22 06/25/22  Flexion 75% w/pain 50%  Decrease 25%  Extension Very limited due to pain Still very limited  75% limited  Right lateral flexion Limited due to pain limited 75% limited  Left lateral flexion Limited due to pain Limited  75% limited  Right rotation 20d  w/pain 20d  20deg  Left rotation 20d w/pain 20d  20deg   (Blank rows = not  tested)  UPPER EXTREMITY ROM:  Active ROM Right eval Left eval Right 06/11/22 Left  06/11/22  Shoulder flexion 110 110 120 120  Shoulder extension      Shoulder abduction 90 90 110 110  Shoulder adduction      Shoulder extension      Shoulder internal rotation 55 50 WFL WFL  Shoulder external rotation C7 C7 T2 T2  Elbow flexion      Elbow extension      Wrist flexion      Wrist extension      Wrist ulnar deviation      Wrist radial deviation      Wrist pronation      Wrist supination       (Blank rows = not tested)  UPPER EXTREMITY MMT:  grossly 4+/5 for all shoulder MMT and LE   FUNCTIONAL TESTS:  5 times sit to stand: 15.29s Timed up and go (TUG): 13.09s Berg Balance Scale: 31/56 tested on 04/28/22  VESTIBULAR TESTING Smooth pursuits and saccades are normal, no nystagmus   TODAY'S TREATMENT:  07/02/22 UBE L3 x63mns  Resisted gait 20# 4 way x3 Rows and lats 20# 2x10 FOTO Deep neck flexor strengthening supine 10s x5 and then with rotation and lift Chin tucks supine x10   06/30/22 Nustep L5 x626ms  UBE L2 x6m63m  Shoulder ext 5# 2x10 Cable rows 10# 2x10 Walking on beam tandem and side steps Leg ext 10# 2x10 HS curls 20# 2x10    06/25/22 UBE L2 x3 min each Leg press 40lb 2x10 Shoulder Ext 5lb 2x10 Standing rows 10lb 2x10   06/22/22 UBE L2 x 3 min each Leg press 40lb 2x12  Volley ball Side stepping on airex balance beam, tandem on the beam Shoulder Ext 5lb 2x10   Standing rows 10lb 2x10   06/18/22 UBE level 3 x 6 minutes Volley ball High step toe touches,then on airex toe touches Side stepping on airex balance beam, tandem on the beam Marches on the minitramp 20# lats 5# shoulder extension STM to the upper traps Figure 8's, box walk fwd/side/back  06/11/22 FOTO Shoulder and cervical ROM  5 times sit to stand: 12.43s Timed up and go (TUG): 11.41s Berg Balance Scale: 36/56  UBE L2 x 4mi28m Seated row 20# 2x10 Lat pull downs 20# 2x10 Shoulder  ext 5# 2x10   PATIENT EDUCATION:  Education details: Reviewed previous HEP Person educated: Patient Education method: Explanation Education comprehension: verbalized understanding   HOME EXERCISE PROGRAM: Access Code: [email or web address]  ASSESSMENT:  CLINICAL IMPRESSION: Patient reports weakness in her neck and trouble holding up her neck when she's out and shopping for long periods. We added some neck strengthening exercises today and to her HEP. Continued with strengthening and some balance.   OBJECTIVE IMPAIRMENTS Abnormal gait, decreased balance, decreased coordination, decreased ROM, decreased strength, decreased safety awareness, hypomobility, impaired flexibility, improper body mechanics, and pain.   ACTIVITY LIMITATIONS carrying, lifting, bending, sitting, standing, squatting, sleeping, stairs, reach over head, hygiene/grooming, and locomotion level  PARTICIPATION LIMITATIONS: meal prep, cleaning, laundry, community activity, and yard work  PERSONAL FACTORS Age, Behavior pattern, Past/current experiences, and 1-2 comorbidities: hx of neck and back surgery, HTN, chronic kidney disease  are also affecting patient's functional outcome.   REHAB POTENTIAL: Fair    CLINICAL DECISION MAKING: Evolving/moderate complexity  EVALUATION COMPLEXITY: Moderate   GOALS: Goals reviewed with patient?  No  SHORT TERM GOALS: Target date: 05/27/22  Patient will be independent with initial HEP.  Goal status: met  2.  Patient will report pain levels at worst to be at least 5/10  Baseline: 7/10 Goal status:Met    LONG TERM GOALS: Target date: 06/24/22  1.  Patient will demonstrate full pain free cervical ROM for safety with driving.  Goal status: on going  2.  Patient will demonstrate improved posture to decrease muscle imbalance. Goal status: Progressing  3.  Patient will report 44 on FOTO (patient outcome measure)  to demonstrate improved functional ability.  Baseline: 37, 43- 8/31,  42-9/21 Goal status: IN PROGRESS   6.  Patient will increase BERG score by 10 points to demonstrate improved balance and decreased fall risk.   Baseline: 31/56, 36- 8/31 Goal status: IN PROGRESS 45/56 06/25/22   PLAN: PT FREQUENCY: 2x/week  PT DURATION: 8 weeks  PLANNED INTERVENTIONS: Therapeutic exercises, Therapeutic activity, Neuromuscular re-education, Balance training, Gait training, Patient/Family education, Self Care, Joint mobilization, Dry Needling, Electrical stimulation, Spinal mobilization, Cryotherapy, Moist heat, Taping, Vasopneumatic device, Traction, Ultrasound, Ionotophoresis 93m/ml Dexamethasone, Manual therapy, and Re-evaluation  PLAN FOR NEXT SESSION: work on strength, balance and treat spasms as needed  MAndris Baumann DPT 07/02/2022, 12:30 PM

## 2022-07-02 ENCOUNTER — Ambulatory Visit: Payer: Medicare Other

## 2022-07-02 DIAGNOSIS — M5412 Radiculopathy, cervical region: Secondary | ICD-10-CM

## 2022-07-02 DIAGNOSIS — R278 Other lack of coordination: Secondary | ICD-10-CM

## 2022-07-02 DIAGNOSIS — R262 Difficulty in walking, not elsewhere classified: Secondary | ICD-10-CM

## 2022-07-02 DIAGNOSIS — R2681 Unsteadiness on feet: Secondary | ICD-10-CM

## 2022-07-02 DIAGNOSIS — M6281 Muscle weakness (generalized): Secondary | ICD-10-CM

## 2022-07-06 NOTE — Therapy (Signed)
OUTPATIENT PHYSICAL THERAPY CERVICAL TREATMENT   Patient Name: Becky Hobbs MRN: [id number] DOB:30-Dec-1945, 76 y.o., female Today's Date: 07/07/2022   PT End of Session - 07/07/22 1502     Visit Number 16    Date for PT Re-Evaluation 07/24/22    PT Start Time 1502    Activity Tolerance Patient tolerated treatment well    Behavior During Therapy [facility] for tasks assessed/performed                    Past Medical History:  Diagnosis Date   Anemia    Anxiety    panic attacks   Arthritis    Asthma    Blood transfusion    1973--with twins birth   Bronchitis    Chronic kidney disease    Diabetes mellitus    borderline   GERD (gastroesophageal reflux disease)    H/O hiatal hernia    surgery fixed   Headache    migraines   Heart murmur    MVP   Hepatitis    Hep C   Hypertension    Hypothyroidism    Pneumonia    Past Surgical History:  Procedure Laterality Date   ABDOMINAL HYSTERECTOMY     APPENDECTOMY     APPLICATION OF INTRAOPERATIVE CT SCAN N/A 06/05/2021   Procedure: APPLICATION OF INTRAOPERATIVE CT SCAN;  Surgeon: Karsten Ro, DO;  Location: [place];  Service: Neurosurgery;  Laterality: N/A;   BACK SURGERY     bladder tack     BREAST CYST EXCISION     BREAST CYST INCISION AND DRAINAGE     BREAST SURGERY     BUNIONECTOMY     CHOLECYSTECTOMY     DILATION AND CURETTAGE OF UTERUS     ESOPHAGOGASTRODUODENOSCOPY (EGD) WITH PROPOFOL N/A 01/03/2019   Procedure: ESOPHAGOGASTRODUODENOSCOPY (EGD) WITH PROPOFOL;  Surgeon: Otis Brace, MD;  Location: [place];  Service: Gastroenterology;  Laterality: N/A;   FOREIGN BODY REMOVAL  01/03/2019   Procedure: FOREIGN BODY REMOVAL;  Surgeon: Otis Brace, MD;  Location: [place] [facility];  Service: Gastroenterology;;   FRACTURE SURGERY     left wrist   HERNIA REPAIR     hiatal hernia   LUMBAR FUSION  07/26/2017   LUMBAR LAMINECTOMY  03/04/2012   Procedure: MICRODISCECTOMY LUMBAR LAMINECTOMY;  Surgeon:  Jessy Oto, MD;  Location: [place];  Service: Orthopedics;  Laterality: N/A;  L3-4 central laminectomy   NASAL SEPTUM SURGERY     nissan fundoplication     OVARIAN CYST SURGERY     POSTERIOR CERVICAL FUSION/FORAMINOTOMY N/A 06/05/2021   Procedure: OCCIPITAL- CERVICAL TWO INSTRUMENTATION AND FUSION; EXTENSION TO CERVICAL FIVE;  Surgeon: Karsten Ro, DO;  Location: [place];  Service: Neurosurgery;  Laterality: N/A;   THORACIC FUSION  07/26/2017   TONSILLECTOMY     Patient Active Problem List   Diagnosis Date Noted   Neurogenic bladder 04/03/2022   Spondylosis, cervical, with myelopathy 04/03/2022   Myofascial pain dysfunction syndrome 12/17/2021   Hepatic cirrhosis ([place]) 06/26/2021   Fracture of C1 vertebra, closed ([place]) 06/09/2021   Closed C1 fracture ([place]) 06/05/2021   Plantar fasciitis of left foot 08/21/2018   Acute renal failure (ARF) ([place]) 02/09/2018   Chronic migraine without aura without status migrainosus, not intractable 06/16/2017   History of lumbar fusion 08/03/2016    Class: Chronic   Migraine 04/09/2016   Acute-on-chronic kidney injury ([place]) 09/07/2015   Hypertension 09/07/2015   Hypothyroidism 09/07/2015   Gout 09/07/2015  Low back pain 09/07/2015   Obesity (BMI 30-39.9) 07/06/2013   Headache 04/12/2013   Concussion 04/12/2013   Lumbar spinal stenosis 03/04/2012    Class: Diagnosis of    PCP: Marton Redwood  REFERRING PROVIDER: Courtney Heys  REFERRING DIAG: M79.18, M54.2, Z98.890, M47.12  THERAPY DIAG:  Radiculopathy, cervical region  Muscle weakness (generalized)  Difficulty in walking, not elsewhere classified  Unsteadiness on feet  Other lack of coordination  Chronic 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   PERTINENT HISTORY:  Lumbar fusions and laminectomy, cervical fusion 2022  PAIN:  Are you having pain? Yes: NPRS scale: 4/10 Pain location: neck Pain description: achy, it just hurts Aggravating factors: bending, stretching, reaching overhead Relieving factors: pain med sometimes but I do not like taking them  PRECAUTIONS: Fall  WEIGHT BEARING RESTRICTIONS No  FALLS:  Has patient fallen in last 6 months? Yes. Number of falls 5 or 6   LIVING ENVIRONMENT: Lives with: lives alone Lives in: House/apartment Stairs: Yes: Internal: 14 steps; on left going up Has following equipment at home: Single point cane and Walker - 2 wheeled  OCCUPATION: retired  PLOF: Independent  PATIENT GOALS get my neck and back fixed, get my balance  OBJECTIVE:   DIAGNOSTIC FINDINGS:  FINDINGS: Single cross-table lateral portable spot fluoro image obtained of the upper cervical spine. Support catheters are seen overlying the hypopharynx.   IMPRESSION: Intraoperative localization during posterior cervical fusion.  PATIENT SURVEYS:  FOTO 37   COGNITION: Overall cognitive status: Within functional limits for tasks assessed   SENSATION: WFL  POSTURE: rounded shoulders, forward head, increased thoracic kyphosis, and flexed trunk   PALPATION: TTP and tightness along c-spine and UTs   CERVICAL ROM:   Active ROM A/PROM (deg) eval 06/11/22 06/25/22  Flexion 75% w/pain 50%  Decrease 25%  Extension Very limited due to pain Still very limited  75% limited  Right lateral flexion Limited due to pain limited 75% limited  Left lateral flexion Limited due to pain Limited  75% limited  Right rotation 20d w/pain 20d  20deg  Left rotation 20d w/pain 20d  20deg   (Blank rows = not tested)  UPPER  EXTREMITY ROM:  Active ROM Right eval Left eval Right 06/11/22 Left  06/11/22  Shoulder flexion 110 110 120 120  Shoulder extension      Shoulder abduction 90 90 110 110  Shoulder adduction      Shoulder extension      Shoulder internal rotation 55 50 WFL WFL  Shoulder external rotation C7 C7 T2 T2  Elbow flexion      Elbow extension      Wrist flexion      Wrist extension      Wrist ulnar deviation      Wrist radial deviation      Wrist pronation      Wrist supination       (Blank rows = not tested)  UPPER EXTREMITY MMT:  grossly 4+/5 for all shoulder MMT and LE   FUNCTIONAL TESTS:  5 times sit to stand: 15.29s Timed up and go (TUG): 13.09s Berg Balance Scale: 31/56 tested on 04/28/22  VESTIBULAR TESTING Smooth pursuits and saccades are normal, no nystagmus   TODAY'S TREATMENT:  07/07/22 NuStep L5x36mns  Cervical retraction against ball x10 - shoulder flexion 2# WaTe 2x10 - horizontal abd redTB 2x10  Shoulder flexion 2# 2x10 Lateral raise 2# 2x10  STS w/OHP yellow ball 2x10    07/02/22 UBE L3 x642ms  Resisted gait 20# 4 way x3 Rows and lats 20# 2x10 FOTO Deep neck flexor strengthening supine 10s x5 and then with rotation and lift Chin tucks supine x10   06/30/22 Nustep L5 x6m66m  UBE L2 x6mi98m Shoulder ext 5# 2x10 Cable rows 10# 2x10 Walking on beam tandem and side steps Leg ext 10# 2x10 HS curls 20# 2x10    06/25/22 UBE L2 x3 min each Leg press 40lb 2x10 Shoulder Ext 5lb 2x10 Standing rows 10lb 2x10   06/22/22 UBE L2 x 3 min each Leg press 40lb 2x12  Volley ball Side stepping on airex balance beam, tandem on the beam Shoulder Ext 5lb 2x10   Standing rows 10lb 2x10   06/18/22 UBE level 3 x 6 minutes Volley ball High step toe touches,then on airex toe touches Side stepping on airex balance beam, tandem on the beam Marches on the minitramp 20# lats 5# shoulder extension STM to the upper traps Figure 8's, box walk  fwd/side/back  06/11/22 FOTO Shoulder and cervical ROM  5 times sit to stand: 12.43s Timed up and go (TUG): 11.41s Berg Balance Scale: 36/56  UBE L2 x 4min39mSeated row 20# 2x10 Lat pull downs 20# 2x10 Shoulder ext 5# 2x10   PATIENT EDUCATION:  Education details: Reviewed previous HEP Person educated: Patient Education method: Explanation Education comprehension: verbalized understanding   HOME EXERCISE PROGRAM: Access Code: [email or web address]  ASSESSMENT:  CLINICAL IMPRESSION: Session focused on neck and UE strengthening today. Added in some LE strengthening too to help with balance and gait with STS. Tolerates session well.   OBJECTIVE IMPAIRMENTS Abnormal gait, decreased balance, decreased coordination, decreased ROM, decreased strength, decreased safety awareness, hypomobility, impaired flexibility, improper body mechanics, and pain.   ACTIVITY LIMITATIONS carrying, lifting, bending, sitting, standing, squatting, sleeping, stairs, reach over head, hygiene/grooming, and locomotion level  PARTICIPATION LIMITATIONS: meal prep, cleaning, laundry, community activity, and yard work  PERSONAL FACTORS Age, Behavior pattern, Past/current experiences, and 1-2 comorbidities: hx of neck and back surgery, HTN, chronic kidney disease  are also affecting patient's functional outcome.   REHAB POTENTIAL: Fair    CLINICAL  DECISION MAKING: Evolving/moderate complexity  EVALUATION COMPLEXITY: Moderate   GOALS: Goals reviewed with patient? No  SHORT TERM GOALS: Target date: 05/27/22  Patient will be independent with initial HEP.  Goal status: met  2.  Patient will report pain levels at worst to be at least 5/10  Baseline: 7/10 Goal status:Met    LONG TERM GOALS: Target date: 07/24/22  1.  Patient will demonstrate full pain free cervical ROM for safety with driving.  Goal status: on going  2.  Patient will demonstrate improved posture to decrease muscle imbalance. Goal status:  Progressing  3.  Patient will report 11 on FOTO (patient outcome measure)  to demonstrate improved functional ability.  Baseline: 37, 43- 8/31, 42-9/21 Goal status: IN PROGRESS   4.  Patient will increase BERG score by 10 points to demonstrate improved balance and decreased fall risk.   Baseline: 31/56, 36- 8/31 Goal status: IN PROGRESS 45/56 06/25/22   PLAN: PT FREQUENCY: 2x/week  PT DURATION: 8 weeks  PLANNED INTERVENTIONS: Therapeutic exercises, Therapeutic activity, Neuromuscular re-education, Balance training, Gait training, Patient/Family education, Self Care, Joint mobilization, Dry Needling, Electrical stimulation, Spinal mobilization, Cryotherapy, Moist heat, Taping, Vasopneumatic device, Traction, Ultrasound, Ionotophoresis 54m/ml Dexamethasone, Manual therapy, and Re-evaluation  PLAN FOR NEXT SESSION: work on strength, balance and treat spasms as needed  MAndris Baumann DPT 07/07/2022, 3:40 PM

## 2022-07-07 ENCOUNTER — Ambulatory Visit: Payer: Medicare Other

## 2022-07-07 DIAGNOSIS — M5412 Radiculopathy, cervical region: Secondary | ICD-10-CM | POA: Diagnosis not present

## 2022-07-07 DIAGNOSIS — M6281 Muscle weakness (generalized): Secondary | ICD-10-CM

## 2022-07-07 DIAGNOSIS — R2681 Unsteadiness on feet: Secondary | ICD-10-CM

## 2022-07-07 DIAGNOSIS — R262 Difficulty in walking, not elsewhere classified: Secondary | ICD-10-CM

## 2022-07-07 DIAGNOSIS — R278 Other lack of coordination: Secondary | ICD-10-CM

## 2022-07-07 DIAGNOSIS — G8929 Other chronic pain: Secondary | ICD-10-CM

## 2022-07-09 ENCOUNTER — Ambulatory Visit: Payer: Medicare Other

## 2022-07-13 ENCOUNTER — Ambulatory Visit: Payer: Medicare Other | Attending: Physical Medicine and Rehabilitation | Admitting: Physical Therapy

## 2022-07-16 ENCOUNTER — Ambulatory Visit: Payer: Medicare Other | Admitting: Physical Therapy

## 2022-07-22 ENCOUNTER — Ambulatory Visit: Payer: Medicare Other | Admitting: Physical Medicine and Rehabilitation

## 2022-08-12 ENCOUNTER — Encounter: Payer: Self-pay | Admitting: Physical Medicine and Rehabilitation

## 2022-08-12 ENCOUNTER — Encounter
Payer: Medicare Other | Attending: Physical Medicine and Rehabilitation | Admitting: Physical Medicine and Rehabilitation

## 2022-08-12 ENCOUNTER — Encounter: Payer: Medicare Other | Admitting: Physical Medicine and Rehabilitation

## 2022-08-12 VITALS — BP 120/76 | HR 77 | Ht 64.0 in | Wt 195.0 lb

## 2022-08-12 DIAGNOSIS — M7918 Myalgia, other site: Secondary | ICD-10-CM | POA: Insufficient documentation

## 2022-08-12 DIAGNOSIS — G8250 Quadriplegia, unspecified: Secondary | ICD-10-CM | POA: Diagnosis not present

## 2022-08-12 DIAGNOSIS — R252 Cramp and spasm: Secondary | ICD-10-CM | POA: Insufficient documentation

## 2022-08-12 DIAGNOSIS — N184 Chronic kidney disease, stage 4 (severe): Secondary | ICD-10-CM | POA: Insufficient documentation

## 2022-08-12 MED ORDER — TIZANIDINE HCL 2 MG PO TABS: 2.0000 mg | ORAL_TABLET | Freq: Three times a day (TID) | ORAL | 5 refills | Status: AC

## 2022-08-12 NOTE — Patient Instructions (Signed)
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 C1 ASIA D- almost ASIA E. Also has myofascial pain.   Stiffness that I'm trying to treat- is spasticity- due to lack of info getting to muscles from brain-  2. Cannot do Baclofen at current dose if Creatinine- is still up to 3.02- If it's more than 2.6, we have to stop the baclofen completely.   3. Can still take the Zanaflex/Tizanidine- 2 mg up to 3x/day as needed for spasticity- # 90- 5 refills.    4. Get back to dry needling!   5. Has Imitrex and not taking Topamax anymore just when has migraine-    6. F/U- 6 months- double appt-   7. Has lost 25 lbs since started Ozempic-

## 2022-08-12 NOTE — Progress Notes (Signed)
Subjective:    Patient ID: Becky Hobbs, female    DOB: [birth year], 76 y.o.   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 . Also has myofascial pain.    Good ! Hasn't been to PT in 3-4 weeks.   If shopping, has to lean on cart- takes 3 hours til leans on cart Can be in [employer] for "Days".    Takes Baclofen 2-3x/week- to help her get OOB- makes her less sleepy- Takes Zanaflex at night.    Back doctor gave her Percocet- takes all the pain away. From Spine Scoliosis- had appt this last Monday, but NP cancelled it. Writes for every so often.   Still does the tennis ball- helps neck and low back  Does Hep for neck to strengthen it- does at least 4x/week.  Sisters love the tennis ball as well.   Got dry needling form PT- hurts when gets it, but the next day - "is the bomb".  Hasn't had lately- but might need to go again.   [place] 1 mile from her house.   Is driving- short distances at least. Plans on going to [place]- has 7 great babies- all in [place] actually.    Last Cr 3,02- hasn't had it checked since August-  Has appt with kidney specialist in December and PCP tomorrow-     Pain Inventory Average Pain 3 Pain Right Now 4 My pain is intermittent, constant, and stabbing  In the last 24 hours, has pain interfered with the following? General activity 0 Relation with others 5 Enjoyment of life 7 What TIME of day is your pain at its worst? morning , daytime, evening, and night Sleep (in general) Poor  Pain is worse with: walking, bending, and standing Pain improves with: rest and medication Relief from Meds: 10  Family History  Problem Relation Age of Onset   Kidney disease Mother    Vascular Disease  Mother    Social History   Socioeconomic History   Marital status: Widowed    Spouse name: Not on file   Number of children: 3   Years of education: college   Highest education level: Not on file  Occupational History   Occupation: retired    Comment: Retired  Tobacco Use   Smoking status: Former    Types: Cigarettes   Smokeless tobacco: Never  Scientific laboratory technician Use: Never used  Substance and Sexual Activity   Alcohol use: No   Drug use: No   Sexual activity: Never  Other Topics Concern   Not on file  Social History Narrative   Patient has a high school education.    Patient is widow.   Patient is retired.   Social Determinants of Health   Financial Resource Strain: Not on file  Food Insecurity: Not on file  Transportation Needs: Not on file  Physical Activity: Not on file  Stress: Not on file  Social Connections: Not on file   Past Surgical History:  Procedure Laterality Date   ABDOMINAL HYSTERECTOMY     APPENDECTOMY     APPLICATION OF INTRAOPERATIVE CT SCAN N/A 06/05/2021   Procedure: APPLICATION OF INTRAOPERATIVE CT SCAN;  Surgeon: Karsten Ro, DO;  Location: [place] [facility];  Service: Neurosurgery;  Laterality: N/A;   BACK SURGERY     bladder tack     BREAST CYST EXCISION     BREAST CYST INCISION AND DRAINAGE     BREAST SURGERY     BUNIONECTOMY     CHOLECYSTECTOMY     DILATION AND CURETTAGE OF UTERUS     ESOPHAGOGASTRODUODENOSCOPY (EGD) WITH PROPOFOL N/A 01/03/2019   Procedure: ESOPHAGOGASTRODUODENOSCOPY (EGD) WITH PROPOFOL;  Surgeon: Otis Brace, MD;  Location: [place];  Service: Gastroenterology;  Laterality: N/A;   FOREIGN BODY REMOVAL  01/03/2019   Procedure: FOREIGN BODY REMOVAL;  Surgeon: Otis Brace, MD;  Location: [place] [facility];  Service: Gastroenterology;;   FRACTURE SURGERY     left wrist   HERNIA REPAIR     hiatal hernia   LUMBAR FUSION  07/26/2017   LUMBAR LAMINECTOMY  03/04/2012   Procedure: MICRODISCECTOMY LUMBAR LAMINECTOMY;   Surgeon: Jessy Oto, MD;  Location: [place];  Service: Orthopedics;  Laterality: N/A;  L3-4 central laminectomy   NASAL SEPTUM SURGERY     nissan fundoplication     OVARIAN CYST SURGERY     POSTERIOR CERVICAL FUSION/FORAMINOTOMY N/A 06/05/2021   Procedure: OCCIPITAL- CERVICAL TWO INSTRUMENTATION AND FUSION; EXTENSION TO CERVICAL FIVE;  Surgeon: Karsten Ro, DO;  Location: [place];  Service: Neurosurgery;  Laterality: N/A;   THORACIC FUSION  07/26/2017   TONSILLECTOMY     Past Surgical History:  Procedure Laterality Date   ABDOMINAL HYSTERECTOMY     APPENDECTOMY     APPLICATION OF INTRAOPERATIVE CT SCAN N/A 06/05/2021   Procedure: APPLICATION OF INTRAOPERATIVE CT SCAN;  Surgeon: Dawley, Theodoro Doing, DO;  Location: [place];  Service: Neurosurgery;  Laterality: N/A;   BACK SURGERY     bladder tack     BREAST CYST EXCISION     BREAST CYST INCISION AND DRAINAGE     BREAST SURGERY     BUNIONECTOMY     CHOLECYSTECTOMY     DILATION AND CURETTAGE OF UTERUS     ESOPHAGOGASTRODUODENOSCOPY (EGD) WITH PROPOFOL N/A 01/03/2019   Procedure: ESOPHAGOGASTRODUODENOSCOPY (EGD) WITH PROPOFOL;  Surgeon: Otis Brace, MD;  Location: [place];  Service: Gastroenterology;  Laterality: N/A;   FOREIGN BODY REMOVAL  01/03/2019   Procedure: FOREIGN BODY REMOVAL;  Surgeon: Otis Brace, MD;  Location: [place] [facility];  Service: Gastroenterology;;   FRACTURE SURGERY     left wrist   HERNIA REPAIR     hiatal hernia   LUMBAR FUSION  07/26/2017   LUMBAR LAMINECTOMY  03/04/2012   Procedure: MICRODISCECTOMY LUMBAR LAMINECTOMY;  Surgeon: Jessy Oto, MD;  Location: [place];  Service: Orthopedics;  Laterality: N/A;  L3-4 central laminectomy   NASAL SEPTUM SURGERY     nissan fundoplication     OVARIAN CYST SURGERY     POSTERIOR CERVICAL FUSION/FORAMINOTOMY N/A 06/05/2021   Procedure: OCCIPITAL- CERVICAL TWO INSTRUMENTATION AND FUSION; EXTENSION TO CERVICAL FIVE;  Surgeon: Karsten Ro, DO;  Location: [place];   Service: Neurosurgery;  Laterality: N/A;   THORACIC FUSION  07/26/2017   TONSILLECTOMY     Past Medical History:  Diagnosis Date   Anemia    Anxiety    panic attacks   Arthritis    Asthma    Blood transfusion    1973--with twins birth   Bronchitis    Chronic kidney disease    Diabetes mellitus    borderline   GERD (gastroesophageal reflux disease)    H/O hiatal hernia  surgery fixed   Headache    migraines   Heart murmur    MVP   Hepatitis    Hep C   Hypertension    Hypothyroidism    Pneumonia    Ht '5\' 4"'$  (1.626 m)   Wt 195 lb (88.5 kg)   BMI 33.47 kg/m   Opioid Risk Score:   Fall Risk Score:  `1  Depression screen [facility] 2/9     04/03/2022   11:23 AM 12/17/2021   11:47 AM 08/01/2021   10:13 AM  Depression screen PHQ 2/9  Decreased Interest 0 0 0  Down, Depressed, Hopeless 0 0 0  PHQ - 2 Score 0 0 0  Altered sleeping   3  Tired, decreased energy   3  Change in appetite   1  Feeling bad or failure about yourself    0  Trouble concentrating   0  Moving slowly or fidgety/restless   0  Suicidal thoughts   0  PHQ-9 Score   7    Review of Systems  Musculoskeletal:  Positive for back pain.  All other systems reviewed and are negative.      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 C1 ASIA D- almost ASIA E. Also has myofascial pain.   Stiffness that I'm trying to treat- is spasticity- due to lack of info  getting to muscles from brain-  2. Cannot do Baclofen at current dose if Creatinine- is still up to 3.02- If it's more than 2.6, we have to stop the baclofen completely.   3. Can still take the Zanaflex/Tizanidine- 2 mg up to 3x/day as needed for spasticity- # 90- 5 refills.    4. Get back to dry needling!   5. Has Imitrex and not taking Topamax anymore just when has migraine-    6. F/U- 6 months- double appt-   7. Has lost 25 lbs since started Ozempic-   I spent a total of 23   minutes on total care today- >50% coordination of care- due to discussing SCI and spasticity and CKD

## 2022-09-26 ENCOUNTER — Emergency Department (HOSPITAL_COMMUNITY): Payer: Medicare Other

## 2022-09-26 ENCOUNTER — Emergency Department (HOSPITAL_COMMUNITY)
Admission: EM | Admit: 2022-09-26 | Discharge: 2022-09-27 | Disposition: A | Discharge status: Home / Self Care | Payer: Medicare Other | Attending: Emergency Medicine | Admitting: Emergency Medicine

## 2022-09-26 ENCOUNTER — Other Ambulatory Visit: Payer: Self-pay

## 2022-09-26 DIAGNOSIS — M542 Cervicalgia: Secondary | ICD-10-CM | POA: Insufficient documentation

## 2022-09-26 DIAGNOSIS — S0990XA Unspecified injury of head, initial encounter: Secondary | ICD-10-CM | POA: Insufficient documentation

## 2022-09-26 DIAGNOSIS — R0789 Other chest pain: Secondary | ICD-10-CM | POA: Diagnosis present

## 2022-09-26 DIAGNOSIS — S161XXA Strain of muscle, fascia and tendon at neck level, initial encounter: Secondary | ICD-10-CM

## 2022-09-26 DIAGNOSIS — Y92410 Unspecified street and highway as the place of occurrence of the external cause: Secondary | ICD-10-CM | POA: Diagnosis not present

## 2022-09-26 MED ORDER — MORPHINE SULFATE (PF) 4 MG/ML IV SOLN
6.0000 mg | Freq: Once | INTRAVENOUS | Status: AC
  Administered 2022-09-26: 6 mg via SUBCUTANEOUS
  Filled 2022-09-26: qty 2

## 2022-09-26 MED ORDER — OXYCODONE-ACETAMINOPHEN 5-325 MG PO TABS: 1.0000 | ORAL_TABLET | Freq: Four times a day (QID) | ORAL | 0 refills | Status: AC | PRN

## 2022-09-26 MED ORDER — METHOCARBAMOL 500 MG PO TABS: 500.0000 mg | ORAL_TABLET | Freq: Two times a day (BID) | ORAL | 0 refills | Status: AC

## 2022-09-26 MED ORDER — HYDROXYZINE HCL 25 MG PO TABS: 25.0000 mg | ORAL_TABLET | Freq: Four times a day (QID) | ORAL | 0 refills | Status: AC

## 2022-09-26 MED ORDER — DIPHENHYDRAMINE HCL 50 MG/ML IJ SOLN
25.0000 mg | Freq: Once | INTRAMUSCULAR | Status: AC
Start: 2022-09-26 — End: 2022-09-26
  Administered 2022-09-26: 25 mg via INTRAVENOUS
  Filled 2022-09-26: qty 1

## 2022-09-26 NOTE — ED Notes (Signed)
Becky Hobbs [phone] would like an update asap and if discharged, called so she can pick her up

## 2022-09-26 NOTE — Discharge Instructions (Signed)
Follow-up with Dr. Vertell Limber for any continued neck pain that you may have.
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
## 2022-09-26 NOTE — ED Notes (Signed)
Patient transported to CT 

## 2022-09-26 NOTE — ED Notes (Signed)
This RN spoke to pt's son, and he will be on his way shortly to take pt home

## 2022-09-26 NOTE — ED Notes (Signed)
Pt's sister, Rebekah Chesterfield, given update via phone
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
## 2022-09-27 NOTE — ED Notes (Signed)
Patient verbalizes understanding of discharge instructions. Opportunity for questioning and answers were provided. Armband removed by staff, pt discharged from ED. Wheeled out to lobby, assisted into car with son

## 2022-09-29 ENCOUNTER — Telehealth: Payer: Self-pay | Admitting: Physical Medicine and Rehabilitation
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
## 2022-11-09 ENCOUNTER — Ambulatory Visit: Payer: Medicare Other | Admitting: Physical Medicine and Rehabilitation

## 2022-12-01 ENCOUNTER — Telehealth: Payer: Self-pay | Admitting: Neurology

## 2022-12-01 NOTE — Telephone Encounter (Signed)
Laboratory evaluation from [facility] November 16, 2022, elevated creatinine 2.09, GFR 24, UA showed 3+ WBC esterase, hemoglobin of 11.9,

## 2022-12-08 ENCOUNTER — Other Ambulatory Visit: Payer: Self-pay | Admitting: Nurse Practitioner

## 2022-12-08 DIAGNOSIS — K7469 Other cirrhosis of liver: Secondary | ICD-10-CM
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
## 2022-12-18 NOTE — Telephone Encounter (Signed)
 Kidney Transplant Evaluation Note:  Pt chart reviewed by Dr. Midge Sieving on 12/17/22.  Pt did not meet selection criteria for listing per most recent policy.   Pt determined to be not a candidate for transplant at this time related to cirrhosis of the liver.   VM left for Dr. Mordecai, Sherice to notify him of the information stated above.  TC to pt to notify her of NAC status. Pt stated,Okay.  Pt informed that they may be referred to another transplant center by their nephrologist and verbalized understanding of the above.   Letter sent to patient and faxed to Dr. Mordecai at [phone].

## 2023-01-07 ENCOUNTER — Ambulatory Visit
Admission: RE | Admit: 2023-01-07 | Discharge: 2023-01-07 | Disposition: A | Discharge status: Home / Self Care | Payer: Medicare Other | Source: Ambulatory Visit | Attending: Nurse Practitioner | Admitting: Nurse Practitioner

## 2023-01-07 DIAGNOSIS — K7469 Other cirrhosis of liver: Secondary | ICD-10-CM

## 2023-01-15 NOTE — Telephone Encounter (Signed)
 REMINDER CALL MADE LM ON VM WITH PRE OP APT FOR 01/22/23 @2 :30 PM.
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
## 2023-01-29 NOTE — Telephone Encounter (Signed)
 LM ON VM TO RETURN CALL TO GET SURGERY ARRIVAL TIME FOR MONDAY 02/01/23 CALL [phone] IF AFTER 5 PM TODAY CALL [phone]

## 2023-01-29 NOTE — Telephone Encounter (Signed)
 PT RETURNED CALL ARRIVAL 9:45 AM SURGERY 11:45 AM NPO 11 PM NIGHT BEFORE SURGERY

## 2023-02-01 NOTE — Progress Notes (Signed)
 Case Management Update  Date: 02/01/2023   Time: 3:53 PM   Patient Type: Observation  Patient admitted for:  Diagnosis:  1.  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  Mechanical failure of spinal instrumentation 3.  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 Anterior cervical discectomy and arthrodesis C3-4 and C4-5 with wide decompression of the thecal sac and nerve roots bilaterally.  2. Insertion Medtronic biomechanical titanium cage at C3-4 and at C4-5.  3. Anterior cervical instrumentation utilizing the Medtronic Zevo plating system C3 through C5.  4. Harvest of anterior iliac crest bone graft from the left hip through a separate skin and fascial incision.  5. Local autogenous bone graft supplemented with grafton allograft.     Plan:  -monitoring VS, labs, surgical dressing -pain management -PT/OT eval  CM will continue to follow   Suzen MARLA Null, RN  BSN, Care Manager, Office #: [phone], Mobile #: [phone]

## 2023-02-01 NOTE — Anesthesia Procedure Notes (Signed)
  Airway Date/Time: 02/01/2023 11:32 AM Urgency: elective  Airway not difficult  General Information and Staff Patient location during procedure: OR Performed: CRNA  Anesthesiologist: Elsie Charlie Norlander, MD CRNA: Alm Felecia Ast, CRNA  Indications and Patient Condition Indications for airway management: anesthesia Spontaneous ventilation: present Sedation level: deep Preoxygenated: yes Rapid sequence: yes Cricoid pressure: yes Mask difficulty assessment: 1 - vent by mask  Final Airway Details Final airway type: endotracheal airway Successful airway: ETT Cuffed: yes  Successful intubation technique: video laryngoscopy - Glidescope (#3 blade) Endotracheal tube insertion site: oral Blade: Macintosh Blade size: #2 ETT size (mm): 7.0 Cormack-Lehane Classification: grade I - full view of glottis Placement verified by: chest auscultation, capnometry and palpation of cuff  Cuff volume (mL): 8 Measured from: gums ETT to gums (cm): 21 Number of attempts at approach: 1 Final Dental Condition: Teeth, lips, and tongue in pre-anesthetic condition Patient tolerated procedure well with no complications  Additional Comments .d

## 2023-02-01 NOTE — Care Plan (Signed)
 Problem: Health Behavior: Goal: MCB Ability to state ways to decrease the risk of falls will be met by discharge Description: Ability to state ways to decrease the risk of falls will improve by discharge Outcome: Progressing   Problem: Safety: Goal: Will remain free from falls by discharge Description: Will remain free from falls by discharge Outcome: Progressing Goal: Safe Environmental Mobility Description: Ability to move safely in the environment will improve by discharge Outcome: Progressing   Problem: Blood pressure monitoring Goal: Establish plan for regular lab work Outcome: Progressing   Problem: Patient is hypertensive Goal: Remain at or below target blood pressure Outcome: Progressing Goal: Establish regular follow-ups with PCP Outcome: Progressing   Problem: Activity: Goal: Ambulation Ability Description: Ability to ambulate will improve by discharge Outcome: Progressing Goal: Changes Position in Bed Independently Description: Ability to safely and independently change position in bed will improve by discharge Outcome: Progressing Goal: Demonstrates Log-Rolling Technique Description: Ability to demonstrate correct log-rolling technique will improve by discharge Outcome: Progressing Goal: Tolerates Increased Activity Description: Ability to tolerate increased activity will improve by discharge Outcome: Progressing   Problem: Bowel/Gastric: Goal: Establishes Normal Bowel Function Description: Establishment of normal bowel function will improve by discharge Outcome: Progressing   Problem: Health Behavior: Goal: Therapeutic Regimen Knowledge Description: Knowledge of the prescribed therapeutic regimen will improve by discharge Outcome: Progressing   Problem: Health Behavior Goal: Agrees to [employer] All Discharge Instructions Description: Will agree to carry out all discharge instructions by discharge Outcome: Progressing Goal: States Signs and Symptoms to  Report to Health Care Provider Description: Ability to state signs and symptoms to report to health care provider will improve by discharge Outcome: Progressing   Problem: Nutritional: Goal: Maintains Optimal Nutritional Status Description: Ability to attain and maintain optimal nutritional status will improve by discharge Outcome: Progressing   Problem: Physical Regulation: Goal: Ability to maintain vital signs within normal range will improve by discharge Outcome: Progressing Goal: Will remain free from infection Outcome: Progressing   Problem: Respiratory: Goal: Maintains Clear Airway Description: Ability to maintain a clear airway will improve by discharge Outcome: Progressing Goal: Ability to maintain normal pulse oximetry readings will improve by discharge Description: Ability to maintain normal pulse oximetry readings will improve by discharge Outcome: Progressing   Problem: Self-Care: Goal:  Activities of Daily Living Performance Description: Ability to perform activities of daily living will improve by discharge Outcome: Progressing   Problem: Sensory: Goal: Comfort Level Description: General experience of comfort will improve by discharge Outcome: Progressing Goal: Identifies Factors That Increase Pain Description: Ability to identify factors that increase the pain will improve by discharge Outcome: Progressing Goal: Satisfaction With Pain Management Regimen Description: Satisfaction with pain management regimen will improve by discharge Outcome: Progressing   Problem: Skin Integrity: Goal: Skin Integrity Status Description: Skin integrity will improve by discharge Outcome: Progressing Goal: Demonstrates Proper Wound Care Description: Ability to demonstrate proper wound care will improve by discharge Outcome: Progressing   Problem: Tissue Perfusion: Goal: Risk of venous thrombosis will decrease by discharge Description: Risk of venous thrombosis will  decrease by discharge Outcome: Progressing   Problem: Urinary Elimination: Goal: Achieves and Maintains Adequate Urine Output Description: Ability to achieve and maintain adequate urine output will improve by discharge Outcome: Progressing   Problem: Pain Goal: Patient will identify the triggers to increased pain and available methods for controlling pain (LTG) Outcome: Progressing Goal: Patient will comply with medication and/or non-medication comfort measures (STG) Outcome: Progressing Goal: Patient will notify staff of comfort  measures that have been useful in the past (STG) Outcome: Progressing Goal: Patient will report any pain score less than or equal to the patient's identified level of pain (STG) Outcome: Progressing

## 2023-02-01 NOTE — Interval H&P Note (Signed)
 Pt seen and examined.  Complaining of increasing neck pain with bilateral upper extremities pain, numbness and weakness. Reviewed results of recent CT scan showing multilevel pseudarthrosis posterior cervical occipital-down to C5 with hardware failure.  Patient and family understand that we are only attempting to address the bottom 2 levels with this procedure.  May not be able to access C3-4.  If pain persist may still need revision posterior occipital cervical instrumentation and fusion.  Risk, benefits, alternatives discussed and pt and family expressed understanding.  All questions answered.

## 2023-02-01 NOTE — Anesthesia Preprocedure Evaluation (Signed)
 Patient: Becky Hobbs  Procedure Information     Date/Time: 02/01/23 1025   Procedure: ACDF C3-4 and C4-5 with Plate.   Illiac crest bone graft and allograft. (Neck)   Location: [facility] OR 10 / [facility] OR   Surgeons: Royden Elsie Schneider, MD       Relevant Problems  CARDIOVASCULAR  (+) Chronic migraine without aura without status migrainosus, not intractable  (+) Hypertension  (+) Migraine    ENDOCRINE  (+) Hypothyroidism    GU  (+) Acute-on-chronic kidney injury  (CMS/HCC)  (+) Hepatic cirrhosis (CMS/HCC)  (+) Stage 3 chronic kidney disease (CMS/HCC)    NEUROLOGY  (+) Chronic migraine without aura without status migrainosus, not intractable  (+) Migraine    RESPIRATORY SYSTEM  (+) Asthma    Other  (+) Arthritis    There were no vitals taken for this visit.   Clinical information reviewed:      Anesthesia Evaluation  PONV Predictive Score (Scale 0-5):  Apfel risk score: 0 Respiratory: Patient has asthma.  Cardiovascular: Patient has high blood pressure and hyperlipidemia.  Neurological: Patient has headaches.  Renal/Gastrointestinal: Patient has hepatitis.  Genitourinary: Patient has chronic renal insufficiency.  Endocrine: Patient has type 2 diabetes and hypothyroidism.     Physical Exam  Airway  Mallampati: II TM Distance (FB): 3 Oral Aperture (FB): 3 Neck ROM: limited ROM Cardiovascular - normal exam Rhythm: regular Rate: normal Dental  Comments: Multiple dental implants Pulmonary - normal exam breath sounds clear to auscultation   Anesthesia Plan  Review Preop documentation reviewed: H&P reviewed, NPO Status Reviewed, Preop Vitals Reviewed and Periop Tests and Results Reviewed  Plan ASA score: 3  Anesthesia type: general Induction: intravenous Post-op plan: Extubation in OR and PACU  Informed Consent Anesthetic plan and risks discussed with patient. Use of blood products discussed with patient whom consented to blood products. Plan  discussed with CRNA.

## 2023-02-01 NOTE — Progress Notes (Signed)
 Division of Pharmacy Services  Patient Name: Becky Hobbs  Age: 77 y.o.   [facility]  Allergies: Bee sting, Buprenorphine, Penicillins, Venom-honey bee, Hydrocodone , Erythromycin base, Hydrocodone  bitartrate, Ibuprofen, Trimethoprim , and Hydrocodone -acetaminophen   Admission History Admission Medication List was Completed  Medications removed from PTA medlist:  Reason:   Explanation:   Medication Documentation Review Audit     Reviewed by Glennis CHRISTELLA Mayotte, CPhT (Pharmacy Technician) on 02/01/23 at 1801  Med List Status: Pharm Tech complete         Reviewed by Nena VEAR Prescott, RN (Registered Nurse) on 02/01/23 at 1012  Med List Status: <None>           Audit from Redirected Encounters   **Prior to Admission medications have not yet been reviewed for this encounter**    Prior to Admission Medications     Reviewed by Glennis CHRISTELLA Mayotte, CPhT on 02/01/23 at 1801    Medication Sig Last Dose Informant Taking? Status  allopurinoL  (ZYLOPRIM ) 100 mg tablet Take 100 mg by mouth daily. 02/01/2023 0800  Yes Active         Med Note LYNWOOD, JEMIYA M   Mon Feb 01, 2023  3:39 PM) 01/04/2023 100 mg tab (disp 90, 90d supply)  ALPRAZolam  (XANAX ) 0.5 mg tablet Take 0.5 mg by mouth 2 (two) times a day as needed for anxiety. 01/31/2023  Yes Active         Med Note LYNWOOD, JEMIYA M   Mon Feb 01, 2023  3:39 PM) 12/10/2022 0.5 mg tab (disp 180, 90d supply)  baclofen  (LIORESAL ) 5 mg tablet Take 5 mg by mouth 2 (two) times a day. During day time-once or twice 02/01/2023 0800  Yes Active  cholecalciferol  (VITAMIN D3) 5,000 unit (125 mcg) tab tablet Take 5,000 Units by mouth Once Daily. 01/31/2023  Yes Active  coenzyme Q-10 200 mg capsule Take 200 mg by mouth Once Daily. 01/31/2023  Yes Active  famotidine (Pepcid) 20 mg tablet Take 20 mg by mouth 2 (two) times a day as needed for indigestion or heartburn. More than a month  No Active  fluticasone  furoate-vilanteroL (Breo Ellipta ) 100-25  mcg/dose dsdv inhaler Inhale 1 puff daily as needed (wheezing). More than a month  No Active  fluticasone  propionate (FLONASE ) 50 mcg/spray nasal spray Administer 2 sprays into each nostril 2 (two) times a day as needed for allergies. 01/31/2023  Yes Active  gabapentin  (NEURONTIN ) 300 mg capsule Take 600 mg by mouth every morning. 02/01/2023 0800  Yes Active         Med Note LYNWOOD, JEMIYA M   Mon Feb 01, 2023  3:39 PM) 01/31/2023 300 mg cap (disp 180, 90d supply)  hydrALAZINE  (APRESOLINE ) 25 mg tablet Take 25 mg by mouth every morning. 02/01/2023 0800  Yes Active         Med Note LYNWOOD, JEMIYA M   Mon Feb 01, 2023  5:49 PM) Unable to verify with [facility]   hydrOXYzine  (ATARAX ) 50 mg tablet Take 100 mg by mouth as needed for itching. Taking with Oxycodone  01/31/2023  Yes Active         Med Note LYNWOOD, JEMIYA M   Mon Feb 01, 2023  3:40 PM) 01/22/2023 50 mg tab (disp 60, 30d supply)  L. acidophilus/Bifid. animalis 32 billion cell cap Take 1 capsule by mouth Once Daily. 01/31/2023  Yes Active  loratadine  (CLARITIN ) 10 mg tablet Take 10 mg by mouth Once Daily. 01/31/2023  Yes Active  meToPROLol  succinate (ToPROL  XL)  200 mg 24 hr tablet Take 200 mg by mouth every morning. 02/01/2023 0800  Yes Active         Med Note LYNWOOD, JEMIYA M   Mon Feb 01, 2023  3:40 PM) 11/24/2022 200 mg Tb24 (disp 90, 90d supply)  mirabegron  (MYRBETRIQ ) 50 mg Tb24 Take 50 mg by mouth Once Daily Indications: overactive bladder. 01/31/2023  Yes Active         Med Note LYNWOOD, JEMIYA M   Mon Feb 01, 2023  6:00 PM) Unable to verify with [facility]   multivit-minerals/folic acid (CENTRUM ADULT 50 PLUS ORAL) Take by mouth daily. 01/31/2023  Yes Active  oxyCODONE -acetaminophen  (PERCOCET) 5-325 mg per tablet TAKE 1 TABLET BY MOUTH EVERY 8 TO 12 HOURS AS NEEDED FOR SEVERE PAIN 01/31/2023  Yes Active         Med Note LYNWOOD, JEMIYA M   Mon Feb 01, 2023  3:40 PM) 01/22/2023 5-325 mg tab (disp 30, 30d supply)     Ozempic 1 mg/dose (4 mg/3  mL) pnij 1 mg once a week. On Sundays/to hold dose 4/21 for surgery 02/01/23 01/24/2023  No Active         Med Note LYNWOOD, JEMIYA M   Mon Feb 01, 2023  3:41 PM) 12/14/2022 1 mg/dose (4 mg/3 mL) pnij (disp 9, 84d supply)  rosuvastatin  (CRESTOR ) 10 mg tablet Take 10 mg by mouth daily. 02/01/2023 0800  Yes Active         Med Note LYNWOOD, JEMIYA M   Mon Feb 01, 2023  3:41 PM) 12/19/2022 10 mg tab (disp 90, 90d supply)  SUMAtriptan  (IMITREX ) 100 mg tablet Take 100 mg by mouth once as needed for migraine. PRN  No Active  Synthroid  100 mcg tablet Take 100 mcg by mouth every morning. 02/01/2023 0800  Yes Active         Med Note LYNWOOD, JEMIYA M   Mon Feb 01, 2023  3:41 PM)   12/04/2022 100 mcg tab (disp 90, 90d supply)    TiZANidine  (ZANAFLEX ) 4 mg capsule Take 4 mg by mouth nightly. 01/30/2023  No Active  topiramate  (TOPAMAX ) 25 mg tablet Take 25 mg by mouth nightly as needed (migraines). Past Month  Yes Active         Med Note LYNWOOD GLENNIS HERO   Mon Feb 01, 2023  5:49 PM) Unable to verify with [facility]  torsemide  (DEMADEX ) 20 mg tablet Take 20 mg by mouth daily. 01/31/2023  Yes Active         Med Note LYNWOOD, JEMIYA M   Mon Feb 01, 2023  3:42 PM) 12/04/2022 20 mg tab (disp 90, 90d supply)  traZODone  (DESYREL ) 100 mg tablet Take 100 mg by mouth nightly as needed for sleep. 01/30/2023  No Active         Med Note LYNWOOD, JEMIYA M   Mon Feb 01, 2023  5:49 PM) Unable to verify with [facility]  vibegron  (Gemtesa ) 75 mg tab Take 1 tablet by mouth Once Daily Indications: an increased need to urinate often. 01/31/2023  Yes Active         Med Note LYNWOOD, JEMIYA M   Mon Feb 01, 2023  5:48 PM) Unable to verify with [facility]           Audit from Redirected Encounters   **Prior to Admission medications have not yet been reviewed for this encounter**     Patients' primary pharmacy/pharmacies [id number] - [place], [place] - [address]  AT [address] [address] - PHONE:  [phone] - FAX: [phone]   Confidence level in the medication list: Highly Confident  Comments: Patient interviewed by RN. Electronically signed by: Glennis CHRISTELLA Mayotte, CPhT 02/01/2023 6:01 PM  Electronically signed by: Glennis CHRISTELLA Mayotte, CPhT 02/01/2023 6:01 PM

## 2023-02-01 NOTE — Anesthesia Postprocedure Evaluation (Signed)
 Patient: Becky Hobbs  Procedure Summary     Date: 02/01/23 Room / Location: [facility] OR 10 / [facility] OR   Anesthesia Start: 1110 Anesthesia Stop: 1404   Procedure: ACDF C3-4 and C4-5 with Plate.   Illiac crest bone graft and allograft. 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  There were no known notable events for this encounter.  Anesthesia Post Evaluation  Final anesthesia type: general Patient location during evaluation: Bedside Patient participation: complete - patient participated Level of consciousness: awake and alert Pain score: 0 Pain management: adequate Post-op vital signs: post-procedure vital signs are stable Patient temperature: Normothermic Cardiovascular status: acceptable and hemodynamically stable Respiratory status: acceptable Hydration status: acceptable Post-op disposition: Inpatient Floor

## 2023-02-02 NOTE — Unmapped External Note (Signed)
 Physical Therapy Evaluation Co-eval with OT   Precautions: Precautions Other Therapy Precautions: Spinal, Fall risk, Lifting restrictions (SAGE protocol) Bracing: Soft cervical collar for comfort/OOB/ambulation Comment: Limit bending, no overhead reaching, no lifting more than 5 pounds, and limit twisting.  Gentle cervical AROM permitted, not to end range.   Medical Diagnosis/Course:  PT Diagnosis/Course Pertinent Medical Course: Patient is a 77 year old female admitted to [facility] 02/01/2023 for elective ACDF C3-5 by Dr. Gust.  Diagnosis:  1.  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  Mechanical failure of spinal instrumentation 3.  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 Anterior cervical discectomy and arthrodesis C3-4 and C4-5 with wide decompression of the thecal sac and nerve roots bilaterally. CPT [id number] and [id number]  2. Insertion Medtronic biomechanical titanium cage at C3-4 and at C4-5. CPT 22853 x 2 3. Anterior cervical instrumentation utilizing the Medtronic Zevo plating system C3 through C5. CPT 22845 4. Harvest of anterior iliac crest bone graft from the left hip through a separate skin and fascial incision. CPT 20937 5. Local autogenous bone graft supplemented with grafton allograft.  CPT  [id number], 20930   Surgeon: ROYDEN LELON GUST, M.D.  Assessment:  Pt is a 77 yo female admitted on 02/01/2023 for elective ACDF C3-5 as detailed above. Significant PMH includes: T10-L5 PSF 07/26/2017, fall 2012 01/30/2020 with subsequent C1 fracture and multiple surgical histories, migraines, HTN, pre-DM. At baseline pt reports ambulation with rollator as needed in the home with no use typically in the home although endorses furniture surfing, ambulates with cane versus rollator in community.  Patient is independent with ADLs/IADLs, medication management.  Sister  provides transportation.   A low complexity evaluation was completed this date. The patient presents with a decline from functional baseline with deficits noted in mildly impaired standing balance status, activity tolerance, pain, and decreased functional independence. Due to these deficits, the patient completes bed mobility with close supervision and verbal cues for appropriate logroll technique and rolling right and transition into sitting, complete sit to stand from bedside and commode during session with close supervision and RW, and ambulates 100 feet with RW with close supervision.  Patient demonstrates gait speed characterized by slowed speed generally with short step length and intermittent stoppages of gait which the patient endorses because if I move too fast I will fall. Pt deficits present as AMPAC Mobility score of 24/24 indicating 0% disability. Pt will benefit from skilled acute PT services to address deficits for improved safety with functional mobility.  Discussion held with patient in regards to potential benefit of home health PT services, with patient currently declining pleasantly.   Patient provided written education packet with education pertaining to spinal precautions, general education/advisement and maintenance of spinal precautions during completion of daily activity, and walking program.  In order to allow for interdisciplinary care coordination, patient safety, and discharge planning, PT Evaluation was performed in conjunction with Occupational Therapy.  Recommendations: Recommendation PT Recommendation: Home with 24 hour supervision, Home with intermittent assistance PT Equipment Recommended: None  PT Goals:  Encounter Problems     Encounter Problems (Active)     Physical Therapy     Bed mobility     Start:  02/02/23    Expected End:  02/23/23      Pt will be mod ind for all bed mobility with appropriate maintenance of spinal precautions with log roll  technique  to reduce risk of pressure ulcers, improve safety with functional mobility, and reduce caregiver burden prior to discharge.       Transfers     Start:  02/02/23    Expected End:  02/23/23      Pt will be mod ind for all transfers from various surface levels to improve overall safety with functional mobility and decrease caregiver burden.       Gait     Start:  02/02/23    Expected End:  02/23/23      Patient will ambulate 300 feet with LRAD and supervision with no loss of balance on level hallways to increase independence with gait and mobility tolerance to household/community level.       Education     Start:  02/02/23    Expected End:  02/23/23      Patient will be independent with recall of 4/4 spinal precautions and soft collar brace use/application to facilitate increased independence with healing promotion.        Car transfer/stair navigation     Start:  02/02/23    Expected End:  02/23/23      Patient will transfer to/from car simulator with distant supervision demonstrating appropriate adherence to spinal precautions, and navigate 3 stairs with single rail and CGA to improve independence with transportation to, and entry/exit from home.          Plan: Plan Planned Treatment/Interventions: Patient/Caregiver education, Therapeutic activity, Therapeutic exercise, Gait/mobility training PT Frequency: Daily  Subjective: Patient is agreeable to therapy session this date.  Patient Goal: No specific goals reported per patient.  Pain: Pain Assessment Pain Assessment: 0-10 Pain Score  : 4 Pain Type: Acute pain, Surgical pain Pain Location: Throat (4/10 incisional pain at rest, 7-8/10 with swallowing at throat)  Assessed in the Presence of: Leigh OT  Consent for therapy provided by: Nurse  Home Living:  Home Living Type of Home: House Home Layout: Multi-level Exterior Stairs-number: 2 to enter through the garage, no rails.  3 steps to enter the  front of the home, B rails-too wide to reach simultaneously.  Has a half bath on the main.  Patient reports that she can stay on the main level until she feels safe enough to shower.  Typically sleeps in a recliner on main level Actuary: 14 Interior Stairs-rails: Left going up [employer] / Tub: Furniture conservator/restorer: Airline pilot chair w/o back.  Plans on installing multiple grab bars in the shower.  Handicapped height toilets, one w/ a wall nearby and one with a sink in front for support. Home Equipment: Walker-rolling, Higher education careers adviser, Sports administrator, Sock aide [employer], long handled sponge) Additional Comments: Prior to admission, patient was independent with ADLs and IADLs although IADLs had become difficult due to pain.  Patient reports that she typically ambulated in the home without an assistive device but was furniture walking.  Patient reports use of rollator when to get to the bathroom quickly.  Patient was using a rollator versus [employer] for ambulation in the community.  Patient has groceries delivered or sister retreats them for her.  Patient was not driving and sister provides transportation needs.  Patient was in the reports sleeping in a recliner.  Prior Function:  Prior Function Level of Independence: Independent Lives With: Alone Person(s)  able to assist at discharge: Patient has a sister, x 2 sons, and neighbors that are available to assist upon discharge.  Patient reports that between her sons and sister,  she will have initial 24-hour supervision/assist if needed   Fall History Subjective Fall History Fall in the last year?: No Feel unsteady or off balance?: Yes   Past Medical History: Past Medical History:  Diagnosis Date  . Acute stress disorder   . Ambulates with cane   . Arthritis   . Asthma   . Chronic hepatitis C virus infection (CMS/HCC)   . Diabetes mellitus (CMS/HCC)   . Dyslipidemia   . Gout   . History  of transfusion   . Hypertension   . Hypothyroidism   . Postoperative ileus (CMS/HCC) Resolved: [id number]  . Stage 3 chronic kidney disease (CMS/HCC)    Past Surgical History: Past Surgical History:  Procedure Laterality Date  . APPENDECTOMY    . BLADDER REPAIR    . BREAST SURGERY Right    X 2 cyst removal  . BUNIONECTOMY Left   . CERVICAL FUSION N/A 02/01/2023   ACDF C3-4 and C4-5 with Plate.   Illiac crest bone graft and allograft. performed by Royden Elsie Schneider, MD at [facility] OR  . CERVICAL SPINE SURGERY  2020   at [facility]  . CHOLECYSTECTOMY    . COLONOSCOPY  2006  . CYST REMOVAL     left arm  . ESOPHAGOGASTRODUODENOSCOPY  04/2019  . GASTRIC FUNDOPLICATION    . LAPAROSCOPIC OVARIAN CYSTECTOMY    . LUMBAR FUSION     Dr Schneider MANIFOLD 2  . SPINE SURGERY  01/2020   Radiofrequency ablation  . TOTAL ABDOMINAL HYSTERECTOMY      General: Interdisciplinary Team Communication Communication: Patient, Clinical Case Management, Nursing Communication Details: Patient advised in DC recommendations. CM notified of DC recommendations and any needed DME. RN alerted to patients resting position and functional performance during evaluation.  Cognition:  Cognition Orientation Level: Oriented x4     Skin Assessment: Intact except surgical incision(s)  Range of Motion:  ROM Assessment Overall ROM Assessment: Within Functional Limits RLE: AROM WFL LLE: AROM WFL  Strength:  Strength Assessment Overall Strength Assessment: Within Functional Limits RLE Strength: Grossly 4+/5 LLE Strength: Grossly 4+/5     Sensation Numbness: BLE intact to light touch     Coordination Additional Comments: BLE heel-to-shin intact   Balance: Balance Sitting - Static: Independent Sitting - Dynamic: Independent Standing - Static: Distant supervision Standing - Dynamic: Close supervision Balance Additional Comments: Standing balance assessed using rolling walker  Lines and Leads: saline  lock  Mobility: Bed Mobility Rolling: Rolling - Right, Close supervision, Verbal Cues Supine to Sit: Close supervision, Verbal Cues Bed Mobility Additional Comments: Patient able to bed roll right with close supervision for safety with verbal cues for logrolling technique.  Patient performed side-lying right to sitting edge of bed with close supervision for safety with verbal cues for logrolling technique.  Patient able to scoot anteriorly towards edge of bed and also to the posterior while sitting in recliner with modified independence using bilateral upper extremities for support. Transfers Assistive Device: Walker-rolling Sit to Stand: Close supervision Stand to Sit: Close supervision Toilet Transfers - To: BSC, Grab Bars Toilet Transfers - Type:  (Ambulatory) Toilet Transfers - Level of Assistance: Close supervision Transfers  Additional Comments: Patient able to perform sit to stand from edge of bed with close supervision for safety using rolling walker.  Patient able to perform ambulatory toilet transfer using rolling walker, bedside commode over toilet, and grab bar with close supervision for safety with verbal cues for safe rolling walker positioning.  Patient sat in recliner chair  with close supervision for safety using rolling walker and bilateral upper extremities to control descent. Mobility Gait Distance (ft): 100 ft (Patient able to ambulate 100 feet using rolling walker with close supervision for safety) Gait Assistance: Close supervision Assistive Device: Walker-rolling PT Mobility Additional Comments: Patient navigates 4 stairs with use of bilateral railing ascending and descending with close supervision demonstrating nonreciprocal pattern in both directions.  Patient completes transfer to/from car simulator with close supervision and verbal cues for appropriate maintenance spinal precautions particularly with avoidance of overhead reaching/pulling.  AM-PAC 6 clicks: AM-PAC  6-Clicks - Basic Mobility - How much help is needed from another person when: Turning from your back to your side while in a flat bed without using bedrails?: None Moving from lying on your back to sitting on the side of a flat bed without using bedrails?: None Moving to and from a bed to a chair (including a wheelchair)?: None Standing up from a chair using your arms (e.g, wheelchair, or bedside chair)?: None To walk in hospital room?: None Climbing 3-5 steps with a railing?: None Raw Score: 24    Rehab Potential: Rehab Potential Rehab Potential: Good Impairments/Limitations: Mobility deficits, Transfer deficits, Bed mobility deficits, Ambulation Deficits, Balance Deficits, Activity Tolerance Deficits   Therapeutic Interventions: Please see mobility for details, extensive education provided to patient for maintenance of spinal precautions through course of mobility.  Condition After Therapy: Condition after Therapy: Up in chair, Nursing notified of condition, All needs within reach, Alarm activated, Lines intact, Fall interventions in place  Skill Performed by Clinician: Education on proper use of assistive device/adaptive equipment Education on precautions for safe performance of functional tasks Education on safe/proper technique Facilitation for proper positioning/movement in preparation for functional tasks Monitoring exercise/activity tolerance Monitoring safe progression of exercise/activity Tactile cues for correction of atypical postures or movements Tactile cues for proper technique Tactile cues for safe execution of functional tasks Verbal cues for proper technique Verbal cues for safe execution of functional tasks  Education:  Education provided to: Patient Education provided regarding:  Role and POC of PT, Call and wait for assistance, Discharge recs, Precautions, and Use of assistive device Response to Education: Patient verbalizes understanding, Patient  demonstrates understanding, and Patient needs continued reinforcement  Treatment Diagnosis: Other Abnormalities of Gait  Time In: 1030 Time Out: 1126 Time in Timed codes: 10 Total Treatment Time: 46 PT Eval Low Complexity minutes: 36   Treatment/Procedures Time Entry Therapeutic Activity minutes: 10    Charges           02/02/2023   Code Description Service Provider Modifiers Quantity  [id number] Hc Pt Therapeut Actvity Direct Pt Contact Each 15 Min Roberts JONETTA Molt, PT GP 1  [id number] Hc Pt Physical Therapy Evaluation Low Complex 20 Mins Roberts JONETTA Molt, PT GP 1        Time of Service Note Type Status  None            PT Evaluation Complexity History of Comorbidities/Personal Care Impacting Plan of Care: 1-2 personal factors and/or comorbidities Examination of Body Systems: 3 or more elements Presentation of Characteristics: Stable and/or uncomplicated characteristics PT Eval Complexity Score: 170 PT Eval Complexity Score: Low Complexity

## 2023-02-02 NOTE — Unmapped External Note (Addendum)
 Acute OT Evaluation  (OT/PT co-evaluation)    Precautions   Precautions Other Therapy Precautions: Spinal, Fall risk, Lifting restrictions (SAGE protocol) Bracing: Soft cervical collar for comfort/OOB/ambulation Comment: Limit bending, no overhead reaching, no lifting more than 5 pounds, and limit twisting.  Gentle cervical AROM permitted, not to end range.   OT Treatment Diagnosis:    Alteration in self care ability   Assessment Summary:   A low complexity occupational therapy evaluation was completed in conjunction with physical therapy to ensure appropriate discharge planning, patient safety, and interdisciplinary care coordination.  Patient is a 77 year old female admitted to the hospital on 4/22 due to:  Diagnosis:  1.  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  Mechanical failure of spinal instrumentation 3.  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  C3-4 and C4-5 foraminal stenosis  Patient is status post the following procedure by Dr. Gust on 4/22:  Procedure:  1. Anterior cervical discectomy and arthrodesis C3-4 and C4-5 with wide decompression of the thecal sac and nerve roots bilaterally. CPT [id number] and [id number]  2. Insertion Medtronic biomechanical titanium cage at C3-4 and at C4-5. CPT 22853 x 2 3. Anterior cervical instrumentation utilizing the Medtronic Zevo plating system C3 through C5. CPT 22845 4. Harvest of anterior iliac crest bone graft from the left hip through a separate skin and fascial incision. CPT 20937 5. Local autogenous bone graft supplemented with grafton allograft.  CPT  20936, 20930  Past medical history includes anxiety, acute on chronic kidney injury, cervical spondylosis, diabetes, gout, disease of the spinal cord, closed fracture of C1, acute stress disorder, chronic hep C, and multiple spinal surgeries including a T10-L5 PSF.  Please see below for further past  medical history information.  Prior to admission, patient was independent with ADLs and IADLs although patient reports ADLs have become increasingly difficult due to pain.  Patient was ambulatory with no assistive device in the house but does endorse furniture walking.  Patient also endorses use of rollator when attempting to get to the bathroom quickly.  Patient was using a rollator versus [employer] for ambulation in the community.  Patient was living alone and driving.  Currently, patient able to perform grooming with modified independence, upper body dressing with set up assist, toileting with modified independence, and functional transfers/gait with close supervision for safety using rolling walker.  Patient presents with deficits noted below in the problem list section that are impeding patient's ability to safely participate in ADLs and functional transfers.  Patient currently presenting with an AM PAC score of 21/24 indicating a 32.79% impairment in ADLs per impact 6 clicks Daily activity inpatient Short form.  No further skilled occupational therapy needs noted at this time as all needs addressed at evaluation and patient issued appropriate educational handouts.  OT to sign off.  Patient will have 24 hr sup upon d/c if needed.    OT Recommendation: OT Recommendation: Home with intermittent supervision   OT Equipment Recommended: OT Equipment Recommended: None (has needed DME in the home environment)   Goals: Encounter Problems     Encounter Problems (Active)     Occupational Therapy     Toileting (Adequate for Discharge)     Start:  02/02/23    Expected End:  02/03/23      Patient to perform toileting at Mod I level and toilet transfer at close sup level using adaptive equipment/DME as needed to allow patient to safely discharge to  least restrictive environment and reduce caregiver burden/fall risk  4/23-Mod I toileting, Close sup toilet t/f [facility])           Plan: Plan Patient  and Family Goals: to d/c home, to increase balance OT Frequency: One time visit OT Duration: Discharge   Rehab Potential: Rehab Potential Impairments/Limitations:  (spinal precautions)    PAST MEDICAL HISTORY:  Past Medical History:  Diagnosis Date  . Acute stress disorder   . Ambulates with cane   . Arthritis   . Asthma   . Chronic hepatitis C virus infection (CMS/HCC)   . Diabetes mellitus (CMS/HCC)   . Dyslipidemia   . Gout   . History of transfusion   . Hypertension   . Hypothyroidism   . Postoperative ileus (CMS/HCC) Resolved: [id number]  . Stage 3 chronic kidney disease (CMS/HCC)     PAST SURGICAL HISTORY:  Past Surgical History:  Procedure Laterality Date  . APPENDECTOMY    . BLADDER REPAIR    . BREAST SURGERY Right    X 2 cyst removal  . BUNIONECTOMY Left   . CERVICAL FUSION N/A 02/01/2023   ACDF C3-4 and C4-5 with Plate.   Illiac crest bone graft and allograft. performed by Royden Elsie Schneider, MD at [facility] OR  . CERVICAL SPINE SURGERY  2020   at [facility]  . CHOLECYSTECTOMY    . COLONOSCOPY  2006  . CYST REMOVAL     left arm  . ESOPHAGOGASTRODUODENOSCOPY  04/2019  . GASTRIC FUNDOPLICATION    . LAPAROSCOPIC OVARIAN CYSTECTOMY    . LUMBAR FUSION     Dr Schneider MANIFOLD 2  . SPINE SURGERY  01/2020   Radiofrequency ablation  . TOTAL ABDOMINAL HYSTERECTOMY         General:  General Family/Caregiver Details: no caregiver present for session Hand Dominance: Right   Subjective: Patient reports that her family can assist upon d/c if needed    Pain: Pain Assessment Pain Assessment: 0-10 Pain Score  : 8 Pain Type: Surgical pain, Acute pain Pain Location: Throat (4/10 incisional pain, 7-8/10 pain w/ swallowing)   Home Living  Home Living Type of Home: House Home Layout: Multi-level Exterior Stairs-number: 2 to enter through the garage, no rails.  3 steps to enter the front of the home, B rails-too wide to reach simultaneously.  Has a half bath on the main.   Patient reports that she can stay on the main level until she feels safe enough to shower.  Typically sleeps in a recliner on main level Shower / Tub: Furniture conservator/restorer: Airline pilot chair w/o back.  Plans on installing multiple grab bars in the shower.  Handicapped height toilets, one w/ a wall nearby and one with a sink in front for support. Home Equipment: Walker-rolling, Higher education careers adviser, Sports administrator, Sock aide [employer], long handled sponge) Additional Comments: Prior to admission, patient was independent with ADLs and IADLs although IADLs had become difficult due to pain.  Patient reports that she typically ambulated in the home without an assistive device but was furniture walking.  Patient reports use of rollator when to get to the bathroom quickly.  Patient was using a rollator versus [employer] for ambulation in the community.  Patient has groceries delivered or sister retreats them for her.  Patient was not driving and sister provides transportation needs.  Patient was in the reports sleeping in a recliner.   Prior Function  Prior Function Level of Independence: Independent Lives With: Alone Person(s)  able to assist at discharge: Patient has a sister, x 2 sons, and neighbors that are available to assist upon discharge.  Patient reports that between her sons and sister, she will have initial 24-hour supervision/assist if needed   Fall History: Subjective Fall History Fall in the last year?: No Feel unsteady or off balance?: Yes  Cognition  Cognition Orientation Level: Oriented x4 Overall Cognitive Status: Within Functional Limits Executive Functioning: Intact   Assessed in the Presence of: PT Stefan  Consent for therapy provided by: Nurse  Appearance: Lines/Leads: bed/chair exit alarm, soft cervical collar, and saline lock  Skin: Skin Integrity & Edema Skin: Intact except surgical incision(s) Edema: No edema    Vision/Perception and Hearing  Vision/Perception and Hearing  Current Vision: Wears glasses all the time Did patient use glasses during this session?: Yes Perception: Within Functional Limits Hearing: Within Functional Limits   ROM  ROM Assessment ROM Additional Comments: Bilateral shoulder flexion/abduction above 90 degrees deferred due to cervical precautions.  Patient able to achieve 90 degrees of active shoulder flexion bilaterally.  Bilateral active elbow, wrist, and digit range of motion within functional limits.  Strength  Strength Assessment Strength Comments: Formal manual muscle testing of the upper extremities deferred due to cervical precautions.  Grip strength within normal limits   Tone Assessment Tone Assessment: Within Functional Limits   Sensation Light Touch: No apparent deficits (Bilateral upper extremity) Numbness: Patient denies numbness/tingling        Coordination Additional Comments: Bilateral upper extremity coordionation WNL.  Patient able to oppose all digits    ADLs:   Self feeding skills-independent to drink from cup   Grooming-modified independent to stand at sink and wash hands.  Patient was educated on using a cup to rinse/spit in instead of flexing forward at the hips when performing oral care.   Bathing-patient was educated on sitting down to wash the lower extremities.  Patient was educated on having a mat inside and outside the shower for safety.  Patient was educated on not taking a shower until approved by the ortho MD and to not take tub baths.   UB dressing-set up assist to don posterior hospital gown and soft cervical collar while sitting edge of bed, gather data is for patient.  Patient was educated on not reaching above shoulder height when donning/doffing a shirt.  Patient educated on donning/doffing soft cervical collar and wear schedule.       LB dressing-patient unable to achieve figure-of-four position however reports that she  is well versed  with using reacher, sock aid, and shoe horn if needed for lower body dressing tasks.   Toileting-patient was educated on limiting twisting when performing hygiene after a bowel movement to maintain cervical precautions.  Patient was educated on using a bedside commode over the toilet to reduce amount of squatting/stooping when using the bathroom.  Modified independent for hygiene pants down/up using grab bar and rolling walker for support.  IADLs-Not tested.  Patient was educated on using a reacher to assist with laundry tasks if needed.  Patient was also educated on using a reacher to retrieve items from the floor.  Patient was also educated on having a friend or family member assist with IADLs in the home.  Patient was educated on not reaching overhead for items in cabinets and to use reacher if object is light enough to do so.  Patient was educated on having someone move frequently used items to countertop or to lower cabinets.  Patient issued handout regarding  how to perform IADLs safely in the home.     AM-PAC - Daily Activity: AM-PAC 6-Clicks - Daily Activity Lower Body Clothing Activity: A little Bathing: A little Toileting: None Upper Body Clothing Activity: A little Personal Grooming: None Eating Meals: None AM-PAC Total Score: 21  Balance  Balance Sitting - Static: Independent Sitting - Dynamic: Independent Standing - Static: Distant supervision Standing - Dynamic: Close supervision Balance Additional Comments: Standing balance assessed using rolling walker   Bed Mobility  Bed Mobility Rolling: Rolling - Right, Close supervision, Verbal Cues Supine to Sit: Close supervision, Verbal Cues Bed Mobility Additional Comments: Patient able to bed roll right with close supervision for safety with verbal cues for logrolling technique.  Patient performed side-lying right to sitting edge of bed with close supervision for safety with verbal cues for logrolling technique.   Patient able to scoot anteriorly towards edge of bed and also to the posterior while sitting in recliner with modified independence using bilateral upper extremities for support.   Transfers: Transfers Assistive Device: Walker-rolling Sit to Stand: Close supervision Stand to Sit: Close supervision Toilet Transfers - To: BSC, Therapist, music Transfers - Type:  (Ambulatory) Toilet Transfers - Level of Assistance: Close supervision Transfers  Additional Comments: Patient able to perform sit to stand from edge of bed with close supervision for safety using rolling walker.  Patient able to perform ambulatory toilet transfer using rolling walker, bedside commode over toilet, and grab bar with close supervision for safety with verbal cues for safe rolling walker positioning.  Patient sat in recliner chair with close supervision for safety using rolling walker and bilateral upper extremities to control descent.   Mobility: Mobility Gait Distance (ft): 100 ft (Patient able to ambulate 100 feet using rolling walker with close supervision for safety) Gait Assistance: Close supervision Assistive Device: Walker-rolling   Therapy Activity Vitals BP 108/56  taken by RN at start of session     Condition After Therapy: Condition After Therapy Condition After Therapy: Up in chair, Nursing notified of condition, All needs within reach, Alarm activated, Lines intact, Fall interventions in place    Education:  Education provided to: Patient Education provided regarding:  Role and POC of OT, Discharge recs, Precautions, Fall Prevention, and Use of assistive device.  Patient educated on cervical precautions, log rolling technique, resting positions, donning/doffing cervical collar, soft collar wear schedule, maintaining cervical precautions during upper body dressing/lower body dressing/toileting, and patient issued handout.  Patient educated on use of bedside commode over toilet and shower chair in the home  environment. Response to Education: Patient verbalizes understanding  Treatment Provided: See occupational performance section    Time In: 1030 Time Out: 1126 Time in Timed codes: 10 Total Treatment Time: 46 OT Eval Low Complexity minutes: 36 (36 mins OT/PT co-evaluation, 10 mins OT treat, 10 mins PT treat)   Treatment/Procedures Time Entry Self Care/Home Management Training minutes: 10    Charges           02/02/2023   Code Description Service Provider Modifiers Quantity  [id number] Hc Ot Self-Care/Home Mgmt Training Each 15 Minutes Leigh A Hinerman, OT/L GO 1  [id number] Hc Ot Occupational Therapy Eval Low Complex 30 Mins Leigh A Hinerman, OT/L GO 1        Time of Service Note Type Status  None            OT Evaluation Complexity Occupational Profile/Medical and Therapy History: Brief history including review of medical and/or therapy records relating to  the presenting problem Therapy Evaluation Assessment: 1-3 performance deficits relating to physical, cognitive, or psychosocial limitations/restrictions Clinical Decision Making: Low complexity, limited amount of treatment options, no assessment modification, no comorbidities Evaluation Complexity Score: 450 OT Eval Complexity Score: Low Complexity

## 2023-02-02 NOTE — Discharge Summary (Signed)
 Spine Discharge Summary   Admit date: 02/01/2023  Discharge date: 02/02/23  Discharge to: Home  Discharge Service: Orthopedic Spine  Discharge Attending Physician: Royden Elsie Schneider, MD  Discharge  Diagnosis: Procedure(s): ACDF C3-4 and C4-5 with Plate.   Illiac crest bone graft and allograft.  Secondary Diagnoses: Principal Problem:   Pseudarthrosis after fusion or arthrodesis Active Problems:   Spinal stenosis, cervical region   Alteration in self-care ability   Abnormality of gait and mobility   OR Procedures: Procedure(s): ACDF C3-4 and C4-5 with Plate.   Illiac crest bone graft and allograft.  Discharge Day Services:  The patient was seen and evaluated by the Ortho Spine team on the day of discharge and deemed stable for discharge, including stable labs, vital signs, radiographic studies, and neurologic exam.  Discharge instructions were given and explained.  Questions were answered.  Physical Examination:  General Appearance:  alert, appears stated age, and cooperative Extremities:   extremities normal, atraumatic, no cyanosis or edema Pulses:   2+ and symmetric Neurologic:   Grossly normal   Hospital Course: PT underwent the above procedure without complications. Patient was transferred to PACU in stable condition and then to [address] [place]. Orthopedic spine protocol was followed. Pain is well controlled with current medications. Will dc home today after up with PT/OT x1. DME rec per PT/OT.    Condition at Discharge: Improved   Discharge Medications:    Medication List     START taking these medications    oxyCODONE -acetaminophen  10-325 mg per tablet Commonly known as: PERCOCET Take 1 tablet by mouth every 4 (four) hours as needed for severe pain (7-10). Replaces: oxyCODONE -acetaminophen  5-325 mg per tablet       CONTINUE taking these medications    allopurinoL  100 mg tablet Commonly known as: ZYLOPRIM  Take 100 mg by mouth daily.   ALPRAZolam  0.5 mg  tablet Commonly known as: XANAX  Take 0.5 mg by mouth 2 (two) times a day as needed for anxiety.   baclofen  5 mg tablet Commonly known as: LIORESAL  Take 5 mg by mouth 2 (two) times a day. During day time-once or twice   Breo Ellipta  100-25 mcg/dose Dsdv inhaler Generic drug: fluticasone  furoate-vilanteroL Inhale 1 puff daily as needed (wheezing).   cholecalciferol  5,000 unit (125 mcg) Tab tablet Commonly known as: VITAMIN D3 Take 5,000 Units by mouth Once Daily.   coenzyme Q-10 200 mg capsule Take 200 mg by mouth Once Daily.   fluticasone  propionate 50 mcg/spray nasal spray Commonly known as: FLONASE  Administer 2 sprays into each nostril 2 (two) times a day as needed for allergies.   gabapentin  300 mg capsule Commonly known as: NEURONTIN  Take 600 mg by mouth every morning.   Gemtesa  75 mg Tab Generic drug: vibegron  Take 1 tablet by mouth Once Daily Indications: an increased need to urinate often.   hydrALAZINE  25 mg tablet Commonly known as: APRESOLINE  Take 25 mg by mouth every morning.   hydrOXYzine  50 mg tablet Commonly known as: ATARAX  Take 100 mg by mouth as needed for itching. Taking with Oxycodone    L. acidophilus/Bifid. animalis 32 billion cell Cap Take 1 capsule by mouth Once Daily.   loratadine  10 mg tablet Commonly known as: CLARITIN  Take 10 mg by mouth Once Daily.   metoprolol  succinate 200 mg 24 hr tablet Commonly known as: TOPROL  XL Take 200 mg by mouth every morning.   mirabegron  50 mg Tb24 Commonly known as: MYRBETRIQ  Take 50 mg by mouth Once Daily Indications: overactive bladder.  Ozempic 1 mg/dose (4 mg/3 mL) Pnij Generic drug: semaglutide 1 mg once a week. On Sundays/to hold dose 4/21 for surgery 02/01/23   Pepcid 20 mg tablet Generic drug: famotidine Take 20 mg by mouth 2 (two) times a day as needed for indigestion or heartburn.   rosuvastatin  10 mg tablet Commonly known as: CRESTOR  Take 10 mg by mouth daily.   SUMAtriptan  100 mg  tablet Commonly known as: IMITREX  Take 100 mg by mouth once as needed for migraine.   Synthroid  100 mcg tablet Generic drug: levothyroxine  Take 100 mcg by mouth every morning.   tiZANidine  4 mg capsule Commonly known as: ZANAFLEX  Take 4 mg by mouth nightly.   topiramate  25 mg tablet Commonly known as: TOPAMAX  Take 25 mg by mouth nightly as needed (migraines).   torsemide  20 mg tablet Commonly known as: DEMADEX  Take 20 mg by mouth daily.   traZODone  100 mg tablet Commonly known as: DESYREL  Take 100 mg by mouth nightly as needed for sleep.       STOP taking these medications    CENTRUM ADULT 50 PLUS ORAL   oxyCODONE -acetaminophen  5-325 mg per tablet Commonly known as: PERCOCET Replaced by: oxyCODONE -acetaminophen  10-325 mg per tablet         Where to Get Your Medications     Information about where to get these medications is not yet available   Ask your nurse or doctor about these medications oxyCODONE -acetaminophen  10-325 mg per tablet       Kristyn Heagen PA-C

## 2023-02-02 NOTE — Progress Notes (Signed)
 Case Management Update  Date: 02/02/2023   Time: 12:38 PM   Patient Type: Observation  Patient admitted for:   Diagnosis:  1.  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  Mechanical failure of spinal instrumentation 3.  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  C3-4 and C4-5 foraminal stenosis   Procedure:  1. Anterior cervical discectomy and arthrodesis C3-4 and C4-5 with wide decompression of the thecal sac and nerve roots bilaterally.  2. Insertion Medtronic biomechanical titanium cage at C3-4 and at C4-5.  3. Anterior cervical instrumentation utilizing the Medtronic Zevo plating system C3 through C5.  4. Harvest of anterior iliac crest bone graft from the left hip through a separate skin and fascial incision.  5. Local autogenous bone graft supplemented with grafton allograft.      Plan:   -monitoring VS, labs, surgical dressing -pain management -PT/OT completed, no needs identified   CM will continue to follow  Suzen MARLA Null, RN  BSN, Care Manager, Office #: [phone], Mobile #: [phone]

## 2023-02-10 ENCOUNTER — Ambulatory Visit: Payer: Medicare Other | Admitting: Physical Medicine and Rehabilitation

## 2023-06-07 ENCOUNTER — Ambulatory Visit: Payer: Medicare Other | Admitting: Physical Therapy

## 2023-06-10 ENCOUNTER — Encounter: Payer: Self-pay | Admitting: Physical Therapy

## 2023-06-10 ENCOUNTER — Ambulatory Visit: Payer: Medicare Other | Attending: Orthopaedic Surgery | Admitting: Physical Therapy

## 2023-06-10 DIAGNOSIS — M6281 Muscle weakness (generalized): Secondary | ICD-10-CM | POA: Diagnosis present

## 2023-06-10 DIAGNOSIS — R2681 Unsteadiness on feet: Secondary | ICD-10-CM | POA: Insufficient documentation

## 2023-06-10 DIAGNOSIS — R29898 Other symptoms and signs involving the musculoskeletal system: Secondary | ICD-10-CM | POA: Diagnosis present

## 2023-06-10 DIAGNOSIS — M542 Cervicalgia: Secondary | ICD-10-CM | POA: Diagnosis present

## 2023-06-10 DIAGNOSIS — R262 Difficulty in walking, not elsewhere classified: Secondary | ICD-10-CM | POA: Diagnosis present
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
## 2023-06-15 ENCOUNTER — Encounter: Payer: Self-pay | Admitting: Physical Therapy

## 2023-06-15 NOTE — Therapy (Signed)
Re-texted [employer] HEP per patient request (used mobile number available in Epic)  Nedra Hai, PT, DPT 06/15/23 3:27 PM

## 2023-06-15 NOTE — Telephone Encounter (Signed)
 Pt missed her appt and would like to reschedule

## 2023-06-22 ENCOUNTER — Encounter: Payer: Self-pay | Admitting: Physical Therapy

## 2023-06-22 ENCOUNTER — Ambulatory Visit: Payer: Medicare Other | Admitting: Physical Therapy

## 2023-06-22 NOTE — Therapy (Signed)
Per referring surgeon's office- OK for activity as tolerated in PT, no ROM or lifting restrictions. Progress lifting slowly and reduce load if painful.  Nedra Hai, PT, DPT 06/22/23 11:45 AM

## 2023-06-22 NOTE — Therapy (Signed)
Reached out to referring surgeon regarding any current precautions/lifting restriction/etc following second cervical spine surgery, awaiting response   Nedra Hai, PT, DPT 06/22/23 11:22 AM

## 2023-06-24 ENCOUNTER — Encounter: Payer: Self-pay | Admitting: Physical Therapy

## 2023-06-24 ENCOUNTER — Ambulatory Visit: Payer: Medicare Other | Attending: Orthopaedic Surgery | Admitting: Physical Therapy

## 2023-06-24 DIAGNOSIS — M5412 Radiculopathy, cervical region: Secondary | ICD-10-CM | POA: Insufficient documentation

## 2023-06-24 DIAGNOSIS — M542 Cervicalgia: Secondary | ICD-10-CM | POA: Insufficient documentation

## 2023-06-24 DIAGNOSIS — S12000A Unspecified displaced fracture of first cervical vertebra, initial encounter for closed fracture: Secondary | ICD-10-CM | POA: Insufficient documentation

## 2023-06-24 DIAGNOSIS — R2681 Unsteadiness on feet: Secondary | ICD-10-CM | POA: Insufficient documentation

## 2023-06-24 DIAGNOSIS — M6281 Muscle weakness (generalized): Secondary | ICD-10-CM | POA: Diagnosis present
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
## 2023-06-29 ENCOUNTER — Ambulatory Visit: Payer: Medicare Other | Admitting: Physical Therapy

## 2023-06-29 ENCOUNTER — Encounter: Payer: Self-pay | Admitting: Physical Therapy

## 2023-06-29 DIAGNOSIS — M6281 Muscle weakness (generalized): Secondary | ICD-10-CM | POA: Diagnosis not present

## 2023-06-29 DIAGNOSIS — M542 Cervicalgia: Secondary | ICD-10-CM

## 2023-06-29 DIAGNOSIS — M5412 Radiculopathy, cervical region: Secondary | ICD-10-CM

## 2023-06-29 DIAGNOSIS — S12000A Unspecified displaced fracture of first cervical vertebra, initial encounter for closed fracture: Secondary | ICD-10-CM
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
## 2023-07-01 ENCOUNTER — Ambulatory Visit: Payer: Medicare Other | Admitting: Physical Therapy

## 2023-07-01 ENCOUNTER — Encounter: Payer: Self-pay | Admitting: Physical Therapy

## 2023-07-01 DIAGNOSIS — M5412 Radiculopathy, cervical region: Secondary | ICD-10-CM

## 2023-07-01 DIAGNOSIS — M6281 Muscle weakness (generalized): Secondary | ICD-10-CM | POA: Diagnosis not present

## 2023-07-01 DIAGNOSIS — M542 Cervicalgia: Secondary | ICD-10-CM
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
## 2023-07-06 ENCOUNTER — Ambulatory Visit: Payer: Medicare Other | Admitting: Physical Therapy

## 2023-07-06 ENCOUNTER — Encounter: Payer: Self-pay | Admitting: Physical Therapy

## 2023-07-06 DIAGNOSIS — M542 Cervicalgia: Secondary | ICD-10-CM

## 2023-07-06 DIAGNOSIS — M5412 Radiculopathy, cervical region: Secondary | ICD-10-CM

## 2023-07-06 DIAGNOSIS — S12000A Unspecified displaced fracture of first cervical vertebra, initial encounter for closed fracture: Secondary | ICD-10-CM

## 2023-07-06 DIAGNOSIS — M6281 Muscle weakness (generalized): Secondary | ICD-10-CM | POA: Diagnosis not present
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
## 2023-07-08 ENCOUNTER — Ambulatory Visit: Payer: Medicare Other | Admitting: Physical Therapy

## 2023-07-08 ENCOUNTER — Encounter: Payer: Self-pay | Admitting: Physical Therapy

## 2023-07-08 DIAGNOSIS — R2681 Unsteadiness on feet: Secondary | ICD-10-CM

## 2023-07-08 DIAGNOSIS — M6281 Muscle weakness (generalized): Secondary | ICD-10-CM

## 2023-07-08 DIAGNOSIS — S12000A Unspecified displaced fracture of first cervical vertebra, initial encounter for closed fracture: Secondary | ICD-10-CM

## 2023-07-08 DIAGNOSIS — M5412 Radiculopathy, cervical region: Secondary | ICD-10-CM

## 2023-07-08 DIAGNOSIS — M542 Cervicalgia: Secondary | ICD-10-CM
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
## 2023-07-12 ENCOUNTER — Other Ambulatory Visit: Payer: Self-pay | Admitting: Internal Medicine

## 2023-07-12 DIAGNOSIS — Z1231 Encounter for screening mammogram for malignant neoplasm of breast: Secondary | ICD-10-CM

## 2023-07-15 ENCOUNTER — Ambulatory Visit: Payer: Medicare Other | Admitting: Physical Therapy

## 2023-07-19 NOTE — Telephone Encounter (Signed)
 Pt would like to reschedule her appt she has for today

## 2023-07-20 ENCOUNTER — Ambulatory Visit: Payer: Medicare Other | Attending: Physical Medicine and Rehabilitation | Admitting: Physical Therapy

## 2023-07-20 ENCOUNTER — Encounter: Payer: Self-pay | Admitting: Physical Therapy

## 2023-07-20 DIAGNOSIS — R262 Difficulty in walking, not elsewhere classified: Secondary | ICD-10-CM | POA: Insufficient documentation

## 2023-07-20 DIAGNOSIS — M5412 Radiculopathy, cervical region: Secondary | ICD-10-CM | POA: Diagnosis present

## 2023-07-20 DIAGNOSIS — M542 Cervicalgia: Secondary | ICD-10-CM | POA: Insufficient documentation

## 2023-07-20 DIAGNOSIS — R2681 Unsteadiness on feet: Secondary | ICD-10-CM | POA: Insufficient documentation

## 2023-07-20 DIAGNOSIS — M6281 Muscle weakness (generalized): Secondary | ICD-10-CM | POA: Insufficient documentation
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
## 2023-07-22 ENCOUNTER — Ambulatory Visit: Payer: Medicare Other | Admitting: Physical Therapy

## 2023-07-22 ENCOUNTER — Encounter: Payer: Self-pay | Admitting: Physical Therapy

## 2023-07-22 DIAGNOSIS — M542 Cervicalgia: Secondary | ICD-10-CM

## 2023-07-22 DIAGNOSIS — M5412 Radiculopathy, cervical region: Secondary | ICD-10-CM

## 2023-07-22 DIAGNOSIS — R2681 Unsteadiness on feet: Secondary | ICD-10-CM

## 2023-07-22 DIAGNOSIS — M6281 Muscle weakness (generalized): Secondary | ICD-10-CM

## 2023-07-22 DIAGNOSIS — R262 Difficulty in walking, not elsewhere classified: Secondary | ICD-10-CM
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
## 2023-07-27 ENCOUNTER — Ambulatory Visit: Payer: Medicare Other | Admitting: Physical Therapy

## 2023-07-29 ENCOUNTER — Ambulatory Visit: Payer: Medicare Other | Admitting: Physical Therapy

## 2023-07-29 ENCOUNTER — Encounter: Payer: Self-pay | Admitting: Physical Therapy

## 2023-07-29 DIAGNOSIS — R262 Difficulty in walking, not elsewhere classified: Secondary | ICD-10-CM

## 2023-07-29 DIAGNOSIS — M6281 Muscle weakness (generalized): Secondary | ICD-10-CM

## 2023-07-29 DIAGNOSIS — M542 Cervicalgia: Secondary | ICD-10-CM

## 2023-07-29 DIAGNOSIS — M5412 Radiculopathy, cervical region: Secondary | ICD-10-CM

## 2023-07-29 DIAGNOSIS — R2681 Unsteadiness on feet: Secondary | ICD-10-CM
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
## 2023-08-03 ENCOUNTER — Ambulatory Visit: Payer: Medicare Other | Admitting: Physical Therapy

## 2023-08-03 ENCOUNTER — Encounter: Payer: Self-pay | Admitting: Physical Therapy

## 2023-08-03 ENCOUNTER — Ambulatory Visit
Admission: RE | Admit: 2023-08-03 | Discharge: 2023-08-03 | Disposition: A | Discharge status: Home / Self Care | Payer: Medicare Other | Source: Ambulatory Visit | Attending: Internal Medicine | Admitting: Internal Medicine

## 2023-08-03 DIAGNOSIS — M542 Cervicalgia: Secondary | ICD-10-CM

## 2023-08-03 DIAGNOSIS — R2681 Unsteadiness on feet: Secondary | ICD-10-CM

## 2023-08-03 DIAGNOSIS — M5412 Radiculopathy, cervical region: Secondary | ICD-10-CM

## 2023-08-03 DIAGNOSIS — R262 Difficulty in walking, not elsewhere classified: Secondary | ICD-10-CM

## 2023-08-03 DIAGNOSIS — M6281 Muscle weakness (generalized): Secondary | ICD-10-CM | POA: Diagnosis not present

## 2023-08-03 DIAGNOSIS — Z1231 Encounter for screening mammogram for malignant neoplasm of breast: Secondary | ICD-10-CM
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
## 2023-08-05 ENCOUNTER — Ambulatory Visit: Payer: Medicare Other | Admitting: Physical Therapy

## 2023-08-05 ENCOUNTER — Other Ambulatory Visit: Payer: Self-pay | Admitting: Internal Medicine

## 2023-08-05 ENCOUNTER — Encounter: Payer: Self-pay | Admitting: Physical Therapy

## 2023-08-05 DIAGNOSIS — R262 Difficulty in walking, not elsewhere classified: Secondary | ICD-10-CM

## 2023-08-05 DIAGNOSIS — M6281 Muscle weakness (generalized): Secondary | ICD-10-CM

## 2023-08-05 DIAGNOSIS — M542 Cervicalgia: Secondary | ICD-10-CM

## 2023-08-05 DIAGNOSIS — M5412 Radiculopathy, cervical region: Secondary | ICD-10-CM

## 2023-08-05 DIAGNOSIS — R2681 Unsteadiness on feet: Secondary | ICD-10-CM

## 2023-08-05 DIAGNOSIS — N63 Unspecified lump in unspecified breast: Secondary | ICD-10-CM
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
## 2023-08-12 ENCOUNTER — Encounter: Payer: Self-pay | Admitting: Physical Therapy

## 2023-08-12 ENCOUNTER — Ambulatory Visit: Payer: Medicare Other | Admitting: Physical Therapy

## 2023-08-12 DIAGNOSIS — R2681 Unsteadiness on feet: Secondary | ICD-10-CM

## 2023-08-12 DIAGNOSIS — M5412 Radiculopathy, cervical region: Secondary | ICD-10-CM

## 2023-08-12 DIAGNOSIS — M6281 Muscle weakness (generalized): Secondary | ICD-10-CM | POA: Diagnosis not present

## 2023-08-12 DIAGNOSIS — R262 Difficulty in walking, not elsewhere classified: Secondary | ICD-10-CM

## 2023-08-12 DIAGNOSIS — M542 Cervicalgia: Secondary | ICD-10-CM
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
## 2023-08-25 ENCOUNTER — Encounter: Payer: Self-pay | Admitting: Physical Therapy

## 2023-08-25 ENCOUNTER — Ambulatory Visit: Payer: Medicare Other | Attending: Physical Medicine and Rehabilitation | Admitting: Physical Therapy

## 2023-08-25 DIAGNOSIS — M5412 Radiculopathy, cervical region: Secondary | ICD-10-CM | POA: Insufficient documentation

## 2023-08-25 DIAGNOSIS — R2681 Unsteadiness on feet: Secondary | ICD-10-CM | POA: Diagnosis present

## 2023-08-25 DIAGNOSIS — R262 Difficulty in walking, not elsewhere classified: Secondary | ICD-10-CM | POA: Insufficient documentation

## 2023-08-25 DIAGNOSIS — M542 Cervicalgia: Secondary | ICD-10-CM | POA: Diagnosis present

## 2023-08-25 DIAGNOSIS — M6281 Muscle weakness (generalized): Secondary | ICD-10-CM | POA: Diagnosis present

## 2023-08-25 NOTE — Therapy (Signed)
OUTPATIENT PHYSICAL THERAPY CERVICAL TREATMENT/RECERT         Patient Name: Becky Hobbs MRN: [id number] DOB:1946-07-30, 77 y.o., female Today's Date: 08/25/2023  END OF SESSION:  PT End of Session - 08/25/23 1544     Visit Number 13    Number of Visits 19    Date for PT Re-Evaluation 09/22/23    Authorization Type [employer] MCR    Authorization Time Period 06/10/23 to 95/62/13; cert extended 08/25/23    Progress Note Due on Visit 20    PT Start Time 1520    PT Stop Time 1558    PT Time Calculation (min) 38 min    Activity Tolerance Patient tolerated treatment well    Behavior During Therapy WFL for tasks assessed/performed             Past Medical History:  Diagnosis Date   Anemia    Anxiety    panic attacks   Arthritis    Asthma    Blood transfusion    1973--with twins birth   Bronchitis    Chronic kidney disease    Diabetes mellitus    borderline   GERD (gastroesophageal reflux disease)    H/O hiatal hernia    surgery fixed   Headache    migraines   Heart murmur    MVP   Hepatitis    Hep C   Hypertension    Hypothyroidism    Pneumonia    Past Surgical History:  Procedure Laterality Date   ABDOMINAL HYSTERECTOMY     APPENDECTOMY     APPLICATION OF INTRAOPERATIVE CT SCAN N/A 06/05/2021   Procedure: APPLICATION OF INTRAOPERATIVE CT SCAN;  Surgeon: Bethann Goo, DO;  Location: [facility] OR;  Service: Neurosurgery;  Laterality: N/A;   BACK SURGERY     bladder tack     BREAST CYST EXCISION Right    BREAST CYST INCISION AND DRAINAGE     BREAST SURGERY     BUNIONECTOMY     CHOLECYSTECTOMY     DILATION AND CURETTAGE OF UTERUS     ESOPHAGOGASTRODUODENOSCOPY (EGD) WITH PROPOFOL N/A 01/03/2019   Procedure: ESOPHAGOGASTRODUODENOSCOPY (EGD) WITH PROPOFOL;  Surgeon: Kathi Der, MD;  Location: [facility];  Service: Gastroenterology;  Laterality: N/A;   FOREIGN BODY REMOVAL  01/03/2019   Procedure: FOREIGN BODY REMOVAL;  Surgeon: Kathi Der, MD;   Location: [facility];  Service: Gastroenterology;;   FRACTURE SURGERY     left wrist   HERNIA REPAIR     hiatal hernia   LUMBAR FUSION  07/26/2017   LUMBAR LAMINECTOMY  03/04/2012   Procedure: MICRODISCECTOMY LUMBAR LAMINECTOMY;  Surgeon: Kerrin Champagne, MD;  Location: [facility] OR;  Service: Orthopedics;  Laterality: N/A;  L3-4 central laminectomy   NASAL SEPTUM SURGERY     nissan fundoplication     OVARIAN CYST SURGERY     POSTERIOR CERVICAL FUSION/FORAMINOTOMY N/A 06/05/2021   Procedure: OCCIPITAL- CERVICAL TWO INSTRUMENTATION AND FUSION; EXTENSION TO CERVICAL FIVE;  Surgeon: Bethann Goo, DO;  Location: [facility] OR;  Service: Neurosurgery;  Laterality: N/A;   THORACIC FUSION  07/26/2017   TONSILLECTOMY     Patient Active Problem List   Diagnosis Date Noted   Chronic incomplete quadriplegia (HCC) 08/12/2022   Spasticity 08/12/2022   Chronic kidney disease (CKD), stage IV (severe) (HCC) 08/12/2022   Neurogenic bladder 04/03/2022   Spondylosis, cervical, with myelopathy 04/03/2022   Myofascial pain dysfunction syndrome 12/17/2021   Hepatic cirrhosis (HCC) 06/26/2021   Fracture of C1 vertebra,  closed (HCC) 06/09/2021   Closed C1 fracture (HCC) 06/05/2021   Plantar fasciitis of left foot 08/21/2018   Acute renal failure (ARF) (HCC) 02/09/2018   Chronic migraine without aura without status migrainosus, not intractable 06/16/2017   History of lumbar fusion 08/03/2016    Class: Chronic   Migraine 04/09/2016   Acute-on-chronic kidney injury (HCC) 09/07/2015   Hypertension 09/07/2015   Hypothyroidism 09/07/2015   Gout 09/07/2015   Low back pain 09/07/2015   Obesity (BMI 30-39.9) 07/06/2013   Headache 04/12/2013   Concussion 04/12/2013   Lumbar spinal stenosis 03/04/2012    Class: Diagnosis of    PCP: Genice Rouge MD   REFERRING PROVIDER: Patricia Nettle, MD  REFERRING DIAG: Cervical Fusion  THERAPY DIAG:  Muscle weakness (generalized)  Radiculopathy, cervical  region  Cervicalgia  Unsteadiness on feet  Difficulty in walking, not elsewhere classified  Rationale for Evaluation and Treatment: Rehabilitation  ONSET DATE: cervical fusion 02/01/23  SUBJECTIVE:                                                                                                                                                                                                         SUBJECTIVE STATEMENT:  Doing OK, my good girlfriend died and I was able to watch the service via streaming. Neck is still feeling weak, I feel like I have to hold my head up. Balance is still off. Feeling fatigued a lot. Can't do but so much, have to take a lot of breaks.   Hand dominance: Right  PERTINENT HISTORY:  Anxiety/panic attacks, OA, CKD, DM, hernia s/p surgical repair, hepatitis, HTN, hypothyroidism, back surgery, bunionectomy, cholecystectomy, L wrist fracture surgery, lumbar fusion, lumbar laminectomy, posterior cervical fusion/foraminotomy, ACDF 4/24  PAIN:  Are you having pain? No 0/10 now   PRECAUTIONS: activity as tolerated per evaluating PT discussion with surgeon   RED FLAGS: None     WEIGHT BEARING RESTRICTIONS: No  FALLS:  Has patient fallen in last 6 months? No  LIVING ENVIRONMENT: Lives with: lives alone Lives in: House/apartment Stairs: flight inside house, 4STE  Has following equipment at home: Single point cane and Environmental consultant - 4 wheeled  OCCUPATION: retired   PLOF: Independent, Independent with basic ADLs, Independent with gait, and Independent with transfers  PATIENT GOALS: feel better, be able to get my neck right, get my balance right, no falls   NEXT MD VISIT: Dr. Noel Gerold October 30th   OBJECTIVE:  COGNITION: Overall cognitive status: Within functional limits for tasks assessed  SENSATION: Not tested  POSTURE: rounded shoulders, forward  head, decreased lumbar lordosis, increased thoracic kyphosis, and flexed trunk   PALPATION:  Multiple  severe chains of trigger points B upper traps, paraspinals in cervical and thoracic spine very tight and tender   CERVICAL ROM:   Active ROM A/PROM (deg) eval AROM 08/25/23  Flexion  35*  Extension  3*  Right lateral flexion  10*  Left lateral flexion  10*  Right rotation  27*  Left rotation  18*   (Blank rows = not tested)  Deferred ROM check at eval- will clarify post-op precautions with surgeon prior to AROM- per functional observation, extreme limitations all planes of motion C-spine     UPPER EXTREMITY MMT:  MMT Right eval Left eval Right  Left  Right 08/25/23 Left 08/25/23  Shoulder flexion 4 4 4+ 4+ 5 4+  Shoulder extension        Shoulder abduction 4 4 4+ 4+ 5 5  Shoulder adduction        Shoulder extension        Shoulder internal rotation        Shoulder external rotation        Middle trapezius        Lower trapezius        Elbow flexion 4+ 4+   5 5  Elbow extension 4+ 4+   5 5  Wrist flexion        Wrist extension        Wrist ulnar deviation        Wrist radial deviation        Wrist pronation        Wrist supination        Grip strength        Hip flexion  3 3 4 4 3 3   Quads 4 4+ 4+ 4+ 5 5  Hamstrings 4 4 4+ 4 5 5   Ankle dorsiflexion 5 5       (Blank rows = not tested)    FUNCTIONAL TESTS:  Berg Balance Scale: 32; 08/25/23- 41/56   TODAY'S TREATMENT:                                                                                                                              DATE:   08/25/23  Objective measures for recert, education on goals/progress/POC and will continue dry needling in future sessions  UBE backwards only L2-4 (limited by neck pain) x6 minutes Available ROM strengthening: cx flexion red TB x10, lateral flexion x10 B   08/12/23 UBE L1 in stand, x 3 min for and back DN provided by Otelia Santee, PT for BUT and cervical paraspinals. Massage gun to B cerv paraspinals and sub occipital area as well as UT, LS,  Rhomboids  08/05/23 Lead PT assisted in session performing DN to the bilateral upper traps and cervical area NuStep L 5 x 6 min Alt 6in box taps form airex 2x10 Side step in and off airex Leg  press 50lb 2x10  Seated OHP yellow ball   08/03/23 NuStep L 5 x 6 min Checked  Goals  Resisted gait 20lb 4 way x 3 each  07/29/23 NuStep L5 x 6 min Lead PT assisted in session performing DN to the bilateral upper traps and cervical area In parallel bars  Side steps  Tandem walking  Side steps on balance beam   07/22/23 NuStep L5 x 6 min 6in step ups from airex x 10 each some instability  Lead PT assisted in session performing DN to the bilateral upper traps and cervical area Leg Press 20lb 2x10, 40lb x10 HS curls green 2x10  07/20/23 NuStep L5 x 6 min Alt 6in box taps form airex 2x10 LAQ 5lb 3x10 HS curls green 2x10 Lead PT assisted in session performing DN to the bilateral upper traps and cervical area  07/08/23 NuStep L5 x 6 min 20lb resisted gait 4 way x 3 each Lead PT assisted in session performing DN to the bilateral upper traps and cervical area HS curls green 2x15 Dynamic gait changing directions   07/06/23 Lead PT assisted in session performing DN to the bilateral upper traps and cervical area NuStep L5 x 6 min Horiz abd shoulder red 2x10 LAQ 3lb 2x15 HS curls green 2x10 Alt 4 in box taps 2x10  07/01/23 NuStep L4 x 6 min AAROM 3lb WaTE flex, Ext, IR up back x10 Horix abd shoulder red x10, yellow x10 Seated Rows green 2x12 HS curls green 2x12 LAQ 3lb 2x12 Lead PT assisted in session performing DN to the bilateral upper traps and cervical area   06/29/23 NuStep L4 x 6 min Lead PT assisted in session performing DN to the bilateral upper traps and cervical area Rows red 2x10 Horiz abd red 2x10 Shoulder Ext red x10   PATIENT EDUCATION:  Education details: exam findings, POC, HEP, awaiting clarification on precautions prior to focus on cervical spine  Person  educated: Patient Education method: Explanation, Demonstration, and Handouts Education comprehension: verbalized understanding, returned demonstration, and needs further education  HOME EXERCISE PROGRAM: Access Code: [email or web address] URL: [email or web address] Date: 06/24/2023 Prepared by: Debroah Baller  Exercises - Seated Scapular Retraction  - 2 x daily - 7 x weekly - 1 sets - 10 reps - 2 seconds  hold - Seated Backward Shoulder Rolls  - 2 x daily - 7 x weekly - 1 sets - 10 reps - Semi-Tandem Corner Balance With Eyes Open  - 2 x daily - 7 x weekly - 1 sets - 4 reps - 30 seconds hold - Seated Hip Abduction with Resistance  - 2 x daily - 7 x weekly - 1 sets - 10 reps - 1 second  hold - Standing Shoulder Flexion AAROM with Dowel  - 1 x daily - 7 x weekly - 3 sets - 10 reps - Standing Shoulder Extension with Dowel  - 1 x daily - 7 x weekly - 3 sets - 10 reps - Standing Bilateral Shoulder Internal Rotation AAROM with Dowel  - 1 x daily - 7 x weekly - 3 sets - 10 reps  ASSESSMENT:  CLINICAL IMPRESSION:  Session focus on getting objective data for PT recert- still having a hard time with cervical ROM and weakness, balance, and functional activity tolerance. Does show some improvement with skilled PT services but would really benefit from extension of POC to help address remaining deficits. Getting a lot of relief from dry needling, we will continue this in future sessions.    OBJECTIVE  IMPAIRMENTS: Abnormal gait, decreased activity tolerance, decreased balance, decreased mobility, difficulty walking, decreased ROM, decreased strength, hypomobility, increased fascial restrictions, increased muscle spasms, impaired flexibility, and pain.   ACTIVITY LIMITATIONS: carrying, lifting, bending, squatting, stairs, bed mobility, and locomotion level  PARTICIPATION LIMITATIONS: meal prep, cleaning, laundry, shopping, community activity, and yard work  PERSONAL FACTORS: Age, Behavior pattern,  Fitness, Past/current experiences, and Time since onset of injury/illness/exacerbation are also affecting patient's functional outcome.   REHAB POTENTIAL: Good  CLINICAL DECISION MAKING: Stable/uncomplicated  EVALUATION COMPLEXITY: Low   GOALS: Goals reviewed with patient? Yes  SHORT TERM GOALS: Target date: 09/08/2023      Will be compliant with appropriate progressive HEP  Baseline:  Goal status: Met 07/01/23  2.  Cervical ROM to improve by at least 15 degrees all planes of motion (when cleared to work on ROM by Careers adviser) Baseline:  Goal status: ONGOING 08/25/23  3.  Will be able to maintain tandem stance for 15 seconds with no more than MinA  Baseline:  Goal status: ONGOING 08/25/23  4.  Soft tissue restrictions and trigger points to have improved by at least 50% to assist in pain management  Baseline:  Goal status: Met 08/03/23    LONG TERM GOALS: Target date: 09/22/2023      MMT to have improved by 1 grade in all weak groups  Baseline:  Goal status: ONGOING 08/25/23  2.  Will score at least 45 on Berg to show reduced fall risk  Baseline:  Goal status: ONGOING 08/25/23  3.  Pain in cervical spine to be no more than 2/10 at worst  Baseline:  Goal status: ONGOING 08/25/23  4.  Will be compliant with appropriate gym exercise program to maintain functional gains after DC from PT  Baseline:  Goal status: ONGOING 08/25/23     PLAN:  PT FREQUENCY: 1-2x/week  PT DURATION: 4 weeks  PLANNED INTERVENTIONS: Therapeutic exercises, Therapeutic activity, Neuromuscular re-education, Gait training, Self Care, Dry Needling, Cryotherapy, Moist heat, and Manual therapy  PLAN FOR NEXT SESSION: Needs better balance, still working on neck pain and stiffness and the UE weakness. Need to work on specific cervical strengthening. Continue dry needling, really helps pain   Nedra Hai, PT, DPT 08/25/23 4:00 PM

## 2023-08-31 ENCOUNTER — Encounter: Payer: Self-pay | Admitting: Physical Therapy

## 2023-08-31 ENCOUNTER — Other Ambulatory Visit: Payer: Medicare Other

## 2023-08-31 ENCOUNTER — Ambulatory Visit: Payer: Medicare Other | Admitting: Physical Therapy

## 2023-08-31 DIAGNOSIS — M6281 Muscle weakness (generalized): Secondary | ICD-10-CM | POA: Diagnosis not present

## 2023-08-31 DIAGNOSIS — M542 Cervicalgia: Secondary | ICD-10-CM

## 2023-08-31 DIAGNOSIS — M5412 Radiculopathy, cervical region: Secondary | ICD-10-CM

## 2023-08-31 DIAGNOSIS — R2681 Unsteadiness on feet: Secondary | ICD-10-CM

## 2023-08-31 DIAGNOSIS — R262 Difficulty in walking, not elsewhere classified: Secondary | ICD-10-CM

## 2023-08-31 NOTE — Therapy (Signed)
OUTPATIENT PHYSICAL THERAPY CERVICAL TREATMENT/RECERT         Patient Name: Becky Hobbs MRN: [id number] DOB:January 07, 1946, 77 y.o., female Today's Date: 08/31/2023  END OF SESSION:  PT End of Session - 08/31/23 1523     Visit Number 14    Date for PT Re-Evaluation 09/22/23    PT Start Time 1523    PT Stop Time 1600    PT Time Calculation (min) 37 min    Activity Tolerance Patient tolerated treatment well    Behavior During Therapy WFL for tasks assessed/performed             Past Medical History:  Diagnosis Date   Anemia    Anxiety    panic attacks   Arthritis    Asthma    Blood transfusion    1973--with twins birth   Bronchitis    Chronic kidney disease    Diabetes mellitus    borderline   GERD (gastroesophageal reflux disease)    H/O hiatal hernia    surgery fixed   Headache    migraines   Heart murmur    MVP   Hepatitis    Hep C   Hypertension    Hypothyroidism    Pneumonia    Past Surgical History:  Procedure Laterality Date   ABDOMINAL HYSTERECTOMY     APPENDECTOMY     APPLICATION OF INTRAOPERATIVE CT SCAN N/A 06/05/2021   Procedure: APPLICATION OF INTRAOPERATIVE CT SCAN;  Surgeon: Bethann Goo, DO;  Location: [facility] OR;  Service: Neurosurgery;  Laterality: N/A;   BACK SURGERY     bladder tack     BREAST CYST EXCISION Right    BREAST CYST INCISION AND DRAINAGE     BREAST SURGERY     BUNIONECTOMY     CHOLECYSTECTOMY     DILATION AND CURETTAGE OF UTERUS     ESOPHAGOGASTRODUODENOSCOPY (EGD) WITH PROPOFOL N/A 01/03/2019   Procedure: ESOPHAGOGASTRODUODENOSCOPY (EGD) WITH PROPOFOL;  Surgeon: Kathi Der, MD;  Location: [facility];  Service: Gastroenterology;  Laterality: N/A;   FOREIGN BODY REMOVAL  01/03/2019   Procedure: FOREIGN BODY REMOVAL;  Surgeon: Kathi Der, MD;  Location: [facility];  Service: Gastroenterology;;   FRACTURE SURGERY     left wrist   HERNIA REPAIR     hiatal hernia   LUMBAR FUSION  07/26/2017   LUMBAR  LAMINECTOMY  03/04/2012   Procedure: MICRODISCECTOMY LUMBAR LAMINECTOMY;  Surgeon: Kerrin Champagne, MD;  Location: [facility];  Service: Orthopedics;  Laterality: N/A;  L3-4 central laminectomy   NASAL SEPTUM SURGERY     nissan fundoplication     OVARIAN CYST SURGERY     POSTERIOR CERVICAL FUSION/FORAMINOTOMY N/A 06/05/2021   Procedure: OCCIPITAL- CERVICAL TWO INSTRUMENTATION AND FUSION; EXTENSION TO CERVICAL FIVE;  Surgeon: Bethann Goo, DO;  Location: [facility];  Service: Neurosurgery;  Laterality: N/A;   THORACIC FUSION  07/26/2017   TONSILLECTOMY     Patient Active Problem List   Diagnosis Date Noted   Chronic incomplete quadriplegia (HCC) 08/12/2022   Spasticity 08/12/2022   Chronic kidney disease (CKD), stage IV (severe) (HCC) 08/12/2022   Neurogenic bladder 04/03/2022   Spondylosis, cervical, with myelopathy 04/03/2022   Myofascial pain dysfunction syndrome 12/17/2021   Hepatic cirrhosis (HCC) 06/26/2021   Fracture of C1 vertebra, closed (HCC) 06/09/2021   Closed C1 fracture (HCC) 06/05/2021   Plantar fasciitis of left foot 08/21/2018   Acute renal failure (ARF) (HCC) 02/09/2018   Chronic migraine without aura without status migrainosus,  not intractable 06/16/2017   History of lumbar fusion 08/03/2016    Class: Chronic   Migraine 04/09/2016   Acute-on-chronic kidney injury (HCC) 09/07/2015   Hypertension 09/07/2015   Hypothyroidism 09/07/2015   Gout 09/07/2015   Low back pain 09/07/2015   Obesity (BMI 30-39.9) 07/06/2013   Headache 04/12/2013   Concussion 04/12/2013   Lumbar spinal stenosis 03/04/2012    Class: Diagnosis of    PCP: Genice Rouge MD   REFERRING PROVIDER: Patricia Nettle, MD  REFERRING DIAG: Cervical Fusion  THERAPY DIAG:  Muscle weakness (generalized)  Radiculopathy, cervical region  Cervicalgia  Unsteadiness on feet  Difficulty in walking, not elsewhere classified  Rationale for Evaluation and Treatment: Rehabilitation  ONSET DATE: cervical fusion  02/01/23  SUBJECTIVE:                                                                                                                                                                                                         SUBJECTIVE STATEMENT:  Doing OK, my good girlfriend died and I was able to watch the service via streaming. Neck is still feeling weak, I feel like I have to hold my head up. Balance is still off. Feeling fatigued a lot. Can't do but so much, have to take a lot of breaks.   Hand dominance: Right  PERTINENT HISTORY:  Anxiety/panic attacks, OA, CKD, DM, hernia s/p surgical repair, hepatitis, HTN, hypothyroidism, back surgery, bunionectomy, cholecystectomy, L wrist fracture surgery, lumbar fusion, lumbar laminectomy, posterior cervical fusion/foraminotomy, ACDF 4/24  PAIN:  Are you having pain? No 0/10 now   PRECAUTIONS: activity as tolerated per evaluating PT discussion with surgeon   RED FLAGS: None     WEIGHT BEARING RESTRICTIONS: No  FALLS:  Has patient fallen in last 6 months? No  LIVING ENVIRONMENT: Lives with: lives alone Lives in: House/apartment Stairs: flight inside house, 4STE  Has following equipment at home: Single point cane and Environmental consultant - 4 wheeled  OCCUPATION: retired   PLOF: Independent, Independent with basic ADLs, Independent with gait, and Independent with transfers  PATIENT GOALS: feel better, be able to get my neck right, get my balance right, no falls   NEXT MD VISIT: Dr. Noel Gerold October 30th   OBJECTIVE:  COGNITION: Overall cognitive status: Within functional limits for tasks assessed  SENSATION: Not tested  POSTURE: rounded shoulders, forward head, decreased lumbar lordosis, increased thoracic kyphosis, and flexed trunk   PALPATION:  Multiple severe chains of trigger points B upper traps, paraspinals in cervical and thoracic spine very tight and tender  CERVICAL ROM:   Active ROM A/PROM (deg) eval AROM 08/25/23  Flexion   35*  Extension  3*  Right lateral flexion  10*  Left lateral flexion  10*  Right rotation  27*  Left rotation  18*   (Blank rows = not tested)  Deferred ROM check at eval- will clarify post-op precautions with surgeon prior to AROM- per functional observation, extreme limitations all planes of motion C-spine     UPPER EXTREMITY MMT:  MMT Right eval Left eval Right  Left  Right 08/25/23 Left 08/25/23  Shoulder flexion 4 4 4+ 4+ 5 4+  Shoulder extension        Shoulder abduction 4 4 4+ 4+ 5 5  Shoulder adduction        Shoulder extension        Shoulder internal rotation        Shoulder external rotation        Middle trapezius        Lower trapezius        Elbow flexion 4+ 4+   5 5  Elbow extension 4+ 4+   5 5  Wrist flexion        Wrist extension        Wrist ulnar deviation        Wrist radial deviation        Wrist pronation        Wrist supination        Grip strength        Hip flexion  3 3 4 4 3 3   Quads 4 4+ 4+ 4+ 5 5  Hamstrings 4 4 4+ 4 5 5   Ankle dorsiflexion 5 5       (Blank rows = not tested)    FUNCTIONAL TESTS:  Berg Balance Scale: 32; 08/25/23- 41/56   TODAY'S TREATMENT:                                                                                                                              DATE:  08/31/23 NuStep L5 x 6 min Lead PT assisted in session performing DN to the bilateral upper traps and cervical area Side step on and off airex  Alt 8in box taps from airex 2x10 Side steps on balance bean in // bars  08/25/23 Objective measures for recert, education on goals/progress/POC and will continue dry needling in future sessions  UBE backwards only L2-4 (limited by neck pain) x6 minutes Available ROM strengthening: cx flexion red TB x10, lateral flexion x10 B   08/12/23 UBE L1 in stand, x 3 min for and back DN provided by Otelia Santee, PT for BUT and cervical paraspinals. Massage gun to B cerv paraspinals and sub occipital area as  well as UT, LS, Rhomboids  08/05/23 Lead PT assisted in session performing DN to the bilateral upper traps and cervical area NuStep L 5 x 6 min Alt 6in box taps form airex 2x10  Side step in and off airex Leg press 50lb 2x10  Seated OHP yellow ball   08/03/23 NuStep L 5 x 6 min Checked  Goals  Resisted gait 20lb 4 way x 3 each  07/29/23 NuStep L5 x 6 min Lead PT assisted in session performing DN to the bilateral upper traps and cervical area In parallel bars  Side steps  Tandem walking  Side steps on balance beam    PATIENT EDUCATION:  Education details: exam findings, POC, HEP, awaiting clarification on precautions prior to focus on cervical spine  Person educated: Patient Education method: Explanation, Demonstration, and Handouts Education comprehension: verbalized understanding, returned demonstration, and needs further education  HOME EXERCISE PROGRAM: Access Code: [email or web address] URL: [email or web address] Date: 06/24/2023 Prepared by: Debroah Baller  Exercises - Seated Scapular Retraction  - 2 x daily - 7 x weekly - 1 sets - 10 reps - 2 seconds  hold - Seated Backward Shoulder Rolls  - 2 x daily - 7 x weekly - 1 sets - 10 reps - Semi-Tandem Corner Balance With Eyes Open  - 2 x daily - 7 x weekly - 1 sets - 4 reps - 30 seconds hold - Seated Hip Abduction with Resistance  - 2 x daily - 7 x weekly - 1 sets - 10 reps - 1 second  hold - Standing Shoulder Flexion AAROM with Dowel  - 1 x daily - 7 x weekly - 3 sets - 10 reps - Standing Shoulder Extension with Dowel  - 1 x daily - 7 x weekly - 3 sets - 10 reps - Standing Bilateral Shoulder Internal Rotation AAROM with Dowel  - 1 x daily - 7 x weekly - 3 sets - 10 reps  ASSESSMENT:  CLINICAL IMPRESSION:  Pt enters doing well overall. Some instability with alt box taps and side steps both with the use of airex pad.  Pt still having a hard time with cervical ROM and weakness, balance, and functional activity  tolerance. Does show some improvement with skilled PT services but would really benefit from extension of POC to help address remaining deficits. Getting a lot of relief from dry needling, we will continue this in future sessions.    OBJECTIVE IMPAIRMENTS: Abnormal gait, decreased activity tolerance, decreased balance, decreased mobility, difficulty walking, decreased ROM, decreased strength, hypomobility, increased fascial restrictions, increased muscle spasms, impaired flexibility, and pain.   ACTIVITY LIMITATIONS: carrying, lifting, bending, squatting, stairs, bed mobility, and locomotion level  PARTICIPATION LIMITATIONS: meal prep, cleaning, laundry, shopping, community activity, and yard work  PERSONAL FACTORS: Age, Behavior pattern, Fitness, Past/current experiences, and Time since onset of injury/illness/exacerbation are also affecting patient's functional outcome.   REHAB POTENTIAL: Good  CLINICAL DECISION MAKING: Stable/uncomplicated  EVALUATION COMPLEXITY: Low   GOALS: Goals reviewed with patient? Yes  SHORT TERM GOALS: Target date: 09/08/2023      Will be compliant with appropriate progressive HEP  Baseline:  Goal status: Met 07/01/23  2.  Cervical ROM to improve by at least 15 degrees all planes of motion (when cleared to work on ROM by Careers adviser) Baseline:  Goal status: ONGOING 08/25/23  3.  Will be able to maintain tandem stance for 15 seconds with no more than MinA  Baseline:  Goal status: ONGOING 08/25/23  4.  Soft tissue restrictions and trigger points to have improved by at least 50% to assist in pain management  Baseline:  Goal status: Met 08/03/23    LONG TERM GOALS: Target date: 09/22/2023  MMT to have improved by 1 grade in all weak groups  Baseline:  Goal status: ONGOING 08/25/23  2.  Will score at least 45 on Berg to show reduced fall risk  Baseline:  Goal status: ONGOING 08/25/23  3.  Pain in cervical spine to be no more than 2/10 at  worst  Baseline:  Goal status: ONGOING 08/25/23  4.  Will be compliant with appropriate gym exercise program to maintain functional gains after DC from PT  Baseline:  Goal status: ONGOING 08/25/23     PLAN:  PT FREQUENCY: 1-2x/week  PT DURATION: 4 weeks  PLANNED INTERVENTIONS: Therapeutic exercises, Therapeutic activity, Neuromuscular re-education, Gait training, Self Care, Dry Needling, Cryotherapy, Moist heat, and Manual therapy  PLAN FOR NEXT SESSION: Needs better balance, still working on neck pain and stiffness and the UE weakness. Need to work on specific cervical strengthening. Continue dry needling, really helps pain   Nedra Hai, PT, DPT 08/31/23 3:26 PM

## 2023-09-02 ENCOUNTER — Ambulatory Visit: Payer: Medicare Other | Admitting: Physical Therapy

## 2023-09-06 ENCOUNTER — Ambulatory Visit: Payer: Medicare Other | Admitting: Physical Therapy

## 2023-09-07 ENCOUNTER — Ambulatory Visit
Admission: RE | Admit: 2023-09-07 | Discharge: 2023-09-07 | Disposition: A | Discharge status: Home / Self Care | Payer: Medicare Other | Source: Ambulatory Visit | Attending: Internal Medicine | Admitting: Internal Medicine

## 2023-09-07 DIAGNOSIS — N63 Unspecified lump in unspecified breast: Secondary | ICD-10-CM

## 2023-09-08 ENCOUNTER — Ambulatory Visit: Payer: Medicare Other | Admitting: Physical Therapy

## 2023-09-15 ENCOUNTER — Ambulatory Visit: Payer: Medicare Other | Admitting: Physical Therapy

## 2023-09-21 ENCOUNTER — Ambulatory Visit: Payer: Medicare Other | Attending: Orthopaedic Surgery | Admitting: Physical Therapy

## 2023-09-21 DIAGNOSIS — M542 Cervicalgia: Secondary | ICD-10-CM | POA: Diagnosis present

## 2023-09-21 DIAGNOSIS — M5412 Radiculopathy, cervical region: Secondary | ICD-10-CM | POA: Insufficient documentation

## 2023-09-21 DIAGNOSIS — R262 Difficulty in walking, not elsewhere classified: Secondary | ICD-10-CM | POA: Diagnosis present

## 2023-09-21 DIAGNOSIS — R2681 Unsteadiness on feet: Secondary | ICD-10-CM | POA: Diagnosis present

## 2023-09-21 DIAGNOSIS — M6281 Muscle weakness (generalized): Secondary | ICD-10-CM | POA: Diagnosis present

## 2023-09-21 NOTE — Therapy (Signed)
OUTPATIENT PHYSICAL THERAPY CERVICAL TREATMENT   Patient Name: Becky Hobbs MRN: [id number] DOB:August 12, 1946, 77 y.o., female Today's Date: 09/21/2023  END OF SESSION:  PT End of Session - 09/21/23 1608     Visit Number 15    Number of Visits 19    Date for PT Re-Evaluation 10/17/22    Authorization Type [employer] MCR    Authorization Time Period 4 visits until 10/18/23    Authorization - Visit Number 1    Authorization - Number of Visits 4    PT Start Time 1608   late sign in   PT Stop Time 1646    PT Time Calculation (min) 38 min    Activity Tolerance Patient tolerated treatment well    Behavior During Therapy WFL for tasks assessed/performed              Past Medical History:  Diagnosis Date   Anemia    Anxiety    panic attacks   Arthritis    Asthma    Blood transfusion    1973--with twins birth   Bronchitis    Chronic kidney disease    Diabetes mellitus    borderline   GERD (gastroesophageal reflux disease)    H/O hiatal hernia    surgery fixed   Headache    migraines   Heart murmur    MVP   Hepatitis    Hep C   Hypertension    Hypothyroidism    Pneumonia    Past Surgical History:  Procedure Laterality Date   ABDOMINAL HYSTERECTOMY     APPENDECTOMY     APPLICATION OF INTRAOPERATIVE CT SCAN N/A 06/05/2021   Procedure: APPLICATION OF INTRAOPERATIVE CT SCAN;  Surgeon: Dawley, Alan Mulder, DO;  Location: [facility] OR;  Service: Neurosurgery;  Laterality: N/A;   BACK SURGERY     bladder tack     BREAST CYST EXCISION Right    BREAST CYST INCISION AND DRAINAGE     BREAST SURGERY     BUNIONECTOMY     CHOLECYSTECTOMY     DILATION AND CURETTAGE OF UTERUS     ESOPHAGOGASTRODUODENOSCOPY (EGD) WITH PROPOFOL N/A 01/03/2019   Procedure: ESOPHAGOGASTRODUODENOSCOPY (EGD) WITH PROPOFOL;  Surgeon: Kathi Der, MD;  Location: [facility];  Service: Gastroenterology;  Laterality: N/A;   FOREIGN BODY REMOVAL  01/03/2019   Procedure: FOREIGN BODY REMOVAL;  Surgeon:  Kathi Der, MD;  Location: [facility];  Service: Gastroenterology;;   FRACTURE SURGERY     left wrist   HERNIA REPAIR     hiatal hernia   LUMBAR FUSION  07/26/2017   LUMBAR LAMINECTOMY  03/04/2012   Procedure: MICRODISCECTOMY LUMBAR LAMINECTOMY;  Surgeon: Kerrin Champagne, MD;  Location: [facility] OR;  Service: Orthopedics;  Laterality: N/A;  L3-4 central laminectomy   NASAL SEPTUM SURGERY     nissan fundoplication     OVARIAN CYST SURGERY     POSTERIOR CERVICAL FUSION/FORAMINOTOMY N/A 06/05/2021   Procedure: OCCIPITAL- CERVICAL TWO INSTRUMENTATION AND FUSION; EXTENSION TO CERVICAL FIVE;  Surgeon: Bethann Goo, DO;  Location: [facility];  Service: Neurosurgery;  Laterality: N/A;   THORACIC FUSION  07/26/2017   TONSILLECTOMY     Patient Active Problem List   Diagnosis Date Noted   Chronic incomplete quadriplegia (HCC) 08/12/2022   Spasticity 08/12/2022   Chronic kidney disease (CKD), stage IV (severe) (HCC) 08/12/2022   Neurogenic bladder 04/03/2022   Spondylosis, cervical, with myelopathy 04/03/2022   Myofascial pain dysfunction syndrome 12/17/2021   Hepatic cirrhosis (HCC) 06/26/2021  Fracture of C1 vertebra, closed (HCC) 06/09/2021   Closed C1 fracture (HCC) 06/05/2021   Plantar fasciitis of left foot 08/21/2018   Acute renal failure (ARF) (HCC) 02/09/2018   Chronic migraine without aura without status migrainosus, not intractable 06/16/2017   History of lumbar fusion 08/03/2016    Class: Chronic   Migraine 04/09/2016   Acute-on-chronic kidney injury (HCC) 09/07/2015   Hypertension 09/07/2015   Hypothyroidism 09/07/2015   Gout 09/07/2015   Low back pain 09/07/2015   Obesity (BMI 30-39.9) 07/06/2013   Headache 04/12/2013   Concussion 04/12/2013   Lumbar spinal stenosis 03/04/2012    Class: Diagnosis of    PCP: Genice Rouge MD   REFERRING PROVIDER: Patricia Nettle, MD  REFERRING DIAG: Cervical Fusion  THERAPY DIAG:  Muscle weakness  (generalized)  Cervicalgia  Unsteadiness on feet  Difficulty in walking, not elsewhere classified  Radiculopathy, cervical region  Rationale for Evaluation and Treatment: Rehabilitation  ONSET DATE: cervical fusion 02/01/23  SUBJECTIVE:                                                                                                                                                                                                         SUBJECTIVE STATEMENT: Pt states her friend died and had to cancel appointments. Pt states she is getting better at holding her head up.   Hand dominance: Right  PERTINENT HISTORY:  Anxiety/panic attacks, OA, CKD, DM, hernia s/p surgical repair, hepatitis, HTN, hypothyroidism, back surgery, bunionectomy, cholecystectomy, L wrist fracture surgery, lumbar fusion, lumbar laminectomy, posterior cervical fusion/foraminotomy, ACDF 4/24  PAIN:  Are you having pain? No 0/10 now   PRECAUTIONS: activity as tolerated per evaluating PT discussion with surgeon   RED FLAGS: None     WEIGHT BEARING RESTRICTIONS: No  FALLS:  Has patient fallen in last 6 months? No  LIVING ENVIRONMENT: Lives with: lives alone Lives in: House/apartment Stairs: flight inside house, 4STE  Has following equipment at home: Single point cane and Environmental consultant - 4 wheeled  OCCUPATION: retired   PLOF: Independent, Independent with basic ADLs, Independent with gait, and Independent with transfers  PATIENT GOALS: feel better, be able to get my neck right, get my balance right, no falls   NEXT MD VISIT: Dr. Noel Gerold October 30th   OBJECTIVE:  COGNITION: Overall cognitive status: Within functional limits for tasks assessed  SENSATION: Not tested  POSTURE: rounded shoulders, forward head, decreased lumbar lordosis, increased thoracic kyphosis, and flexed trunk   PALPATION:  Multiple severe chains of trigger points B upper traps, paraspinals in cervical and  thoracic spine very tight  and tender   CERVICAL ROM:   Active ROM A/PROM (deg) eval AROM 08/25/23  Flexion  35*  Extension  3*  Right lateral flexion  10*  Left lateral flexion  10*  Right rotation  27*  Left rotation  18*   (Blank rows = not tested)  Deferred ROM check at eval- will clarify post-op precautions with surgeon prior to AROM- per functional observation, extreme limitations all planes of motion C-spine     UPPER EXTREMITY MMT:  MMT Right eval Left eval Right  Left  Right 08/25/23 Left 08/25/23  Shoulder flexion 4 4 4+ 4+ 5 4+  Shoulder extension        Shoulder abduction 4 4 4+ 4+ 5 5  Shoulder adduction        Shoulder extension        Shoulder internal rotation        Shoulder external rotation        Middle trapezius        Lower trapezius        Elbow flexion 4+ 4+   5 5  Elbow extension 4+ 4+   5 5  Wrist flexion        Wrist extension        Wrist ulnar deviation        Wrist radial deviation        Wrist pronation        Wrist supination        Grip strength        Hip flexion  3 3 4 4 3 3   Quads 4 4+ 4+ 4+ 5 5  Hamstrings 4 4 4+ 4 5 5   Ankle dorsiflexion 5 5       (Blank rows = not tested)    FUNCTIONAL TESTS:  Berg Balance Scale: 32; 08/25/23- 41/56   TODAY'S TREATMENT:                                                                                                                              DATE:  09/21/23 Nustep L6 x 5 min UEs/LEs Modified quadruped thoracic rotation x10 Sitting thoracic rotation 2x5 Sitting thoracic extension with arm support x10 Sitting cervical isometrics rotation 5x5", retraction 5x5", lateral flexion 5x5" STM & TPR bilat UTs, cervical paraspinals Skilled assessment and palpation for TPDN Trigger Point Dry-Needling  Treatment instructions: Expect mild to moderate muscle soreness. S/S of pneumothorax if dry needled over a lung field, and to seek immediate medical attention should they occur. Patient verbalized understanding of these  instructions and education.  Patient Consent Given: Yes Education handout provided: Previously provided Muscles treated: Bilat UTs, cervical paraspinals Electrical stimulation performed: No Parameters: N/A Treatment response/outcome: Decreased muscle tension, twitch response   08/31/23 NuStep L5 x 6 min Lead PT assisted in session performing DN to the bilateral upper traps and cervical area Side step on and off airex  Alt  8in box taps from airex 2x10 Side steps on balance bean in // bars  08/25/23 Objective measures for recert, education on goals/progress/POC and will continue dry needling in future sessions UBE backwards only L2-4 (limited by neck pain) x6 minutes Available ROM strengthening: cx flexion red TB x10, lateral flexion x10 B   08/12/23 UBE L1 in stand, x 3 min for and back DN provided by Otelia Santee, PT for BUT and cervical paraspinals. Massage gun to B cerv paraspinals and sub occipital area as well as UT, LS, Rhomboids  08/05/23 Lead PT assisted in session performing DN to the bilateral upper traps and cervical area NuStep L 5 x 6 min Alt 6in box taps form airex 2x10 Side step in and off airex Leg press 50lb 2x10  Seated OHP yellow ball   08/03/23 NuStep L 5 x 6 min Checked  Goals  Resisted gait 20lb 4 way x 3 each  07/29/23 NuStep L5 x 6 min Lead PT assisted in session performing DN to the bilateral upper traps and cervical area In parallel bars  Side steps  Tandem walking  Side steps on balance beam    PATIENT EDUCATION:  Education details: exam findings, POC, HEP, awaiting clarification on precautions prior to focus on cervical spine  Person educated: Patient Education method: Explanation, Demonstration, and Handouts Education comprehension: verbalized understanding, returned demonstration, and needs further education  HOME EXERCISE PROGRAM: Access Code: [email or web address] URL: [email or web address] Date: 06/24/2023 Prepared by:  Debroah Baller  Exercises - Seated Scapular Retraction  - 2 x daily - 7 x weekly - 1 sets - 10 reps - 2 seconds  hold - Seated Backward Shoulder Rolls  - 2 x daily - 7 x weekly - 1 sets - 10 reps - Semi-Tandem Corner Balance With Eyes Open  - 2 x daily - 7 x weekly - 1 sets - 4 reps - 30 seconds hold - Seated Hip Abduction with Resistance  - 2 x daily - 7 x weekly - 1 sets - 10 reps - 1 second  hold - Standing Shoulder Flexion AAROM with Dowel  - 1 x daily - 7 x weekly - 3 sets - 10 reps - Standing Shoulder Extension with Dowel  - 1 x daily - 7 x weekly - 3 sets - 10 reps - Standing Bilateral Shoulder Internal Rotation AAROM with Dowel  - 1 x daily - 7 x weekly - 3 sets - 10 reps  ASSESSMENT:  CLINICAL IMPRESSION: Continued cervical strengthening with isometrics. Manual work and TPDN to decrease muscle tension and improve cervical ROM. Working on thoracic and scapular mobility for improved rotation and stability. Has not met any of her LTGs at this time due to lapse in her PT sessions due to a friend's death. Pt will benefit from continued PT to improve pt's overall postural strength to reach her goals.   OBJECTIVE IMPAIRMENTS: Abnormal gait, decreased activity tolerance, decreased balance, decreased mobility, difficulty walking, decreased ROM, decreased strength, hypomobility, increased fascial restrictions, increased muscle spasms, impaired flexibility, and pain.   ACTIVITY LIMITATIONS: carrying, lifting, bending, squatting, stairs, bed mobility, and locomotion level  PARTICIPATION LIMITATIONS: meal prep, cleaning, laundry, shopping, community activity, and yard work  PERSONAL FACTORS: Age, Behavior pattern, Fitness, Past/current experiences, and Time since onset of injury/illness/exacerbation are also affecting patient's functional outcome.   REHAB POTENTIAL: Good  CLINICAL DECISION MAKING: Stable/uncomplicated  EVALUATION COMPLEXITY: Low   GOALS: Goals reviewed with patient?  Yes  SHORT TERM GOALS: Target date: 09/08/2023  Will be compliant with appropriate progressive HEP  Baseline:  Goal status: Met 07/01/23  2.  Cervical ROM to improve by at least 15 degrees all planes of motion (when cleared to work on ROM by Careers adviser) Baseline:  Goal status: ONGOING 08/25/23  3.  Will be able to maintain tandem stance for 15 seconds with no more than MinA  Baseline:  Goal status: ONGOING 08/25/23  4.  Soft tissue restrictions and trigger points to have improved by at least 50% to assist in pain management  Baseline:  Goal status: Met 08/03/23    LONG TERM GOALS: Target date: 10/19/2023    MMT to have improved by 1 grade in all weak groups  Baseline:  Goal status: ONGOING 08/25/23  2.  Will score at least 45 on Berg to show reduced fall risk  Baseline:  Goal status: ONGOING 08/25/23  3.  Pain in cervical spine to be no more than 2/10 at worst  Baseline:  Goal status: ONGOING 08/25/23  4.  Will be compliant with appropriate gym exercise program to maintain functional gains after DC from PT  Baseline:  Goal status: ONGOING 08/25/23     PLAN:  PT FREQUENCY: 1-2x/week  PT DURATION: 4 weeks  PLANNED INTERVENTIONS: Therapeutic exercises, Therapeutic activity, Neuromuscular re-education, Gait training, Self Care, Dry Needling, Cryotherapy, Moist heat, and Manual therapy  PLAN FOR NEXT SESSION: Needs better balance, still working on neck pain and stiffness and the UE weakness. Need to work on specific cervical strengthening. Continue dry needling, really helps pain   Brendan Gruwell April Dell Ponto, PT 09/21/23 4:50 PM

## 2023-09-22 ENCOUNTER — Other Ambulatory Visit: Payer: Self-pay | Admitting: Nurse Practitioner

## 2023-09-22 DIAGNOSIS — K7469 Other cirrhosis of liver: Secondary | ICD-10-CM

## 2023-09-27 ENCOUNTER — Ambulatory Visit
Admission: RE | Admit: 2023-09-27 | Discharge: 2023-09-27 | Disposition: A | Discharge status: Home / Self Care | Payer: Medicare Other | Source: Ambulatory Visit | Attending: Nurse Practitioner

## 2023-09-27 DIAGNOSIS — K7469 Other cirrhosis of liver: Secondary | ICD-10-CM

## 2023-09-29 ENCOUNTER — Ambulatory Visit: Payer: Medicare Other | Admitting: Physical Therapy

## 2023-09-29 ENCOUNTER — Encounter: Payer: Self-pay | Admitting: Physical Therapy

## 2023-09-29 DIAGNOSIS — R262 Difficulty in walking, not elsewhere classified: Secondary | ICD-10-CM

## 2023-09-29 DIAGNOSIS — M6281 Muscle weakness (generalized): Secondary | ICD-10-CM | POA: Diagnosis not present

## 2023-09-29 DIAGNOSIS — M542 Cervicalgia: Secondary | ICD-10-CM

## 2023-09-29 DIAGNOSIS — M5412 Radiculopathy, cervical region: Secondary | ICD-10-CM

## 2023-09-29 DIAGNOSIS — R2681 Unsteadiness on feet: Secondary | ICD-10-CM

## 2023-09-29 NOTE — Therapy (Signed)
OUTPATIENT PHYSICAL THERAPY CERVICAL TREATMENT   Patient Name: Becky Hobbs MRN: [id number] DOB:[birth year], 77 y.o., female Today's Date: 09/29/2023  END OF SESSION:  PT End of Session - 09/29/23 1702     Visit Number 16    Number of Visits 19    Date for PT Re-Evaluation 10/17/22    Authorization Type [employer] MCR    Authorization Time Period 2 of 4 visits 10/18/23    PT Start Time 1700    PT Stop Time 1745    PT Time Calculation (min) 45 min    Activity Tolerance Patient tolerated treatment well    Behavior During Therapy WFL for tasks assessed/performed              Past Medical History:  Diagnosis Date   Anemia    Anxiety    panic attacks   Arthritis    Asthma    Blood transfusion    1973--with twins birth   Bronchitis    Chronic kidney disease    Diabetes mellitus    borderline   GERD (gastroesophageal reflux disease)    H/O hiatal hernia    surgery fixed   Headache    migraines   Heart murmur    MVP   Hepatitis    Hep C   Hypertension    Hypothyroidism    Pneumonia    Past Surgical History:  Procedure Laterality Date   ABDOMINAL HYSTERECTOMY     APPENDECTOMY     APPLICATION OF INTRAOPERATIVE CT SCAN N/A 06/05/2021   Procedure: APPLICATION OF INTRAOPERATIVE CT SCAN;  Surgeon: Bethann Goo, DO;  Location: [facility];  Service: Neurosurgery;  Laterality: N/A;   BACK SURGERY     bladder tack     BREAST CYST EXCISION Right    BREAST CYST INCISION AND DRAINAGE     BREAST SURGERY     BUNIONECTOMY     CHOLECYSTECTOMY     DILATION AND CURETTAGE OF UTERUS     ESOPHAGOGASTRODUODENOSCOPY (EGD) WITH PROPOFOL N/A 01/03/2019   Procedure: ESOPHAGOGASTRODUODENOSCOPY (EGD) WITH PROPOFOL;  Surgeon: Kathi Der, MD;  Location: [facility];  Service: Gastroenterology;  Laterality: N/A;   FOREIGN BODY REMOVAL  01/03/2019   Procedure: FOREIGN BODY REMOVAL;  Surgeon: Kathi Der, MD;  Location: [facility];  Service: Gastroenterology;;   FRACTURE SURGERY      left wrist   HERNIA REPAIR     hiatal hernia   LUMBAR FUSION  07/26/2017   LUMBAR LAMINECTOMY  03/04/2012   Procedure: MICRODISCECTOMY LUMBAR LAMINECTOMY;  Surgeon: Kerrin Champagne, MD;  Location: [facility];  Service: Orthopedics;  Laterality: N/A;  L3-4 central laminectomy   NASAL SEPTUM SURGERY     nissan fundoplication     OVARIAN CYST SURGERY     POSTERIOR CERVICAL FUSION/FORAMINOTOMY N/A 06/05/2021   Procedure: OCCIPITAL- CERVICAL TWO INSTRUMENTATION AND FUSION; EXTENSION TO CERVICAL FIVE;  Surgeon: Bethann Goo, DO;  Location: [facility];  Service: Neurosurgery;  Laterality: N/A;   THORACIC FUSION  07/26/2017   TONSILLECTOMY     Patient Active Problem List   Diagnosis Date Noted   Chronic incomplete quadriplegia (HCC) 08/12/2022   Spasticity 08/12/2022   Chronic kidney disease (CKD), stage IV (severe) (HCC) 08/12/2022   Neurogenic bladder 04/03/2022   Spondylosis, cervical, with myelopathy 04/03/2022   Myofascial pain dysfunction syndrome 12/17/2021   Hepatic cirrhosis (HCC) 06/26/2021   Fracture of C1 vertebra, closed (HCC) 06/09/2021   Closed C1 fracture (HCC) 06/05/2021   Plantar fasciitis of  left foot 08/21/2018   Acute renal failure (ARF) (HCC) 02/09/2018   Chronic migraine without aura without status migrainosus, not intractable 06/16/2017   History of lumbar fusion 08/03/2016    Class: Chronic   Migraine 04/09/2016   Acute-on-chronic kidney injury (HCC) 09/07/2015   Hypertension 09/07/2015   Hypothyroidism 09/07/2015   Gout 09/07/2015   Low back pain 09/07/2015   Obesity (BMI 30-39.9) 07/06/2013   Headache 04/12/2013   Concussion 04/12/2013   Lumbar spinal stenosis 03/04/2012    Class: Diagnosis of    PCP: Genice Rouge MD   REFERRING PROVIDER: Patricia Nettle, MD  REFERRING DIAG: Cervical Fusion  THERAPY DIAG:  Muscle weakness (generalized)  Cervicalgia  Unsteadiness on feet  Difficulty in walking, not elsewhere classified  Radiculopathy, cervical  region  Rationale for Evaluation and Treatment: Rehabilitation  ONSET DATE: cervical fusion 02/01/23  SUBJECTIVE:                                                                                                                                                                                                         SUBJECTIVE STATEMENT: Pt reports that when she went home after the last treatment she was in her kitchen and turned around and fell, reports increased neck pain, she is wearing her collar  Hand dominance: Right  PERTINENT HISTORY:  Anxiety/panic attacks, OA, CKD, DM, hernia s/p surgical repair, hepatitis, HTN, hypothyroidism, back surgery, bunionectomy, cholecystectomy, L wrist fracture surgery, lumbar fusion, lumbar laminectomy, posterior cervical fusion/foraminotomy, ACDF 4/24  PAIN:  Are you having pain? No 0/10 now   PRECAUTIONS: activity as tolerated per evaluating PT discussion with surgeon   RED FLAGS: None     WEIGHT BEARING RESTRICTIONS: No  FALLS:  Has patient fallen in last 6 months? No  LIVING ENVIRONMENT: Lives with: lives alone Lives in: House/apartment Stairs: flight inside house, 4STE  Has following equipment at home: Single point cane and Environmental consultant - 4 wheeled  OCCUPATION: retired   PLOF: Independent, Independent with basic ADLs, Independent with gait, and Independent with transfers  PATIENT GOALS: feel better, be able to get my neck right, get my balance right, no falls   NEXT MD VISIT: Dr. Noel Gerold October 30th   OBJECTIVE:  COGNITION: Overall cognitive status: Within functional limits for tasks assessed  SENSATION: Not tested  POSTURE: rounded shoulders, forward head, decreased lumbar lordosis, increased thoracic kyphosis, and flexed trunk   PALPATION:  Multiple severe chains of trigger points B upper traps, paraspinals in cervical and thoracic spine very tight and tender   CERVICAL ROM:  Active ROM A/PROM (deg) eval AROM 08/25/23   Flexion  35*  Extension  3*  Right lateral flexion  10*  Left lateral flexion  10*  Right rotation  27*  Left rotation  18*   (Blank rows = not tested)  Deferred ROM check at eval- will clarify post-op precautions with surgeon prior to AROM- per functional observation, extreme limitations all planes of motion C-spine     UPPER EXTREMITY MMT:  MMT Right eval Left eval Right  Left  Right 08/25/23 Left 08/25/23  Shoulder flexion 4 4 4+ 4+ 5 4+  Shoulder extension        Shoulder abduction 4 4 4+ 4+ 5 5  Shoulder adduction        Shoulder extension        Shoulder internal rotation        Shoulder external rotation        Middle trapezius        Lower trapezius        Elbow flexion 4+ 4+   5 5  Elbow extension 4+ 4+   5 5  Wrist flexion        Wrist extension        Wrist ulnar deviation        Wrist radial deviation        Wrist pronation        Wrist supination        Grip strength        Hip flexion  3 3 4 4 3 3   Quads 4 4+ 4+ 4+ 5 5  Hamstrings 4 4 4+ 4 5 5   Ankle dorsiflexion 5 5       (Blank rows = not tested)    FUNCTIONAL TESTS:  Berg Balance Scale: 32; 08/25/23- 41/56   TODAY'S TREATMENT:                                                                                                                              DATE:  09/29/23 Shrugs Gentle rotations Red tband row Red tband extension Shoulder rolls DN to the upper traps and the cervical area STM to the above  09/21/23 Nustep L6 x 5 min UEs/LEs Modified quadruped thoracic rotation x10 Sitting thoracic rotation 2x5 Sitting thoracic extension with arm support x10 Sitting cervical isometrics rotation 5x5", retraction 5x5", lateral flexion 5x5" STM & TPR bilat UTs, cervical paraspinals Skilled assessment and palpation for TPDN Trigger Point Dry-Needling  Treatment instructions: Expect mild to moderate muscle soreness. S/S of pneumothorax if dry needled over a lung field, and to seek immediate  medical attention should they occur. Patient verbalized understanding of these instructions and education.  Patient Consent Given: Yes Education handout provided: Previously provided Muscles treated: Bilat UTs, cervical paraspinals Electrical stimulation performed: No Parameters: N/A Treatment response/outcome: Decreased muscle tension, twitch response   08/31/23 NuStep L5 x 6 min Lead PT assisted in session performing DN to the  bilateral upper traps and cervical area Side step on and off airex  Alt 8in box taps from airex 2x10 Side steps on balance bean in // bars  08/25/23 Objective measures for recert, education on goals/progress/POC and will continue dry needling in future sessions UBE backwards only L2-4 (limited by neck pain) x6 minutes Available ROM strengthening: cx flexion red TB x10, lateral flexion x10 B   08/12/23 UBE L1 in stand, x 3 min for and back DN provided by Otelia Santee, PT for BUT and cervical paraspinals. Massage gun to B cerv paraspinals and sub occipital area as well as UT, LS, Rhomboids  08/05/23 Lead PT assisted in session performing DN to the bilateral upper traps and cervical area NuStep L 5 x 6 min Alt 6in box taps form airex 2x10 Side step in and off airex Leg press 50lb 2x10  Seated OHP yellow ball   08/03/23 NuStep L 5 x 6 min Checked  Goals  Resisted gait 20lb 4 way x 3 each  07/29/23 NuStep L5 x 6 min Lead PT assisted in session performing DN to the bilateral upper traps and cervical area In parallel bars  Side steps  Tandem walking  Side steps on balance beam    PATIENT EDUCATION:  Education details: exam findings, POC, HEP, awaiting clarification on precautions prior to focus on cervical spine  Person educated: Patient Education method: Explanation, Demonstration, and Handouts Education comprehension: verbalized understanding, returned demonstration, and needs further education  HOME EXERCISE PROGRAM: Access Code:  [email or web address] URL: [email or web address] Date: 06/24/2023 Prepared by: Debroah Baller  Exercises - Seated Scapular Retraction  - 2 x daily - 7 x weekly - 1 sets - 10 reps - 2 seconds  hold - Seated Backward Shoulder Rolls  - 2 x daily - 7 x weekly - 1 sets - 10 reps - Semi-Tandem Corner Balance With Eyes Open  - 2 x daily - 7 x weekly - 1 sets - 4 reps - 30 seconds hold - Seated Hip Abduction with Resistance  - 2 x daily - 7 x weekly - 1 sets - 10 reps - 1 second  hold - Standing Shoulder Flexion AAROM with Dowel  - 1 x daily - 7 x weekly - 3 sets - 10 reps - Standing Shoulder Extension with Dowel  - 1 x daily - 7 x weekly - 3 sets - 10 reps - Standing Bilateral Shoulder Internal Rotation AAROM with Dowel  - 1 x daily - 7 x weekly - 3 sets - 10 reps  ASSESSMENT:  CLINICAL IMPRESSION: Patient had a fall last week, she reports that she feels like she strained the mms in her neck, she is back in the cervical collar, very stiff and guarded.  Continued cervical strengthening with isometrics. Manual work and TPDN to decrease muscle tension and improve cervical ROM.  Has not met any of her LTGs at this time due to lapse in her PT sessions due to a friend's death. Pt will benefit from continued PT to improve pt's overall postural strength to reach her goals.   OBJECTIVE IMPAIRMENTS: Abnormal gait, decreased activity tolerance, decreased balance, decreased mobility, difficulty walking, decreased ROM, decreased strength, hypomobility, increased fascial restrictions, increased muscle spasms, impaired flexibility, and pain.   ACTIVITY LIMITATIONS: carrying, lifting, bending, squatting, stairs, bed mobility, and locomotion level  PARTICIPATION LIMITATIONS: meal prep, cleaning, laundry, shopping, community activity, and yard work  PERSONAL FACTORS: Age, Behavior pattern, Fitness, Past/current experiences, and Time since onset of injury/illness/exacerbation are also affecting  patient's  functional outcome.   REHAB POTENTIAL: Good  CLINICAL DECISION MAKING: Stable/uncomplicated  EVALUATION COMPLEXITY: Low   GOALS: Goals reviewed with patient? Yes  SHORT TERM GOALS: Target date: 09/08/2023    Will be compliant with appropriate progressive HEP  Baseline:  Goal status: Met 07/01/23  2.  Cervical ROM to improve by at least 15 degrees all planes of motion (when cleared to work on ROM by Careers adviser) Baseline:  Goal status: ONGOING 09/29/23  3.  Will be able to maintain tandem stance for 15 seconds with no more than MinA  Baseline:  Goal status: ONGOING 09/29/23  4.  Soft tissue restrictions and trigger points to have improved by at least 50% to assist in pain management  Baseline:  Goal status: Met 08/03/23    LONG TERM GOALS: Target date: 10/19/2023    MMT to have improved by 1 grade in all weak groups  Baseline:  Goal status: ONGOING 09/29/23  2.  Will score at least 45 on Berg to show reduced fall risk  Baseline:  Goal status: ONGOING 09/29/23  3.  Pain in cervical spine to be no more than 2/10 at worst  Baseline:  Goal status: ONGOING 09/29/23  4.  Will be compliant with appropriate gym exercise program to maintain functional gains after DC from PT  Baseline:  Goal status: ONGOING 09/29/23     PLAN:  PT FREQUENCY: 1-2x/week  PT DURATION: 4 weeks  PLANNED INTERVENTIONS: Therapeutic exercises, Therapeutic activity, Neuromuscular re-education, Gait training, Self Care, Dry Needling, Cryotherapy, Moist heat, and Manual therapy  PLAN FOR NEXT SESSION: Needs better balance, still working on neck pain and stiffness and the UE weakness. Need to work on specific cervical strengthening. Continue dry needling, really helps pain   Jearld Lesch, PT 09/29/23 5:03 PM

## 2023-10-12 ENCOUNTER — Ambulatory Visit: Payer: Medicare Other | Admitting: Physical Therapy

## 2023-10-14 ENCOUNTER — Ambulatory Visit: Payer: Medicare Other | Admitting: Podiatry

## 2023-10-18 ENCOUNTER — Encounter: Payer: Self-pay | Admitting: Physical Therapy

## 2023-10-18 ENCOUNTER — Ambulatory Visit: Payer: Medicare Other | Attending: Orthopaedic Surgery | Admitting: Physical Therapy

## 2023-10-18 DIAGNOSIS — R262 Difficulty in walking, not elsewhere classified: Secondary | ICD-10-CM | POA: Insufficient documentation

## 2023-10-18 DIAGNOSIS — M6281 Muscle weakness (generalized): Secondary | ICD-10-CM | POA: Insufficient documentation

## 2023-10-18 DIAGNOSIS — M5412 Radiculopathy, cervical region: Secondary | ICD-10-CM | POA: Insufficient documentation

## 2023-10-18 DIAGNOSIS — M542 Cervicalgia: Secondary | ICD-10-CM | POA: Diagnosis present

## 2023-10-18 DIAGNOSIS — R2681 Unsteadiness on feet: Secondary | ICD-10-CM | POA: Diagnosis present

## 2023-10-18 NOTE — Therapy (Signed)
 OUTPATIENT PHYSICAL THERAPY CERVICAL TREATMENT   Patient Name: Becky Hobbs MRN: [id number] DOB:[birth year], 78 y.o., female Today's Date: 10/18/2023  END OF SESSION:  PT End of Session - 10/18/23 1621     Visit Number 17    Number of Visits 19    Date for PT Re-Evaluation 10/17/22    Authorization Type [employer] MCR    Authorization Time Period 3/4 10/18/23    PT Start Time 1615    PT Stop Time 1700    PT Time Calculation (min) 45 min    Activity Tolerance Patient tolerated treatment well    Behavior During Therapy WFL for tasks assessed/performed              Past Medical History:  Diagnosis Date   Anemia    Anxiety    panic attacks   Arthritis    Asthma    Blood transfusion    1973--with twins birth   Bronchitis    Chronic kidney disease    Diabetes mellitus    borderline   GERD (gastroesophageal reflux disease)    H/O hiatal hernia    surgery fixed   Headache    migraines   Heart murmur    MVP   Hepatitis    Hep C   Hypertension    Hypothyroidism    Pneumonia    Past Surgical History:  Procedure Laterality Date   ABDOMINAL HYSTERECTOMY     APPENDECTOMY     APPLICATION OF INTRAOPERATIVE CT SCAN N/A 06/05/2021   Procedure: APPLICATION OF INTRAOPERATIVE CT SCAN;  Surgeon: Carollee Lani BROCKS, DO;  Location: [facility];  Service: Neurosurgery;  Laterality: N/A;   BACK SURGERY     bladder tack     BREAST CYST EXCISION Right    BREAST CYST INCISION AND DRAINAGE     BREAST SURGERY     BUNIONECTOMY     CHOLECYSTECTOMY     DILATION AND CURETTAGE OF UTERUS     ESOPHAGOGASTRODUODENOSCOPY (EGD) WITH PROPOFOL  N/A 01/03/2019   Procedure: ESOPHAGOGASTRODUODENOSCOPY (EGD) WITH PROPOFOL ;  Surgeon: Elicia Claw, MD;  Location: [facility];  Service: Gastroenterology;  Laterality: N/A;   FOREIGN BODY REMOVAL  01/03/2019   Procedure: FOREIGN BODY REMOVAL;  Surgeon: Elicia Claw, MD;  Location: [facility];  Service: Gastroenterology;;   FRACTURE SURGERY     left  wrist   HERNIA REPAIR     hiatal hernia   LUMBAR FUSION  07/26/2017   LUMBAR LAMINECTOMY  03/04/2012   Procedure: MICRODISCECTOMY LUMBAR LAMINECTOMY;  Surgeon: Lynwood FORBES Better, MD;  Location: [facility];  Service: Orthopedics;  Laterality: N/A;  L3-4 central laminectomy   NASAL SEPTUM SURGERY     nissan fundoplication     OVARIAN CYST SURGERY     POSTERIOR CERVICAL FUSION/FORAMINOTOMY N/A 06/05/2021   Procedure: OCCIPITAL- CERVICAL TWO INSTRUMENTATION AND FUSION; EXTENSION TO CERVICAL FIVE;  Surgeon: Carollee Lani BROCKS, DO;  Location: [facility];  Service: Neurosurgery;  Laterality: N/A;   THORACIC FUSION  07/26/2017   TONSILLECTOMY     Patient Active Problem List   Diagnosis Date Noted   Chronic incomplete quadriplegia (HCC) 08/12/2022   Spasticity 08/12/2022   Chronic kidney disease (CKD), stage IV (severe) (HCC) 08/12/2022   Neurogenic bladder 04/03/2022   Spondylosis, cervical, with myelopathy 04/03/2022   Myofascial pain dysfunction syndrome 12/17/2021   Hepatic cirrhosis (HCC) 06/26/2021   Fracture of C1 vertebra, closed (HCC) 06/09/2021   Closed C1 fracture (HCC) 06/05/2021   Plantar fasciitis of left foot 08/21/2018  Acute renal failure (ARF) (HCC) 02/09/2018   Chronic migraine without aura without status migrainosus, not intractable 06/16/2017   History of lumbar fusion 08/03/2016    Class: Chronic   Migraine 04/09/2016   Acute-on-chronic kidney injury (HCC) 09/07/2015   Hypertension 09/07/2015   Hypothyroidism 09/07/2015   Gout 09/07/2015   Low back pain 09/07/2015   Obesity (BMI 30-39.9) 07/06/2013   Headache 04/12/2013   Concussion 04/12/2013   Lumbar spinal stenosis 03/04/2012    Class: Diagnosis of    PCP: Cornelio Bouchard MD   REFERRING PROVIDER: Gust Royden ORN, MD  REFERRING DIAG: Cervical Fusion  THERAPY DIAG:  Muscle weakness (generalized)  Cervicalgia  Unsteadiness on feet  Difficulty in walking, not elsewhere classified  Radiculopathy, cervical  region  Rationale for Evaluation and Treatment: Rehabilitation  ONSET DATE: cervical fusion 02/01/23  SUBJECTIVE:                                                                                                                                                                                                         SUBJECTIVE STATEMENT: Pt reports that she had another fall, she again describes turning fast in the kitchen and falling, reports that she has been very stiff since the fall and with some increased neck pain, reports that she really strained the neck and reports she developed gout in the right foot and ankle and could not walk much due to pain, reports 2 falls in the past month  Hand dominance: Right  PERTINENT HISTORY:  Anxiety/panic attacks, OA, CKD, DM, hernia s/p surgical repair, hepatitis, HTN, hypothyroidism, back surgery, bunionectomy, cholecystectomy, L wrist fracture surgery, lumbar fusion, lumbar laminectomy, posterior cervical fusion/foraminotomy, ACDF 4/24  PAIN:  Are you having pain? No 0/10 now   PRECAUTIONS: activity as tolerated per evaluating PT discussion with surgeon   RED FLAGS: None     WEIGHT BEARING RESTRICTIONS: No  FALLS:  Has patient fallen in last 6 months? No  LIVING ENVIRONMENT: Lives with: lives alone Lives in: House/apartment Stairs: flight inside house, 4STE  Has following equipment at home: Single point cane and Environmental Consultant - 4 wheeled  OCCUPATION: retired   PLOF: Independent, Independent with basic ADLs, Independent with gait, and Independent with transfers  PATIENT GOALS: feel better, be able to get my neck right, get my balance right, no falls   NEXT MD VISIT: Dr. Gust October 30th   OBJECTIVE:  COGNITION: Overall cognitive status: Within functional limits for tasks assessed  SENSATION: Not tested  POSTURE: rounded shoulders, forward head, decreased lumbar lordosis, increased thoracic kyphosis,  and flexed trunk    PALPATION:  Multiple severe chains of trigger points B upper traps, paraspinals in cervical and thoracic spine very tight and tender   CERVICAL ROM:   Active ROM A/PROM (deg) eval AROM 08/25/23  Flexion  35*  Extension  3*  Right lateral flexion  10*  Left lateral flexion  10*  Right rotation  27*  Left rotation  18*   (Blank rows = not tested)  Deferred ROM check at eval- will clarify post-op precautions with surgeon prior to AROM- per functional observation, extreme limitations all planes of motion C-spine     UPPER EXTREMITY MMT:  MMT Right eval Left eval Right  Left  Right 08/25/23 Left 08/25/23  Shoulder flexion 4 4 4+ 4+ 5 4+  Shoulder extension        Shoulder abduction 4 4 4+ 4+ 5 5  Shoulder adduction        Shoulder extension        Shoulder internal rotation        Shoulder external rotation        Middle trapezius        Lower trapezius        Elbow flexion 4+ 4+   5 5  Elbow extension 4+ 4+   5 5  Wrist flexion        Wrist extension        Wrist ulnar deviation        Wrist radial deviation        Wrist pronation        Wrist supination        Grip strength        Hip flexion  3 3 4 4 3 3   Quads 4 4+ 4+ 4+ 5 5  Hamstrings 4 4 4+ 4 5 5   Ankle dorsiflexion 5 5       (Blank rows = not tested)    FUNCTIONAL TESTS:  Berg Balance Scale: 32; 08/25/23- 41/56,  10/18/23 = 40/56  TUG:  10/18/23 = 19 seconds TODAY'S TREATMENT:                                                                                                                              DATE:  10/18/23 Nustep level 5 x 5 minutes In pbars step turn touch [employer], one loss of balance required Mod A to correct fall Side step on and off airex On airex reaching On airex red tband row and extension STM to the neck/upper traps, gentle stretches  09/29/23 Shrugs Gentle rotations Red tband row Red tband extension Shoulder rolls DN to the upper traps and the cervical area STM  to the above  09/21/23 Nustep L6 x 5 min UEs/LEs Modified quadruped thoracic rotation x10 Sitting thoracic rotation 2x5 Sitting thoracic extension with arm support x10 Sitting cervical isometrics rotation 5x5, retraction 5x5, lateral flexion 5x5 STM & TPR bilat UTs, cervical paraspinals Skilled  assessment and palpation for TPDN Trigger Point Dry-Needling  Treatment instructions: Expect mild to moderate muscle soreness. S/S of pneumothorax if dry needled over a lung field, and to seek immediate medical attention should they occur. Patient verbalized understanding of these instructions and education.  Patient Consent Given: Yes Education handout provided: Previously provided Muscles treated: Bilat UTs, cervical paraspinals Electrical stimulation performed: No Parameters: N/A Treatment response/outcome: Decreased muscle tension, twitch response   08/31/23 NuStep L5 x 6 min Lead PT assisted in session performing DN to the bilateral upper traps and cervical area Side step on and off airex  Alt 8in box taps from airex 2x10 Side steps on balance bean in // bars  08/25/23 Objective measures for recert, education on goals/progress/POC and will continue dry needling in future sessions UBE backwards only L2-4 (limited by neck pain) x6 minutes Available ROM strengthening: cx flexion red TB x10, lateral flexion x10 B  PATIENT EDUCATION:  Education details: exam findings, POC, HEP, awaiting clarification on precautions prior to focus on cervical spine  Person educated: Patient Education method: Explanation, Demonstration, and Handouts Education comprehension: verbalized understanding, returned demonstration, and needs further education  HOME EXERCISE PROGRAM: Access Code: [email or web address] URL: [email or web address] Date: 06/24/2023 Prepared by: Tanda Sorrow  Exercises - Seated Scapular Retraction  - 2 x daily - 7 x weekly - 1 sets - 10 reps - 2 seconds  hold - Seated  Backward Shoulder Rolls  - 2 x daily - 7 x weekly - 1 sets - 10 reps - Semi-Tandem Corner Balance With Eyes Open  - 2 x daily - 7 x weekly - 1 sets - 4 reps - 30 seconds hold - Seated Hip Abduction with Resistance  - 2 x daily - 7 x weekly - 1 sets - 10 reps - 1 second  hold - Standing Shoulder Flexion AAROM with Dowel  - 1 x daily - 7 x weekly - 3 sets - 10 reps - Standing Shoulder Extension with Dowel  - 1 x daily - 7 x weekly - 3 sets - 10 reps - Standing Bilateral Shoulder Internal Rotation AAROM with Dowel  - 1 x daily - 7 x weekly - 3 sets - 10 reps  ASSESSMENT:  CLINICAL IMPRESSION: Patient reports that she has had 2 falls in the past month, typically she reports that she is turning around in the kitchen.  She reports that she strained her neck so her neck is hurting again, reports was feeling better, still better than when we started.  She is stiff and guarded in the neck mms.  She also reports that she got gout in her right ankle and foot and really had some pain with this and was not able to walk much.  I retested the Lars and she is 40/56 compared to 41/56 prior.    Has not met any of her LTGs at this time due to lapse in her PT sessions due to a friend's death 2 more falls and gout that she has been dealing with Pt will benefit from continued PT to improve pt's overall postural strength to reach her goals.   OBJECTIVE IMPAIRMENTS: Abnormal gait, decreased activity tolerance, decreased balance, decreased mobility, difficulty walking, decreased ROM, decreased strength, hypomobility, increased fascial restrictions, increased muscle spasms, impaired flexibility, and pain.   ACTIVITY LIMITATIONS: carrying, lifting, bending, squatting, stairs, bed mobility, and locomotion level  PARTICIPATION LIMITATIONS: meal prep, cleaning, laundry, shopping, community activity, and yard work  PERSONAL FACTORS: Age, Behavior pattern, Fitness, Past/current experiences, and  Time since onset of  injury/illness/exacerbation are also affecting patient's functional outcome.   REHAB POTENTIAL: Good  CLINICAL DECISION MAKING: Stable/uncomplicated  EVALUATION COMPLEXITY: Low   GOALS: Goals reviewed with patient? Yes  SHORT TERM GOALS: Target date: 09/08/2023    Will be compliant with appropriate progressive HEP  Baseline:  Goal status: Met 07/01/23  2.  Cervical ROM to improve by at least 15 degrees all planes of motion (when cleared to work on ROM by careers adviser) Baseline:  Goal status: ONGOING 10/18/23  3.  Will be able to maintain tandem stance for 15 seconds with no more than MinA  Baseline: able to do 10 seconds 10/18/23 Goal status: progressing 10/18/23  4.  Soft tissue restrictions and trigger points to have improved by at least 50% to assist in pain management  Baseline:  Goal status: Met 08/03/23  LONG TERM GOALS: Target date: 10/19/2023    MMT to have improved by 1 grade in all weak groups  Baseline:  Goal status: progressing 10/18/23  2.  Will score at least 45 on Berg to show reduced fall risk  Baseline: 2 recent falls Goal status: ONGOING 1/6/3  3.  Pain in cervical spine to be no more than 2/10 at worst  Baseline:  Goal status: progressing 10/18/23  4.  Will be compliant with appropriate gym exercise program to maintain functional gains after DC from PT  Baseline:  Goal status: progressing 10/17/22     PLAN:  PT FREQUENCY: 1-2x/week  PT DURATION: 4 weeks  PLANNED INTERVENTIONS: Therapeutic exercises, Therapeutic activity, Neuromuscular re-education, Gait training, Self Care, Dry Needling, Cryotherapy, Moist heat, and Manual therapy  PLAN FOR NEXT SESSION: Needs better balance, still working on neck pain and stiffness and the UE weakness. Need to work on specific cervical strengthening. I requested more visits to help her balance and overall safety  OBADIAH OZELL ORN, PT 10/18/23 4:22 PM

## 2023-11-02 ENCOUNTER — Ambulatory Visit: Payer: Medicare Other | Admitting: Physical Therapy

## 2023-11-02 DIAGNOSIS — R2681 Unsteadiness on feet: Secondary | ICD-10-CM

## 2023-11-02 DIAGNOSIS — M542 Cervicalgia: Secondary | ICD-10-CM

## 2023-11-02 DIAGNOSIS — M6281 Muscle weakness (generalized): Secondary | ICD-10-CM

## 2023-11-02 DIAGNOSIS — R262 Difficulty in walking, not elsewhere classified: Secondary | ICD-10-CM

## 2023-11-02 DIAGNOSIS — M5412 Radiculopathy, cervical region: Secondary | ICD-10-CM

## 2023-11-02 NOTE — Therapy (Signed)
OUTPATIENT PHYSICAL THERAPY CERVICAL TREATMENT   Patient Name: Becky Hobbs MRN: [id number] DOB:June 05, 1946, 78 y.o., female Today's Date: 11/02/2023  END OF SESSION:  PT End of Session - 11/02/23 1601     Visit Number 18    Number of Visits 19    Authorization Type [employer] MCR    Authorization Time Period 3/4 10/18/23    PT Start Time 1602    PT Stop Time 1645    PT Time Calculation (min) 43 min    Activity Tolerance Patient tolerated treatment well    Behavior During Therapy WFL for tasks assessed/performed               Past Medical History:  Diagnosis Date   Anemia    Anxiety    panic attacks   Arthritis    Asthma    Blood transfusion    1973--with twins birth   Bronchitis    Chronic kidney disease    Diabetes mellitus    borderline   GERD (gastroesophageal reflux disease)    H/O hiatal hernia    surgery fixed   Headache    migraines   Heart murmur    MVP   Hepatitis    Hep C   Hypertension    Hypothyroidism    Pneumonia    Past Surgical History:  Procedure Laterality Date   ABDOMINAL HYSTERECTOMY     APPENDECTOMY     APPLICATION OF INTRAOPERATIVE CT SCAN N/A 06/05/2021   Procedure: APPLICATION OF INTRAOPERATIVE CT SCAN;  Surgeon: Bethann Goo, DO;  Location: [facility] OR;  Service: Neurosurgery;  Laterality: N/A;   BACK SURGERY     bladder tack     BREAST CYST EXCISION Right    BREAST CYST INCISION AND DRAINAGE     BREAST SURGERY     BUNIONECTOMY     CHOLECYSTECTOMY     DILATION AND CURETTAGE OF UTERUS     ESOPHAGOGASTRODUODENOSCOPY (EGD) WITH PROPOFOL N/A 01/03/2019   Procedure: ESOPHAGOGASTRODUODENOSCOPY (EGD) WITH PROPOFOL;  Surgeon: Kathi Der, MD;  Location: [facility];  Service: Gastroenterology;  Laterality: N/A;   FOREIGN BODY REMOVAL  01/03/2019   Procedure: FOREIGN BODY REMOVAL;  Surgeon: Kathi Der, MD;  Location: [facility];  Service: Gastroenterology;;   FRACTURE SURGERY     left wrist   HERNIA REPAIR     hiatal  hernia   LUMBAR FUSION  07/26/2017   LUMBAR LAMINECTOMY  03/04/2012   Procedure: MICRODISCECTOMY LUMBAR LAMINECTOMY;  Surgeon: Kerrin Champagne, MD;  Location: [facility] OR;  Service: Orthopedics;  Laterality: N/A;  L3-4 central laminectomy   NASAL SEPTUM SURGERY     nissan fundoplication     OVARIAN CYST SURGERY     POSTERIOR CERVICAL FUSION/FORAMINOTOMY N/A 06/05/2021   Procedure: OCCIPITAL- CERVICAL TWO INSTRUMENTATION AND FUSION; EXTENSION TO CERVICAL FIVE;  Surgeon: Bethann Goo, DO;  Location: [facility];  Service: Neurosurgery;  Laterality: N/A;   THORACIC FUSION  07/26/2017   TONSILLECTOMY     Patient Active Problem List   Diagnosis Date Noted   Chronic incomplete quadriplegia (HCC) 08/12/2022   Spasticity 08/12/2022   Chronic kidney disease (CKD), stage IV (severe) (HCC) 08/12/2022   Neurogenic bladder 04/03/2022   Spondylosis, cervical, with myelopathy 04/03/2022   Myofascial pain dysfunction syndrome 12/17/2021   Hepatic cirrhosis (HCC) 06/26/2021   Fracture of C1 vertebra, closed (HCC) 06/09/2021   Closed C1 fracture (HCC) 06/05/2021   Plantar fasciitis of left foot 08/21/2018   Acute renal failure (ARF) (HCC)  02/09/2018   Chronic migraine without aura without status migrainosus, not intractable 06/16/2017   History of lumbar fusion 08/03/2016    Class: Chronic   Migraine 04/09/2016   Acute-on-chronic kidney injury (HCC) 09/07/2015   Hypertension 09/07/2015   Hypothyroidism 09/07/2015   Gout 09/07/2015   Low back pain 09/07/2015   Obesity (BMI 30-39.9) 07/06/2013   Headache 04/12/2013   Concussion 04/12/2013   Lumbar spinal stenosis 03/04/2012    Class: Diagnosis of    PCP: Genice Rouge MD   REFERRING PROVIDER: Patricia Nettle, MD  REFERRING DIAG: Cervical Fusion  THERAPY DIAG:  Muscle weakness (generalized)  Cervicalgia  Unsteadiness on feet  Difficulty in walking, not elsewhere classified  Radiculopathy, cervical region  Rationale for Evaluation and Treatment:  Rehabilitation  ONSET DATE: cervical fusion 02/01/23  SUBJECTIVE:                                                                                                                                                                                                         SUBJECTIVE STATEMENT: Pt reports she had another fall last night -- tripped and fell this time. Got new orders from physician. Saw Dr. Noel Gerold yesterday.   Hand dominance: Right  PERTINENT HISTORY:  Anxiety/panic attacks, OA, CKD, DM, hernia s/p surgical repair, hepatitis, HTN, hypothyroidism, back surgery, bunionectomy, cholecystectomy, L wrist fracture surgery, lumbar fusion, lumbar laminectomy, posterior cervical fusion/foraminotomy, ACDF 4/24  PAIN:  Are you having pain? No 0/10 now   PRECAUTIONS: activity as tolerated per evaluating PT discussion with surgeon   RED FLAGS: None     WEIGHT BEARING RESTRICTIONS: No  FALLS:  Has patient fallen in last 6 months? No  LIVING ENVIRONMENT: Lives with: lives alone Lives in: House/apartment Stairs: flight inside house, 4STE  Has following equipment at home: Single point cane and Environmental consultant - 4 wheeled  OCCUPATION: retired   PLOF: Independent, Independent with basic ADLs, Independent with gait, and Independent with transfers  PATIENT GOALS: feel better, be able to get my neck right, get my balance right, no falls   NEXT MD VISIT: Dr. Noel Gerold October 30th   OBJECTIVE:  COGNITION: Overall cognitive status: Within functional limits for tasks assessed  SENSATION: Not tested  POSTURE: rounded shoulders, forward head, decreased lumbar lordosis, increased thoracic kyphosis, and flexed trunk   PALPATION:  Multiple severe chains of trigger points B upper traps, paraspinals in cervical and thoracic spine very tight and tender   CERVICAL ROM:   Active ROM A/PROM (deg) eval AROM 08/25/23  Flexion  35*  Extension  3*  Right lateral flexion  10*  Left lateral flexion  10*   Right rotation  27*  Left rotation  18*   (Blank rows = not tested)  Deferred ROM check at eval- will clarify post-op precautions with surgeon prior to AROM- per functional observation, extreme limitations all planes of motion C-spine     UPPER EXTREMITY MMT:  MMT Right eval Left eval Right  Left  Right 08/25/23 Left 08/25/23  Shoulder flexion 4 4 4+ 4+ 5 4+  Shoulder extension        Shoulder abduction 4 4 4+ 4+ 5 5  Shoulder adduction        Shoulder extension        Shoulder internal rotation        Shoulder external rotation        Middle trapezius        Lower trapezius        Elbow flexion 4+ 4+   5 5  Elbow extension 4+ 4+   5 5  Wrist flexion        Wrist extension        Wrist ulnar deviation        Wrist radial deviation        Wrist pronation        Wrist supination        Grip strength        Hip flexion  3 3 4 4 3 3   Quads 4 4+ 4+ 4+ 5 5  Hamstrings 4 4 4+ 4 5 5   Ankle dorsiflexion 5 5       (Blank rows = not tested)    FUNCTIONAL TESTS:  Berg Balance Scale: 32; 08/25/23- 41/56,  10/18/23 = 40/56  TUG:  10/18/23 = 19 seconds TODAY'S TREATMENT:                                                                                                                              DATE:  11/02/23 Nustep L5 x 6 min UEs/LEs Row red TB 2x10 Shoulder ext red TB 2x10 Shoulder ER red TB 2x10 "W" red TB 2x10 Cervical retraction 2x10 Sitting hip flexion red TB 2x10 Standing step back/turn x10 Standing tandem stance x30 sec Standing on L LE flexing R LE x10 STM & TPR bilat UTs and cervical paraspinals Trigger Point Dry Needling  Subsequent Treatment: Instructions provided previously at initial dry needling treatment.   Patient Verbal Consent Given: Yes Education Handout Provided: Previously Provided Muscles Treated: Bilat UT and cervical paraspinals Electrical Stimulation Performed: No Treatment Response/Outcome: Twitch response, decreased muscle  tension    10/18/23 Nustep level 5 x 5 minutes In pbars step turn touch [employer], one loss of balance required Mod A to correct fall Side step on and off airex On airex reaching On airex red tband row and extension STM to the neck/upper traps, gentle stretches  09/29/23 Shrugs Gentle rotations Red tband row Red  tband extension Shoulder rolls DN to the upper traps and the cervical area STM to the above  09/21/23 Nustep L6 x 5 min UEs/LEs Modified quadruped thoracic rotation x10 Sitting thoracic rotation 2x5 Sitting thoracic extension with arm support x10 Sitting cervical isometrics rotation 5x5", retraction 5x5", lateral flexion 5x5" STM & TPR bilat UTs, cervical paraspinals Skilled assessment and palpation for TPDN Trigger Point Dry-Needling  Treatment instructions: Expect mild to moderate muscle soreness. S/S of pneumothorax if dry needled over a lung field, and to seek immediate medical attention should they occur. Patient verbalized understanding of these instructions and education.  Patient Consent Given: Yes Education handout provided: Previously provided Muscles treated: Bilat UTs, cervical paraspinals Electrical stimulation performed: No Parameters: N/A Treatment response/outcome: Decreased muscle tension, twitch response   08/31/23 NuStep L5 x 6 min Lead PT assisted in session performing DN to the bilateral upper traps and cervical area Side step on and off airex  Alt 8in box taps from airex 2x10 Side steps on balance bean in // bars  08/25/23 Objective measures for recert, education on goals/progress/POC and will continue dry needling in future sessions UBE backwards only L2-4 (limited by neck pain) x6 minutes Available ROM strengthening: cx flexion red TB x10, lateral flexion x10 B  PATIENT EDUCATION:  Education details: exam findings, POC, HEP, awaiting clarification on precautions prior to focus on cervical spine  Person educated:  Patient Education method: Explanation, Demonstration, and Handouts Education comprehension: verbalized understanding, returned demonstration, and needs further education  HOME EXERCISE PROGRAM: Access Code: [email or web address] URL: [email or web address] Date: 06/24/2023 Prepared by: Debroah Baller  Exercises - Seated Scapular Retraction  - 2 x daily - 7 x weekly - 1 sets - 10 reps - 2 seconds  hold - Seated Backward Shoulder Rolls  - 2 x daily - 7 x weekly - 1 sets - 10 reps - Semi-Tandem Corner Balance With Eyes Open  - 2 x daily - 7 x weekly - 1 sets - 4 reps - 30 seconds hold - Seated Hip Abduction with Resistance  - 2 x daily - 7 x weekly - 1 sets - 10 reps - 1 second  hold - Standing Shoulder Flexion AAROM with Dowel  - 1 x daily - 7 x weekly - 3 sets - 10 reps - Standing Shoulder Extension with Dowel  - 1 x daily - 7 x weekly - 3 sets - 10 reps - Standing Bilateral Shoulder Internal Rotation AAROM with Dowel  - 1 x daily - 7 x weekly - 3 sets - 10 reps  ASSESSMENT:  CLINICAL IMPRESSION: Pt reports another fall tripping at home. L LE feels weak to pt with stepping/weight bearing. Added midback strengthening and balance exercises to her HEP on medbridge. Continued TPDN as this continues to help manage pt's pain and spasm.   OBJECTIVE IMPAIRMENTS: Abnormal gait, decreased activity tolerance, decreased balance, decreased mobility, difficulty walking, decreased ROM, decreased strength, hypomobility, increased fascial restrictions, increased muscle spasms, impaired flexibility, and pain.   ACTIVITY LIMITATIONS: carrying, lifting, bending, squatting, stairs, bed mobility, and locomotion level  PARTICIPATION LIMITATIONS: meal prep, cleaning, laundry, shopping, community activity, and yard work  PERSONAL FACTORS: Age, Behavior pattern, Fitness, Past/current experiences, and Time since onset of injury/illness/exacerbation are also affecting patient's functional outcome.   REHAB  POTENTIAL: Good  CLINICAL DECISION MAKING: Stable/uncomplicated  EVALUATION COMPLEXITY: Low   GOALS: Goals reviewed with patient? Yes  SHORT TERM GOALS: Target date: 09/08/2023    Will be compliant with appropriate progressive HEP  Baseline:  Goal status: Met 07/01/23  2.  Cervical ROM to improve by at least 15 degrees all planes of motion (when cleared to work on ROM by Careers adviser) Baseline:  Goal status: ONGOING 10/18/23  3.  Will be able to maintain tandem stance for 15 seconds with no more than MinA  Baseline: able to do 10 seconds 10/18/23 Goal status: progressing 10/18/23  4.  Soft tissue restrictions and trigger points to have improved by at least 50% to assist in pain management  Baseline:  Goal status: Met 08/03/23  LONG TERM GOALS: Target date: 12/28/23    MMT to have improved by 1 grade in all weak groups  Baseline:  Goal status: progressing 10/18/23  2.  Will score at least 45 on Berg to show reduced fall risk  Baseline: 2 recent falls Goal status: ONGOING 1/6/3  3.  Pain in cervical spine to be no more than 2/10 at worst  Baseline:  Goal status: progressing 10/18/23  4.  Will be compliant with appropriate gym exercise program to maintain functional gains after DC from PT  Baseline:  Goal status: progressing 10/17/22     PLAN:  PT FREQUENCY: Every other week (limited due to insurance restrictions)  PT DURATION: 8 weeks  PLANNED INTERVENTIONS: Therapeutic exercises, Therapeutic activity, Neuromuscular re-education, Gait training, Self Care, Dry Needling, Cryotherapy, Moist heat, and Manual therapy  PLAN FOR NEXT SESSION: Needs better balance, still working on neck pain and stiffness and the UE weakness. Need to work on specific cervical strengthening. I requested more visits to help her balance and overall safety  Becky Hobbs Becky Hobbs, PT 11/02/23 4:02 PM

## 2023-11-09 ENCOUNTER — Ambulatory Visit: Payer: Medicare Other | Admitting: Physical Therapy

## 2023-11-16 ENCOUNTER — Ambulatory Visit: Payer: Medicare Other | Attending: Orthopaedic Surgery | Admitting: Physical Therapy

## 2023-11-16 ENCOUNTER — Encounter: Payer: Self-pay | Admitting: Physical Therapy

## 2023-11-16 DIAGNOSIS — R262 Difficulty in walking, not elsewhere classified: Secondary | ICD-10-CM | POA: Insufficient documentation

## 2023-11-16 DIAGNOSIS — R2681 Unsteadiness on feet: Secondary | ICD-10-CM | POA: Insufficient documentation

## 2023-11-16 DIAGNOSIS — M542 Cervicalgia: Secondary | ICD-10-CM | POA: Diagnosis present

## 2023-11-16 DIAGNOSIS — M6281 Muscle weakness (generalized): Secondary | ICD-10-CM | POA: Diagnosis present

## 2023-11-16 DIAGNOSIS — M5412 Radiculopathy, cervical region: Secondary | ICD-10-CM | POA: Insufficient documentation

## 2023-11-16 NOTE — Therapy (Signed)
 OUTPATIENT PHYSICAL THERAPY CERVICAL TREATMENT   Patient Name: Becky Hobbs MRN: [id number] DOB:November 12, 1945, 78 y.o., female Today's Date: 11/16/2023  END OF SESSION:  PT End of Session - 11/16/23 1619     Visit Number 19    Date for PT Re-Evaluation 12/28/23    PT Start Time 1611    PT Stop Time 1700    PT Time Calculation (min) 49 min    Activity Tolerance Patient tolerated treatment well    Behavior During Therapy WFL for tasks assessed/performed               Past Medical History:  Diagnosis Date   Anemia    Anxiety    panic attacks   Arthritis    Asthma    Blood transfusion    1973--with twins birth   Bronchitis    Chronic kidney disease    Diabetes mellitus    borderline   GERD (gastroesophageal reflux disease)    H/O hiatal hernia    surgery fixed   Headache    migraines   Heart murmur    MVP   Hepatitis    Hep C   Hypertension    Hypothyroidism    Pneumonia    Past Surgical History:  Procedure Laterality Date   ABDOMINAL HYSTERECTOMY     APPENDECTOMY     APPLICATION OF INTRAOPERATIVE CT SCAN N/A 06/05/2021   Procedure: APPLICATION OF INTRAOPERATIVE CT SCAN;  Surgeon: Carollee Lani BROCKS, DO;  Location: [facility] OR;  Service: Neurosurgery;  Laterality: N/A;   BACK SURGERY     bladder tack     BREAST CYST EXCISION Right    BREAST CYST INCISION AND DRAINAGE     BREAST SURGERY     BUNIONECTOMY     CHOLECYSTECTOMY     DILATION AND CURETTAGE OF UTERUS     ESOPHAGOGASTRODUODENOSCOPY (EGD) WITH PROPOFOL  N/A 01/03/2019   Procedure: ESOPHAGOGASTRODUODENOSCOPY (EGD) WITH PROPOFOL ;  Surgeon: Elicia Claw, MD;  Location: [facility];  Service: Gastroenterology;  Laterality: N/A;   FOREIGN BODY REMOVAL  01/03/2019   Procedure: FOREIGN BODY REMOVAL;  Surgeon: Elicia Claw, MD;  Location: [facility];  Service: Gastroenterology;;   FRACTURE SURGERY     left wrist   HERNIA REPAIR     hiatal hernia   LUMBAR FUSION  07/26/2017   LUMBAR LAMINECTOMY   03/04/2012   Procedure: MICRODISCECTOMY LUMBAR LAMINECTOMY;  Surgeon: Lynwood FORBES Better, MD;  Location: [facility];  Service: Orthopedics;  Laterality: N/A;  L3-4 central laminectomy   NASAL SEPTUM SURGERY     nissan fundoplication     OVARIAN CYST SURGERY     POSTERIOR CERVICAL FUSION/FORAMINOTOMY N/A 06/05/2021   Procedure: OCCIPITAL- CERVICAL TWO INSTRUMENTATION AND FUSION; EXTENSION TO CERVICAL FIVE;  Surgeon: Carollee Lani BROCKS, DO;  Location: [facility];  Service: Neurosurgery;  Laterality: N/A;   THORACIC FUSION  07/26/2017   TONSILLECTOMY     Patient Active Problem List   Diagnosis Date Noted   Chronic incomplete quadriplegia (HCC) 08/12/2022   Spasticity 08/12/2022   Chronic kidney disease (CKD), stage IV (severe) (HCC) 08/12/2022   Neurogenic bladder 04/03/2022   Spondylosis, cervical, with myelopathy 04/03/2022   Myofascial pain dysfunction syndrome 12/17/2021   Hepatic cirrhosis (HCC) 06/26/2021   Fracture of C1 vertebra, closed (HCC) 06/09/2021   Closed C1 fracture (HCC) 06/05/2021   Plantar fasciitis of left foot 08/21/2018   Acute renal failure (ARF) (HCC) 02/09/2018   Chronic migraine without aura without status migrainosus, not intractable 06/16/2017  History of lumbar fusion 08/03/2016    Class: Chronic   Migraine 04/09/2016   Acute-on-chronic kidney injury (HCC) 09/07/2015   Hypertension 09/07/2015   Hypothyroidism 09/07/2015   Gout 09/07/2015   Low back pain 09/07/2015   Obesity (BMI 30-39.9) 07/06/2013   Headache 04/12/2013   Concussion 04/12/2013   Lumbar spinal stenosis 03/04/2012    Class: Diagnosis of    PCP: Cornelio Bouchard MD   REFERRING PROVIDER: Gust Royden ORN, MD  REFERRING DIAG: Cervical Fusion  THERAPY DIAG:  Muscle weakness (generalized)  Cervicalgia  Unsteadiness on feet  Difficulty in walking, not elsewhere classified  Rationale for Evaluation and Treatment: Rehabilitation  ONSET DATE: cervical fusion 02/01/23  SUBJECTIVE:                                                                                                                                                                                                          SUBJECTIVE STATEMENT: Feeling pretty good, no falls recently   Hand dominance: Right  PERTINENT HISTORY:  Anxiety/panic attacks, OA, CKD, DM, hernia s/p surgical repair, hepatitis, HTN, hypothyroidism, back surgery, bunionectomy, cholecystectomy, L wrist fracture surgery, lumbar fusion, lumbar laminectomy, posterior cervical fusion/foraminotomy, ACDF 4/24  PAIN:  Are you having pain? No 0/10 now   PRECAUTIONS: activity as tolerated per evaluating PT discussion with surgeon   RED FLAGS: None     WEIGHT BEARING RESTRICTIONS: No  FALLS:  Has patient fallen in last 6 months? No  LIVING ENVIRONMENT: Lives with: lives alone Lives in: House/apartment Stairs: flight inside house, 4STE  Has following equipment at home: Single point cane and Environmental Consultant - 4 wheeled  OCCUPATION: retired   PLOF: Independent, Independent with basic ADLs, Independent with gait, and Independent with transfers  PATIENT GOALS: feel better, be able to get my neck right, get my balance right, no falls   NEXT MD VISIT: Dr. Gust October 30th   OBJECTIVE:  COGNITION: Overall cognitive status: Within functional limits for tasks assessed  SENSATION: Not tested  POSTURE: rounded shoulders, forward head, decreased lumbar lordosis, increased thoracic kyphosis, and flexed trunk   PALPATION:  Multiple severe chains of trigger points B upper traps, paraspinals in cervical and thoracic spine very tight and tender   CERVICAL ROM:   Active ROM A/PROM (deg) eval AROM 08/25/23  Flexion  35*  Extension  3*  Right lateral flexion  10*  Left lateral flexion  10*  Right rotation  27*  Left rotation  18*   (Blank rows = not tested)  Deferred ROM check at eval- will  clarify post-op precautions with surgeon prior to AROM- per functional  observation, extreme limitations all planes of motion C-spine     UPPER EXTREMITY MMT:  MMT Right eval Left eval Right  Left  Right 08/25/23 Left 08/25/23  Shoulder flexion 4 4 4+ 4+ 5 4+  Shoulder extension        Shoulder abduction 4 4 4+ 4+ 5 5  Shoulder adduction        Shoulder extension        Shoulder internal rotation        Shoulder external rotation        Middle trapezius        Lower trapezius        Elbow flexion 4+ 4+   5 5  Elbow extension 4+ 4+   5 5  Wrist flexion        Wrist extension        Wrist ulnar deviation        Wrist radial deviation        Wrist pronation        Wrist supination        Grip strength        Hip flexion  3 3 4 4 3 3   Quads 4 4+ 4+ 4+ 5 5  Hamstrings 4 4 4+ 4 5 5   Ankle dorsiflexion 5 5       (Blank rows = not tested)    FUNCTIONAL TESTS:  Berg Balance Scale: 32; 08/25/23- 41/56,  10/18/23 = 40/56  TUG:  10/18/23 = 19 seconds TODAY'S TREATMENT:                                                                                                                              DATE:  11/20/23 NuStep L5 x 8 min Red tband rows and extension with cues for posture In pbars ball toss, then on airex, then on airex volleyball which is a little more fast paced On airex reaching for numbers on wall W back with back to wall Cone toe touches Cone hand touches Passive stretch to the neck and shoulders STM to the neck and upper traps  11/02/23 Nustep L5 x 6 min UEs/LEs Row red TB 2x10 Shoulder ext red TB 2x10 Shoulder ER red TB 2x10 W red TB 2x10 Cervical retraction 2x10 Sitting hip flexion red TB 2x10 Standing step back/turn x10 Standing tandem stance x30 sec Standing on L LE flexing R LE x10 STM & TPR bilat UTs and cervical paraspinals Trigger Point Dry Needling  Subsequent Treatment: Instructions provided previously at initial dry needling treatment.   Patient Verbal Consent Given: Yes Education Handout Provided: Previously  Provided Muscles Treated: Bilat UT and cervical paraspinals Electrical Stimulation Performed: No Treatment Response/Outcome: Twitch response, decreased muscle tension    10/18/23 Nustep level 5 x 5 minutes In pbars step turn touch [employer], one loss of balance required Mod A to correct fall  Side step on and off airex On airex reaching On airex red tband row and extension STM to the neck/upper traps, gentle stretches  09/29/23 Shrugs Gentle rotations Red tband row Red tband extension Shoulder rolls DN to the upper traps and the cervical area STM to the above  09/21/23 Nustep L6 x 5 min UEs/LEs Modified quadruped thoracic rotation x10 Sitting thoracic rotation 2x5 Sitting thoracic extension with arm support x10 Sitting cervical isometrics rotation 5x5, retraction 5x5, lateral flexion 5x5 STM & TPR bilat UTs, cervical paraspinals Skilled assessment and palpation for TPDN Trigger Point Dry-Needling  Treatment instructions: Expect mild to moderate muscle soreness. S/S of pneumothorax if dry needled over a lung field, and to seek immediate medical attention should they occur. Patient verbalized understanding of these instructions and education.  Patient Consent Given: Yes Education handout provided: Previously provided Muscles treated: Bilat UTs, cervical paraspinals Electrical stimulation performed: No Parameters: N/A Treatment response/outcome: Decreased muscle tension, twitch response   08/31/23 NuStep L5 x 6 min Lead PT assisted in session performing DN to the bilateral upper traps and cervical area Side step on and off airex  Alt 8in box taps from airex 2x10 Side steps on balance bean in // bars  08/25/23 Objective measures for recert, education on goals/progress/POC and will continue dry needling in future sessions UBE backwards only L2-4 (limited by neck pain) x6 minutes Available ROM strengthening: cx flexion red TB x10, lateral flexion x10  B  PATIENT EDUCATION:  Education details: exam findings, POC, HEP, awaiting clarification on precautions prior to focus on cervical spine  Person educated: Patient Education method: Explanation, Demonstration, and Handouts Education comprehension: verbalized understanding, returned demonstration, and needs further education  HOME EXERCISE PROGRAM: Access Code: [email or web address] URL: [email or web address] Date: 06/24/2023 Prepared by: Tanda Sorrow  Exercises - Seated Scapular Retraction  - 2 x daily - 7 x weekly - 1 sets - 10 reps - 2 seconds  hold - Seated Backward Shoulder Rolls  - 2 x daily - 7 x weekly - 1 sets - 10 reps - Semi-Tandem Corner Balance With Eyes Open  - 2 x daily - 7 x weekly - 1 sets - 4 reps - 30 seconds hold - Seated Hip Abduction with Resistance  - 2 x daily - 7 x weekly - 1 sets - 10 reps - 1 second  hold - Standing Shoulder Flexion AAROM with Dowel  - 1 x daily - 7 x weekly - 3 sets - 10 reps - Standing Shoulder Extension with Dowel  - 1 x daily - 7 x weekly - 3 sets - 10 reps - Standing Bilateral Shoulder Internal Rotation AAROM with Dowel  - 1 x daily - 7 x weekly - 3 sets - 10 reps  ASSESSMENT:  CLINICAL IMPRESSION: Patient denies fall, I did question her about the last time me asking her to see a neurologist due to the high number of falls and at times some mental fog.  She does report that she has called her primary MD and is awaiting a referral to neurologist, she did reports that she tried to message in [place] Dr. Ines who is a neurologist, I have also messaged her and have no response.  She did well today with the balance activities  OBJECTIVE IMPAIRMENTS: Abnormal gait, decreased activity tolerance, decreased balance, decreased mobility, difficulty walking, decreased ROM, decreased strength, hypomobility, increased fascial restrictions, increased muscle spasms, impaired flexibility, and pain.   ACTIVITY LIMITATIONS: carrying, lifting, bending,  squatting, stairs, bed mobility, and locomotion  level  PARTICIPATION LIMITATIONS: meal prep, cleaning, laundry, shopping, community activity, and yard work  PERSONAL FACTORS: Age, Behavior pattern, Fitness, Past/current experiences, and Time since onset of injury/illness/exacerbation are also affecting patient's functional outcome.   REHAB POTENTIAL: Good  CLINICAL DECISION MAKING: Stable/uncomplicated  EVALUATION COMPLEXITY: Low   GOALS: Goals reviewed with patient? Yes  SHORT TERM GOALS: Target date: 09/08/2023    Will be compliant with appropriate progressive HEP  Baseline:  Goal status: Met 07/01/23  2.  Cervical ROM to improve by at least 15 degrees all planes of motion (when cleared to work on ROM by careers adviser) Baseline:  Goal status: ONGOING 10/18/23  3.  Will be able to maintain tandem stance for 15 seconds with no more than MinA  Baseline: able to do 10 seconds 10/18/23 Goal status: progressing 10/18/23  4.  Soft tissue restrictions and trigger points to have improved by at least 50% to assist in pain management  Baseline:  Goal status: Met 08/03/23  LONG TERM GOALS: Target date: 12/28/23    MMT to have improved by 1 grade in all weak groups  Baseline:  Goal status: progressing 10/18/23  2.  Will score at least 45 on Berg to show reduced fall risk  Baseline: 2 recent falls Goal status: ONGOING 1/6/3  3.  Pain in cervical spine to be no more than 2/10 at worst  Baseline:  Goal status: progressing 10/18/23  4.  Will be compliant with appropriate gym exercise program to maintain functional gains after DC from PT  Baseline:  Goal status: progressing 10/17/22     PLAN:  PT FREQUENCY: Every other week (limited due to insurance restrictions)  PT DURATION: 8 weeks  PLANNED INTERVENTIONS: Therapeutic exercises, Therapeutic activity, Neuromuscular re-education, Gait training, Self Care, Dry Needling, Cryotherapy, Moist heat, and Manual therapy  PLAN FOR NEXT SESSION:  Needs better balance, still working on neck pain and stiffness and the UE weakness. Need to work on specific cervical strengthening. I requested more visits to help her balance and overall safety, this is visit 2 of 4 by 12/28/23 Ozell Mainland, PT Tanda KANDICE Sorrow, PTA 11/16/23 4:20 PM

## 2023-11-23 ENCOUNTER — Encounter: Payer: Self-pay | Admitting: Physical Therapy

## 2023-11-23 ENCOUNTER — Ambulatory Visit: Payer: Medicare Other | Admitting: Physical Therapy

## 2023-11-23 DIAGNOSIS — R2681 Unsteadiness on feet: Secondary | ICD-10-CM

## 2023-11-23 DIAGNOSIS — M6281 Muscle weakness (generalized): Secondary | ICD-10-CM | POA: Diagnosis not present

## 2023-11-23 DIAGNOSIS — M542 Cervicalgia: Secondary | ICD-10-CM

## 2023-11-23 DIAGNOSIS — R262 Difficulty in walking, not elsewhere classified: Secondary | ICD-10-CM

## 2023-11-23 DIAGNOSIS — M5412 Radiculopathy, cervical region: Secondary | ICD-10-CM

## 2023-11-23 NOTE — Therapy (Signed)
OUTPATIENT PHYSICAL THERAPY CERVICAL TREATMENT Progress Note Reporting Period 08/05/23 to 11/23/23 for visits 11-20  See note below for Objective Data and Assessment of Progress/Goals.      Patient Name: Becky Hobbs MRN: [id number] DOB:[birth year], 78 y.o., female Today's Date: 11/23/2023  END OF SESSION:  PT End of Session - 11/23/23 1606     Visit Number 20    Date for PT Re-Evaluation 12/28/23    Authorization Type [employer] MCR    Authorization Time Period 3 of 4 visits until 12/28/23    PT Start Time 1606    PT Stop Time 1655    PT Time Calculation (min) 49 min    Activity Tolerance Patient tolerated treatment well    Behavior During Therapy WFL for tasks assessed/performed               Past Medical History:  Diagnosis Date   Anemia    Anxiety    panic attacks   Arthritis    Asthma    Blood transfusion    1973--with twins birth   Bronchitis    Chronic kidney disease    Diabetes mellitus    borderline   GERD (gastroesophageal reflux disease)    H/O hiatal hernia    surgery fixed   Headache    migraines   Heart murmur    MVP   Hepatitis    Hep C   Hypertension    Hypothyroidism    Pneumonia    Past Surgical History:  Procedure Laterality Date   ABDOMINAL HYSTERECTOMY     APPENDECTOMY     APPLICATION OF INTRAOPERATIVE CT SCAN N/A 06/05/2021   Procedure: APPLICATION OF INTRAOPERATIVE CT SCAN;  Surgeon: Bethann Goo, DO;  Location: [facility] OR;  Service: Neurosurgery;  Laterality: N/A;   BACK SURGERY     bladder tack     BREAST CYST EXCISION Right    BREAST CYST INCISION AND DRAINAGE     BREAST SURGERY     BUNIONECTOMY     CHOLECYSTECTOMY     DILATION AND CURETTAGE OF UTERUS     ESOPHAGOGASTRODUODENOSCOPY (EGD) WITH PROPOFOL N/A 01/03/2019   Procedure: ESOPHAGOGASTRODUODENOSCOPY (EGD) WITH PROPOFOL;  Surgeon: Kathi Der, MD;  Location: [facility];  Service: Gastroenterology;  Laterality: N/A;   FOREIGN BODY REMOVAL  01/03/2019   Procedure:  FOREIGN BODY REMOVAL;  Surgeon: Kathi Der, MD;  Location: [facility];  Service: Gastroenterology;;   FRACTURE SURGERY     left wrist   HERNIA REPAIR     hiatal hernia   LUMBAR FUSION  07/26/2017   LUMBAR LAMINECTOMY  03/04/2012   Procedure: MICRODISCECTOMY LUMBAR LAMINECTOMY;  Surgeon: Kerrin Champagne, MD;  Location: [facility];  Service: Orthopedics;  Laterality: N/A;  L3-4 central laminectomy   NASAL SEPTUM SURGERY     nissan fundoplication     OVARIAN CYST SURGERY     POSTERIOR CERVICAL FUSION/FORAMINOTOMY N/A 06/05/2021   Procedure: OCCIPITAL- CERVICAL TWO INSTRUMENTATION AND FUSION; EXTENSION TO CERVICAL FIVE;  Surgeon: Bethann Goo, DO;  Location: [facility];  Service: Neurosurgery;  Laterality: N/A;   THORACIC FUSION  07/26/2017   TONSILLECTOMY     Patient Active Problem List   Diagnosis Date Noted   Chronic incomplete quadriplegia (HCC) 08/12/2022   Spasticity 08/12/2022   Chronic kidney disease (CKD), stage IV (severe) (HCC) 08/12/2022   Neurogenic bladder 04/03/2022   Spondylosis, cervical, with myelopathy 04/03/2022   Myofascial pain dysfunction syndrome 12/17/2021   Hepatic cirrhosis (HCC) 06/26/2021  Fracture of C1 vertebra, closed (HCC) 06/09/2021   Closed C1 fracture (HCC) 06/05/2021   Plantar fasciitis of left foot 08/21/2018   Acute renal failure (ARF) (HCC) 02/09/2018   Chronic migraine without aura without status migrainosus, not intractable 06/16/2017   History of lumbar fusion 08/03/2016    Class: Chronic   Migraine 04/09/2016   Acute-on-chronic kidney injury (HCC) 09/07/2015   Hypertension 09/07/2015   Hypothyroidism 09/07/2015   Gout 09/07/2015   Low back pain 09/07/2015   Obesity (BMI 30-39.9) 07/06/2013   Headache 04/12/2013   Concussion 04/12/2013   Lumbar spinal stenosis 03/04/2012    Class: Diagnosis of    PCP: Genice Rouge MD   REFERRING PROVIDER: Patricia Nettle, MD  REFERRING DIAG: Cervical Fusion  THERAPY DIAG:  Muscle weakness  (generalized)  Cervicalgia  Unsteadiness on feet  Difficulty in walking, not elsewhere classified  Radiculopathy, cervical region  Rationale for Evaluation and Treatment: Rehabilitation  ONSET DATE: cervical fusion 02/01/23  SUBJECTIVE:                                                                                                                                                                                                         SUBJECTIVE STATEMENT: Patient reports a near fall, she reports that she did get a referral to see neurologist.  Hand dominance: Right  PERTINENT HISTORY:  Anxiety/panic attacks, OA, CKD, DM, hernia s/p surgical repair, hepatitis, HTN, hypothyroidism, back surgery, bunionectomy, cholecystectomy, L wrist fracture surgery, lumbar fusion, lumbar laminectomy, posterior cervical fusion/foraminotomy, ACDF 4/24  PAIN:  Are you having pain? No 0/10 now   PRECAUTIONS: activity as tolerated per evaluating PT discussion with surgeon   RED FLAGS: None     WEIGHT BEARING RESTRICTIONS: No  FALLS:  Has patient fallen in last 6 months? No  LIVING ENVIRONMENT: Lives with: lives alone Lives in: House/apartment Stairs: flight inside house, 4STE  Has following equipment at home: Single point cane and Environmental consultant - 4 wheeled  OCCUPATION: retired   PLOF: Independent, Independent with basic ADLs, Independent with gait, and Independent with transfers  PATIENT GOALS: feel better, be able to get my neck right, get my balance right, no falls   NEXT MD VISIT: Dr. Noel Gerold October 30th   OBJECTIVE:  COGNITION: Overall cognitive status: Within functional limits for tasks assessed  SENSATION: Not tested  POSTURE: rounded shoulders, forward head, decreased lumbar lordosis, increased thoracic kyphosis, and flexed trunk   PALPATION:  Multiple severe chains of trigger points B upper traps, paraspinals in cervical and thoracic spine very tight and tender  CERVICAL  ROM:   Active ROM A/PROM (deg) eval AROM 08/25/23  Flexion  35*  Extension  3*  Right lateral flexion  10*  Left lateral flexion  10*  Right rotation  27*  Left rotation  18*   (Blank rows = not tested)  Deferred ROM check at eval- will clarify post-op precautions with surgeon prior to AROM- per functional observation, extreme limitations all planes of motion C-spine     UPPER EXTREMITY MMT:  MMT Right eval Left eval Right  Left  Right 08/25/23 Left 08/25/23  Shoulder flexion 4 4 4+ 4+ 5 4+  Shoulder extension        Shoulder abduction 4 4 4+ 4+ 5 5  Shoulder adduction        Shoulder extension        Shoulder internal rotation        Shoulder external rotation        Middle trapezius        Lower trapezius        Elbow flexion 4+ 4+   5 5  Elbow extension 4+ 4+   5 5  Wrist flexion        Wrist extension        Wrist ulnar deviation        Wrist radial deviation        Wrist pronation        Wrist supination        Grip strength        Hip flexion  3 3 4 4 3 3   Quads 4 4+ 4+ 4+ 5 5  Hamstrings 4 4 4+ 4 5 5   Ankle dorsiflexion 5 5       (Blank rows = not tested)    FUNCTIONAL TESTS:  Berg Balance Scale: 32; 08/25/23- 41/56,  10/18/23 = 40/56  TUG:  10/18/23 = 19 seconds TODAY'S TREATMENT:                                                                                                                              DATE:  11/23/23 Nustep level 5 x 8 minutes In pbars step turn reach On airex reaching for numbers on wall On airex ball toss and volley ball Back to wall red tband rows and extension working on postural mms and head up posture 2# Wate bar flexion to work upper trap 3# ball reach overhead Back to wall 6# shrugs and then some cervical stretches within her tolerance due to her limited motions Side stepping, backward walking with light HHA Side step on and off airex Step overs half bolster  11/16/23 NuStep L5 x 8 min Red tband rows and extension with  cues for posture In pbars ball toss, then on airex, then on airex volleyball which is a little more fast paced On airex reaching for numbers on wall W back with back to wall Cone toe touches Cone hand touches Passive stretch  to the neck and shoulders STM to the neck and upper traps  11/02/23 Nustep L5 x 6 min UEs/LEs Row red TB 2x10 Shoulder ext red TB 2x10 Shoulder ER red TB 2x10 "W" red TB 2x10 Cervical retraction 2x10 Sitting hip flexion red TB 2x10 Standing step back/turn x10 Standing tandem stance x30 sec Standing on L LE flexing R LE x10 STM & TPR bilat UTs and cervical paraspinals Trigger Point Dry Needling  Subsequent Treatment: Instructions provided previously at initial dry needling treatment.   Patient Verbal Consent Given: Yes Education Handout Provided: Previously Provided Muscles Treated: Bilat UT and cervical paraspinals Electrical Stimulation Performed: No Treatment Response/Outcome: Twitch response, decreased muscle tension    10/18/23 Nustep level 5 x 5 minutes In pbars step turn touch [employer], one loss of balance required Mod A to correct fall Side step on and off airex On airex reaching On airex red tband row and extension STM to the neck/upper traps, gentle stretches  09/29/23 Shrugs Gentle rotations Red tband row Red tband extension Shoulder rolls DN to the upper traps and the cervical area STM to the above  09/21/23 Nustep L6 x 5 min UEs/LEs Modified quadruped thoracic rotation x10 Sitting thoracic rotation 2x5 Sitting thoracic extension with arm support x10 Sitting cervical isometrics rotation 5x5", retraction 5x5", lateral flexion 5x5" STM & TPR bilat UTs, cervical paraspinals Skilled assessment and palpation for TPDN Trigger Point Dry-Needling  Treatment instructions: Expect mild to moderate muscle soreness. S/S of pneumothorax if dry needled over a lung field, and to seek immediate medical attention should they occur.  Patient verbalized understanding of these instructions and education.  Patient Consent Given: Yes Education handout provided: Previously provided Muscles treated: Bilat UTs, cervical paraspinals Electrical stimulation performed: No Parameters: N/A Treatment response/outcome: Decreased muscle tension, twitch response   08/31/23 NuStep L5 x 6 min Lead PT assisted in session performing DN to the bilateral upper traps and cervical area Side step on and off airex  Alt 8in box taps from airex 2x10 Side steps on balance bean in // bars  08/25/23 Objective measures for recert, education on goals/progress/POC and will continue dry needling in future sessions UBE backwards only L2-4 (limited by neck pain) x6 minutes Available ROM strengthening: cx flexion red TB x10, lateral flexion x10 B  PATIENT EDUCATION:  Education details: exam findings, POC, HEP, awaiting clarification on precautions prior to focus on cervical spine  Person educated: Patient Education method: Explanation, Demonstration, and Handouts Education comprehension: verbalized understanding, returned demonstration, and needs further education  HOME EXERCISE PROGRAM: Access Code: [email or web address] URL: [email or web address] Date: 06/24/2023 Prepared by: Debroah Baller  Exercises - Seated Scapular Retraction  - 2 x daily - 7 x weekly - 1 sets - 10 reps - 2 seconds  hold - Seated Backward Shoulder Rolls  - 2 x daily - 7 x weekly - 1 sets - 10 reps - Semi-Tandem Corner Balance With Eyes Open  - 2 x daily - 7 x weekly - 1 sets - 4 reps - 30 seconds hold - Seated Hip Abduction with Resistance  - 2 x daily - 7 x weekly - 1 sets - 10 reps - 1 second  hold - Standing Shoulder Flexion AAROM with Dowel  - 1 x daily - 7 x weekly - 3 sets - 10 reps - Standing Shoulder Extension with Dowel  - 1 x daily - 7 x weekly - 3 sets - 10 reps - Standing Bilateral Shoulder Internal Rotation AAROM with Dowel  -  1 x daily - 7 x weekly - 3  sets - 10 reps  ASSESSMENT:  CLINICAL IMPRESSION: Patient does report getting the referral for neurologist but again does not have an appointment yet.  I did ask to assure that they look at her brain as we have had worries about the multiple falls and the brain fog and slurring words in the past.  I continued to work on balance higher level activities and work on postural mms to help her head position to see if this helps with balance and overall pain levels  Patient denies falls, I did question her about the last time me asking her to see a neurologist due to the high number of falls and at times some mental fog.  She does report that she has called her primary MD and is awaiting a referral to neurologist, she did reports that she tried to message in [place] Dr. Lucia Gaskins who is a neurologist, I have also messaged her and have no response.   OBJECTIVE IMPAIRMENTS: Abnormal gait, decreased activity tolerance, decreased balance, decreased mobility, difficulty walking, decreased ROM, decreased strength, hypomobility, increased fascial restrictions, increased muscle spasms, impaired flexibility, and pain.   ACTIVITY LIMITATIONS: carrying, lifting, bending, squatting, stairs, bed mobility, and locomotion level  PARTICIPATION LIMITATIONS: meal prep, cleaning, laundry, shopping, community activity, and yard work  PERSONAL FACTORS: Age, Behavior pattern, Fitness, Past/current experiences, and Time since onset of injury/illness/exacerbation are also affecting patient's functional outcome.   REHAB POTENTIAL: Good  CLINICAL DECISION MAKING: Stable/uncomplicated  EVALUATION COMPLEXITY: Low   GOALS: Goals reviewed with patient? Yes  SHORT TERM GOALS: Target date: 09/08/2023    Will be compliant with appropriate progressive HEP  Baseline:  Goal status: Met 07/01/23  2.  Cervical ROM to improve by at least 15 degrees all planes of motion (when cleared to work on ROM by Careers adviser) Baseline:  Goal  status: progressing 11/23/23  3.  Will be able to maintain tandem stance for 15 seconds with no more than MinA  Baseline: able to do 10 seconds 10/18/23 Goal status: met 11/23/23  4.  Soft tissue restrictions and trigger points to have improved by at least 50% to assist in pain management  Baseline:  Goal status: Met 08/03/23  LONG TERM GOALS: Target date: 12/28/23    MMT to have improved by 1 grade in all weak groups  Baseline:  Goal status: progressing 11/23/23  2.  Will score at least 45 on Berg to show reduced fall risk  Baseline: 2 recent falls Goal status: ONGOING 1/6/3  3.  Pain in cervical spine to be no more than 2/10 at worst  Baseline:  Goal status: progressing 10/18/23  4.  Will be compliant with appropriate gym exercise program to maintain functional gains after DC from PT  Baseline:  Goal status: progressing 11/23/23     PLAN:  PT FREQUENCY: Every other week (limited due to insurance restrictions)  PT DURATION: 8 weeks  PLANNED INTERVENTIONS: Therapeutic exercises, Therapeutic activity, Neuromuscular re-education, Gait training, Self Care, Dry Needling, Cryotherapy, Moist heat, and Manual therapy  PLAN FOR NEXT SESSION: Needs better balance, still working on neck pain and stiffness and the UE weakness. Need to work on specific cervical strengthening. I requested more visits to help her balance and overall safety, this is visit 3 of 4 by 12/28/23 Stacie Glaze, PT Jearld Lesch, PT 11/23/23 4:07 PM

## 2023-12-21 ENCOUNTER — Ambulatory Visit: Payer: Medicare Other | Attending: Orthopaedic Surgery | Admitting: Physical Therapy

## 2023-12-21 ENCOUNTER — Encounter: Payer: Self-pay | Admitting: Physical Therapy

## 2023-12-21 DIAGNOSIS — R2681 Unsteadiness on feet: Secondary | ICD-10-CM | POA: Insufficient documentation

## 2023-12-21 DIAGNOSIS — M6281 Muscle weakness (generalized): Secondary | ICD-10-CM | POA: Diagnosis present

## 2023-12-21 DIAGNOSIS — M5412 Radiculopathy, cervical region: Secondary | ICD-10-CM | POA: Insufficient documentation

## 2023-12-21 DIAGNOSIS — R262 Difficulty in walking, not elsewhere classified: Secondary | ICD-10-CM | POA: Insufficient documentation

## 2023-12-21 DIAGNOSIS — M542 Cervicalgia: Secondary | ICD-10-CM | POA: Insufficient documentation

## 2023-12-21 NOTE — Therapy (Signed)
 OUTPATIENT PHYSICAL THERAPY CERVICAL TREATMENT    Patient Name: Becky Hobbs MRN: [id number] DOB:04-Jun-1946, 78 y.o., female Today's Date: 12/21/2023  END OF SESSION:  PT End of Session - 12/21/23 1620     Visit Number 21    Date for PT Re-Evaluation 12/28/23    Authorization Type [employer] MCR    Authorization Time Period 4 of 4    PT Start Time 1613    PT Stop Time 1700    PT Time Calculation (min) 47 min    Activity Tolerance Patient tolerated treatment well    Behavior During Therapy WFL for tasks assessed/performed               Past Medical History:  Diagnosis Date   Anemia    Anxiety    panic attacks   Arthritis    Asthma    Blood transfusion    1973--with twins birth   Bronchitis    Chronic kidney disease    Diabetes mellitus    borderline   GERD (gastroesophageal reflux disease)    H/O hiatal hernia    surgery fixed   Headache    migraines   Heart murmur    MVP   Hepatitis    Hep C   Hypertension    Hypothyroidism    Pneumonia    Past Surgical History:  Procedure Laterality Date   ABDOMINAL HYSTERECTOMY     APPENDECTOMY     APPLICATION OF INTRAOPERATIVE CT SCAN N/A 06/05/2021   Procedure: APPLICATION OF INTRAOPERATIVE CT SCAN;  Surgeon: Dawley, Alan Mulder, DO;  Location: [facility] OR;  Service: Neurosurgery;  Laterality: N/A;   BACK SURGERY     bladder tack     BREAST CYST EXCISION Right    BREAST CYST INCISION AND DRAINAGE     BREAST SURGERY     BUNIONECTOMY     CHOLECYSTECTOMY     DILATION AND CURETTAGE OF UTERUS     ESOPHAGOGASTRODUODENOSCOPY (EGD) WITH PROPOFOL N/A 01/03/2019   Procedure: ESOPHAGOGASTRODUODENOSCOPY (EGD) WITH PROPOFOL;  Surgeon: Kathi Der, MD;  Location: [facility];  Service: Gastroenterology;  Laterality: N/A;   FOREIGN BODY REMOVAL  01/03/2019   Procedure: FOREIGN BODY REMOVAL;  Surgeon: Kathi Der, MD;  Location: [facility];  Service: Gastroenterology;;   FRACTURE SURGERY     left wrist   HERNIA REPAIR      hiatal hernia   LUMBAR FUSION  07/26/2017   LUMBAR LAMINECTOMY  03/04/2012   Procedure: MICRODISCECTOMY LUMBAR LAMINECTOMY;  Surgeon: Kerrin Champagne, MD;  Location: [facility];  Service: Orthopedics;  Laterality: N/A;  L3-4 central laminectomy   NASAL SEPTUM SURGERY     nissan fundoplication     OVARIAN CYST SURGERY     POSTERIOR CERVICAL FUSION/FORAMINOTOMY N/A 06/05/2021   Procedure: OCCIPITAL- CERVICAL TWO INSTRUMENTATION AND FUSION; EXTENSION TO CERVICAL FIVE;  Surgeon: Bethann Goo, DO;  Location: [facility];  Service: Neurosurgery;  Laterality: N/A;   THORACIC FUSION  07/26/2017   TONSILLECTOMY     Patient Active Problem List   Diagnosis Date Noted   Chronic incomplete quadriplegia (HCC) 08/12/2022   Spasticity 08/12/2022   Chronic kidney disease (CKD), stage IV (severe) (HCC) 08/12/2022   Neurogenic bladder 04/03/2022   Spondylosis, cervical, with myelopathy 04/03/2022   Myofascial pain dysfunction syndrome 12/17/2021   Hepatic cirrhosis (HCC) 06/26/2021   Fracture of C1 vertebra, closed (HCC) 06/09/2021   Closed C1 fracture (HCC) 06/05/2021   Plantar fasciitis of left foot 08/21/2018   Acute renal  failure (ARF) (HCC) 02/09/2018   Chronic migraine without aura without status migrainosus, not intractable 06/16/2017   History of lumbar fusion 08/03/2016    Class: Chronic   Migraine 04/09/2016   Acute-on-chronic kidney injury (HCC) 09/07/2015   Hypertension 09/07/2015   Hypothyroidism 09/07/2015   Gout 09/07/2015   Low back pain 09/07/2015   Obesity (BMI 30-39.9) 07/06/2013   Headache 04/12/2013   Concussion 04/12/2013   Lumbar spinal stenosis 03/04/2012    Class: Diagnosis of    PCP: Genice Rouge MD   REFERRING PROVIDER: Patricia Nettle, MD  REFERRING DIAG: Cervical Fusion  THERAPY DIAG:  Muscle weakness (generalized)  Cervicalgia  Unsteadiness on feet  Difficulty in walking, not elsewhere classified  Radiculopathy, cervical region  Rationale for Evaluation and  Treatment: Rehabilitation  ONSET DATE: cervical fusion 02/01/23  SUBJECTIVE:                                                                                                                                                                                                         SUBJECTIVE STATEMENT: Patient tells me that she has not seen the neurologist yet, she reports that she has to get a new referral from the primary care to see the neurologist.  She does report that she has been more diligent with doing her exercises at home, she denies any recent falls  Hand dominance: Right  PERTINENT HISTORY:  Anxiety/panic attacks, OA, CKD, DM, hernia s/p surgical repair, hepatitis, HTN, hypothyroidism, back surgery, bunionectomy, cholecystectomy, L wrist fracture surgery, lumbar fusion, lumbar laminectomy, posterior cervical fusion/foraminotomy, ACDF 4/24  PAIN:  Are you having pain? No 0/10 now   PRECAUTIONS: activity as tolerated per evaluating PT discussion with surgeon   RED FLAGS: None     WEIGHT BEARING RESTRICTIONS: No  FALLS:  Has patient fallen in last 6 months? No  LIVING ENVIRONMENT: Lives with: lives alone Lives in: House/apartment Stairs: flight inside house, 4STE  Has following equipment at home: Single point cane and Environmental consultant - 4 wheeled  OCCUPATION: retired   PLOF: Independent, Independent with basic ADLs, Independent with gait, and Independent with transfers  PATIENT GOALS: feel better, be able to get my neck right, get my balance right, no falls   NEXT MD VISIT: Dr. Noel Gerold October 30th   OBJECTIVE:  COGNITION: Overall cognitive status: Within functional limits for tasks assessed  SENSATION: Not tested  POSTURE: rounded shoulders, forward head, decreased lumbar lordosis, increased thoracic kyphosis, and flexed trunk   PALPATION:  Multiple severe chains of trigger points B upper traps, paraspinals in  cervical and thoracic spine very tight and  tender   CERVICAL ROM:   Active ROM A/PROM (deg) eval AROM 08/25/23  Flexion  35*  Extension  3*  Right lateral flexion  10*  Left lateral flexion  10*  Right rotation  27*  Left rotation  18*   (Blank rows = not tested)  Deferred ROM check at eval- will clarify post-op precautions with surgeon prior to AROM- per functional observation, extreme limitations all planes of motion C-spine     UPPER EXTREMITY MMT:  MMT Right eval Left eval Right  Left  Right 08/25/23 Left 08/25/23  Shoulder flexion 4 4 4+ 4+ 5 4+  Shoulder extension        Shoulder abduction 4 4 4+ 4+ 5 5  Shoulder adduction        Shoulder extension        Shoulder internal rotation        Shoulder external rotation        Middle trapezius        Lower trapezius        Elbow flexion 4+ 4+   5 5  Elbow extension 4+ 4+   5 5  Wrist flexion        Wrist extension        Wrist ulnar deviation        Wrist radial deviation        Wrist pronation        Wrist supination        Grip strength        Hip flexion  3 3 4 4 3 3   Quads 4 4+ 4+ 4+ 5 5  Hamstrings 4 4 4+ 4 5 5   Ankle dorsiflexion 5 5       (Blank rows = not tested)    FUNCTIONAL TESTS:  Berg Balance Scale: 32; 08/25/23- 41/56,  10/18/23 = 40/56  ,  12/21/23 43/56 TUG:  10/18/23 = 19 seconds, 12/21/23 = 16 seconds TODAY'S TREATMENT:                                                                                                                              DATE:  12/21/23 Berg balance = 43/56 3 point improvement in the past 2 months TUG = 16 seconds a 3 second improvement in the past 2 months Nustep level 5 x 5 minutes Gait with patient outside, no device, slow, CGA on curbs and about 3 other times due to minor loss of balance Step turn reach in the pbars Cone toe touches with SPC Red tband rows and extension On airex reaching Side stepping over wate bars  Ball toss  11/23/23 Nustep level 5 x 8 minutes In pbars step turn reach On airex  reaching for numbers on wall On airex ball toss and volley ball Back to wall red tband rows and extension working on postural mms and head up posture 2# Wate bar  flexion to work upper trap 3# ball reach overhead Back to wall 6# shrugs and then some cervical stretches within her tolerance due to her limited motions Side stepping, backward walking with light HHA Side step on and off airex Step overs half bolster  11/16/23 NuStep L5 x 8 min Red tband rows and extension with cues for posture In pbars ball toss, then on airex, then on airex volleyball which is a little more fast paced On airex reaching for numbers on wall W back with back to wall Cone toe touches Cone hand touches Passive stretch to the neck and shoulders STM to the neck and upper traps  11/02/23 Nustep L5 x 6 min UEs/LEs Row red TB 2x10 Shoulder ext red TB 2x10 Shoulder ER red TB 2x10 "W" red TB 2x10 Cervical retraction 2x10 Sitting hip flexion red TB 2x10 Standing step back/turn x10 Standing tandem stance x30 sec Standing on L LE flexing R LE x10 STM & TPR bilat UTs and cervical paraspinals Trigger Point Dry Needling  Subsequent Treatment: Instructions provided previously at initial dry needling treatment.   Patient Verbal Consent Given: Yes Education Handout Provided: Previously Provided Muscles Treated: Bilat UT and cervical paraspinals Electrical Stimulation Performed: No Treatment Response/Outcome: Twitch response, decreased muscle tension    10/18/23 Nustep level 5 x 5 minutes In pbars step turn touch [employer], one loss of balance required Mod A to correct fall Side step on and off airex On airex reaching On airex red tband row and extension STM to the neck/upper traps, gentle stretches PATIENT EDUCATION:  Education details: exam findings, POC, HEP, awaiting clarification on precautions prior to focus on cervical spine  Person educated: Patient Education method: Explanation,  Demonstration, and Handouts Education comprehension: verbalized understanding, returned demonstration, and needs further education  HOME EXERCISE PROGRAM: Access Code: [email or web address] URL: [email or web address] Date: 06/24/2023 Prepared by: Debroah Baller  Exercises - Seated Scapular Retraction  - 2 x daily - 7 x weekly - 1 sets - 10 reps - 2 seconds  hold - Seated Backward Shoulder Rolls  - 2 x daily - 7 x weekly - 1 sets - 10 reps - Semi-Tandem Corner Balance With Eyes Open  - 2 x daily - 7 x weekly - 1 sets - 4 reps - 30 seconds hold - Seated Hip Abduction with Resistance  - 2 x daily - 7 x weekly - 1 sets - 10 reps - 1 second  hold - Standing Shoulder Flexion AAROM with Dowel  - 1 x daily - 7 x weekly - 3 sets - 10 reps - Standing Shoulder Extension with Dowel  - 1 x daily - 7 x weekly - 3 sets - 10 reps - Standing Bilateral Shoulder Internal Rotation AAROM with Dowel  - 1 x daily - 7 x weekly - 3 sets - 10 reps  ASSESSMENT:  CLINICAL IMPRESSION: Patient does report getting the referral for neurologist but again does not have an appointment yet.  She reports today that she had to see her PCP to get the referral, but still no appointment.  She reports that she has been doing her HEP more diligently and I feel that is true as she is moving better, not using an assistive device today, she improved 3 seconds on the TUG and improved 3 points on the Berg balance test score.  I continued to work on balance higher level activities and work on postural mms to help her head position to see if this helps with balance  and overall pain levels.  She is reporting less neck pain overall.  Patient denies falls, I did question her about the last time me asking her to see a neurologist due to the high number of falls and at times some mental fog.  She does report that she has called her primary MD and is awaiting a referral to neurologist, she did reports that she tried to message in [address]  Lucia Gaskins who is a neurologist, I have also messaged her and have no response.   OBJECTIVE IMPAIRMENTS: Abnormal gait, decreased activity tolerance, decreased balance, decreased mobility, difficulty walking, decreased ROM, decreased strength, hypomobility, increased fascial restrictions, increased muscle spasms, impaired flexibility, and pain.   ACTIVITY LIMITATIONS: carrying, lifting, bending, squatting, stairs, bed mobility, and locomotion level  PARTICIPATION LIMITATIONS: meal prep, cleaning, laundry, shopping, community activity, and yard work  PERSONAL FACTORS: Age, Behavior pattern, Fitness, Past/current experiences, and Time since onset of injury/illness/exacerbation are also affecting patient's functional outcome.   REHAB POTENTIAL: Good  CLINICAL DECISION MAKING: Stable/uncomplicated  EVALUATION COMPLEXITY: Low   GOALS: Goals reviewed with patient? Yes  SHORT TERM GOALS: Target date: 09/08/2023    Will be compliant with appropriate progressive HEP  Baseline:  Goal status: Met 07/01/23  2.  Cervical ROM to improve by at least 15 degrees all planes of motion (when cleared to work on ROM by Careers adviser) Baseline:  Goal status: progressing 11/23/23  3.  Will be able to maintain tandem stance for 15 seconds with no more than MinA  Baseline: able to do 10 seconds 10/18/23 Goal status: met 11/23/23  4.  Soft tissue restrictions and trigger points to have improved by at least 50% to assist in pain management  Baseline:  Goal status: Met 08/03/23  LONG TERM GOALS: Target date: 12/28/23    MMT to have improved by 1 grade in all weak groups  Baseline:  Goal status: progressing 12/21/23  2.  Will score at least 45 on Berg to show reduced fall risk  Baseline: 2 recent falls Goal status: progressing 43/56 on 12/21/23  3.  Pain in cervical spine to be no more than 2/10 at worst  Baseline:  Goal status: progressing 12/21/23  4.  Will be compliant with appropriate gym exercise program to  maintain functional gains after DC from PT  Baseline:  Goal status: progressing 12/21/23 now compliant with HEP   PLAN:  PT FREQUENCY: Every other week (limited due to insurance restrictions)  PT DURATION: 8 weeks  PLANNED INTERVENTIONS: Therapeutic exercises, Therapeutic activity, Neuromuscular re-education, Gait training, Self Care, Dry Needling, Cryotherapy, Moist heat, and Manual therapy  PLAN FOR NEXT SESSION: Today was visit 4 of 4 from her insurance, she has improved with less pain, better mobility, with decreased TUG time and with an improvement in her berg balance score by 3 points.  We were trying to get her in to see the neurologist and she has yet to get in to see this MD, she assures me that she is working on it.  She is improving so we could continue our current plan with her doing more at home  Stacie Glaze, PT Jearld Lesch, PT 12/21/23 4:23 PM

## 2023-12-27 ENCOUNTER — Other Ambulatory Visit: Payer: Self-pay | Admitting: Family Medicine

## 2023-12-27 DIAGNOSIS — R2689 Other abnormalities of gait and mobility: Secondary | ICD-10-CM

## 2023-12-31 ENCOUNTER — Ambulatory Visit
Admission: RE | Admit: 2023-12-31 | Discharge: 2023-12-31 | Disposition: A | Discharge status: Home / Self Care | Source: Ambulatory Visit | Attending: Family Medicine | Admitting: Family Medicine

## 2023-12-31 DIAGNOSIS — R2689 Other abnormalities of gait and mobility: Secondary | ICD-10-CM

## 2024-03-03 ENCOUNTER — Other Ambulatory Visit: Payer: Self-pay | Admitting: Nurse Practitioner

## 2024-03-03 DIAGNOSIS — K7469 Other cirrhosis of liver: Secondary | ICD-10-CM

## 2024-03-07 ENCOUNTER — Ambulatory Visit
Admission: RE | Admit: 2024-03-07 | Discharge: 2024-03-07 | Discharge status: Home / Self Care | Source: Ambulatory Visit | Attending: Nurse Practitioner

## 2024-03-07 DIAGNOSIS — K7469 Other cirrhosis of liver: Secondary | ICD-10-CM

## 2024-04-18 ENCOUNTER — Encounter: Payer: Self-pay | Admitting: Neurology

## 2024-04-18 ENCOUNTER — Ambulatory Visit: Admitting: Neurology

## 2024-04-18 VITALS — BP 125/70 | HR 80 | Ht 64.0 in | Wt 172.0 lb

## 2024-04-18 DIAGNOSIS — R29898 Other symptoms and signs involving the musculoskeletal system: Secondary | ICD-10-CM

## 2024-04-18 DIAGNOSIS — M5442 Lumbago with sciatica, left side: Secondary | ICD-10-CM | POA: Diagnosis not present

## 2024-04-18 DIAGNOSIS — R296 Repeated falls: Secondary | ICD-10-CM | POA: Diagnosis not present

## 2024-04-18 DIAGNOSIS — R200 Anesthesia of skin: Secondary | ICD-10-CM | POA: Diagnosis not present

## 2024-04-18 DIAGNOSIS — R269 Unspecified abnormalities of gait and mobility: Secondary | ICD-10-CM

## 2024-04-18 NOTE — Patient Instructions (Addendum)
 MRi lumbar spine for low back pain, left leg weakness contributing to instability and falls which radiculopathy
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
## 2024-04-20 ENCOUNTER — Telehealth: Payer: Self-pay | Admitting: Neurology

## 2024-04-20 NOTE — Telephone Encounter (Signed)
 no auth required sent to GI [phone]

## 2024-05-05 ENCOUNTER — Ambulatory Visit
Admission: RE | Admit: 2024-05-05 | Discharge: 2024-05-05 | Disposition: A | Discharge status: Home / Self Care | Source: Ambulatory Visit | Attending: Neurology | Admitting: Neurology

## 2024-05-05 DIAGNOSIS — R269 Unspecified abnormalities of gait and mobility: Secondary | ICD-10-CM

## 2024-05-05 DIAGNOSIS — R29898 Other symptoms and signs involving the musculoskeletal system: Secondary | ICD-10-CM

## 2024-05-05 DIAGNOSIS — M5442 Lumbago with sciatica, left side: Secondary | ICD-10-CM

## 2024-05-05 DIAGNOSIS — R200 Anesthesia of skin: Secondary | ICD-10-CM

## 2024-05-05 DIAGNOSIS — R296 Repeated falls: Secondary | ICD-10-CM

## 2024-05-08 ENCOUNTER — Ambulatory Visit: Payer: Self-pay | Admitting: Neurology

## 2024-05-08 DIAGNOSIS — R200 Anesthesia of skin: Secondary | ICD-10-CM

## 2024-05-08 DIAGNOSIS — R269 Unspecified abnormalities of gait and mobility: Secondary | ICD-10-CM

## 2024-05-08 DIAGNOSIS — R296 Repeated falls: Secondary | ICD-10-CM

## 2024-05-08 DIAGNOSIS — M5442 Lumbago with sciatica, left side: Secondary | ICD-10-CM

## 2024-05-08 DIAGNOSIS — R29898 Other symptoms and signs involving the musculoskeletal system: Secondary | ICD-10-CM

## 2024-05-08 NOTE — Telephone Encounter (Signed)
 I called patient. I discussed her MRI lumbar results with her. She is agreeable to an NCV/EMG. Our office will call her to schedule this. Pt verbalized understanding of results. Pt had no questions at this time but was encouraged to call back if questions arise.

## 2024-05-08 NOTE — Telephone Encounter (Signed)
-----   Message from Onetha KATHEE Epp sent at 05/08/2024 12:58 PM EDT ----- The MRI lumbar spine was difficult to read due to the artifact from the hardware, I recommend emg/ncs if patient is amenable please ask her and order thanks ----- Message ----- From: Rosemarie Eather RAMAN, MD Sent: 05/06/2024  11:45 AM EDT To: Onetha KATHEE Epp, MD

## 2024-07-06 ENCOUNTER — Encounter: Admitting: Neurology

## 2024-07-25 ENCOUNTER — Ambulatory Visit: Admitting: Family Medicine

## 2024-08-10 ENCOUNTER — Other Ambulatory Visit: Payer: Self-pay | Admitting: Internal Medicine

## 2024-08-10 DIAGNOSIS — Z1231 Encounter for screening mammogram for malignant neoplasm of breast: Secondary | ICD-10-CM

## 2024-08-17 ENCOUNTER — Telehealth: Payer: Self-pay | Admitting: Neurology

## 2024-08-17 NOTE — Telephone Encounter (Signed)
 Pt called to r/s NCS

## 2024-08-24 ENCOUNTER — Encounter: Admitting: Neurology

## 2024-08-31 ENCOUNTER — Other Ambulatory Visit: Payer: Self-pay | Admitting: Nurse Practitioner

## 2024-08-31 DIAGNOSIS — K7469 Other cirrhosis of liver: Secondary | ICD-10-CM

## 2024-09-11 ENCOUNTER — Ambulatory Visit

## 2024-09-12 ENCOUNTER — Ambulatory Visit
Admission: RE | Admit: 2024-09-12 | Discharge: 2024-09-12 | Disposition: A | Source: Ambulatory Visit | Attending: Nurse Practitioner

## 2024-09-12 DIAGNOSIS — K7469 Other cirrhosis of liver: Secondary | ICD-10-CM

## 2024-10-10 ENCOUNTER — Ambulatory Visit
Admission: RE | Admit: 2024-10-10 | Discharge: 2024-10-10 | Disposition: A | Discharge status: Home / Self Care | Source: Ambulatory Visit | Attending: Internal Medicine | Admitting: Internal Medicine

## 2024-10-10 DIAGNOSIS — Z1231 Encounter for screening mammogram for malignant neoplasm of breast: Secondary | ICD-10-CM

## 2024-11-01 ENCOUNTER — Telehealth: Payer: Self-pay | Admitting: Neurology

## 2024-11-01 ENCOUNTER — Encounter: Admitting: Neurology

## 2024-11-01 NOTE — Telephone Encounter (Signed)
 Pt called back and r/s the NCV

## 2024-11-01 NOTE — Telephone Encounter (Signed)
 Patient notified got ready to come for appointment and had a flat tire. Transferred patient to Billing to pay no show fee $50

## 2025-01-10 ENCOUNTER — Encounter: Admitting: Neurology
